# Patient Record
Sex: Male | Born: 1945 | Race: White | Hispanic: No | Marital: Married | Smoking: Never smoker
Health system: Southern US, Community
[De-identification: ages and names within clinical notes are randomized; demographics above are authoritative.]

## PROBLEM LIST (undated history)

## (undated) DIAGNOSIS — K219 Gastro-esophageal reflux disease without esophagitis: Secondary | ICD-10-CM

## (undated) DIAGNOSIS — R569 Unspecified convulsions: Secondary | ICD-10-CM

## (undated) DIAGNOSIS — H269 Unspecified cataract: Secondary | ICD-10-CM

## (undated) DIAGNOSIS — T7840XA Allergy, unspecified, initial encounter: Secondary | ICD-10-CM

## (undated) DIAGNOSIS — H409 Unspecified glaucoma: Secondary | ICD-10-CM

## (undated) DIAGNOSIS — H3530 Unspecified macular degeneration: Secondary | ICD-10-CM

## (undated) DIAGNOSIS — Z973 Presence of spectacles and contact lenses: Secondary | ICD-10-CM

## (undated) DIAGNOSIS — Z9289 Personal history of other medical treatment: Secondary | ICD-10-CM

## (undated) DIAGNOSIS — E785 Hyperlipidemia, unspecified: Secondary | ICD-10-CM

## (undated) DIAGNOSIS — M1990 Unspecified osteoarthritis, unspecified site: Secondary | ICD-10-CM

## (undated) DIAGNOSIS — I639 Cerebral infarction, unspecified: Secondary | ICD-10-CM

## (undated) DIAGNOSIS — Z923 Personal history of irradiation: Secondary | ICD-10-CM

## (undated) HISTORY — PX: BRAIN SURGERY: SHX531

## (undated) HISTORY — PX: TONSILLECTOMY: SUR1361

## (undated) HISTORY — PX: COLONOSCOPY: SHX174

## (undated) HISTORY — PX: PILONIDAL CYST EXCISION: SHX744

## (undated) HISTORY — DX: Unspecified convulsions: R56.9

## (undated) HISTORY — DX: Allergy, unspecified, initial encounter: T78.40XA

## (undated) HISTORY — DX: Unspecified cataract: H26.9

## (undated) HISTORY — PX: VASECTOMY: SHX75

## (undated) HISTORY — PX: EYE SURGERY: SHX253

## (undated) HISTORY — DX: Hyperlipidemia, unspecified: E78.5

## (undated) HISTORY — PX: DIAGNOSTIC LAPAROSCOPY: SUR761

---

## 2005-10-13 ENCOUNTER — Ambulatory Visit: Payer: Self-pay | Admitting: Internal Medicine

## 2006-12-22 HISTORY — PX: CHOLECYSTECTOMY: SHX55

## 2007-09-01 ENCOUNTER — Ambulatory Visit: Payer: Self-pay | Admitting: Internal Medicine

## 2007-09-01 DIAGNOSIS — K759 Inflammatory liver disease, unspecified: Secondary | ICD-10-CM | POA: Insufficient documentation

## 2007-09-01 DIAGNOSIS — R1013 Epigastric pain: Secondary | ICD-10-CM | POA: Insufficient documentation

## 2007-09-01 LAB — CONVERTED CEMR LAB
HCV Ab: NEGATIVE
Hep A IgM: NEGATIVE
Hep B C IgM: NEGATIVE
Hepatitis B Surface Ag: NEGATIVE

## 2007-09-02 ENCOUNTER — Encounter: Payer: Self-pay | Admitting: Internal Medicine

## 2007-09-03 ENCOUNTER — Ambulatory Visit: Payer: Self-pay | Admitting: Internal Medicine

## 2007-09-03 ENCOUNTER — Telehealth: Payer: Self-pay | Admitting: Internal Medicine

## 2007-09-03 LAB — CONVERTED CEMR LAB
ALT: 753 units/L — ABNORMAL HIGH (ref 0–53)
AST: 239 units/L — ABNORMAL HIGH (ref 0–37)
Albumin: 3.9 g/dL (ref 3.5–5.2)
Alkaline Phosphatase: 201 units/L — ABNORMAL HIGH (ref 39–117)
BUN: 9 mg/dL (ref 6–23)
Basophils Relative: 0.4 % (ref 0.0–1.0)
Bilirubin, Direct: 4.4 mg/dL — ABNORMAL HIGH (ref 0.0–0.3)
CO2: 28 meq/L (ref 19–32)
Calcium: 9.4 mg/dL (ref 8.4–10.5)
Chloride: 104 meq/L (ref 96–112)
Creatinine, Ser: 0.8 mg/dL (ref 0.4–1.5)
Eosinophils Relative: 1.1 % (ref 0.0–5.0)
GFR calc Af Amer: 127 mL/min
GFR calc non Af Amer: 105 mL/min
Glucose, Bld: 106 mg/dL — ABNORMAL HIGH (ref 70–99)
HCT: 45.3 % (ref 39.0–52.0)
Hemoglobin: 15.4 g/dL (ref 13.0–17.0)
Lymphocytes Relative: 20.8 % (ref 12.0–46.0)
MCHC: 34 g/dL (ref 30.0–36.0)
MCV: 90.4 fL (ref 78.0–100.0)
Monocytes Relative: 9.1 % (ref 3.0–11.0)
Neutrophils Relative %: 68.6 % (ref 43.0–77.0)
Platelets: 171 10*3/uL (ref 150–400)
Potassium: 3.7 meq/L (ref 3.5–5.1)
RBC: 5.01 M/uL (ref 4.22–5.81)
RDW: 12.6 % (ref 11.5–14.6)
Sodium: 140 meq/L (ref 135–145)
Total Bilirubin: 7.1 mg/dL — ABNORMAL HIGH (ref 0.3–1.2)
Total Protein: 7 g/dL (ref 6.0–8.3)
WBC: 6.3 10*3/uL (ref 4.5–10.5)

## 2007-09-04 LAB — CONVERTED CEMR LAB
ALT: 576 units/L — ABNORMAL HIGH (ref 0–53)
AST: 182 units/L — ABNORMAL HIGH (ref 0–37)
Albumin: 3.7 g/dL (ref 3.5–5.2)
Alkaline Phosphatase: 217 units/L — ABNORMAL HIGH (ref 39–117)
Bilirubin, Direct: 2.4 mg/dL — ABNORMAL HIGH (ref 0.0–0.3)
Cholesterol: 238 mg/dL (ref 0–200)
Direct LDL: 155.4 mg/dL
HDL: 54.8 mg/dL (ref >39.0)
Total Bilirubin: 4.9 mg/dL — ABNORMAL HIGH (ref 0.3–1.2)
Total CHOL/HDL Ratio: 4.3
Total Protein: 6.8 g/dL (ref 6.0–8.3)
Triglycerides: 95 mg/dL (ref 0–149)
VLDL: 19 mg/dL (ref 0–40)

## 2007-09-06 ENCOUNTER — Encounter (INDEPENDENT_AMBULATORY_CARE_PROVIDER_SITE_OTHER): Payer: Self-pay | Admitting: *Deleted

## 2007-09-06 ENCOUNTER — Telehealth: Payer: Self-pay | Admitting: Internal Medicine

## 2007-09-06 ENCOUNTER — Ambulatory Visit: Payer: Self-pay | Admitting: Internal Medicine

## 2007-09-06 DIAGNOSIS — R740 Nonspecific elevation of levels of transaminase and lactic acid dehydrogenase [LDH]: Secondary | ICD-10-CM

## 2007-09-06 DIAGNOSIS — R7402 Elevation of levels of lactic acid dehydrogenase (LDH): Secondary | ICD-10-CM | POA: Insufficient documentation

## 2007-09-06 DIAGNOSIS — R7401 Elevation of levels of liver transaminase levels: Secondary | ICD-10-CM | POA: Insufficient documentation

## 2007-09-06 LAB — CONVERTED CEMR LAB
ALT: 700 units/L — ABNORMAL HIGH (ref 0–53)
AST: 258 units/L — ABNORMAL HIGH (ref 0–37)
Albumin: 3.7 g/dL (ref 3.5–5.2)
Alkaline Phosphatase: 241 units/L — ABNORMAL HIGH (ref 39–117)
Bilirubin, Direct: 1.7 mg/dL — ABNORMAL HIGH (ref 0.0–0.3)
Total Bilirubin: 4.3 mg/dL — ABNORMAL HIGH (ref 0.3–1.2)
Total Protein: 6.8 g/dL (ref 6.0–8.3)

## 2007-09-17 ENCOUNTER — Encounter: Admission: RE | Admit: 2007-09-17 | Discharge: 2007-09-17 | Payer: Self-pay | Admitting: Family Medicine

## 2007-09-20 ENCOUNTER — Telehealth (INDEPENDENT_AMBULATORY_CARE_PROVIDER_SITE_OTHER): Payer: Self-pay | Admitting: *Deleted

## 2007-09-20 ENCOUNTER — Encounter (INDEPENDENT_AMBULATORY_CARE_PROVIDER_SITE_OTHER): Payer: Self-pay | Admitting: *Deleted

## 2007-09-22 ENCOUNTER — Ambulatory Visit: Payer: Self-pay | Admitting: Internal Medicine

## 2007-09-22 LAB — CONVERTED CEMR LAB
ALT: 56 units/L — ABNORMAL HIGH (ref 0–53)
AST: 25 units/L (ref 0–37)
Albumin: 3.9 g/dL (ref 3.5–5.2)
Alkaline Phosphatase: 111 units/L (ref 39–117)
Bilirubin, Direct: 0.5 mg/dL — ABNORMAL HIGH (ref 0.0–0.3)
Total Bilirubin: 1.4 mg/dL — ABNORMAL HIGH (ref 0.3–1.2)
Total Protein: 6.8 g/dL (ref 6.0–8.3)

## 2007-10-01 ENCOUNTER — Telehealth (INDEPENDENT_AMBULATORY_CARE_PROVIDER_SITE_OTHER): Payer: Self-pay | Admitting: *Deleted

## 2007-10-01 DIAGNOSIS — K8020 Calculus of gallbladder without cholecystitis without obstruction: Secondary | ICD-10-CM | POA: Insufficient documentation

## 2007-11-16 ENCOUNTER — Encounter (INDEPENDENT_AMBULATORY_CARE_PROVIDER_SITE_OTHER): Payer: Self-pay | Admitting: General Surgery

## 2007-11-17 ENCOUNTER — Inpatient Hospital Stay (HOSPITAL_COMMUNITY): Admission: AD | Admit: 2007-11-17 | Discharge: 2007-11-18 | Payer: Self-pay | Admitting: General Surgery

## 2007-11-17 ENCOUNTER — Encounter: Payer: Self-pay | Admitting: Internal Medicine

## 2007-11-22 ENCOUNTER — Ambulatory Visit: Payer: Self-pay | Admitting: Internal Medicine

## 2007-11-30 ENCOUNTER — Encounter: Payer: Self-pay | Admitting: Internal Medicine

## 2007-11-30 ENCOUNTER — Encounter: Admission: RE | Admit: 2007-11-30 | Discharge: 2007-11-30 | Payer: Self-pay | Admitting: General Surgery

## 2007-12-10 ENCOUNTER — Encounter: Payer: Self-pay | Admitting: Internal Medicine

## 2007-12-26 ENCOUNTER — Encounter: Payer: Self-pay | Admitting: Internal Medicine

## 2007-12-26 DIAGNOSIS — R930 Abnormal findings on diagnostic imaging of skull and head, not elsewhere classified: Secondary | ICD-10-CM | POA: Insufficient documentation

## 2008-02-29 DIAGNOSIS — H3530 Unspecified macular degeneration: Secondary | ICD-10-CM | POA: Insufficient documentation

## 2008-02-29 DIAGNOSIS — T7840XA Allergy, unspecified, initial encounter: Secondary | ICD-10-CM | POA: Insufficient documentation

## 2008-05-12 ENCOUNTER — Encounter (INDEPENDENT_AMBULATORY_CARE_PROVIDER_SITE_OTHER): Payer: Self-pay | Admitting: *Deleted

## 2008-05-18 ENCOUNTER — Telehealth: Payer: Self-pay | Admitting: Internal Medicine

## 2011-01-12 ENCOUNTER — Encounter: Payer: Self-pay | Admitting: Internal Medicine

## 2011-01-21 NOTE — Progress Notes (Signed)
Summary: FYI  Phone Note Call from Patient   Caller: Patient Summary of Call: dr. Zriyah Kopplin I set the patient up for a follow up ct scan and had to leave a message on his voicemail for him to return my call. When the patient called me back he informed me that he will be moving so he was going to get his records and then have his new doctor follow up on the scan.  Initial call taken by: Charolette Child,  May 18, 2008 8:52 AM  Follow-up for Phone Call         I must concur with  his decision Follow-up by: Marga Melnick MD,  May 19, 2008 9:32 AM

## 2011-01-21 NOTE — Letter (Signed)
Summary: Results Follow up Letter  [facility] [place]  [address] [place], [place] [address]   Phone: [phone]  Fax: [phone]    09/06/2007 MRN: [id number]  James Olsen [address] [place], [place]  [address]  Dear Mr. Klausner,  The following are the results of your recent test(s):  Test         Result    Pap Smear:        Normal _____  Not Normal _____ Comments: ______________________________________________________ Cholesterol: LDL(Bad cholesterol):         Your goal is less than:         HDL (Good cholesterol):       Your goal is more than: Comments:  ______________________________________________________ Mammogram:        Normal _____  Not Normal _____ Comments:  ___________________________________________________________________ Hemoccult:        Normal _____  Not normal _______ Comments:    _____________________________________________________________________ Other Tests:  Please see attached results and comments   We routinely do not discuss normal results over the telephone.  If you desire a copy of the results, or you have any questions about this information we can discuss them at your next office visit.   Sincerely,

## 2011-01-21 NOTE — Letter (Signed)
Summary: [facility]   Imported By: Freddy Jaksch 12/13/2007 11:52:37  _____________________________________________________________________  External Attachment:    Type:   Image     Comment:   External Document  Appended Document: [facility] see CT scan 12/09/08tiny nodules present. No PMH CA &  nonsmoker. Plan repeat CT in 6/09 to verify stability.

## 2011-01-21 NOTE — Procedures (Signed)
Summary: ERCP  ERCP   Imported By: Freddy Jaksch 12/01/2007 10:37:13  _____________________________________________________________________  External Attachment:    Type:   Image     Comment:   INTER

## 2011-01-21 NOTE — Progress Notes (Signed)
Summary: ? re appt - dr hopper  Phone Note Call from Patient Call back at [phone]   Summary of Call: patient returned dr hoppers call he wants to go ahead with consult regarding findings from ultra sound - he is also scheduled to see dr Elenore Paddy & wants to know if he should keep that appt   ---- he can also be reached at [phone] ext [phone]   Initial call taken by: Okey Regal Spring,  September 20, 2007 3:16 PM  Follow-up for Phone Call        Dr Marvell Fuller opinion prior to Surgical consult would be appropriate; but I doubt non surgical approach possible for gallstones Follow-up by: Marga Melnick MD,  September 20, 2007 6:27 PM  Additional Follow-up for Phone Call Additional follow up Details #1::        CALLED AND SP / PT--TOLD HIM PER DR HOPP GI CONSULT BEFORE SURG CONSULT WOUT BE APPROPRIATE.Marland KitchenKENNETH Olsen IS OK W/ THAT....................................................................Marland KitchenDaine Gip  September 21, 2007 12:40 PM Additional Follow-up by: Daine Gip,  September 21, 2007 12:40 PM

## 2011-01-21 NOTE — Letter (Signed)
Summary: External Correspondence-note  External Correspondence-note   Imported By: Vanessa Swaziland 09/10/2007 10:48:26  _____________________________________________________________________  External Attachment:    Type:   Image     Comment:   External Document

## 2011-01-21 NOTE — Letter (Signed)
Summary: Primary Care Consult Scheduled Letter  [facility] [place]  [address] [place], [place] [address]   Phone: [phone]  Fax: [phone]      05/12/2008 MRN: [id number]  James Olsen [address] [place], [place]  [address]    Dear Mr. Lagares,      We have scheduled an appointment for you.  At the recommendation of Dr.Hopper, we have scheduled you a CT scan on June 5th at 9:00am.  Their address is [address]. The office phone number is [phone].  If this appointment day and time is not convenient for you, please feel free to call the office of the doctor you are being referred to at the number listed above and reschedule the appointment.     It is important for you to keep your scheduled appointments. We are here to make sure you are given good patient care. If you have questions or you have made changes to your appointment, please notify us at  [phone], ask for Tiffany.    Thank you,  Patient Care Coordinator [place] [facility]

## 2011-01-21 NOTE — Progress Notes (Signed)
Summary: LETTER FROM PT  Phone Note Call from Patient   Caller: Patient Summary of Call: PT CAN IN THE OFFICE THIS MORNING AND DROPPED OFF A LETTER FOR DR.Ziggy Chanthavong PULLED CHART AND GIVEN TO DR.Hanley Rispoli Initial call taken by: Vanessa Swaziland,  September 06, 2007 8:51 AM  Follow-up for Phone Call        chart put with letter and given to dr Acelyn Basham Follow-up by: Wendall Stade,  September 06, 2007 4:01 PM

## 2011-01-21 NOTE — Assessment & Plan Note (Signed)
Summary: stomach pain//tl   Vital Signs:  Patient Profile:   65 Years Old Male Weight:      201 pounds Temp:     98.2 degrees F oral BP sitting:   130 / 88  (left arm)  Pt. in pain?   no  Vitals Entered By: Doristine Devoid (September 01, 2007 10:43 AM)                  Chief Complaint:  IBS flare-up that is different and urine a little darker than normal and Abdominal pain.  History of Present Illness:  Abdominal Pain      This is a 65 year old man who presents with Abdominal pain.  nausea & ? fever 08/31/07; sx began 08/29/07; sx resolved with liquid Tylenol except for intermittent abd pain; dicyclomine & BM helped but dicyclomine year out of date.  The patient reports constipation and anorexia, but denies nausea, vomiting, diarrhea, melena, hematochezia, and hematemesis.  The location of the pain is epigastric.  The pain is described as intermittent and dull.  Associated symptoms include dark urine.  The patient denies the following symptoms: fever, weight loss, dysuria, chest pain, and jaundice.  The pain is worse with movement and lying down.  The pain is better with defecation.  Stool lighter since 08/31/07.    Past Medical History:    IBS  Past Surgical History:    Colonoscopy age 64    Pilonidal cystectomy X 2    Tonsillectomy    Vasectomy    Risk Factors:  Tobacco use:  never Alcohol use:  yes    Type:  rare Exercise:  yes    Type:  occa walk   Review of Systems  CV      Denies chest pain or discomfort, difficulty breathing at night, difficulty breathing while lying down, swelling of feet, and swelling of hands.  Resp      Denies cough, shortness of breath, and sputum productive.  GI      See HPI      Complains of change in bowel habits.      Denies yellowish skin color.  GU      Denies discharge, dysuria, hematuria, and urinary frequency.      no pyuria   Physical Exam  General:     Well-developed,well-nourished,in no acute distress;  alert,appropriate and cooperative throughout examination Eyes:     Scleral icterus Lungs:     Normal respiratory effort, chest expands symmetrically. Lungs are clear to auscultation, no crackles or wheezes. Heart:     Normal rate and regular rhythm. S1 and S2 normal without gallop, murmur, click, rub or other extra sounds. Abdomen:     Bowel sounds positive,abdomen soft and non-tender without masses, organomegaly or hernias noted. Not tender even with percussion Skin:     Slightly yellow skin(faint) Cervical Nodes:     No lymphadenopathy noted Axillary Nodes:     No palpable lymphadenopathy Psych:     normally interactive, good eye contact, and not anxious appearing.      Impression & Recommendations:  Problem # 1:  ABDOMINAL PAIN, EPIGASTRIC (ICD-789.06)  Orders: TLB-BMP (Basic Metabolic Panel-BMET) (80048-METABOL) TLB-CBC Platelet - w/Differential (85025-CBCD) TLB-Hepatic/Liver Function Pnl (80076-HEPATIC) TLB-Udip w/ Micro (81001-URINE) T-Hepatitis Acute Panel ([id number])   Problem # 2:  HEPATITIS NOS (ICD-573.3)  Orders: TLB-BMP (Basic Metabolic Panel-BMET) (80048-METABOL) TLB-CBC Platelet - w/Differential (85025-CBCD) TLB-Hepatic/Liver Function Pnl (80076-HEPATIC) TLB-Udip w/ Micro (81001-URINE) T-Hepatitis Acute Panel ([id number])    Patient Instructions:  1)  Avoid excess Tylenol, Vit A, & alcohol. Remain out work 9/ 10;11; & 12.    ]  Appended Document: stomach pain//tl   Orders Added: 1)  T-Culture, Urine [[id number]]   Laboratory Results   Urine Tests  Date/Time Recieved: ..................................................................Marland KitchenFloydene Flock  September 01, 2007 2:37 PM   Routine Urinalysis   Color: orange Appearance: Clear Glucose: negative   (Normal Range: Negative) Bilirubin: negative   (Normal Range: Negative) Ketone: negative   (Normal Range: Negative) Spec. Gravity: <1.005   (Normal Range: 1.003-1.035) Blood: negative    (Normal Range: Negative) pH: 5.0   (Normal Range: 5.0-8.0) Protein: negative   (Normal Range: Negative) Urobilinogen: negative   (Normal Range: 0-1) Nitrite: negative   (Normal Range: Negative) Leukocyte Esterace: negative   (Normal Range: Negative)    Comments: urine sent for culture

## 2011-01-21 NOTE — Letter (Signed)
Summary: Results Follow up Letter  [facility] [place]  [address] [place], [place] [address]   Phone: [phone]  Fax: [phone]    09/20/2007 MRN: [id number]  [facility] [address] [place], [place]  [address]  Dear James Olsen,  The following are the results of your recent test(s):  Test         Result    Pap Smear:        Normal _____  Not Normal _____ Comments: ______________________________________________________ Cholesterol: LDL(Bad cholesterol):         Your goal is less than:         HDL (Good cholesterol):       Your goal is more than: Comments:  ______________________________________________________ Mammogram:        Normal _____  Not Normal _____ Comments:  ___________________________________________________________________ Hemoccult:        Normal _____  Not normal _______ Comments:    _____________________________________________________________________ Other Tests:  Please see attached results and comments   We routinely do not discuss normal results over the telephone.  If you desire a copy of the results, or you have any questions about this information we can discuss them at your next office visit.   Sincerely,

## 2011-01-21 NOTE — Miscellaneous (Signed)
Summary: Orders Update  Clinical Lists Changes  Problems: Added new problem of ELEVATION, TRANSAMINASE/LDH LEVELS (ICD-790.4) Orders: Added new Referral order of Gastroenterology Referral (GI) - Signed Added new Referral order of Radiology Referral (Radiology) - Signed

## 2011-01-21 NOTE — Progress Notes (Signed)
Summary: HEPATIC TEST PREFORMED  Phone Note Outgoing Call   Call placed by: [facility] DAVIS Summary of Call: Home# [phone] THE HEPATIC TEST WAS PREFORMED- NO RESULTS AS OF YET.  PT CALLED THE OFFICE AT 4:00PM TO SEE IF WE HAVE RESULTS- TOLD PT IS ANY PROBLEM, DR Emmilia Sowder WILL CALL.  TOLD PT IF HE NEED AN OV- SOMEONE WOULD BE IN CONTACT W/ HIM.  PT SAYS IF NOT AVAIL TO LEAVE MESSAGE ON ANS [facility] AT HOME # Initial call taken by: Daine Gip,  September 03, 2007 5:00 PM  Follow-up for Phone Call        3:21 am I'll call as soon as results back Follow-up by: Marga Melnick MD,  September 04, 2007 3:21 AM

## 2011-01-21 NOTE — Miscellaneous (Signed)
Summary: Orders Update  Clinical Lists Changes  Problems: Added new problem of NONSPECIFIC ABNORM FIND RAD&OTH EXAM LUNG FIELD (ICD-793.1) Orders: Added new Test order of CT without Contrast (CT w/o contrast) - Signed

## 2011-01-21 NOTE — Progress Notes (Signed)
Summary: gallbladder surg consult-DR HOPPER SEE PLEASE  Phone Note Call from Patient Call back at Home Phone [phone]   Caller: Patient Summary of Call: patient was under the assumption he was going to be scheduled or referred to a surgeon for galbladder consult  Initial call taken by: Okey Regal Spring,  October 01, 2007 9:54 AM  Follow-up for Phone Call        spoke with pt said gallbladder consult was discussed and pt wants to have it. saw dr Leone Payor 09/22/07 Follow-up by: Kandice Hams,  October 01, 2007 10:46 AM  Additional Follow-up for Phone Call Additional follow up Details #1::        Appt Scheduled Additional Follow-up by: Gwen Pounds,  October 01, 2007 3:26 PM  New Problems: GALLSTONES (ICD-574.20)   New Problems: GALLSTONES (ICD-574.20)

## 2011-05-06 NOTE — Op Note (Signed)
James Olsen, MCNIEL NO.:  [id number]   MEDICAL RECORD NO.:  [id number]          PATIENT TYPE:  OIB   LOCATION:  5727                         FACILITY:  [facility]   PHYSICIAN:  Cherylynn Ridges, M.D.    DATE OF BIRTH:  08-May-1946   DATE OF PROCEDURE:  11/16/2007  DATE OF DISCHARGE:                               OPERATIVE REPORT   PREOPERATIVE DIAGNOSIS:  Symptomatic gallstones with  choledocholithiasis.   POSTOPERATIVE DIAGNOSIS:  Chronic cholecystitis, cholelithiasis and  choledocholithiasis.   PROCEDURE:  Laparoscopic cholecystectomy with intraoperative  cholangiogram.   SURGEON:  Cherylynn Ridges, M.D.   ASSISTANT:  Magnus Ivan, RNFA.   ANESTHESIA:  General endotracheal.   ESTIMATED BLOOD LOSS:  Less than 30 mL.   COMPLICATIONS:  None.   CONDITION:  Stable.   SPECIMENS:  Gallbladder plus stones.   FINDINGS:  Chronic cholecystitis with common bile duct stones x1 or 2  based on intraoperative cholangiogram.   INDICATIONS FOR OPERATION:  The patient is a 65 year old who had an  episode of choledocholithiasis and elevated bilirubin and abdominal pain  whose symptoms have subsided but now comes in for an elective  laparoscopic cholecystectomy.   FINDINGS:  The patient had significant evidence of chronic cholecystitis  with inflammatory changes of the gallbladder.  The infundibulum of the  gallbladder was elongated.  The cystic duct was enlarged.  Cholangiogram  showed at least 1, maybe 2, common bile duct stones, one of which had  floated proximal to the cystic duct origin on the last cholangiogram.   OPERATION:  The patient was taken to the operating room, placed on the  table in the supine position.  After an adequate endotracheal anesthetic  was administered, he was prepped and draped in the usual sterile manner  including the midline and the right upper quadrant.   A supraumbilical curvilinear incision was made using a #11 blade and  taken down to  the midline fascia.  The fascia was exposed and a  longitudinal incision made using a #15 blade.  The edges of the fascia  were grabbed with a Kocher clamp and then we bluntly dissected down into  the peritoneal cavity using a Kelly clamp.  We then passed a pursestring  suture of 0 Vicryl around the fascial opening and then passed a Hasson  cannula into the peritoneal cavity with minimal difficulty.   Once the Hasson cannula was secured in place with the pursestring  suture, we insufflated carbon dioxide gas up to a maximal intraabdominal  pressure of 15 mmHg.  Two right costal margin 5-mm cannulas and a  subxiphoid 11-12 mm cannula were passed under direct vision.  With all  cannulas in place, the patient was placed in reverse Trendelenburg.  The  left side was tilted down.   There were significant adhesions to the body of the gallbladder as we  retracted it towards the right upper quadrant and the anterior abdominal  wall.  These were bluntly dissected away with a grasper and also  electrocautery, exposing the initial portion of the infundibulum.  We  dissected out this portion of  the infundibulum.  Once we had gotten  circumferentially around it, we ligated what was thought to be a cystic  artery in the window of the triangle of Calot and hepatoduodenal  triangle.  We then placed a clip on the gallbladder side and made a  cholecystocolotomy and then passed a Cook catheter into the  cholecystocolotomy and performed a cholangiogram.  This showed that we  were actually high up on the gallbladder into the distal portion of the  infundibulum with flow into the cystic duct where there appeared to be  an entrapped cystic duct stone.   Once this cholangiogram was complete, we removed the catheter.  Then we  dissected more distally down the infundibulum onto the proximal cystic  duct.  We were able to find a more adequate window proximal to the  common bile duct and more distal on the  infundibulum.  However, at that  point the cystic duct was still enlarged and big.  We placed the clip  again along the gallbladder side and made a cholecystocolotomy and then  performed a cholangiogram again with the [facility] catheter, which showed  again what appeared to be a common bile duct stone which floated into  the distal common bile duct.  We then removed the [facility] catheter and  passed a red rubber catheter through an angiocatheter to the anterior  abdominal wall.  I attempted to retrieve the stone by inflating the  balloon in the cystic duct and the common bile duct.  We were able to  pass the stone proximal to the junction of the cystic duct.  We could  not bring it out through the cystic duct itself.  An intraoperative  consultation from Dr. Jeani Hawking, gastroenterologist, was made and he  will likely do a postoperative ERCP.   Once we have completed the last cholangiogram with the red rubber  catheter in place which showed good flow into the duodenum and a  floating common bile duct stone, we removed the catheter and placed 2  clips along the distal cystic duct.  However, because of its enlarged  size, we did use an Endoloop distal to the clips in order to secure the  cystic duct opening and prevent a leak.  No drains were left in place.  We then used an endo hook to dissect out the gallbladder from its bed  with minimal difficulty and retreated from the supraumbilical site with  an EndoCatch bag.  We irrigated with about 1.5-2 L of warm saline  solution where there was no evidence of any bile leakage.  No bleeding  from the gallbladder bed.  All counts were correct.  We aspirated all  fluid and gas from about the liver as we removed all cannulas.   The supraumbilical fascial site was closed using pursestring suture  which was in place.  Then the skin at the supraumbilical and subxiphoid  sites were closed using running subcuticular stitch of 4-0 Vicryl.  Dermabond was  applied at the skin in all levels and closing the lateral  cannula sites.  Steri-Strips and Tegaderm were used on the skin.  All  needle counts, sponge counts and instrument counts were correct.      Cherylynn Ridges, M.D.  Electronically Signed     JOW/MEDQ  D:  11/16/2007  T:  11/17/2007  Job:  [id number]   cc:   Dr. Doran Stabler D. Elnoria Howard, MD

## 2011-05-06 NOTE — Assessment & Plan Note (Signed)
[facility]                         GASTROENTEROLOGY OFFICE NOTE   SEVAN, MCBROOM                    MRN:          [id number]  DATE:09/22/2007                            DOB:          1946/10/25    REFERRING PHYSICIAN:  Titus Dubin. Hopper, MD,FACP,FCCP   REASON FOR CONSULTATION:  Abdominal pain and fluctuating liver function  tests.   ASSESSMENT:  A 65 year old white man who has had spells of abdominal  pain in the epigastrium off and on for a number of years.  More  recently, he developed jaundice, light-colored stools, and dark urine  and was found to have gallstones on an ultrasound.  Hepatitis screening  was negative, he tells me.   I think his problem is symptomatic cholelithiasis.  His common bile duct  is about 7 mm.  It is possible he had choledocholithiasis on top of his  cholelithiasis.  He is improved at this time.   PLAN:  1. Agree with surgical consultation that has been ordered.  2. Recheck LFTs.  3. If his LFTs remain up, it may be that he needs a preoperative ERCP,      depending upon what the surgeon thinks.  4. He should have a colonoscopy in October 2009 which is about 10      years from the time of his last one (for screening).   I did explain the situation to him and gave him handouts regarding  gallstones, laparoscopic cholecystectomy, and endoscopic retrograde  cholangiopancreatography.  I have reviewed the risks, benefits, and  indications of ERCP as well.   HISTORY:  As above.  He saw Dr. Alwyn Ren recently after developing some  signs and symptoms of jaundice.  Please see my medical history form for  full details.  He developed the relatively sudden onset of upper  abdominal pain similar to but not quite the same as things he has had  off and on over the years that have been diagnosed as irritable bowel  syndrome.  He had a bilirubin of 4.3, an ALT of 700, alk phos 241, AST  248.  He has not really ever had  jaundice before.  The ultrasound of the  abdomen was ordered.  I reviewed that report, and it demonstrates  multiple mobile gallstones, borderline thickened gallbladder wall, no  Murphy sign, and a borderline dilated common bile duct with slight  intrahepatic biliary ductal prominence at 7 mm.  He does have  echogenicity consistent with fatty liver.  Again, he is back to normal  at this time.   MEDICATIONS:  Vitamins for macular degeneration daily.   No known drug allergies   PAST MEDICAL HISTORY:  1. Macular degeneration.  2. Allergies.  3. Pilonidal cyst surgery.  4. Tonsillectomy.  5. Vasectomy.   FAMILY HISTORY:  Diabetes and breast cancer in his mother.  No colon  cancer or other problems.   SOCIAL HISTORY:  He is married.  He moved here from [place] about 4 years  ago.  He is a Loss adjuster, chartered.  About 2 alcoholic beverages a month,  nothing more.  He has a bachelor's of  fine arts.  No tobacco, no drugs.   REVIEW OF SYSTEMS:  Otherwise negative at this time.   PHYSICAL EXAM:  Height 5 feet 9 inches, weight 199 pounds, blood  pressure 112/78, pulse 76.  EYES:  Anicteric.  ENT:  Normal mouth, posterior pharynx.  NECK:  Supple, no thyromegaly or mass.  CHEST:  Clear.  HEART:  S1, S2.  I hear no murmurs, rubs, or gallops.  ABDOMEN:  Soft, nontender.  No organomegaly or mass.  LYMPHATICS:  No neck or supraclavicular nodes.  EXTREMITIES:  No edema.  SKIN:  No rash.  PSYCH:  He is alert and oriented x3.   I have reviewed the ultrasound report, the labs, Dr. Frederik Pear office  notes.   I appreciate the opportunity to care for this patient.     Iva Boop, MD,FACG  Electronically Signed    CEG/MedQ  DD: 09/22/2007  DT: 09/23/2007  Job #: [id number]   cc:   Titus Dubin. Alwyn Ren, MD,FACP,FCCP  [facility]

## 2011-09-30 LAB — COMPREHENSIVE METABOLIC PANEL
ALT: 145 — ABNORMAL HIGH
ALT: 25
AST: 20
AST: 62 — ABNORMAL HIGH
Albumin: 3 — ABNORMAL LOW
Albumin: 4.1
Alkaline Phosphatase: 74
Alkaline Phosphatase: 76
BUN: 14
BUN: 7
CO2: 27
CO2: 29
Calcium: 8.7
Calcium: 9.6
Chloride: 103
Chloride: 104
Creatinine, Ser: 0.73
Creatinine, Ser: 0.86
GFR calc Af Amer: >60
GFR calc Af Amer: >60
GFR calc non Af Amer: >60
GFR calc non Af Amer: >60
Glucose, Bld: 120 — ABNORMAL HIGH
Glucose, Bld: 94
Potassium: 3.5
Potassium: 4
Sodium: 138
Sodium: 139
Total Bilirubin: 0.8
Total Bilirubin: 0.9
Total Protein: 5.3 — ABNORMAL LOW
Total Protein: 6.5

## 2011-09-30 LAB — CBC
HCT: 37.8 — ABNORMAL LOW
HCT: 44.6
Hemoglobin: 12.9 — ABNORMAL LOW
Hemoglobin: 15.3
MCHC: 34.2
MCHC: 34.2
MCV: 88.6
MCV: 89.5
Platelets: 154
Platelets: 187
RBC: 4.22
RBC: 5.04
RDW: 12.8
RDW: 13.3
WBC: 5.6
WBC: 7.6

## 2011-09-30 LAB — DIFFERENTIAL
Basophils Absolute: 0
Basophils Absolute: 0
Basophils Relative: 0
Basophils Relative: 1
Eosinophils Absolute: 0.1 — ABNORMAL LOW
Eosinophils Absolute: 0.1 — ABNORMAL LOW
Eosinophils Relative: 1
Eosinophils Relative: 3
Lymphocytes Relative: 15
Lymphocytes Relative: 36
Lymphs Abs: 1.1
Lymphs Abs: 2
Monocytes Absolute: 0.6
Monocytes Absolute: 0.7
Monocytes Relative: 11
Monocytes Relative: 9
Neutro Abs: 2.8
Neutro Abs: 5.7
Neutrophils Relative %: 50
Neutrophils Relative %: 75

## 2014-04-28 ENCOUNTER — Other Ambulatory Visit: Payer: Self-pay | Admitting: Orthopedic Surgery

## 2014-05-02 ENCOUNTER — Encounter (HOSPITAL_BASED_OUTPATIENT_CLINIC_OR_DEPARTMENT_OTHER): Payer: Self-pay | Admitting: *Deleted

## 2014-05-03 NOTE — H&P (Signed)
  James Olsen is an 68 y.o. male.   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  This was treated in your office with digital block, debridement and attempted nail ablation.  James Olsen has gone on to heal his wound, but has an irregular nail remnant which is bothersome to him.     Past Medical History  Diagnosis Date  . Glaucoma   . Arthritis   . GERD (gastroesophageal reflux disease)   . Wears glasses     Past Surgical History  Procedure Laterality Date  . Cholecystectomy  2008    lap choli  . Vasotomy    . Pilonidal cyst excision      x2  . Colonoscopy    . Tonsillectomy      History reviewed. No pertinent family history. Social History:  reports that he has never smoked. He does not have any smokeless tobacco history on file. He reports that he drinks alcohol. He reports that he does not use illicit drugs.  Allergies: No Known Allergies  No prescriptions prior to admission    No results found for this or any previous visit (from the past 48 hour(s)).  No results found.   Pertinent items are noted in HPI.  Height 5' 9.5" (1.765 m), weight 88.451 kg (195 lb).  General appearance: alert Head: Normocephalic, without obvious abnormality Neck: supple, symmetrical, trachea midline Resp: clear to auscultation bilaterally Cardio: regular rate and rhythm GI: normal findings: bowel sounds normal Extremities:  Inspection of his finger reveals an irregular tip with bulbous skin flaps radial and ulnar.  He has about 25% of his nail plate remaining.  It is quite irregular and projecting above the nail fold.  He frequently snags this on his sleeves. He has active motion of the DIP joint, 0 to 30 degrees. He still has some flexor tendon attachment and has normal extensor attachment.  He has full range  of motion of his PIP and MP.  His sensibility is returning to the fingertip.   X-rays of his finger demonstrate that he has lost the distal diaphysis, metaphysis and tuft of his distal phalanx.  He has good skin coverage.     Pulses: 2+ and symmetric Skin: normal Neurologic: Grossly normal    Assessment/Plan Impression:  S/P partial amputation tip left index finger.  Plan: To the OR for left index finger nail ablation with V-Y flap coverage.The procedure, risks,benefits and post-op course were discussed with the patient at length and they were in agreement with the plan.  Marily Lente Dasnoit 05/03/2014, 3:21 PM  H&P documentation: 05/04/2014  -History and Physical Reviewed  -Patient has been re-examined  -No change in the plan of care  [name], MD

## 2014-05-04 ENCOUNTER — Encounter (HOSPITAL_BASED_OUTPATIENT_CLINIC_OR_DEPARTMENT_OTHER)
Admission: RE | Disposition: A | Discharge status: Home / Self Care | Payer: Self-pay | Source: Ambulatory Visit | Attending: Orthopedic Surgery

## 2014-05-04 ENCOUNTER — Encounter (HOSPITAL_BASED_OUTPATIENT_CLINIC_OR_DEPARTMENT_OTHER): Payer: Self-pay

## 2014-05-04 ENCOUNTER — Ambulatory Visit (HOSPITAL_BASED_OUTPATIENT_CLINIC_OR_DEPARTMENT_OTHER): Payer: Medicare Other | Admitting: Anesthesiology

## 2014-05-04 ENCOUNTER — Encounter (HOSPITAL_BASED_OUTPATIENT_CLINIC_OR_DEPARTMENT_OTHER): Payer: Medicare Other | Admitting: Anesthesiology

## 2014-05-04 ENCOUNTER — Ambulatory Visit (HOSPITAL_BASED_OUTPATIENT_CLINIC_OR_DEPARTMENT_OTHER)
Admission: RE | Admit: 2014-05-04 | Discharge: 2014-05-04 | Disposition: A | Discharge status: Home / Self Care | Payer: Medicare Other | Source: Ambulatory Visit | Attending: Orthopedic Surgery | Admitting: Orthopedic Surgery

## 2014-05-04 DIAGNOSIS — H409 Unspecified glaucoma: Secondary | ICD-10-CM | POA: Insufficient documentation

## 2014-05-04 DIAGNOSIS — K219 Gastro-esophageal reflux disease without esophagitis: Secondary | ICD-10-CM | POA: Insufficient documentation

## 2014-05-04 DIAGNOSIS — M129 Arthropathy, unspecified: Secondary | ICD-10-CM | POA: Insufficient documentation

## 2014-05-04 DIAGNOSIS — L608 Other nail disorders: Secondary | ICD-10-CM | POA: Insufficient documentation

## 2014-05-04 HISTORY — PX: NAILBED REPAIR: SHX5028

## 2014-05-04 HISTORY — DX: Presence of spectacles and contact lenses: Z97.3

## 2014-05-04 HISTORY — DX: Unspecified osteoarthritis, unspecified site: M19.90

## 2014-05-04 HISTORY — DX: Gastro-esophageal reflux disease without esophagitis: K21.9

## 2014-05-04 HISTORY — DX: Unspecified glaucoma: H40.9

## 2014-05-04 SURGERY — REPAIR, NAIL BED
Anesthesia: Monitor Anesthesia Care | Site: Finger | Laterality: Left

## 2014-05-04 MED ORDER — MIDAZOLAM HCL 2 MG/2ML IJ SOLN
INTRAMUSCULAR | Status: AC
  Filled 2014-05-04: qty 2

## 2014-05-04 MED ORDER — MEPERIDINE HCL 25 MG/ML IJ SOLN: 6.2500 mg | INTRAMUSCULAR | Status: DC | PRN

## 2014-05-04 MED ORDER — HYDROMORPHONE HCL PF 1 MG/ML IJ SOLN: 0.2500 mg | INTRAMUSCULAR | Status: DC | PRN

## 2014-05-04 MED ORDER — CHLORHEXIDINE GLUCONATE 4 % EX LIQD: 60.0000 mL | Freq: Once | CUTANEOUS | Status: DC

## 2014-05-04 MED ORDER — MIDAZOLAM HCL 2 MG/2ML IJ SOLN: 1.0000 mg | INTRAMUSCULAR | Status: DC | PRN

## 2014-05-04 MED ORDER — ONDANSETRON HCL 4 MG/2ML IJ SOLN: 4.0000 mg | Freq: Once | INTRAMUSCULAR | Status: DC | PRN

## 2014-05-04 MED ORDER — OXYCODONE HCL 5 MG PO TABS: 5.0000 mg | ORAL_TABLET | Freq: Once | ORAL | Status: DC | PRN

## 2014-05-04 MED ORDER — FENTANYL CITRATE 0.05 MG/ML IJ SOLN
INTRAMUSCULAR | Status: AC
  Filled 2014-05-04: qty 2

## 2014-05-04 MED ORDER — CEPHALEXIN 500 MG PO CAPS: 500.0000 mg | ORAL_CAPSULE | Freq: Three times a day (TID) | ORAL | Status: DC

## 2014-05-04 MED ORDER — FENTANYL CITRATE 0.05 MG/ML IJ SOLN
INTRAMUSCULAR | Status: DC | PRN
  Administered 2014-05-04: 100 ug via INTRAVENOUS

## 2014-05-04 MED ORDER — FENTANYL CITRATE 0.05 MG/ML IJ SOLN: 50.0000 ug | INTRAMUSCULAR | Status: DC | PRN

## 2014-05-04 MED ORDER — PROPOFOL INFUSION 10 MG/ML OPTIME
INTRAVENOUS | Status: DC | PRN
  Administered 2014-05-04: 100 ug/kg/min via INTRAVENOUS

## 2014-05-04 MED ORDER — MIDAZOLAM HCL 5 MG/5ML IJ SOLN
INTRAMUSCULAR | Status: DC | PRN
  Administered 2014-05-04: 2 mg via INTRAVENOUS

## 2014-05-04 MED ORDER — ONDANSETRON HCL 4 MG/2ML IJ SOLN
INTRAMUSCULAR | Status: DC | PRN
  Administered 2014-05-04: 4 mg via INTRAVENOUS

## 2014-05-04 MED ORDER — LACTATED RINGERS IV SOLN
INTRAVENOUS | Status: DC
  Administered 2014-05-04: 09:00:00 via INTRAVENOUS

## 2014-05-04 MED ORDER — LIDOCAINE HCL (PF) 1 % IJ SOLN
INTRAMUSCULAR | Status: DC | PRN
  Administered 2014-05-04: 4 mL

## 2014-05-04 MED ORDER — OXYCODONE-ACETAMINOPHEN 5-325 MG PO TABS: ORAL_TABLET | ORAL | Status: DC

## 2014-05-04 MED ORDER — OXYCODONE HCL 5 MG/5ML PO SOLN: 5.0000 mg | Freq: Once | ORAL | Status: DC | PRN

## 2014-05-04 SURGICAL SUPPLY — 56 items
BANDAGE ADH SHEER 1  50/CT (GAUZE/BANDAGES/DRESSINGS) IMPLANT
BANDAGE COBAN STERILE 2 (GAUZE/BANDAGES/DRESSINGS) ×2 IMPLANT
BLADE 15 SAFETY STRL DISP (BLADE) IMPLANT
BLADE MINI RND TIP GREEN BEAV (BLADE) IMPLANT
BLADE SURG 15 STRL LF DISP TIS (BLADE) ×2 IMPLANT
BLADE SURG 15 STRL SS (BLADE) ×2
BNDG COHESIVE 1X5 TAN STRL LF (GAUZE/BANDAGES/DRESSINGS) ×2 IMPLANT
BNDG CONFORM 2 STRL LF (GAUZE/BANDAGES/DRESSINGS) ×2 IMPLANT
BNDG CONFORM 3 STRL LF (GAUZE/BANDAGES/DRESSINGS) IMPLANT
BNDG ESMARK 4X9 LF (GAUZE/BANDAGES/DRESSINGS) ×2 IMPLANT
BRUSH SCRUB EZ PLAIN DRY (MISCELLANEOUS) ×2 IMPLANT
CORDS BIPOLAR (ELECTRODE) ×2 IMPLANT
COVER MAYO STAND STRL (DRAPES) ×2 IMPLANT
COVER TABLE BACK 60X90 (DRAPES) ×2 IMPLANT
CUFF TOURNIQUET SINGLE 18IN (TOURNIQUET CUFF) ×2 IMPLANT
DECANTER SPIKE VIAL GLASS SM (MISCELLANEOUS) IMPLANT
DRAIN PENROSE 1/2X12 LTX STRL (WOUND CARE) ×2 IMPLANT
DRAPE EXTREMITY T 121X128X90 (DRAPE) ×2 IMPLANT
DRAPE SURG 17X23 STRL (DRAPES) ×2 IMPLANT
DRSG EMULSION OIL 3X3 NADH (GAUZE/BANDAGES/DRESSINGS) ×2 IMPLANT
GAUZE SPONGE 4X4 12PLY STRL (GAUZE/BANDAGES/DRESSINGS) IMPLANT
GAUZE XEROFORM 1X8 LF (GAUZE/BANDAGES/DRESSINGS) ×2 IMPLANT
GLOVE BIO SURGEON STRL SZ 6.5 (GLOVE) ×2 IMPLANT
GLOVE BIOGEL M STRL SZ7.5 (GLOVE) IMPLANT
GLOVE BIOGEL PI IND STRL 7.0 (GLOVE) ×1 IMPLANT
GLOVE BIOGEL PI INDICATOR 7.0 (GLOVE) ×1
GLOVE ECLIPSE 6.5 STRL STRAW (GLOVE) ×2 IMPLANT
GLOVE ORTHO TXT STRL SZ7.5 (GLOVE) ×2 IMPLANT
GOWN STRL REUS W/ TWL LRG LVL3 (GOWN DISPOSABLE) ×1 IMPLANT
GOWN STRL REUS W/ TWL XL LVL3 (GOWN DISPOSABLE) ×1 IMPLANT
GOWN STRL REUS W/TWL LRG LVL3 (GOWN DISPOSABLE) ×1
GOWN STRL REUS W/TWL XL LVL3 (GOWN DISPOSABLE) ×1
LOOP VESSEL MAXI BLUE (MISCELLANEOUS) IMPLANT
NEEDLE 27GAX1X1/2 (NEEDLE) ×2 IMPLANT
NEEDLE HYPO 25X1 1.5 SAFETY (NEEDLE) ×2 IMPLANT
NS IRRIG 1000ML POUR BTL (IV SOLUTION) ×2 IMPLANT
PACK BASIN DAY SURGERY FS (CUSTOM PROCEDURE TRAY) ×2 IMPLANT
PAD CAST 3X4 CTTN HI CHSV (CAST SUPPLIES) IMPLANT
PADDING CAST ABS 4INX4YD NS (CAST SUPPLIES)
PADDING CAST ABS COTTON 4X4 ST (CAST SUPPLIES) IMPLANT
PADDING CAST COTTON 3X4 STRL (CAST SUPPLIES)
SLEEVE SCD COMPRESS KNEE MED (MISCELLANEOUS) ×2 IMPLANT
STOCKINETTE 4X48 STRL (DRAPES) ×2 IMPLANT
SUT CHROMIC 5 0 P 3 (SUTURE) IMPLANT
SUT CHROMIC 6 0 CE2 363 13 (SUTURE) IMPLANT
SUT ETHILON 5 0 P 3 18 (SUTURE)
SUT NYLON ETHILON 5-0 P-3 1X18 (SUTURE) IMPLANT
SUT PROLENE 3 0 PS 2 (SUTURE) IMPLANT
SUT PROLENE 4 0 P 3 18 (SUTURE) ×2 IMPLANT
SUT VIC AB 4-0 P2 18 (SUTURE) IMPLANT
SYR 3ML 23GX1 SAFETY (SYRINGE) IMPLANT
SYR BULB 3OZ (MISCELLANEOUS) ×2 IMPLANT
SYR CONTROL 10ML LL (SYRINGE) ×2 IMPLANT
TOWEL OR 17X24 6PK STRL BLUE (TOWEL DISPOSABLE) ×4 IMPLANT
TRAY DSU PREP LF (CUSTOM PROCEDURE TRAY) ×2 IMPLANT
UNDERPAD 30X30 INCONTINENT (UNDERPADS AND DIAPERS) ×2 IMPLANT

## 2014-05-04 NOTE — Transfer of Care (Signed)
Immediate Anesthesia Transfer of Care Note  Patient: James Olsen  Procedure(s) Performed: Procedure(s) with comments: LEFT INDEX NAIL ABLATION V-Y FLAP COVERAGE (Left) - index  Patient Location: PACU  Anesthesia Type:MAC  Level of Consciousness: awake, alert  and oriented  Airway & Oxygen Therapy: Patient Spontanous Breathing and Patient connected to face mask oxygen  Post-op Assessment: Report given to PACU RN and Post -op Vital signs reviewed and stable  Post vital signs: Reviewed and stable  Complications: No apparent anesthesia complications

## 2014-05-04 NOTE — Anesthesia Preprocedure Evaluation (Signed)
Anesthesia Evaluation  Patient identified by MRN, date of birth, ID band Patient awake    Reviewed: Allergy & Precautions, H&P , NPO status , Patient's Chart, lab work & pertinent test results  Airway Mallampati: I TM Distance: >3 FB Neck ROM: Full    Dental   Pulmonary          Cardiovascular     Neuro/Psych    GI/Hepatic GERD-  Medicated and Controlled,(+) Hepatitis -  Endo/Other    Renal/GU      Musculoskeletal   Abdominal   Peds  Hematology   Anesthesia Other Findings   Reproductive/Obstetrics                           Anesthesia Physical Anesthesia Plan  ASA: II  Anesthesia Plan: General   Post-op Pain Management:    Induction: Intravenous  Airway Management Planned: LMA  Additional Equipment:   Intra-op Plan:   Post-operative Plan: Extubation in OR  Informed Consent: I have reviewed the patients History and Physical, chart, labs and discussed the procedure including the risks, benefits and alternatives for the proposed anesthesia with the patient or authorized representative who has indicated his/her understanding and acceptance.     Plan Discussed with: CRNA and Surgeon  Anesthesia Plan Comments:         Anesthesia Quick Evaluation

## 2014-05-04 NOTE — Anesthesia Postprocedure Evaluation (Signed)
Anesthesia Post Note  Patient: James Olsen  Procedure(s) Performed: Procedure(s) (LRB): LEFT INDEX NAIL ABLATION V-Y FLAP COVERAGE (Left)  Anesthesia type: general  Patient location: PACU  Post pain: Pain level controlled  Post assessment: Patient's Cardiovascular Status Stable  Last Vitals:  Filed Vitals:   05/04/14 1210  BP: 137/88  Pulse: 66  Temp: 36.5 C  Resp: 16    Post vital signs: Reviewed and stable  Level of consciousness: sedated  Complications: No apparent anesthesia complications

## 2014-05-04 NOTE — Brief Op Note (Signed)
05/04/2014  10:46 AM  PATIENT:  James Olsen  68 y.o. male  PRE-OPERATIVE DIAGNOSIS:  left Index nail dystrophy, painful amputation stump  POST-OPERATIVE DIAGNOSIS:  left index nail dystrophy, painful amputation stump  PROCEDURE:  Procedure(s) with comments: LEFT INDEX NAIL ABLATION V-Y FLAP COVERAGE (Left) - index  SURGEON:  Surgeon(s) and Role:    * [name]., MD - Primary  PHYSICIAN ASSISTANT:   ASSISTANTS: nurse   ANESTHESIA:   MAC  EBL:  Total I/O In: 600 [I.V.:600] Out: -   BLOOD ADMINISTERED:none  DRAINS: none   LOCAL MEDICATIONS USED:  LIDOCAINE   SPECIMEN:  No Specimen  DISPOSITION OF SPECIMEN:  N/A  COUNTS:  YES  TOURNIQUET:    DICTATION: .Other Dictation: Dictation Number [id number]  PLAN OF CARE: Discharge to home after PACU  PATIENT DISPOSITION:  PACU - hemodynamically stable.   Delay start of Pharmacological VTE agent (>24hrs) due to surgical blood loss or risk of bleeding: not applicable

## 2014-05-04 NOTE — Op Note (Signed)
[id number] 

## 2014-05-04 NOTE — Discharge Instructions (Addendum)

## 2014-05-05 NOTE — Op Note (Signed)
NAMEMarland Kitchen  Olsen, James NO.:  [id number]  MEDICAL RECORD NO.:  [id number]  LOCATION:                                 FACILITY:  PHYSICIAN:  Youlanda Mighty. Abdimalik Mayorquin, M.D.      DATE OF BIRTH:  DATE OF PROCEDURE:  05/04/2014 DATE OF DISCHARGE:                              OPERATIVE REPORT   PREOPERATIVE DIAGNOSIS:  Left index nail horn deformity status post partial amputation of left index finger in a chipper accident on December 07, 2014.  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  OPERATION: 1. Resection of dorsal, intermediate and ventral nail fold for     permanent eradication of left index finger nail. 2. Resection of redundant tissues followed by a tailored flap     advancement coverage of left index finger distal phalanx remnant.  OPERATING SURGEON:  Youlanda Mighty. Robbi Scurlock, MD.  ASSISTANT:  Nurse.  ANESTHESIA:  2% lidocaine metacarpal head level block of left index finger supplemented by IV sedation.  SUPERVISING ANESTHESIOLOGIST:  Crissie Sickles. Conrad [place], M.D.  INDICATIONS:  James Olsen is a 68 year old gentleman referred through the courtesy of Dr. Kathryne Eriksson of [facility] for management of dystrophic left index fingernail and uncomfortable pulp status post a chipper injury sustained on December 07, 2013.  Mr. James Olsen sustained a partial amputation and was treated as an outpatient by Dr. Redmond Pulling.  Under digital block anesthesia, his wound was very appropriately cleaned, debrided, and repaired.  Mr. James Olsen had some remnants of nail mechanism present and developed a nail horn that was quite problematic.  This was prominent and would snag on his sleeves when donning a shirt.  He also had discomfort where he had an invaginated hypertrophic scar at the pulp.  Dr. Redmond Pulling referred Mr. James Olsen for hand surgery consult requesting revision of the finger tip.  Mr.  James Olsen and I had a detailed informed consent in the office.  I advised him to undergo permanent eradication of the nail mechanism.  We also advised that we could tailor the pulp by scar and soft tissue rapprochement with a flap coverage of the distal phalanx.  At one point, we discussed the possibility of a V-Y advancement pedicle flap, although he appears to have adequate soft tissue to allow a simple advancement flap without creating island pedicles.  After detailed informed consent, he was brought to the operating room at this time.  Preoperatively, he was reminded of the potential risks and benefits of surgery.  He was interviewed by Dr. Conrad Blythe, who recommended IV sedation and local anesthesia.  DESCRIPTION OF PROCEDURE:  James Olsen was brought to room 1 of the [facility] and placed in a supine position on the operating table.  His left index finger and hand were marked as the proper surgical site per protocol with a marking pen in the holding area.  In room 1 under Dr. Everlene Other supervision, IV sedation was provided followed by Betadine prep of the left hand.  After routine surgical time-out, we performed a digital block of the left index finger with 5 mL of 2% plain lidocaine without epinephrine at metacarpal head  level.  Within 5 minutes, excellent anesthesia of the left index finger was noted.  Thereafter, the left hand and arm were prepped with Betadine soap and solution, sterilely draped.  A pneumatic tourniquet was placed as a p.r.n. tourniquet on the proximal brachium.  Following exsanguination of the left index finger by direct compression, a 0.5-inch Penrose drain was placed in the proximal phalangeal segment as a digital tourniquet proceeding with reconstructive surgery.  We carefully outlined the margins of the dorsal intermediate and ventral nail fold remaining and performed en bloc resection of the nail remnant down to the level of the distal  phalanx.  We then used a micro rongeur to remove any tissue that had nail bearing qualities deep to the dorsal nail fold and along the periosteum of the distal phalanx remnant.  We then incised the invaginated scar and excised the scar down to the level of the distal phalanx.  We mobilized radial and ulnar flaps and undermined them with removal of hypertrophic and soiled scar.  After tailing the flaps and removing dog ears on the corners, we were able to advance a radial and ulnar flap to the dorsal nail fold covering the distal phalanx remnant with excellent soft tissue.  A very satisfactory contour was achieved.  The flaps were inset with trauma sutures of 4-0 Prolene.  The wound was closed loosely to allow egress of hematoma from the distal phalanx.  The tourniquet was released and immediate capillary refill was noted. We held pressure on the wound for approximately 4 minutes followed by dressing the wound with Xeroflo, sterile gauze, and a wrist-based Coban dressing.  James Olsen has provided a prescription for Percocet 5 mg 1 p.o. q.4-6 hours p.r.n. pain, also based on the soiling of his wound previously, he is placed on Keflex 500 mg 1 p.o. q.8 hours x4 days as a prophylactic antibiotic.    Youlanda Mighty Sandrina Heaton, M.D.    RVS/MEDQ  D:  05/04/2014  T:  05/05/2014  Job:  [id number]

## 2014-05-08 ENCOUNTER — Encounter (HOSPITAL_BASED_OUTPATIENT_CLINIC_OR_DEPARTMENT_OTHER): Payer: Self-pay | Admitting: Orthopedic Surgery

## 2018-01-22 ENCOUNTER — Other Ambulatory Visit: Payer: Self-pay | Admitting: Family Medicine

## 2018-01-22 DIAGNOSIS — R220 Localized swelling, mass and lump, head: Secondary | ICD-10-CM

## 2018-01-25 ENCOUNTER — Ambulatory Visit
Admission: RE | Admit: 2018-01-25 | Discharge: 2018-01-25 | Disposition: A | Discharge status: Home / Self Care | Payer: Medicare Other | Source: Ambulatory Visit | Attending: Family Medicine | Admitting: Family Medicine

## 2018-01-25 DIAGNOSIS — R220 Localized swelling, mass and lump, head: Secondary | ICD-10-CM

## 2018-02-03 ENCOUNTER — Other Ambulatory Visit: Payer: Self-pay | Admitting: Family Medicine

## 2018-02-03 ENCOUNTER — Ambulatory Visit
Admission: RE | Admit: 2018-02-03 | Discharge: 2018-02-03 | Disposition: A | Discharge status: Home / Self Care | Payer: Medicare Other | Source: Ambulatory Visit | Attending: Family Medicine | Admitting: Family Medicine

## 2018-02-03 DIAGNOSIS — R220 Localized swelling, mass and lump, head: Secondary | ICD-10-CM

## 2018-02-03 MED ORDER — GADOBENATE DIMEGLUMINE 529 MG/ML IV SOLN
18.0000 mL | Freq: Once | INTRAVENOUS | Status: AC | PRN
  Administered 2018-02-03: 18 mL via INTRAVENOUS

## 2018-02-16 ENCOUNTER — Other Ambulatory Visit: Payer: Self-pay | Admitting: Neurosurgery

## 2018-02-16 DIAGNOSIS — D329 Benign neoplasm of meninges, unspecified: Secondary | ICD-10-CM

## 2018-03-02 ENCOUNTER — Ambulatory Visit
Admission: RE | Admit: 2018-03-02 | Discharge: 2018-03-02 | Disposition: A | Discharge status: Home / Self Care | Payer: Medicare Other | Source: Ambulatory Visit | Attending: Neurosurgery | Admitting: Neurosurgery

## 2018-03-02 DIAGNOSIS — D329 Benign neoplasm of meninges, unspecified: Secondary | ICD-10-CM

## 2018-03-02 MED ORDER — IOPAMIDOL (ISOVUE-300) INJECTION 61%
75.0000 mL | Freq: Once | INTRAVENOUS | Status: AC | PRN
  Administered 2018-03-02: 75 mL via INTRAVENOUS

## 2018-07-07 ENCOUNTER — Other Ambulatory Visit: Payer: Self-pay | Admitting: Neurosurgery

## 2018-07-07 DIAGNOSIS — D329 Benign neoplasm of meninges, unspecified: Secondary | ICD-10-CM

## 2018-07-15 ENCOUNTER — Ambulatory Visit
Admission: RE | Admit: 2018-07-15 | Discharge: 2018-07-15 | Disposition: A | Discharge status: Home / Self Care | Payer: Medicare Other | Source: Ambulatory Visit | Attending: Neurosurgery | Admitting: Neurosurgery

## 2018-07-15 DIAGNOSIS — D329 Benign neoplasm of meninges, unspecified: Secondary | ICD-10-CM

## 2018-07-15 MED ORDER — GADOBENATE DIMEGLUMINE 529 MG/ML IV SOLN
18.0000 mL | Freq: Once | INTRAVENOUS | Status: AC | PRN
  Administered 2018-07-15: 18 mL via INTRAVENOUS

## 2018-07-23 ENCOUNTER — Other Ambulatory Visit: Payer: Self-pay | Admitting: Neurosurgery

## 2018-08-09 NOTE — Pre-Procedure Instructions (Signed)
James Olsen  08/09/2018      [id number] [address], [place] [address] Phone: [phone] Fax: [phone]    Your procedure is scheduled on Aug.28  Report to [facility] Admitting at 9:00 A.M.  Call this number if you have problems the morning of surgery:  [phone]   Remember:  Do not eat or drink after midnight.      Take these medicines the morning of surgery with A SIP OF WATER :              Tylenol if needed              Ranitidine (zantac)                7 days prior to surgery STOP taking any Aspirin(unless otherwise instructed by your surgeon), Aleve, Naproxen, Ibuprofen, Motrin, Advil, Goody's, BC's, all herbal medications, fish oil, and all vitamins    Do not wear jewelry.  Do not wear lotions, powders, or perfumes, or deodorant.  Do not shave 48 hours prior to surgery.  Men may shave face and neck.  Do not bring valuables to the hospital.  [facility] is not responsible for any belongings or valuables.  Contacts, dentures or bridgework may not be worn into surgery.  Leave your suitcase in the car.  After surgery it may be brought to your room.  For patients admitted to the hospital, discharge time will be determined by your treatment team.  Patients discharged the day of surgery will not be allowed to drive home.    Special instructions:  [facility]- Preparing For Surgery  Before surgery, you can play an important role. Because skin is not sterile, your skin needs to be as free of germs as possible. You can reduce the number of germs on your skin by washing with CHG (chlorahexidine gluconate) Soap before surgery.  CHG is an antiseptic cleaner which kills germs and bonds with the skin to continue killing germs even after washing.    Oral Hygiene is also important to reduce your risk of infection.  Remember - BRUSH YOUR TEETH THE MORNING OF SURGERY WITH YOUR REGULAR TOOTHPASTE  Please  do not use if you have an allergy to CHG or antibacterial soaps. If your skin becomes reddened/irritated stop using the CHG.  Do not shave (including legs and underarms) for at least 48 hours prior to first CHG shower. It is OK to shave your face.  Please follow these instructions carefully.   1. Shower the NIGHT BEFORE SURGERY and the MORNING OF SURGERY with CHG.   2. If you chose to wash your hair, wash your hair first as usual with your normal shampoo.  3. After you shampoo, rinse your hair and body thoroughly to remove the shampoo.  4. Use CHG as you would any other liquid soap. You can apply CHG directly to the skin and wash gently with a scrungie or a clean washcloth.   5. Apply the CHG Soap to your body ONLY FROM THE NECK DOWN.  Do not use on open wounds or open sores. Avoid contact with your eyes, ears, mouth and genitals (private parts). Wash Face and genitals (private parts)  with your normal soap.  6. Wash thoroughly, paying special attention to the area where your surgery will be performed.  7. Thoroughly rinse your body with warm water from the neck down.  8. DO NOT shower/wash with  your normal soap after using and rinsing off the CHG Soap.  9. Pat yourself dry with a CLEAN TOWEL.  10. Wear CLEAN PAJAMAS to bed the night before surgery, wear comfortable clothes the morning of surgery  11. Place CLEAN SHEETS on your bed the night of your first shower and DO NOT SLEEP WITH PETS.    Day of Surgery:  Do not apply any deodorants/lotions.  Please wear clean clothes to the hospital/surgery center.   Remember to brush your teeth WITH YOUR REGULAR TOOTHPASTE.    Please read over the following fact sheets that you were given. Coughing and Deep Breathing and Surgical Site Infection Prevention

## 2018-08-10 ENCOUNTER — Encounter (HOSPITAL_COMMUNITY)
Admission: RE | Admit: 2018-08-10 | Discharge: 2018-08-10 | Disposition: A | Discharge status: Home / Self Care | Payer: Medicare Other | Source: Ambulatory Visit | Attending: Neurosurgery | Admitting: Neurosurgery

## 2018-08-10 ENCOUNTER — Other Ambulatory Visit: Payer: Self-pay

## 2018-08-10 ENCOUNTER — Encounter (HOSPITAL_COMMUNITY): Payer: Self-pay

## 2018-08-10 DIAGNOSIS — Z01812 Encounter for preprocedural laboratory examination: Secondary | ICD-10-CM | POA: Diagnosis present

## 2018-08-10 DIAGNOSIS — D329 Benign neoplasm of meninges, unspecified: Secondary | ICD-10-CM | POA: Insufficient documentation

## 2018-08-10 HISTORY — DX: Unspecified macular degeneration: H35.30

## 2018-08-10 LAB — CBC
HCT: 45.9 % (ref 39.0–52.0)
Hemoglobin: 14.9 g/dL (ref 13.0–17.0)
MCH: 29.9 pg (ref 26.0–34.0)
MCHC: 32.5 g/dL (ref 30.0–36.0)
MCV: 92.2 fL (ref 78.0–100.0)
Platelets: 164 10*3/uL (ref 150–400)
RBC: 4.98 MIL/uL (ref 4.22–5.81)
RDW: 12.9 % (ref 11.5–15.5)
WBC: 5.9 10*3/uL (ref 4.0–10.5)

## 2018-08-10 LAB — BASIC METABOLIC PANEL
Anion gap: 9 (ref 5–15)
BUN: 17 mg/dL (ref 8–23)
CO2: 24 mmol/L (ref 22–32)
Calcium: 9.1 mg/dL (ref 8.9–10.3)
Chloride: 106 mmol/L (ref 98–111)
Creatinine, Ser: 0.79 mg/dL (ref 0.61–1.24)
GFR calc Af Amer: >60 mL/min (ref >60)
GFR calc non Af Amer: >60 mL/min (ref >60)
Glucose, Bld: 107 mg/dL — ABNORMAL HIGH (ref 70–99)
Potassium: 4.1 mmol/L (ref 3.5–5.1)
Sodium: 139 mmol/L (ref 135–145)

## 2018-08-10 LAB — ABO/RH: ABO/RH(D): A POS

## 2018-08-10 NOTE — Progress Notes (Addendum)
PCP: Kathryne Eriksson, MD  Cardiologist: pt denies  EKG: denies past year  Stress test: pt denies  ECHO: pt denies  Cardiac Cath: pt denies  Chest x-ray: denies past year, no recent respiratory infections/complications

## 2018-08-18 ENCOUNTER — Inpatient Hospital Stay (HOSPITAL_COMMUNITY)
Admission: RE | Admit: 2018-08-18 | Discharge: 2018-08-25 | DRG: 026 | Disposition: A | Discharge status: Rehabilitation Facility | Payer: Medicare Other | Attending: Neurosurgery | Admitting: Neurosurgery

## 2018-08-18 ENCOUNTER — Encounter (HOSPITAL_COMMUNITY): Payer: Self-pay | Admitting: *Deleted

## 2018-08-18 ENCOUNTER — Inpatient Hospital Stay (HOSPITAL_COMMUNITY)
Admission: RE | Disposition: A | Discharge status: Rehabilitation Facility | Payer: Self-pay | Source: Home / Self Care | Attending: Neurosurgery

## 2018-08-18 ENCOUNTER — Inpatient Hospital Stay (HOSPITAL_COMMUNITY): Payer: Medicare Other | Admitting: Anesthesiology

## 2018-08-18 ENCOUNTER — Other Ambulatory Visit: Payer: Self-pay

## 2018-08-18 ENCOUNTER — Inpatient Hospital Stay (HOSPITAL_COMMUNITY): Payer: Medicare Other

## 2018-08-18 DIAGNOSIS — D320 Benign neoplasm of cerebral meninges: Principal | ICD-10-CM | POA: Diagnosis present

## 2018-08-18 DIAGNOSIS — D72829 Elevated white blood cell count, unspecified: Secondary | ICD-10-CM | POA: Diagnosis not present

## 2018-08-18 DIAGNOSIS — G8191 Hemiplegia, unspecified affecting right dominant side: Secondary | ICD-10-CM | POA: Diagnosis not present

## 2018-08-18 DIAGNOSIS — Z9049 Acquired absence of other specified parts of digestive tract: Secondary | ICD-10-CM | POA: Diagnosis not present

## 2018-08-18 DIAGNOSIS — K219 Gastro-esophageal reflux disease without esophagitis: Secondary | ICD-10-CM | POA: Diagnosis not present

## 2018-08-18 DIAGNOSIS — I69320 Aphasia following cerebral infarction: Secondary | ICD-10-CM | POA: Diagnosis not present

## 2018-08-18 DIAGNOSIS — Z298 Encounter for other specified prophylactic measures: Secondary | ICD-10-CM | POA: Diagnosis not present

## 2018-08-18 DIAGNOSIS — D62 Acute posthemorrhagic anemia: Secondary | ICD-10-CM | POA: Diagnosis not present

## 2018-08-18 DIAGNOSIS — E876 Hypokalemia: Secondary | ICD-10-CM | POA: Diagnosis present

## 2018-08-18 DIAGNOSIS — H409 Unspecified glaucoma: Secondary | ICD-10-CM | POA: Diagnosis not present

## 2018-08-18 DIAGNOSIS — Z23 Encounter for immunization: Secondary | ICD-10-CM | POA: Diagnosis present

## 2018-08-18 DIAGNOSIS — D72828 Other elevated white blood cell count: Secondary | ICD-10-CM | POA: Diagnosis present

## 2018-08-18 DIAGNOSIS — H3530 Unspecified macular degeneration: Secondary | ICD-10-CM | POA: Diagnosis not present

## 2018-08-18 DIAGNOSIS — D499 Neoplasm of unspecified behavior of unspecified site: Secondary | ICD-10-CM | POA: Diagnosis not present

## 2018-08-18 DIAGNOSIS — I63522 Cerebral infarction due to unspecified occlusion or stenosis of left anterior cerebral artery: Secondary | ICD-10-CM | POA: Diagnosis not present

## 2018-08-18 DIAGNOSIS — D696 Thrombocytopenia, unspecified: Secondary | ICD-10-CM

## 2018-08-18 DIAGNOSIS — Z79899 Other long term (current) drug therapy: Secondary | ICD-10-CM | POA: Diagnosis not present

## 2018-08-18 DIAGNOSIS — M159 Polyosteoarthritis, unspecified: Secondary | ICD-10-CM | POA: Diagnosis not present

## 2018-08-18 DIAGNOSIS — Z973 Presence of spectacles and contact lenses: Secondary | ICD-10-CM | POA: Diagnosis not present

## 2018-08-18 DIAGNOSIS — I69351 Hemiplegia and hemiparesis following cerebral infarction affecting right dominant side: Secondary | ICD-10-CM

## 2018-08-18 DIAGNOSIS — I639 Cerebral infarction, unspecified: Secondary | ICD-10-CM | POA: Diagnosis not present

## 2018-08-18 DIAGNOSIS — I6389 Other cerebral infarction: Secondary | ICD-10-CM | POA: Diagnosis not present

## 2018-08-18 DIAGNOSIS — R4701 Aphasia: Secondary | ICD-10-CM | POA: Diagnosis not present

## 2018-08-18 HISTORY — PX: CRANIOTOMY: SHX93

## 2018-08-18 LAB — CBC
HCT: 31.9 % — ABNORMAL LOW (ref 39.0–52.0)
Hemoglobin: 10.5 g/dL — ABNORMAL LOW (ref 13.0–17.0)
MCH: 29.6 pg (ref 26.0–34.0)
MCHC: 32.9 g/dL (ref 30.0–36.0)
MCV: 89.9 fL (ref 78.0–100.0)
Platelets: 117 10*3/uL — ABNORMAL LOW (ref 150–400)
RBC: 3.55 MIL/uL — ABNORMAL LOW (ref 4.22–5.81)
RDW: 14.2 % (ref 11.5–15.5)
WBC: 13.5 10*3/uL — ABNORMAL HIGH (ref 4.0–10.5)

## 2018-08-18 LAB — POCT I-STAT 7, (LYTES, BLD GAS, ICA,H+H)
Acid-base deficit: 3 mmol/L — ABNORMAL HIGH (ref 0.0–2.0)
Acid-base deficit: 3 mmol/L — ABNORMAL HIGH (ref 0.0–2.0)
Bicarbonate: 21.6 mmol/L (ref 20.0–28.0)
Bicarbonate: 22.2 mmol/L (ref 20.0–28.0)
Calcium, Ion: 1.06 mmol/L — ABNORMAL LOW (ref 1.15–1.40)
Calcium, Ion: 1 mmol/L — ABNORMAL LOW (ref 1.15–1.40)
HCT: 32 % — ABNORMAL LOW (ref 39.0–52.0)
HCT: 31 % — ABNORMAL LOW (ref 39.0–52.0)
Hemoglobin: 10.9 g/dL — ABNORMAL LOW (ref 13.0–17.0)
Hemoglobin: 10.5 g/dL — ABNORMAL LOW (ref 13.0–17.0)
O2 Saturation: 100 %
O2 Saturation: 100 %
Potassium: 3.7 mmol/L (ref 3.5–5.1)
Potassium: 4.4 mmol/L (ref 3.5–5.1)
Sodium: 142 mmol/L (ref 135–145)
Sodium: 141 mmol/L (ref 135–145)
TCO2: 23 mmol/L (ref 22–32)
TCO2: 23 mmol/L (ref 22–32)
pCO2 arterial: 37 mmHg (ref 32.0–48.0)
pCO2 arterial: 39.4 mmHg (ref 32.0–48.0)
pH, Arterial: 7.374 (ref 7.350–7.450)
pH, Arterial: 7.36 (ref 7.350–7.450)
pO2, Arterial: 197 mmHg — ABNORMAL HIGH (ref 83.0–108.0)
pO2, Arterial: 246 mmHg — ABNORMAL HIGH (ref 83.0–108.0)

## 2018-08-18 LAB — PREPARE RBC (CROSSMATCH)

## 2018-08-18 LAB — PROTIME-INR
INR: 1.32
Prothrombin Time: 16.3 seconds — ABNORMAL HIGH (ref 11.4–15.2)

## 2018-08-18 LAB — MRSA PCR SCREENING: MRSA by PCR: NEGATIVE

## 2018-08-18 LAB — CREATININE, SERUM
Creatinine, Ser: 0.8 mg/dL (ref 0.61–1.24)
GFR calc Af Amer: >60 mL/min (ref >60)
GFR calc non Af Amer: >60 mL/min (ref >60)

## 2018-08-18 LAB — APTT: aPTT: 27 seconds (ref 24–36)

## 2018-08-18 SURGERY — CRANIOTOMY TUMOR EXCISION
Anesthesia: General | Site: Head

## 2018-08-18 MED ORDER — DEXAMETHASONE 4 MG PO TABS
4.0000 mg | ORAL_TABLET | Freq: Three times a day (TID) | ORAL | Status: DC
  Administered 2018-08-20 – 2018-08-23 (×8): 4 mg via ORAL
  Filled 2018-08-18 (×8): qty 1

## 2018-08-18 MED ORDER — THROMBIN 20000 UNITS EX SOLR
CUTANEOUS | Status: DC | PRN
  Administered 2018-08-18: 13:00:00

## 2018-08-18 MED ORDER — HYDROCODONE-ACETAMINOPHEN 5-325 MG PO TABS: 1.0000 | ORAL_TABLET | ORAL | Status: DC | PRN

## 2018-08-18 MED ORDER — CHLORHEXIDINE GLUCONATE CLOTH 2 % EX PADS: 6.0000 | MEDICATED_PAD | Freq: Once | CUTANEOUS | Status: DC

## 2018-08-18 MED ORDER — OXYCODONE HCL 5 MG/5ML PO SOLN: 5.0000 mg | Freq: Once | ORAL | Status: DC | PRN

## 2018-08-18 MED ORDER — MORPHINE SULFATE (PF) 2 MG/ML IV SOLN: 1.0000 mg | INTRAVENOUS | Status: DC | PRN

## 2018-08-18 MED ORDER — SODIUM CHLORIDE 0.9 % IV SOLN
INTRAVENOUS | Status: DC | PRN
  Administered 2018-08-18 (×2): via INTRAVENOUS

## 2018-08-18 MED ORDER — POTASSIUM CHLORIDE IN NACL 20-0.9 MEQ/L-% IV SOLN
INTRAVENOUS | Status: DC
  Administered 2018-08-18 – 2018-08-21 (×5): via INTRAVENOUS
  Filled 2018-08-18 (×6): qty 1000

## 2018-08-18 MED ORDER — ROCURONIUM BROMIDE 50 MG/5ML IV SOSY
PREFILLED_SYRINGE | INTRAVENOUS | Status: AC
  Filled 2018-08-18: qty 10

## 2018-08-18 MED ORDER — PHENYLEPHRINE 40 MCG/ML (10ML) SYRINGE FOR IV PUSH (FOR BLOOD PRESSURE SUPPORT)
PREFILLED_SYRINGE | INTRAVENOUS | Status: AC
  Filled 2018-08-18: qty 10

## 2018-08-18 MED ORDER — PROMETHAZINE HCL 25 MG PO TABS: 12.5000 mg | ORAL_TABLET | ORAL | Status: DC | PRN

## 2018-08-18 MED ORDER — PROPOFOL 10 MG/ML IV BOLUS
INTRAVENOUS | Status: AC
  Filled 2018-08-18: qty 20

## 2018-08-18 MED ORDER — ONDANSETRON HCL 4 MG/2ML IJ SOLN
INTRAMUSCULAR | Status: AC
  Filled 2018-08-18: qty 2

## 2018-08-18 MED ORDER — PHENYLEPHRINE HCL 10 MG/ML IJ SOLN
INTRAMUSCULAR | Status: AC
  Filled 2018-08-18: qty 1

## 2018-08-18 MED ORDER — SODIUM CHLORIDE 0.9% IV SOLUTION: Freq: Once | INTRAVENOUS | Status: DC

## 2018-08-18 MED ORDER — ALBUMIN HUMAN 5 % IV SOLN
INTRAVENOUS | Status: DC | PRN
  Administered 2018-08-18 (×3): via INTRAVENOUS

## 2018-08-18 MED ORDER — CEFAZOLIN SODIUM-DEXTROSE 2-4 GM/100ML-% IV SOLN
INTRAVENOUS | Status: AC
  Filled 2018-08-18: qty 100

## 2018-08-18 MED ORDER — DEXAMETHASONE 4 MG PO TABS
4.0000 mg | ORAL_TABLET | Freq: Four times a day (QID) | ORAL | Status: AC
  Administered 2018-08-19 – 2018-08-20 (×4): 4 mg via ORAL
  Filled 2018-08-18 (×4): qty 1

## 2018-08-18 MED ORDER — ROCURONIUM BROMIDE 10 MG/ML (PF) SYRINGE
PREFILLED_SYRINGE | INTRAVENOUS | Status: DC | PRN
  Administered 2018-08-18 (×4): 50 mg via INTRAVENOUS

## 2018-08-18 MED ORDER — SODIUM CHLORIDE 0.9 % IV SOLN
INTRAVENOUS | Status: DC
  Administered 2018-08-18: 09:00:00 via INTRAVENOUS

## 2018-08-18 MED ORDER — BACITRACIN ZINC 500 UNIT/GM EX OINT: TOPICAL_OINTMENT | CUTANEOUS | Status: DC | PRN

## 2018-08-18 MED ORDER — FENTANYL CITRATE (PF) 250 MCG/5ML IJ SOLN
INTRAMUSCULAR | Status: AC
  Filled 2018-08-18: qty 5

## 2018-08-18 MED ORDER — GLYCOPYRROLATE PF 0.2 MG/ML IJ SOSY
PREFILLED_SYRINGE | INTRAMUSCULAR | Status: DC | PRN
  Administered 2018-08-18: .2 mg via INTRAVENOUS

## 2018-08-18 MED ORDER — DEXAMETHASONE 6 MG PO TABS
6.0000 mg | ORAL_TABLET | Freq: Four times a day (QID) | ORAL | Status: AC
  Administered 2018-08-18 – 2018-08-19 (×4): 6 mg via ORAL
  Filled 2018-08-18 (×5): qty 1

## 2018-08-18 MED ORDER — ONDANSETRON HCL 4 MG/2ML IJ SOLN
INTRAMUSCULAR | Status: DC | PRN
  Administered 2018-08-18: 4 mg via INTRAVENOUS

## 2018-08-18 MED ORDER — LABETALOL HCL 5 MG/ML IV SOLN: 10.0000 mg | INTRAVENOUS | Status: DC | PRN

## 2018-08-18 MED ORDER — LIDOCAINE-EPINEPHRINE 0.5 %-1:200000 IJ SOLN
INTRAMUSCULAR | Status: DC | PRN
  Administered 2018-08-18: 4 mL

## 2018-08-18 MED ORDER — THROMBIN (RECOMBINANT) 20000 UNITS EX SOLR
CUTANEOUS | Status: AC
  Filled 2018-08-18: qty 20000

## 2018-08-18 MED ORDER — DEXAMETHASONE SODIUM PHOSPHATE 10 MG/ML IJ SOLN
INTRAMUSCULAR | Status: AC
  Filled 2018-08-18: qty 1

## 2018-08-18 MED ORDER — FAMOTIDINE 20 MG PO TABS
20.0000 mg | ORAL_TABLET | Freq: Two times a day (BID) | ORAL | Status: DC
  Administered 2018-08-18 – 2018-08-24 (×13): 20 mg via ORAL
  Filled 2018-08-18 (×13): qty 1

## 2018-08-18 MED ORDER — LEVETIRACETAM IN NACL 1000 MG/100ML IV SOLN
1000.0000 mg | INTRAVENOUS | Status: AC
  Administered 2018-08-18: 1000 mg via INTRAVENOUS
  Filled 2018-08-18: qty 100

## 2018-08-18 MED ORDER — NALOXONE HCL 0.4 MG/ML IJ SOLN: 0.0800 mg | INTRAMUSCULAR | Status: DC | PRN

## 2018-08-18 MED ORDER — LIDOCAINE-EPINEPHRINE 0.5 %-1:200000 IJ SOLN
INTRAMUSCULAR | Status: AC
  Filled 2018-08-18: qty 1

## 2018-08-18 MED ORDER — BACITRACIN ZINC 500 UNIT/GM EX OINT
TOPICAL_OINTMENT | CUTANEOUS | Status: AC
  Filled 2018-08-18: qty 28.35

## 2018-08-18 MED ORDER — FENTANYL CITRATE (PF) 100 MCG/2ML IJ SOLN: 25.0000 ug | INTRAMUSCULAR | Status: DC | PRN

## 2018-08-18 MED ORDER — LEVETIRACETAM IN NACL 500 MG/100ML IV SOLN
500.0000 mg | Freq: Two times a day (BID) | INTRAVENOUS | Status: DC
  Administered 2018-08-18 – 2018-08-20 (×4): 500 mg via INTRAVENOUS
  Filled 2018-08-18 (×4): qty 100

## 2018-08-18 MED ORDER — 0.9 % SODIUM CHLORIDE (POUR BTL) OPTIME
TOPICAL | Status: DC | PRN
  Administered 2018-08-18 (×3): 1000 mL

## 2018-08-18 MED ORDER — SODIUM CHLORIDE 0.9 % IV SOLN
INTRAVENOUS | Status: DC | PRN
  Administered 2018-08-18 (×2): via INTRAVENOUS

## 2018-08-18 MED ORDER — ONDANSETRON HCL 4 MG/2ML IJ SOLN: 4.0000 mg | INTRAMUSCULAR | Status: DC | PRN

## 2018-08-18 MED ORDER — CEFAZOLIN SODIUM-DEXTROSE 2-4 GM/100ML-% IV SOLN
2.0000 g | INTRAVENOUS | Status: AC
  Administered 2018-08-18: 2 g via INTRAVENOUS

## 2018-08-18 MED ORDER — HEPARIN SODIUM (PORCINE) 5000 UNIT/ML IJ SOLN
5000.0000 [IU] | Freq: Three times a day (TID) | INTRAMUSCULAR | Status: DC
  Administered 2018-08-19 – 2018-08-24 (×18): 5000 [IU] via SUBCUTANEOUS
  Filled 2018-08-18 (×18): qty 1

## 2018-08-18 MED ORDER — PHENYLEPHRINE 40 MCG/ML (10ML) SYRINGE FOR IV PUSH (FOR BLOOD PRESSURE SUPPORT)
PREFILLED_SYRINGE | INTRAVENOUS | Status: DC | PRN
  Administered 2018-08-18: 80 ug via INTRAVENOUS

## 2018-08-18 MED ORDER — FENTANYL CITRATE (PF) 250 MCG/5ML IJ SOLN
INTRAMUSCULAR | Status: DC | PRN
  Administered 2018-08-18 (×2): 50 ug via INTRAVENOUS
  Administered 2018-08-18: 100 ug via INTRAVENOUS

## 2018-08-18 MED ORDER — LIDOCAINE 2% (20 MG/ML) 5 ML SYRINGE
INTRAMUSCULAR | Status: DC | PRN
  Administered 2018-08-18: 80 mg via INTRAVENOUS

## 2018-08-18 MED ORDER — ONDANSETRON HCL 4 MG PO TABS: 4.0000 mg | ORAL_TABLET | ORAL | Status: DC | PRN

## 2018-08-18 MED ORDER — LIDOCAINE 2% (20 MG/ML) 5 ML SYRINGE
INTRAMUSCULAR | Status: AC
  Filled 2018-08-18: qty 5

## 2018-08-18 MED ORDER — PROSIGHT PO TABS
1.0000 | ORAL_TABLET | Freq: Two times a day (BID) | ORAL | Status: DC
  Administered 2018-08-18 – 2018-08-24 (×12): 1 via ORAL
  Filled 2018-08-18 (×12): qty 1

## 2018-08-18 MED ORDER — PROPOFOL 10 MG/ML IV BOLUS
INTRAVENOUS | Status: DC | PRN
  Administered 2018-08-18: 150 mg via INTRAVENOUS
  Administered 2018-08-18: 50 mg via INTRAVENOUS

## 2018-08-18 MED ORDER — ROCURONIUM BROMIDE 50 MG/5ML IV SOSY
PREFILLED_SYRINGE | INTRAVENOUS | Status: AC
  Filled 2018-08-18: qty 15

## 2018-08-18 MED ORDER — SODIUM CHLORIDE 0.9 % IR SOLN
Status: DC | PRN
  Administered 2018-08-18: 1000 mL

## 2018-08-18 MED ORDER — EPHEDRINE SULFATE-NACL 50-0.9 MG/10ML-% IV SOSY
PREFILLED_SYRINGE | INTRAVENOUS | Status: DC | PRN
  Administered 2018-08-18: 15 mg via INTRAVENOUS

## 2018-08-18 MED ORDER — HEMOSTATIC AGENTS (NO CHARGE) OPTIME
TOPICAL | Status: DC | PRN
  Administered 2018-08-18: 1

## 2018-08-18 MED ORDER — SODIUM CHLORIDE 0.9 % IV SOLN
INTRAVENOUS | Status: DC | PRN
  Administered 2018-08-18: 20 ug/min via INTRAVENOUS

## 2018-08-18 MED ORDER — ONDANSETRON HCL 4 MG/2ML IJ SOLN: 4.0000 mg | Freq: Once | INTRAMUSCULAR | Status: DC | PRN

## 2018-08-18 MED ORDER — DEXAMETHASONE SODIUM PHOSPHATE 10 MG/ML IJ SOLN
INTRAMUSCULAR | Status: DC | PRN
  Administered 2018-08-18: 10 mg via INTRAVENOUS

## 2018-08-18 MED ORDER — OXYCODONE HCL 5 MG PO TABS: 5.0000 mg | ORAL_TABLET | Freq: Once | ORAL | Status: DC | PRN

## 2018-08-18 SURGICAL SUPPLY — 93 items
BENZOIN TINCTURE PRP APPL 2/3 (GAUZE/BANDAGES/DRESSINGS) IMPLANT
BLADE CLIPPER SURG (BLADE) ×2 IMPLANT
BLADE SAW GIGLI 16 STRL (MISCELLANEOUS) IMPLANT
BLADE SURG 15 STRL LF DISP TIS (BLADE) ×1 IMPLANT
BLADE SURG 15 STRL SS (BLADE) ×1
BLADE ULTRA TIP 2M (BLADE) IMPLANT
BNDG GAUZE ELAST 4 BULKY (GAUZE/BANDAGES/DRESSINGS) IMPLANT
BUR ACORN 6.0 PRECISION (BURR) ×2 IMPLANT
BUR MATCHSTICK NEURO 3.0 LAGG (BURR) IMPLANT
BUR ROUND FLUTED 5 RND (BURR) ×2 IMPLANT
BUR SPIRAL ROUTER 2.3 (BUR) ×2 IMPLANT
CANISTER SUCT 3000ML PPV (MISCELLANEOUS) ×6 IMPLANT
CARTRIDGE OIL MAESTRO DRILL (MISCELLANEOUS) ×1 IMPLANT
CATH VENTRIC 35X38 W/TROCAR LG (CATHETERS) IMPLANT
CLIP VESOCCLUDE MED 6/CT (CLIP) ×4 IMPLANT
CONT SPEC 4OZ CLIKSEAL STRL BL (MISCELLANEOUS) ×2 IMPLANT
DECANTER SPIKE VIAL GLASS SM (MISCELLANEOUS) ×2 IMPLANT
DIFFUSER DRILL AIR PNEUMATIC (MISCELLANEOUS) ×2 IMPLANT
DRAIN SUBARACHNOID (WOUND CARE) IMPLANT
DRAPE CAMERA VIDEO/LASER (DRAPES) IMPLANT
DRAPE MICROSCOPE LEICA (MISCELLANEOUS) IMPLANT
DRAPE NEUROLOGICAL W/INCISE (DRAPES) ×2 IMPLANT
DRAPE ORTHO SPLIT 77X108 STRL (DRAPES)
DRAPE SURG 17X23 STRL (DRAPES) IMPLANT
DRAPE SURG ORHT 6 SPLT 77X108 (DRAPES) IMPLANT
DRAPE WARM FLUID 44X44 (DRAPE) ×2 IMPLANT
DRSG OPSITE POSTOP 4X8 (GAUZE/BANDAGES/DRESSINGS) ×2 IMPLANT
DURAPREP 6ML APPLICATOR 50/CS (WOUND CARE) ×2 IMPLANT
ELECT REM PT RETURN 9FT ADLT (ELECTROSURGICAL) ×2
ELECTRODE REM PT RTRN 9FT ADLT (ELECTROSURGICAL) ×1 IMPLANT
EVACUATOR 1/8 PVC DRAIN (DRAIN) IMPLANT
EVACUATOR SILICONE 100CC (DRAIN) IMPLANT
FORCEPS BIPOLAR SPETZLER 8 1.0 (NEUROSURGERY SUPPLIES) ×2 IMPLANT
GAUZE 4X4 16PLY RFD (DISPOSABLE) IMPLANT
GAUZE SPONGE 4X4 12PLY STRL (GAUZE/BANDAGES/DRESSINGS) ×2 IMPLANT
GLOVE BIO SURGEON STRL SZ 6.5 (GLOVE) ×4 IMPLANT
GLOVE BIO SURGEON STRL SZ8 (GLOVE) ×2 IMPLANT
GLOVE BIO SURGEON STRL SZ8.5 (GLOVE) ×2 IMPLANT
GLOVE BIOGEL PI IND STRL 6.5 (GLOVE) ×2 IMPLANT
GLOVE BIOGEL PI IND STRL 8 (GLOVE) ×2 IMPLANT
GLOVE BIOGEL PI INDICATOR 6.5 (GLOVE) ×2
GLOVE BIOGEL PI INDICATOR 8 (GLOVE) ×2
GLOVE ECLIPSE 6.5 STRL STRAW (GLOVE) ×4 IMPLANT
GLOVE ECLIPSE 8.0 STRL XLNG CF (GLOVE) ×6 IMPLANT
GLOVE SURG SS PI 6.5 STRL IVOR (GLOVE) ×2 IMPLANT
GLOVE SURG SS PI 7.5 STRL IVOR (GLOVE) ×4 IMPLANT
GOWN STRL REUS W/ TWL LRG LVL3 (GOWN DISPOSABLE) ×5 IMPLANT
GOWN STRL REUS W/ TWL XL LVL3 (GOWN DISPOSABLE) IMPLANT
GOWN STRL REUS W/TWL 2XL LVL3 (GOWN DISPOSABLE) ×4 IMPLANT
GOWN STRL REUS W/TWL LRG LVL3 (GOWN DISPOSABLE) ×5
GOWN STRL REUS W/TWL XL LVL3 (GOWN DISPOSABLE)
GRAFT DURAGEN MATRIX 2WX2L ×2 IMPLANT
HEMOSTAT SURGICEL 2X14 (HEMOSTASIS) ×2 IMPLANT
HOOK DURA 1/2IN (MISCELLANEOUS) ×2 IMPLANT
IV SOD CHL 0.9% 1000ML (IV SOLUTION) ×2 IMPLANT
KIT BASIN OR (CUSTOM PROCEDURE TRAY) ×2 IMPLANT
KIT DRAIN CSF ACCUDRAIN (MISCELLANEOUS) IMPLANT
KIT TURNOVER KIT B (KITS) ×2 IMPLANT
NEEDLE HYPO 25X1 1.5 SAFETY (NEEDLE) ×2 IMPLANT
NEEDLE SPNL 18GX3.5 QUINCKE PK (NEEDLE) IMPLANT
NS IRRIG 1000ML POUR BTL (IV SOLUTION) ×6 IMPLANT
OIL CARTRIDGE MAESTRO DRILL (MISCELLANEOUS) ×2
PACK CRANIOTOMY CUSTOM (CUSTOM PROCEDURE TRAY) ×2 IMPLANT
PATTIES SURGICAL .25X.25 (GAUZE/BANDAGES/DRESSINGS) IMPLANT
PATTIES SURGICAL .5 X.5 (GAUZE/BANDAGES/DRESSINGS) IMPLANT
PATTIES SURGICAL .5 X3 (DISPOSABLE) ×6 IMPLANT
PATTIES SURGICAL 1/4 X 3 (GAUZE/BANDAGES/DRESSINGS) IMPLANT
PATTIES SURGICAL 1X1 (DISPOSABLE) ×2 IMPLANT
PIN MAYFIELD SKULL DISP (PIN) ×2 IMPLANT
RUBBERBAND STERILE (MISCELLANEOUS) IMPLANT
SET CARTRIDGE AND TUBING (SET/KITS/TRAYS/PACK) ×2 IMPLANT
SPECIMEN JAR SMALL (MISCELLANEOUS) IMPLANT
SPONGE NEURO XRAY DETECT 1X3 (DISPOSABLE) ×2 IMPLANT
SPONGE SURGIFOAM ABS GEL 100 (HEMOSTASIS) ×2 IMPLANT
STAPLER VISISTAT 35W (STAPLE) ×2 IMPLANT
SUT ETHILON 3 0 FSL (SUTURE) IMPLANT
SUT ETHILON 3 0 PS 1 (SUTURE) IMPLANT
SUT NURALON 4 0 TR CR/8 (SUTURE) ×6 IMPLANT
SUT SILK 0 TIES 10X30 (SUTURE) IMPLANT
SUT STEEL 0 (SUTURE)
SUT STEEL 0 18XMFL TIE 17 (SUTURE) IMPLANT
SUT VIC AB 2-0 CT2 18 VCP726D (SUTURE) ×4 IMPLANT
TIP NONSTICK .5MMX23CM (INSTRUMENTS)
TIP NONSTICK .5X23 (INSTRUMENTS) IMPLANT
TIP STANDARD 36KHZ (INSTRUMENTS) ×2
TIP STD 36KHZ (INSTRUMENTS) ×1 IMPLANT
TOWEL GREEN STERILE (TOWEL DISPOSABLE) ×2 IMPLANT
TOWEL GREEN STERILE FF (TOWEL DISPOSABLE) ×2 IMPLANT
TRAY FOLEY MTR SLVR 16FR STAT (SET/KITS/TRAYS/PACK) ×2 IMPLANT
TUBE CONNECTING 12X1/4 (SUCTIONS) ×2 IMPLANT
UNDERPAD 30X30 (UNDERPADS AND DIAPERS) ×2 IMPLANT
WATER STERILE IRR 1000ML POUR (IV SOLUTION) ×2 IMPLANT
WRENCH TORQUE 36KHZ (INSTRUMENTS) ×4 IMPLANT

## 2018-08-18 NOTE — Anesthesia Preprocedure Evaluation (Addendum)
Anesthesia Evaluation  Patient identified by MRN, date of birth, ID band Patient awake    Reviewed: Allergy & Precautions, NPO status , Patient's Chart, lab work & pertinent test results  History of Anesthesia Complications Negative for: history of anesthetic complications  Airway Mallampati: III  TM Distance: >3 FB Neck ROM: Full    Dental  (+) Dental Advisory Given, Teeth Intact   Pulmonary neg pulmonary ROS,    breath sounds clear to auscultation       Cardiovascular Exercise Tolerance: Good negative cardio ROS   Rhythm:Regular Rate:Normal     Neuro/Psych  Meningioma C/o numbness in bilateral lower extremities with standing  '19 MRI Brain - Large, lobulated and invasive left vertex meningioma appears 1-2 mm larger since February, now 51 x 50 x 42 mm. Invasion through the skull and into the scalp soft tissues as before. Mildly increased mass effect on the left superior frontal gyrus, and increased associated left frontal lobe vasogenic edema. No right superior frontal lobe involvement. 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 But the anterior and posterior segments of the sinus remain patent via collateral flow through a right vertex cortical vein.   negative psych ROS   GI/Hepatic GERD  Medicated and Controlled,(+) Hepatitis -  Endo/Other  negative endocrine ROS  Renal/GU negative Renal ROS  negative genitourinary   Musculoskeletal  (+) Arthritis ,   Abdominal   Peds  Hematology negative hematology ROS (+)   Anesthesia Other Findings Glaucoma   Reproductive/Obstetrics                            Anesthesia Physical Anesthesia Plan  ASA: II  Anesthesia Plan: General   Post-op Pain Management:    Induction: Intravenous  PONV Risk Score and Plan: 3 and Treatment may vary due to age or medical condition, Ondansetron and Dexamethasone  Airway Management  Planned: Oral ETT  Additional Equipment: Arterial line  Intra-op Plan:   Post-operative Plan: Possible Post-op intubation/ventilation  Informed Consent: I have reviewed the patients History and Physical, chart, labs and discussed the procedure including the risks, benefits and alternatives for the proposed anesthesia with the patient or authorized representative who has indicated his/her understanding and acceptance.   Dental advisory given  Plan Discussed with: CRNA and Anesthesiologist  Anesthesia Plan Comments: (2 large bore IV's)       Anesthesia Quick Evaluation

## 2018-08-18 NOTE — Anesthesia Procedure Notes (Signed)
Procedure Name: Intubation Date/Time: 08/18/2018 12:54 PM Performed by: Teressa Lower., CRNA Pre-anesthesia Checklist: Patient identified, Emergency Drugs available, Suction available and Patient being monitored Patient Re-evaluated:Patient Re-evaluated prior to induction Oxygen Delivery Method: Circle system utilized Preoxygenation: Pre-oxygenation with 100% oxygen Induction Type: IV induction Ventilation: Mask ventilation without difficulty and Oral airway inserted - appropriate to patient size Laryngoscope Size: Glidescope and 3 Grade View: Grade I Tube type: Oral Tube size: 7.5 mm Number of attempts: 3 Airway Equipment and Method: Oral airway,  Video-laryngoscopy and Rigid stylet Placement Confirmation: ETT inserted through vocal cords under direct vision,  positive ETCO2 and breath sounds checked- equal and bilateral Secured at: 25 cm Tube secured with: Tape Dental Injury: Teeth and Oropharynx as per pre-operative assessment  Difficulty Due To: Difficulty was anticipated, Difficult Airway- due to limited oral opening, Difficult Airway- due to anterior larynx and Difficult Airway- due to reduced neck mobility Future Recommendations: Recommend- induction with short-acting agent, and alternative techniques readily available Comments: DLx1 by CRNA with miller 2, grade 3 view, DLx1 by Dr. Fransisco Beau with miller 2 unable to view VC, then glidescope 3 utilized, ett secured, BBS, +etco2, mouth as preop.

## 2018-08-18 NOTE — Transfer of Care (Signed)
Immediate Anesthesia Transfer of Care Note  Patient: James Olsen  Procedure(s) Performed: CRANIOTOMY TUMOR EXCISION (N/A Head)  Patient Location: PACU  Anesthesia Type:General  Level of Consciousness: awake and alert   Airway & Oxygen Therapy: Patient Spontanous Breathing and Patient connected to nasal cannula oxygen  Post-op Assessment: Report given to RN, Post -op Vital signs reviewed and stable and surgeon notified of right sided weakness prior to leaving OR  Post vital signs: Reviewed and stable  Last Vitals:  Vitals Value Taken Time  BP    Temp    Pulse    Resp    SpO2      Last Pain:  Vitals:   08/18/18 0900  TempSrc:   PainSc: 0-No pain         Complications: No apparent anesthesia complications

## 2018-08-18 NOTE — H&P (Signed)
BP (!) 142/76   Pulse 72   Temp 98.1 F (36.7 C) (Oral)   Resp 16   Ht 5\' 9"  (1.753 m)   Wt 90.4 kg   SpO2 99%   BMI 29.42 kg/m    James Olsen came in today after his hairdresser commented on a lump on his head. As a result of the lump on his head, which has been pain free throughout its duration, he underwent an MRI of the brain. What that revealed was a uniformly, aggressively-enhancing mass, which is both intra and extracranial and appears to be a meningioma.    He is 72 years of age, 5 feet 9 inches, and weighs 195 pounds. He is left-handed, currently retired. He does not smoke. Does drink socially. No history of substance abuse. Temperature is 97, blood pressure is 134/77, pulse is 65. He has no pain.    He has no known drug allergies. He takes Vitamins for his eyes. He has undergone a tonsillectomy, pilonidal cystectomy, bursectomy, laparoscopic cholecystectomy, endoscopic gallstone removal, removal of distal tip of the index finger, laser reduction of pressure of both eyes. Mother and father both deceased with diabetes, low blood pressure present.   REVIEW OF SYSTEMS: Positive for cataracts.   PHYSICAL EXAM: He is alert, oriented x4. Answers all questions appropriately. Memory, language, attention span, and fund of knowledge are normal. Speech is clear and is fluent. Hearing intact to voice. Uvula elevates midline. Shoulder shrug is normal. Tongue protrudes in the midline. He has a mass which is involving the sinus, slightly eccentric to the left, at the crown of his head, both intra and extracranial, certainly involving the bone. Little or no real mass effect, except for the local push, but there is no shift of any sort. The mass appears to be fairly vascular. We do not have a CT yet. We also do not have a venogram, so I am not sure if the sinus is occluded, but my suspicion is that it is. At this point in time, I want to do an MR venogram to assess for any collateralization around the  sinus. If the sinus is involved, we can get a near-complete resection. If the sinus is not involved, we are going to have to leave some behind. Also, I want to do a CT, so we can get a much better assessment of the bony anatomy in and around that area. Neurologically, he is 100% normal. No abnormalities whatsoever.      James Olsen returns with an MRI scan.  He has occluded some of the sinus in and around the tumor.  He has a vein on the right side, so the side opposite where most of the mass is.  At this point, he has become a little bit more symptomatic, has some tingling in his leg, feels that it has grown significantly, so we will proceed with resection.  Given that, we went over again the operation, so we will take the tumor, take as much as we can safely.  Then, we will get a head CT so that we can make the custom implant for the cranioplasty.  They asked how long it would take.  I told them variable, it depends entirely on the texture of the meningioma, how well capsulated it is, how infiltrative it is or is not.  Risks of the procedure, stroke, coma and death, paralysis, weakness, and other risks were explained.  He understands and wishes to proceed.

## 2018-08-18 NOTE — Op Note (Signed)
BP (!) 142/76   Pulse 72   Temp (!) 97 F (36.1 C)   Resp 16   Ht 5\' 9"  (1.753 m)   Wt 90.4 kg   SpO2 99%   BMI 29.42 kg/m  08/18/2018  5:10 PM  PATIENT:  James Olsen  72 y.o. male with a large intra and extracranial mass admitted for resection.   PRE-OPERATIVE DIAGNOSIS:  Meningioma  POST-OPERATIVE DIAGNOSIS:  Meningioma  PROCEDURE:  Procedure(s): CRANIOTOMY TUMOR EXCISION  SURGEON: Surgeon(s): [name], MD Newman Pies, MD  ASSISTANTS:jenkins, jeffrey  ANESTHESIA:   general  EBL:  Total I/O In: 9758 [I.V.:3000; Blood:945; IV Piggyback:750] Out: 2900 [Urine:300; Blood:2600]  BLOOD ADMINISTERED:945 CC PRBC   COUNT:per nursing  DRAINS: none   SPECIMEN:  Source of Specimen:  cranial mass  DICTATION: James Olsen was taken to the operating room, intubated, and placed under a general anesthetic without difficulty. He was positioned supine with his head on a horseshoe head rest. His head was shaved, prepped and draped in a sterile manner. I infiltrated lidocaine into this planned incision. I made a coronal incision over the highest point of the mass. There was profuse bleeding from the scalp incision, controlled with Raney clips. I then dissected the mass capsule from the scalp, and along the edges of the skull defect caused by the tumor. I used the curette to isolate the tumor from the bony edges. The capsule clearly had some component of the skull still attached, though it was quite thin. I used bayer rongeurs to remove the superficial layer from the tumor. Soft, characteristic meningioma tissue was present underneath the capsule. I removed tumor and sent specimen to Pathology.  I continued with Dr. Arnoldo Morale assistance with tumor removal. I used the craniotome to remove involved skull, along with a Leksell rongeur. We exposed dura which was not infiltrated with tumor. We continued to work around the margin of the tumor and continued with the resection using  suction, and curettes. There was profuse bleeding from the dura and the skull during the resection. Once we had essentially removed all of the extradural mass, we then opened the dura mater on the left side away from the sinus. We encountered the intradural portion of the tumor and resected some of the tumor. Since this was abutting the sinus I was not too aggressive in this area. We did resect the involved dura on the left side. The right sided dura still was covered by a thin rim of bone, and since it was where the sinus had been displaced too, I chose not to resect any dura there. I achieved hemostasis, then closed. I placed a dural substitute over the defect. I then approximated the scalp in layers closing with vicryl suture and staples.I applied a sterile dressing.   PLAN OF CARE: Admit to inpatient   PATIENT DISPOSITION:  PACU - hemodynamically stable.   Delay start of Pharmacological VTE agent (>24hrs) due to surgical blood loss or risk of bleeding:  yes

## 2018-08-19 ENCOUNTER — Encounter (HOSPITAL_COMMUNITY): Payer: Self-pay | Admitting: Neurosurgery

## 2018-08-19 ENCOUNTER — Inpatient Hospital Stay (HOSPITAL_COMMUNITY): Payer: Medicare Other

## 2018-08-19 LAB — TYPE AND SCREEN
ABO/RH(D): A POS
ABO/RH(D): A POS
Antibody Screen: NEGATIVE
Antibody Screen: NEGATIVE
Unit division: 0
Unit division: 0
Unit division: 0
Unit division: 0
Unit division: 0
Unit division: 0

## 2018-08-19 LAB — PROTIME-INR
INR: 1.22
Prothrombin Time: 15.3 seconds — ABNORMAL HIGH (ref 11.4–15.2)

## 2018-08-19 LAB — CBC
HCT: 34.5 % — ABNORMAL LOW (ref 39.0–52.0)
Hemoglobin: 11.7 g/dL — ABNORMAL LOW (ref 13.0–17.0)
MCH: 29.8 pg (ref 26.0–34.0)
MCHC: 33.9 g/dL (ref 30.0–36.0)
MCV: 88 fL (ref 78.0–100.0)
Platelets: 104 10*3/uL — ABNORMAL LOW (ref 150–400)
RBC: 3.92 MIL/uL — ABNORMAL LOW (ref 4.22–5.81)
RDW: 14.3 % (ref 11.5–15.5)
WBC: 11.7 10*3/uL — ABNORMAL HIGH (ref 4.0–10.5)

## 2018-08-19 LAB — BPAM RBC
Blood Product Expiration Date: 201909072359
Blood Product Expiration Date: 201909072359
Blood Product Expiration Date: 201909072359
Blood Product Expiration Date: 201909072359
Blood Product Expiration Date: 201909072359
Blood Product Expiration Date: 201909082359
ISSUE DATE / TIME: 201908281209
ISSUE DATE / TIME: 201908281209
ISSUE DATE / TIME: 201908281209
ISSUE DATE / TIME: 201908282254
ISSUE DATE / TIME: 201908282036
ISSUE DATE / TIME: 201908282254
Unit Type and Rh: 6200
Unit Type and Rh: 6200
Unit Type and Rh: 6200
Unit Type and Rh: 6200
Unit Type and Rh: 6200
Unit Type and Rh: 6200

## 2018-08-19 MED ORDER — GADOBENATE DIMEGLUMINE 529 MG/ML IV SOLN
20.0000 mL | Freq: Once | INTRAVENOUS | Status: AC
  Administered 2018-08-19: 20 mL via INTRAVENOUS

## 2018-08-19 MED FILL — Thrombin (Recombinant) For Soln 20000 Unit: CUTANEOUS | Qty: 1 | Status: AC

## 2018-08-19 NOTE — Progress Notes (Signed)
Patient ID: James Olsen, male   DOB: 1946-02-10, 72 y.o.   MRN: [id number] BP 118/64   Pulse 90   Temp 98.4 F (36.9 C) (Oral)   Resp 11   Ht 5\' 9"  (1.753 m)   Wt 90.4 kg   SpO2 98%   BMI 29.42 kg/m  Expressive aphasia Alert, oriented to person, place, situation, place. Plegic on right side. Possible venous infarct, nothing seen however on postop scan. Wound is clean, dry, no signs of infection Moving left side well Perrl, full eom

## 2018-08-19 NOTE — Plan of Care (Signed)
Pt consumes 100% of meals. Needs assistance w/ set up.

## 2018-08-20 ENCOUNTER — Encounter (HOSPITAL_COMMUNITY): Payer: Self-pay | Admitting: Physical Medicine and Rehabilitation

## 2018-08-20 MED ORDER — SENNA 8.6 MG PO TABS
1.0000 | ORAL_TABLET | Freq: Every day | ORAL | Status: DC | PRN
  Administered 2018-08-20: 8.6 mg via ORAL
  Filled 2018-08-20: qty 1

## 2018-08-20 MED ORDER — LEVETIRACETAM 500 MG PO TABS
500.0000 mg | ORAL_TABLET | Freq: Two times a day (BID) | ORAL | Status: DC
  Administered 2018-08-20 – 2018-08-24 (×9): 500 mg via ORAL
  Filled 2018-08-20 (×9): qty 1

## 2018-08-20 NOTE — Progress Notes (Signed)
Patient ID: James Olsen, male   DOB: February 17, 1946, 72 y.o.   MRN: [id number] BP 123/62   Pulse 70   Temp 98 F (36.7 C) (Oral)   Resp 12   Ht 5\' 9"  (1.753 m)   Wt 90.4 kg   SpO2 96%   BMI 29.42 kg/m  Alert, expressive aphasia. Aphasia is clearly improving Moving left side well, right side is slowly getting better MRI shows partial resection of the tumor, sinus intact, significant reduction in the tumor.  Expect continued symptomatic improvement. Wound is clean, dry, no signs of infection

## 2018-08-20 NOTE — Plan of Care (Signed)
Pt ambulated in hall as well as room today. Pt does not complain of any pain and is resting comfortably in the chair at this time

## 2018-08-20 NOTE — Evaluation (Signed)
Speech Language Pathology Evaluation Patient Details Name: James Olsen MRN: [id number] DOB: December 26, 1945 Today's Date: 08/20/2018 Time: 1416-1440 SLP Time Calculation (min) (ACUTE ONLY): 24 min  Problem List:  Patient Active Problem List   Diagnosis Date Noted  . Tumor 08/18/2018  . Meningioma determined by biopsy of brain ([place]) 08/18/2018  . MACULAR DEGENERATION 02/29/2008  . ALLERGY 02/29/2008  . Nonspecific (abnormal) findings on radiological and other examination of body structure 12/26/2007  . NONSPECIFIC ABNORM FIND RAD&OTH EXAM LUNG FIELD 12/26/2007  . GALLSTONES 10/01/2007  . ELEVATION, TRANSAMINASE/LDH LEVELS 09/06/2007  . HEPATITIS NOS 09/01/2007  . ABDOMINAL PAIN, EPIGASTRIC 09/01/2007   Past Medical History:  Past Medical History:  Diagnosis Date  . Arthritis   . GERD (gastroesophageal reflux disease)   . Glaucoma   . Macular degeneration   . Wears glasses    Past Surgical History:  Past Surgical History:  Procedure Laterality Date  . CHOLECYSTECTOMY  2008   lap choli  . COLONOSCOPY    . CRANIOTOMY N/A 08/18/2018   Procedure: CRANIOTOMY TUMOR EXCISION;  Surgeon: [name], MD;  Location: [place];  Service: Neurosurgery;  Laterality: N/A;  CRANIOTOMY TUMOR EXCISION  . NAILBED REPAIR Left 05/04/2014   Procedure: LEFT INDEX NAIL ABLATION V-Y FLAP COVERAGE;  Surgeon: [name]., MD;  Location: [place];  Service: Orthopedics;  Laterality: Left;  index  . PILONIDAL CYST EXCISION     x2  . TONSILLECTOMY    . VASOTOMY     HPI:  Pt is a 72 year old male noticed to have a lump under his scalp by his hairdresser. 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   Assessment / Plan / Recommendation Clinical Impression  Pt presents with a rapidly improving, mild expressive aphasia, primarily impacting word finding and full grammatical fluency in conversation. Pt  is able to communicate complex ideas with telegraphic language. With verbal cues and extra time he is able to extend language to accuracy. Pt able to describe a vacation and ask functional questions about rehabilitation with extra time and min verbal cues. Introduced compensatory strategies to prepare listeners and discussed this with his wife. Recommend CIR level therapy for functional communication    SLP Assessment  SLP Recommendation/Assessment: Patient needs continued Speech Lanaguage Pathology Services    Follow Up Recommendations       Frequency and Duration min 2x/week  2 weeks      SLP Evaluation Cognition  Overall Cognitive Status: Within Functional Limits for tasks assessed Orientation Level: Oriented X4       Comprehension  Auditory Comprehension Overall Auditory Comprehension: Impaired Yes/No Questions: Within Functional Limits Commands: Within Functional Limits Conversation: Other (comment)(needs repetition with complex language) EffectiveTechniques: Extra processing time;Repetition    Expression Verbal Expression Overall Verbal Expression: Impaired Initiation: No impairment Automatic Speech: Name;Social Response Level of Generative/Spontaneous Verbalization: Conversation Repetition: No impairment Naming: (in conversation) Pragmatics: No impairment   Oral / Motor  Oral Motor/Sensory Function Overall Oral Motor/Sensory Function: Within functional limits Motor Speech Overall Motor Speech: Appears within functional limits for tasks assessed   GO                   Herbie Baltimore, MA CCC-SLP [phone]  Lynann Beaver 08/20/2018, 3:17 PM

## 2018-08-20 NOTE — Consult Note (Addendum)
Physical Medicine and Rehabilitation Consult   Reason for Consult: Functional deficits Referring Physician: Dr. Christella Noa    HPI: James Olsen is a 72 y.o. left handed male with history of OA, GERD, macular degeneration, progressive numbness BLE since early this year who was found to have a mass on his head consistent with extracranial and intracranial left frontal meningioma. History taken from chart review and patient. He was admitted on 08/18/18 for crani with tumor excision by Dr. Christella Noa. Post op with weakness on right and expressive aphasia.  MRI brain reviewed, showing left infarct. Per report, done revealing small focus of acute ischemia at inferior cavity margin extending to the ventricle, slight herniation left middle frontal gyrus through cranial defect, and stable edema mass-effect left superior frontal lobe.  Therapy evaluations done today revealing right-sided weakness with balance deficits affecting mobility, difficulty completing ADLs and mild expressive aphasia.  CIR recommended due to functional decline.  Review of Systems  Constitutional: Negative for chills and fever.  HENT: Negative for hearing loss and tinnitus.   Eyes: Negative for blurred vision and double vision.  Respiratory: Negative for cough and shortness of breath.   Cardiovascular: Negative for chest pain and palpitations.  Gastrointestinal: Negative for heartburn and nausea.  Genitourinary: Negative for dysuria and urgency.  Musculoskeletal: Negative for joint pain, myalgias and neck pain.  Skin: Negative for rash.  Neurological: Positive for sensory change (BLE numbness with activity since January  ), speech change and focal weakness. Negative for dizziness, seizures, loss of consciousness and headaches.  Psychiatric/Behavioral: The patient does not have insomnia.   All other systems reviewed and are negative.    Past Medical History:  Diagnosis Date  . Arthritis   . GERD (gastroesophageal  reflux disease)   . Glaucoma   . Macular degeneration   . Wears glasses     Past Surgical History:  Procedure Laterality Date  . CHOLECYSTECTOMY  2008   lap choli  . COLONOSCOPY    . CRANIOTOMY N/A 08/18/2018   Procedure: CRANIOTOMY TUMOR EXCISION;  Surgeon: [name], MD;  Location: [place];  Service: Neurosurgery;  Laterality: N/A;  CRANIOTOMY TUMOR EXCISION  . NAILBED REPAIR Left 05/04/2014   Procedure: LEFT INDEX NAIL ABLATION V-Y FLAP COVERAGE;  Surgeon: [name]., MD;  Location: [place];  Service: Orthopedics;  Laterality: Left;  index  . PILONIDAL CYST EXCISION     x2  . TONSILLECTOMY    . VASECTOMY      Family History  Problem Relation Age of Onset  . Diabetes Mother   . Skin cancer Brother   . Congenital heart disease Brother      Social History: Married. Retired --used to work for [employer].  reports that he has never smoked. He has never used smokeless tobacco. He reports that he drinks alcoho--beer couple of times a week and liquor once a month.  He reports that he does not use drugs.    Allergies: No Known Allergies    Medications Prior to Admission  Medication Sig Dispense Refill  . acetaminophen (TYLENOL) 500 MG tablet Take 500 mg by mouth daily as needed for moderate pain or headache.    . Multiple Vitamins-Minerals (PRESERVISION AREDS 2) CAPS Take 1 tablet by mouth 2 (two) times daily.    . Omega-3 Fatty Acids (FISH OIL) 1200 MG CAPS Take 1,200 mg by mouth every evening.    . ranitidine (ZANTAC) 150 MG tablet Take 150 mg by  mouth daily as needed for heartburn.    . Red Yeast Rice 600 MG CAPS Take 1,200 mg by mouth daily.      Home: Home Living Family/patient expects to be discharged to:: Private residence Living Arrangements: Spouse/significant other Available Help at Discharge: Family, Available 24 hours/day Type of Home: House Home Access: Stairs to enter Technical brewer of Steps: 3 Entrance Stairs-Rails: None Home  Layout: Two level, Able to live on main level with bedroom/bathroom Bathroom Shower/Tub: Multimedia programmer: Standard Home Equipment: Civil engineer, contracting - built in  Lives With: Spouse  Functional History: Prior Function Level of Independence: Independent Functional Status:  Mobility: Bed Mobility Overal bed mobility: Needs Assistance Bed Mobility: Rolling, Supine to Sit Rolling: Min guard Supine to sit: Mod assist General bed mobility comments: pt able to advance to EOB but requires (A) to bring R LE to eob. pt needs (A) to advance R hip to EOBq Transfers Overall transfer level: Needs assistance Equipment used: 2 person hand held assist Transfers: Sit to/from Stand Sit to Stand: +2 physical assistance, Min assist, +2 safety/equipment General transfer comment: pt requires cues for positioning and righting to center.  Ambulation/Gait Ambulation/Gait assistance: Mod assist, +2 safety/equipment, +2 physical assistance Gait Distance (Feet): 70 Feet Assistive device: 2 person hand held assist Gait Pattern/deviations: Step-to pattern, Step-through pattern, Decreased stride length, Decreased dorsiflexion - right, Shuffle, Decreased weight shift to left General Gait Details: facilitation for weight shift, cues and assist for heel strike on R and for step length, cues and time for turns and maneuvering around obstacles in the room; assist for balance and L weight shift to allow for R swing     ADL: ADL Overall ADL's : Needs assistance/impaired Eating/Feeding: Set up, Sitting Grooming: Wash/dry face, Set up, Sitting Upper Body Bathing: Minimal assistance, Sitting Lower Body Bathing: Maximal assistance Upper Body Dressing : Minimal assistance Lower Body Dressing: Maximal assistance Toilet Transfer: +2 for physical assistance, +2 for safety/equipment, Minimal assistance, BSC, Stand-pivot Toileting- Clothing Manipulation and Hygiene: Total assistance Functional mobility during ADLs:  +2 for physical assistance, Moderate assistance, +2 for safety/equipment General ADL Comments: pt requires hand held (A) and mod cues for weight shift to the left to advance the R LE  Cognition: Cognition Overall Cognitive Status: Within Functional Limits for tasks assessed Orientation Level: Oriented X4 Cognition Arousal/Alertness: Awake/alert Behavior During Therapy: WFL for tasks assessed/performed Overall Cognitive Status: Within Functional Limits for tasks assessed General Comments: Pt with delayed response due to expressive deficits   Blood pressure (!) 111/93, pulse 67, temperature 98.1 F (36.7 C), temperature source Oral, resp. rate 12, height 5\' 9"  (1.753 m), weight 90.4 kg, SpO2 98 %. Physical Exam  Nursing note and vitals reviewed. Constitutional: He is oriented to person, place, and time. He appears well-developed and well-nourished.  HENT:  Scalp incision  Eyes: Pupils are equal, round, and reactive to light. EOM are normal. Right eye exhibits no discharge. Left eye exhibits no discharge.  Neck: Normal range of motion. Neck supple.  Cardiovascular: Normal rate and regular rhythm.  Respiratory: Effort normal and breath sounds normal.  GI: Soft. Bowel sounds are normal.  Musculoskeletal: He exhibits no tenderness.  1+ edema right hand  Neurological: He is alert and oriented to person, place, and time.  Speech clear but occasional word finding deficits.  Verbal apraxia Motor: RUE: 0/5 proximal to distal RLE: HF KE 3+/5, ADF 0/5 LUE/LLE: 5/5 proximal to distal Sensation intact to light touch, except b/l feet Extensor tone RLE.  Skin:  Scalp incision c/d/i  Psychiatric: His speech is delayed. He is slowed.    No results found for this or any previous visit (from the past 24 hour(s)). No results found.  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  Records reviewed and summated  above.  1. Does the need for close, 24 hr/day medical supervision in concert with the patient's rehab needs make it unreasonable for this patient to be served in a less intensive setting? Yes  2. Co-Morbidities requiring supervision/potential complications: OA (ensure pain does not limit therapies), GERD, macular degeneration, progressive numbness BLE, seizure prophylaxis (cont meds), leukocytosis (cont to monitor for signs and symptoms of infection, further workup if indicated), ABLA (transfuse if necessary to ensure appropriate perfusion for increased activity tolerance) 3. Due to safety, skin/wound care, disease management, pain management and patient education, does the patient require 24 hr/day rehab nursing? Yes 4. Does the patient require coordinated care of a physician, rehab nurse, PT (1-2 hrs/day, 5 days/week), OT (1-2 hrs/day, 5 days/week) and SLP (1-2 hrs/day, 5 days/week) to address physical and functional deficits in the context of the above medical diagnosis(es)? Yes Addressing deficits in the following areas: balance, endurance, locomotion, strength, transferring, bathing, dressing, toileting, cognition, language and psychosocial support 5. Can the patient actively participate in an intensive therapy program of at least 3 hrs of therapy per day at least 5 days per week? Yes 6. The potential for patient to make measurable gains while on inpatient rehab is excellent 7. Anticipated functional outcomes upon discharge from inpatient rehab are min assist  with PT, min assist with OT, modified independent with SLP. 8. Estimated rehab length of stay to reach the above functional goals is: 15-19 days. 9. Anticipated D/C setting: Home 10. Anticipated post D/C treatments: HH therapy and Home excercise program 11. Overall Rehab/Functional Prognosis: good  RECOMMENDATIONS: This patient's condition is appropriate for continued rehabilitative care in the following setting: CIR Patient has agreed to  participate in recommended program. Yes Note that insurance prior authorization may be required for reimbursement for recommended care.  Comment: Rehab Admissions Coordinator to follow up.   I have personally performed a face to face diagnostic evaluation, including, but not limited to relevant history and physical exam findings, of this patient and developed relevant assessment and plan.  Additionally, I have reviewed and concur with the physician assistant's documentation above.   Delice Lesch, MD, ABPMR Bary Leriche, PA-C 08/23/2018

## 2018-08-20 NOTE — Evaluation (Signed)
Occupational Therapy Evaluation Patient Details Name: James Olsen MRN: [id number] DOB: July 10, 1946 Today's Date: 08/20/2018    History of Present Illness 72 yo male s/p craniotomy for tumor resection of meningioma PMH: tonsillectomy, pilonidal cystectomy, bursectomy, laparoscopic cholecystectomy, endoscopic gallstone removal, removal of distal tip of the index finger, laser reduction of pressure of both eyes   Clinical Impression   Patient is s/p craniotomy surgery resulting in functional limitations due to the deficits listed below (see OT problem list). PT currently with R side deficits affecting all adls and balance. Pt require total +2 mod (A) for basic transfers. Pt with ability to advance R LE with incr time and effort.  Patient will benefit from skilled OT acutely to increase independence and safety with ADLS to allow discharge CIR.     Follow Up Recommendations  CIR    Equipment Recommendations  3 in 1 bedside commode    Recommendations for Other Services Rehab consult     Precautions / Restrictions Precautions Precautions: Fall Restrictions Weight Bearing Restrictions: No      Mobility Bed Mobility Overal bed mobility: Needs Assistance Bed Mobility: Rolling;Supine to Sit Rolling: Min guard   Supine to sit: Mod assist     General bed mobility comments: pt able to advance to EOB but requires (A) to bring R LE to eob. pt needs (A) to advance R hip to EOBq  Transfers Overall transfer level: Needs assistance Equipment used: 2 person hand held assist Transfers: Sit to/from Stand Sit to Stand: +2 physical assistance;Min assist;+2 safety/equipment         General transfer comment: pt requires cues for positioning and righting to center.     Balance Overall balance assessment: Needs assistance;Mild deficits observed, not formally tested Sitting-balance support: Single extremity supported;Feet supported Sitting balance-Leahy Scale: Fair       Standing  balance-Leahy Scale: Poor                             ADL either performed or assessed with clinical judgement   ADL Overall ADL's : Needs assistance/impaired Eating/Feeding: Set up;Sitting   Grooming: Wash/dry face;Set up;Sitting   Upper Body Bathing: Minimal assistance;Sitting   Lower Body Bathing: Maximal assistance   Upper Body Dressing : Minimal assistance   Lower Body Dressing: Maximal assistance   Toilet Transfer: +2 for physical assistance;+2 for safety/equipment;Minimal assistance;BSC;Stand-pivot   Toileting- Clothing Manipulation and Hygiene: Total assistance       Functional mobility during ADLs: +2 for physical assistance;Moderate assistance;+2 for safety/equipment General ADL Comments: pt requires hand held (A) and mod cues for weight shift to the left to advance the R LE     Vision Baseline Vision/History: Wears glasses Wears Glasses: At all times       Perception     Praxis      Pertinent Vitals/Pain Pain Assessment: No/denies pain     Hand Dominance Left   Extremity/Trunk Assessment Upper Extremity Assessment Upper Extremity Assessment: RUE deficits/detail RUE Deficits / Details: pt with noted grasp x1 but unable to repeat activation. pt unable to release once initiated hand grasp pt unable to elevate scapula or complete any FF of shoulder, no elbow or wrist noted RUE Sensation: WNL RUE Coordination: decreased gross motor;decreased fine motor   Lower Extremity Assessment Lower Extremity Assessment: RLE deficits/detail;Defer to PT evaluation   Cervical / Trunk Assessment Cervical / Trunk Assessment: Normal   Communication Communication Communication: No difficulties   Cognition Arousal/Alertness:  Awake/alert Behavior During Therapy: WFL for tasks assessed/performed Overall Cognitive Status: Within Functional Limits for tasks assessed                                 General Comments: Pt with delayed response due  to expressive deficits   General Comments  pt educated on elevated R UE and placing towel in hand for functional positioning. to continue to montior for splint needs    Exercises     Shoulder Instructions      Home Living Family/patient expects to be discharged to:: Private residence Living Arrangements: Spouse/significant other Available Help at Discharge: Family;Available 24 hours/day Type of Home: House Home Access: Stairs to enter [employer] of Steps: 3 Entrance Stairs-Rails: None Home Layout: Two level;Able to live on main level with bedroom/bathroom     Bathroom Shower/Tub: Occupational psychologist: Standard     Home Equipment: Shower seat - built in          Prior Functioning/Environment Level of Independence: Independent                 OT Problem List: Decreased strength;Decreased activity tolerance;Decreased range of motion;Impaired balance (sitting and/or standing);Decreased coordination;Decreased safety awareness;Decreased knowledge of use of DME or AE;Decreased knowledge of precautions;Impaired UE functional use;Increased edema;Impaired tone      OT Treatment/Interventions: Self-care/ADL training;Therapeutic exercise;Neuromuscular education;Energy conservation;DME and/or AE instruction;Manual therapy;Modalities;Therapeutic activities;Splinting;Visual/perceptual remediation/compensation;Patient/family education;Balance training    OT Goals(Current goals can be found in the care plan section) Acute Rehab OT Goals Patient Stated Goal: to get my hand back OT Goal Formulation: With patient Time For Goal Achievement: 09/03/18 Potential to Achieve Goals: Good  OT Frequency: Min 3X/week   Barriers to D/C:            Co-evaluation PT/OT/SLP Co-Evaluation/Treatment: Yes Reason for Co-Treatment: For patient/therapist safety;To address functional/ADL transfers   OT goals addressed during session: ADL's and self-care;Proper use of  Adaptive equipment and DME;Strengthening/ROM      AM-PAC PT "6 Clicks" Daily Activity     Outcome Measure Help from another person eating meals?: A Little Help from another person taking care of personal grooming?: A Lot Help from another person toileting, which includes using toliet, bedpan, or urinal?: A Lot Help from another person bathing (including washing, rinsing, drying)?: A Lot Help from another person to put on and taking off regular upper body clothing?: A Lot Help from another person to put on and taking off regular lower body clothing?: A Lot 6 Click Score: 13   End of Session Equipment Utilized During Treatment: Gait belt Nurse Communication: Mobility status;Precautions  Activity Tolerance: Patient tolerated treatment well Patient left: in chair;with call bell/phone within reach;with chair alarm set  OT Visit Diagnosis: Unsteadiness on feet (R26.81);Muscle weakness (generalized) (M62.81);Hemiplegia and hemiparesis Hemiplegia - Right/Left: Right Hemiplegia - dominant/non-dominant: Non-Dominant                Time: 6160-7371 OT Time Calculation (min): 34 min Charges:  OT General Charges $OT Visit: 1 Visit OT Evaluation $OT Eval Moderate Complexity: 1 Mod   Jeri Modena, OTR/L  Acute Rehabilitation Services Pager: [phone] Office: [phone] .   Parke Poisson B 08/20/2018, 10:06 AM

## 2018-08-20 NOTE — Progress Notes (Signed)
Rehab Admissions Coordinator Note:  Patient was screened by Cleatrice Burke for appropriateness for an Inpatient Acute Rehab Consult per OT recommendation.   At this time, we are recommending Inpatient Rehab consult. Please place order.  Cleatrice Burke 08/20/2018, 10:09 AM  I can be reached at [phone].

## 2018-08-20 NOTE — Anesthesia Postprocedure Evaluation (Signed)
Anesthesia Post Note  Patient: James Olsen  Procedure(s) Performed: CRANIOTOMY TUMOR EXCISION (N/A Head)     Patient location during evaluation: PACU Anesthesia Type: General Level of consciousness: awake Pain management: pain level controlled Vital Signs Assessment: post-procedure vital signs reviewed and stable Respiratory status: spontaneous breathing Postop Assessment: no apparent nausea or vomiting Anesthetic complications: no    Last Vitals:  Vitals:   08/20/18 0338 08/20/18 0400  BP:  125/66  Pulse: 79 71  Resp: 16 10  Temp: 36.8 C   SpO2: 96% 94%    Last Pain:  Vitals:   08/20/18 0400  TempSrc:   PainSc: 0-No pain                 Larrisa Cravey

## 2018-08-20 NOTE — Evaluation (Signed)
Physical Therapy Evaluation Patient Details Name: James Olsen MRN: [id number] DOB: [birth year] Today's Date: 08/20/2018   History of Present Illness  72 yo male s/p craniotomy for tumor resection of meningioma PMH: tonsillectomy, pilonidal cystectomy, bursectomy, laparoscopic cholecystectomy, endoscopic gallstone removal, removal of distal tip of the index finger, laser reduction of pressure of both eyes  Clinical Impression  Patient presents with decreased independence and safety with mobility due to R side weakness, increased tone, decreased balance with increased R weight shift, decreased safety awareness and will benefit from skilled PT in the acute setting to allow return home with wife assist following CIR level rehab stay.    Follow Up Recommendations CIR    Equipment Recommendations  Other (comment)(TBA)    Recommendations for Other Services Rehab consult     Precautions / Restrictions Precautions Precautions: Fall      Mobility  Bed Mobility Overal bed mobility: Needs Assistance Bed Mobility: Rolling;Supine to Sit Rolling: Min guard   Supine to sit: Mod assist     General bed mobility comments: pt able to advance to EOB but requires (A) to bring R LE to eob. pt needs (A) to advance R hip to EOBq  Transfers Overall transfer level: Needs assistance Equipment used: 2 person hand held assist Transfers: Sit to/from Stand Sit to Stand: +2 physical assistance;Min assist;+2 safety/equipment         General transfer comment: pt requires cues for positioning and righting to center.   Ambulation/Gait Ambulation/Gait assistance: Mod assist;+2 safety/equipment;+2 physical assistance Gait Distance (Feet): 70 Feet Assistive device: 2 person hand held assist Gait Pattern/deviations: Step-to pattern;Step-through pattern;Decreased stride length;Decreased dorsiflexion - right;Shuffle;Decreased weight shift to left     General Gait Details: facilitation for weight  shift, cues and assist for heel strike on R and for step length, cues and time for turns and maneuvering around obstacles in the room; assist for balance and L weight shift to allow for R swing   Stairs            Wheelchair Mobility    Modified Rankin (Stroke Patients Only)       Balance Overall balance assessment: Needs assistance;Mild deficits observed, not formally tested Sitting-balance support: Single extremity supported;Feet supported Sitting balance-Leahy Scale: Fair       Standing balance-Leahy Scale: Poor Standing balance comment: assist to find center for balance in standing                             Pertinent Vitals/Pain      Home Living                        Prior Function                 Hand Dominance        Extremity/Trunk Assessment   Upper Extremity Assessment Upper Extremity Assessment: Defer to OT evaluation    Lower Extremity Assessment Lower Extremity Assessment: RLE deficits/detail RLE Deficits / Details: increased tone noted with AAROM grossly 2/4 on Modified [place]; lifts leg anitgravity from hip and knee, ankle DF limited, but moves through partial range RLE Sensation: decreased light touch       Communication      Cognition Arousal/Alertness: Awake/alert Behavior During Therapy: WFL for tasks assessed/performed Overall Cognitive Status: Within Functional Limits for tasks assessed  General Comments: Pt with delayed response due to expressive deficits      General Comments      Exercises     Assessment/Plan    PT Assessment Patient needs continued PT services  PT Problem List Decreased strength;Decreased mobility;Impaired tone;Decreased coordination;Decreased balance;Decreased knowledge of use of DME;Impaired sensation       PT Treatment Interventions DME instruction;Therapeutic activities;Gait training;Therapeutic exercise;Patient/family  education;Balance training;Functional mobility training;Neuromuscular re-education    PT Goals (Current goals can be found in the Care Plan section)  Acute Rehab PT Goals Patient Stated Goal: to go to rehab PT Goal Formulation: With patient Time For Goal Achievement: 09/03/18 Potential to Achieve Goals: Good    Frequency Min 4X/week   Barriers to discharge        Co-evaluation PT/OT/SLP Co-Evaluation/Treatment: Yes Reason for Co-Treatment: For patient/therapist safety;To address functional/ADL transfers PT goals addressed during session: Mobility/safety with mobility;Balance;Strengthening/ROM         AM-PAC PT "6 Clicks" Daily Activity  Outcome Measure Difficulty turning over in bed (including adjusting bedclothes, sheets and blankets)?: Unable Difficulty moving from lying on back to sitting on the side of the bed? : Unable Difficulty sitting down on and standing up from a chair with arms (e.g., wheelchair, bedside commode, etc,.)?: Unable Help needed moving to and from a bed to chair (including a wheelchair)?: A Lot Help needed walking in hospital room?: A Lot Help needed climbing 3-5 steps with a railing? : A Lot 6 Click Score: 9    End of Session Equipment Utilized During Treatment: Gait belt Activity Tolerance: Patient tolerated treatment well Patient left: with call bell/phone within reach;with chair alarm set;in chair Nurse Communication: Mobility status PT Visit Diagnosis: Other abnormalities of gait and mobility (R26.89);Other symptoms and signs involving the nervous system (R29.898);Hemiplegia and hemiparesis Hemiplegia - Right/Left: Right Hemiplegia - dominant/non-dominant: Dominant Hemiplegia - caused by: Unspecified    Time: 1638-4536 PT Time Calculation (min) (ACUTE ONLY): 34 min   Charges:   PT Evaluation $PT Eval Moderate Complexity: [place], [place] [phone] 08/20/2018   Reginia Naas 08/20/2018, 1:48 PM

## 2018-08-20 NOTE — Progress Notes (Signed)
PHARMACIST - PHYSICIAN COMMUNICATION  DR:  Christella Noa  CONCERNING: IV to Oral Route Change Policy  RECOMMENDATION: This patient is receiving Keppra by the intravenous route.  Based on criteria approved by the Pharmacy and Therapeutics Committee, the intravenous medication(s) is/are being converted to the equivalent oral dose form(s).   DESCRIPTION: These criteria include:  The patient is eating (either orally or via tube) and/or has been taking other orally administered medications for a least 24 hours  The patient has no evidence of active gastrointestinal bleeding or impaired GI absorption (gastrectomy, short bowel, patient on TNA or NPO).  If you have questions about this conversion, please contact the Pharmacy Department  []   [phone] )  [facility] []   [phone] )  [facility] [x]   [phone] )  [facility] []   [phone] )  [facility] []   [phone] )  [place], PharmD, [place] Clinical Pharmacist Please see AMION for all Pharmacists' Contact Phone Numbers 08/20/2018, 10:20 AM

## 2018-08-21 MED ORDER — CALCIUM CARBONATE ANTACID 500 MG PO CHEW
1.0000 | CHEWABLE_TABLET | Freq: Every day | ORAL | Status: DC
  Administered 2018-08-21 – 2018-08-24 (×4): 200 mg via ORAL
  Filled 2018-08-21 (×4): qty 1

## 2018-08-21 NOTE — Progress Notes (Signed)
Subjective: Patient reports improving  Objective: Vital signs in last 24 hours: Temp:  [97.9 F (36.6 C)-98.7 F (37.1 C)] 98.4 F (36.9 C) (08/31 0800) Pulse Rate:  [55-88] 75 (08/31 1000) Resp:  [11-27] 19 (08/31 1000) BP: (107-134)/(59-79) 117/69 (08/31 1000) SpO2:  [94 %-99 %] 97 % (08/31 1000)  Intake/Output from previous day: 08/30 0701 - 08/31 0700 In: 1879.5 [I.V.:1779.5; IV Piggyback:100] Out: 1300 [Urine:1300] Intake/Output this shift: Total I/O In: 139.6 [I.V.:139.6] Out: 200 [Urine:200]  Physical Exam: Patient has ambulated to bathroom with assistance.  He has minimal use of his right upper extremity, good strength in leg.  Left sided strength is full.  Dressing CDI. Alert, conversant, fully oriented.  Lab Results: Recent Labs    08/18/18 1918 08/19/18 0259  WBC 13.5* 11.7*  HGB 10.5* 11.7*  HCT 31.9* 34.5*  PLT 117* 104*   BMET Recent Labs    08/18/18 1353 08/18/18 1505 08/18/18 1918  NA 142 141  --   K 3.7 4.4  --   CREATININE  --   --  0.80    Studies/Results: Mr Jeri Cos Wo Contrast  Result Date: 08/19/2018 CLINICAL DATA:  72 y/o M; craniotomy and tumor resection for follow-up. EXAM: MRI HEAD WITHOUT AND WITH CONTRAST TECHNIQUE: Multiplanar, multiecho pulse sequences of the brain and surrounding structures were obtained without and with intravenous contrast. CONTRAST:  62mL MULTIHANCE GADOBENATE DIMEGLUMINE 529 MG/ML IV SOLN COMPARISON:  07/15/2018 MRI head.  08/18/2018 CT head. FINDINGS: Brain: Interval resection of a left superior frontal meningioma with craniectomy. The resection cavity is filled with fluid, foci of air, and blood products with susceptibility blooming. There is a small rim of reduced diffusion at the inferior margin of the cavity extending to the ventricle compatible with acute ischemia. There is residual neoplasm at the superomedial aspect of the resection cavity along the falx and invading the superior sagittal sinus which  measures 3.3 x 2.1 x 1.7 cm (AP x ML x CC series 17, image 15 and series 16, image 48). There is stable vasogenic edema within subcortical white matter of the left superior frontal gyrus. Diminished mass effect on the brain with very slight effacement of the left lateral ventricle which is displaced inferiorly period no significant midline shift. A small portion of the left middle frontal gyrus slightly herniates through the craniectomy defect (series 15, image 16). No new focus of abnormal enhancement, hemorrhage, or focal mass effect elsewhere in the brain. Mild smooth diffuse dural enhancement is compatible with recent postsurgical changes. Vascular: Normal flow voids. Skull and upper cervical spine: Postsurgical changes related to a left superior frontal craniectomy with mild edema and enhancement in the overlying scalp. Sinuses/Orbits: Left maxillary sinus mucous retention cyst. Otherwise there is no significant abnormal signal of the paranasal sinuses or mastoid air cells. Other: None. IMPRESSION: 1. Interval left superior frontal craniectomy and tumor resection. 2. Residual neoplasm at the superomedial aspect of the resection cavity along the falx and invading superior sagittal sinus measuring 3.2 x 2.1 x 1.7 cm (AP x ML x CC). 3. Small focus of acute ischemia at the inferior cavity margin extending to the ventricle. 4. Slight herniation of left middle frontal gyrus through the craniectomy defect. 5. Stable edema within the left superior frontal lobe white matter. Improved mass effect on the brain post resection. Electronically Signed   By: Kristine Garbe M.D.   On: 08/19/2018 23:18    Assessment/Plan: Patient is improving.  He will benefit from inpatient Rehab.  LOS: 3 days    Peggyann Shoals, MD 08/21/2018, 10:45 AM

## 2018-08-22 MED ORDER — SIMETHICONE 80 MG PO CHEW
80.0000 mg | CHEWABLE_TABLET | Freq: Four times a day (QID) | ORAL | Status: DC | PRN
  Administered 2018-08-22 – 2018-08-24 (×4): 80 mg via ORAL
  Filled 2018-08-22 (×4): qty 1

## 2018-08-22 NOTE — Progress Notes (Signed)
  NEUROSURGERY PROGRESS NOTE   Pt seen and examined. No issues overnight. No HA, visual changes. Stable right hemiparesis.  EXAM: Temp:  [97.8 F (36.6 C)-98.3 F (36.8 C)] 97.8 F (36.6 C) (09/01 0753) Pulse Rate:  [54-74] 71 (09/01 1452) Resp:  [10-18] 12 (09/01 1452) BP: (95-146)/(60-106) 139/68 (09/01 1452) SpO2:  [95 %-99 %] 99 % (09/01 1452) Intake/Output      08/31 0701 - 09/01 0700 09/01 0701 - 09/02 0700   P.O. 360 120   I.V. (mL/kg) 139.6 (1.5)    IV Piggyback     Total Intake(mL/kg) 499.6 (5.5) 120 (1.3)   Urine (mL/kg/hr) 825 (0.4)    Stool 0    Total Output 825    Net -325.4 +120        Urine Occurrence 1 x 2 x   Stool Occurrence 2 x     Awake, alert, oriented Speech fluent, appropriate 5/5 LUE/LLE No real voluntary movement RUE 4/5 proximal LLE, 4/4 knee ext, 0/5 PF/DF Wound c/d/i  LABS: Lab Results  Component Value Date   CREATININE 0.80 08/18/2018   BUN 17 08/10/2018   NA 141 08/18/2018   K 4.4 08/18/2018   CL 106 08/10/2018   CO2 24 08/10/2018   Lab Results  Component Value Date   WBC 11.7 (H) 08/19/2018   HGB 11.7 (L) 08/19/2018   HCT 34.5 (L) 08/19/2018   MCV 88.0 08/19/2018   PLT 104 (L) 08/19/2018    IMPRESSION: - 72 y.o. male POD# 4 s/p resection of left frontal meningioma, stable postop primarily right UE paresis.  PLAN: - Can transfer to stepdown - Cont PT/OT - Cont Keppra, dexamethasone. Can start to taper steroids tomorrow

## 2018-08-23 DIAGNOSIS — D320 Benign neoplasm of cerebral meninges: Principal | ICD-10-CM

## 2018-08-23 DIAGNOSIS — I69351 Hemiplegia and hemiparesis following cerebral infarction affecting right dominant side: Secondary | ICD-10-CM

## 2018-08-23 DIAGNOSIS — M159 Polyosteoarthritis, unspecified: Secondary | ICD-10-CM

## 2018-08-23 DIAGNOSIS — D499 Neoplasm of unspecified behavior of unspecified site: Secondary | ICD-10-CM

## 2018-08-23 DIAGNOSIS — K219 Gastro-esophageal reflux disease without esophagitis: Secondary | ICD-10-CM

## 2018-08-23 DIAGNOSIS — D62 Acute posthemorrhagic anemia: Secondary | ICD-10-CM

## 2018-08-23 DIAGNOSIS — D72829 Elevated white blood cell count, unspecified: Secondary | ICD-10-CM

## 2018-08-23 DIAGNOSIS — Z298 Encounter for other specified prophylactic measures: Secondary | ICD-10-CM

## 2018-08-23 MED ORDER — DEXAMETHASONE 2 MG PO TABS
2.0000 mg | ORAL_TABLET | Freq: Three times a day (TID) | ORAL | Status: DC
  Administered 2018-08-23 – 2018-08-24 (×5): 2 mg via ORAL
  Filled 2018-08-23 (×5): qty 1

## 2018-08-23 NOTE — Progress Notes (Signed)
Physical Therapy Treatment Patient Details Name: James Olsen MRN: [id number] DOB: 1946/07/24 Today's Date: 08/23/2018    History of Present Illness 72 yo male s/p craniotomy for tumor resection of meningioma PMH: tonsillectomy, pilonidal cystectomy, bursectomy, laparoscopic cholecystectomy, endoscopic gallstone removal, removal of distal tip of the index finger, laser reduction of pressure of both eyes    PT Comments    Patient seen for activity progression with focus on gait retraining. Patient tolerated in hall ambulation with manual facilitation and multi modal cues. +2 physical assist to maintain upright throughout session. Noted improvements in rhythm and stride over the course of session. At this time, feel patient continues to progress well with therapies and remains ideal candidate for comprehensive program. Will continue to see and progress as tolerated.   Follow Up Recommendations  CIR     Equipment Recommendations  Other (comment)(TBA)    Recommendations for Other Services Rehab consult     Precautions / Restrictions Precautions Precautions: Fall Restrictions Weight Bearing Restrictions: No    Mobility  Bed Mobility               General bed mobility comments: patient received in chair  Transfers Overall transfer level: Needs assistance Equipment used: 2 person hand held assist Transfers: Sit to/from Stand Sit to Stand: +2 physical assistance;Min assist;+2 safety/equipment         General transfer comment: Patient required 2 person min assist to pwer up from chair with manual faciliation of RLE position prior to initiating anterior translation and power to stand. Increased assist for stability in standing  Ambulation/Gait Ambulation/Gait assistance: Mod assist;+2 safety/equipment;+2 physical assistance Gait Distance (Feet): 80 Feet Assistive device: 2 person hand held assist Gait Pattern/deviations: Step-to pattern;Step-through pattern;Decreased  stride length;Decreased dorsiflexion - right;Shuffle;Decreased weight shift to left;Narrow base of support Gait velocity: decreased Gait velocity interpretation: <1.8 ft/sec, indicate of risk for recurrent falls General Gait Details: Manual faciliation for weight shift and RLE stride during gait. +2 wrap around assist with gait belt. VCs for rhythm and placement of RLE. Increased time and effort noted, improvements in fluidity of movement after one seated rest break   Stairs             Wheelchair Mobility    Modified Rankin (Stroke Patients Only) Modified Rankin (Stroke Patients Only) Pre-Morbid Rankin Score: No symptoms Modified Rankin: Moderately severe disability     Balance Overall balance assessment: Needs assistance;Mild deficits observed, not formally tested Sitting-balance support: Single extremity supported;Feet supported Sitting balance-Leahy Scale: Fair     Standing balance support: During functional activity;Bilateral upper extremity supported(via HHA) Standing balance-Leahy Scale: Poor Standing balance comment: Assist for stability, noted lateral bias and relaince on bilateral UE support in standing. decreased ankle strategy RLE impeding ability to maintain static balance                            Cognition Arousal/Alertness: Awake/alert Behavior During Therapy: WFL for tasks assessed/performed Overall Cognitive Status: Within Functional Limits for tasks assessed                                 General Comments: Pt with some noted delay in response time and some expressive perseveration at times      Exercises      General Comments General comments (skin integrity, edema, etc.): educated on importance of weight shift and flow during recipricol movements  Pertinent Vitals/Pain Pain Assessment: No/denies pain    Home Living                      Prior Function            PT Goals (current goals can now be  found in the care plan section) Acute Rehab PT Goals Patient Stated Goal: to go to rehab PT Goal Formulation: With patient Time For Goal Achievement: 09/03/18 Potential to Achieve Goals: Good Progress towards PT goals: Progressing toward goals    Frequency    Min 4X/week      PT Plan Current plan remains appropriate    Co-evaluation PT/OT/SLP Co-Evaluation/Treatment: Yes Reason for Co-Treatment: Complexity of the patient's impairments (multi-system involvement);Necessary to address cognition/behavior during functional activity;For patient/therapist safety PT goals addressed during session: Mobility/safety with mobility;Balance OT goals addressed during session: ADL's and self-care      AM-PAC PT "6 Clicks" Daily Activity  Outcome Measure  Difficulty turning over in bed (including adjusting bedclothes, sheets and blankets)?: Unable Difficulty moving from lying on back to sitting on the side of the bed? : Unable Difficulty sitting down on and standing up from a chair with arms (e.g., wheelchair, bedside commode, etc,.)?: Unable Help needed moving to and from a bed to chair (including a wheelchair)?: A Lot Help needed walking in hospital room?: A Lot Help needed climbing 3-5 steps with a railing? : A Lot 6 Click Score: 9    End of Session Equipment Utilized During Treatment: Gait belt Activity Tolerance: Patient tolerated treatment well Patient left: with call bell/phone within reach;with chair alarm set;in chair Nurse Communication: Mobility status PT Visit Diagnosis: Other abnormalities of gait and mobility (R26.89);Other symptoms and signs involving the nervous system (R29.898);Hemiplegia and hemiparesis Hemiplegia - Right/Left: Right Hemiplegia - dominant/non-dominant: Dominant Hemiplegia - caused by: Unspecified     Time: 0768-0881 PT Time Calculation (min) (ACUTE ONLY): 27 min  Charges:  $Gait Training: 8-22 mins                     Alben Deeds, PT DPT   Board Certified Neurologic Specialist [place] 08/23/2018, 3:00 PM

## 2018-08-23 NOTE — Progress Notes (Addendum)
Neurosurgery Progress Note  Patient slid onto floor yesterday. No injury. Denies pain this am No HA, changes in vision. Subjective improvement in right hemi  EXAM:  BP (!) 111/93 (BP Location: Left Arm)   Pulse 67   Temp 98.1 F (36.7 C) (Oral)   Resp 12   Ht 5\' 9"  (1.753 m)   Wt 90.4 kg   SpO2 98%   BMI 29.42 kg/m   Awake, alert, oriented  Speech fluent, appropriate  LUE/LLE: 5/5 RUE: 0/5 RLE: 1/5 right foot, 4/5 IP, quad Wound c/d/i  PLAN Stable right hemiparesis. Continue PT/OT Will taper steroids

## 2018-08-23 NOTE — Progress Notes (Addendum)
Inpatient Rehabilitation  Met with patient and spouse at bedside to discuss team's recommendation for IP Rehab.  Shared booklets, insurance verification letter, and answered initial questions.  Plan to follow for timing of medical readiness and IP Rehab bed availability.  Hopeful for tomorrow 9/3.  Call if questions.    Carmelia Roller., CCC/SLP Admission Coordinator  [place]  Cell [phone]

## 2018-08-23 NOTE — Progress Notes (Signed)
Occupational Therapy Treatment Patient Details Name: James Olsen MRN: [id number] DOB: 1946-05-06 Today's Date: 08/23/2018    History of present illness 72 yo male s/p craniotomy for tumor resection of meningioma PMH: tonsillectomy, pilonidal cystectomy, bursectomy, laparoscopic cholecystectomy, endoscopic gallstone removal, removal of distal tip of the index finger, laser reduction of pressure of both eyes   OT comments  Pt progressing towards OT goals this session. Edema noted in the RUE and educated Pt and wife on PROM at fingers, wrist, elbow, and shoulder. Encouraged Pt to use LUE to assist with PROM. Verbal Order for Right resting hand splint to protect integrity of R wrist. Pt was able to perform transfers with min A+2 and ambulation mod A +2 assist with balance facilitation. Pt is highly motivated and continues to benefit from acute therapy as well as CIR level therapy post-acute to maximize safety and independence in ADL and functional transfers.     Follow Up Recommendations  CIR    Equipment Recommendations  3 in 1 bedside commode    Recommendations for Other Services Rehab consult    Precautions / Restrictions Precautions Precautions: Fall Restrictions Weight Bearing Restrictions: No       Mobility Bed Mobility               General bed mobility comments: patient received in chair  Transfers Overall transfer level: Needs assistance Equipment used: 2 person hand held assist Transfers: Sit to/from Stand Sit to Stand: +2 physical assistance;Min assist;+2 safety/equipment         General transfer comment: Patient required 2 person min assist to pwer up from chair with manual faciliation of RLE position prior to initiating anterior translation and power to stand. Increased assist for stability in standing    Balance Overall balance assessment: Needs assistance;Mild deficits observed, not formally tested Sitting-balance support: Single extremity  supported;Feet supported Sitting balance-Leahy Scale: Fair     Standing balance support: During functional activity;Bilateral upper extremity supported(via HHA) Standing balance-Leahy Scale: Poor Standing balance comment: Assist for stability, noted lateral bias and relaince on bilateral UE support in standing. decreased ankle strategy RLE impeding ability to maintain static balance                           ADL either performed or assessed with clinical judgement   ADL Overall ADL's : Needs assistance/impaired     Grooming: Wash/dry face;Set up;Sitting Grooming Details (indicate cue type and reason): in recliner                 Toilet Transfer: Moderate assistance;+2 for physical assistance;+2 for safety/equipment;Ambulation   Toileting- Clothing Manipulation and Hygiene: Total assistance       Functional mobility during ADLs: +2 for physical assistance;Moderate assistance;+2 for safety/equipment General ADL Comments: pt requires hand held (A) and mod cues for weight shift to the left to advance the R LE     Vision       Perception     Praxis      Cognition Arousal/Alertness: Awake/alert Behavior During Therapy: WFL for tasks assessed/performed Overall Cognitive Status: Within Functional Limits for tasks assessed                                 General Comments: Pt with some noted delay in response time and some expressive perseveration at times        Exercises Exercises: General  Upper Extremity General Exercises - Upper Extremity Shoulder Flexion: PROM;Right;10 reps;Seated Shoulder Extension: PROM;Right;10 reps;Seated Shoulder ABduction: PROM;10 reps;Seated;Right Elbow Flexion: PROM;10 reps;Seated;Right Elbow Extension: PROM;10 reps;Seated;Right Wrist Flexion: PROM;10 reps;Seated;Right Wrist Extension: PROM;10 reps;Seated;Right Digit Composite Flexion: PROM;10 reps;Seated;Right Composite Extension: PROM;Right;10 reps;Seated    Shoulder Instructions       General Comments educated on importance of weight shift and flow during recipricol movements     Pertinent Vitals/ Pain       Pain Assessment: No/denies pain  Home Living                                          Prior Functioning/Environment              Frequency  Min 3X/week        Progress Toward Goals  OT Goals(current goals can now be found in the care plan section)  Progress towards OT goals: Progressing toward goals  Acute Rehab OT Goals Patient Stated Goal: to go to rehab OT Goal Formulation: With patient/family Time For Goal Achievement: 09/03/18 Potential to Achieve Goals: Good  Plan Discharge plan remains appropriate;Frequency remains appropriate    Co-evaluation    PT/OT/SLP Co-Evaluation/Treatment: Yes Reason for Co-Treatment: Complexity of the patient's impairments (multi-system involvement);Necessary to address cognition/behavior during functional activity;For patient/therapist safety PT goals addressed during session: Mobility/safety with mobility;Balance OT goals addressed during session: ADL's and self-care      AM-PAC PT "6 Clicks" Daily Activity     Outcome Measure   Help from another person eating meals?: A Little Help from another person taking care of personal grooming?: A Lot Help from another person toileting, which includes using toliet, bedpan, or urinal?: A Lot Help from another person bathing (including washing, rinsing, drying)?: A Lot Help from another person to put on and taking off regular upper body clothing?: A Lot Help from another person to put on and taking off regular lower body clothing?: A Lot 6 Click Score: 13    End of Session Equipment Utilized During Treatment: Gait belt  OT Visit Diagnosis: Unsteadiness on feet (R26.81);Muscle weakness (generalized) (M62.81);Hemiplegia and hemiparesis Hemiplegia - Right/Left: Right Hemiplegia - dominant/non-dominant:  Non-Dominant   Activity Tolerance Patient tolerated treatment well   Patient Left in chair;with call bell/phone within reach;with chair alarm set;with family/visitor present   Nurse Communication Mobility status;Precautions        Time: 8938-1017 OT Time Calculation (min): 27 min  Charges: OT General Charges $OT Visit: 1 Visit OT Treatments $Self Care/Home Management : 8-22 mins Hulda Humphrey OTR/L Acute Rehabilitation Services Pager: [phone] Office: [place] 08/23/2018, 3:10 PM

## 2018-08-23 NOTE — Care Management Important Message (Signed)
Important Message  Patient Details  Name: James Olsen MRN: [id number] Date of Birth: June 13, 1946   Medicare Important Message Given:  Yes    Orbie Pyo 08/23/2018, 3:27 PM

## 2018-08-23 NOTE — Progress Notes (Signed)
  Speech Language Pathology Treatment: Cognitive-Linquistic  Patient Details Name: James Olsen MRN: [id number] DOB: 1946-01-12 Today's Date: 08/23/2018 Time: 1120-1210 SLP Time Calculation (min) (ACUTE ONLY): 50 min  Assessment / Plan / Recommendation Clinical Impression  Session focused on improving grammatical form during structured speech tasks.  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  His baseline vocabulary is a strength, but pt requires cues to hone in on topics and improve specificity.  Pt engaged in verbal sequencing/description tasks, needing initial carrier phrase "I would" to begin sentence in structured way and cues 50% of time to use full grammatical form and to include details of each step (e.g, making coffee, preparing scrambled eggs).  Pt demonstrated improvement by end of session.  He was left with a homework/writing task.  Continue aphasia therapy while on acute care.       HPI HPI: Pt is a 72 year old male noticed to have a lump under his scalp by his hairdresser. 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      SLP Plan  Continue with current plan of care       Recommendations                   Oral Care Recommendations: Oral care BID Follow up Recommendations: Inpatient Rehab Plan: Continue with current plan of care       GO               James Olsen, [place] CCC/SLP Pager [phone]  James Olsen 08/23/2018, 12:21 PM

## 2018-08-24 DIAGNOSIS — D696 Thrombocytopenia, unspecified: Secondary | ICD-10-CM

## 2018-08-24 NOTE — Progress Notes (Addendum)
Inpatient Rehabilitation  I have a rehab bed available to offer patient today.  Await medical clearance prior to hopeful admission today.  I have paged Dr. Christella Noa to request and will need to have a decision by 4:30pm in order to arrange it for today.  Plan to update team when I know.  Call if questions.   Update: Called office and gave message to [facility] regarding request for IP Rehab admission today.    Update: Received call from Dr. Christella Noa ok to proceed with admission to IP Rehab today.  I will make arrangements.  Call if questions.    Carmelia Roller., CCC/SLP Admission Coordinator  [place]  Cell [phone]

## 2018-08-24 NOTE — Progress Notes (Signed)
Orthopedic Tech Progress Note Patient Details:  James Olsen 1946/10/15 [id number]  Patient ID: [name], male   DOB: Mar 31, 1946, 72 y.o.   MRN: [id number]   [place] Pink 08/24/2018, 7:57 AMCalled [facility] for resting hand.

## 2018-08-24 NOTE — Progress Notes (Signed)
Physical Therapy Treatment Patient Details Name: James Olsen MRN: [id number] DOB: 21-Jul-1946 Today's Date: 08/24/2018    History of Present Illness 72 yo male s/p craniotomy for tumor resection of meningioma PMH: tonsillectomy, pilonidal cystectomy, bursectomy, laparoscopic cholecystectomy, endoscopic gallstone removal, removal of distal tip of the index finger, laser reduction of pressure of both eyes    PT Comments    Pt. demonstrated increased R LE tone and R lateral weight shift, which decreased his ability to amb and with sit to stand. Pt requires skilled therapy in CIR to increase the use of his R UE and LE as well as decrease R LE tone and increase L LE weight bearing to increase balance, transfers and amb.  Follow Up Recommendations  CIR     Equipment Recommendations  Other (comment)(TBA)   Recommendations for Other Services       Precautions / Restrictions Precautions Precautions: Fall Required Braces or Orthoses: Other Brace/Splint Other Brace/Splint: Resting hand splint for R UE on order. Restrictions Weight Bearing Restrictions: No    Mobility  Bed Mobility                  Transfers Overall transfer level: Needs assistance Equipment used: 2 person hand held assist Transfers: Sit to/from Stand Sit to Stand: +2 safety/equipment;+2 physical assistance;Mod assist         General transfer comment: Had to prompt Pt. to place R LE underneath body when sit to stand and had to move R LE underneath Pt body.   Ambulation/Gait Ambulation/Gait assistance: Mod assist;+2 physical assistance;+2 safety/equipment Gait Distance (Feet): 40 Feet(2x) Assistive device: 1 person hand held assist;2 person hand held assist Gait Pattern/deviations: Step-through pattern;Step-to pattern;Decreased stride length;Decreased dorsiflexion - right;Drifts right/left;Narrow base of support Gait velocity: Decreased   General Gait Details: Pt walked 40 feet with +2 assist then  had to stop to sit in chair due to tone in R heel cord. Had to weight shift left to allow for R LE clearance. Pt walked with +1 assist with L UE holding railing. Pt. walked backwards with L UE on railing +1 assist with L weight shift cueing and assist for R LE placement.     Stairs             Wheelchair Mobility    Modified Rankin (Stroke Patients Only)       Balance Overall balance assessment: Needs assistance;Mild deficits observed, not formally tested Sitting-balance support: Single extremity supported;Feet supported Sitting balance-Leahy Scale: Fair   Postural control: Left lateral lean   Standing balance-Leahy Scale: Poor Standing balance comment: Pt required +1 assist to maintain standing balance.                             Cognition Arousal/Alertness: Awake/alert Behavior During Therapy: WFL for tasks assessed/performed Overall Cognitive Status: Within Functional Limits for tasks assessed                                 General Comments: Pt continues difficulty with word finding      Exercises Other Exercises Other Exercises: Pt. lateral weight shift to the right and left +1 assist while holding onto railing with bilateral UE. Other Exercises: Pt. amb forward +1 assist while holding onto railing with L UE. Other Exercises: Pt. amb backward +1 assist while holding on railing with L UE.    General Comments  Pertinent Vitals/Pain Pain Assessment: No/denies pain    Home Living                      Prior Function Level of Independence: Independent          PT Goals (current goals can now be found in the care plan section) Progress towards PT goals: Progressing toward goals    Frequency    Min 4X/week      PT Plan Current plan remains appropriate    Co-evaluation              AM-PAC PT "6 Clicks" Daily Activity  Outcome Measure  Difficulty turning over in bed (including adjusting bedclothes,  sheets and blankets)?: Unable Difficulty moving from lying on back to sitting on the side of the bed? : Unable Difficulty sitting down on and standing up from a chair with arms (e.g., wheelchair, bedside commode, etc,.)?: Unable Help needed moving to and from a bed to chair (including a wheelchair)?: A Lot Help needed walking in hospital room?: A Lot Help needed climbing 3-5 steps with a railing? : A Lot 6 Click Score: 9    End of Session Equipment Utilized During Treatment: Gait belt Activity Tolerance: Patient tolerated treatment well Patient left: in chair;with family/visitor present;with call bell/phone within reach   PT Visit Diagnosis: Other abnormalities of gait and mobility (R26.89);Other symptoms and signs involving the nervous system (R29.898);Hemiplegia and hemiparesis Hemiplegia - Right/Left: Right     Time: 1610-9604 PT Time Calculation (min) (ACUTE ONLY): 39 min  Charges:  $Gait Training: 8-22 mins $Therapeutic Activity: 8-22 mins $Neuromuscular Re-education: 8-22 mins                     [place], [place] [phone] 08/24/2018    Reginia Naas 08/24/2018, 1:33 PM

## 2018-08-24 NOTE — Progress Notes (Signed)
Paged Dr. Christella Noa regarding unreconciled discharge order for rehab. Awaiting call back.

## 2018-08-24 NOTE — PMR Pre-admission (Signed)
PMR Admission Coordinator Pre-Admission Assessment  Patient: James Olsen is an 72 y.o., male MRN: [id number] DOB: [birth year] Height: 5\' 9"  (175.3 cm) Weight: 90.4 kg              Insurance Information HMO:     PPO:      PCP:      IPA:      80/20:      OTHER:  PRIMARY: Medicare A & B      Policy#: [id number]      Subscriber: Self Pre-Cert#: Eligible       Employer: Retired Benefits:  Phone #: Verified online     Name: [employer]  Eff. 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  He was admitted on 08/18/2018 for cranial with tumor excision by Dr. Christella Noa.  Postop had weakness on right as well as expressive aphasia.  MRI brain done revealing left infarct with slight herniation left middle frontal gyrus through cranial defect and stable mass-effect on left superior frontal lobe.  Patient with resultant right-sided weakness with expressive aphasia affecting overall functional status.  CIR recommended for follow-up therapy and patient admitted 08/24/18.       Past Medical History  Past Medical History:   Diagnosis Date  . Arthritis   . GERD (gastroesophageal reflux disease)   . Glaucoma   . Macular degeneration   . Wears glasses     Family History  family history includes Congenital heart disease in his brother; Diabetes in his mother; Skin cancer in his brother.  Prior Rehab/Hospitalizations:  Has the patient had major surgery during 100 days prior to admission? No  Current Medications   Current Facility-Administered Medications:  .  0.9 %  sodium chloride infusion (Manually program via Guardrails IV Fluids), , Intravenous, Once, [name], MD .  calcium carbonate (TUMS - dosed in mg elemental calcium) chewable tablet 200 mg of elemental calcium, 1 tablet, Oral, Daily, Cabbell, Kyle, MD, 200 mg of elemental calcium at 08/24/18 0915 .  [COMPLETED] dexamethasone (DECADRON) tablet 6 mg, 6 mg, Oral, Q6H, 6 mg at 08/19/18 1213 **FOLLOWED BY** [COMPLETED] dexamethasone (DECADRON) tablet 4 mg, 4 mg, Oral, Q6H, 4 mg at 08/20/18 1136 **FOLLOWED BY** dexamethasone (DECADRON) tablet 2 mg, 2 mg, Oral, Q8H, Costella, Vincent J, PA-C, 2 mg at 08/24/18 1345 .  famotidine (PEPCID) tablet 20 mg, 20 mg, Oral, BID, [name], MD, 20 mg at 08/24/18 0915 .  heparin injection 5,000 Units, 5,000 Units, Subcutaneous, Q8H, [name], MD, 5,000 Units at 08/24/18 1345 .  HYDROcodone-acetaminophen (NORCO/VICODIN) 5-325 MG per tablet 1 tablet, 1 tablet, Oral, Q4H PRN, [name], MD .  labetalol (NORMODYNE,TRANDATE) injection 10-40 mg, 10-40 mg, Intravenous, Q10 min PRN, [name], MD .  levETIRAcetam (KEPPRA) tablet 500 mg, 500 mg, Oral, BID, Corinda Gubler, RPH, 500 mg at 08/24/18 0915 .  morphine 2 MG/ML injection 1-2 mg, 1-2 mg, Intravenous, Q2H PRN, [name], MD .  multivitamin (PROSIGHT) tablet 1 tablet, 1 tablet, Oral, BID, [name], MD, 1 tablet at 08/24/18 0915 .  naloxone [facility]) injection 0.08 mg, 0.08 mg, Intravenous, PRN, [name], MD .  ondansetron (ZOFRAN)  tablet 4 mg, 4 mg, Oral, Q4H PRN **OR** ondansetron (ZOFRAN) injection 4 mg, 4 mg, Intravenous, Q4H PRN, [name], MD .  promethazine (PHENERGAN) tablet 12.5-25 mg, 12.5-25 mg, Oral, Q4H PRN, [name], MD .  senna (SENOKOT) tablet 8.6 mg, 1 tablet, Oral, Daily PRN, [name], MD, 8.6 mg at 08/20/18 1136 .  simethicone (MYLICON) chewable tablet 80 mg, 80 mg, Oral, Q6H PRN, Costella, Vincent Lenna Sciara, PA-C, 80 mg at 08/23/18 1120  Patients Current Diet:  Diet Order            Diet regular Room service appropriate? Yes; Fluid consistency: Thin  Diet effective now              Precautions / Restrictions Precautions Precautions: Fall Other Brace/Splint: Resting hand splint for R UE on order. Restrictions Weight Bearing Restrictions: No   Has the patient had 2 or more falls or a fall with injury in the past year?No  Prior Activity Level Community (5-7x/wk): Prior to admission patient was fully independent.  He enjoys outdoor activities such as gardening, hiking, canoeing, and model rail roads.    Home Assistive Devices / Equipment Home Assistive Devices/Equipment: Eyeglasses Home Equipment: Shower seat - built in  Prior Device Use: Indicate devices/aids used by the patient prior to current illness, exacerbation or injury? None of the above  Prior Functional Level Prior Function Level of Independence: Independent  Self Care: Did the patient need help bathing, dressing, using the toilet or eating? Independent  Indoor Mobility: Did the patient need assistance with walking from room to room (with or without device)? Independent  Stairs: Did the patient need assistance with internal or external stairs (with or without device)? Independent  Functional Cognition: Did the patient need help planning regular tasks such as shopping or remembering to take medications? Independent  Current Functional Level Cognition  Overall Cognitive Status: Within Functional Limits for tasks  assessed Orientation Level: Oriented X4 General Comments: Pt continues difficulty with word finding    Extremity Assessment (includes Sensation/Coordination)  Upper Extremity Assessment: Defer to OT evaluation RUE Deficits / Details: no noted grasp, wrist, elbow, shoulder movement throughout session. no pain with ROM RUE Sensation: WNL RUE Coordination: decreased gross motor, decreased fine motor  Lower Extremity Assessment: RLE deficits/detail RLE Deficits / Details: increased tone noted with AAROM grossly 2/4 on Modified [place]; lifts leg anitgravity from hip and knee, ankle DF limited, but moves through partial range RLE Sensation: decreased light touch    ADLs  Overall ADL's : Needs assistance/impaired Eating/Feeding: Set up, Sitting Grooming: Wash/dry face, Set up, Sitting Grooming Details (indicate cue type and reason): in recliner Upper Body Bathing: Minimal assistance, Sitting Lower Body Bathing: Maximal assistance Upper Body Dressing : Minimal assistance Lower Body Dressing: Maximal assistance Toilet Transfer:  Moderate assistance, +2 for physical assistance, +2 for safety/equipment, Ambulation Toileting- Clothing Manipulation and Hygiene: Total assistance Functional mobility during ADLs: +2 for physical assistance, Moderate assistance, +2 for safety/equipment General ADL Comments: pt requires hand held (A) and mod cues for weight shift to the left to advance the R LE    Mobility  Overal bed mobility: Needs Assistance Bed Mobility: Rolling, Supine to Sit Rolling: Min guard Supine to sit: Mod assist General bed mobility comments: patient received in chair    Transfers  Overall transfer level: Needs assistance Equipment used: 2 person hand held assist Transfers: Sit to/from Stand Sit to Stand: +2 safety/equipment, +2 physical assistance, Mod assist General transfer comment: Had to prompt Pt. to place R LE underneath body when sit to stand and had to move R LE  underneath Pt body.     Ambulation / Gait / Stairs / Wheelchair Mobility  Ambulation/Gait Ambulation/Gait assistance: Mod assist, +2 physical assistance, +2 safety/equipment Gait Distance (Feet): 40 Feet(2x) Assistive device: 1 person hand held assist, 2 person hand held assist Gait Pattern/deviations: Step-through pattern, Step-to pattern, Decreased stride length, Decreased dorsiflexion - right, Drifts right/left, Narrow base of support General Gait Details: Pt walked 40 feet with +2 assist then had to stop to sit in chair due to tone in R heel cord. Had to weight shift left to allow for R LE clearance. Pt walked with +1 assist with L UE holding railing. Pt. walked backwards with L UE on railing +1 assist with L weight shift cueing and assist for R LE placement.   Gait velocity: Decreased Gait velocity interpretation: <1.8 ft/sec, indicate of risk for recurrent falls    Posture / Balance Balance Overall balance assessment: Needs assistance, Mild deficits observed, not formally tested Sitting-balance support: Single extremity supported, Feet supported Sitting balance-Leahy Scale: Fair Postural control: Left lateral lean Standing balance support: During functional activity, Bilateral upper extremity supported(via HHA) Standing balance-Leahy Scale: Poor Standing balance comment: Pt required +1 assist to maintain standing balance.     Special needs/care consideration BiPAP/CPAP: No CPM: No Continuous Drip IV: No Dialysis: No         Life Vest: No Oxygen: No Special Bed: No Trach Size: No Wound Vac (area): No       Skin: Dry, Monitoring surgical incision to top of head                          Bowel mgmt: Continent, last BM 08/23/18 Bladder mgmt: Continent  Diabetic mgmt: No     Previous Home Environment Living Arrangements: Spouse/significant other  Lives With: Spouse Available Help at Discharge: Family, Available 24 hours/day Type of Home: House Home Layout: Two level, Able to  live on main level with bedroom/bathroom Home Access: Stairs to enter Entrance Stairs-Rails: None Entrance Stairs-Number of Steps: 3 Bathroom Shower/Tub: Multimedia programmer: Standard Home Care Services: No  Discharge Living Setting Plans for Discharge Living Setting: Patient's home, Lives with (comment)(Spouse) Type of Home at Discharge: House Discharge Home Layout: Two level, Able to live on main level with bedroom/bathroom Discharge Home Access: Stairs to enter Entrance Stairs-Rails: None Entrance Stairs-Number of Steps: 3 Discharge Bathroom Shower/Tub: Walk-in shower, Other (comment)(built in bench ) Discharge Bathroom Toilet: Standard Does the patient have any problems obtaining your medications?: No  Social/Family/Support Systems Patient Roles: Spouse Contact Information: Spouse: Charlett Nose  Anticipated Caregiver: Spouse Anticipated Caregiver's Contact Information: [facility]:[phone] Ability/Limitations of Caregiver: None Caregiver Availability: 24/7 Discharge Plan  Discussed with Primary Caregiver: Yes Is Caregiver In Agreement with Plan?: Yes Does Caregiver/Family have Issues with Lodging/Transportation while Pt is in Rehab?: No  Goals/Additional Needs Patient/Family Goal for Rehab: PT/OT: Min A; SLP: Mod I  Expected length of stay: 15-19 days  Dietary Needs: Regular textures and thin liquids  Equipment Needs: TBD Pt/Family Agrees to Admission and willing to participate: Yes Program Orientation Provided & Reviewed with Pt/Caregiver Including Roles  & Responsibilities: Yes  Decrease burden of Care through IP rehab admission: No  Possible need for SNF placement upon discharge: No  Patient Condition: This patient's condition remains as documented in the consult dated 08/23/18, in which the Rehabilitation Physician determined and documented that the patient's condition is appropriate for intensive rehabilitative care in an inpatient rehabilitation facility. Will admit  to inpatient rehab today.  Preadmission Screen Completed By:  Gunnar Fusi, 08/24/2018 3:15 PM ______________________________________________________________________   Discussed status with Dr. Posey Pronto on 08/24/18 at 1220 and received telephone approval for admission today.  Admission Coordinator:  Gunnar Fusi, time 1220/Date 08/24/18

## 2018-08-24 NOTE — Progress Notes (Signed)
Spoke with Cabbell about unreconciled discharge order.  Patient's wife called and advised of transfer to 2U23.  2212: Varney Biles from [facility] called and patient report was given. Pt to be discharged and transferred to CIR. All belongings sent with patient.

## 2018-08-24 NOTE — Discharge Instructions (Signed)
Craniotomy °Care After °Please read the instructions outlined below and refer to this sheet in the next few weeks. These discharge instructions provide you with general information on caring for yourself after you leave the hospital. Your surgeon may also give you specific instructions. While your treatment has been planned according to the most current medical practices available, unavoidable complications occasionally occur. If you have any problems or questions after discharge, please call your surgeon. °Although there are many types of brain surgery, recovery following craniotomy (surgical opening of the skull) is much the same for each. However, recovery depends on many factors. These include the type and severity of brain injury and the type of surgery. It also depends on any nervous system function problems (neurological deficits) before surgery. If the craniotomy was done for cancer, chemotherapy and radiation could follow. You could be in the hospital from 5 days to a couple weeks. This depends on the type of surgery, findings, and whether there are complications. °HOME CARE INSTRUCTIONS  °· It is not unusual to hear a clicking noise after a craniotomy, the plates and screws used to attach the bone flap can sometimes cause this. It is a normal occurrence if this does happen °· Do not drive for 10 days after the operation °· Your scalp may feel spongy for a while, because of fluid under it. This will gradually get better. Occasionally, the surgeon will not replace the bone that was removed to access the brain. If there is a bony defect, the surgeon will ask you to wear a helmet for protection. This is a discussion you should have with your surgeon prior to leaving the hospital (discharge). °· Numbness may persist in some areas of your scalp. °· Take all medications as directed. Sometimes steroids to control swelling are prescribed. Anticonvulsants to prevent seizures may also be given. Do not use alcohol,  other drugs, or medications unless your surgeon says it is OK. °· Keep the wound dry and clean. The wound may be washed gently with soap and water. Then, you may gently blot or dab it dry, without rubbing. Do not take baths, use swimming pools or hot tubs for 10 days, or as instructed by your caregiver. It is best to wait to see you surgeon at your first postoperative visit, and to get directions at that time. °· Only take over-the-counter or prescription medicines for pain, discomfort, or fever as directed by your caregiver. °· You may continue your normal diet, as directed. °· Walking is OK for exercise. Wait at least 3 months before you return to mild, non-contact sports or as your surgeon suggests. Contact sports should be avoided for at least 1 year, unless your surgeon says it is OK. °· If you are prescribed steroids, take them exactly as prescribed. If you start having a decrease in nervous system functions (neurological deficits) and headaches as the dose of steroids is reduced, tell your surgeon right away. °· When the anticonvulsant prescription is finished you no longer need to take it. °SEEK IMMEDIATE MEDICAL CARE IF:  °· You develop nausea, vomiting, severe headaches, confusion, or you have a seizure. °· You develop chest pain, a stiff neck, or difficulty breathing. °· There is redness, swelling, or increasing pain in the wound or pin insertion sites. °· You have an increase in swelling or bruising around the eyes. °· There is drainage or pus coming from the wound. °· You have an oral temperature above 102° F (38.9° C), not controlled by medicine. °·   You notice a foul smell coming from the wound or dressing. °· The wound breaks open (edges not staying together) after the stitches have been removed. °· You develop dizziness or fainting while standing. °· You develop a rash. °· You develop any reaction or side effects to the medications given. °Document Released: 03/10/2006 Document Revised: 03/01/2012  Document Reviewed: 12/17/2009 °[employer]® Patient Information ©2013 [employer]. ° °

## 2018-08-24 NOTE — Progress Notes (Signed)
Paged Ortho tech again for resting hand splint. Per tech, resting hand splint is not a stocked item and must be received from outside vendor. Vendor closed due to holiday but pt should receive today. Will advise oncoming shift.

## 2018-08-24 NOTE — Progress Notes (Signed)
Nurse called for report. Pt will be transferred to 4W04 once discharge complete on 4NP unit.   Erie Noe, LPN

## 2018-08-25 ENCOUNTER — Other Ambulatory Visit: Payer: Self-pay

## 2018-08-25 ENCOUNTER — Inpatient Hospital Stay (HOSPITAL_COMMUNITY): Payer: Medicare Other

## 2018-08-25 ENCOUNTER — Encounter (HOSPITAL_COMMUNITY): Payer: Self-pay

## 2018-08-25 ENCOUNTER — Inpatient Hospital Stay (HOSPITAL_COMMUNITY)
Admission: RE | Admit: 2018-08-25 | Discharge: 2018-09-04 | DRG: 057 | Disposition: A | Discharge status: Home / Self Care | Payer: Medicare Other | Source: Intra-hospital | Attending: Physical Medicine & Rehabilitation | Admitting: Physical Medicine & Rehabilitation

## 2018-08-25 ENCOUNTER — Inpatient Hospital Stay (HOSPITAL_COMMUNITY): Payer: Medicare Other | Admitting: Speech Pathology

## 2018-08-25 DIAGNOSIS — I69351 Hemiplegia and hemiparesis following cerebral infarction affecting right dominant side: Principal | ICD-10-CM

## 2018-08-25 DIAGNOSIS — G8191 Hemiplegia, unspecified affecting right dominant side: Secondary | ICD-10-CM | POA: Diagnosis not present

## 2018-08-25 DIAGNOSIS — I639 Cerebral infarction, unspecified: Secondary | ICD-10-CM | POA: Diagnosis not present

## 2018-08-25 DIAGNOSIS — Z9049 Acquired absence of other specified parts of digestive tract: Secondary | ICD-10-CM | POA: Diagnosis not present

## 2018-08-25 DIAGNOSIS — Z973 Presence of spectacles and contact lenses: Secondary | ICD-10-CM | POA: Diagnosis not present

## 2018-08-25 DIAGNOSIS — D72828 Other elevated white blood cell count: Secondary | ICD-10-CM | POA: Diagnosis present

## 2018-08-25 DIAGNOSIS — D62 Acute posthemorrhagic anemia: Secondary | ICD-10-CM | POA: Diagnosis present

## 2018-08-25 DIAGNOSIS — H3530 Unspecified macular degeneration: Secondary | ICD-10-CM | POA: Diagnosis present

## 2018-08-25 DIAGNOSIS — E876 Hypokalemia: Secondary | ICD-10-CM | POA: Diagnosis present

## 2018-08-25 DIAGNOSIS — I69320 Aphasia following cerebral infarction: Secondary | ICD-10-CM

## 2018-08-25 DIAGNOSIS — Z298 Encounter for other specified prophylactic measures: Secondary | ICD-10-CM

## 2018-08-25 DIAGNOSIS — D320 Benign neoplasm of cerebral meninges: Secondary | ICD-10-CM | POA: Diagnosis not present

## 2018-08-25 DIAGNOSIS — Z79899 Other long term (current) drug therapy: Secondary | ICD-10-CM

## 2018-08-25 DIAGNOSIS — Z23 Encounter for immunization: Secondary | ICD-10-CM | POA: Diagnosis not present

## 2018-08-25 DIAGNOSIS — Z2989 Encounter for other specified prophylactic measures: Secondary | ICD-10-CM

## 2018-08-25 DIAGNOSIS — I6389 Other cerebral infarction: Secondary | ICD-10-CM | POA: Diagnosis not present

## 2018-08-25 DIAGNOSIS — K219 Gastro-esophageal reflux disease without esophagitis: Secondary | ICD-10-CM | POA: Diagnosis present

## 2018-08-25 DIAGNOSIS — H409 Unspecified glaucoma: Secondary | ICD-10-CM | POA: Diagnosis present

## 2018-08-25 DIAGNOSIS — D72829 Elevated white blood cell count, unspecified: Secondary | ICD-10-CM | POA: Diagnosis present

## 2018-08-25 DIAGNOSIS — D696 Thrombocytopenia, unspecified: Secondary | ICD-10-CM | POA: Diagnosis present

## 2018-08-25 DIAGNOSIS — I63522 Cerebral infarction due to unspecified occlusion or stenosis of left anterior cerebral artery: Secondary | ICD-10-CM | POA: Diagnosis not present

## 2018-08-25 LAB — CBC WITH DIFFERENTIAL/PLATELET
Abs Immature Granulocytes: 0.1 10*3/uL (ref 0.0–0.1)
Basophils Absolute: 0 10*3/uL (ref 0.0–0.1)
Basophils Relative: 0 %
Eosinophils Absolute: 0.1 10*3/uL (ref 0.0–0.7)
Eosinophils Relative: 1 %
HCT: 37 % — ABNORMAL LOW (ref 39.0–52.0)
Hemoglobin: 12.3 g/dL — ABNORMAL LOW (ref 13.0–17.0)
Immature Granulocytes: 1 %
Lymphocytes Relative: 6 %
Lymphs Abs: 0.7 10*3/uL (ref 0.7–4.0)
MCH: 29.9 pg (ref 26.0–34.0)
MCHC: 33.2 g/dL (ref 30.0–36.0)
MCV: 89.8 fL (ref 78.0–100.0)
Monocytes Absolute: 0.6 10*3/uL (ref 0.1–1.0)
Monocytes Relative: 5 %
Neutro Abs: 11.4 10*3/uL — ABNORMAL HIGH (ref 1.7–7.7)
Neutrophils Relative %: 87 %
Platelets: 205 10*3/uL (ref 150–400)
RBC: 4.12 MIL/uL — ABNORMAL LOW (ref 4.22–5.81)
RDW: 13.8 % (ref 11.5–15.5)
WBC: 13 10*3/uL — ABNORMAL HIGH (ref 4.0–10.5)

## 2018-08-25 LAB — COMPREHENSIVE METABOLIC PANEL
ALT: 45 U/L — ABNORMAL HIGH (ref 0–44)
AST: 22 U/L (ref 15–41)
Albumin: 2.9 g/dL — ABNORMAL LOW (ref 3.5–5.0)
Alkaline Phosphatase: 51 U/L (ref 38–126)
Anion gap: 8 (ref 5–15)
BUN: 20 mg/dL (ref 8–23)
CO2: 24 mmol/L (ref 22–32)
Calcium: 8.8 mg/dL — ABNORMAL LOW (ref 8.9–10.3)
Chloride: 103 mmol/L (ref 98–111)
Creatinine, Ser: 0.9 mg/dL (ref 0.61–1.24)
GFR calc Af Amer: >60 mL/min (ref >60)
GFR calc non Af Amer: >60 mL/min (ref >60)
Glucose, Bld: 182 mg/dL — ABNORMAL HIGH (ref 70–99)
Potassium: 3.4 mmol/L — ABNORMAL LOW (ref 3.5–5.1)
Sodium: 135 mmol/L (ref 135–145)
Total Bilirubin: 0.8 mg/dL (ref 0.3–1.2)
Total Protein: 5.5 g/dL — ABNORMAL LOW (ref 6.5–8.1)

## 2018-08-25 MED ORDER — PROCHLORPERAZINE EDISYLATE 10 MG/2ML IJ SOLN: 5.0000 mg | Freq: Four times a day (QID) | INTRAMUSCULAR | Status: DC | PRN

## 2018-08-25 MED ORDER — POLYETHYLENE GLYCOL 3350 17 G PO PACK
17.0000 g | PACK | Freq: Every day | ORAL | Status: DC | PRN
  Filled 2018-08-25: qty 1

## 2018-08-25 MED ORDER — PROCHLORPERAZINE 25 MG RE SUPP: 12.5000 mg | Freq: Four times a day (QID) | RECTAL | Status: DC | PRN

## 2018-08-25 MED ORDER — PANTOPRAZOLE SODIUM 40 MG PO TBEC
40.0000 mg | DELAYED_RELEASE_TABLET | Freq: Every day | ORAL | Status: DC
  Administered 2018-08-25 – 2018-09-04 (×11): 40 mg via ORAL
  Filled 2018-08-25 (×11): qty 1

## 2018-08-25 MED ORDER — DEXAMETHASONE 2 MG PO TABS
2.0000 mg | ORAL_TABLET | Freq: Three times a day (TID) | ORAL | Status: AC
  Administered 2018-08-25 – 2018-08-26 (×5): 2 mg via ORAL
  Filled 2018-08-25 (×5): qty 1

## 2018-08-25 MED ORDER — DEXAMETHASONE 2 MG PO TABS
2.0000 mg | ORAL_TABLET | Freq: Two times a day (BID) | ORAL | Status: AC
  Administered 2018-08-26 – 2018-08-28 (×4): 2 mg via ORAL
  Filled 2018-08-25 (×4): qty 1

## 2018-08-25 MED ORDER — LEVETIRACETAM 500 MG PO TABS
500.0000 mg | ORAL_TABLET | Freq: Two times a day (BID) | ORAL | Status: DC
  Administered 2018-08-25 – 2018-09-04 (×21): 500 mg via ORAL
  Filled 2018-08-25 (×21): qty 1

## 2018-08-25 MED ORDER — BISACODYL 10 MG RE SUPP: 10.0000 mg | Freq: Every day | RECTAL | Status: DC | PRN

## 2018-08-25 MED ORDER — METHOCARBAMOL 500 MG PO TABS: 500.0000 mg | ORAL_TABLET | Freq: Four times a day (QID) | ORAL | Status: DC | PRN

## 2018-08-25 MED ORDER — HEPARIN SODIUM (PORCINE) 5000 UNIT/ML IJ SOLN
5000.0000 [IU] | Freq: Three times a day (TID) | INTRAMUSCULAR | Status: DC
  Administered 2018-08-25 – 2018-09-04 (×29): 5000 [IU] via SUBCUTANEOUS
  Filled 2018-08-25 (×29): qty 1

## 2018-08-25 MED ORDER — FLEET ENEMA 7-19 GM/118ML RE ENEM: 1.0000 | ENEMA | Freq: Once | RECTAL | Status: DC | PRN

## 2018-08-25 MED ORDER — ALUM & MAG HYDROXIDE-SIMETH 200-200-20 MG/5ML PO SUSP
30.0000 mL | ORAL | Status: DC | PRN
  Administered 2018-08-25: 30 mL via ORAL
  Filled 2018-08-25: qty 30

## 2018-08-25 MED ORDER — PROCHLORPERAZINE MALEATE 5 MG PO TABS: 5.0000 mg | ORAL_TABLET | Freq: Four times a day (QID) | ORAL | Status: DC | PRN

## 2018-08-25 MED ORDER — HYDROCODONE-ACETAMINOPHEN 5-325 MG PO TABS
1.0000 | ORAL_TABLET | ORAL | Status: DC | PRN
  Administered 2018-09-04: 1 via ORAL
  Filled 2018-08-25: qty 1

## 2018-08-25 MED ORDER — PROMETHAZINE HCL 12.5 MG PO TABS: 12.5000 mg | ORAL_TABLET | ORAL | Status: DC | PRN

## 2018-08-25 MED ORDER — ACETAMINOPHEN 325 MG PO TABS: 325.0000 mg | ORAL_TABLET | ORAL | Status: DC | PRN

## 2018-08-25 MED ORDER — TRAZODONE HCL 50 MG PO TABS: 25.0000 mg | ORAL_TABLET | Freq: Every evening | ORAL | Status: DC | PRN

## 2018-08-25 MED ORDER — DIPHENHYDRAMINE HCL 12.5 MG/5ML PO ELIX: 12.5000 mg | ORAL_SOLUTION | Freq: Four times a day (QID) | ORAL | Status: DC | PRN

## 2018-08-25 MED ORDER — ONDANSETRON HCL 4 MG/2ML IJ SOLN: 4.0000 mg | INTRAMUSCULAR | Status: DC | PRN

## 2018-08-25 MED ORDER — DEXAMETHASONE 2 MG PO TABS
2.0000 mg | ORAL_TABLET | Freq: Every day | ORAL | Status: AC
  Administered 2018-08-29 – 2018-08-30 (×2): 2 mg via ORAL
  Filled 2018-08-25 (×2): qty 1

## 2018-08-25 MED ORDER — ONDANSETRON HCL 4 MG PO TABS: 4.0000 mg | ORAL_TABLET | ORAL | Status: DC | PRN

## 2018-08-25 MED ORDER — GUAIFENESIN-DM 100-10 MG/5ML PO SYRP: 5.0000 mL | ORAL_SOLUTION | Freq: Four times a day (QID) | ORAL | Status: DC | PRN

## 2018-08-25 MED ORDER — SIMETHICONE 80 MG PO CHEW: 80.0000 mg | CHEWABLE_TABLET | Freq: Four times a day (QID) | ORAL | Status: DC | PRN

## 2018-08-25 MED ORDER — PROSIGHT PO TABS
1.0000 | ORAL_TABLET | Freq: Every day | ORAL | Status: DC
  Administered 2018-08-25 – 2018-09-04 (×11): 1 via ORAL
  Filled 2018-08-25 (×11): qty 1

## 2018-08-25 NOTE — H&P (Addendum)
Physical Medicine and Rehabilitation Admission H&P    CC: Functional decline post left frontal meningioma excision  HPI: James Olsen is a 72 year old left-handed male with history of OA, GERD, macular degeneration, progressive numbness bilateral lower extremity since earlier this year who was found to have a mass on his head consistent with extracranial and intracranial left frontal meningioma.  History taken from chart review and patient. He was admitted on 08/18/2018 for cranial with tumor excision by Dr. Christella Noa.  Postop had weakness on right as well as expressive aphasia.  MRI brain reviewed, showing left frontal brain infarct. Per report, left infarct with slight herniation left middle frontal gyrus through cranial defect and stable mass-effect on left superior frontal lobe.  Started on decadron wean on 9/3. Patient with resultant right-sided weakness with expressive aphasia affecting overall functional status.  CIR recommended for follow-up therapy   Review of Systems  Constitutional: Negative for chills and fever.  HENT: Negative for hearing loss and tinnitus.   Eyes: Negative for blurred vision and double vision.  Respiratory: Negative for cough and shortness of breath.   Cardiovascular: Negative for chest pain and palpitations.  Gastrointestinal: Positive for heartburn. Negative for constipation and vomiting.  Genitourinary: Negative for dysuria and frequency.  Musculoskeletal: Negative for back pain and neck pain.       Right leg cramps  Skin: Negative for itching and rash.  Neurological: Positive for sensory change (Sensation improving BLE), speech change and focal weakness. Negative for dizziness and headaches.  Psychiatric/Behavioral: The patient has insomnia. The patient is not nervous/anxious.   All other systems reviewed and are negative.     Past Medical History:  Diagnosis Date  . Arthritis   . GERD (gastroesophageal reflux disease)   . Glaucoma   . Macular  degeneration   . Wears glasses     Past Surgical History:  Procedure Laterality Date  . CHOLECYSTECTOMY  2008   lap choli  . COLONOSCOPY    . CRANIOTOMY N/A 08/18/2018   Procedure: CRANIOTOMY TUMOR EXCISION;  Surgeon: [name], MD;  Location: [facility];  Service: Neurosurgery;  Laterality: N/A;  CRANIOTOMY TUMOR EXCISION  . NAILBED REPAIR Left 05/04/2014   Procedure: LEFT INDEX NAIL ABLATION V-Y FLAP COVERAGE;  Surgeon: [name]., MD;  Location: [facility];  Service: Orthopedics;  Laterality: Left;  index  . PILONIDAL CYST EXCISION     x2  . TONSILLECTOMY    . VASECTOMY      Family History  Problem Relation Age of Onset  . Diabetes Mother   . Skin cancer Brother   . Congenital heart disease Brother     Social History: Married.  Retired--used to work for [employer].  He reports he never smoke or use smokeless tobacco.  He drinks couple of beers a week as well as short of liquor once a month.  He reports he does not use illicit drugs.   Allergies: No Known Allergies    Medications Prior to Admission  Medication Sig Dispense Refill  . acetaminophen (TYLENOL) 500 MG tablet Take 500 mg by mouth daily as needed for moderate pain or headache.    . Multiple Vitamins-Minerals (PRESERVISION AREDS 2) CAPS Take 1 tablet by mouth 2 (two) times daily.    . Omega-3 Fatty Acids (FISH OIL) 1200 MG CAPS Take 1,200 mg by mouth every evening.    . ranitidine (ZANTAC) 150 MG tablet Take 150 mg by mouth daily as needed for heartburn.    Marland Kitchen  Red Yeast Rice 600 MG CAPS Take 1,200 mg by mouth daily.      Drug Regimen Review  Drug regimen was reviewed and remains appropriate with no significant issues identified  Home: Home Living Family/patient expects to be discharged to:: Private residence Living Arrangements: Spouse/significant other Available Help at Discharge: Family, Available 24 hours/day Type of Home: House Home Access: Stairs to enter Technical brewer of  Steps: 3 Entrance Stairs-Rails: None Home Layout: Two level, Able to live on main level with bedroom/bathroom Bathroom Shower/Tub: Multimedia programmer: Standard Home Equipment: Civil engineer, contracting - built in  Lives With: Spouse   Functional History: Prior Function Level of Independence: Independent  Functional Status:  Mobility: Bed Mobility Overal bed mobility: Needs Assistance Bed Mobility: Rolling, Supine to Sit Rolling: Min guard Supine to sit: Mod assist General bed mobility comments: patient received in chair Transfers Overall transfer level: Needs assistance Equipment used: 2 person hand held assist Transfers: Sit to/from Stand Sit to Stand: +2 safety/equipment, +2 physical assistance, Mod assist(Had to prompt Pt. to place R LE underneath body when STS) General transfer comment: Had to prompt Pt. to place R LE underneath body when STS and had to move R LE underneath Pt body.  Ambulation/Gait Ambulation/Gait assistance: Mod assist, +2 physical assistance, +2 safety/equipment Gait Distance (Feet): 40 Feet(2x) Assistive device: 1 person hand held assist, 2 person hand held assist Gait Pattern/deviations: Step-through pattern, Step-to pattern, Decreased stride length, Decreased dorsiflexion - right, Drifts right/left, Narrow base of support General Gait Details: Pt walked 40 feet with +2 assist then had to stop to sit in chair due to tone in R heel cord. Had to weight shift left to allow for R LE clearance. Pt walked with +1 assist with L UE holding railing. Pt. walked backwards with L UE on railing +1 assist with L weight shift cueing and assist for R LE placement.   Gait velocity: Decreased Gait velocity interpretation: <1.8 ft/sec, indicate of risk for recurrent falls    ADL: ADL Overall ADL's : Needs assistance/impaired Eating/Feeding: Set up, Sitting Grooming: Wash/dry face, Set up, Sitting Grooming Details (indicate cue type and reason): in recliner Upper Body  Bathing: Minimal assistance, Sitting Lower Body Bathing: Maximal assistance Upper Body Dressing : Minimal assistance Lower Body Dressing: Maximal assistance Toilet Transfer: Moderate assistance, +2 for physical assistance, +2 for safety/equipment, Ambulation Toileting- Clothing Manipulation and Hygiene: Total assistance Functional mobility during ADLs: +2 for physical assistance, Moderate assistance, +2 for safety/equipment General ADL Comments: pt requires hand held (A) and mod cues for weight shift to the left to advance the R LE  Cognition: Cognition Overall Cognitive Status: Within Functional Limits for tasks assessed Orientation Level: Oriented X4 Cognition Arousal/Alertness: Awake/alert Behavior During Therapy: WFL for tasks assessed/performed Overall Cognitive Status: Within Functional Limits for tasks assessed General Comments: Pt continues difficulty with word finding   Blood pressure 126/73, pulse (!) 121, temperature (!) 97.4 F (36.3 C), resp. rate 10, height 5\' 9"  (1.753 m), weight 90.4 kg, SpO2 98 %. Physical Exam  Nursing note and vitals reviewed. Constitutional: He is oriented to person, place, and time. He appears well-developed and well-nourished.  Pleasant and appropriate.   HENT:  Crani incision C/D/I with staples in place.   Eyes: EOM are normal. Right eye exhibits no discharge. Left eye exhibits no discharge.  Neck: Normal range of motion. Neck supple.  Cardiovascular: Normal rate and regular rhythm.  Respiratory: Effort normal and breath sounds normal.  GI: Soft. Bowel sounds are  normal.  Musculoskeletal:  No edema or tenderness in extremities  Neurological: He is alert and oriented to person, place, and time.  Expressive aphasia--able to self correct with Y/N questions.  He was able to follow simple motor commands without difficulty.  Verbal apraxia Motor:  RUE: 0/5 proximal to distal RLE: HF KE 3+/5, ADF 0/5 LUE/LLE: 5/5 proximal to distal Sensation  intact to light touch, except b/l feet Extensor tone RLE.    Skin:  See above    No results found for this or any previous visit (from the past 48 hour(s)). No results found.     Medical Problem List and Plan: 1.  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 2.  DVT Prophylaxis/Anticoagulation: Pharmaceutical: Heparin 3. Pain Management: Continue hydrocodone as needed 4. Mood: LCSW to follow for evaluation and support 5. Neuropsych: This patient is capable of making decisions on his own behalf. 6. Skin/Wound Care: Monitor incision for healing.  Routine pressure relief measures. 7. Fluids/Electrolytes/Nutrition: Monitor I's and O's.  Check lytes in a.m. 8.  GERD:  Severe symptoms at this time--will change Pepcid to Protonix. Should improve once steroids weaned off.  9.  Seizure prophylaxis: Keppra twice daily 10.  Leukocytosis: Likely reactive due to steroids.  Recheck in a.m. 11.  ABLA: Will recheck CBC in a.m. for stability 12.  Thrombocytopenia: Monitor for signs of bleeding.  Recheck in a.m for recovery versus further drop   Post Admission Physician Evaluation: 1. Preadmission assessment reviewed and changes made below. 2. Functional deficits secondary  to left frontal brain infarct. 3. Patient is admitted to receive collaborative, interdisciplinary care between the physiatrist, rehab nursing staff, and therapy team. 4. Patient's level of medical complexity and substantial therapy needs in context of that medical necessity cannot be provided at a lesser intensity of care such as a SNF. 5. Patient has experienced substantial functional loss from his/her baseline which was documented above under the "Functional History" and "Functional Status" headings.  Judging by the patient's diagnosis, physical exam, and functional history, the patient has potential for functional progress which will result in measurable gains while on inpatient rehab.  These  gains will be of substantial and practical use upon discharge  in facilitating mobility and self-care at the household level. 60. Physiatrist will provide 24 hour management of medical needs as well as oversight of the therapy plan/treatment and provide guidance as appropriate regarding the interaction of the two. 7. 24 hour rehab nursing will assist with bladder management, bowel management, safety, skin/wound care, disease management and patient education  and help integrate therapy concepts, techniques,education, etc. 8. PT will assess and treat for/with: Lower extremity strength, range of motion, stamina, balance, functional mobility, safety, adaptive techniques and equipment, wound care, coping skills, pain control, education. Goals are: Min A. 9. OT will assess and treat for/with: ADL's, functional mobility, safety, upper extremity strength, adaptive techniques and equipment, wound mgt, ego support, and community reintegration.   Goals are: Min A. Therapy may not proceed with showering this patient. 10. SLP will assess and treat for/with: speech, language.  Goals are: Supervision/Mod I. 11. Case Management and Social Worker will assess and treat for psychological issues and discharge planning. 12. Team conference will be held weekly to assess progress toward goals and to determine barriers to discharge. 13. Patient will receive at least 3 hours of therapy per day at least 5 days per week. 14. ELOS: 12-16 days.       15. Prognosis:  good  I have personally performed a face to face diagnostic evaluation, including, but not limited to relevant history and physical exam findings, of this patient and developed relevant assessment and plan.  Additionally, I have reviewed and concur with the physician assistant's documentation above.   The patient's status has not changed. The original post admission physician evaluation remains appropriate, and any changes from the pre-admission screening or  documentation from the acute chart are noted above.   Delice Lesch, MD, ABPMR James Leriche, PA-C 08/24/2018

## 2018-08-25 NOTE — Evaluation (Addendum)
Speech Language Pathology Assessment and Plan  Patient Details  Name: James Olsen MRN: [id number] Date of Birth: 1946-03-10  SLP Diagnosis: Aphasia  Rehab Potential: Excellent ELOS: 14-17 days     Today's Date: 08/25/2018 SLP Individual Time: 1330-1430 Time Calculation: 82 minues   Problem List:  Patient Active Problem List   Diagnosis Date Noted  . Stroke (cerebrum) ([facility]) 08/25/2018  . Acute cerebral infarction ([facility])   . Thrombocytopenia ([place])   . 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 Acute blood loss anemia   . Generalized OA   . Gastroesophageal reflux disease   . Seizure prophylaxis   . Leukocytosis   . Tumor 08/18/2018  . Meningioma determined by biopsy of brain ([place]) 08/18/2018  . MACULAR DEGENERATION 02/29/2008  . ALLERGY 02/29/2008  . Nonspecific (abnormal) findings on radiological and other examination of body structure 12/26/2007  . NONSPECIFIC ABNORM FIND RAD&OTH EXAM LUNG FIELD 12/26/2007  . GALLSTONES 10/01/2007  . ELEVATION, TRANSAMINASE/LDH LEVELS 09/06/2007  . HEPATITIS NOS 09/01/2007  . ABDOMINAL PAIN, EPIGASTRIC 09/01/2007   Past Medical History:  Past Medical History:  Diagnosis Date  . Arthritis   . GERD (gastroesophageal reflux disease)   . Glaucoma   . Macular degeneration   . Wears glasses    Past Surgical History:  Past Surgical History:  Procedure Laterality Date  . CHOLECYSTECTOMY  2008   lap choli  . COLONOSCOPY    . CRANIOTOMY N/A 08/18/2018   Procedure: CRANIOTOMY TUMOR EXCISION;  Surgeon: [name], MD;  Location: [facility];  Service: Neurosurgery;  Laterality: N/A;  CRANIOTOMY TUMOR EXCISION  . NAILBED REPAIR Left 05/04/2014   Procedure: LEFT INDEX NAIL ABLATION V-Y FLAP COVERAGE;  Surgeon: [name]., MD;  Location: [facility];  Service: Orthopedics;  Laterality: Left;  index  . PILONIDAL CYST EXCISION     x2  . TONSILLECTOMY    . VASECTOMY       Assessment / Plan / Recommendation Clinical Impression Patient is a 72 year old left-handed male with history of OA, GERD, macular degeneration, progressive numbness bilateral lower extremity since earlier this year who was found to have a mass on his head consistent with extracranial and intracranial left frontal meningioma.  History taken from chart review and patient. He was admitted on 08/18/2018 for cranial with tumor excision by Dr. Christella Noa.  Postop had weakness on right as well as expressive aphasia.  MRI brain reviewed, showing left frontal brain infarct. Per report, left infarct with slight herniation left middle frontal gyrus through cranial defect and stable mass-effect on left superior frontal lobe.  Started on decadron wean on 9/3. Patient with resultant right-sided weakness with expressive aphasia affecting overall functional status.  CIR recommended for follow-up therapy and patient admitted 08/24/18.   Patient demonstrates a mild aphasia characterized by decreased auditory comprehension of complex, abstract information requiring repetition. Patient also demonstrates mild, higher-level word-finding deficits at the conversation level with minimally decreased use of function words which appears to have improved significantly since initial evaluation on acute. Awareness of his verbal errors is a strength and patient requires extra time and supervision verbal cues to self-correct.  Patient's reading comprehension and written expression appeared [facility] for all tasks assessed. Patient would benefit from skilled SLP intervention to maximize his language function and overall functional independence prior to discharge.    Skilled Therapeutic Interventions          Administered a cognitive-linguistic evaluation, please  see above for details. Educated patient in regards to current language impairments and goals of skilled SLP intervention, he verbalized understanding and agreement.    SLP Assessment   Patient will need skilled [facility] during CIR admission    Recommendations  Oral Care Recommendations: Oral care BID Recommendations for Other Services: Neuropsych consult Patient destination: Home Follow up Recommendations: (TBD) Equipment Recommended: None recommended by SLP    SLP Frequency 3 to 5 out of 7 days   SLP Duration  SLP Intensity  SLP Treatment/Interventions 14-17 days   Minumum of 1-2 x/day, 30 to 90 minutes  Internal/external aids;Speech/Language facilitation;Therapeutic Activities;Patient/family education;Functional tasks;Cueing hierarchy    Pain No/Denies Pain    Function:   Cognition Comprehension Comprehension assist level: Understands complex 90% of the time/cues 10% of the time  Expression   Expression assist level: Expresses basic 50 - 74% of the time/requires cueing 25 - 49% of the time. Needs to repeat parts of sentences.  Social Interaction Social Interaction assist level: Interacts appropriately 90% of the time - Needs monitoring or encouragement for participation or interaction.  Problem Solving Problem solving assist level: Solves basic 25 - 49% of the time - needs direction more than half the time to initiate, plan or complete simple activities  Memory Memory assist level: More than reasonable amount of time   Short Term Goals: Week 1: SLP Short Term Goal 1 (Week 1): Patient will self-monitor and correct verbal errors with Mod I.  SLP Short Term Goal 2 (Week 1): Patient will utilize word-finding strategies at the conversation level with supervision verbal cues.  SLP Short Term Goal 3 (Week 1): Patient will utilize appropriate grammatical form with use of function words at the conversation level with supervision verbal cues.   Refer to Care Plan for Long Term Goals  Recommendations for other services: Neuropsych  Discharge Criteria: Patient will be discharged from SLP if patient refuses treatment 3 consecutive times  without medical reason, if treatment goals not met, if there is a change in medical status, if patient makes no progress towards goals or if patient is discharged from hospital.  The above assessment, treatment plan, treatment alternatives and goals were discussed and mutually agreed upon: by patient  Gautam Langhorst 08/25/2018, 2:36 PM

## 2018-08-25 NOTE — Progress Notes (Signed)
PHYSICAL MEDICINE & REHABILITATION     PROGRESS NOTE  Subjective/Complaints:  Pt seen laying in bed this AM.  She states he slept well over after getting to rehab late last night.  He needs to use the restroom.  ROS: Denies CP, SOB, N/V/D.  Objective: Vital Signs: Blood pressure 113/74, pulse 88, temperature 97.7 F (36.5 C), temperature source Oral, resp. rate 16, height 5\' 9"  (1.753 m), weight (!) 139.8 kg, SpO2 98 %. No results found. No results for input(s): WBC, HGB, HCT, PLT in the last 72 hours. No results for input(s): NA, K, CL, GLUCOSE, BUN, CREATININE, CALCIUM in the last 72 hours.  Invalid input(s): CO CBG (last 3)  No results for input(s): GLUCAP in the last 72 hours.  Wt Readings from Last 3 Encounters:  08/25/18 (!) 139.8 kg  08/18/18 90.4 kg  08/10/18 90.4 kg    Physical Exam:  BP 113/74   Pulse 88   Temp 97.7 F (36.5 C) (Oral)   Resp 16   Ht 5\' 9"  (1.753 m)   Wt (!) 139.8 kg   SpO2 98%   BMI 45.51 kg/m  Constitutional: He appears well-developed and well-nourished. Pleasant and appropriate.   HENT: Crani incision C/D/I   Eyes: EOM are normal. Right eye exhibits no discharge. Left eye exhibits no discharge.  Neck: Normal range of motion. Neck supple.  Cardiovascular: Normal rate and regular rhythm.  Respiratory: Effort normal and breath sounds normal.  GI: Soft. Bowel sounds are normal.  Musculoskeletal:  No edema or tenderness in extremities  Neurological: He is alert and oriented.  Expressive aphasia, improving He was able to follow simple motor commands without difficulty.  Verbal apraxia Motor:  RUE: 0/5 proximal to distal RLE: HF KE 4/5, ADF 0/5 LUE/LLE: 5/5 proximal to distal Extensor tone RLE.    Skin: See above   Assessment/Plan: 1. Functional deficits secondary to left frontal brain infarct which require 3+ hours per day of interdisciplinary therapy in a comprehensive inpatient rehab setting. Physiatrist is providing close  team supervision and 24 hour management of active medical problems listed below. Physiatrist and rehab team continue to assess barriers to discharge/monitor patient progress toward functional and medical goals.  Function:  Bathing Bathing position      Bathing parts      Bathing assist        Upper Body Dressing/Undressing Upper body dressing                    Upper body assist        Lower Body Dressing/Undressing Lower body dressing                                  Lower body assist        Toileting Toileting          Toileting assist     Transfers Chair/bed transfer Chair/bed transfer activity did not occur: Safety/medical concerns           Locomotion Ambulation           Wheelchair          Cognition Comprehension    Expression Expression assist level: Expresses complex ideas: With extra time/assistive device  Social Interaction Social Interaction assist level: Interacts appropriately with others with medication or extra time (anti-anxiety, antidepressant).  Problem Solving Problem solving assist level: Solves complex problems: With extra time  Memory  Memory assist level: More than reasonable amount of time    Medical Problem List and Plan: 1.  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  Begin CIR  Cont bracing 2.  DVT Prophylaxis/Anticoagulation: Pharmaceutical: Heparin 3. Pain Management: Continue hydrocodone as needed 4. Mood: LCSW to follow for evaluation and support 5. Neuropsych: This patient is capable of making decisions on his own behalf. 6. Skin/Wound Care: Monitor incision for healing.  Routine pressure relief measures. 7. Fluids/Electrolytes/Nutrition: Monitor I's and O's.    Labs pending 8.  GERD:  Changed Pepcid to Protonix.  9.  Seizure prophylaxis: Keppra twice daily 10.  Leukocytosis: Likely reactive due to steroids.    WBCs 11.7 on 8/29  Labs pending 11.  ABLA:    Hb 11.7 on 8/29  Labs pending 12.  Thrombocytopenia: Monitor for signs of bleeding.    Plts 104 on 8/29  Labs pending  LOS (Days) 0 A FACE TO FACE EVALUATION WAS PERFORMED  Linnae Rasool Lorie Phenix 08/25/2018 9:13 AM

## 2018-08-25 NOTE — Progress Notes (Signed)
Physical Medicine and Rehabilitation Consult   Reason for Consult: Functional deficits Referring Physician: Dr. Christella Noa    HPI: James Olsen is a 72 y.o. left handed male with history of OA, GERD, macular degeneration, progressive numbness BLE since early this year who was found to have a mass on his head consistent with extracranial and intracranial left frontal meningioma. History taken from chart review and patient. He was admitted on 08/18/18 for crani with tumor excision by Dr. Christella Noa. Post op with weakness on right and expressive aphasia.  MRI brain reviewed, showing left infarct. Per report, done revealing small focus of acute ischemia at inferior cavity margin extending to the ventricle, slight herniation left middle frontal gyrus through cranial defect, and stable edema mass-effect left superior frontal lobe.  Therapy evaluations done today revealing right-sided weakness with balance deficits affecting mobility, difficulty completing ADLs and mild expressive aphasia.  CIR recommended due to functional decline.  Review of Systems  Constitutional: Negative for chills and fever.  HENT: Negative for hearing loss and tinnitus.   Eyes: Negative for blurred vision and double vision.  Respiratory: Negative for cough and shortness of breath.   Cardiovascular: Negative for chest pain and palpitations.  Gastrointestinal: Negative for heartburn and nausea.  Genitourinary: Negative for dysuria and urgency.  Musculoskeletal: Negative for joint pain, myalgias and neck pain.  Skin: Negative for rash.  Neurological: Positive for sensory change (BLE numbness with activity since January  ), speech change and focal weakness. Negative for dizziness, seizures, loss of consciousness and headaches.  Psychiatric/Behavioral: The patient does not have insomnia.   All other systems reviewed and are negative.        Past Medical History:  Diagnosis Date  . Arthritis   . GERD (gastroesophageal  reflux disease)   . Glaucoma   . Macular degeneration   . Wears glasses          Past Surgical History:  Procedure Laterality Date  . CHOLECYSTECTOMY  2008   lap choli  . COLONOSCOPY    . CRANIOTOMY N/A 08/18/2018   Procedure: CRANIOTOMY TUMOR EXCISION;  Surgeon: [name], MD;  Location: [facility];  Service: Neurosurgery;  Laterality: N/A;  CRANIOTOMY TUMOR EXCISION  . NAILBED REPAIR Left 05/04/2014   Procedure: LEFT INDEX NAIL ABLATION V-Y FLAP COVERAGE;  Surgeon: [name]., MD;  Location: [facility];  Service: Orthopedics;  Laterality: Left;  index  . PILONIDAL CYST EXCISION     x2  . TONSILLECTOMY    . VASECTOMY           Family History  Problem Relation Age of Onset  . Diabetes Mother   . Skin cancer Brother   . Congenital heart disease Brother      Social History: Married. Retired --used to work for [employer].  reports that he has never smoked. He has never used smokeless tobacco. He reports that he drinks alcoho--beer couple of times a week and liquor once a month.  He reports that he does not use drugs.    Allergies: No Known Allergies          Medications Prior to Admission  Medication Sig Dispense Refill  . acetaminophen (TYLENOL) 500 MG tablet Take 500 mg by mouth daily as needed for moderate pain or headache.    . Multiple Vitamins-Minerals (PRESERVISION AREDS 2) CAPS Take 1 tablet by mouth 2 (two) times daily.    . Omega-3 Fatty Acids (FISH OIL) 1200 MG CAPS Take 1,200 mg by  mouth every evening.    . ranitidine (ZANTAC) 150 MG tablet Take 150 mg by mouth daily as needed for heartburn.    . Red Yeast Rice 600 MG CAPS Take 1,200 mg by mouth daily.      Home: Home Living Family/patient expects to be discharged to:: Private residence Living Arrangements: Spouse/significant other Available Help at Discharge: Family, Available 24 hours/day Type of Home: House Home Access: Stairs to enter Engineer, site of Steps: 3 Entrance Stairs-Rails: None Home Layout: Two level, Able to live on main level with bedroom/bathroom Bathroom Shower/Tub: Multimedia programmer: Standard Home Equipment: Civil engineer, contracting - built in  Lives With: Spouse  Functional History: Prior Function Level of Independence: Independent Functional Status:  Mobility: Bed Mobility Overal bed mobility: Needs Assistance Bed Mobility: Rolling, Supine to Sit Rolling: Min guard Supine to sit: Mod assist General bed mobility comments: pt able to advance to EOB but requires (A) to bring R LE to eob. pt needs (A) to advance R hip to EOBq Transfers Overall transfer level: Needs assistance Equipment used: 2 person hand held assist Transfers: Sit to/from Stand Sit to Stand: +2 physical assistance, Min assist, +2 safety/equipment General transfer comment: pt requires cues for positioning and righting to center.  Ambulation/Gait Ambulation/Gait assistance: Mod assist, +2 safety/equipment, +2 physical assistance Gait Distance (Feet): 70 Feet Assistive device: 2 person hand held assist Gait Pattern/deviations: Step-to pattern, Step-through pattern, Decreased stride length, Decreased dorsiflexion - right, Shuffle, Decreased weight shift to left General Gait Details: facilitation for weight shift, cues and assist for heel strike on R and for step length, cues and time for turns and maneuvering around obstacles in the room; assist for balance and L weight shift to allow for R swing   ADL: ADL Overall ADL's : Needs assistance/impaired Eating/Feeding: Set up, Sitting Grooming: Wash/dry face, Set up, Sitting Upper Body Bathing: Minimal assistance, Sitting Lower Body Bathing: Maximal assistance Upper Body Dressing : Minimal assistance Lower Body Dressing: Maximal assistance Toilet Transfer: +2 for physical assistance, +2 for safety/equipment, Minimal assistance, BSC, Stand-pivot Toileting- Clothing Manipulation and  Hygiene: Total assistance Functional mobility during ADLs: +2 for physical assistance, Moderate assistance, +2 for safety/equipment General ADL Comments: pt requires hand held (A) and mod cues for weight shift to the left to advance the R LE  Cognition: Cognition Overall Cognitive Status: Within Functional Limits for tasks assessed Orientation Level: Oriented X4 Cognition Arousal/Alertness: Awake/alert Behavior During Therapy: WFL for tasks assessed/performed Overall Cognitive Status: Within Functional Limits for tasks assessed General Comments: Pt with delayed response due to expressive deficits   Blood pressure (!) 111/93, pulse 67, temperature 98.1 F (36.7 C), temperature source Oral, resp. rate 12, height 5\' 9"  (1.753 m), weight 90.4 kg, SpO2 98 %. Physical Exam  Nursing note and vitals reviewed. Constitutional: He is oriented to person, place, and time. He appears well-developed and well-nourished.  HENT:  Scalp incision  Eyes: Pupils are equal, round, and reactive to light. EOM are normal. Right eye exhibits no discharge. Left eye exhibits no discharge.  Neck: Normal range of motion. Neck supple.  Cardiovascular: Normal rate and regular rhythm.  Respiratory: Effort normal and breath sounds normal.  GI: Soft. Bowel sounds are normal.  Musculoskeletal: He exhibits no tenderness.  1+ edema right hand  Neurological: He is alert and oriented to person, place, and time.  Speech clear but occasional word finding deficits.  Verbal apraxia Motor: RUE: 0/5 proximal to distal RLE: HF KE 3+/5, ADF 0/5 LUE/LLE: 5/5  proximal to distal Sensation intact to light touch, except b/l feet Extensor tone RLE.   Skin:  Scalp incision c/d/i  Psychiatric: His speech is delayed. He is slowed.  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  Records reviewed and summated above.  1. Does the need for close,  24 hr/day medical supervision in concert with the patient's rehab needs make it unreasonable for this patient to be served in a less intensive setting? Yes  2. Co-Morbidities requiring supervision/potential complications: OA (ensure pain does not limit therapies), GERD, macular degeneration, progressive numbness BLE, seizure prophylaxis (cont meds), leukocytosis (cont to monitor for signs and symptoms of infection, further workup if indicated), ABLA (transfuse if necessary to ensure appropriate perfusion for increased activity tolerance) 3. Due to safety, skin/wound care, disease management, pain management and patient education, does the patient require 24 hr/day rehab nursing? Yes 4. Does the patient require coordinated care of a physician, rehab nurse, PT (1-2 hrs/day, 5 days/week), OT (1-2 hrs/day, 5 days/week) and SLP (1-2 hrs/day, 5 days/week) to address physical and functional deficits in the context of the above medical diagnosis(es)? Yes Addressing deficits in the following areas: balance, endurance, locomotion, strength, transferring, bathing, dressing, toileting, cognition, language and psychosocial support 5. Can the patient actively participate in an intensive therapy program of at least 3 hrs of therapy per day at least 5 days per week? Yes 6. The potential for patient to make measurable gains while on inpatient rehab is excellent 7. Anticipated functional outcomes upon discharge from inpatient rehab are min assist  with PT, min assist with OT, modified independent with SLP. 8. Estimated rehab length of stay to reach the above functional goals is: 15-19 days. 9. Anticipated D/C setting: Home 10. Anticipated post D/C treatments: HH therapy and Home excercise program 11. Overall Rehab/Functional Prognosis: good  RECOMMENDATIONS: This patient's condition is appropriate for continued rehabilitative care in the following setting: CIR Patient has agreed to participate in recommended program.  Yes Note that insurance prior authorization may be required for reimbursement for recommended care.  Comment: Rehab Admissions Coordinator to follow up.   I have personally performed a face to face diagnostic evaluation, including, but not limited to relevant history and physical exam findings, of this patient and developed relevant assessment and plan.  Additionally, I have reviewed and concur with the physician assistant's documentation above.   Delice Lesch, MD, ABPMR Bary Leriche, PA-C 08/23/2018    Revision History                        Routing History

## 2018-08-25 NOTE — Progress Notes (Signed)
Patient ID: James Olsen, male   DOB: April 20, 1946, 72 y.o.   MRN: [id number] Patient arrived on the rehab unit at [facility] via bed with nurse & nurse tech from [phone]. He was alert & responsive, no c/o pain or discomfort. He did not bring any belongings other than his glasses, stated that his wife took them home & would bring them later. He has expressive aphasia, with delayed responses & difficulty with word selection. He had an IV to the left hand that had air in it, so it was removed. No other devices were present. He was oriented to the floor & rehab process. Call bell is within reach, bed alarm is on & safety plan & interventions were explained . He verbalized understanding. Will continue to monitor.

## 2018-08-25 NOTE — Progress Notes (Signed)
PMR Admission Coordinator Pre-Admission Assessment  Patient: James Olsen is an 72 y.o., male MRN: [id number] DOB: Mar 07, 1946 Height: 5\' 9"  (175.3 cm) Weight: 90.4 kg                                                                                                                                                  Insurance Information HMO:     PPO:      PCP:      IPA:      80/20:      OTHER:  PRIMARY: Medicare A & B      Policy#: [id number]      Subscriber: Self Pre-Cert#: Eligible       Employer: Retired Benefits:  Phone #: Verified online     Name: [employer]  Eff. Date: 09/21/12     Deduct: $1364      Out of Pocket Max: N/A      Life Max: N/A CIR: 100%      SNF: 100% days 1-20; 80% days 21-100 Outpatient: 80%     Co-Pay: 20% Home Health: 100%      Co-Pay: $0 DME: 80%     Co-Pay: 20% Providers: Patient's Choice   SECONDARY: AARP      Policy#: [id number]      Subscriber: Self Employer: Retired     Benefits:  Phone #: [phone]       Emergency [place]    Name [place] Spouse [phone]  [phone]     Current Medical History  Patient Admitting Diagnosis: Left frontal meningiomawith subsequent left cva  History of Present Illness: James Olsen is a 72 year old left-handed male with history of OA, GERD, macular degeneration, progressive numbness bilateral lower extremity since earlier this year who was found to have a mass on his head consistent with extracranial and intracranial left frontal meningioma. He was admitted on 08/18/2018 for cranial with tumor excision by Dr. Christella Olsen. Postop had weakness on right as well as expressive aphasia. MRI brain done revealing left infarct with slight herniation left middle frontal gyrus through cranial defect and stable mass-effect on left superior frontal lobe. Patient with resultant right-sided weakness with expressive aphasia affecting  overall functional status. CIR recommended for follow-up therapy and patient admitted 08/24/18.   Past Medical History      Past Medical History:  Diagnosis Date  . Arthritis   . GERD (gastroesophageal reflux disease)   . Glaucoma   . Macular degeneration   . Wears glasses     Family History  family history includes Congenital heart disease in his brother; Diabetes in his mother; Skin cancer in his brother.  Prior Rehab/Hospitalizations:  Has the patient had major surgery during 100 days prior to admission? No  Current Medications  Current Facility-Administered Medications:  .  0.9 %  sodium chloride infusion (Manually program via Guardrails IV Fluids), , Intravenous, Once, James Pall, MD .  calcium carbonate (TUMS - dosed in mg elemental calcium) chewable tablet 200 mg of elemental calcium, 1 tablet, Oral, Daily, Olsen, Kyle, MD, 200 mg of elemental calcium at 08/24/18 0915 .  [COMPLETED] dexamethasone (DECADRON) tablet 6 mg, 6 mg, Oral, Q6H, 6 mg at 08/19/18 1213 **FOLLOWED BY** [COMPLETED] dexamethasone (DECADRON) tablet 4 mg, 4 mg, Oral, Q6H, 4 mg at 08/20/18 1136 **FOLLOWED BY** dexamethasone (DECADRON) tablet 2 mg, 2 mg, Oral, Q8H, Olsen, James J, PA-C, 2 mg at 08/24/18 1345 .  famotidine (PEPCID) tablet 20 mg, 20 mg, Oral, BID, James Pall, MD, 20 mg at 08/24/18 0915 .  heparin injection 5,000 Units, 5,000 Units, Subcutaneous, Q8H, James Pall, MD, 5,000 Units at 08/24/18 1345 .  HYDROcodone-acetaminophen (NORCO/VICODIN) 5-325 MG per tablet 1 tablet, 1 tablet, Oral, Q4H PRN, James Pall, MD .  labetalol (NORMODYNE,TRANDATE) injection 10-40 mg, 10-40 mg, Intravenous, Q10 min PRN, James Pall, MD .  levETIRAcetam (KEPPRA) tablet 500 mg, 500 mg, Oral, BID, James Olsen, James Olsen, 500 mg at 08/24/18 0915 .  morphine 2 MG/ML injection 1-2 mg, 1-2 mg, Intravenous, Q2H PRN, James Pall, MD .  multivitamin (PROSIGHT) tablet 1 tablet, 1 tablet, Oral, BID,  James Pall, MD, 1 tablet at 08/24/18 0915 .  naloxone [facility]) injection 0.08 mg, 0.08 mg, Intravenous, PRN, James Pall, MD .  ondansetron (ZOFRAN) tablet 4 mg, 4 mg, Oral, Q4H PRN **OR** ondansetron (ZOFRAN) injection 4 mg, 4 mg, Intravenous, Q4H PRN, James Pall, MD .  promethazine (PHENERGAN) tablet 12.5-25 mg, 12.5-25 mg, Oral, Q4H PRN, James Pall, MD .  senna (SENOKOT) tablet 8.6 mg, 1 tablet, Oral, Daily PRN, James Pall, MD, 8.6 mg at 08/20/18 1136 .  simethicone (MYLICON) chewable tablet 80 mg, 80 mg, Oral, Q6H PRN, Olsen, James Lenna Sciara, PA-C, 80 mg at 08/23/18 1120  Patients Current Diet:     Diet Order                  Diet regular Room service appropriate? Yes; Fluid consistency: Thin  Diet effective now               Precautions / Restrictions Precautions Precautions: Fall Other Brace/Splint: Resting hand splint for R UE on order. Restrictions Weight Bearing Restrictions: No   Has the patient had 2 or more falls or a fall with injury in the past year?No  Prior Activity Level Community (5-7x/wk): Prior to admission patient was fully independent.  He enjoys outdoor activities such as gardening, hiking, canoeing, and model rail roads.    Home Assistive Devices / Equipment Home Assistive Devices/Equipment: Eyeglasses Home Equipment: Shower seat - built in  Prior Device Use: Indicate devices/aids used by the patient prior to current illness, exacerbation or injury? None of the above  Prior Functional Level Prior Function Level of Independence: Independent  Self Care: Did the patient need help bathing, dressing, using the toilet or eating? Independent  Indoor Mobility: Did the patient need assistance with walking from room to room (with or without device)? Independent  Stairs: Did the patient need assistance with internal or external stairs (with or without device)? Independent  Functional Cognition: Did the patient need help planning  regular tasks such as shopping or remembering to take medications? Independent  Current Functional Level Cognition  Overall Cognitive Status: Within Functional Limits for tasks assessed Orientation Level: Oriented X4 General Comments:  Pt continues difficulty with word finding    Extremity Assessment (includes Sensation/Coordination)  Upper Extremity Assessment: Defer to OT evaluation RUE Deficits / Details: no noted grasp, wrist, elbow, shoulder movement throughout session. no pain with ROM RUE Sensation: WNL RUE Coordination: decreased gross motor, decreased fine motor  Lower Extremity Assessment: RLE deficits/detail RLE Deficits / Details: increased tone noted with AAROM grossly 2/4 on Modified [place]; lifts leg anitgravity from hip and knee, ankle DF limited, but moves through partial range RLE Sensation: decreased light touch    ADLs  Overall ADL's : Needs assistance/impaired Eating/Feeding: Set up, Sitting Grooming: Wash/dry face, Set up, Sitting Grooming Details (indicate cue type and reason): in recliner Upper Body Bathing: Minimal assistance, Sitting Lower Body Bathing: Maximal assistance Upper Body Dressing : Minimal assistance Lower Body Dressing: Maximal assistance Toilet Transfer: Moderate assistance, +2 for physical assistance, +2 for safety/equipment, Ambulation Toileting- Clothing Manipulation and Hygiene: Total assistance Functional mobility during ADLs: +2 for physical assistance, Moderate assistance, +2 for safety/equipment General ADL Comments: pt requires hand held (A) and mod cues for weight shift to the left to advance the R LE    Mobility  Overal bed mobility: Needs Assistance Bed Mobility: Rolling, Supine to Sit Rolling: Min guard Supine to sit: Mod assist General bed mobility comments: patient received in chair    Transfers  Overall transfer level: Needs assistance Equipment used: 2 person hand held assist Transfers: Sit to/from Stand Sit  to Stand: +2 safety/equipment, +2 physical assistance, Mod assist General transfer comment: Had to prompt Pt. to place R LE underneath body when sit to stand and had to move R LE underneath Pt body.     Ambulation / Gait / Stairs / Wheelchair Mobility  Ambulation/Gait Ambulation/Gait assistance: Mod assist, +2 physical assistance, +2 safety/equipment Gait Distance (Feet): 40 Feet(2x) Assistive device: 1 person hand held assist, 2 person hand held assist Gait Pattern/deviations: Step-through pattern, Step-to pattern, Decreased stride length, Decreased dorsiflexion - right, Drifts right/left, Narrow base of support General Gait Details: Pt walked 40 feet with +2 assist then had to stop to sit in chair due to tone in R heel cord. Had to weight shift left to allow for R LE clearance. Pt walked with +1 assist with L UE holding railing. Pt. walked backwards with L UE on railing +1 assist with L weight shift cueing and assist for R LE placement.   Gait velocity: Decreased Gait velocity interpretation: <1.8 ft/sec, indicate of risk for recurrent falls    Posture / Balance Balance Overall balance assessment: Needs assistance, Mild deficits observed, not formally tested Sitting-balance support: Single extremity supported, Feet supported Sitting balance-Leahy Scale: Fair Postural control: Left lateral lean Standing balance support: During functional activity, Bilateral upper extremity supported(via HHA) Standing balance-Leahy Scale: Poor Standing balance comment: Pt required +1 assist to maintain standing balance.     Special needs/care consideration BiPAP/CPAP: No CPM: No Continuous Drip IV: No Dialysis: No         Life Vest: No Oxygen: No Special Bed: No Trach Size: No Wound Vac (area): No       Skin: Dry, Monitoring surgical incision to top of head                          Bowel mgmt: Continent, last BM 08/23/18 Bladder mgmt: Continent  Diabetic mgmt: No     Previous Home  Environment Living Arrangements: Spouse/significant other  Lives With: Spouse Available Help at Discharge: Family,  Available 24 hours/day Type of Home: House Home Layout: Two level, Able to live on main level with bedroom/bathroom Home Access: Stairs to enter Entrance Stairs-Rails: None Entrance Stairs-Number of Steps: 3 Bathroom Shower/Tub: Multimedia programmer: Standard Home Care Services: No  Discharge Living Setting Plans for Discharge Living Setting: Patient's home, Lives with (comment)(Spouse) Type of Home at Discharge: House Discharge Home Layout: Two level, Able to live on main level with bedroom/bathroom Discharge Home Access: Stairs to enter Entrance Stairs-Rails: None Entrance Stairs-Number of Steps: 3 Discharge Bathroom Shower/Tub: Walk-in shower, Other (comment)(built in bench ) Discharge Bathroom Toilet: Standard Does the patient have any problems obtaining your medications?: No  Social/Family/Support Systems Patient Roles: Spouse Contact Information: Spouse: Theme park manager  Anticipated Caregiver: Spouse Anticipated Caregiver's Contact Information: cELL:[phone] Ability/Limitations of Caregiver: None Caregiver Availability: 24/7 Discharge Plan Discussed with Primary Caregiver: Yes Is Caregiver In Agreement with Plan?: Yes Does Caregiver/Family have Issues with Lodging/Transportation while Pt is in Rehab?: No  Goals/Additional Needs Patient/Family Goal for Rehab: PT/OT: Min A; SLP: Mod I  Expected length of stay: 15-19 days  Dietary Needs: Regular textures and thin liquids  Equipment Needs: TBD Pt/Family Agrees to Admission and willing to participate: Yes Program Orientation Provided & Reviewed with Pt/Caregiver Including Roles  & Responsibilities: Yes  Decrease burden of Care through IP rehab admission: No  Possible need for SNF placement upon discharge: No  Patient Condition: This patient's condition remains as documented in the consult dated  08/23/18, in which the Rehabilitation Physician determined and documented that the patient's condition is appropriate for intensive rehabilitative care in an inpatient rehabilitation facility. Will admit to inpatient rehab today.  Preadmission Screen Completed By:  Gunnar Fusi, 08/24/2018 3:15 PM ______________________________________________________________________   Discussed status with Dr. Posey Pronto on 08/24/18 at 1220 and received telephone approval for admission today.  Admission Coordinator:  Gunnar Fusi, time 1220/Date 08/24/18           Cosigned by: Jamse Arn, MD at 08/24/2018 3:21 PM  Revision History

## 2018-08-25 NOTE — Progress Notes (Signed)
Introduced self to pt, pt desired info on mental health AD.  This chaplain resident was not yet briefed about the mental health form, so consulted with 2 unit RNs and they referred to unit CSW who was unavailable (msg was left by RN on VM).  Chaplain also referring to administrative level chaplain staff for further info.  Updated pt on status of his request.  Myra Gianotti resident, [phone]

## 2018-08-25 NOTE — Evaluation (Signed)
Physical Therapy Assessment and Plan  Patient Details  Name: James Olsen MRN: [id number] Date of Birth: [birth year]  PT Diagnosis: Abnormal posture, Abnormality of gait, Cognitive deficits, Edema, Hemiplegia dominant, Hypertonia and Impaired sensation Rehab Potential: Good ELOS: 14-17   Today's Date: 08/25/2018 PT Individual Time: 4481-8563 PT Individual Time Calculation (min): 75 min    Problem List:  Patient Active Problem List   Diagnosis Date Noted  . 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 Acute cerebral infarction ([place])   . Thrombocytopenia ([place])   . 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 Acute blood loss anemia   . Generalized OA   . Gastroesophageal reflux disease   . Seizure prophylaxis   . Leukocytosis   . Tumor 08/18/2018  . 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 MACULAR DEGENERATION 02/29/2008  . ALLERGY 02/29/2008  . Nonspecific (abnormal) findings on radiological and other examination of body structure 12/26/2007  . NONSPECIFIC ABNORM FIND RAD&OTH EXAM LUNG FIELD 12/26/2007  . GALLSTONES 10/01/2007  . ELEVATION, TRANSAMINASE/LDH LEVELS 09/06/2007  . HEPATITIS NOS 09/01/2007  . ABDOMINAL PAIN, EPIGASTRIC 09/01/2007    Past Medical History:  Past Medical History:  Diagnosis Date  . Arthritis   . GERD (gastroesophageal reflux disease)   . Glaucoma   . Macular degeneration   . Wears glasses    Past Surgical History:  Past Surgical History:  Procedure Laterality Date  . CHOLECYSTECTOMY  2008   lap choli  . COLONOSCOPY    . CRANIOTOMY N/A 08/18/2018   Procedure: CRANIOTOMY TUMOR EXCISION;  Surgeon: [name], MD;  Location: [facility];  Service: Neurosurgery;  Laterality: N/A;  CRANIOTOMY TUMOR EXCISION  . NAILBED REPAIR Left 05/04/2014   Procedure: LEFT INDEX NAIL ABLATION V-Y FLAP COVERAGE;  Surgeon: [name]., MD;  Location: [facility];  Service: Orthopedics;   Laterality: Left;  index  . PILONIDAL CYST EXCISION     x2  . TONSILLECTOMY    . VASECTOMY      Assessment & Plan Clinical Impression: [name] is a 72 year old left-handed male with history of OA, GERD, macular degeneration, progressive numbness bilateral lower extremity since earlier this year who was found to have a mass on his head consistent with extracranial and intracranial left frontal meningioma.  History taken from chart review and patient. He was admitted on 08/18/2018 for cranial with tumor excision by Dr. Christella Noa.  Postop had weakness on right as well as expressive aphasia.  MRI brain reviewed, showing left frontal brain infarct. Per report, left infarct with slight herniation left middle frontal gyrus through cranial defect and stable mass-effect on left superior frontal lobe.  Started on decadron wean on 9/3. Patient with resultant right-sided weakness with expressive aphasia affecting overall functional status.   Patient transferred to CIR on 08/25/2018 .   Patient currently requires total with mobility secondary to muscle joint tightness, decreased cardiorespiratoy endurance, impaired timing and sequencing, abnormal tone, unbalanced muscle activation, motor apraxia and decreased motor planning, decreased attention, decreased awareness, decreased problem solving and decreased safety awareness and decreased sitting balance, decreased standing balance, decreased postural control, hemiplegia and decreased balance strategies.  Prior to hospitalization, patient was independent  with mobility and lived with Spouse in a House home.  Home access is 2Stairs to enter.  Patient will benefit from skilled PT intervention to maximize safe functional mobility, minimize fall risk and decrease caregiver burden for planned discharge home  with 24 hour supervision.  Anticipate patient will benefit from follow up [place] at discharge.  PT - End of Session Activity Tolerance: Tolerates 10 - 20 min  activity with multiple rests Endurance Deficit: Yes Endurance Deficit Description: fatigued after gait x 30' PT Assessment Rehab Potential (ACUTE/IP ONLY): Good PT Patient demonstrates impairments in the following area(s): Balance;Edema;Endurance;Motor;Safety;Sensory PT Transfers Functional Problem(s): Bed Mobility;Bed to Chair;Car;Furniture PT Locomotion Functional Problem(s): Ambulation;Wheelchair Mobility;Stairs PT Plan PT Intensity: Minimum of 1-2 x/day ,45 to 90 minutes PT Frequency: 5 out of 7 days PT Duration Estimated Length of Stay: 14-17 PT Treatment/Interventions: Ambulation/gait training;Community reintegration;DME/adaptive equipment instruction;Neuromuscular re-education;Psychosocial support;Stair training;UE/LE Strength taining/ROM;Wheelchair propulsion/positioning;Balance/vestibular training;Discharge planning;Functional electrical stimulation;Pain management;Therapeutic Activities;UE/LE Coordination activities;Cognitive remediation/compensation;Functional mobility training;Patient/family education;Splinting/orthotics;Therapeutic Exercise PT Transfers Anticipated Outcome(s): supervision basic and car PT Locomotion Anticipated Outcome(s): supervision:  w/c x 150' , gait x 150' with LRAD, up/down 12 steps 1 rail with supervsion, min assist : 2 deep steps with LRAD without rails for home entry;  PT Recommendation Recommendations for Other Services: Therapeutic Recreation consult Therapeutic Recreation Interventions: Outing/community reintergration Follow Up Recommendations: Home health PT;24 hour supervision/assistance Patient destination: Home Equipment Recommended: To be determined  Skilled Therapeutic Intervention  Pt participated well in eval.  He attempted to be conversational, but was limited by frequent word substitutions and word finding problems.  Pt spontaneously attempted to propel w/c using bil feet; once PT placed R foot back on foot plate, he propelled with L foot  only, never problem-solving that he would benefit from using his LUE as well.  Once cued to use hemi method of w/c propulsion, min assist for steering x 150'.  Pt demonstrated good carry-over throughout session for sequencing components of setting up w/c for transfers.  In supine on firm mat, neuro re-ed LLE via multimodal cues for bil bridging x 10, lower trunk rotation x 20.  On 3" high steps, 2 persons required to assist him descend backwards, due to difficulty relaxing RLE in order for PT to pick it up and place on next step.  Pt needed cues throughout session to slow down when moving; pt did not appear impulsive, but was attempting to move at the same speeds that he did before surgery/CVA.  Pt left resting in w/c with wife present.  PT discussed falls safety with pt and wife, use of call bell and need to call staff if he wanted to change into regular clothes, which wife had just brought.  Pt and wife verbalized understanding.    PT Evaluation Precautions/Restrictions Precautions Precautions: Fall Required Braces or Orthoses: Other Brace/Splint Other Brace/Splint: Resting hand splint for R UE Restrictions Weight Bearing Restrictions: No General   Vital Signs  Pain Pain Assessment Pain Score: 0-No pain Home Living/Prior Functioning Home Living Available Help at Discharge: Family;Available 24 hours/day Type of Home: House Home Access: Stairs to enter [employer] of Steps: 2 Entrance Stairs-Rails: None Home Layout: Two level;Able to live on main level with bedroom/bathroom Bathroom Shower/Tub: Walk-in shower;Tub/shower unit(built in seat in shower) Bathroom Toilet: Standard Bathroom Accessibility: Yes  Lives With: Spouse Prior Function Level of Independence: Independent with basic ADLs;Independent with gait  Able to Take Stairs?: Yes Driving: Yes Vocation: Retired Leisure: Hobbies-yes (Comment) Comments: gardener Vision/Perception - wears glasses; no change per pt     Cognition Overall Cognitive Status: Within Functional Limits for tasks assessed Arousal/Alertness: Awake/alert Orientation Level: Oriented X4 Attention: Selective Selective Attention: Appears intact  Safety: cues needed to slow down throughout session Sensation Sensation Light Touch: Appears  Intact Proprioception: Impaired Detail Proprioception Impaired Details: Impaired RLE;Impaired LLE(intact bil ankles; has kinesthesia, but no position sense bil great toes) Coordination Heel Shin Test: intact LLE; unable RLE Motor  Motor Motor: Abnormal tone;Hemiplegia;Motor apraxia Motor - Skilled Clinical Observations: hypertonus R hamstrings and heel cords with effort  Mobility Bed Mobility Bed Mobility: Rolling Right;Rolling Left;Right Sidelying to Sit(flat mat, no rails) Rolling Right: Contact Guard/Touching assist Rolling Left: Dependent - Patient equal 0% Right Sidelying to Sit: Maximal Assistance - Patient 25-49% Supine to Sit: Contact Guard/Touching assist Sit to Supine: Maximal Assistance - Patient 25-49% Transfers Transfers: Squat Pivot Transfers Sit to Stand: Moderate Assistance - Patient 50-74% Stand to Sit: Minimal Assistance - Patient > 75% Stand Pivot Transfers: Moderate Assistance - Patient 50 - 74% Stand Pivot Transfer Details: Tactile cues for initiation;Manual facilitation for weight bearing;Manual facilitation for placement;Verbal cues for precautions/safety Squat Pivot Transfers: Moderate Assistance - Patient 50-74% Transfer (Assistive device): None Locomotion  Gait Ambulation: Yes Gait Assistance: Maximal Assistance - Patient 25-49% Gait Distance (Feet): 30 Feet Assistive device: None Gait Assistance Details: Manual facilitation for weight shifting;Verbal cues for precautions/safety;Verbal cues for technique Gait Gait: Yes Gait Pattern: Impaired Gait Pattern: Step-through pattern;Decreased weight shift to left;Decreased dorsiflexion - right;Decreased hip/knee  flexion - right;Trunk flexed;Decreased trunk rotation(RLE advanced partially with abduction/ER) Gait velocity: Decreased Stairs / Additional Locomotion Stairs: Yes Stairs Assistance: 2 Helpers Stair Management Technique: Backwards;Forwards(ascended forwards, descended backwards) Number of Stairs: 4 Height of Stairs: 3 Wheelchair Mobility Wheelchair Mobility: Yes Wheelchair Assistance: Maximal Assistance - Patient 25 - 49% Wheelchair Propulsion: Left lower extremity(spontaneously chose to use LLE) Wheelchair Parts Management: Needs assistance Distance: 50  Trunk/Postural Assessment  Cervical Assessment Cervical Assessment: Within Functional Limits Thoracic Assessment Thoracic Assessment: Within Functional Limits Lumbar Assessment Lumbar Assessment: Exceptions to WFL(increased wt bearing R) Postural Control Postural Control: Deficits on evaluation Trunk Control: LOB to R with dynamic sitting balance, reaching within BOS to R with L hand Righting Reactions: delayed  Balance Balance Balance Assessed: Yes Static Sitting Balance Static Sitting - Balance Support: Feet supported;No upper extremity supported Static Sitting - Level of Assistance: 5: Stand by assistance Dynamic Sitting Balance Dynamic Sitting - Balance Support: No upper extremity supported;Feet supported;During functional activity Dynamic Sitting - Level of Assistance: 3: Mod assist Dynamic Sitting - Balance Activities: Reaching across midline Sitting balance - Comments: reaching distally to don pants Static Standing Balance Static Standing - Balance Support: During functional activity;Left upper extremity supported Static Standing - Level of Assistance: 3: Mod assist Dynamic Standing Balance Dynamic Standing - Balance Support: No upper extremity supported;During functional activity Dynamic Standing - Level of Assistance: 2: Max assist Dynamic Standing - Balance Activities: (gait) Dynamic Standing - Comments: during  posterior peri hygiene Extremity Assessment  RUE Assessment RUE Assessment: Exceptions to [name] Passive Range of Motion (PROM) Comments: WFL Active Range of Motion (AROM) Comments: Trace movement and reflex movements during cough observed in shoulder and possibly bicep  RUE Body System: Neuro Brunstrum levels for arm and hand: Hand;Arm Brunstrum level for arm: Stage I Presynergy Brunstrum level for hand: Stage I Flaccidity   RLE Assessment RLE Assessment: Exceptions to WFL(mild non pitting edema) Passive Range of Motion (PROM) Comments: tight heel cords: -5 degrees ankle DF General Strength Comments: grossly in sitting 1+/5 hip flexion, 3+/5 knee extnesion, 0/5 ankle DF     See Function Navigator for Current Functional Status.   Refer to Care Plan for Long Term Goals  Recommendations for other services: Therapeutic Recreation  Outing/community  reintegration  Discharge Criteria: Patient will be discharged from PT if patient refuses treatment 3 consecutive times without medical reason, if treatment goals not met, if there is a change in medical status, if patient makes no progress towards goals or if patient is discharged from hospital.  The above assessment, treatment plan, treatment alternatives and goals were discussed and mutually agreed upon: by patient and by family  Maresa Morash 08/25/2018, 12:09 PM

## 2018-08-25 NOTE — Evaluation (Signed)
Occupational Therapy Assessment and Plan  Patient Details  Name: James Olsen MRN: [id number] Date of Birth: [birth year]  OT Diagnosis: hemiplegia affecting non-dominant side and muscle weakness (generalized) Rehab Potential: Rehab Potential (ACUTE ONLY): Good ELOS: 14-17 days   Today's Date: 08/25/2018 OT Individual Time: 8416-6063 OT Individual Time Calculation (min): 75 min     Problem List:  Patient Active Problem List   Diagnosis Date Noted  . 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 Acute cerebral infarction ([place])   . Thrombocytopenia ([place])   . 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 Acute blood loss anemia   . Generalized OA   . Gastroesophageal reflux disease   . Seizure prophylaxis   . Leukocytosis   . Tumor 08/18/2018  . 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 MACULAR DEGENERATION 02/29/2008  . ALLERGY 02/29/2008  . Nonspecific (abnormal) findings on radiological and other examination of body structure 12/26/2007  . NONSPECIFIC ABNORM FIND RAD&OTH EXAM LUNG FIELD 12/26/2007  . GALLSTONES 10/01/2007  . ELEVATION, TRANSAMINASE/LDH LEVELS 09/06/2007  . HEPATITIS NOS 09/01/2007  . ABDOMINAL PAIN, EPIGASTRIC 09/01/2007    Past Medical History:  Past Medical History:  Diagnosis Date  . Arthritis   . GERD (gastroesophageal reflux disease)   . Glaucoma   . Macular degeneration   . Wears glasses    Past Surgical History:  Past Surgical History:  Procedure Laterality Date  . CHOLECYSTECTOMY  2008   lap choli  . COLONOSCOPY    . CRANIOTOMY N/A 08/18/2018   Procedure: CRANIOTOMY TUMOR EXCISION;  Surgeon: [name], MD;  Location: [facility];  Service: Neurosurgery;  Laterality: N/A;  CRANIOTOMY TUMOR EXCISION  . NAILBED REPAIR Left 05/04/2014   Procedure: LEFT INDEX NAIL ABLATION V-Y FLAP COVERAGE;  Surgeon: [name]., MD;  Location: [facility];  Service: Orthopedics;   Laterality: Left;  index  . PILONIDAL CYST EXCISION     x2  . TONSILLECTOMY    . VASECTOMY      Assessment & Plan Clinical Impression: James Olsen is a 72 year old left-handed male with history of OA, GERD, macular degeneration, progressive numbness bilateral lower extremity since earlier this year who was found to have a mass on his head consistent with extracranial and intracranial left frontal meningioma.  History taken from chart review and patient. He was admitted on 08/18/2018 for cranial with tumor excision by Dr. Christella Noa.  Postop had weakness on right as well as expressive aphasia.  MRI brain reviewed, showing left frontal brain infarct. Per report, left infarct with slight herniation left middle frontal gyrus through cranial defect and stable mass-effect on left superior frontal lobe.  Started on decadron wean on 9/3. Patient with resultant right-sided weakness with expressive aphasia affecting overall functional status.  CIR recommended for follow-up therapy. [facility].  Patient currently requires mod with basic self-care skills secondary to muscle weakness and muscle paralysis, decreased cardiorespiratoy endurance, impaired timing and sequencing, motor apraxia, ataxia, decreased coordination and decreased motor planning, decreased attention to right and decreased motor planning and decreased sitting balance, decreased standing balance, decreased postural control, hemiplegia and decreased balance strategies.  Prior to hospitalization, patient could complete ADLs/IADLs with independent .  Patient will benefit from skilled intervention to increase independence with basic self-care skills and increase level of independence with iADL prior to discharge home with care partner.  Anticipate patient will require intermittent supervision and follow up  home health.  OT - End of Session Activity Tolerance: Tolerates 10 - 20 min activity with multiple rests Endurance Deficit:  Yes Endurance Deficit Description: fatigued after gait x 30' OT Assessment Rehab Potential (ACUTE ONLY): Good OT Patient demonstrates impairments in the following area(s): Balance;Endurance;Motor;Safety OT Basic ADL's Functional Problem(s): Eating;Grooming;Bathing;Dressing;Toileting OT Transfers Functional Problem(s): Toilet;Tub/Shower OT Additional Impairment(s): Fuctional Use of Upper Extremity OT Plan OT Intensity: Minimum of 1-2 x/day, 45 to 90 minutes OT Frequency: 5 out of 7 days OT Duration/Estimated Length of Stay: 14-17 days OT Treatment/Interventions: Balance/vestibular training;Self Care/advanced ADL retraining;Therapeutic Exercise;Neuromuscular re-education;Wheelchair propulsion/positioning;Disease mangement/prevention;DME/adaptive equipment instruction;Pain management;Cognitive remediation/compensation;UE/LE Strength taining/ROM;Patient/family education;UE/LE Coordination activities;Splinting/orthotics;Functional electrical stimulation;Community reintegration;Discharge planning;Functional mobility training;Therapeutic Activities;Psychosocial support OT Self Feeding Anticipated Outcome(s): set up OT Basic Self-Care Anticipated Outcome(s): (S) OT Toileting Anticipated Outcome(s): (S) OT Bathroom Transfers Anticipated Outcome(s): (S) OT Recommendation Recommendations for Other Services: Speech consult Patient destination: Home Follow Up Recommendations: Home health OT Equipment Recommended: To be determined   Skilled Therapeutic Intervention Pt seen for skilled OT evaluation. Pt edu re ELOS, condition insight/recovery, rehab expectations, and OT POC. Pt completed bed mobility to EOB with min A. Pt able to maintain static sitting balance with SBA, however when dynamically attempting to reach distally, pt exhibited posterior lean and required vc to correct. Pt required mod A to don shirt sitting with hemi technique. Mod A required to don pants sit <> stand at EOB. Vc provided for RW  use, anticipate pt would benefit more from hemi-walker in future. Pt completed oral care at sink with set up. Pt was left sitting in w/c with all needs met.    OT Evaluation Precautions/Restrictions  Precautions Precautions: Fall Required Braces or Orthoses: Other Brace/Splint Other Brace/Splint: Resting hand splint for R UE Restrictions Weight Bearing Restrictions: No Pain Pain Assessment Pain Score: 0-No pain Home Living/Prior Functioning Home Living Family/patient expects to be discharged to:: Private residence Living Arrangements: Spouse/significant other Available Help at Discharge: Family, Available 24 hours/day Type of Home: House Home Access: Stairs to enter [employer] of Steps: 2 Entrance Stairs-Rails: None Home Layout: Two level, Able to live on main level with bedroom/bathroom Bathroom Shower/Tub: Walk-in shower, Tub/shower unit(built in seat in shower) Bathroom Toilet: Standard Bathroom Accessibility: Yes  Lives With: Spouse IADL History Homemaking Responsibilities: Yes Meal Prep Responsibility: Secondary Laundry Responsibility: Secondary Cleaning Responsibility: Secondary Bill Paying/Finance Responsibility: Secondary Shopping Responsibility: Secondary Current License: Yes Mode of Transportation: Car Occupation: Retired 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  Prior Function Level of Independence: Independent with basic ADLs, Independent with gait  Able to Take Stairs?: Yes Driving: Yes Vocation: Retired Leisure: Hobbies-yes (Comment) Comments: gardener ADL ADL ADL Comments: See functional navigator Vision Baseline Vision/History: Wears glasses Wears Glasses: At all times Patient Visual Report: No change from baseline Vision Assessment?: No apparent visual deficits Perception  Perception: Impaired Inattention/Neglect: Impaired-to be further tested in functional context Praxis Praxis: Impaired Praxis Impairment Details: Motor  planning Praxis-Other Comments: difficulty motor planning donning shirt Cognition Overall Cognitive Status: Within Functional Limits for tasks assessed Arousal/Alertness: Awake/alert Orientation Level: Person;Place;Situation Person: Oriented Place: Oriented Situation: Oriented Year: 2019 Month: September Day of Week: Incorrect(Tuesday) Memory: Appears intact Immediate Memory Recall: Sock;Blue;Bed Memory Recall: Blue;Sock Memory Recall Sock: Without Cue Memory Recall Blue: Without Cue Attention: Selective Selective Attention: Appears intact Awareness: Appears intact Problem Solving: Appears intact Sensation Sensation Light Touch: Appears Intact Proprioception: Impaired Detail Proprioception Impaired Details: Impaired RLE;Impaired LLE(intact bil ankles; has kinesthesia, but no position sense bil great toes) Coordination Gross  Motor Movements are Fluid and Coordinated: No Fine Motor Movements are Fluid and Coordinated: Yes(in dominant L UE) Heel Shin Test: intact LLE; unable RLE 9 Hole Peg Test: NT Motor  Motor Motor: Abnormal tone;Hemiplegia;Motor apraxia Motor - Skilled Clinical Observations: hypertonus R hamstrings and heel cords with effort Mobility  Bed Mobility Bed Mobility: Rolling Right;Rolling Left;Right Sidelying to Sit(flat mat, no rails) Rolling Right: Contact Guard/Touching assist Rolling Left: Dependent - Patient equal 0% Right Sidelying to Sit: Maximal Assistance - Patient 25-49% Supine to Sit: Contact Guard/Touching assist Sit to Supine: Maximal Assistance - Patient 25-49% Transfers Sit to Stand: Moderate Assistance - Patient 50-74% Stand to Sit: Minimal Assistance - Patient > 75%  Trunk/Postural Assessment  Cervical Assessment Cervical Assessment: Within Functional Limits Thoracic Assessment Thoracic Assessment: Within Functional Limits Lumbar Assessment Lumbar Assessment: Exceptions to WFL(increased wt bearing R) Postural Control Postural Control:  Deficits on evaluation Trunk Control: LOB to R with dynamic sitting balance, reaching within BOS to R with L hand Righting Reactions: delayed  Balance Balance Balance Assessed: Yes Static Sitting Balance Static Sitting - Balance Support: Feet supported;No upper extremity supported Static Sitting - Level of Assistance: 5: Stand by assistance Dynamic Sitting Balance Dynamic Sitting - Balance Support: No upper extremity supported;Feet supported;During functional activity Dynamic Sitting - Level of Assistance: 3: Mod assist Dynamic Sitting - Balance Activities: Reaching across midline Sitting balance - Comments: reaching distally to don pants Static Standing Balance Static Standing - Balance Support: During functional activity;Left upper extremity supported Static Standing - Level of Assistance: 3: Mod assist Dynamic Standing Balance Dynamic Standing - Balance Support: No upper extremity supported;During functional activity Dynamic Standing - Level of Assistance: 2: Max assist Dynamic Standing - Balance Activities: (gait) Dynamic Standing - Comments: during posterior peri hygiene Extremity/Trunk Assessment RUE Assessment RUE Assessment: Exceptions to [facility] Passive Range of Motion (PROM) Comments: WFL Active Range of Motion (AROM) Comments: Trace movement and reflex movements during cough observed in shoulder and possibly bicep  RUE Body System: Neuro Brunstrum levels for arm and hand: Hand;Arm Brunstrum level for arm: Stage I Presynergy Brunstrum level for hand: Stage I Flaccidity     See Function Navigator for Current Functional Status.   Refer to Care Plan for Long Term Goals  Recommendations for other services: None    Discharge Criteria: Patient will be discharged from OT if patient refuses treatment 3 consecutive times without medical reason, if treatment goals not met, if there is a change in medical status, if patient makes no progress towards goals or if patient is  discharged from hospital.  The above assessment, treatment plan, treatment alternatives and goals were discussed and mutually agreed upon: by patient  [name] 08/25/2018, 12:18 PM

## 2018-08-26 ENCOUNTER — Inpatient Hospital Stay (HOSPITAL_COMMUNITY): Payer: Medicare Other | Admitting: Occupational Therapy

## 2018-08-26 ENCOUNTER — Inpatient Hospital Stay (HOSPITAL_COMMUNITY): Payer: Medicare Other | Admitting: Speech Pathology

## 2018-08-26 ENCOUNTER — Other Ambulatory Visit: Payer: Self-pay | Admitting: Radiation Therapy

## 2018-08-26 ENCOUNTER — Inpatient Hospital Stay (HOSPITAL_COMMUNITY): Payer: Medicare Other | Admitting: Physical Therapy

## 2018-08-26 DIAGNOSIS — G8191 Hemiplegia, unspecified affecting right dominant side: Secondary | ICD-10-CM

## 2018-08-26 DIAGNOSIS — I63522 Cerebral infarction due to unspecified occlusion or stenosis of left anterior cerebral artery: Secondary | ICD-10-CM

## 2018-08-26 DIAGNOSIS — D320 Benign neoplasm of cerebral meninges: Secondary | ICD-10-CM

## 2018-08-26 MED ORDER — BACLOFEN 5 MG HALF TABLET
5.0000 mg | ORAL_TABLET | Freq: Two times a day (BID) | ORAL | Status: DC
  Administered 2018-08-26 – 2018-08-30 (×10): 5 mg via ORAL
  Filled 2018-08-26 (×11): qty 1

## 2018-08-26 MED ORDER — POTASSIUM CHLORIDE CRYS ER 20 MEQ PO TBCR
20.0000 meq | EXTENDED_RELEASE_TABLET | Freq: Every day | ORAL | Status: DC
  Administered 2018-08-26 – 2018-09-02 (×8): 20 meq via ORAL
  Filled 2018-08-26 (×8): qty 1

## 2018-08-26 NOTE — Care Management (Signed)
Inpatient Rehabilitation Center Individual Statement of Services  Patient Name:  James Olsen  Date:  08/26/2018  Welcome to the [facility].  Our goal is to provide you with an individualized program based on your diagnosis and situation, designed to meet your specific needs.  With this comprehensive rehabilitation program, you will be expected to participate in at least 3 hours of rehabilitation therapies Monday-Friday, with modified therapy programming on the weekends.  Your rehabilitation program will include the following services:  Physical Therapy (PT), Occupational Therapy (OT), Speech Therapy (ST), 24 hour per day rehabilitation nursing, Therapeutic Recreaction (TR), Neuropsychology, Case Management (Social Worker), Rehabilitation Medicine, Nutrition Services and Pharmacy Services  Weekly team conferences will be held on Tuesdays to discuss your progress.  Your Social Worker will talk with you frequently to get your input and to update you on team discussions.  Team conferences with you and your family in attendance may also be held.  Expected length of stay: 14-17 days   Overall anticipated outcome: supervision  Depending on your progress and recovery, your program may change. Your Social Worker will coordinate services and will keep you informed of any changes. Your Social Worker's name and contact numbers are listed  below.  The following services may also be recommended but are not provided by the [facility] will be made to provide these services after discharge if needed.  Arrangements include referral to agencies that provide these services.  Your insurance has been verified to be:  Medicare and [place] Your primary doctor is:  [facility]  Pertinent information will be shared with your doctor and your insurance  company.  Social Worker:  [place], [place] or ([phone]   Information discussed with and copy given to patient by: Lennart Pall, 08/26/2018, 3:17 PM

## 2018-08-26 NOTE — Progress Notes (Signed)
Orthopedic Tech Progress Note Patient Details:  James Olsen 10/15/1946 [id number]  Ortho Devices Type of Ortho Device: Prafo boot/shoe Ortho Device/Splint Location: rle Ortho Device/Splint Interventions: Application   Post Interventions Patient Tolerated: Well Instructions Provided: Care of device   Hildred Priest 08/26/2018, 6:30 PM

## 2018-08-26 NOTE — Progress Notes (Signed)
James Olsen PHYSICAL MEDICINE & REHABILITATION     PROGRESS NOTE    Subjective/Complaints: Up in bed. Denies headache. Ready for therapies today  ROS: Patient denies fever, rash, sore throat, blurred vision, nausea, vomiting, diarrhea, cough, shortness of breath or chest pain, joint or back pain, headache, or mood change.   Objective:  No results found. Recent Labs    08/25/18 0928  WBC 13.0*  HGB 12.3*  HCT 37.0*  PLT 205   Recent Labs    08/25/18 0928  NA 135  K 3.4*  CL 103  GLUCOSE 182*  BUN 20  CREATININE 0.90  CALCIUM 8.8*   CBG (last 3)  No results for input(s): GLUCAP in the last 72 hours.  Wt Readings from Last 3 Encounters:  08/26/18 (!) 138.9 kg  08/18/18 90.4 kg  08/10/18 90.4 kg     Intake/Output Summary (Last 24 hours) at 08/26/2018 0843 Last data filed at 08/26/2018 0520 Gross per 24 hour  Intake 480 ml  Output 400 ml  Net 80 ml    Vital Signs: Blood pressure 106/63, pulse (!) 57, temperature 97.7 F (36.5 C), temperature source Oral, resp. rate 18, height 5\' 9"  (1.753 m), weight (!) 138.9 kg, SpO2 100 %. Physical Exam:  Constitutional: No distress . Vital signs reviewed. HEENT: EOMI, oral membranes moist, scalp incision clean Neck: supple Cardiovascular: RRR without murmur. No JVD    Respiratory: CTA Bilaterally without wheezes or rales. Normal effort    GI: BS +, non-tender, non-distended  Musculoskeletal:  No edema or tenderness in extremities  Neurological: He is alert and oriented to person, place, and time.  Word finding deficits with apraxia, motor apraxia too. Motor:  RUE: 1/5 pec,biceps; 0/5 distaly RLE: HF KE 3+/5, ADF 0/5 LUE/LLE: 5/5 proximal to distal Sensation intact to light touch, except b/l feet Extensor tone RLE with tight left heel cord    Skin:  See above  Assessment/Plan: 1. Right hemiparesis and aphasia secondary to left frontal meningioma/resection which require 3+ hours per day of interdisciplinary therapy in  a comprehensive inpatient rehab setting. Physiatrist is providing close team supervision and 24 hour management of active medical problems listed below. Physiatrist and rehab team continue to assess barriers to discharge/monitor patient progress toward functional and medical goals.  Function:  Bathing Bathing position   Position: Sitting EOB  Bathing parts Body parts bathed by patient: Left upper leg, Front perineal area, Buttocks, Abdomen, Chest, Right arm, Right upper leg Body parts bathed by helper: Left arm, Right lower leg, Left lower leg, Back  Bathing assist Assist Level: (mod A)      Upper Body Dressing/Undressing Upper body dressing   What is the patient wearing?: Pull over shirt/dress     Pull over shirt/dress - Perfomed by patient: Thread/unthread left sleeve, Put head through opening Pull over shirt/dress - Perfomed by helper: Thread/unthread right sleeve, Pull shirt over trunk        Upper body assist Assist Level: (mod A)      Lower Body Dressing/Undressing Lower body dressing   What is the patient wearing?: Pants     Pants- Performed by patient: Pull pants up/down Pants- Performed by helper: Thread/unthread right pants leg, Thread/unthread left pants leg                      Lower body assist Assist for lower body dressing: Touching or steadying assistance (Pt > 75%)(mod A)      Toileting Toileting Toileting activity  did not occur: No continent bowel/bladder event        Toileting assist     Transfers Chair/bed transfer Chair/bed transfer activity did not occur: Safety/medical concerns Chair/bed transfer method: Stand pivot Chair/bed transfer assist level: Moderate assist (Pt 50 - 74%/lift or lower) Chair/bed transfer assistive device: Armrests     Locomotion Ambulation     Max distance: 30 Assist level: Maximal assist (Pt 25 - 49%)   Wheelchair   Type: Manual Max wheelchair distance: 50 Assist Level: Maximal assistance (Pt 25 -  49%)  Cognition Comprehension Comprehension assist level: Understands complex 90% of the time/cues 10% of the time  Expression Expression assist level: Expresses basic 50 - 74% of the time/requires cueing 25 - 49% of the time. Needs to repeat parts of sentences.  Social Interaction Social Interaction assist level: Interacts appropriately 90% of the time - Needs monitoring or encouragement for participation or interaction.  Problem Solving Problem solving assist level: Solves basic 25 - 49% of the time - needs direction more than half the time to initiate, plan or complete simple activities  Memory Memory assist level: More than reasonable amount of time  Medical Problem List and Plan: 1.  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  -beginning therapies today 2.  DVT Prophylaxis/Anticoagulation: Pharmaceutical: Heparin 3. Pain Management: Continue hydrocodone as needed 4. Mood: LCSW to follow for evaluation and support 5. Neuropsych: This patient is capable of making decisions on his own behalf. 6. Skin/Wound Care: Monitor incision for healing.  Routine pressure relief measures. 7. Fluids/Electrolytes/Nutrition: encourage PO  -replete potassium 8.  GERD:  Severe symptoms at this time--will change Pepcid to Protonix. Should improve once steroids weaned off.  9.  Seizure prophylaxis: Keppra twice daily 10.  Leukocytosis: Likely reactive due to steroids.  down to 13.0 on 9/5 11.  ABLA: up to 12.3 9/5 12.  Thrombocytopenia: Monitor for signs of bleeding.  recovered to 205 9/5 13. Early extensor tone RLE:   -baclofen 5mg  bid  -PRAFO    LOS (Days) 1 A FACE TO FACE EVALUATION WAS PERFORMED  Meredith Staggers, MD 08/26/2018 8:43 AM

## 2018-08-26 NOTE — Progress Notes (Signed)
Speech Language Pathology Daily Session Note  Patient Details  Name: James Olsen MRN: [id number] Date of Birth: [birth year]  Today's Date: 08/26/2018 SLP Individual Time: 1300-1355 SLP Individual Time Calculation (min): 55 min  Short Term Goals: Week 1: SLP Short Term Goal 1 (Week 1): Patient will self-monitor and correct verbal errors with Mod I.  SLP Short Term Goal 2 (Week 1): Patient will utilize word-finding strategies at the conversation level with supervision verbal cues.  SLP Short Term Goal 3 (Week 1): Patient will utilize appropriate grammatical form with use of function words at the conversation level with supervision verbal cues.   Skilled Therapeutic Interventions: Skilled treatment session focused on cognitive-linguistic goals. SLP administered the MoCA-version 7.3 and scored 23/30 points with a score of 26 or above considered normal. Patient demonstrates deficits in attention more specifically with mathematical problem solving, suspect due to language component. Patient also participated in a mildly complex verbal description task at the sentence level and required Min A multimodal cues for use of worfd-finding strategies. Patient left upright in wheelchair with all needs within reach. Continue with current plan of care.      Function:   Cognition Comprehension Comprehension assist level: Understands complex 90% of the time/cues 10% of the time  Expression   Expression assist level: Expresses basic 75 - 89% of the time/requires cueing 10 - 24% of the time. Needs helper to occlude trach/needs to repeat words.  Social Interaction Social Interaction assist level: Interacts appropriately 90% of the time - Needs monitoring or encouragement for participation or interaction.  Problem Solving Problem solving assist level: Solves basic 75 - 89% of the time/requires cueing 10 - 24% of the time  Memory Memory assist level: Recognizes or recalls 90% of the time/requires cueing < 10%  of the time    Pain No/Denies Pain   Therapy/Group: Individual Therapy  Quaneisha Hanisch 08/26/2018, 3:46 PM

## 2018-08-26 NOTE — Progress Notes (Signed)
Physical Therapy Session Note  Patient Details  Name: James Olsen MRN: [id number] Date of Birth: 1946-01-13  Today's Date: 08/26/2018 PT Individual Time: 0830-0940 PT Individual Time Calculation (min): 70 min   Short Term Goals: Week 1:  PT Short Term Goal 1 (Week 1): pt will roll L with moderate assist on flat mat without rail PT Short Term Goal 2 (Week 1): pt will transfer bed>< wc with min assist to L and R PT Short Term Goal 3 (Week 1): pt will propel w/c x 150' with supervision PT Short Term Goal 4 (Week 1): pt will perform gait x 100' with LRAD with mod assist PT Short Term Goal 5 (Week 1): pt will ascend/descend 4 6" high steps with L rail with min assist  Skilled Therapeutic Interventions/Progress Updates:    pt performs bed mobility with mod A for trunk, cues for sequencing.  Stand pivot transfers throughout session with mod A for balance and trunk control.  Gait throughout session 30' x 2, 50' x 2 with mod A with pt's Rt UE over PT's shoulder. Pt requires manual facilitaiton for trunk control and for balance reactions.  Improved Rt LE clearance noted.  Tapping 4'' step for Rt LE stance phase control with focus on trunk stability and coordination.  Supine PNFs, bridge with ball squeeze, LTR all for core strength and stability.  UE AAROM due to pt request with pt with little palpable movement.  Pt left in w/c with alarm set, needs at hand.  Therapy Documentation Precautions:  Precautions Precautions: Fall Required Braces or Orthoses: Other Brace/Splint Other Brace/Splint: Resting hand splint for R UE Restrictions Weight Bearing Restrictions: No Pain:  no c/o pain   Therapy/Group: Individual Therapy  Harue Pribble 08/26/2018, 9:40 AM

## 2018-08-26 NOTE — Progress Notes (Signed)
Patient c/o maybe" bugbite" left arm. Noted  a red bump but fades away. Some swelling noted. Pam PA notified.

## 2018-08-26 NOTE — Progress Notes (Signed)
Occupational Therapy Session Note  Patient Details  Name: James Olsen MRN: [id number] Date of Birth: 1946-01-08  Today's Date: 08/26/2018 OT Individual Time: 1100-1156 OT Individual Time Calculation (min): 56 min    Short Term Goals: Week 1:  OT Short Term Goal 1 (Week 1): Pt will require no more than min A to stand and perform clothing managment OT Short Term Goal 2 (Week 1): Pt will require no more than 1 vc for visual attention to R UE OT Short Term Goal 3 (Week 1): Pt will complete toilet transfer with min A OT Short Term Goal 4 (Week 1): Pt will thread B LE through pants with min A  Skilled Therapeutic Interventions/Progress Updates:    Pt completed bathing and dressing sit to stand from the wheelchair.  Pt completed UB bathing with min assist and max hand over hand for washing the left arm and hand.  He was able to complete LB bathing sit to stand with min assist.  Min assist  for crossing the RLE over the left knee to assist with washing the left foot and for dressing tasks. Mod demonstrational cueing with min assist to complete all dressing following hemi dressing techniques.  Finished session with pt sitting up in the wheelchair with call button and phone in reach and bed alarm in place.     Therapy Documentation Precautions:  Precautions Precautions: Fall Required Braces or Orthoses: Other Brace/Splint Other Brace/Splint: Resting hand splint for R UE Restrictions Weight Bearing Restrictions: No  Pain: Pain Assessment Pain Score: 0-No pain ADL: ADL ADL Comments: See functional navigator  See Function Navigator for Current Functional Status.   Therapy/Group: Individual Therapy  Lynnda Wiersma OTR/L 08/26/2018, 12:24 PM

## 2018-08-27 ENCOUNTER — Inpatient Hospital Stay (HOSPITAL_COMMUNITY): Payer: Medicare Other | Admitting: Speech Pathology

## 2018-08-27 ENCOUNTER — Inpatient Hospital Stay (HOSPITAL_COMMUNITY): Payer: Medicare Other | Admitting: Occupational Therapy

## 2018-08-27 ENCOUNTER — Inpatient Hospital Stay (HOSPITAL_COMMUNITY): Payer: Medicare Other

## 2018-08-27 ENCOUNTER — Inpatient Hospital Stay (HOSPITAL_COMMUNITY): Payer: Medicare Other | Admitting: Physical Therapy

## 2018-08-27 NOTE — Progress Notes (Signed)
Social Work  Social Work Assessment and Plan  Patient Details  Name: James Olsen MRN: [id number] Date of Birth: 12-21-1946  Today's Date: 08/27/2018  Problem List:  Patient Active Problem List   Diagnosis Date Noted  . 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 Acute cerebral infarction ([place])   . Thrombocytopenia ([place])   . 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 Acute blood loss anemia   . Generalized OA   . Gastroesophageal reflux disease   . Seizure prophylaxis   . Leukocytosis   . Tumor 08/18/2018  . 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 MACULAR DEGENERATION 02/29/2008  . ALLERGY 02/29/2008  . Nonspecific (abnormal) findings on radiological and other examination of body structure 12/26/2007  . NONSPECIFIC ABNORM FIND RAD&OTH EXAM LUNG FIELD 12/26/2007  . GALLSTONES 10/01/2007  . ELEVATION, TRANSAMINASE/LDH LEVELS 09/06/2007  . HEPATITIS NOS 09/01/2007  . ABDOMINAL PAIN, EPIGASTRIC 09/01/2007   Past Medical History:  Past Medical History:  Diagnosis Date  . Arthritis   . GERD (gastroesophageal reflux disease)   . Glaucoma   . Macular degeneration   . Wears glasses    Past Surgical History:  Past Surgical History:  Procedure Laterality Date  . CHOLECYSTECTOMY  2008   lap choli  . COLONOSCOPY    . CRANIOTOMY N/A 08/18/2018   Procedure: CRANIOTOMY TUMOR EXCISION;  Surgeon: [name], MD;  Location: [facility];  Service: Neurosurgery;  Laterality: N/A;  CRANIOTOMY TUMOR EXCISION  . NAILBED REPAIR Left 05/04/2014   Procedure: LEFT INDEX NAIL ABLATION V-Y FLAP COVERAGE;  Surgeon: [name]., MD;  Location: [facility];  Service: Orthopedics;  Laterality: Left;  index  . PILONIDAL CYST EXCISION     x2  . TONSILLECTOMY    . VASECTOMY     Social History:  reports that he has never smoked. He has never used smokeless tobacco. He reports that he drinks alcohol. He reports that he does  not use drugs.  Family / Support Systems Marital Status: Married How Long?: 29 yrs Patient Roles: Spouse Spouse/Significant Other: wife, James Olsen @ ([phone] Children: None Anticipated Caregiver: Spouse Ability/Limitations of Caregiver: None Caregiver Availability: 24/7 Family Dynamics: Pt describes wife as very supportive and feels she can provide any support he James need.  Note she is in good health.  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   Abuse/Neglect Abuse/Neglect Assessment Can Be Completed: Yes Physical Abuse: Denies Verbal Abuse: Denies Sexual Abuse: Denies Exploitation of patient/patient's resources: Denies Self-Neglect: Denies  Emotional Status Pt's affect, behavior adn adjustment status: Pt very pleasant and struggles with his speech as he completes assessment interview.  Apologizes when he uses incorrect word and visibly frustrated.  He was able to provide all needed info and spoke with me about dealing with diagnosis of brain tumor and now with resulting functional limitations post surgery.  He quickly denies any emotional distress, however, feel he would benefit from neuropsychology involvement for additional support.  Pt is quick to point out that tumor is "benign" and very pleased about this. Recent Psychosocial Issues: None Pyschiatric History: None Substance Abuse History: None  Patient / Family Perceptions, Expectations & Goals Pt/Family understanding of illness & functional limitations: Pt and  wife with good understanding of his medical issues and current functional limitations/ need for CIR. Premorbid pt/family roles/activities: Completely independant at home and community. Anticipated  changes in roles/activities/participation: Wife to be primary caregiver Pt/family expectations/goals: "I wish I could talk."  [employer]: None Premorbid Home Care/DME Agencies: None Transportation available at discharge: yes Resource referrals recommended: Neuropsychology  Discharge Planning Living Arrangements: Spouse/significant other Support Systems: Spouse/significant other Type of Residence: Private residence Insurance underwriter Resources: [employer], Multimedia programmer (specify)([employer]) Financial Resources: Radio broadcast assistant Screen Referred: No Living Expenses: Own Money Management: Patient Does the patient have any problems obtaining your medications?: No Home Management: pt and wife share responsibilities. Patient/Family Preliminary Plans: Pt to return home with wife as primary support and able to cover 24/7 if needed. Social Work Anticipated Follow Up Needs: HH/OP Expected length of stay: 14-17 days  Clinical Impression Very pleasant gentleman here following surgical removal of brain tumor and with some expressive aphasia, however, able to complete assessment interview without much difficulty.  Pt is hopeful about his recovery and reports tumor was 'benign" and very happy about this.  Wife is able to provide 24/7 assistance if needed and pt notes she is very supportive.  Will follow for support and d/c planning needs.  Arzu Mcgaughey 08/27/2018, 2:12 PM

## 2018-08-27 NOTE — Progress Notes (Signed)
Speech Language Pathology Daily Session Note  Patient Details  Name: MATVEY LLANAS MRN: [id number] Date of Birth: 03-Aug-1946  Today's Date: 08/27/2018 SLP Individual Time: 1100-1200 SLP Individual Time Calculation (min): 60 min  Short Term Goals: Week 1: SLP Short Term Goal 1 (Week 1): Patient will self-monitor and correct verbal errors with Mod I.  SLP Short Term Goal 2 (Week 1): Patient will utilize word-finding strategies at the conversation level with supervision verbal cues.  SLP Short Term Goal 3 (Week 1): Patient will utilize appropriate grammatical form with use of function words at the conversation level with supervision verbal cues.   Skilled Therapeutic Interventions: Skilled treatment session focused on cognitive-linguistic goals. Patient propelled himself to and from his SLP office with Min A verbal cues for avoidance of obstacles. Patient participated in mildly complex word-finding tasks with supervision verbal cues for use of word-finding strategies and for more direct and simplistic verbal descriptions to maximize opportunity for communication partner to assist with word-finding. Patient's wife present and cued patient appropriately. Patient left upright in room with family present. Continue with current plan of care.      Function:   Cognition Comprehension Comprehension assist level: Understands complex 90% of the time/cues 10% of the time  Expression   Expression assist level: Expresses basic 75 - 89% of the time/requires cueing 10 - 24% of the time. Needs helper to occlude trach/needs to repeat words.  Social Interaction Social Interaction assist level: Interacts appropriately 90% of the time - Needs monitoring or encouragement for participation or interaction.  Problem Solving Problem solving assist level: Solves basic 90% of the time/requires cueing < 10% of the time  Memory Memory assist level: Recognizes or recalls 90% of the time/requires cueing < 10% of the time     Pain Pain Assessment Pain Scale: 0-10 Pain Score: 0-No pain  Therapy/Group: Individual Therapy  Yancy Hascall, [place] 08/27/2018, 12:19 PM

## 2018-08-27 NOTE — Progress Notes (Signed)
Physical Therapy Session Note  Patient Details  Name: James Olsen MRN: [id number] Date of Birth: 26-Oct-1946  Today's Date: 08/27/2018 PT Individual Time: 0825-0935 PT Individual Time Calculation (min): 70 min   Short Term Goals: Week 1:  PT Short Term Goal 1 (Week 1): pt will roll L with moderate assist on flat mat without rail PT Short Term Goal 2 (Week 1): pt will transfer bed>< wc with min assist to L and R PT Short Term Goal 3 (Week 1): pt will propel w/c x 150' with supervision PT Short Term Goal 4 (Week 1): pt will perform gait x 100' with LRAD with mod assist PT Short Term Goal 5 (Week 1): pt will ascend/descend 4 6" high steps with L rail with min assist  Skilled Therapeutic Interventions/Progress Updates:    pt performs toilet transfers with min A and use of grab bar.  Total A for clothing negotiation and hygiene. Pt able to pull up pants independently with mod A for standing balance.  W/c mobility throughout unit supervision with hemi technique.  Gait training with ace wrap for Rt DF to prevent toe drag 150' x 2, 100' x 2 with focus on safety with turns to sit and backing up to chair. Pt min/mod A with gait with manual facilitation for wt shifts.  Attempt step ups to 6'' step without handrails with pts Rt UE over PT's shoulder, pt able to step up but requires +2 assist to step down due to Rt LE weakness.  Step up repetitions on 2'' step with mod A.  Discussed possibility of pt having an rail installed on his stairs for safety, pt agrees and states he will discuss with his wife.  nustep with focus on reducing Rt LE external rotation with pt able to perform level 4 x 6 minutes.  Pt left in room with alarm set, needs at hand.  Therapy Documentation Precautions:  Precautions Precautions: Fall Required Braces or Orthoses: Other Brace/Splint Other Brace/Splint: Resting hand splint for R UE Restrictions Weight Bearing Restrictions: No Pain: No c/o pain   Therapy/Group:  Individual Therapy  Benno Brensinger 08/27/2018, 9:42 AM

## 2018-08-27 NOTE — Progress Notes (Signed)
[facility] PHYSICAL MEDICINE & REHABILITATION     PROGRESS NOTE    Subjective/Complaints: Pt finishing breakfast. In good spirits. Noticed more movement in right hand  ROS: Patient denies fever, rash, sore throat, blurred vision, nausea, vomiting, diarrhea, cough, shortness of breath or chest pain, joint or back pain, headache, or mood change.   Objective:  No results found. Recent Labs    08/25/18 0928  WBC 13.0*  HGB 12.3*  HCT 37.0*  PLT 205   Recent Labs    08/25/18 0928  NA 135  K 3.4*  CL 103  GLUCOSE 182*  BUN 20  CREATININE 0.90  CALCIUM 8.8*   CBG (last 3)  No results for input(s): GLUCAP in the last 72 hours.  Wt Readings from Last 3 Encounters:  08/26/18 (!) 138.9 kg  08/18/18 90.4 kg  08/10/18 90.4 kg     Intake/Output Summary (Last 24 hours) at 08/27/2018 0858 Last data filed at 08/27/2018 0856 Gross per 24 hour  Intake 1020 ml  Output 900 ml  Net 120 ml    Vital Signs: Blood pressure 123/62, pulse 61, temperature 97.6 F (36.4 C), temperature source Oral, resp. rate 16, height 5\' 9"  (1.753 m), weight (!) 138.9 kg, SpO2 100 %. Physical Exam:  .Constitutional: No distress . Vital signs reviewed. HEENT: EOMI, oral membranes moist, scalp incision clean/dry Neck: supple Cardiovascular: RRR without murmur. No JVD    Respiratory: CTA Bilaterally without wheezes or rales. Normal effort    GI: BS +, non-tender, non-distended  Musculoskeletal:  No edema or tenderness in extremities  Neurological: He is alert and oriented to person, place, and time.  Word finding deficits with apraxia, motor apraxia too. Motor:  RUE: 1-2/5 pec,biceps; 1 to 2-/5 finger flexors RLE: HF KE 3+/5, ADF 0/5 LUE/LLE: 5/5 proximal to distal Sensation intact to light touch, except b/l feet Continued Extensor tone RLE.   left heel cord still tight   Skin:  See above  Assessment/Plan: 1. 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 Physiatrist is providing close team supervision and 24 hour management of active medical problems listed below. Physiatrist and rehab team continue to assess barriers to discharge/monitor patient progress toward functional and medical goals.  Function:  Bathing Bathing position   Position: Wheelchair/chair at sink  Bathing parts Body parts bathed by patient: Right arm, Chest, Abdomen, Front perineal area, Buttocks, Right upper leg, Left upper leg, Right lower leg, Left lower leg Body parts bathed by helper: Back, Left arm  Bathing assist Assist Level: (mod A)      Upper Body Dressing/Undressing Upper body dressing   What is the patient wearing?: Pull over shirt/dress     Pull over shirt/dress - Perfomed by patient: Thread/unthread left sleeve, Put head through opening, Thread/unthread right sleeve Pull over shirt/dress - Perfomed by helper: Thread/unthread right sleeve, Pull shirt over trunk        Upper body assist Assist Level: Supervision or verbal cues      Lower Body Dressing/Undressing Lower body dressing   What is the patient wearing?: Pants, Shoes, Underwear Underwear - Performed by patient: Thread/unthread right underwear leg, Thread/unthread left underwear leg, Pull underwear up/down   Pants- Performed by patient: Thread/unthread right pants leg, Thread/unthread left pants leg, Pull pants up/down Pants- Performed by helper: Thread/unthread right pants leg, Thread/unthread left pants leg         Shoes -  Performed by patient: Don/doff right shoe, Don/doff left shoe            Lower body assist Assist for lower body dressing: Touching or steadying assistance (Pt > 75%)      Toileting Toileting Toileting activity did not occur: No continent bowel/bladder event        Toileting assist     Transfers Chair/bed transfer Chair/bed transfer activity did not occur:  Safety/medical concerns Chair/bed transfer method: Stand pivot Chair/bed transfer assist level: Moderate assist (Pt 50 - 74%/lift or lower) Chair/bed transfer assistive device: Armrests     Locomotion Ambulation     Max distance: 30 Assist level: Maximal assist (Pt 25 - 49%)   Wheelchair   Type: Manual Max wheelchair distance: 50 Assist Level: Maximal assistance (Pt 25 - 49%)  Cognition Comprehension Comprehension assist level: Understands complex 90% of the time/cues 10% of the time  Expression Expression assist level: Expresses basic 75 - 89% of the time/requires cueing 10 - 24% of the time. Needs helper to occlude trach/needs to repeat words.  Social Interaction Social Interaction assist level: Interacts appropriately 90% of the time - Needs monitoring or encouragement for participation or interaction.  Problem Solving Problem solving assist level: Solves basic 75 - 89% of the time/requires cueing 10 - 24% of the time  Memory Memory assist level: Recognizes or recalls 90% of the time/requires cueing < 10% of the time  Medical Problem List and Plan: 1.  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  -continue PT, OT, SLP interventions 2.  DVT Prophylaxis/Anticoagulation: Pharmaceutical: Heparin 3. Pain Management: Continue hydrocodone as needed 4. Mood: LCSW to follow for evaluation and support 5. Neuropsych: This patient is capable of making decisions on his own behalf. 6. Skin/Wound Care: Monitor incision for healing.  Routine pressure relief measures. 7. Fluids/Electrolytes/Nutrition: encourage PO  -repleting potassium--recheck Monday 8.  GERD:  Severe symptoms at this time--now on Protonix. Should improve once steroids weaned off.  9.  Seizure prophylaxis: Keppra twice daily 10.  Leukocytosis: Likely reactive due to steroids.  down to 13.0 on 9/5--follow up Monday 11.  ABLA: up to 12.3 9/5 12.  Thrombocytopenia: Monitor for signs of  bleeding.  recovered to 205 9/5 13. Early extensor tone RLE:   -baclofen trial:  5mg  bid  -PRAFO, ROM exercises    LOS (Days) 2 A FACE TO FACE EVALUATION WAS PERFORMED  Meredith Staggers, MD 08/27/2018 8:58 AM

## 2018-08-27 NOTE — Progress Notes (Signed)
Occupational Therapy Session Note  Patient Details  Name: James Olsen MRN: [id number] Date of Birth: 06-Aug-1946  Today's Date: 08/27/2018 OT Individual Time: 1015-1100 OT Individual Time Calculation (min): 45 min    Short Term Goals: Week 1:  OT Short Term Goal 1 (Week 1): Pt will require no more than min A to stand and perform clothing managment OT Short Term Goal 2 (Week 1): Pt will require no more than 1 vc for visual attention to R UE OT Short Term Goal 3 (Week 1): Pt will complete toilet transfer with min A OT Short Term Goal 4 (Week 1): Pt will thread B LE through pants with min A Skilled Therapeutic Interventions/Progress Updates:    Pt siting in w.c awaiting therapy arrival. Session focused on b/d tasks from shower level. Pt completed 5 ft of functional mobility with mod HHA into bathroom. Moderate vc required for sequencing transfer into walk in shower with TTB. Min A required for R UE management during bathing, with vc provided re techniques to wash under L arm. Pt requried mod A to transfer out of shower with manual facilitation provided at R LE for positioning. Pt sat EOB and completed UB bathing with manual facilitation to thread R UE through sleeve. Edu provided re hemi dressing techniques for UE and LE dressing. With correction of use of figure 4 vc distal reaching, pt able to thread B LE through underwear and pull up in standing with CGA only. Pt left sitting up in w/c with wife present.   Therapy Documentation Precautions:  Precautions Precautions: Fall Required Braces or Orthoses: Other Brace/Splint Other Brace/Splint: Resting hand splint for R UE Restrictions Weight Bearing Restrictions: No    Pain: Pain Assessment Pain Scale: 0-10 Pain Score: 0-No pain ADL: ADL ADL Comments: See functional navigator  See Function Navigator for Current Functional Status.   Therapy/Group: Individual Therapy  [name] 08/27/2018, 11:02 AM

## 2018-08-27 NOTE — IPOC Note (Signed)
Overall Plan of Care [facility]) Patient Details Name: James Olsen MRN: [id number] DOB: July 05, 1946  Admitting Diagnosis: <principal problem not specified>  Hospital Problems: Active Problems:   Stroke (cerebrum) (HCC)   Acute cerebral infarction [facility])     Functional Problem List: Nursing Bladder, Bowel, Sensory, Motor, Medication Management, Edema  PT Balance, Edema, Endurance, Motor, Safety, Sensory  OT Balance, Endurance, Motor, Safety  SLP Linguistic  TR         Basic ADL's: OT Eating, Grooming, Bathing, Dressing, Toileting     Advanced  ADL's: OT       Transfers: PT Bed Mobility, Bed to Chair, Car, Manufacturing systems engineer, Metallurgist: PT Ambulation, Emergency planning/management officer, Stairs     Additional Impairments: OT Fuctional Use of Upper Extremity  SLP Communication expression, comprehension    TR      Anticipated Outcomes Item Anticipated Outcome  Self Feeding set up  Swallowing      Basic self-care  (S)  Toileting  (S)   Bathroom Transfers (S)  Bowel/Bladder  patient will be able to empty his bowel & bladder with modified independence  Transfers  supervision basic and car  Locomotion  supervision:  w/c x 150' , gait x 150' with LRAD, up/down 12 steps 1 rail with supervsion, min assist : 2 deep steps with LRAD without rails for home entry;   Communication  Mod I   Cognition     Pain  pain will be managed to be no more than 3/10 on the pain scale  Safety/Judgment  pateient will have no falls with injury while on IPrehab unit   Therapy Plan: PT Intensity: Minimum of 1-2 x/day ,45 to 90 minutes PT Frequency: 5 out of 7 days PT Duration Estimated Length of Stay: 14-17 OT Intensity: Minimum of 1-2 x/day, 45 to 90 minutes OT Frequency: 5 out of 7 days OT Duration/Estimated Length of Stay: 14-17 days SLP Intensity: Minumum of 1-2 x/day, 30 to 90 minutes SLP Frequency: 3 to 5 out of 7 days SLP Duration/Estimated Length of Stay: 14-17 days      Team Interventions: Nursing Interventions Patient/Family Education, Bladder Management, Bowel Management, Medication Management, Discharge Planning  PT interventions Ambulation/gait training, Community reintegration, DME/adaptive equipment instruction, Neuromuscular re-education, Psychosocial support, Stair training, UE/LE Strength taining/ROM, Wheelchair propulsion/positioning, Training and development officer, Discharge planning, Functional electrical stimulation, Pain management, Therapeutic Activities, UE/LE Coordination activities, Cognitive remediation/compensation, Functional mobility training, Patient/family education, Splinting/orthotics, Therapeutic Exercise  OT Interventions Balance/vestibular training, Self Care/advanced ADL retraining, Therapeutic Exercise, Neuromuscular re-education, Wheelchair propulsion/positioning, Disease mangement/prevention, DME/adaptive equipment instruction, Pain management, Cognitive remediation/compensation, UE/LE Strength taining/ROM, Patient/family education, UE/LE Coordination activities, Splinting/orthotics, Functional electrical stimulation, Community reintegration, Discharge planning, Functional mobility training, Therapeutic Activities, Psychosocial support  SLP Interventions Internal/external aids, Speech/Language facilitation, Therapeutic Activities, Patient/family education, Functional tasks, Cueing hierarchy  TR Interventions    SW/CM Interventions Discharge Planning, Psychosocial Support, Patient/Family Education   Barriers to Discharge MD  Medical stability  Nursing Incontinence was given in report that patient is continent but patient states that he cannot tell when he has had a BM & has orders for in/out cath  PT      OT      SLP      SW       Team Discharge Planning: Destination: PT-Home ,OT- Home , SLP-Home Projected Follow-up: PT-Home health PT, 24 hour supervision/assistance, OT-  Home health OT, SLP-(TBD) Projected Equipment Needs:  PT-To be determined, OT- To be determined, SLP-None recommended  by SLP Equipment Details: PT- , OT-  Patient/family involved in discharge planning: PT- Patient, Family member/caregiver,  OT-Patient, SLP-Patient  MD ELOS: 14-17 days Medical Rehab Prognosis:  Excellent Assessment: The patient has been admitted for CIR therapies with the diagnosis of left frontal meningioma/CVA. The team will be addressing functional mobility, strength, stamina, balance, safety, adaptive techniques and equipment, self-care, bowel and bladder mgt, patient and caregiver education, NMR, visual-spatial awareness, language, communication, cognition, ego support. Goals have been set at supervision for mobility and self-care, mod I for communication.    Meredith Staggers, MD, FAAPMR      See Team Conference Notes for weekly updates to the plan of care

## 2018-08-27 NOTE — Progress Notes (Signed)
Occupational Therapy Session Note  Patient Details  Name: James Olsen MRN: [id number] Date of Birth: 07-21-1946  Today's Date: 08/27/2018 OT Individual Time: 1710-1740 OT Individual Time Calculation (min): 30 min   Skilled Therapeutic Interventions/Progress Updates: focus on sitting balance, transfers and right and left upper extremity and lower extremity weight bearing and self ROM exercises.   Patient was left with call bel lwithin reach and seated in his w/c     Therapy Documentation Precautions:  Precautions Precautions: Fall Required Braces or Orthoses: Other Brace/Splint Other Brace/Splint: Resting hand splint for R UE Restrictions Weight Bearing Restrictions: No  See Function Navigator for Current Functional Status.   Therapy/Group: Individual Therapy  Alfredia Ferguson [place] 08/27/2018, 8:48 PM

## 2018-08-28 ENCOUNTER — Inpatient Hospital Stay (HOSPITAL_COMMUNITY): Payer: Medicare Other | Admitting: Physical Therapy

## 2018-08-28 ENCOUNTER — Inpatient Hospital Stay (HOSPITAL_COMMUNITY): Payer: Medicare Other

## 2018-08-28 DIAGNOSIS — K219 Gastro-esophageal reflux disease without esophagitis: Secondary | ICD-10-CM

## 2018-08-28 DIAGNOSIS — I6389 Other cerebral infarction: Secondary | ICD-10-CM

## 2018-08-28 NOTE — Progress Notes (Signed)
Physical Therapy Session Note  Patient Details  Name: James Olsen MRN: [id number] Date of Birth: [birth year]  Today's Date: 08/28/2018 PT Individual Time: 2409-7353 PT Individual Time Calculation (min): 42 min   Short Term Goals: Week 1:  PT Short Term Goal 1 (Week 1): pt will roll L with moderate assist on flat mat without rail PT Short Term Goal 2 (Week 1): pt will transfer bed>< wc with min assist to L and R PT Short Term Goal 3 (Week 1): pt will propel w/c x 150' with supervision PT Short Term Goal 4 (Week 1): pt will perform gait x 100' with LRAD with mod assist PT Short Term Goal 5 (Week 1): pt will ascend/descend 4 6" high steps with L rail with min assist  Skilled Therapeutic Interventions/Progress Updates:    Session focused on promoting of increased awareness of R UE/LE during mobility as well as improving motor planning and motor control during mobility.  Session initiated with pt lying in bed.  Pt performed dressing seated at EOB with supervision to min A for all activities.  Total A for tying shoes.  Performed stand pivot transfer to w/c with mPt ambulated 75 ft with min A with R ankle ace wrapped into DF.  Pt requires max cues for awareness of R UE t/o mobility.  Pt also performed step up with max A for R foot placement during activities (question sensory or proprioceptive deficit limiting placement?)  Following above, pt propelled back to room with v/c to attend to R.  Pt able to pathfind to his room independently. Following session pt left up in room - call bell in reach, nursing notified of pt location. Chair alarm set and nursing to check in.    Initial vitals:  124/65;  HR: 69 bpm;  Pt denied pain.  Therapy Documentation Precautions:  Precautions Precautions: Fall Required Braces or Orthoses: Other Brace/Splint Other Brace/Splint: Resting hand splint for R UE Restrictions Weight Bearing Restrictions: No   See Function Navigator for Current Functional  Status.   Therapy/Group: Individual Therapy  Shelbey Spindler Hilario Quarry 08/28/2018, 9:55 AM

## 2018-08-28 NOTE — Progress Notes (Signed)
Speech Language Pathology Daily Session Note  Patient Details  Name: James Olsen MRN: [id number] Date of Birth: [birth year]  Today's Date: 08/28/2018 SLP Individual Time: 1500-1530 SLP Individual Time Calculation (min): 30 min  Short Term Goals: Week 1: SLP Short Term Goal 1 (Week 1): Patient will self-monitor and correct verbal errors with Mod I.  SLP Short Term Goal 2 (Week 1): Patient will utilize word-finding strategies at the conversation level with supervision verbal cues.  SLP Short Term Goal 3 (Week 1): Patient will utilize appropriate grammatical form with use of function words at the conversation level with supervision verbal cues.   Skilled Therapeutic Interventions:Skilled ST services focused on speech skills. SLP facilitated word finding strategies utilizing mildly complex communication barrier task focusing on descriptive language, pt required supervision A verbal cues. Pt required supervision a verbal cues in conversation for use of word finding strategies, extra time andcsupervision A verbal cues for appropriate grammar formation . Pt was left in room with call bell within reach and bed alarm set. Recommend to continue skilled ST services.      Function:  Eating Eating   Modified Consistency Diet: No             Cognition Comprehension Comprehension assist level: Understands complex 90% of the time/cues 10% of the time  Expression   Expression assist level: Expresses basic 90% of the time/requires cueing < 10% of the time.  Social Interaction Social Interaction assist level: Interacts appropriately 90% of the time - Needs monitoring or encouragement for participation or interaction.  Problem Solving Problem solving assist level: Solves basic 90% of the time/requires cueing < 10% of the time  Memory Memory assist level: Recognizes or recalls 90% of the time/requires cueing < 10% of the time    Pain Pain Assessment Pain Score: 0-No pain  Therapy/Group:  Individual Therapy  Jahel Wavra  [facility] 08/28/2018, 3:32 PM

## 2018-08-28 NOTE — Plan of Care (Addendum)
  Problem: RH BOWEL ELIMINATION Goal: RH STG MANAGE BOWEL WITH ASSISTANCE Description STG Manage Bowel with mod I Assistance.  Outcome: progressing   Problem: RH BLADDER ELIMINATION Goal: RH STG MANAGE BLADDER WITH ASSISTANCE Description STG Manage Bladder With mod I Assistance  Outcome: progressing

## 2018-08-28 NOTE — Progress Notes (Signed)
James Olsen is a 72 y.o. male [birth year] [id number]  Subjective: No new complaints. No new problems. Slept well. Feeling OK.  Objective: Vital signs in last 24 hours: Temp:  [97.9 F (36.6 C)-98.6 F (37 C)] 97.9 F (36.6 C) (09/07 0558) Pulse Rate:  [63-71] 63 (09/07 0558) Resp:  [16-18] 18 (09/07 0558) BP: (117-121)/(64-77) 118/77 (09/07 0558) SpO2:  [100 %] 100 % (09/07 0558) Weight change:  Last BM Date: 08/26/18  Intake/Output from previous day: 09/06 0701 - 09/07 0700 In: 900 [P.O.:900] Out: 650 [Urine:650] Last cbgs: CBG (last 3)  No results for input(s): GLUCAP in the last 72 hours.   Physical Exam General: No apparent distress   HEENT: not dry Lungs: Normal effort. Lungs clear to auscultation, no crackles or wheezes. Cardiovascular: Regular rate and rhythm, no edema Abdomen: S/NT/ND; BS(+) Musculoskeletal:  unchanged Neurological: No new neurological deficits Wounds: post-op wound on the scalp Skin: clear  Aging changes Mental state: Alert, oriented, cooperative    Lab Results: BMET    Component Value Date/Time   NA 135 08/25/2018 0928   K 3.4 (L) 08/25/2018 0928   CL 103 08/25/2018 0928   CO2 24 08/25/2018 0928   GLUCOSE 182 (H) 08/25/2018 0928   BUN 20 08/25/2018 0928   CREATININE 0.90 08/25/2018 0928   CALCIUM 8.8 (L) 08/25/2018 0928   GFRNONAA >60 08/25/2018 0928   GFRAA >60 08/25/2018 0928   CBC    Component Value Date/Time   WBC 13.0 (H) 08/25/2018 0928   RBC 4.12 (L) 08/25/2018 0928   HGB 12.3 (L) 08/25/2018 0928   HCT 37.0 (L) 08/25/2018 0928   PLT 205 08/25/2018 0928   MCV 89.8 08/25/2018 0928   MCH 29.9 08/25/2018 0928   MCHC 33.2 08/25/2018 0928   RDW 13.8 08/25/2018 0928   LYMPHSABS 0.7 08/25/2018 0928   MONOABS 0.6 08/25/2018 0928   EOSABS 0.1 08/25/2018 0928   BASOSABS 0.0 08/25/2018 0928    Studies/Results: No results found.  Medications: I have reviewed the patient's current  medications.  Assessment/Plan:  1.  Right-sided hemiparesis.  Expressive aphasia.  Left frontal brain infarct.  CIR 2.  DVT prophylaxis with heparin 3.  Pain management with hydrocodone as needed 4.  Routine pressure relief measures 5.  Hypokalemia.  Monitoring potassium 6.  GERD.  On Protonix.  Weaning off steroids 7.  Seizure prophylaxis with Keppra twice daily 8.  Leukocytosis, likely reactive to steroids.  Monitoring for the signs of infection 9.  Anemia.  Monitor hemoglobin 10.  Thrombocytopenia.  Monitoring      Length of stay, days: 3  Walker Kehr , MD 08/28/2018, 1:11 PM

## 2018-08-28 NOTE — Progress Notes (Signed)
Physical Therapy Session Note  Patient Details  Name: James Olsen MRN: [id number] Date of Birth: [birth year]  Today's Date: 08/28/2018 PT Individual Time: 1306-1400 PT Individual Time Calculation (min): 54 min   Short Term Goals: Week 1:  PT Short Term Goal 1 (Week 1): pt will roll L with moderate assist on flat mat without rail PT Short Term Goal 2 (Week 1): pt will transfer bed>< wc with min assist to L and R PT Short Term Goal 3 (Week 1): pt will propel w/c x 150' with supervision PT Short Term Goal 4 (Week 1): pt will perform gait x 100' with LRAD with mod assist PT Short Term Goal 5 (Week 1): pt will ascend/descend 4 6" high steps with L rail with min assist  Skilled Therapeutic Interventions/Progress Updates:  Pt received in w/c & agreeable to tx. Pt denies c/o pain. Pt propels w/c around unit with L Hemi technique and supervision. Pt utilized cybex kinetron seated with therapist providing assistance to maintain RLE foot placement on R pedal as pt demonstrates tone in extremity; task focused on BLE strengthening & R NMR. Pt utilized dynavision standing with steady assist fade to close supervision with slightly delayed reaction time to R side of board. Progressed to task of pushing only green lights instead of red & pt able to do so with 100% accuracy. Pt with edema in B ankles (R>L) therefore ted hose donned (RN cleared) total assist for time management. Gait x 200 ft without AD with mod fade to min assist without R ankle ace wrapped for dorsiflexion assist; pt with adequate foot clearance first half of gait but required cuing for foot clearance during last 100 ft. Pt with absent heel strike RLE. Pt performed side stepping at rail in hallway with LUE support for balance with task focusing on R LE NMR and strengthening with pt demonstrating significantly more difficulty side stepping to L vs R and requires cuing for increased hip/knee flexion with only fair<>poor return demo. At end of  session pt left sitting in w/c in room with chair alarm donned, call bell in reach, & family present in room.  Therapy Documentation Precautions:  Precautions Precautions: Fall Required Braces or Orthoses: Other Brace/Splint Other Brace/Splint: Resting hand splint for R UE Restrictions Weight Bearing Restrictions: No  See Function Navigator for Current Functional Status.   Therapy/Group: Individual Therapy  Waunita Schooner 08/28/2018, 2:02 PM

## 2018-08-28 NOTE — Progress Notes (Signed)
Occupational Therapy Session Note  Patient Details  Name: James Olsen MRN: [id number] Date of Birth: 1946/03/05  Today's Date: 08/28/2018 OT Individual Time: 1000-1055 OT Individual Time Calculation (min): 55 min    Short Term Goals: Week 1:  OT Short Term Goal 1 (Week 1): Pt will require no more than min A to stand and perform clothing managment OT Short Term Goal 2 (Week 1): Pt will require no more than 1 vc for visual attention to R UE OT Short Term Goal 3 (Week 1): Pt will complete toilet transfer with min A OT Short Term Goal 4 (Week 1): Pt will thread B LE through pants with min A  Skilled Therapeutic Interventions/Progress Updates:    Pt sitting up in w/c agreeable to therapy with no c/o pain. Pt completed 200 ft of hemi w/c propulsion with mod I, however requiring several rest breaks d/t fatigue. Pt's w/c was fitted with a lap trap to optimize R UE positioning at rest, with edu provided to ensure pt carryover. Pt completed stand pivot transfer toward R and L side with CGA and vc for safety awareness. Pt completed game in prone on mat to facilitate weight bearing through R UE. Pt then completed functional reaching and [facility] task in long sitting with no support, for 15 + minutes. Pt returned to his room and was left sitting up in w/c with chair alarm set.   Therapy Documentation Precautions:  Precautions Precautions: Fall Required Braces or Orthoses: Other Brace/Splint Other Brace/Splint: Resting hand splint for R UE Restrictions Weight Bearing Restrictions: No    Pain: Pain Assessment Pain Scale: 0-10 Pain Score: 0-No pain ADL: ADL ADL Comments: See functional navigator  See Function Navigator for Current Functional Status.   Therapy/Group: Individual Therapy  [name] 08/28/2018, 10:58 AM

## 2018-08-29 DIAGNOSIS — I639 Cerebral infarction, unspecified: Secondary | ICD-10-CM

## 2018-08-29 NOTE — Progress Notes (Signed)
James Olsen is a 72 y.o. male 1946/01/06 [id number]  Subjective: No new complaints. No new problems. Slept well. Feeling OK.  Objective: Vital signs in last 24 hours: Temp:  [97.7 F (36.5 C)-98.3 F (36.8 C)] 97.7 F (36.5 C) (09/08 0501) Pulse Rate:  [62-75] 62 (09/08 0501) Resp:  [16-18] 18 (09/08 0501) BP: (109-112)/(64-66) 112/64 (09/08 0501) SpO2:  [99 %-100 %] 100 % (09/08 0501) Weight change:  Last BM Date: 08/29/18  Intake/Output from previous day: 09/07 0701 - 09/08 0700 In: 1000 [P.O.:1000] Out: -  Last cbgs: CBG (last 3)  No results for input(s): GLUCAP in the last 72 hours.   Physical Exam General: No apparent distress   HEENT: not dry Lungs: Normal effort. Lungs clear to auscultation, no crackles or wheezes. Cardiovascular: Regular rate and rhythm, no edema Abdomen: S/NT/ND; BS(+) Musculoskeletal:  unchanged Neurological: No new neurological deficits Wounds: clean   Skin: clear  Aging changes Mental state: Alert, oriented, cooperative    Lab Results: BMET    Component Value Date/Time   NA 135 08/25/2018 0928   K 3.4 (L) 08/25/2018 0928   CL 103 08/25/2018 0928   CO2 24 08/25/2018 0928   GLUCOSE 182 (H) 08/25/2018 0928   BUN 20 08/25/2018 0928   CREATININE 0.90 08/25/2018 0928   CALCIUM 8.8 (L) 08/25/2018 0928   GFRNONAA >60 08/25/2018 0928   GFRAA >60 08/25/2018 0928   CBC    Component Value Date/Time   WBC 13.0 (H) 08/25/2018 0928   RBC 4.12 (L) 08/25/2018 0928   HGB 12.3 (L) 08/25/2018 0928   HCT 37.0 (L) 08/25/2018 0928   PLT 205 08/25/2018 0928   MCV 89.8 08/25/2018 0928   MCH 29.9 08/25/2018 0928   MCHC 33.2 08/25/2018 0928   RDW 13.8 08/25/2018 0928   LYMPHSABS 0.7 08/25/2018 0928   MONOABS 0.6 08/25/2018 0928   EOSABS 0.1 08/25/2018 0928   BASOSABS 0.0 08/25/2018 0928    Studies/Results: No results found.  Medications: I have reviewed the patient's current medications.  Assessment/Plan:    1.  Hemiparesis  on the right.  Expressive aphasia.  Status post left frontal brain infarct.  Continue with CIR 2.  DVT prophylaxis with heparin 3.  Pain management with as needed hydrocodone 4.  Routine pressure relief measures 5.  Hypokalemia.  Monitoring potassium 6.  GERD.  Continue with Protonix.  Weaning off steroids 7.  Seizure prophylaxis with Keppra twice a day 8.  Leukocytosis, likely related to steroids.  Monitoring for the signs of infection 9.  Anemia.  Check hemoglobin next week 10.  Thrombocytopenia.  His last platelet count was normal.  Check CBC next week     Length of stay, days: 4  Walker Kehr , MD 08/29/2018, 1:10 PM

## 2018-08-30 ENCOUNTER — Inpatient Hospital Stay (HOSPITAL_COMMUNITY): Payer: Medicare Other | Admitting: Physical Therapy

## 2018-08-30 ENCOUNTER — Inpatient Hospital Stay (HOSPITAL_COMMUNITY): Payer: Medicare Other | Admitting: Speech Pathology

## 2018-08-30 ENCOUNTER — Inpatient Hospital Stay (HOSPITAL_COMMUNITY): Payer: Medicare Other

## 2018-08-30 ENCOUNTER — Other Ambulatory Visit: Payer: Medicare Other

## 2018-08-30 LAB — BASIC METABOLIC PANEL
Anion gap: 8 (ref 5–15)
BUN: 16 mg/dL (ref 8–23)
CO2: 26 mmol/L (ref 22–32)
Calcium: 8.5 mg/dL — ABNORMAL LOW (ref 8.9–10.3)
Chloride: 107 mmol/L (ref 98–111)
Creatinine, Ser: 0.86 mg/dL (ref 0.61–1.24)
GFR calc Af Amer: >60 mL/min (ref >60)
GFR calc non Af Amer: >60 mL/min (ref >60)
Glucose, Bld: 116 mg/dL — ABNORMAL HIGH (ref 70–99)
Potassium: 3.7 mmol/L (ref 3.5–5.1)
Sodium: 141 mmol/L (ref 135–145)

## 2018-08-30 LAB — CBC
HCT: 36.3 % — ABNORMAL LOW (ref 39.0–52.0)
Hemoglobin: 11.5 g/dL — ABNORMAL LOW (ref 13.0–17.0)
MCH: 29.9 pg (ref 26.0–34.0)
MCHC: 31.7 g/dL (ref 30.0–36.0)
MCV: 94.5 fL (ref 78.0–100.0)
Platelets: 225 10*3/uL (ref 150–400)
RBC: 3.84 MIL/uL — ABNORMAL LOW (ref 4.22–5.81)
RDW: 14.2 % (ref 11.5–15.5)
WBC: 9.4 10*3/uL (ref 4.0–10.5)

## 2018-08-30 NOTE — Progress Notes (Signed)
Speech Language Pathology Daily Session Note  Patient Details  Name: James Olsen MRN: [id number] Date of Birth: 11/24/1946  Today's Date: 08/30/2018 SLP Individual Time: 1000-1100 SLP Individual Time Calculation (min): 60 min  Short Term Goals: Week 1: SLP Short Term Goal 1 (Week 1): Patient will self-monitor and correct verbal errors with Mod I.  SLP Short Term Goal 2 (Week 1): Patient will utilize word-finding strategies at the conversation level with supervision verbal cues.  SLP Short Term Goal 3 (Week 1): Patient will utilize appropriate grammatical form with use of function words at the conversation level with supervision verbal cues.   Skilled Therapeutic Interventions: Skilled treatment session focused on communication goals. SLP facilitated session by providing intermittent supervision to Mod I for use of word-strategies at abstract language level. Pt able to describe abstract topics and self-corrected with Mod I cues. Pt was returned to room, left upright in wheelchair with all needs within reach. Continue per current plan of care.      Function:    Cognition Comprehension Comprehension assist level: Understands complex 90% of the time/cues 10% of the time;Follows complex conversation/direction with extra time/assistive device  Expression   Expression assist level: Expresses basic needs/ideas: With no assist;Expresses complex 90% of the time/cues < 10% of the time  Social Interaction Social Interaction assist level: Interacts appropriately with others with medication or extra time (anti-anxiety, antidepressant).  Problem Solving Problem solving assist level: Solves complex 90% of the time/cues < 10% of the time;Solves complex problems: With extra time  Memory Memory assist level: Complete Independence: No helper;Assistive device: No helper    Pain Pain Assessment Pain Scale: 0-10 Pain Score: 0-No pain  Therapy/Group: Individual Therapy  James Olsen 08/30/2018,  4:14 PM

## 2018-08-30 NOTE — Progress Notes (Signed)
Occupational Therapy Session Note  Patient Details  Name: James Olsen MRN: [id number] Date of Birth: [birth year]  Today's Date: 08/30/2018 OT Individual Time: 1300-1355 Session 2: 4562-5638 OT Individual Time Calculation (min): 55 min  Session 2: 23 min   Short Term Goals: Week 1:  OT Short Term Goal 1 (Week 1): Pt will require no more than min A to stand and perform clothing managment OT Short Term Goal 2 (Week 1): Pt will require no more than 1 vc for visual attention to R UE OT Short Term Goal 3 (Week 1): Pt will complete toilet transfer with min A OT Short Term Goal 4 (Week 1): Pt will thread B LE through pants with min A  Skilled Therapeutic Interventions/Progress Updates:   Pt sitting up in w/c agreeable to therapy with no c/o pain. Session focused on hemi b/d tasks with introduction of several pieces of AE/AD to compensate for bimanual tasks. Pt completed functional mobility into walk in shower with CGA and completed UB/LB bathing with (S) from sit <> stand. Min A required for donning button up shirt seated with questioning cues provided for orienting shirt. Demo provided re uni-manual buttoning technique vs use of a button hook, with pt preferring uni-manual technique and completing with min A. Pt demo great progress with LB dressing, donning underwear and pants with (S). Pt then donned shoes and required max A to fasten. Shoe buttons were obtained and fastened and pt given demo re use. Pt left sitting up in w/c with all needs met and chair alarm set.   Session 2: Pt sitting up in w/c agreeable to therapy with no c/o pain. Session focused on functional mobility and L UE strengthening/endurance through 300 ft of w/c propulsion. Pt completed 100 ft of functional mobility over uneven brick surface with close CGA. Pt experienced no LOB, with vc provided for increasing visual scanning during ambulation. Pt returned to room and left sitting up in w/c with all needs met and chair alarm set.    Therapy Documentation Precautions:  Precautions Precautions: Fall Required Braces or Orthoses: Other Brace/Splint Other Brace/Splint: Resting hand splint for R UE Restrictions Weight Bearing Restrictions: No Pain: Pain Assessment Pain Scale: 0-10 Pain Score: 0-No pain ADL: ADL ADL Comments: See functional navigator  See Function Navigator for Current Functional Status.   Therapy/Group: Individual Therapy  [name] 08/30/2018, 1:59 PM

## 2018-08-30 NOTE — Discharge Summary (Signed)
Physician Discharge Summary  Patient ID: James Olsen MRN: [id number] DOB/AGE: 1945-12-25 72 y.o.  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 was admitted for elective resection of a   Treatments: surgery: Craniectomy for partial tumor resection.   Discharge Exam: Blood pressure 126/74, pulse 69, temperature 99 F (37.2 C), temperature source Oral, resp. rate 18, height 5\' 9"  (1.753 m), weight 90.4 kg, SpO2 97 %. alert, oriented x 4, speech is clear, fluent at discharge. plegic in right upper extremity. wound is clean dry, without signs of infection. moving lower extremities well  Disposition:  Meningioma  Allergies as of 08/25/2018   No Known Allergies     Medication List    TAKE these medications   acetaminophen 500 MG tablet Commonly known as:  TYLENOL Take 500 mg by mouth daily as needed for moderate pain or headache.   Fish Oil 1200 MG Caps Take 1,200 mg by mouth every evening.   PRESERVISION AREDS 2 Caps Take 1 tablet by mouth 2 (two) times daily.   ranitidine 150 MG tablet Commonly known as:  ZANTAC Take 150 mg by mouth daily as needed for heartburn.   Red Yeast Rice 600 MG Caps Take 1,200 mg by mouth daily.      Follow-up Information    [name], MD Follow up in 3 week(s).   Specialty:  Neurosurgery Why:  please call to make an appointment Contact information: [address] [place] [address] [phone]           Signed: Winfield Cunas 08/30/2018, 5:49 PM

## 2018-08-30 NOTE — Progress Notes (Signed)
Physical Therapy Session Note  Patient Details  Name: James Olsen MRN: [id number] Date of Birth: [birth year]  Today's Date: 08/30/2018 PT Individual Time: 0825-0923 PT Individual Time Calculation (min): 58 min   Short Term Goals: Week 1:  PT Short Term Goal 1 (Week 1): pt will roll L with moderate assist on flat mat without rail PT Short Term Goal 2 (Week 1): pt will transfer bed>< wc with min assist to L and R PT Short Term Goal 3 (Week 1): pt will propel w/c x 150' with supervision PT Short Term Goal 4 (Week 1): pt will perform gait x 100' with LRAD with mod assist PT Short Term Goal 5 (Week 1): pt will ascend/descend 4 6" high steps with L rail with min assist  Skilled Therapeutic Interventions/Progress Updates:    pt performs w/c mobility throughout unit with hemi technique with supervision.  Gait with foot up brace 200', 150' with min A, pt with increased toe drag when fatigued, responds well to verbal cues.  Side and backward gait at rail in hallway with min manual facilitation for wt shifts and tactile cues for glute and hip mm contraction. Pt with difficultly with side stepping Lt due to weakness in Rt adductors.  Sit <> stand repetitions with adductor squeeze and with Lt LE on dyna disc to bias Rt LE. Pt min A with cues for eccentric control.  Pt performs UE AAROM per request for shoulder mm activation.  Pt left in room with needs at hand, alarm set.  Therapy Documentation Precautions:  Precautions Precautions: Fall Required Braces or Orthoses: Other Brace/Splint Other Brace/Splint: Resting hand splint for R UE Restrictions Weight Bearing Restrictions: No Pain: No c/o pain   Therapy/Group: Individual Therapy  Niomi Valent 08/30/2018, 9:24 AM

## 2018-08-30 NOTE — Progress Notes (Signed)
[facility]ILIT[facility]     PROGRESS NOTE    Subjective/Complaints: No new issues this morning. Experiencing more movement in his RUE  ROS: Patient denies fever, rash, sore throat, blurred vision, nausea, vomiting, diarrhea, cough, shortness of breath or chest pain, joint or back pain, headache, or mood change.    Objective:  No results found. Recent Labs    08/30/18 0524  WBC 9.4  HGB 11.5*  HCT 36.3*  PLT 225   Recent Labs    08/30/18 0524  NA 141  K 3.7  CL 107  GLUCOSE 116*  BUN 16  CREATININE 0.86  CALCIUM 8.5*   CBG (last 3)  No results for input(s): GLUCAP in the last 72 hours.  Wt Readings from Last 3 Encounters:  08/27/18 90.9 kg  08/18/18 90.4 kg  08/10/18 90.4 kg     Intake/Output Summary (Last 24 hours) at 08/30/2018 0938 Last data filed at 08/30/2018 0743 Gross per 24 hour  Intake 480 ml  Output 850 ml  Net -370 ml    Vital Signs: Blood pressure 114/72, pulse (!) 59, temperature 97.7 F (36.5 C), resp. rate 18, height 5\' 9"  (1.753 m), weight 90.9 kg, SpO2 100 %. Physical Exam:  Constitutional: No distress . Vital signs reviewed. HEENT: EOMI, oral membranes moist Neck: supple Cardiovascular: RRR without murmur. No JVD    Respiratory: CTA Bilaterally without wheezes or rales. Normal effort    GI: BS +, non-tender, non-distended  Musculoskeletal:  No edema or tenderness in extremities  Neurological: He is alert and oriented to person, place, and time.  Improving language/apraxia Motor:  RUE: 1-2/5 pec,biceps; 1 to 2-/5 finger flexors--->more consistent RLE: HF KE 3+/5, ADF 0/5 LUE/LLE: 5/5 proximal to distal Sensation intact to light touch, except b/l feet--unchanged 1-2/4 Extensor tone RLE.   left heel cord still tight   Skin:  Cranial wound intact  Assessment/Plan: 1. 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 Physiatrist is providing close team supervision and 24 hour management of active medical problems listed below. Physiatrist and rehab team continue to assess barriers to discharge/monitor patient progress toward functional and medical goals.  Function:  Bathing Bathing position   Position: Shower  Bathing parts Body parts bathed by patient: Right arm, Chest, Abdomen, Front perineal area, Buttocks, Right upper leg, Left upper leg, Right lower leg, Left lower leg Body parts bathed by helper: Left arm, Back  Bathing assist Assist Level: Touching or steadying assistance(Pt > 75%)      Upper Body Dressing/Undressing Upper body dressing   What is the patient wearing?: Pull over shirt/dress     Pull over shirt/dress - Perfomed by patient: Thread/unthread left sleeve, Put head through opening, Pull shirt over trunk Pull over shirt/dress - Perfomed by helper: Thread/unthread right sleeve        Upper body assist Assist Level: Touching or steadying assistance(Pt > 75%)      Lower Body Dressing/Undressing Lower body dressing   What is the patient wearing?: Pants, Shoes, Underwear Underwear - Performed by patient: Thread/unthread right underwear leg, Thread/unthread left underwear leg, Pull underwear up/down   Pants- Performed by patient: Thread/unthread right pants leg, Thread/unthread left pants leg, Pull pants up/down Pants- Performed by helper: Thread/unthread right pants leg, Thread/unthread left pants leg         Shoes - Performed by patient: Don/doff right shoe, Don/doff left shoe  Lower body assist Assist for lower body dressing: Touching or steadying assistance (Pt > 75%)      Toileting Toileting Toileting activity did not occur: No continent bowel/bladder event Toileting steps completed by patient: Adjust clothing prior to toileting Toileting steps completed by helper: Performs perineal hygiene, Adjust clothing after  toileting Toileting Assistive Devices: Grab bar or rail  Toileting assist Assist level: Touching or steadying assistance (Pt.75%)   Transfers Chair/bed transfer Chair/bed transfer activity did not occur: Safety/medical concerns Chair/bed transfer method: Stand pivot Chair/bed transfer assist level: Touching or steadying assistance (Pt > 75%) Chair/bed transfer assistive device: Armrests     Locomotion Ambulation     Max distance: 200 ft Assist level: Touching or steadying assistance (Pt > 75%)   Wheelchair   Type: Manual Max wheelchair distance: 150 ft Assist Level: Supervision or verbal cues  Cognition Comprehension Comprehension assist level: Understands complex 90% of the time/cues 10% of the time  Expression Expression assist level: Expresses basic 90% of the time/requires cueing < 10% of the time.  Social Interaction Social Interaction assist level: Interacts appropriately 90% of the time - Needs monitoring or encouragement for participation or interaction.  Problem Solving Problem solving assist level: Solves basic 90% of the time/requires cueing < 10% of the time  Memory Memory assist level: Recognizes or recalls 90% of the time/requires cueing < 10% of the time  Medical Problem List and Plan: 1.  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  -continue PT, OT, SLP   2.  DVT Prophylaxis/Anticoagulation: Pharmaceutical: Heparin 3. Pain Management: Continue hydrocodone as needed 4. Mood: LCSW to follow for evaluation and support 5. Neuropsych: This patient is capable of making decisions on his own behalf. 6. Skin/Wound Care: Monitor incision for healing.  Routine pressure relief measures. 7. Fluids/Electrolytes/Nutrition: encourage PO  -repleting potassium--up to 3/7 on 9/9 8.  GERD:  Severe symptoms at this time--now on Protonix. Should improve once steroids weaned off.  9.  Seizure prophylaxis: Keppra twice daily 10.  Leukocytosis:  Likely reactive due to steroids.  down to 13.0 on 9/5--follow up Monday 11.  ABLA: 11.5 9/9 12.  Thrombocytopenia: Monitor for signs of bleeding.  recovered to 225 9/9 13. Early extensor tone RLE:   -baclofen trial:  5mg  bid, consider further titration this week  -PRAFO, ROM exercises    LOS (Days) 5 A FACE TO FACE EVALUATION WAS PERFORMED  Meredith Staggers, MD 08/30/2018 9:38 AM

## 2018-08-31 ENCOUNTER — Inpatient Hospital Stay (HOSPITAL_COMMUNITY): Payer: Medicare Other

## 2018-08-31 ENCOUNTER — Inpatient Hospital Stay (HOSPITAL_COMMUNITY): Payer: Medicare Other | Admitting: Physical Therapy

## 2018-08-31 ENCOUNTER — Inpatient Hospital Stay (HOSPITAL_COMMUNITY): Payer: Medicare Other | Admitting: Speech Pathology

## 2018-08-31 MED ORDER — INFLUENZA VAC SPLIT HIGH-DOSE 0.5 ML IM SUSY
0.5000 mL | PREFILLED_SYRINGE | INTRAMUSCULAR | Status: AC
  Administered 2018-09-01: 0.5 mL via INTRAMUSCULAR
  Filled 2018-08-31: qty 0.5

## 2018-08-31 MED ORDER — BACLOFEN 10 MG PO TABS
10.0000 mg | ORAL_TABLET | Freq: Two times a day (BID) | ORAL | Status: DC
  Administered 2018-08-31 – 2018-09-04 (×9): 10 mg via ORAL
  Filled 2018-08-31 (×9): qty 1

## 2018-08-31 NOTE — Progress Notes (Addendum)
[facility] PHYSICAL MEDICINE & REHABILITATION     PROGRESS NOTE    Subjective/Complaints: Pt doing well. Sees progress from a functional standpoint  ROS: Patient denies fever, rash, sore throat, blurred vision, nausea, vomiting, diarrhea, cough, shortness of breath or chest pain, joint or back pain, headache, or mood change.     Objective:  No results found. Recent Labs    08/30/18 0524  WBC 9.4  HGB 11.5*  HCT 36.3*  PLT 225   Recent Labs    08/30/18 0524  NA 141  K 3.7  CL 107  GLUCOSE 116*  BUN 16  CREATININE 0.86  CALCIUM 8.5*   CBG (last 3)  No results for input(s): GLUCAP in the last 72 hours.  Wt Readings from Last 3 Encounters:  08/31/18 91 kg  08/18/18 90.4 kg  08/10/18 90.4 kg     Intake/Output Summary (Last 24 hours) at 08/31/2018 0841 Last data filed at 08/31/2018 0823 Gross per 24 hour  Intake 600 ml  Output 850 ml  Net -250 ml    Vital Signs: Blood pressure 114/65, pulse 60, temperature 98 F (36.7 C), temperature source Oral, resp. rate 19, height 5\' 9"  (1.753 m), weight 91 kg, SpO2 99 %. Physical Exam:  Constitutional: No distress . Vital signs reviewed. HEENT: EOMI, oral membranes moist Neck: supple Cardiovascular: RRR without murmur. No JVD    Respiratory: CTA Bilaterally without wheezes or rales. Normal effort    GI: BS +, non-tender, non-distended  Musculoskeletal:  Right heel cord tight Neurological: He is alert and oriented to person, place, and time.  Improving language, much more fluid Motor:  RUE:  2/5 pec,biceps; 2/5 finger flexors--->more consistent movement RLE: HF KE 3+/5, ADF 0 to tr5 LUE/LLE: 5/5 proximal to distal Sensation intact to light touch, except b/l feet--unchanged 1-2/4 Extensor tone RLE.     Skin:  Cranial wound intact, staples out  Assessment/Plan: 1. 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 Physiatrist is providing close team supervision and 24 hour management of active medical problems listed below. Physiatrist and rehab team continue to assess barriers to discharge/monitor patient progress toward functional and medical goals.  Function:  Bathing Bathing position   Position: Shower  Bathing parts Body parts bathed by patient: Right arm, Chest, Abdomen, Front perineal area, Buttocks, Right upper leg, Left upper leg, Right lower leg, Left lower leg Body parts bathed by helper: Left arm, Back  Bathing assist Assist Level: Touching or steadying assistance(Pt > 75%)      Upper Body Dressing/Undressing Upper body dressing   What is the patient wearing?: Pull over shirt/dress     Pull over shirt/dress - Perfomed by patient: Thread/unthread left sleeve, Put head through opening, Pull shirt over trunk Pull over shirt/dress - Perfomed by helper: Thread/unthread right sleeve        Upper body assist Assist Level: Touching or steadying assistance(Pt > 75%)      Lower Body Dressing/Undressing Lower body dressing   What is the patient wearing?: Pants, Shoes, Underwear Underwear - Performed by patient: Thread/unthread right underwear leg, Thread/unthread left underwear leg, Pull underwear up/down   Pants- Performed by patient: Thread/unthread right pants leg, Thread/unthread left pants leg, Pull pants up/down Pants- Performed by helper: Thread/unthread right pants leg, Thread/unthread left pants leg         Shoes - Performed by patient: Don/doff right shoe, Don/doff left shoe  Lower body assist Assist for lower body dressing: Touching or steadying assistance (Pt > 75%)      Toileting Toileting Toileting activity did not occur: No continent bowel/bladder event Toileting steps completed by patient: Adjust clothing prior to toileting Toileting steps completed by helper: Performs perineal hygiene, Adjust clothing after  toileting Toileting Assistive Devices: Grab bar or rail  Toileting assist Assist level: Touching or steadying assistance (Pt.75%)   Transfers Chair/bed transfer Chair/bed transfer activity did not occur: Safety/medical concerns Chair/bed transfer method: Stand pivot Chair/bed transfer assist level: Touching or steadying assistance (Pt > 75%) Chair/bed transfer assistive device: Armrests     Locomotion Ambulation     Max distance: 200 ft Assist level: Touching or steadying assistance (Pt > 75%)   Wheelchair   Type: Manual Max wheelchair distance: 150 ft Assist Level: Supervision or verbal cues  Cognition Comprehension Comprehension assist level: Understands complex 90% of the time/cues 10% of the time, Follows complex conversation/direction with extra time/assistive device  Expression Expression assist level: Expresses basic needs/ideas: With no assist, Expresses complex 90% of the time/cues < 10% of the time  Social Interaction Social Interaction assist level: Interacts appropriately with others with medication or extra time (anti-anxiety, antidepressant).  Problem Solving Problem solving assist level: Solves complex 90% of the time/cues < 10% of the time, Solves complex problems: With extra time  Memory Memory assist level: Complete Independence: No helper, Assistive device: No helper  Medical Problem List and Plan: 1.  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  -continue PT, OT, SLP   -team conf today  2.  DVT Prophylaxis/Anticoagulation: Pharmaceutical: Heparin 3. Pain Management: Continue hydrocodone as needed 4. Mood: LCSW to follow for evaluation and support 5. Neuropsych: This patient is capable of making decisions on his own behalf. 6. Skin/Wound Care: Monitor incision for healing.  Routine pressure relief measures. 7. Fluids/Electrolytes/Nutrition: encourage PO  -repleting potassium--up to 3/7 on 9/9 8.  GERD:  protonix 9.   Seizure prophylaxis: Keppra twice daily 10.  Leukocytosis: Likely reactive due to steroids.  resolved 11.  ABLA: 11.5 9/9 12.  Thrombocytopenia: Monitor for signs of bleeding.  recovered to 225 9/9 13. Early extensor tone RLE:   -baclofen trial:  5mg  bid. Increase to 10mg  bid  -PRAFO, ROM exercises reviewed today   LOS (Days) 6 A FACE TO FACE EVALUATION WAS PERFORMED  Meredith Staggers, MD 08/31/2018 8:41 AM

## 2018-08-31 NOTE — Progress Notes (Signed)
Brain and Spine Tumor Board Documentation  James Olsen was presented by Cecil Cobbs, MD at Brain and Spine Tumor Board on 08/31/2018, which included representatives from neuro oncology, radiation oncology, surgical oncology, radiology, pathology, navigation, genetics.  James Olsen was presented as a current patient with history of the following treatments:  .  Additionally, we reviewed previous medical and familial history, history of present illness, and recent lab results along with all available histopathologic and imaging studies. The tumor board considered available treatment options and made the following recommendations:  Radiation therapy (primary modality)  Following cranioplasty  Tumor board is a meeting of clinicians from various specialty areas who evaluate and discuss patients for whom a multidisciplinary approach is being considered. Final determinations in the plan of care are those of the provider(s). The responsibility for follow up of recommendations given during tumor board is that of the provider.   Today's extended care, comprehensive team conference, James Olsen was not present for the discussion and was not examined.

## 2018-08-31 NOTE — Discharge Summary (Signed)
Physician Discharge Summary  Patient ID: James Olsen MRN: [id number] DOB/AGE: 71-Aug-1947 71 y.o.  Admit date: 08/25/2018 Discharge date: 09/04/2018  Discharge Diagnoses:  Principal Problem:   Acute cerebral infarction [facility]) Active Problems:   Acute blood loss anemia   Seizure prophylaxis   Discharged Condition: stable   Significant Diagnostic Studies: Ct Head Wo Contrast  Result Date: 08/18/2018 CLINICAL DATA:  RIGHT-sided weakness after meningioma removal. EXAM: CT HEAD WITHOUT CONTRAST TECHNIQUE: Contiguous axial images were obtained from the base of the skull through the vertex without intravenous contrast. COMPARISON:  Preoperative CT and MR studies 03/02/2018 and 07/15/2018. FINDINGS: Brain: Vasogenic edema in the superior aspect of the LEFT frontal lobe, not appreciably changed from most recent MR when technique differences are considered. There is no parenchymal hemorrhage or visible acute cortical infarction. Significant improvement in overall tumor bulk based on preoperative appearance. Pneumocephalus and soft tissue prominence within the surgical bed, not unexpected. No midline shift. No visible extra-axial hematoma. Vascular: No proximal hyperdense vessel. Small bubbles of air are seen within the superior sagittal sinus, not unexpected given the tumor's location and invasion of same. No non-contrast CT findings to suggest sinus thrombosis. Skull: Uncomplicated appearing craniectomy defect LEFT frontal vertex. Bifrontal scalp flap. Sinuses/Orbits: No layering sinus fluid or significant opacity. Negative orbits. Other: None. IMPRESSION: Satisfactory appearing postoperative CT following LEFT frontal extra-axial tumor removal, with preoperative diagnosis of aggressive intracranial and extracranial meningioma; significant tumor debulking via LEFT frontal craniectomy. No visible parenchymal hemorrhage, extra-axial hematoma, or acute infarction. LEFT frontal vasogenic edema appears  similar to preoperative MR. Tiny bubbles of air in the superior sagittal sinus, not unexpected given location of recent surgery. Electronically Signed   By: Staci Righter M.D.   On: 08/18/2018 17:04   Mr Jeri Cos [place] Contrast  Result Date: 08/19/2018 CLINICAL DATA:  72 y/o M; craniotomy and tumor resection for follow-up. EXAM: MRI HEAD WITHOUT AND WITH CONTRAST TECHNIQUE: Multiplanar, multiecho pulse sequences of the brain and surrounding structures were obtained without and with intravenous contrast. CONTRAST:  33mL MULTIHANCE GADOBENATE DIMEGLUMINE 529 MG/ML IV SOLN COMPARISON:  07/15/2018 MRI head.  08/18/2018 CT head. FINDINGS: Brain: Interval resection of a left superior frontal meningioma with craniectomy. The resection cavity is filled with fluid, foci of air, and blood products with susceptibility blooming. There is a small rim of reduced diffusion at the inferior margin of the cavity extending to the ventricle compatible with acute ischemia. There is residual neoplasm at the superomedial aspect of the resection cavity along the falx and invading the superior sagittal sinus which measures 3.3 x 2.1 x 1.7 cm (AP x ML x CC series 17, image 15 and series 16, image 48). There is stable vasogenic edema within subcortical white matter of the left superior frontal gyrus. Diminished mass effect on the brain with very slight effacement of the left lateral ventricle which is displaced inferiorly period no significant midline shift. A small portion of the left middle frontal gyrus slightly herniates through the craniectomy defect (series 15, image 16). No new focus of abnormal enhancement, hemorrhage, or focal mass effect elsewhere in the brain. Mild smooth diffuse dural enhancement is compatible with recent postsurgical changes. Vascular: Normal flow voids. Skull and upper cervical spine: Postsurgical changes related to a left superior frontal craniectomy with mild edema and enhancement in the overlying scalp.  Sinuses/Orbits: Left maxillary sinus mucous retention cyst. Otherwise there is no significant abnormal signal of the paranasal sinuses or mastoid air cells. Other: None. IMPRESSION: 1.  Interval left superior frontal craniectomy and tumor resection. 2. Residual neoplasm at the superomedial aspect of the resection cavity along the falx and invading superior sagittal sinus measuring 3.2 x 2.1 x 1.7 cm (AP x ML x CC). 3. Small focus of acute ischemia at the inferior cavity margin extending to the ventricle. 4. Slight herniation of left middle frontal gyrus through the craniectomy defect. 5. Stable edema within the left superior frontal lobe white matter. Improved mass effect on the brain post resection. Electronically Signed   By: Kristine Garbe M.D.   On: 08/19/2018 23:18    Labs:  Basic Metabolic Panel: BMP Latest Ref Rng & Units 08/30/2018 08/25/2018 08/18/2018  Glucose 70 - 99 mg/dL 116(H) 182(H) -  BUN 8 - 23 mg/dL 16 20 -  Creatinine 0.61 - 1.24 mg/dL 0.86 0.90 0.80  Sodium 135 - 145 mmol/L 141 135 -  Potassium 3.5 - 5.1 mmol/L 3.7 3.4(L) -  Chloride 98 - 111 mmol/L 107 103 -  CO2 22 - 32 mmol/L 26 24 -  Calcium 8.9 - 10.3 mg/dL 8.5(L) 8.8(L) -    CBC: CBC Latest Ref Rng & Units 08/30/2018 08/25/2018 08/19/2018  WBC 4.0 - 10.5 K/uL 9.4 13.0(H) 11.7(H)  Hemoglobin 13.0 - 17.0 g/dL 11.5(L) 12.3(L) 11.7(L)  Hematocrit 39.0 - 52.0 % 36.3(L) 37.0(L) 34.5(L)  Platelets 150 - 400 K/uL 225 205 104(L)    CBG: No results for input(s): GLUCAP in the last 168 hours.  Brief HPI:   James Olsen is a 72 year old left-handed male with history of OA, GERD, macular degeneration, progressive numbness bilateral lower extremity since early this year who was found to have a mass on his head consistent with extracranial and intracranial left frontal meningioma.  He was admitted on 08/18/2018 for craniotomy with tumor extension by Dr. Christella Noa.  Postop he developed weakness on the right as well as  expressive aphasia with MRI brain revealing left frontal brain infarct with slow slight herniation left middle frontal gyrus through cranial defect.  He was started on Decadron which is in process of being weaned off.  Patient with resultant right-sided weakness with expressive aphasia affecting overall functional status.  CIR recommended for follow-up therapy   Hospital Course: James Olsen was admitted to rehab 08/25/2018 for inpatient therapies to consist of PT, ST and OT at least three hours five days a week. Past admission physiatrist, therapy team and rehab RN have worked together to provide customized collaborative inpatient rehab.  Blood pressures were monitored on bid basis and have been controlled without medications. He reported worsening of GERD therefore Pepcid was changed to Protonix with good results. Crani incision is C/D/I and has been healing well without signs or symptoms of infection.    Serial CBC showed that thrombocytopenia and reactive leucocytosis have resolved.  Hypokalemia noted at admission has resolved with supplementation.  Mood is stable and he has been seizure free on Keppra. Baclofen was added to help manage extensor tone RLE  spasicity and titrated upwards without side effects. He has made steady progress during his rehab stay and is at supervision level. He will continue to receive follow up Outpatient PT, OT and ST at [facility] after discharge.    Rehab course: During patient's stay in rehab weekly team conferences were held to monitor patient's progress, set goals and discuss barriers to discharge. At admission, patient required mod assist with basic self care tasks and total assist with mobility. He exhibited mild aphasia with high-level word  finding deficits at conversation level and required extra time and supervision will to self-correct.  Reading comprehension and written expression appeared to be [facility].  He  has had improvement in activity tolerance, balance,  postural control as well as ability to compensate for deficits. He has had improvement in functional use RUE  and RLE as well as improvement in awareness. He is able to complete ADL tasks with supervision. He requires supervision for transfers and to ambulate 150' with verbal cues for safety.  He is able to utilize compensatory strategies for word finding for complex conversation and for complex comprehension. Family education was completed regarding all aspects of care and safety.     Disposition:  Home   Diet:  Regular    Special Instructions: 1. No alcohol, no strenuous activity and No driving.    Discharge Instructions    Ambulatory referral to Physical Medicine Rehab   Complete by:  As directed    1-2 weeks transitional care appt.     Allergies as of 09/04/2018   No Known Allergies     Medication List    STOP taking these medications   Fish Oil 1200 MG Caps   Red Yeast Rice 600 MG Caps     TAKE these medications   acetaminophen 500 MG tablet Commonly known as:  TYLENOL Take 500 mg by mouth daily as needed for moderate pain or headache.   baclofen 10 MG tablet Commonly known as:  LIORESAL Take 1 tablet (10 mg total) by mouth 2 (two) times daily.   levETIRAcetam 500 MG tablet Commonly known as:  KEPPRA Take 1 tablet (500 mg total) by mouth 2 (two) times daily.   methocarbamol 500 MG tablet Commonly known as:  ROBAXIN Take 1 tablet (500 mg total) by mouth every 6 (six) hours as needed for muscle spasms.   PRESERVISION AREDS 2 Caps Take 1 tablet by mouth 2 (two) times daily.   ranitidine 150 MG tablet Commonly known as:  ZANTAC Take 150 mg by mouth daily as needed for heartburn.      Follow-up Information    Meredith Staggers, MD Follow up.   Specialty:  Physical Medicine and Rehabilitation Contact information: [address] [place] [address] [phone]        [name], MD Follow up.   Specialty:  Neurosurgery Contact  information: [address] [place] [address] [phone]        [name], MD Follow up on 09/06/2018.   Specialty:  Family Medicine Why:  Appointment @ 1:40 PM Contact information: [address] [place] [address] [phone]           Signed: Bary Leriche 09/06/2018, 1:33 PM

## 2018-08-31 NOTE — Patient Care Conference (Signed)
Inpatient RehabilitationTeam Conference and Plan of Care Update Date: 08/31/2018   Time: 2:20 PM    Patient Name: James Olsen      Medical Record Number: [id number]  Date of Birth: 06/10/1946 Sex: Male         Room/Bed: 0J50K/9F81W-29 Payor Info: Payor: MEDICARE / Plan: MEDICARE PART A AND B / Product Type: *No Product type* /    Admitting Diagnosis: L [place] L CVA  Admit Date/Time:  08/25/2018 12:32 AM Admission Comments: No comment available   Primary Diagnosis:  <principal problem not specified> Principal Problem: <principal problem not specified>  Patient Active Problem List   Diagnosis Date Noted  . 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 Acute cerebral infarction ([place])   . Thrombocytopenia ([place])   . 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 ([place])   . Acute blood loss anemia   . Generalized OA   . Gastroesophageal reflux disease   . Seizure prophylaxis   . Leukocytosis   . Tumor 08/18/2018  . 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 MACULAR DEGENERATION 02/29/2008  . ALLERGY 02/29/2008  . Nonspecific (abnormal) findings on radiological and other examination of body structure 12/26/2007  . NONSPECIFIC ABNORM FIND RAD&OTH EXAM LUNG FIELD 12/26/2007  . GALLSTONES 10/01/2007  . ELEVATION, TRANSAMINASE/LDH LEVELS 09/06/2007  . HEPATITIS NOS 09/01/2007  . ABDOMINAL PAIN, EPIGASTRIC 09/01/2007    Expected Discharge Date: Expected Discharge Date: 09/04/18  Team Members Present: Physician leading conference: Dr. Alger Simons Social Worker Present: Ovidio Kin, LCSW Nurse Present: Dorthula Nettles, RN PT Present: Roderic Ovens, PT OT Present: Other (comment)(Sandra Davis-OT) SLP Present: Weston Anna, SLP PPS Coordinator present : Ileana Ladd, PT     Current Status/Progress Goal Weekly Team Focus  Medical   mengioma with resection, post-operative left frontal stroke, aphasia, right hemiparesis  maximize  functional activities  see medical progress note   Bowel/Bladder   Continent of bowel and bladder; LBM 08/30/18  Remain continent  Assess bowel and bladder needs q shift and PRN    Swallow/Nutrition/ Hydration             ADL's   Min/CGA for bathing/dressing   Supervision overall  R UE NMR, ADL transfers, AE/AD during ADLs   Mobility   min A gait without device, close supervision for transfers  supervision overall  NMR, gait, family ed   Communication   Supervision-Mod I  Mod I  word-finding strategies, self-monitoring and correcting errors    Safety/Cognition/ Behavioral Observations            Pain   No complaints of pain  Pain </= 2/10  Assess pain q shift and PRN and medicate as needed   Skin   No skin issues  Remain free of infection and breakdown  Assess skin q shift and PRN      *See Care Plan and progress notes for long and short-term goals.     Barriers to Discharge  Current Status/Progress Possible Resolutions Date Resolved   Physician    Medical stability               Nursing                  PT                    OT                  SLP  SW                Discharge Planning/Teaching Needs:  Home with wife who can provide 24hr assist. Will need family educaiton prior to going home.      Team Discussion:  Goals supervision overall. Trial of baclofen MD has increased today for tone. Ordering right AFO for leg stability. Needs cueing for bathing and dressing. Going on outing tomorrow. Wife having railings installed. Team recommending OP therapies.   Revisions to Treatment Plan:  DC 9/14    Continued Need for Acute Rehabilitation Level of Care: The patient requires daily medical management by a physician with specialized training in physical medicine and rehabilitation for the following conditions: Daily direction of a multidisciplinary physical rehabilitation program to ensure safe treatment while eliciting the highest outcome that is of  practical value to the patient.: Yes Daily medical management of patient stability for increased activity during participation in an intensive rehabilitation regime.: Yes Daily analysis of laboratory values and/or radiology reports with any subsequent need for medication adjustment of medical intervention for : Neurological problems;Post surgical problems   I attest that I was present, lead the team conference, and concur with the assessment and plan of the team.   Elease Hashimoto 08/31/2018, 3:19 PM

## 2018-08-31 NOTE — Progress Notes (Signed)
Physical Therapy Weekly Progress Note  Patient Details  Name: James Olsen MRN: [id number] Date of Birth: 25-Dec-1945  Beginning of progress report period: August 24, 2018 End of progress report period: August 31, 2018  Patient has met 5 of 5 short term goals.  Pt improving strength, balance, gait and mobility and is currently min A for gait and transfers, supervision for w/c propulsion with hemi technique.  Patient continues to demonstrate the following deficits muscle weakness, abnormal tone, unbalanced muscle activation and decreased coordination and decreased standing balance, decreased postural control, hemiplegia and decreased balance strategies and therefore will continue to benefit from skilled PT intervention to increase functional independence with mobility.  Patient progressing toward long term goals..  Continue plan of care.  PT Short Term Goals Week 1:  PT Short Term Goal 1 (Week 1): pt will roll L with moderate assist on flat mat without rail PT Short Term Goal 1 - Progress (Week 1): Met PT Short Term Goal 2 (Week 1): pt will transfer bed>< wc with min assist to L and R PT Short Term Goal 2 - Progress (Week 1): Met PT Short Term Goal 3 (Week 1): pt will propel w/c x 150' with supervision PT Short Term Goal 3 - Progress (Week 1): Met PT Short Term Goal 4 (Week 1): pt will perform gait x 100' with LRAD with mod assist PT Short Term Goal 4 - Progress (Week 1): Met PT Short Term Goal 5 (Week 1): pt will ascend/descend 4 6" high steps with L rail with min assist PT Short Term Goal 5 - Progress (Week 1): Met Week 2:  PT Short Term Goal 1 (Week 2): = LTG  Skilled Therapeutic Interventions/Progress Updates:  Ambulation/gait training;Community reintegration;DME/adaptive equipment instruction;Neuromuscular re-education;Psychosocial support;Stair training;UE/LE Strength taining/ROM;Wheelchair propulsion/positioning;Balance/vestibular training;Discharge planning;Functional  electrical stimulation;Pain management;Therapeutic Activities;UE/LE Coordination activities;Cognitive remediation/compensation;Functional mobility training;Patient/family education;Splinting/orthotics;Therapeutic Exercise     [name] 08/31/2018, 8:10 AM

## 2018-08-31 NOTE — Progress Notes (Signed)
Physical Therapy Session Note  Patient Details  Name: James Olsen MRN: [id number] Date of Birth: 11/13/1946  Today's Date: 08/31/2018 PT Individual Time: 1400-1445 PT Individual Time Calculation (min): 45 min   Short Term Goals: Week 2:  PT Short Term Goal 1 (Week 2): = LTG  Skilled Therapeutic Interventions/Progress Updates:    Patient received up in [facility], pleasant and willing to participate in PT session this afternoon. Focused on gait training with cues for upright posture and gaze, Mod VC and feedback provided for clearing R foot instead of dragging through during swing phase. Main focus of session on balance based tasks and NMR based activities such as backwards and sideways walking with good control and clearance of R foot, stepping over cones forwards and sideways with cues and facilitation provided at Mod level, and tapping edge of step to promote further activation of R LE musculature and clearance of foot during dynamic tasks. Able to ride Nustep for 10 minutes at level 5 with UEs and LEs for LE strength and reciprocal motion. He was very fatigued at EOS and was left sitting up in his [facility] with wife present, all other needs met this afternoon.   Therapy Documentation Precautions:  Precautions Precautions: Fall Required Braces or Orthoses: Other Brace/Splint Other Brace/Splint: Resting hand splint for R UE Restrictions Weight Bearing Restrictions: No General:   Pain: Pain Assessment Pain Scale: 0-10 Pain Score: 0-No pain Faces Pain Scale: No hurt   See Function Navigator for Current Functional Status.   Therapy/Group: Individual Therapy   Deniece Ree PT, DPT, CBIS  Supplemental Physical Therapist [facility]    Pager [phone] Acute Rehab Office [phone]    08/31/2018, 3:52 PM

## 2018-08-31 NOTE — Progress Notes (Signed)
Physical Therapy Session Note  Patient Details  Name: ASHLAND WISEMAN MRN: [id number] Date of Birth: 1946-05-05  Today's Date: 08/31/2018 PT Individual Time: 0830-0925 PT Individual Time Calculation (min): 55 min   Short Term Goals: Week 2:  PT Short Term Goal 1 (Week 2): = LTG  Skilled Therapeutic Interventions/Progress Updates:  Pt performs gait throughout unit with Rt foot up brace initially with min A, fading to supervision throughout session.  Pt performs standing balance with ball kick and ball tap with min A for balance reactions.  Gait with ball tapping walking forward and backward with min A for balance reactions.  Floor transfer with pt able to perform with supervision and cuing.  Educated pt on fall safety and what to do in case of a fall at home.  Pt left in bed with needs at hand, alarm set.  Therapy Documentation Precautions:  Precautions Precautions: Fall Required Braces or Orthoses: Other Brace/Splint Other Brace/Splint: Resting hand splint for R UE Restrictions Weight Bearing Restrictions: No Pain:  no c/o pain   Therapy/Group: Individual Therapy  Porfiria Heinrich 08/31/2018, 9:27 AM

## 2018-08-31 NOTE — Progress Notes (Signed)
Orthopedic Tech Progress Note Patient Details:  James Olsen 1946/04/18 [id number]  Patient ID: [name], male   DOB: [birth year], 72 y.o.   MRN: [id number]   Hildred Priest 08/31/2018, 4:08 PM Called in hanger brace order; spoke with Sharyn Lull

## 2018-08-31 NOTE — Progress Notes (Signed)
Occupational Therapy Session Note  Patient Details  Name: James Olsen MRN: [id number] Date of Birth: [birth year]  Today's Date: 08/31/2018 OT Individual Time: 1030-1130 OT Individual Time Calculation (min): 60 min    Short Term Goals: Week 1:  OT Short Term Goal 1 (Week 1): Pt will require no more than min A to stand and perform clothing managment OT Short Term Goal 2 (Week 1): Pt will require no more than 1 vc for visual attention to R UE OT Short Term Goal 3 (Week 1): Pt will complete toilet transfer with min A OT Short Term Goal 4 (Week 1): Pt will thread B LE through pants with min A  Skilled Therapeutic Interventions/Progress Updates:    Pt received sitting up in w/c agreeable to therapy with no c/o pain. Pt completed sink bath with set up. Min questioning cues provided during LB dressing. Pt then completed 200 ft of functional mobility at (S) level without AD down to therapy gym. Pt required min A to assume quadriped position on mat. Verbal and tactile cues provided for rocking anterior/posterior to increase weight bearing through R UE. Manual facilitation provided at elbow to increase elbow extension. Pt then transitioned onto elbows and completed core activation activity, reaching forward with L UE with full weight bearing through R UE. Pt then transitioned prone and completed spinal extension lifts, with manual facilitation provided for lifting R UE at the shoulder. Pt completed functional mobility back to room and was left sitting up in w/c with all needs met.   Therapy Documentation Precautions:  Precautions Precautions: Fall Required Braces or Orthoses: Other Brace/Splint Other Brace/Splint: Resting hand splint for R UE Restrictions Weight Bearing Restrictions: No  Pain:  None reported throughout session ADL: ADL ADL Comments: See functional navigator See Function Navigator for Current Functional Status.   Therapy/Group: Individual Therapy  [name] 08/31/2018, 12:06 PM

## 2018-08-31 NOTE — Progress Notes (Signed)
Speech Language Pathology Weekly Progress and Session Note  Patient Details  Name: James Olsen MRN: [id number] Date of Birth: 12-19-1946  Beginning of progress report period: August 25, 2018 End of progress report period: August 31, 2018  Today's Date: 08/31/2018 SLP Individual Time: 1130-1200 SLP Individual Time Calculation (min): 30 min  Short Term Goals: Week 1: SLP Short Term Goal 1 (Week 1): Patient will self-monitor and correct verbal errors with Mod I.  SLP Short Term Goal 1 - Progress (Week 1): Met SLP Short Term Goal 2 (Week 1): Patient will utilize word-finding strategies at the conversation level with supervision verbal cues.  SLP Short Term Goal 2 - Progress (Week 1): Met SLP Short Term Goal 3 (Week 1): Patient will utilize appropriate grammatical form with use of function words at the conversation level with supervision verbal cues.  SLP Short Term Goal 3 - Progress (Week 1): Met    New Short Term Goals: Week 2: SLP Short Term Goal 1 (Week 2): STGs=LTGs  Weekly Progress Updates: Patient has mad excellent gains and has met 3 of 3 STG's this reporting period. Currently, patient is overall supervision-Mod I for word-finding and to self-monitor and correct errors at the conversation level. Patient is also overall Mod I for complex comprehension of abstract information. Patient and family education ongoing. Patient would benefit from continued skilled SLP intervention to maximize his communication and overall functional independence prior to discharge.      Intensity: Minumum of 1-2 x/day, 30 to 90 minutes Frequency: 3 to 5 out of 7 days Duration/Length of Stay: 9/14 Treatment/Interventions: Internal/external aids;Speech/Language facilitation;Therapeutic Activities;Patient/family education;Functional tasks;Cueing hierarchy   Daily Session  Skilled Therapeutic Interventions: Skilled treatment session focused on language goals. Patient was Mod I for word-finding  during an abstract novel word-finding task and was Mod I to self-monitor and correct errors. However, patient required more than a reasonable amount of time to complete tasks. Patient left upright in wheelchair with all needs within reach and alarm on. Continue with current plan of care.      Function:    Cognition Comprehension Comprehension assist level: Understands complex 90% of the time/cues 10% of the time  Expression   Expression assist level: Expresses complex 90% of the time/cues < 10% of the time  Social Interaction Social Interaction assist level: Interacts appropriately with others with medication or extra time (anti-anxiety, antidepressant).  Problem Solving Problem solving assist level: Solves complex problems: With extra time  Memory Memory assist level: Complete Independence: No helper   Pain No/Denies Pain   Therapy/Group: Individual Therapy  [name] 08/31/2018, 2:56 PM

## 2018-08-31 NOTE — Evaluation (Signed)
Recreational Therapy Assessment and Plan  Patient Details  Name: James Olsen MRN: [id number] Date of Birth: 1946-08-21 Today's Date: 08/31/2018  Rehab Potential: Good ELOS: 9/14  Assessment  Problem List:      Patient Active Problem List   Diagnosis Date Noted  . 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 Acute cerebral infarction ([place])   . Thrombocytopenia ([place])   . 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 ([place])   . Acute blood loss anemia   . Generalized OA   . Gastroesophageal reflux disease   . Seizure prophylaxis   . Leukocytosis   . Tumor 08/18/2018  . 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 MACULAR DEGENERATION 02/29/2008  . ALLERGY 02/29/2008  . Nonspecific (abnormal) findings on radiological and other examination of body structure 12/26/2007  . NONSPECIFIC ABNORM FIND RAD&OTH EXAM LUNG FIELD 12/26/2007  . GALLSTONES 10/01/2007  . ELEVATION, TRANSAMINASE/LDH LEVELS 09/06/2007  . HEPATITIS NOS 09/01/2007  . ABDOMINAL PAIN, EPIGASTRIC 09/01/2007    Past Medical History:      Past Medical History:  Diagnosis Date  . Arthritis   . GERD (gastroesophageal reflux disease)   . Glaucoma   . Macular degeneration   . Wears glasses    Past Surgical History:       Past Surgical History:  Procedure Laterality Date  . CHOLECYSTECTOMY  2008   lap choli  . COLONOSCOPY    . CRANIOTOMY N/A 08/18/2018   Procedure: CRANIOTOMY TUMOR EXCISION;  Surgeon: [name], MD;  Location: [facility];  Service: Neurosurgery;  Laterality: N/A;  CRANIOTOMY TUMOR EXCISION  . NAILBED REPAIR Left 05/04/2014   Procedure: LEFT INDEX NAIL ABLATION V-Y FLAP COVERAGE;  Surgeon: [name]., MD;  Location: [facility];  Service: Orthopedics;  Laterality: Left;  index  . PILONIDAL CYST EXCISION     x2  . TONSILLECTOMY    . VASECTOMY      Assessment & Plan Clinical Impression: James Olsen is a 72 year old left-handed male with history of OA, GERD, macular degeneration, progressive numbness bilateral lower extremity since earlier this year who was found to have a mass on his head consistent with extracranial and intracranial left frontal meningioma. History taken from chart review and patient. He was admitted on 08/18/2018 for cranial with tumor excision by Dr. Christella Noa. Postop had weakness on right as well as expressive aphasia. MRI brain reviewed, showing left frontal brain infarct. Per report, left infarct with slight herniation left middle frontal gyrus through cranial defect and stable mass-effect on left superior frontal lobe. Started on decadron wean on 9/3. Patient with resultant right-sided weakness with expressive aphasia affecting overall functional status. CIR recommended for follow-up therapy. Pt admitted to [facility] 08/24/18.  Pt presents with decreased activity tolerance, decreased functional mobility, decreased balance, decreased coordination, right inattention, aphasia Limiting pt's independence with leisure/community pursuits.  Plan Min 1 TR session >60 minutes for community reintegration during LOS  Met with pt to discuss purpose of an outing and potential goals.  Pt is agreeable to participate in an outing to be scheduled tomorrow.  Recommendations for other services: None     Discharge Criteria: Patient will be discharged from TR if patient refuses treatment 3 consecutive times without medical reason.  If treatment goals not met, if there is a change in medical status, if patient makes no progress towards goals or if patient is discharged from hospital.  The above assessment, treatment plan, treatment  alternatives and goals were discussed and mutually agreed upon: by patient  [place] 08/31/2018, 4:01 PM

## 2018-08-31 NOTE — Progress Notes (Signed)
Social Work Patient ID: James Olsen, male   DOB: 10-05-1946, 72 y.o.   MRN: [id number]  Met with pt and wife to discuss team conference goals supervision level and discharge date 9/14-Sat. Both feel he is doing well and somewhat surprised he is going home so soon. Discussed OP therapies and both are agreeable to this. Will see how outing goes tomorrow. Have encouraged wife to attend therapies with pt but she is ill with bronchitis and trying to stay away from the hospital. Work on discharge needs.

## 2018-09-01 ENCOUNTER — Inpatient Hospital Stay (HOSPITAL_COMMUNITY): Payer: Medicare Other | Admitting: Speech Pathology

## 2018-09-01 ENCOUNTER — Inpatient Hospital Stay (HOSPITAL_COMMUNITY): Payer: Medicare Other

## 2018-09-01 ENCOUNTER — Encounter (HOSPITAL_COMMUNITY): Payer: Medicare Other | Admitting: *Deleted

## 2018-09-01 ENCOUNTER — Encounter (HOSPITAL_COMMUNITY): Payer: Medicare Other | Admitting: Psychology

## 2018-09-01 NOTE — Consult Note (Signed)
Neuropsychological Consultation   Patient:   James Olsen   DOB:   1946/08/29  MR Number:  [id number]  Location:  [facility] [place] [id number] [place] [address] Dept: [place]: [phone]           Date of Service:   09/01/2018  Start Time:   3 PM End Time:   4 PM  Provider/Observer:  Ilean Skill, Psy.D.       Clinical Neuropsychologist       Billing Code/Service: [phone] 4 Units  Chief Complaint:    James Olsen is a 71 year old male with a history of OA, GERD, macular degeneration, progressive numbness bilateral lower extremity since earlier this year.  The patient was found to have a mass in his head consistent with extracranial and intracranial left frontal meningioma.  He was admitted on 08/18/2018 for craniotomy with tumor excision by Dr. Christella Noa.  The patient developed postoperative weakness on the right side as well as expressive aphasia.  MRI showed left frontal brain infarct.  Left infarct with slight herniation left middle frontal gyrus through cranial defect and stable mass-effect on the left superior frontal lobe.  The patient continued with resulting right-sided weakness with expressive aphasia affecting overall functional status.  The patient has been receiving ongoing treatment through the comprehensive inpatient rehabilitation program.  Reason for Service:  The patient was referred for neuropsychological consultation due to coping and adjustment issues following a left frontal infarct that followed craniotomy with tumor excision.  Below is the HPI for the current admission.  HPI: James Olsen is a 72 year old left-handed male with history of OA, GERD, macular degeneration, progressive numbness bilateral lower extremity since earlier this year who was found to have a mass on his head consistent with extracranial and intracranial left frontal meningioma.   History taken from chart review and patient. He was admitted on 08/18/2018 for cranial with tumor excision by Dr. Christella Noa.  Postop had weakness on right as well as expressive aphasia.  MRI brain reviewed, showing left frontal brain infarct. Per report, left infarct with slight herniation left middle frontal gyrus through cranial defect and stable mass-effect on left superior frontal lobe.  Started on decadron wean on 9/3. Patient with resultant right-sided weakness with expressive aphasia affecting overall functional status.  CIR recommended for follow-up therapy  Current Status:  The patient reports that he has had difficulty with expressive language issues including word finding difficulties and fluency.  However, while there were some circumlocutions and paraphasic errors displayed today the patient was able to effectively communicate and express his ideas.  The patient did report that he was not aware that he had had a stroke but knew about the surgery itself.  The patient was well oriented today but did show some issues with recall of learned information as well as expressive language deficits related to word finding deficits.  The patient denied any significant emotional distress related to depression or anxiety types of symptoms.  The patient has significant motor deficits in his right hand and ongoing deficits but improving motor deficits in his right leg.  Behavioral Observation: James Olsen  presents as a 72 y.o.-year-old Left Caucasian Male who appeared his stated age. his dress was Appropriate and he was Well Groomed and his manners were Appropriate to the situation.  his participation was indicative of Appropriate behaviors.  There were any physical disabilities noted.  he displayed an appropriate  level of cooperation and motivation.     Interactions:    Active Appropriate  Attention:   within normal limits and attention span and concentration were age appropriate  Memory:   abnormal;  remote memory intact, recent memory impaired  Visuo-spatial:  not examined  Speech (Volume):  normal  Speech:   non-fluent aphasia; non-fluent aphasia  Thought Process:  Coherent and Relevant  Though Content:  WNL; not suicidal and not homicidal  Orientation:   person, place, time/date and situation  Judgment:   Fair  Planning:   Fair  Affect:    Flat  Mood:    Euthymic  Insight:   Fair  Intelligence:   high   Medical History:   Past Medical History:  Diagnosis Date  . Arthritis   . GERD (gastroesophageal reflux disease)   . Glaucoma   . Macular degeneration   . Wears glasses     Psychiatric History:  The patient denies any prior history of depression or anxiety types of difficulties or other psychiatric issues.  Family Med/Psych History:  Family History  Problem Relation Age of Onset  . Diabetes Mother   . Skin cancer Brother   . Congenital heart disease Brother     Risk of Suicide/Violence: low the patient denies any suicidal ideation or homicidal ideation.  Impression/DX:  James Olsen is a 72 year old male with a history of OA, GERD, macular degeneration, progressive numbness bilateral lower extremity since earlier this year.  The patient was found to have a mass in his head consistent with extracranial and intracranial left frontal meningioma.  He was admitted on 08/18/2018 for craniotomy with tumor excision by Dr. Christella Noa.  The patient developed postoperative weakness on the right side as well as expressive aphasia.  MRI showed left frontal brain infarct.  Left infarct with slight herniation left middle frontal gyrus through cranial defect and stable mass-effect on the left superior frontal lobe.  The patient continued with resulting right-sided weakness with expressive aphasia affecting overall functional status.  The patient has been receiving ongoing treatment through the comprehensive inpatient rehabilitation program.  The patient reports that he has  had difficulty with expressive language issues including word finding difficulties and fluency.  However, while there were some circumlocutions and paraphasic errors displayed today the patient was able to effectively communicate and express his ideas.  The patient did report that he was not aware that he had had a stroke but knew about the surgery itself.  The patient was well oriented today but did show some issues with recall of learned information as well as expressive language deficits related to word finding deficits.  The patient denied any significant emotional distress related to depression or anxiety types of symptoms.  The patient has significant motor deficits in his right hand and ongoing deficits but improving motor deficits in his right leg.  Diagnosis:    Acute cerebral infarction [facility]) - Plan: Ambulatory referral to Physical Medicine Rehab  Meningioma determined by biopsy of brain [facility]) - Plan: Ambulatory referral to Physical Medicine Rehab         Electronically Signed   _______________________ Ilean Skill, Psy.D.

## 2018-09-01 NOTE — Progress Notes (Signed)
Occupational Therapy Session Note  Patient Details  Name: James Olsen MRN: [id number] Date of Birth: 07-Sep-1946  Today's Date: 09/01/2018 OT Individual Time: 6629-4765 Session 2: 1300-1430 OT Individual Time Calculation (min): 30 min Session 2: 90 min   Short Term Goals: Week 1:  OT Short Term Goal 1 (Week 1): Pt will require no more than min A to stand and perform clothing managment OT Short Term Goal 2 (Week 1): Pt will require no more than 1 vc for visual attention to R UE OT Short Term Goal 3 (Week 1): Pt will complete toilet transfer with min A OT Short Term Goal 4 (Week 1): Pt will thread B LE through pants with min A  Skilled Therapeutic Interventions/Progress Updates:    Pt received sitting up in w/c with c/o abdominal pain d/t constipation. Pt completed 100 ft of functional mobility in attempt to increase circulation/movement in intestinal tract to alleviate consitpation. Pt returned supine and was guided through abdominal massage. Pt reported this eased abdominal pain. Pt was left sitting up in chair with all needs met.   Session 2: Community outing with recreational therapist took place to [employer]. During ride, goals for session were reviewed, as well as discussion re potential obstacles and barriers in the community. Pt completed all functional mobility throughout session at (S) level. Edu and instruction provided throughout re energy conservation techniques, maximizing safety in the community, and strategically planning rest breaks for fall prevention. Pt demo excellent self awareness and insight into strengths and weaknesses during outing with prompting. Pt reported pain in R shoulder following outing d/t pt letting arm hang for majority. Edu provided re options for supporting arm during extended ambulation including indications for a sling and supporting on a cart. Pt returned to the hospital and was left sitting in w/c in room with all needs met. Goal sheet put in  shadow chart at nursing station.    Therapy Documentation Precautions:  Precautions Precautions: Fall Required Braces or Orthoses: Other Brace/Splint Other Brace/Splint: Resting hand splint for R UE Restrictions Weight Bearing Restrictions: No Pain: Pain Assessment Pain Scale: 0-10 Pain Score: 4  Pain Type: Acute pain Pain Location: Abdomen Pain Orientation: Mid;Lower Pain Descriptors / Indicators: Aching Pain Onset: On-going Pain Intervention(s): Ambulation/increased activity;Therapeutic touch ADL: ADL ADL Comments: See functional navigator  See Function Navigator for Current Functional Status.   Therapy/Group: Individual Therapy  [name] 09/01/2018, 9:52 AM

## 2018-09-01 NOTE — Progress Notes (Signed)
Speech Language Pathology Daily Session Note  Patient Details  Name: James Olsen MRN: [id number] Date of Birth: 24-Dec-1945  Today's Date: 09/01/2018 SLP Individual Time: 1030-1100 SLP Individual Time Calculation (min): 30 min  Short Term Goals: Week 2: SLP Short Term Goal 1 (Week 2): STGs=LTGs  Skilled Therapeutic Interventions: Skilled treatment session focused on cognitive-linguistic goals. SLP facilitated session by providing extra time and supervision verbal cues to generate a list of ingredients needed to make a familiar recipe in the ADL kitchen and supervision-Min A verbal cues for verbal sequencing and word-finding at the conversation level while describing how to make this specific recipe. Patient left upright in wheelchair with wife present. Continue with current plan of care.      Function:   Cognition Comprehension Comprehension assist level: Understands complex 90% of the time/cues 10% of the time  Expression   Expression assist level: Expresses complex 90% of the time/cues < 10% of the time  Social Interaction Social Interaction assist level: Interacts appropriately with others with medication or extra time (anti-anxiety, antidepressant).  Problem Solving Problem solving assist level: Solves complex problems: With extra time  Memory Memory assist level: Complete Independence: No helper    Pain No/Denies Pain   Therapy/Group: Individual Therapy  Sarah-Jane Nazario 09/01/2018, 3:07 PM

## 2018-09-01 NOTE — Progress Notes (Signed)
Recreational Therapy Session Note  Patient Details  Name: James Olsen MRN: [id number] Date of Birth: 1946-11-12 Today's Date: 09/01/2018 Time:1300-1430  Pain: c/o L shoulder pain during outing, pain reduced when arm supported and rest given Skilled Therapeutic Interventions/Progress Updates: Pt participated in community reintegration/outing to [employer] at overall supervision ambulatory level without AD.    Goals focused on safe community mobility, identification & negotiation of obstacles, safety awareness, communication with unfamiliar partners, accessing public restroom, & energy conservation techniques/education  See outing goal sheet in shadow chart for full details.   Therapy/Group: [employer]  Cassanda Walmer 09/01/2018, 3:08 PM

## 2018-09-01 NOTE — Progress Notes (Signed)
Physical Therapy Session Note  Patient Details  Name: James Olsen MRN: [id number] Date of Birth: 04/07/1946  Today's Date: 09/01/2018 PT Individual Time: 0805-0900 PT Individual Time Calculation (min): 55 min   Short Term Goals: Week 1:  PT Short Term Goal 1 (Week 1): pt will roll L with moderate assist on flat mat without rail PT Short Term Goal 1 - Progress (Week 1): Met PT Short Term Goal 2 (Week 1): pt will transfer bed>< wc with min assist to L and R PT Short Term Goal 2 - Progress (Week 1): Met PT Short Term Goal 3 (Week 1): pt will propel w/c x 150' with supervision PT Short Term Goal 3 - Progress (Week 1): Met PT Short Term Goal 4 (Week 1): pt will perform gait x 100' with LRAD with mod assist PT Short Term Goal 4 - Progress (Week 1): Met PT Short Term Goal 5 (Week 1): pt will ascend/descend 4 6" high steps with L rail with min assist PT Short Term Goal 5 - Progress (Week 1): Met   Week 2 STGS = LTGs due to LOS  Skilled Therapeutic Interventions/Progress Updates:   Pt in BR with [name], RN assisting.  With PT, pt washed his hands at sink, seated in w/c, with mod cues for locating items and attending to R hand.  Stand pivot transfer w/c >< NuStep with min guard assist.  neuromuscular re-education via forced use, multimodal cues for alternating reciprocal movements on Nustep at level 4 x 7 minutes, rated 12 on BORG scale, using R orthosis to secure R hand.  Seated RLE muscle isolation task tapping R foot onto tarfet on floor, seated, with fair accuracy.    Pt performed self ROM of R shoulder, elbow and wrist with min cues, seated in w/c.  Gait training without AD x 100' with min guard assist, r hand in pocket of shorts by PT, wearing R Foot UP brace on shoe.  Simulated car transfer with extra time, mod cues and 2 attemtps to get RLe into car, superviison.  Gait x 100', with mod assist needed for 2 instances for R foot not clearing floor , otherwise min guard assist.   Up/down 4 steps L rail with min assist, cues for sequencing.  Pt had mild difficulty clearing R foot ascending and descending.  Toilet transfer to regular toilet in room, min guard assist for balance as pt managed clothing.  Small amount of stool in underwear.   Pt requested sitting on toilet to see if he can have a BM.  Pt left sitting on toilet and will use call bell when he is finished.  PT infomred [name], RN and [name], [place] of pt's status.   Therapy Documentation Precautions:  Precautions Precautions: Fall Required Braces or Orthoses: Other Brace/Splint Other Brace/Splint: Resting hand splint for R UE Restrictions Weight Bearing Restrictions: No   Pain: interittent 5/10 abdominal pain; pt believes this is due to his bowels     See Function Navigator for Current Functional Status.   Therapy/Group: Individual Therapy  [name] 09/01/2018, 12:27 PM

## 2018-09-01 NOTE — Progress Notes (Signed)
[place] PHYSICAL MEDICINE & REHABILITATION     PROGRESS NOTE    Subjective/Complaints: Pt up in bathroom. No new complaints. Pleased with progress  ROS: Patient denies fever, rash, sore throat, blurred vision, nausea, vomiting, diarrhea, cough, shortness of breath or chest pain, joint or back pain, headache, or mood change.       Objective:  No results found. Recent Labs    08/30/18 0524  WBC 9.4  HGB 11.5*  HCT 36.3*  PLT 225   Recent Labs    08/30/18 0524  NA 141  K 3.7  CL 107  GLUCOSE 116*  BUN 16  CREATININE 0.86  CALCIUM 8.5*   CBG (last 3)  No results for input(s): GLUCAP in the last 72 hours.  Wt Readings from Last 3 Encounters:  09/01/18 91.2 kg  08/18/18 90.4 kg  08/10/18 90.4 kg     Intake/Output Summary (Last 24 hours) at 09/01/2018 0841 Last data filed at 09/01/2018 0400 Gross per 24 hour  Intake 840 ml  Output 1400 ml  Net -560 ml    Vital Signs: Blood pressure 106/70, pulse 68, temperature 98.2 F (36.8 C), temperature source Oral, resp. rate 18, height 5\' 9"  (1.753 m), weight 91.2 kg, SpO2 99 %. Physical Exam:  Constitutional: No distress . Vital signs reviewed. HEENT: EOMI, oral membranes moist Neck: supple Cardiovascular: RRR without murmur. No JVD    Respiratory: CTA Bilaterally without wheezes or rales. Normal effort    GI: BS +, non-tender, non-distended  Musculoskeletal:  Right heel cord tight Neurological: He is alert and oriented to person, place, and time.  Language more fluent Motor:  RUE:  2/5 pec,biceps; 2/5 finger flexors--->more consistent movement RLE: HF KE 3+/5, ADF 0 to tr5 LUE/LLE: 5/5 proximal to distal Sensation intact to light touch, except b/l feet--unchanged 1-2/4 Extensor tone RLE.     Skin:  Cranial wound intact, staples out  Assessment/Plan: 1. 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 Physiatrist is providing close team supervision and 24 hour management of active medical problems listed below. Physiatrist and rehab team continue to assess barriers to discharge/monitor patient progress toward functional and medical goals.  Function:  Bathing Bathing position   Position: Shower  Bathing parts Body parts bathed by patient: Right arm, Chest, Abdomen, Front perineal area, Buttocks, Right upper leg, Left upper leg, Right lower leg, Left lower leg Body parts bathed by helper: Left arm, Back  Bathing assist Assist Level: Touching or steadying assistance(Pt > 75%)      Upper Body Dressing/Undressing Upper body dressing   What is the patient wearing?: Pull over shirt/dress     Pull over shirt/dress - Perfomed by patient: Thread/unthread left sleeve, Put head through opening, Pull shirt over trunk Pull over shirt/dress - Perfomed by helper: Thread/unthread right sleeve        Upper body assist Assist Level: Touching or steadying assistance(Pt > 75%)      Lower Body Dressing/Undressing Lower body dressing   What is the patient wearing?: Pants, Shoes, Underwear Underwear - Performed by patient: Thread/unthread right underwear leg, Thread/unthread left underwear leg, Pull underwear up/down   Pants- Performed by patient: Thread/unthread right pants leg, Thread/unthread left pants leg, Pull pants up/down Pants- Performed by helper: Thread/unthread right pants leg, Thread/unthread left pants leg         Shoes - Performed by patient: Don/doff right shoe, Don/doff left  shoe            Lower body assist Assist for lower body dressing: Touching or steadying assistance (Pt > 75%)      Toileting Toileting Toileting activity did not occur: No continent bowel/bladder event Toileting steps completed by patient: Adjust clothing prior to toileting Toileting steps completed by helper: Performs perineal hygiene, Adjust clothing after  toileting Toileting Assistive Devices: Grab bar or rail  Toileting assist Assist level: Touching or steadying assistance (Pt.75%)   Transfers Chair/bed transfer Chair/bed transfer activity did not occur: Safety/medical concerns Chair/bed transfer method: Ambulatory Chair/bed transfer assist level: Touching or steadying assistance (Pt > 75%) Chair/bed transfer assistive device: Armrests     Locomotion Ambulation     Max distance: 200 Assist level: Touching or steadying assistance (Pt > 75%)   Wheelchair   Type: Manual Max wheelchair distance: 150 ft Assist Level: Supervision or verbal cues  Cognition Comprehension Comprehension assist level: Understands complex 90% of the time/cues 10% of the time  Expression Expression assist level: Expresses complex 90% of the time/cues < 10% of the time  Social Interaction Social Interaction assist level: Interacts appropriately with others with medication or extra time (anti-anxiety, antidepressant).  Problem Solving Problem solving assist level: Solves complex problems: With extra time  Memory Memory assist level: Complete Independence: No helper  Medical Problem List and Plan: 1.  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  -continue PT, OT, SLP   -foot up AFO requested RLE 2.  DVT Prophylaxis/Anticoagulation: Pharmaceutical: Heparin 3. Pain Management: Continue hydrocodone as needed 4. Mood: LCSW to follow for evaluation and support 5. Neuropsych: This patient is capable of making decisions on his own behalf. 6. Skin/Wound Care: Monitor incision for healing.  Routine pressure relief measures. 7. Fluids/Electrolytes/Nutrition: encourage PO  -repleting potassium--up to 3/7 on 9/9 8.  GERD:  protonix 9.  Seizure prophylaxis: Keppra twice daily 10.  Leukocytosis: Likely reactive due to steroids.  resolved 11.  ABLA: 11.5 9/9 12.  Thrombocytopenia: Monitor for signs of bleeding.  recovered to 225  9/9 13. Early extensor tone RLE:   -baclofen trial:   10mg  bid with some benefit  -PRAFO, ROM     LOS (Days) 7 A FACE TO FACE EVALUATION WAS PERFORMED  Meredith Staggers, MD 09/01/2018 8:41 AM

## 2018-09-02 ENCOUNTER — Inpatient Hospital Stay (HOSPITAL_COMMUNITY): Payer: Medicare Other | Admitting: Speech Pathology

## 2018-09-02 ENCOUNTER — Inpatient Hospital Stay (HOSPITAL_COMMUNITY): Payer: Medicare Other | Admitting: Physical Therapy

## 2018-09-02 ENCOUNTER — Inpatient Hospital Stay (HOSPITAL_COMMUNITY): Payer: Medicare Other

## 2018-09-02 ENCOUNTER — Ambulatory Visit (HOSPITAL_COMMUNITY): Payer: Medicare Other | Admitting: Physical Therapy

## 2018-09-02 NOTE — Progress Notes (Signed)
Physical Therapy Session Note  Patient Details  Name: James Olsen MRN: [id number] Date of Birth: [birth year]  Today's Date: 09/02/2018 PT Individual Time: 0930-1000 PT Individual Time Calculation (min): 30 min   Short Term Goals: Week 2:  PT Short Term Goal 1 (Week 2): = LTG  Skilled Therapeutic Interventions/Progress Updates:    Pt received seated in w/c in room, agreeable to PT. No complaints of pain. Min A to don shoes and R "foot-up" brace. Sit to stand with CGA. Ambulation 2 x 150 ft with no AD and CGA to min A for balance, focus on RLE clearance with gait. Side-steps 2 x 30 ft L/R with no AD and min A, decrease in RLE clearance with onset of fatigue as well as some path deviation. Backwards ambulation 2 x 30 ft with no AD and min A, focus on maintaining upright balance with gait. Pt left seated in w/c in room with needs in reach at end of therapy session.  Therapy Documentation Precautions:  Precautions Precautions: Fall Required Braces or Orthoses: Other Brace/Splint Other Brace/Splint: Resting hand splint for R UE Restrictions Weight Bearing Restrictions: No  See Function Navigator for Current Functional Status.   Therapy/Group: Individual Therapy  Excell Seltzer, PT, DPT  09/02/2018, 11:35 AM

## 2018-09-02 NOTE — Progress Notes (Signed)
Physical Therapy Session Note  Patient Details  Name: James Olsen MRN: [id number] Date of Birth: December 16, 1946  Today's Date: 09/02/2018 PT Individual Time: 1300-1355 PT Individual Time Calculation (min): 55 min   Short Term Goals: Week 2:  PT Short Term Goal 1 (Week 2): = LTG  Skilled Therapeutic Interventions/Progress Updates:    pt performs gait throughout unit with close supervision with Rt foot up brace.  Pt performs gait with obstacle negotiation and stepping over obstacles with supervision, cues for technique and safety.  Stair negotiation x 12 stairs with 1 handrail with supervision, rest breaks due to fatigue.  Standing on foam for dynavision tasks with light speed decreasing from 3 seconds to 1.5 seconds in increments. Pt able to perform with 70% accuracy and min A for balance on foam.  Discussed pt's anxiety about return home and putting "a lot" on his wife.  Encouraged pt to talk to his wife about his feelings and to problem solve ideas for giving her a break at home.  Pt left in room with needs at hand, alarm set.  Therapy Documentation Precautions:  Precautions Precautions: Fall Required Braces or Orthoses: Other Brace/Splint Other Brace/Splint: Resting hand splint for R UE Restrictions Weight Bearing Restrictions: No Pain:  no c/o pain   Therapy/Group: Individual Therapy  Tarun Patchell 09/02/2018, 1:56 PM

## 2018-09-02 NOTE — Progress Notes (Signed)
Occupational Therapy Discharge Summary  Patient Details  Name: James Olsen MRN: [id number] Date of Birth: 08-26-1946  Today's Date: 09/03/2018 OT Individual Time: 7106-2694 OT Individual Time Calculation (min): 45 min  1:! GRAD DAY Discussed d/c recommendations concerning home safety and daily routine with pt and pt's wife. Performed self care retraining at shower level, performing all tasks at goal level - supervision with extra time for set up. Pt continues to require supervision for right UE/ LE awareness, safety with transitional movements and overall safety.    Patient has met 10 of 10 long term goals due to improved activity tolerance, improved balance, postural control, ability to compensate for deficits, functional use of  RIGHT upper extremity, improved attention, improved awareness and improved coordination.  Patient to discharge at overall Supervision level.  Patient's care partner is independent to provide the necessary physical assistance at discharge.    Reasons goals not met: All treatment goals met  Recommendation:  Patient will benefit from ongoing skilled OT services in outpatient setting to continue to advance functional skills in the area of BADL and iADL.  Equipment: No equipment provided  Reasons for discharge: treatment goals met and discharge from hospital  Patient/family agrees with progress made and goals achieved: Yes  OT Discharge Precautions/Restrictions  Precautions Other Brace/Splint: Resting hand splint for R UE General Chart Reviewed: Yes Family/Caregiver Present: Yes Pain  no c/o pain  ADL ADL ADL Comments: See functional navigator Vision Baseline Vision/History: Wears glasses Wears Glasses: At all times Patient Visual Report: No change from baseline Vision Assessment?: No apparent visual deficits Perception  Inattention/Neglect: Does not attend to left side of body Praxis Praxis-Other Comments: improved Cognition Overall  Cognitive Status: Within Functional Limits for tasks assessed Arousal/Alertness: Awake/alert Orientation Level: Oriented X4 Attention: Selective Selective Attention: Appears intact Memory: Appears intact Awareness: Appears intact Problem Solving: Appears intact Safety/Judgment: Appears intact Sensation Sensation Light Touch: Impaired Detail Light Touch Impaired Details: Impaired RUE Proprioception Impaired Details: Impaired RLE;Impaired RUE Coordination Gross Motor Movements are Fluid and Coordinated: No Fine Motor Movements are Fluid and Coordinated: No Motor  Motor Motor: Hemiplegia;Abnormal tone Motor - Discharge Observations: Rt hemiplegia Mobility  Transfers Sit to Stand: Supervision/Verbal cueing Stand to Sit: Supervision/Verbal cueing  Trunk/Postural Assessment  Cervical Assessment Cervical Assessment: Within Functional Limits Thoracic Assessment Thoracic Assessment: Within Functional Limits Lumbar Assessment Lumbar Assessment: Within Functional Limits Postural Control Postural Control: Deficits on evaluation Trunk Control: improved and aligned at midline Righting Reactions: delayed  Balance   Extremity/Trunk Assessment RUE Assessment Passive Range of Motion (PROM) Comments: WFL Active Range of Motion (AROM) Comments: Trace movement and reflex movements during cough observed in shoulder and possibly bicep  RUE Body System: Neuro Brunstrum levels for arm and hand: Hand;Arm Brunstrum level for arm: Stage I Presynergy Brunstrum level for hand: Stage I Flaccidity LUE Assessment LUE Assessment: Within Functional Limits   See Function Navigator for Current Functional Status.  [name] [facility] 09/03/2018, 2:15 PM

## 2018-09-02 NOTE — Progress Notes (Signed)
Occupational Therapy Session Note  Patient Details  Name: James Olsen MRN: [id number] Date of Birth: Jun 24, 1946  Today's Date: 09/02/2018 OT Individual Time: 1045-1200 OT Individual Time Calculation (min): 75 min    Short Term Goals: Week 1:  OT Short Term Goal 1 (Week 1): Pt will require no more than min A to stand and perform clothing managment OT Short Term Goal 2 (Week 1): Pt will require no more than 1 vc for visual attention to R UE OT Short Term Goal 3 (Week 1): Pt will complete toilet transfer with min A OT Short Term Goal 4 (Week 1): Pt will thread B LE through pants with min A  Skilled Therapeutic Interventions/Progress Updates:    Pt received sitting up in w/c agreeable to therapy. Pt transported down to kitchen to cook meal for time management. Pt completed functional mobility around kitchen with (S). Vc and edu provided re one handed techniques for opening cans, as well as AD/AE for the kitchen, I.e. Rocker knife, cutting board. Pt required questioning cues for recalling/verbalizing next step in cooking task. Pt edu throughout re energy conservation techniques in the kitchen. Vc required for R UE placement to avoid letting arm hang. Pt demo good self awareness in needing to sit for rest break. Pt problem solved through various obstacles in kitchen such as transporting pot of water to sink, adding ingredients with one hand, and positioning of commonly used items in easily accessible locations. Pt returned to his room and was left sitting up in w/c with all needs met.   Therapy Documentation Precautions:  Precautions Precautions: Fall Required Braces or Orthoses: Other Brace/Splint Other Brace/Splint: Resting hand splint for R UE Restrictions Weight Bearing Restrictions: No   Vital Signs: Therapy Vitals Temp: 98.1 F (36.7 C) Temp Source: Oral Pulse Rate: 89 Resp: 16 BP: 124/65 Patient Position (if appropriate): Sitting Oxygen Therapy SpO2: 99 % O2 Device: Room  Air Pain:  No pain reported during session ADL: ADL ADL Comments: See functional navigator  See Function Navigator for Current Functional Status.   Therapy/Group: Individual Therapy  [name] 09/02/2018, 5:02 PM

## 2018-09-02 NOTE — Progress Notes (Signed)
Speech Language Pathology Daily Session Note  Patient Details  Name: James Olsen MRN: [id number] Date of Birth: 1946/07/26  Today's Date: 09/02/2018 SLP Individual Time: 3016-0109 SLP Individual Time Calculation (min): 45 min  Short Term Goals: Week 2: SLP Short Term Goal 1 (Week 2): STGs=LTGs  Skilled Therapeutic Interventions: Skilled treatment session focused on language goals. Upon arrival, patient was awake in bed. SLP facilitated session by providing extra time for problem solving with transfer and for basic ADL tasks at the sink. SLP also facilitated session by providing extra time and supervision phonemic cues for word-finding at the conversation level during an unstructured, abstract verbal task. Patient left upright in wheelchair with all needs within reach. Continue with current plan of care.      Function:   Cognition Comprehension Comprehension assist level: Understands complex 90% of the time/cues 10% of the time  Expression   Expression assist level: Expresses complex 90% of the time/cues < 10% of the time  Social Interaction Social Interaction assist level: Interacts appropriately with others with medication or extra time (anti-anxiety, antidepressant).  Problem Solving Problem solving assist level: Solves complex problems: With extra time  Memory Memory assist level: Complete Independence: No helper    Pain No/Denies Pain   Therapy/Group: Individual Therapy  Madilynn Montante 09/02/2018, 8:36 AM

## 2018-09-02 NOTE — Progress Notes (Signed)
[place] PHYSICAL MEDICINE & REHABILITATION     PROGRESS NOTE    Subjective/Complaints: Up brushing teeth. No new issues this morning  ROS: Patient denies fever, rash, sore throat, blurred vision, nausea, vomiting, diarrhea, cough, shortness of breath or chest pain, joint or back pain, headache, or mood change.      Objective:  No results found. No results for input(s): WBC, HGB, HCT, PLT in the last 72 hours. No results for input(s): NA, K, CL, GLUCOSE, BUN, CREATININE, CALCIUM in the last 72 hours.  Invalid input(s): CO CBG (last 3)  No results for input(s): GLUCAP in the last 72 hours.  Wt Readings from Last 3 Encounters:  09/01/18 91.2 kg  08/18/18 90.4 kg  08/10/18 90.4 kg     Intake/Output Summary (Last 24 hours) at 09/02/2018 0825 Last data filed at 09/01/2018 1955 Gross per 24 hour  Intake 600 ml  Output 300 ml  Net 300 ml    Vital Signs: Blood pressure 114/68, pulse 78, temperature (!) 97.5 F (36.4 C), temperature source Oral, resp. rate 18, height 5\' 9"  (1.753 m), weight 91.2 kg, SpO2 99 %. Physical Exam:  Constitutional: No distress . Vital signs reviewed. HEENT: EOMI, oral membranes moist Neck: supple Cardiovascular: RRR without murmur. No JVD    Respiratory: CTA Bilaterally without wheezes or rales. Normal effort    GI: BS +, non-tender, non-distended  Musculoskeletal:  Right heel cord more flexible Neurological: He is alert and oriented to person, place, and time.  Language more fluent but still with breaks,apraxic Motor:  RUE:  2/5 pec,biceps; 2/5 finger flexors--->apraxic RLE: HF KE 3+/5, ADF tr/15 LUE/LLE: 5/5 proximal to distal Sensation intact to light touch, except b/l feet--unchanged 1-2/4 Extensor tone RLE.     Skin:  Cranial wound intact, staples out, scab  Assessment/Plan: 1. 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 Physiatrist is providing close team supervision and 24 hour management of active medical problems listed below. Physiatrist and rehab team continue to assess barriers to discharge/monitor patient progress toward functional and medical goals.  Function:  Bathing Bathing position   Position: Shower  Bathing parts Body parts bathed by patient: Right arm, Chest, Abdomen, Front perineal area, Buttocks, Right upper leg, Left upper leg, Right lower leg, Left lower leg Body parts bathed by helper: Left arm, Back  Bathing assist Assist Level: Touching or steadying assistance(Pt > 75%)      Upper Body Dressing/Undressing Upper body dressing   What is the patient wearing?: Pull over shirt/dress     Pull over shirt/dress - Perfomed by patient: Thread/unthread left sleeve, Put head through opening, Pull shirt over trunk Pull over shirt/dress - Perfomed by helper: Thread/unthread right sleeve        Upper body assist Assist Level: Touching or steadying assistance(Pt > 75%)      Lower Body Dressing/Undressing Lower body dressing   What is the patient wearing?: Pants, Shoes, Underwear Underwear - Performed by patient: Thread/unthread right underwear leg, Thread/unthread left underwear leg, Pull underwear up/down   Pants- Performed by patient: Thread/unthread right pants leg, Thread/unthread left pants leg, Pull pants up/down Pants- Performed by helper: Thread/unthread right pants leg, Thread/unthread left pants leg         Shoes - Performed by patient: Don/doff right shoe, Don/doff left shoe            Lower body assist Assist for lower  body dressing: Touching or steadying assistance (Pt > 75%)      Toileting Toileting Toileting activity did not occur: No continent bowel/bladder event Toileting steps completed by patient: Adjust clothing prior to toileting Toileting steps completed by helper: Adjust clothing prior to toileting, Performs perineal hygiene,  Adjust clothing after toileting Toileting Assistive Devices: Grab bar or rail  Toileting assist Assist level: Touching or steadying assistance (Pt.75%)   Transfers Chair/bed transfer Chair/bed transfer activity did not occur: Safety/medical concerns Chair/bed transfer method: Ambulatory Chair/bed transfer assist level: Touching or steadying assistance (Pt > 75%) Chair/bed transfer assistive device: Armrests     Locomotion Ambulation     Max distance: 200 Assist level: Touching or steadying assistance (Pt > 75%)   Wheelchair   Type: Manual Max wheelchair distance: 150 ft Assist Level: Supervision or verbal cues  Cognition Comprehension Comprehension assist level: Understands complex 90% of the time/cues 10% of the time  Expression Expression assist level: Expresses complex 90% of the time/cues < 10% of the time  Social Interaction Social Interaction assist level: Interacts appropriately with others with medication or extra time (anti-anxiety, antidepressant).  Problem Solving Problem solving assist level: Solves complex problems: With extra time  Memory Memory assist level: Complete Independence: No helper  Medical Problem List and Plan: 1.  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  -continue PT, OT, SLP   -foot up AFO  RLE 2.  DVT Prophylaxis/Anticoagulation: Pharmaceutical: Heparin 3. Pain Management: Continue hydrocodone as needed 4. Mood: LCSW to follow for evaluation and support 5. Neuropsych: This patient is capable of making decisions on his own behalf. 6. Skin/Wound Care: Monitor incision for healing.  Routine pressure relief measures. 7. Fluids/Electrolytes/Nutrition: encourage PO  -repleting potassium--up to 3/7 on 9/9 8.  GERD:  protonix 9.  Seizure prophylaxis: Keppra twice daily 10.  Leukocytosis: Likely reactive due to steroids.  resolved 11.  ABLA: 11.5 9/9 12.  Thrombocytopenia: Monitor for signs of bleeding.  recovered  to 225 9/9 13. Early extensor tone RLE:   -baclofen trial:   10mg  bid , tolerating well  -PRAFO, ROM     LOS (Days) 8 A FACE TO FACE EVALUATION WAS PERFORMED  Meredith Staggers, MD 09/02/2018 8:25 AM

## 2018-09-02 NOTE — Progress Notes (Signed)
Recreational Therapy Discharge Summary Patient Details  Name: KEYMARI SATO MRN: [id number] Date of Birth: 1946/07/19 Today's Date: 09/02/2018  Comments on progress toward goals: Pt has made great progress during LOS and is ready for discharge home with wife at supervision level.  TR session focused on community reintegration with emphasis on safe mobility, energy conservation techniques, problem solving through obstacles & public restroom access, conversing with unfamiliar partners.  All goals met.  See outing goal sheet in shadow chart for details.  Reasons for discharge: discharge from hospitalPatient/family agrees with progress made and goals achieved: Yes  Locke Barrell 09/02/2018, 10:15 AM

## 2018-09-03 ENCOUNTER — Inpatient Hospital Stay (HOSPITAL_COMMUNITY): Payer: Medicare Other | Admitting: Speech Pathology

## 2018-09-03 ENCOUNTER — Inpatient Hospital Stay (HOSPITAL_COMMUNITY): Payer: Medicare Other | Admitting: Physical Therapy

## 2018-09-03 ENCOUNTER — Inpatient Hospital Stay (HOSPITAL_COMMUNITY): Payer: Medicare Other | Admitting: Occupational Therapy

## 2018-09-03 MED ORDER — BACLOFEN 10 MG PO TABS: 10.0000 mg | ORAL_TABLET | Freq: Two times a day (BID) | ORAL | 0 refills | Status: DC

## 2018-09-03 MED ORDER — METHOCARBAMOL 500 MG PO TABS: 500.0000 mg | ORAL_TABLET | Freq: Four times a day (QID) | ORAL | 0 refills | Status: DC | PRN

## 2018-09-03 MED ORDER — LEVETIRACETAM 500 MG PO TABS: 500.0000 mg | ORAL_TABLET | Freq: Two times a day (BID) | ORAL | 0 refills | Status: DC

## 2018-09-03 NOTE — Progress Notes (Signed)
Speech Language Pathology Discharge Summary  Patient Details  Name: James Olsen MRN: [id number] Date of Birth: 1946-02-02  Today's Date: 09/03/2018 SLP Individual Time: 0830-0930 SLP Individual Time Calculation (min): 60 min   Skilled Therapeutic Interventions: Skilled treatment session focused on language goals. Patient decoded at the paragraph level with extra time and participated in a verbal description task the conversation level that focused on details from article. Patient completed task with Mod I but required extra time for planning and organization of information along with word-finding. Patient left upright in wheelchair with all needs within reach. Continue with current plan of care.   Patient has met 3 of 3 long term goals.  Patient to discharge at overall Modified Independent level.   Reasons goals not met: N/A   Clinical Impression/Discharge Summary: Patient has made functional gains and has met 3 of 3 LTGs this reporting period. Currently, patient is overall Mod I for word-finding, complex comprehension and complex verbal expression in regards to self-monitoring and correcting errors when given extra time. Patient education complete and patient will discharge home with assistance from family. Patient would benefit from f/u SLP services to maximize his functional communication.   Care Partner:  Caregiver Able to Provide Assistance: Yes     Recommendation:  Outpatient SLP  Rationale for SLP Follow Up: Maximize cognitive function and independence   Equipment: N/A   Reasons for discharge: Treatment goals met;Discharged from hospital   Patient/Family Agrees with Progress Made and Goals Achieved: Yes   Function:   Cognition Comprehension Comprehension assist level: Follows complex conversation/direction with extra time/assistive device  Expression   Expression assist level: Expresses complex ideas: With extra time/assistive device  Social Interaction Social  Interaction assist level: Interacts appropriately with others with medication or extra time (anti-anxiety, antidepressant).  Problem Solving Problem solving assist level: Solves complex problems: With extra time  Memory Memory assist level: Complete Independence: No helper   [name] 09/03/2018, 3:06 PM

## 2018-09-03 NOTE — Discharge Instructions (Signed)
Inpatient Rehab Discharge Instructions  HAYS DUNNIGAN Discharge date and time:  09/04/18  Activities/Precautions/ Functional Status: Activity: No lifting, driving, or strenuous exercise till cleared by MD Diet: regular diet Wound Care: keep wound clean and dry    Functional status:  ___ No restrictions     ___ Walk up steps independently _X__ 24/7 supervision/assistance   ___ Walk up steps with assistance ___ Intermittent supervision/assistance  ___ Bathe/dress independently ___ Walk with walker     _X__ Bathe/dress with assistance ___ Walk Independently    ___ Shower independently ___ Walk with assistance    ___ Shower with assistance _X__ No alcohol     ___ Return to work/school ________   Special Instructions:    COMMUNITY REFERRALS UPON DISCHARGE:      Outpatient: PT, OT, SP  Agency: [facility]MM:[phone]   Date of Last Service:09/04/2018  Appointment Date/Time:SEPTEMBER 17-Tuesday 8:15-11:00 [facility]  Medical Equipment/Items Ordered:WHEELCHAIR  Agency/Supplier:[facility]   [phone]   GENERAL COMMUNITY RESOURCES FOR PATIENT/FAMILY: Support Groups:[facility]PPORT [facility]  THE SECOND Thursday EACH MONTH @ 6:00-7:00 PM ON THE REHAB UNIT QUESTIONS CALL [phone]    My questions have been answered and I understand these instructions. I will adhere to these goals and the provided educational materials after my discharge from the hospital.  Patient/Caregiver Signature _______________________________ Date __________  Clinician Signature _______________________________________ Date __________  Please bring this form and your medication list with you to all your follow-up doctor's appointments.

## 2018-09-03 NOTE — Progress Notes (Signed)
Occupational Therapy Session Note  Patient Details  Name: James Olsen MRN: [id number] Date of Birth: [birth year]  Today's Date: 09/03/2018 OT Individual Time: 1130-1158 OT Individual Time Calculation (min): 28 min    Short Term Goals: Week 1:  OT Short Term Goal 1 (Week 1): Pt will require no more than min A to stand and perform clothing managment OT Short Term Goal 2 (Week 1): Pt will require no more than 1 vc for visual attention to R UE OT Short Term Goal 3 (Week 1): Pt will complete toilet transfer with min A OT Short Term Goal 4 (Week 1): Pt will thread B LE through pants with min A  Skilled Therapeutic Interventions/Progress Updates:     OT intervention with focus on RUE NMR to increase functional use of RUE and independence with BADLS. Pt with weak finger flexion and trace extension but improving with repetition during session.  Trace biceps noted but inadequate for elbow flexion. Pt remained seated in w/c with all needs within reach.   Therapy Documentation Precautions:  Precautions Precautions: Fall Required Braces or Orthoses: Other Brace/Splint Other Brace/Splint: Resting hand splint for R UE Restrictions Weight Bearing Restrictions: No   Pain: Pain Assessment Pain Scale: 0-10 Pain Score: 0-No pain  See Function Navigator for Current Functional Status.   Therapy/Group: Individual Therapy  Leroy Libman 09/03/2018, 12:00 PM

## 2018-09-03 NOTE — Progress Notes (Signed)
Physical Therapy Discharge Summary  Patient Details  Name: James Olsen MRN: [id number] Date of Birth: 11-23-1946  Today's Date: 09/03/2018 PT Individual Time: 3335-4562 PT Individual Time Calculation (min): 55 min   Pt performs gait throughout unit with distant supervision with Rt foot up brace improving foot clearance, no LOB in home or controlled environments. Pt performs gait on ramp/curb and mulch all with supervision.  Stair negotiation x 12 steps with 1 handrail with supervision.  Simulated car transfer with supervision, floor transfer with supervision.  Quadruped for UE wt bearing with mod manual facilitation for Rt UE wt shifts and control.  Berg balance test performed with pt scoring 46/56. Pt aware of need for assistance with stairs and uneven surfaces. Pt states he feels ready for safe d/c home tomorrow.  Patient has met 15 of 15 long term goals due to improved activity tolerance, improved balance, improved postural control, increased strength, ability to compensate for deficits and functional use of  right lower extremity.  Patient to discharge at an ambulatory level Supervision.   [name] not present to perform family education. Pt aware of his needs for supervision with gait and mobility.  Reasons goals not met: n/a  Recommendation:  Patient will benefit from ongoing skilled PT services in outpatient setting to continue to advance safe functional mobility, address ongoing impairments in gait, balance, NMR, and minimize fall risk.  Equipment: w/c  Reasons for discharge: treatment goals met and discharge from hospital  Patient/family agrees with progress made and goals achieved: Yes  PT Discharge Precautions/Restrictions Precautions Precautions: Fall Restrictions Weight Bearing Restrictions: No Pain Pain Assessment Pain Scale: 0-10 Pain Score: 0-No pain    Cognition Overall Cognitive Status: Within Functional Limits for tasks assessed Arousal/Alertness:  Awake/alert Orientation Level: Oriented X4 Sensation Sensation Light Touch: Appears Intact Proprioception Impaired Details: Impaired RLE Coordination Gross Motor Movements are Fluid and Coordinated: No Fine Motor Movements are Fluid and Coordinated: No Motor  Motor Motor: Hemiplegia;Abnormal tone Motor - Discharge Observations: Rt hemiplegia  Mobility Bed Mobility Supine to Sit: Independent with assistive device Sit to Supine: Independent with assistive device Transfers Sit to Stand: Supervision/Verbal cueing Stand to Sit: Supervision/Verbal cueing Stand Pivot Transfers: Supervision/Verbal cueing Transfer (Assistive device): None Locomotion  Gait Ambulation: Yes Gait Assistance: Supervision/Verbal cueing Gait Distance (Feet): 150 Feet Assistive device: None  Trunk/Postural Assessment  Cervical Assessment Cervical Assessment: Within Functional Limits Thoracic Assessment Thoracic Assessment: Within Functional Limits Lumbar Assessment Lumbar Assessment: Within Functional Limits Postural Control Righting Reactions: delayed  Balance Standardized Balance Assessment Standardized Balance Assessment: Berg Balance Test Berg Balance Test Sit to Stand: Able to stand without using hands and stabilize independently Standing Unsupported: Able to stand safely 2 minutes Sitting with Back Unsupported but Feet Supported on Floor or Stool: Able to sit safely and securely 2 minutes Stand to Sit: Sits safely with minimal use of hands Transfers: Able to transfer safely, minor use of hands Standing Unsupported with Eyes Closed: Able to stand 10 seconds safely Standing Ubsupported with Feet Together: Able to place feet together independently and stand for 1 minute with supervision From Standing, Reach Forward with Outstretched Arm: Can reach forward >12 cm safely (5") From Standing Position, Pick up Object from Floor: Able to pick up shoe safely and easily From Standing Position, Turn to  Look Behind Over each Shoulder: Looks behind from both sides and weight shifts well Turn 360 Degrees: Able to turn 360 degrees safely in 4 seconds or less Standing Unsupported, Alternately Place Feet  on Step/Stool: Able to complete >2 steps/needs minimal assist Standing Unsupported, One Foot in Front: Able to take small step independently and hold 30 seconds Standing on One Leg: Tries to lift leg/unable to hold 3 seconds but remains standing independently Total Score: 46 Extremity Assessment      RLE Assessment General Strength Comments: hip flex 2+/5, knee ext 3/5, knee flex 2/5, ankle DF 0/5, ankle PF 3/5 LLE Assessment LLE Assessment: Within Functional Limits   See Function Navigator for Current Functional Status.  Tyronza Happe 09/03/2018, 10:12 AM

## 2018-09-03 NOTE — Progress Notes (Signed)
[place] PHYSICAL MEDICINE & REHABILITATION     PROGRESS NOTE    Subjective/Complaints: Up brushing teeth. No new issues this morning  ROS: Patient denies fever, rash, sore throat, blurred vision, nausea, vomiting, diarrhea, cough, shortness of breath or chest pain, joint or back pain, headache, or mood change.      Objective:  No results found. No results for input(s): WBC, HGB, HCT, PLT in the last 72 hours. No results for input(s): NA, K, CL, GLUCOSE, BUN, CREATININE, CALCIUM in the last 72 hours.  Invalid input(s): CO CBG (last 3)  No results for input(s): GLUCAP in the last 72 hours.  Wt Readings from Last 3 Encounters:  09/01/18 91.2 kg  08/18/18 90.4 kg  08/10/18 90.4 kg     Intake/Output Summary (Last 24 hours) at 09/03/2018 0843 Last data filed at 09/03/2018 0622 Gross per 24 hour  Intake 480 ml  Output 850 ml  Net -370 ml    Vital Signs: Blood pressure 95/60, pulse 70, temperature 97.9 F (36.6 C), resp. rate 18, height 5\' 9"  (1.753 m), weight 91.2 kg, SpO2 98 %. Physical Exam:  Constitutional: No distress . Vital signs reviewed. HEENT: EOMI, oral membranes moist Neck: supple Cardiovascular: RRR without murmur. No JVD    Respiratory: CTA Bilaterally without wheezes or rales. Normal effort    GI: BS +, non-tender, non-distended  Musculoskeletal:  Right heel cord more flexible Neurological: He is alert and oriented to person, place, and time.  Language more fluent but still with breaks,apraxic Motor:  RUE:  2/5 pec,biceps; 2/5 finger flexors--->apraxic RLE: HF KE 3+/5, ADF tr/15 LUE/LLE: 5/5 proximal to distal Sensation intact to light touch, except b/l feet--unchanged 1-2/4 Extensor tone RLE.     Skin:  Cranial wound intact, staples out, scab  Assessment/Plan: 1. 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 Physiatrist is providing close team supervision and 24 hour management of active medical problems listed below. Physiatrist and rehab team continue to assess barriers to discharge/monitor patient progress toward functional and medical goals.  Function:  Bathing Bathing position   Position: Shower  Bathing parts Body parts bathed by patient: Right arm, Chest, Abdomen, Front perineal area, Buttocks, Right upper leg, Left upper leg, Right lower leg, Left lower leg Body parts bathed by helper: Left arm, Back  Bathing assist Assist Level: Touching or steadying assistance(Pt > 75%)      Upper Body Dressing/Undressing Upper body dressing   What is the patient wearing?: Pull over shirt/dress     Pull over shirt/dress - Perfomed by patient: Thread/unthread left sleeve, Put head through opening, Pull shirt over trunk Pull over shirt/dress - Perfomed by helper: Thread/unthread right sleeve        Upper body assist Assist Level: Touching or steadying assistance(Pt > 75%)      Lower Body Dressing/Undressing Lower body dressing   What is the patient wearing?: Pants, Shoes, Underwear Underwear - Performed by patient: Thread/unthread right underwear leg, Thread/unthread left underwear leg, Pull underwear up/down   Pants- Performed by patient: Thread/unthread right pants leg, Thread/unthread left pants leg, Pull pants up/down Pants- Performed by helper: Thread/unthread right pants leg, Thread/unthread left pants leg         Shoes - Performed by patient: Don/doff right shoe, Don/doff left shoe            Lower body assist Assist for lower body dressing: Touching or  steadying assistance (Pt > 75%)      Toileting Toileting Toileting activity did not occur: No continent bowel/bladder event Toileting steps completed by patient: Adjust clothing prior to toileting Toileting steps completed by helper: Adjust clothing prior to toileting, Performs perineal hygiene, Adjust clothing  after toileting Toileting Assistive Devices: Grab bar or rail  Toileting assist Assist level: Touching or steadying assistance (Pt.75%)   Transfers Chair/bed transfer Chair/bed transfer activity did not occur: Safety/medical concerns Chair/bed transfer method: Ambulatory Chair/bed transfer assist level: Touching or steadying assistance (Pt > 75%) Chair/bed transfer assistive device: Armrests     Locomotion Ambulation     Max distance: 150' Assist level: Touching or steadying assistance (Pt > 75%)   Wheelchair   Type: Manual Max wheelchair distance: 150 ft Assist Level: Supervision or verbal cues  Cognition Comprehension Comprehension assist level: Understands complex 90% of the time/cues 10% of the time  Expression Expression assist level: Expresses complex 90% of the time/cues < 10% of the time  Social Interaction Social Interaction assist level: Interacts appropriately with others with medication or extra time (anti-anxiety, antidepressant).  Problem Solving Problem solving assist level: Solves complex problems: With extra time  Memory Memory assist level: Complete Independence: No helper  Medical Problem List and Plan: 1.  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  -continue PT, OT, SLP   -ELOS  -Patient to see Rehab MD/provider in the office for transitional care encounter in 1-2 weeks.     2.  DVT Prophylaxis/Anticoagulation: Pharmaceutical: Heparin 3. Pain Management: Continue hydrocodone as needed 4. Mood: LCSW to follow for evaluation and support 5. Neuropsych: This patient is capable of making decisions on his own behalf. 6. Skin/Wound Care: Monitor incision for healing.  Routine pressure relief measures. 7. Fluids/Electrolytes/Nutrition: encourage PO  -continue KDur 8.  GERD:  protonix 9.  Seizure prophylaxis: Keppra twice daily 10.  Leukocytosis: Likely reactive due to steroids.  resolved 11.  ABLA: 11.5 9/9 12.   Thrombocytopenia: Monitor for signs of bleeding.  recovered to 225 9/9 13. Early extensor tone RLE:   -baclofen trial:   10mg  bid , tolerating well--continue this dose at home  -PRAFO, ROM   -foot up AFO    LOS (Days) 9 A FACE TO FACE EVALUATION WAS PERFORMED  Meredith Staggers, MD 09/03/2018 8:43 AM

## 2018-09-03 NOTE — Progress Notes (Signed)
Social Work  Discharge Note  The overall goal for the admission was met for: DC SAT 9/14  Discharge location: Yes-HOME WITH WIFE WHO CAN PROVIDE 24 HR SUPERVISION  Length of Stay: Yes-11 DAYS  Discharge activity level: Yes-SUPERVISION LEVEL  Home/community participation: Yes  Services provided included: MD, RD, PT, OT, SLP, RN, CM, TR, Pharmacy, Neuropsych and SW  Financial Services: Medicare and Private Insurance: [employer]  Follow-up services arranged: Outpatient: [facility]-PT,OT,SP-9/17-TUESDAY 8:15-11:00, DME: [facility] and Patient/Family has no preference for HH/DME agencies  Comments (or additional information):WIFE WAS HERE TO SEE SP AND PT BUT DECLINED TO COME IN FOR MORE EDUCATION DUE TO BEING ILL HERSELF. SHE HAD NO QUESTIONS OR CONCERNS ABOUT HIS CARE AT DC.  Patient/Family verbalized understanding of follow-up arrangements: Yes  Individual responsible for coordination of the follow-up plan: JUDY-WIFE  Confirmed correct DME delivered: Elease Hashimoto 09/03/2018    Shyann Hefner, Gardiner Rhyme

## 2018-09-04 DIAGNOSIS — D62 Acute posthemorrhagic anemia: Secondary | ICD-10-CM

## 2018-09-04 NOTE — Progress Notes (Signed)
[place] PHYSICAL MEDICINE & REHABILITATION     PROGRESS NOTE    Subjective/Complaints: Patient seen lying in bed this morning. He states he slept well overnight. He states he recommended for discharge.  ROS: Denies CP, SOB, N/V/D    Objective:  No results found. No results for input(s): WBC, HGB, HCT, PLT in the last 72 hours. No results for input(s): NA, K, CL, GLUCOSE, BUN, CREATININE, CALCIUM in the last 72 hours.  Invalid input(s): CO CBG (last 3)  No results for input(s): GLUCAP in the last 72 hours.  Wt Readings from Last 3 Encounters:  09/01/18 91.2 kg  08/18/18 90.4 kg  08/10/18 90.4 kg     Intake/Output Summary (Last 24 hours) at 09/04/2018 0754 Last data filed at 09/04/2018 0523 Gross per 24 hour  Intake 720 ml  Output 400 ml  Net 320 ml    Vital Signs: Blood pressure 106/73, pulse 66, temperature 97.8 F (36.6 C), resp. rate 16, height 5\' 9"  (1.753 m), weight 91.2 kg, SpO2 100 %. Physical Exam:  Constitutional: No distress . Vital signs reviewed. HENT: Normocephalic.  Atraumatic. Eyes: EOMI. No discharge. Cardiovascular: RRR. No JVD. Respiratory: CTA Bilaterally. Normal effort. GI: BS +. Non-distended. Musc: No edema or tenderness in extremities. Neurological: He is alert and oriented  Motor:  RUE:  Apraxia RLE: >/3/5 HF, KE Skin: Cranial wound with dressing C/D/I  Assessment/Plan: 1. 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 Physiatrist is providing close team supervision and 24 hour management of active medical problems listed below. Physiatrist and rehab team continue to assess barriers to discharge/monitor patient progress toward functional and medical goals.  Function:  Bathing Bathing position   Position: Shower  Bathing parts Body parts bathed by patient: Right arm, Chest, Abdomen, Front perineal  area, Buttocks, Right upper leg, Left upper leg, Right lower leg, Left lower leg, Left arm Body parts bathed by helper: Back  Bathing assist Assist Level: Set up      Upper Body Dressing/Undressing Upper body dressing   What is the patient wearing?: Pull over shirt/dress     Pull over shirt/dress - Perfomed by patient: Thread/unthread left sleeve, Put head through opening, Pull shirt over trunk, Thread/unthread right sleeve Pull over shirt/dress - Perfomed by helper: Thread/unthread right sleeve        Upper body assist Assist Level: Supervision or verbal cues      Lower Body Dressing/Undressing Lower body dressing   What is the patient wearing?: Pants, Shoes, Underwear, Socks, AFO Underwear - Performed by patient: Thread/unthread right underwear leg, Thread/unthread left underwear leg, Pull underwear up/down   Pants- Performed by patient: Thread/unthread right pants leg, Thread/unthread left pants leg, Pull pants up/down Pants- Performed by helper: Thread/unthread right pants leg, Thread/unthread left pants leg     Socks - Performed by patient: Don/doff right sock, Don/doff left sock   Shoes - Performed by patient: Don/doff right shoe, Don/doff left shoe, Fasten right, Fasten left     AFO - Performed by helper: Don/doff right AFO      Lower body assist Assist for lower body dressing: Supervision or verbal cues, Set up      [employer] activity did not occur: No continent bowel/bladder event Toileting steps completed by patient: Adjust clothing prior to toileting, Performs perineal hygiene, Adjust clothing after toileting Toileting steps completed by helper: Adjust clothing prior to toileting, Performs perineal  hygiene, Adjust clothing after toileting Toileting Assistive Devices: Grab bar or rail  Toileting assist Assist level: Set up/obtain supplies   Transfers Chair/bed transfer Chair/bed transfer activity did not occur: Safety/medical  concerns Chair/bed transfer method: Ambulatory Chair/bed transfer assist level: Supervision or verbal cues Chair/bed transfer assistive device: Armrests     Locomotion Ambulation     Max distance: 150' Assist level: Supervision or verbal cues   Wheelchair   Type: Manual Max wheelchair distance: 150 ft Assist Level: Supervision or verbal cues  Cognition Comprehension Comprehension assist level: Follows complex conversation/direction with extra time/assistive device  Expression Expression assist level: Expresses complex ideas: With extra time/assistive device  Social Interaction Social Interaction assist level: Interacts appropriately with others with medication or extra time (anti-anxiety, antidepressant).  Problem Solving Problem solving assist level: Solves complex problems: With extra time  Memory Memory assist level: Complete Independence: No helper  Medical Problem List and Plan: 1.  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  D/c today  Patient to see Rehab MD/provider in the office for transitional care encounter in 1-2 weeks.  2.  DVT Prophylaxis/Anticoagulation: Pharmaceutical: Heparin 3. Pain Management: Continue hydrocodone as needed 4. Mood: LCSW to follow for evaluation and support 5. Neuropsych: This patient is capable of making decisions on his own behalf. 6. Skin/Wound Care: Monitor incision for healing.  Routine pressure relief measures. 7. Fluids/Electrolytes/Nutrition: encourage PO  -continue KDur 8.  GERD:  protonix 9.  Seizure prophylaxis: Keppra twice daily 10.  Leukocytosis: Likely reactive due to steroids.  resolved 11.  ABLA:   Hb 11.5 on 9/9  Cont to monitor 12.  Thrombocytopenia: Resolved  Monitor for signs of bleeding.  recovered to 225 9/9 13. Early extensor tone RLE:   -baclofen trial:   10mg  bid , tolerating well--continue this dose at home  -PRAFO, ROM   -foot up AFO    LOS (Days) 10 A FACE TO FACE  EVALUATION WAS PERFORMED  Gerarda Conklin Lorie Phenix, MD 09/04/2018 7:54 AM

## 2018-09-06 ENCOUNTER — Telehealth: Payer: Self-pay | Admitting: Registered Nurse

## 2018-09-06 NOTE — Telephone Encounter (Signed)
Placed a transitional care call, no answer. Left message for return call. !st attempt.

## 2018-09-07 ENCOUNTER — Encounter: Payer: Self-pay | Admitting: Occupational Therapy

## 2018-09-07 ENCOUNTER — Ambulatory Visit: Payer: Medicare Other | Admitting: Physical Therapy

## 2018-09-07 ENCOUNTER — Ambulatory Visit: Payer: Medicare Other

## 2018-09-07 ENCOUNTER — Ambulatory Visit: Payer: Medicare Other | Attending: Physical Medicine & Rehabilitation | Admitting: Occupational Therapy

## 2018-09-07 ENCOUNTER — Other Ambulatory Visit: Payer: Self-pay

## 2018-09-07 ENCOUNTER — Encounter: Payer: Self-pay | Admitting: Physical Therapy

## 2018-09-07 VITALS — BP 142/113

## 2018-09-07 DIAGNOSIS — M6281 Muscle weakness (generalized): Secondary | ICD-10-CM | POA: Insufficient documentation

## 2018-09-07 DIAGNOSIS — R2681 Unsteadiness on feet: Secondary | ICD-10-CM | POA: Diagnosis present

## 2018-09-07 DIAGNOSIS — R41841 Cognitive communication deficit: Secondary | ICD-10-CM | POA: Insufficient documentation

## 2018-09-07 DIAGNOSIS — R293 Abnormal posture: Secondary | ICD-10-CM | POA: Insufficient documentation

## 2018-09-07 DIAGNOSIS — R278 Other lack of coordination: Secondary | ICD-10-CM | POA: Diagnosis present

## 2018-09-07 DIAGNOSIS — R2689 Other abnormalities of gait and mobility: Secondary | ICD-10-CM | POA: Diagnosis present

## 2018-09-07 DIAGNOSIS — R4701 Aphasia: Secondary | ICD-10-CM

## 2018-09-07 DIAGNOSIS — I69353 Hemiplegia and hemiparesis following cerebral infarction affecting right non-dominant side: Secondary | ICD-10-CM | POA: Insufficient documentation

## 2018-09-07 NOTE — Telephone Encounter (Signed)
Transitional Care call  Patient name: James Olsen  DOB: [birth year] 1. Are you/is patient experiencing any problems since coming home? No a. Are there any questions regarding any aspect of care? No 2. Are there any questions regarding medications administration/dosing? No a. Are meds being taken as prescribed? Yes b. "Patient should review meds with caller to confirm"  3. Have there been any falls? No 4. Has Home Health been to the house and/or have they contacted you? [name]  a. If not, have you tried to contact them? NA b. Can we help you contact them? No 5. Are bowels and bladder emptying properly? Yes a. Are there any unexpected incontinence issues? No b. If applicable, is patient following bowel/bladder programs? NA 6. Any fevers, problems with breathing, unexpected pain? No 7. Are there any skin problems or new areas of breakdown? No 8. Has the patient/family member arranged specialty MD follow up (ie cardiology/neurology/renal/surgical/etc.)?  All follow up calls have been made awaiting a return call from Dr. Christella Noa a. Can we help arrange? NA 9. Does the patient need any other services or support that we can help arrange? No 10. Are caregivers following through as expected in assisting the patient? Yes 11. Has the patient quit smoking, drinking alcohol, or using drugs as recommended? Patient denies smoking, or using illicit drugs. He reports he used to drink alcohol prior to hospitalization at this time he is not drinking he states.  Appointment date/time 09/13/2018, arrival time 1:00 for 1:20 appointment with Dr. Naaman Plummer. At  [place]

## 2018-09-07 NOTE — Therapy (Signed)
This entire session was performed under direct supervision and direction of a licensed therapist. I have personally read, edited and approve of the note as written.  Lanney Gins, PT, DPT Supplemental Physical Therapist 09/07/18 4:27 PM Pager: [phone] Office: [place] [facility] [address] [place], [place], [address] Phone: [phone]   Fax:  [phone]  Physical Therapy Evaluation  Patient Details  Name: CLEVE PAOLILLO MRN: [id number] Date of Birth: [birth year] Referring Provider: Alger Simons, MD    Encounter Date: 09/07/2018  PT End of Session - 09/07/18 1243    Visit Number  1    Number of Visits  16    Date for PT Re-Evaluation  11/06/18    Authorization Type  Medicare Part A&B- Medicare progress note every 10th visit    Authorization Time Period  Follow medicare guidelines     PT Start Time  1020    PT Stop Time  1100    PT Time Calculation (min)  40 min    Activity Tolerance  Patient tolerated treatment well    Behavior During Therapy  [facility] for tasks assessed/performed       Past Medical History:  Diagnosis Date  . Arthritis   . GERD (gastroesophageal reflux disease)   . Glaucoma   . Macular degeneration   . Wears glasses     Past Surgical History:  Procedure Laterality Date  . CHOLECYSTECTOMY  2008   lap choli  . COLONOSCOPY    . CRANIOTOMY N/A 08/18/2018   Procedure: CRANIOTOMY TUMOR EXCISION;  Surgeon: [name], MD;  Location: [place];  Service: Neurosurgery;  Laterality: N/A;  CRANIOTOMY TUMOR EXCISION  . NAILBED REPAIR Left 05/04/2014   Procedure: LEFT INDEX NAIL ABLATION V-Y FLAP COVERAGE;  Surgeon: [name]., MD;  Location: [place];  Service: Orthopedics;  Laterality: Left;  index  . PILONIDAL CYST EXCISION     x2  . TONSILLECTOMY    . VASECTOMY      Vitals:   09/07/18 1034  BP: (!) 142/113     Subjective Assessment -  09/07/18 1024    Subjective  Pt reports experiencing a brain tumor that has been removed and subsequent stroke. Patient went to CIR for 2.5 weeks working on balance and strength primarily for R sided weakness. Pt reports since Saturday he has regained motion that he previously did not have and is hopeful. Pt reports he continues to have difficulty with stepping, R UE strength and dexterity, and shoulder strength. Pt reports he wears his foot up brace with community ambulation but not for house hold ambulation.    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  No/denies         [facility] PT Assessment - 09/07/18 1029      Assessment   Medical Diagnosis   Acute Cerebral Infarction; Meningioma     Referring Provider  Alger Simons, MD     Onset Date/Surgical Date  08/18/18    Hand Dominance  Left    Next MD Visit  08/18/18-09/04/18    Prior Therapy  CIR      Precautions   Precautions  Fall   wears foot up brace on R LE  Restrictions   Weight Bearing Restrictions  No      Balance Screen   Has the patient fallen in the past 6 months  No    Has the patient had a decrease in activity level because of a fear of falling?   No    Is the patient reluctant to leave their home because of a fear of falling?   No      Home Film/video editor residence    Living Arrangements  Spouse/significant other    Available Help at Discharge  Family    Type of [place] to enter    Entrance Stairs-Number of Steps  2    Entrance Stairs-Rails  Can reach both    Jet;Able to live on main level with bedroom/bathroom    Alternate Level Stairs-Number of Steps  Multilevel with R HR for full flight of stairs    Home Equipment  Shower seat  - built in      Prior Function   Level of [place]  Retired    Leisure  gardening/landscaping, hiking, outside activities       Cognition   Overall Cognitive Status  Impaired/Different from baseline   See SLP notes; demonstrates difficulty with word finding   Memory  --      Sensation   Light Touch  Appears Intact      Coordination   Gross Motor Movements are Fluid and Coordinated  No    Heel Shin Test  intact with L LE= R LE      Posture/Postural Control   Posture/Postural Control  Postural limitations    Postural Limitations  Rounded Shoulders;Forward head      ROM / Strength   AROM / PROM / Strength  AROM;Strength      AROM   Overall AROM   Within functional limits for tasks performed      Strength   Overall Strength  Deficits    Overall Strength Comments  LE strength grossly assessed in sitting with overall 4/5 strength to all major muscle groups with exception to R dorsi flexion= 3/5    Pt demonstrates mild R sided weakness but overall 4/5     Transfers   Transfers  Sit to Stand;Stand to Sit    Sit to Stand  6: Modified independent (Device/Increase time)    Stand to Sit  6: Modified independent (Device/Increase time)    Comments  Pt able to perform STS from standard height chair without UE assist       Ambulation/Gait   Ambulation/Gait  Yes    Ambulation/Gait Assistance  5: Supervision    Ambulation Distance (Feet)  40 Feet    Assistive device  Other (Comment)   Pt ambulates with R foot up brace    Gait Pattern  Step-through pattern;Decreased arm swing - right;Decreased arm swing - left;Decreased weight shift to right;Decreased trunk rotation;Poor foot clearance - right    Ambulation Surface  Indoor;Level    Gait velocity  3.26 ft/sec indicative of community ambulator     Stairs  Yes    Stairs Assistance  5: Supervision    Stair Management Technique  One rail Right;Step to pattern    Number of Stairs  4    Height of Stairs  6       Standardized Balance Assessment   Standardized Balance Assessment  Berg Balance Test  Berg Balance Test   Sit to Stand  Able to stand without using hands and stabilize independently    Standing Unsupported  Able to stand safely 2 minutes    Sitting with Back Unsupported but Feet Supported on Floor or Stool  Able to sit safely and securely 2 minutes    Stand to Sit  Sits safely with minimal use of hands    Transfers  Able to transfer safely, minor use of hands    Standing Unsupported with Eyes Closed  Able to stand 10 seconds with supervision    Standing Ubsupported with Feet Together  Able to place feet together independently and stand for 1 minute with supervision    From Standing, Reach Forward with Outstretched Arm  Can reach confidently >25 cm (10")    From Standing Position, Pick up Object from [place] to pick up shoe safely and easily    From Standing Position, Turn to Look Behind Over each Shoulder  Looks behind from both sides and weight shifts well    Turn 360 Degrees  Able to turn 360 degrees safely but slowly    Standing Unsupported, Alternately Place Feet on Step/Stool  Able to complete >2 steps/needs minimal assist   LOB lifting R LE to step    Standing Unsupported, One Foot in Front  Able to place foot tandem independently and hold 30 seconds    Standing on One Leg  Able to lift leg independently and hold equal to or more than 3 seconds    Total Score  47    Berg comment:  47/56 indicating moderate fall risk potential      Functional Gait  Assessment   Gait assessed   Yes    Gait Level Surface  Walks 20 ft in less than 7 sec but greater than 5.5 sec, uses assistive device, slower speed, mild gait deviations, or deviates 6-10 in outside of the 12 in walkway width.    Change in Gait Speed  Able to change speed, demonstrates mild gait deviations, deviates 6-10 in outside of the 12 in walkway width, or no gait deviations, unable to achieve a major change in velocity, or  uses a change in velocity, or uses an assistive device.    Gait with Horizontal Head Turns  Performs head turns smoothly with slight change in gait velocity (eg, minor disruption to smooth gait path), deviates 6-10 in outside 12 in walkway width, or uses an assistive device.    Gait with Vertical Head Turns  Performs task with slight change in gait velocity (eg, minor disruption to smooth gait path), deviates 6 - 10 in outside 12 in walkway width or uses assistive device    Gait and Pivot Turn  Cannot turn safely, requires assistance to turn and stop.    Step Over Obstacle  Cannot perform without assistance.    Gait with Narrow Base of Support  Ambulates less than 4 steps heel to toe or cannot perform without assistance.    Gait with Eyes Closed  Walks 20 ft, uses assistive device, slower speed, mild gait deviations, deviates 6-10 in outside 12 in walkway width. Ambulates 20 ft in less than 9 sec but greater than 7 sec.    Ambulating Backwards  Walks 20 ft, slow speed, abnormal gait pattern, evidence for imbalance, deviates 10-15 in outside 12 in walkway width.    Steps  Two feet to a stair, must use rail.    Total Score  12  FGA comment:  12/30 indicating high fall risk                Objective measurements completed on examination: See above findings.              PT Education - 09/07/18 1242    Education Details  Initial examination clinical findings, POC moving forward with sessions addressing dynamic balance to improve functional mobility     Person(s) Educated  Patient    Methods  Explanation       PT Short Term Goals - 09/07/18 1255      PT SHORT TERM GOAL #1   Title  Pt will demonstrate independence and compliancy with HEP to further progress towards improving strength and functional mobility     Baseline  Dependent     Time  4    Period  Weeks    Status  New    Target Date  10/07/18      PT SHORT TERM GOAL #2   Title  Pt will improve Berg Balance  Assessment by 3 points indicating minimal detectable change and decreased fall risk    Baseline  46/51 on 9/17    Time  4    Period  Weeks    Status  New    Target Date  10/07/18      PT SHORT TERM GOAL #3   Title  Pt will improve gait speed to 4.08 ft/sec using LRAD and supervision to improve functional mobility     Baseline  3.26 ft/sec on 9/17    Time  4    Period  Weeks    Status  New    Target Date  10/07/18      PT SHORT TERM GOAL #4   Title  Pt will improve FGA score by 4 points to increase dynamic standing during functional gait using no AD    Baseline  12/30 on 9/17    Time  4    Period  Weeks    Status  New    Target Date  10/07/18      PT SHORT TERM GOAL #5   Title  Pt will ambulate 300 feet with LRAD ambulating over uneven surfaces, curbs, ramps, and demonstrate ability to vary gait speed to improve community accessibility safely     Time  4    Period  Weeks    Status  New    Target Date  10/07/18        PT Long Term Goals - 09/07/18 1301      PT LONG TERM GOAL #1   Title  Pt will demonstrate independence with HEP to further progress towards increasing strength and functional mobility     Time  8    Period  Weeks    Status  New    Target Date  11/06/18      PT LONG TERM GOAL #2   Title  Pt will improve Berg Balance Score to >50/56 to decrease fall risk potential     Time  8    Period  Weeks    Status  New    Target Date  10/07/18      PT LONG TERM GOAL #3   Title  Pt will improve FGA score to >17/30 to improve safety and balance during functional mobility with no AD and supervision     Time  8    Period  Weeks    Status  New  Target Date  11/06/18      PT LONG TERM GOAL #4   Title  Pt will ambulate outdoors over uneven surfaces, curbs, ramps, inclines, and demonstrate ability to vary gait speed with LRAD and Mod I to improve safety with community accessibility     Time  8    Period  Weeks    Status  New    Target Date  11/06/18      PT LONG  TERM GOAL #5   Title  Pt will negotiate 12 steps using single HR and recirprocal stepping technique, mod I to increase safety and independence with community accessibility indicating improvement in LE strength    Time  8    Period  Weeks    Status  New    Target Date  11/06/18      Additional Long Term Goals   Additional Long Term Goals  Yes      PT LONG TERM GOAL #6   Title  Pt will improve gait speed to >/= to 4.37 ft/sec indicating normal walking speed with Mod I and LRAD to increase independence with functional mobility     Time  8    Period  Weeks    Status  New    Target Date  11/06/18             Plan - 09/07/18 1246    Clinical Impression Statement  Pt is a 72 year old male referred to Neuro OPPT for evaluation of strength, balance and functional mobility s/p Acute Cerebral Infarction with subsequent right sided weakness. 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 Pt's score of 47/56 on the Berg Balance Assessment indicates pt is at moderate risk for falls. Pt's functional gait assessment score of 12/30 indicates patient is a high risk for falls. Pt would benefit from skilled PT to address these impairments and functional limitations to maximize functional mobility independence and reduce falls risk.    History and Personal Factors relevant to plan of care:  Stroke, OA, GERD, Macular degeneration, excision of left frontal Meningioma on 08/18/2018    Clinical Presentation  Stable    Clinical Presentation due to:  Stroke, OA, GERD, Macular degeneration, excision of left frontal Meningioma on 08/18/2018    Clinical Decision Making  Low    Rehab Potential  Good    Clinical Impairments Affecting Rehab Potential  R sided weakness with difficulty negotiating obstacles and performing  high level balance    PT Frequency  2x / week    PT Duration  8 weeks    PT Treatment/Interventions  ADLs/Self Care Home Management;Gait training;Neuromuscular re-education;Functional mobility training;Stair training;Cognitive remediation;Energy conservation;Patient/family education;Therapeutic activities;Therapeutic exercise;Orthotic Fit/Training;Balance training;DME Instruction    PT Next Visit Plan  Establish HEP, dynamic balance, possibly introduce cane for community ambulation 2/2 high fall risk     PT Home Exercise Plan  TBD    Consulted and Agree with Plan of Care  Patient       Patient will benefit from skilled therapeutic intervention in order to improve the following deficits and impairments:  Abnormal gait, Decreased cognition, Decreased knowledge of use of DME, Improper body mechanics, Decreased mobility, Postural dysfunction, Decreased strength, Impaired perceived functional ability, Impaired UE functional use, Decreased balance, Decreased safety awareness, Difficulty walking  Visit Diagnosis: Other abnormalities  of gait and mobility  Abnormal posture  Muscle weakness (generalized)     Problem List Patient Active Problem List   Diagnosis Date Noted  . Acute cerebral infarction ([place])   . Thrombocytopenia ([place])   . 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 Acute blood loss anemia   . Generalized OA   . Gastroesophageal reflux disease   . Seizure prophylaxis   . Leukocytosis   . Tumor 08/18/2018  . Meningioma determined by biopsy of brain ([place]) 08/18/2018  . MACULAR DEGENERATION 02/29/2008  . ALLERGY 02/29/2008  . Nonspecific (abnormal) findings on radiological and other examination of body structure 12/26/2007  . NONSPECIFIC ABNORM FIND RAD&OTH EXAM LUNG FIELD 12/26/2007  . GALLSTONES 10/01/2007  . ELEVATION, TRANSAMINASE/LDH LEVELS 09/06/2007  . HEPATITIS NOS 09/01/2007  . ABDOMINAL PAIN, EPIGASTRIC 09/01/2007   Floreen Comber, SPT 09/07/18 1:00 PM  Lanney Gins, PT, DPT Supplemental Physical Therapist 09/07/18 4:21 PM Pager: [phone] Office: [facility] [facility] [address] [place], [place], [address] Phone: [phone]   Fax:  [phone]  Name: DEONTRA PEREYRA MRN: [id number] Date of Birth: [birth year]

## 2018-09-08 ENCOUNTER — Other Ambulatory Visit (HOSPITAL_COMMUNITY): Payer: Self-pay | Admitting: Neurosurgery

## 2018-09-08 DIAGNOSIS — D329 Benign neoplasm of meninges, unspecified: Secondary | ICD-10-CM

## 2018-09-08 NOTE — Therapy (Signed)
[facility] [address] [place], [place], [address] Phone: [phone]   Fax:  [phone]  Speech Language Pathology Evaluation  Patient Details  Name: James Olsen MRN: [id number] Date of Birth: [birth year] Referring Provider: Alger Simons MD   Encounter Date: 09/07/2018  End of Session - 09/07/18 1708    Visit Number  1    Number of Visits  17    Date for SLP Re-Evaluation  12/03/18    SLP Start Time  0935    SLP Stop Time   1016    SLP Time Calculation (min)  41 min    Activity Tolerance  Patient tolerated treatment well       Past Medical History:  Diagnosis Date  . Arthritis   . GERD (gastroesophageal reflux disease)   . Glaucoma   . Macular degeneration   . Wears glasses     Past Surgical History:  Procedure Laterality Date  . CHOLECYSTECTOMY  2008   lap choli  . COLONOSCOPY    . CRANIOTOMY N/A 08/18/2018   Procedure: CRANIOTOMY TUMOR EXCISION;  Surgeon: [name], MD;  Location: [place] [facility];  Service: Neurosurgery;  Laterality: N/A;  CRANIOTOMY TUMOR EXCISION  . NAILBED REPAIR Left 05/04/2014   Procedure: LEFT INDEX NAIL ABLATION V-Y FLAP COVERAGE;  Surgeon: [name]., MD;  Location: [facility];  Service: Orthopedics;  Laterality: Left;  index  . PILONIDAL CYST EXCISION     x2  . TONSILLECTOMY    . VASECTOMY      There were no vitals filed for this visit.  Subjective Assessment - 09/07/18 0937    Subjective  "We did a number of games - (5 seconds) for instance (3 seconds) names of birds." "My mind is more prone to wandering - or distractions."     Currently in Pain?  No/denies         SLP Evaluation [facility] - 09/07/18 0937      SLP Visit Information   SLP Received On  09/07/18    Referring Provider  Alger Simons MD    Onset Date  08-18-18    Medical Diagnosis  meningioma/resection      Subjective   Subjective  "I am less-so, but I tend to string  together a group of words and - - then get hung up. Lack of a word or a phrase."      General Information   HPI  Pt is a 72 year old male noticed to have a lump under his scalp by his hairdresser. 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      Prior Functional Status   Cognitive/Linguistic Baseline  Within functional limits    Type of Home  House     Lives With  Spouse    Vocation  Retired   Building control surveyor   Overall Cognitive Status  Impaired/Different from baseline    Area of Impairment  Awareness;Attention    Attention Comments  Pt with c/o working memory deficits    Awareness Comments  Pt showed limited awareness of errors with sequencing cards, and awareness of errors did not improve with verbal retelling of sequences.     Awareness  Impaired    Awareness Impairment  Emergent impairment;Other (comment)   attention to detail   Behaviors  Impulsive   decr'd awareness of errors/details     Auditory Comprehension  Overall Auditory Comprehension  Other (comment)    Overall Auditory Comprehension Comments  further testing necessary to confirm      Verbal Expression   Overall Verbal Expression  Impaired    Initiation  No impairment    Level of Generative/Spontaneous Verbalization  Conversation   pausing and hesitation usually   Naming  No impairment   54/60 - BNT   Effective Techniques  Other (Comment)   "Dumbing it down", taking extra time   Other Verbal Expression Comments  Some splinter skills in verbal expression/sentence construction in mod complex conversation.       Oral Motor/Sensory Function   Overall Oral Motor/Sensory Function  Appears within functional limits for tasks assessed      Motor Speech   Overall Motor Speech  Appears within functional limits for tasks assessed      Standardized Assessments   Standardized Assessments   [place] Naming Test-2nd  edition    [place] Naming Test-2nd edition   54/60 (above WNL)                      SLP Education - 09/07/18 1707    Education Details  eval results, possible goals    Person(s) Educated  Patient    Methods  Explanation    Comprehension  Verbalized understanding;Need further instruction       SLP Short Term Goals - 09/08/18 0759      SLP SHORT TERM GOAL #1   Title  pt will provide sentence responses 18/20 with WFL speech fluidity over three sessions    Time  4    Period  Weeks    Status  New      SLP SHORT TERM GOAL #2   Title  pt will undergo formal cognitive linguistic assessment by visit number 3    Time  2    Period  Weeks    Status  New      SLP SHORT TERM GOAL #3   Title  using compensatory strategies pt will functionally participate in 10 minutes simple conversation over 3 sessions    Time  4    Status  New       SLP Long Term Goals - 09/08/18 2951      SLP LONG TERM GOAL #1   Title  using compensatory strategies pt will functionally participate in 10 minutes mod complex conversation over 3 sessions    Time  8    Period  Weeks   or 17 sessions   Status  New      SLP LONG TERM GOAL #2   Title  pt will functionally perform complex naming tasks with use of compensations over 2 sessions    Time  8    Period  Weeks    Status  New       Plan - 09/07/18 1709    Clinical Impression Statement  Pt presents with above WNL word finding for his age, as measured by [place] - 2. Pt with much difficulty with decoding scrambled unknown/uncommon sentences (e.g., "sunlight need and plants light"), whereas he req'd approx average 20 seconds to unscramble common sentences (e.g., "to I want go home")     Speech Therapy Frequency  2x / week    Duration  --   8 weeks or 17 visits   Treatment/Interventions  SLP instruction and feedback;Compensatory strategies;Functional tasks;Cognitive reorganization;Internal/external aids;Patient/family  education;Multimodal communcation approach;Environmental controls;Language facilitation;Cueing hierarchy    Potential to Achieve Goals  Good    Consulted and Agree with Plan of Care  Patient       Patient will benefit from skilled therapeutic intervention in order to improve the following deficits and impairments:   Aphasia  Cognitive communication deficit    Problem List Patient Active Problem List   Diagnosis Date Noted  . Acute cerebral infarction ([place])   . Thrombocytopenia ([place])   . 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 Acute blood loss anemia   . Generalized OA   . Gastroesophageal reflux disease   . Seizure prophylaxis   . Leukocytosis   . Tumor 08/18/2018  . Meningioma determined by biopsy of brain ([place]) 08/18/2018  . MACULAR DEGENERATION 02/29/2008  . ALLERGY 02/29/2008  . Nonspecific (abnormal) findings on radiological and other examination of body structure 12/26/2007  . NONSPECIFIC ABNORM FIND RAD&OTH EXAM LUNG FIELD 12/26/2007  . GALLSTONES 10/01/2007  . ELEVATION, TRANSAMINASE/LDH LEVELS 09/06/2007  . HEPATITIS NOS 09/01/2007  . ABDOMINAL PAIN, EPIGASTRIC 09/01/2007    Jad Johansson ,MS, CCC-SLP  09/08/2018, 8:15 AM  [facility] [address] [place], [place], [address] Phone: [phone]   Fax:  [phone]  Name: James Olsen MRN: [id number] Date of Birth: 04/04/1946

## 2018-09-08 NOTE — Therapy (Addendum)
[facility] [address] [place], [place], [address] Phone: [phone]   Fax:  [phone]  Occupational Therapy Evaluation  Patient Details  Name: James Olsen MRN: [id number] Date of Birth: [birth year] Referring Provider: Dr. Alger Simons   Encounter Date: 09/07/2018  OT End of Session - 09/08/18 2020    Visit Number  1    Number of Visits  25    Date for OT Re-Evaluation  12/06/18    Authorization Type  cert. 09/07/18-12/06/18    Authorization Time Period  Medicare & [employer], covered 100%    Authorization - Visit Number  1    Authorization - Number of Visits  10    Activity Tolerance  Patient tolerated treatment well    Behavior During Therapy  WFL for tasks assessed/performed     In time 8:33 Out time 9:15    Past Medical History:  Diagnosis Date  . Arthritis   . GERD (gastroesophageal reflux disease)   . Glaucoma   . Macular degeneration   . Wears glasses     Past Surgical History:  Procedure Laterality Date  . CHOLECYSTECTOMY  2008   lap choli  . COLONOSCOPY    . CRANIOTOMY N/A 08/18/2018   Procedure: CRANIOTOMY TUMOR EXCISION;  Surgeon: [name], MD;  Location: [facility];  Service: Neurosurgery;  Laterality: N/A;  CRANIOTOMY TUMOR EXCISION  . NAILBED REPAIR Left 05/04/2014   Procedure: LEFT INDEX NAIL ABLATION V-Y FLAP COVERAGE;  Surgeon: [name]., MD;  Location: [facility];  Service: Orthopedics;  Laterality: Left;  index  . PILONIDAL CYST EXCISION     x2  . TONSILLECTOMY    . VASECTOMY      There were no vitals filed for this visit.     [facility] OT Assessment - 09/08/18 0001      Assessment   Medical Diagnosis  Meningioma, acute cerebral infarction    Referring Provider  Dr. Alger Simons    Onset Date/Surgical Date  08/18/18    Hand Dominance  Left    Next MD Visit  08/18/18-09/04/18    Prior Therapy  CIR      Precautions   Precautions  Fall;Other  (comment)    Precaution Comments  no driving, nothing strenuous      Balance Screen   Has the patient fallen in the past 6 months  No      Home  Environment   Family/patient expects to be discharged to:  Private residence    Living Arrangements  Spouse/significant other   wife   Lives With  Spouse   2 kittens     Prior Function   Level of [place]  Retired   Education officer, community   Leisure  gardening/landscaping, hiking, kayaking      ADL   Eating/Feeding  Set up    Grooming  Modified independent   Copy   Upper Body Bathing  Minimal assistance   back and washing LUE   Lower Body Bathing  Modified independent    Upper Body Dressing  Increased time    Lower Body Dressing  Increased time   assist for tying shoes    Toilet Transfer  Modified independent    Toileting - Clothing Manipulation  Modified independent    [place] Transfer  Supervision/safety    Transfers/Ambulation Related to ADL's  mod I  IADL   Prior Level of Function Shopping  pt did all prior; carrying groceries with LUE, able to push cart    Prior Level of Function Light Housekeeping  pt did most prior; made bed, load/unload dishwasher since d/c    Prior Level of Function Meal Prep  pt did all cooking prior;  have made coffee, made sandwiches    Prior Level of Function Community Mobility  not currently driving due to restrictions    Community Mobility  Relies on family or friends for transportation    Medication Management  Is responsible for taking medication in correct dosages at correct time    Prior Level of Function Financial Management  wife has always performed      Mobility   Mobility Status  Independent    Mobility Status Comments  See PT eval for details      Vision - History   Baseline Vision  Wears glasses all the time    Visual History  Macular degeneration    Additional Comments  denies  visual changes      Activity Tolerance   Activity Tolerance Comments  30-57min of activity prior to fatigue4      Cognition   Overall Cognitive Status  Cognition to be further assessed in functional context PRN   aphasia   Cognition Comments  cognition not formally assessed at this time, but able to answer eval questions appropriately and follow directions during eval      Sensation   Light Touch  Appears Intact   pt denies change     Coordination   Gross Motor Movements are Fluid and Coordinated  No    Fine Motor Movements are Fluid and Coordinated  No    9 Hole Peg Test  Right;Left    Right 9 Hole Peg Test  unable    Box and Blocks  unable with RUE due to decr ROM    Other  using RUE as stabilizer for selected tasks    Coordination  Pt able to grasp 1inch block and cylinder object with mod compensation patterns and multiple attempts, min A      AROM   Overall AROM   Deficits    Overall AROM Comments  LUE WNL.  RUE: shoulder flex to 45*, abduction 40*, elbow flex approx 75%, extension WNL, supination 90%, wrist ext to neutral, gross finger flex/ext WNL, only able to oppose to 2nd digit/lateral aspect.        Strength   Overall Strength  Deficits    Overall Strength Comments  RUE (see AROM, grip strength)      Hand Function   Right Hand Grip (lbs)  22    Left Hand Grip (lbs)  84                      OT Education - 09/07/18 1939    Education Details  OT POC/Eval results    Person(s) Educated  Patient    Methods  Explanation    Comprehension  Verbalized understanding       OT Short Term Goals - 09/08/18 2037      OT SHORT TERM GOAL #1   Title  Pt will be independent with initial HEP.--check STG 10/22/18    Time  6    Period  Weeks    Status  New      OT SHORT TERM GOAL #2   Title  Pt will be able to bathe mod I.  Time  6    Period  Weeks    Status  New      OT SHORT TERM GOAL #3   Title  Pt will be able to complete clothing fasteners mod I.     Time  6    Period  Weeks    Status  New      OT SHORT TERM GOAL #4   Title  Pt will be able to cut food mod I.    Time  6    Period  Weeks    Status  New      OT SHORT TERM GOAL #5   Title  Pt will demo at least 60* R shoulder flex with only min compensation for improved functional reach.    Baseline  45* with mod compensation    Period  Weeks    Status  New      OT SHORT TERM GOAL #6   Title  Pt will be able to use RUE as stabilizer consistently for ADLs.    Time  6    Period  Weeks    Status  New        OT Long Term Goals - 09/08/18 2040      OT LONG TERM GOAL #1   Title  Pt will be independent with updated HEP.--check LTGs 12/06/18    Time  12    Period  Weeks    Status  New      OT LONG TERM GOAL #2   Title  Pt will perform simple cooking tasks mod I.    Time  12    Period  Weeks    Status  New      OT LONG TERM GOAL #3   Title  Pt will perform mod complex home maintenance tasks mod I.    Time  12    Period  Weeks    Status  New      OT LONG TERM GOAL #4   Title  Pt will use RUE as nondominant assist at least 75% of the time for ADLs.    Time  8    Period  Weeks    Status  New      OT LONG TERM GOAL #5   Title  Pt will demo at least 90* R shoulder flex for functional reach with min compensation.    Period  Weeks    Status  New      OT LONG TERM GOAL #6   Title  Pt will be able to carry light bag of groceries with RUE.    Time  12    Period  Weeks    Status  New      OT LONG TERM GOAL #7   Title  Pt will demo improved coordination/RUE functional reach as shown by scoring at least 30 on box and blocks test.    Time  12    Period  Weeks    Status  New      OT LONG TERM GOAL #8   Title  Pt will demo at least 35lbs R grip strength for opening containers/carrying objects.    Baseline  22lbs    Time  12    Period  Weeks    Status  New            Plan - 09/08/18 2022    Clinical Impression Statement  Pt is a 72 y.o. male s/p craniotomy  for tumor resection  of meningioma 08/18/18 and acute cerebral infarction.  Pt with PMH that includes:  arthritis, macular degeneration, hepatitis, GERD.  Pt presents with aphasia, R hemiplegia with decr coordination and decr RUE functional use (pt writes with L hand, but did a lot of things with RUE prior), decr balance affecting ADL/IADL performance.  Pt would benefit from occupational therapy to address R hemiplegia for improved RUE functional use and improved ADL/IADL performance.    Occupational Profile and client history currently impacting functional performance  Pt was very independent and active prior to surgery.  Pt drove, performed yardwork, most cooking/home maintenance tasks, and enjoyed outdoor leisure activities.  Pt is currently not able to drive, perform yardwork, perform previous cooking/home maintenance tasks or leisure tasks.  Pt's goal is to return to 90-100% of "normal"    Occupational performance deficits (Please refer to evaluation for details):  ADL's;IADL's;Social Participation;Leisure    Rehab Potential  Good    OT Frequency  2x / week    OT Duration  12 weeks   +eval   OT Treatment/Interventions  Self-care/ADL training;Cryotherapy;Paraffin;Therapeutic exercise;DME and/or AE instruction;Functional Mobility Training;Cognitive remediation/compensation;Balance training;Splinting;Manual Therapy;Neuromuscular education;Fluidtherapy;Ultrasound;Electrical Stimulation;Aquatic Therapy;Moist Heat;Energy conservation;Passive range of motion;Therapeutic activities;Patient/family education    Plan  initiate HEP, neuro re-ed RUE    Clinical Decision Making  Several treatment options, min-mod task modification necessary    Consulted and Agree with Plan of Care  Patient       Patient will benefit from skilled therapeutic intervention in order to improve the following deficits and impairments:  Abnormal gait, Decreased balance, Decreased endurance, Decreased mobility, Decreased range of motion,  Decreased cognition, Decreased activity tolerance, Decreased coordination, Decreased knowledge of use of DME, Decreased strength, Impaired flexibility, Impaired UE functional use  Visit Diagnosis: Hemiplegia and hemiparesis following cerebral infarction affecting right non-dominant side (HCC)  Other lack of coordination  Unsteadiness on feet    Problem List Patient Active Problem List   Diagnosis Date Noted  . Acute cerebral infarction ([facility])   . Thrombocytopenia ([place])   . 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 Acute blood loss anemia   . Generalized OA   . Gastroesophageal reflux disease   . Seizure prophylaxis   . Leukocytosis   . Tumor 08/18/2018  . Meningioma determined by biopsy of brain ([facility]) 08/18/2018  . MACULAR DEGENERATION 02/29/2008  . ALLERGY 02/29/2008  . Nonspecific (abnormal) findings on radiological and other examination of body structure 12/26/2007  . NONSPECIFIC ABNORM FIND RAD&OTH EXAM LUNG FIELD 12/26/2007  . GALLSTONES 10/01/2007  . ELEVATION, TRANSAMINASE/LDH LEVELS 09/06/2007  . HEPATITIS NOS 09/01/2007  . ABDOMINAL PAIN, EPIGASTRIC 09/01/2007    [facility] 09/08/2018, 8:48 PM  [facility] [address] [place], [place], [address] Phone: [phone]   Fax:  [phone]  Name: James Olsen MRN: [id number] Date of Birth: 01-21-1946   Vianne Bulls, OTR/L [facility] [address] [place], [place]  [address] [phone] phone [phone] 09/08/18 8:48 PM

## 2018-09-09 ENCOUNTER — Ambulatory Visit: Payer: Medicare Other | Admitting: Occupational Therapy

## 2018-09-09 ENCOUNTER — Encounter: Payer: Self-pay | Admitting: Occupational Therapy

## 2018-09-09 ENCOUNTER — Ambulatory Visit: Payer: Medicare Other | Admitting: Speech Pathology

## 2018-09-09 DIAGNOSIS — I69353 Hemiplegia and hemiparesis following cerebral infarction affecting right non-dominant side: Secondary | ICD-10-CM

## 2018-09-09 DIAGNOSIS — R2681 Unsteadiness on feet: Secondary | ICD-10-CM

## 2018-09-09 DIAGNOSIS — R278 Other lack of coordination: Secondary | ICD-10-CM

## 2018-09-09 DIAGNOSIS — R41841 Cognitive communication deficit: Secondary | ICD-10-CM

## 2018-09-09 DIAGNOSIS — R4701 Aphasia: Secondary | ICD-10-CM

## 2018-09-09 NOTE — Patient Instructions (Addendum)
   Use ball, cut pool noodle, shoe box, new paper towel roll, etc.  Something lightweight, that you can hold with both hands on the side so that the thumb is facing up, about shoulder width apart.   1.  Lay on back, hold ball at chest, slowly push ball up to ceiling keeping elbows close to your side.  Keep shoulders away from ears.  Start with 5, rest, then do 5 more.  Can gradually increase repetitions prior to rest, but quality of movement should be most important.  2-3x/day  2.  Lay on back, hold ball with both hands with elbows straight with ball on tops of your legs.  Slowly bring ball up and back to shoulder height and slowly lower back to legs, keeping elbows straight the whole way.  Keep shoulders away from ears.  Start with 3-5, rest, then do 3-5 more (10 total).  Can gradually increase repetitions prior to rest, but quality of movement should be most important.  2-3x/day  3.  Pick up small empty bottles from low surface (stand and pick up from table, or sit and pick up from coffee table).  Lead with elbow before leaning with body, bend elbow to bring toward you, then rotate shoulder out to release.  Repeat 10 times.  Rest as you need to.     Edema Management:  1.  Elevate arm when sitting:  Use pillows to support arm at your side/arm rest, support on table when eating.  2.  Open/close hand frequently. 3.  Use hand as much as you can for activities (with shoulder down low) 4.  Massage hand lightly, fingertips>palm (sides, and top/bottom), then back of hand, then palm.  Always rub from fingertips towards arm.

## 2018-09-09 NOTE — Therapy (Signed)
[facility] [address] [place], [place], [address] Phone: [phone]   Fax:  [phone]  Speech Language Pathology Treatment  Patient Details  Name: James Olsen MRN: [id number] Date of Birth: 1946-11-09 Referring Provider: Alger Simons Olsen   Encounter Date: 09/09/2018  End of Session - 09/09/18 1047    Visit Number  2    Number of Visits  17    Date for SLP Re-Evaluation  12/03/18    SLP Start Time  0925    SLP Stop Time   1008    SLP Time Calculation (min)  43 min    Activity Tolerance  Patient tolerated treatment well       Past Medical History:  Diagnosis Date  . Arthritis   . GERD (gastroesophageal reflux disease)   . Glaucoma   . Macular degeneration   . Wears glasses     Past Surgical History:  Procedure Laterality Date  . CHOLECYSTECTOMY  2008   lap choli  . COLONOSCOPY    . CRANIOTOMY N/A 08/18/2018   Procedure: CRANIOTOMY TUMOR EXCISION;  Surgeon: James Pall, Olsen;  Location: [facility];  Service: Neurosurgery;  Laterality: N/A;  CRANIOTOMY TUMOR EXCISION  . NAILBED REPAIR Left 05/04/2014   Procedure: LEFT INDEX NAIL ABLATION V-Y FLAP COVERAGE;  Surgeon: James Olsen., Olsen;  Location: [facility];  Service: Orthopedics;  Laterality: Left;  index  . PILONIDAL CYST EXCISION     x2  . TONSILLECTOMY    . VASECTOMY      There were no vitals filed for this visit.  Subjective Assessment - 09/09/18 0926    Subjective  "On and off - I've got good periods and bad periods" re: speech    Currently in Pain?  No/denies            ADULT SLP TREATMENT - 09/09/18 0927      General Information   Behavior/Cognition  Alert;Cooperative;Pleasant mood      Treatment Provided   Treatment provided  Cognitive-Linquistic      Cognitive-Linquistic Treatment   Treatment focused on  Cognition;Aphasia    Skilled Treatment  Formal cognitive assessment completed today. Administered  the Cognitive Linguistic Quick Test (CLQT). Pt scored WNL on memory, attention, executive function, visuospatial skills and clock drawing. Language score is mildly impaired. Pt does report higher level attention and "trouble focusing" as well diffuclty with procedural memory with his phone and laptop, which he reports is improving. [name] denies losing items such as his glasses or remotes, he denies leaving the stove or shower on, or leaving tasks undone. He also denies carelessness such as leaving cabinet doors open. At this time, [name] states that the tasks he is not doing (gardening and making model trains) are due to physical rather than cognitive impairments. 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 Pt verbalized appropriate strategies he uses at home when he expiriences anomia, including circumlocution and descriptions, with mod I. Today, pt 's speech is fluent.      Progression Toward Goals   Progression toward goals  Progressing toward goals       SLP Education - 09/09/18 1042    Education Details  compensations for attention; cognitive linguistic activities to do at home; compensations for anomia    Person(s) Educated  Patient    Methods  Explanation;Demonstration;Verbal cues;Handout  Comprehension  Verbalized understanding;Returned demonstration       SLP Short Term Goals - 09/09/18 1046      SLP SHORT TERM GOAL #1   Title  pt will provide sentence responses 18/20 with WFL speech fluidity over three sessions    Time  4    Period  Weeks    Status  On-going      SLP SHORT TERM GOAL #2   Title  pt will undergo formal cognitive linguistic assessment by visit number 3    Time  2    Period  Weeks    Status  Achieved      SLP SHORT TERM GOAL #3   Title  using compensatory strategies pt will functionally participate in 10 minutes simple conversation over  3 sessions    Time  4    Status  On-going       SLP Long Term Goals - 09/09/18 1046      SLP LONG TERM GOAL #1   Title  using compensatory strategies pt will functionally participate in 10 minutes mod complex conversation over 3 sessions    Time  8    Period  Weeks   or 17 sessions   Status  On-going      SLP LONG TERM GOAL #2   Title  pt will functionally perform complex naming tasks with use of compensations over 2 sessions    Baseline  09/09/18    Time  8    Period  Weeks    Status  On-going       Plan - 09/09/18 1043    Clinical Impression Statement  CLQT administered today - see skilled intervention. Langauge is mildly impaired. Pt reports high level attention impairments. Pt utilized compensations for anomia spontaneously with success. Aphasia persists, affecting communication with spouse and friends. [place] skilled ST to maximize communication for independence and QOL.     Speech Therapy Frequency  2x / week    Treatment/Interventions  SLP instruction and feedback;Compensatory strategies;Functional tasks;Cognitive reorganization;Internal/external aids;Patient/family education;Multimodal communcation approach;Environmental controls;Language facilitation;Cueing hierarchy    Potential to Achieve Goals  Good    Consulted and Agree with Plan of Care  Patient       Patient will benefit from skilled therapeutic intervention in order to improve the following deficits and impairments:   Aphasia  Cognitive communication deficit    Problem List Patient Active Problem List   Diagnosis Date Noted  . Acute cerebral infarction ([facility])   . Thrombocytopenia ([place])   . 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 Acute blood loss anemia   . Generalized OA   . Gastroesophageal reflux disease   . Seizure prophylaxis   . Leukocytosis   . Tumor 08/18/2018  . Meningioma determined by biopsy of brain ([facility]) 08/18/2018  . MACULAR DEGENERATION  02/29/2008  . ALLERGY 02/29/2008  . Nonspecific (abnormal) findings on radiological and other examination of body structure 12/26/2007  . NONSPECIFIC ABNORM FIND RAD&OTH EXAM LUNG FIELD 12/26/2007  . GALLSTONES 10/01/2007  . ELEVATION, TRANSAMINASE/LDH LEVELS 09/06/2007  . HEPATITIS NOS 09/01/2007  . ABDOMINAL PAIN, EPIGASTRIC 09/01/2007    Lesha Jager, Annye Rusk MS, CCC-SLP 09/09/2018, 10:48 AM  [facility] [address] [place], [place], [address] Phone: [phone]   Fax:  [phone]   Name: LEEVI CULLARS James Olsen Date of Birth: August 31, 1946

## 2018-09-09 NOTE — Therapy (Signed)
[facility] [address] [place], [place], [address] Phone: [phone]   Fax:  [phone]  Occupational Therapy Treatment  Patient Details  Name: CARTER KASSEL MRN: [id number] Date of Birth: 1946/10/01 Referring Provider: Dr. Alger Simons   Encounter Date: 09/09/2018  OT End of Session - 09/09/18 0904    Visit Number  2    Number of Visits  25    Date for OT Re-Evaluation  12/06/18    Authorization Type  cert. 09/07/18-12/06/18    Authorization Time Period  Medicare & [employer], covered 100%    Authorization - Visit Number  2    Authorization - Number of Visits  10    OT Start Time  [phone]    OT Stop Time  [phone]    OT Time Calculation (min)  45 min    Activity Tolerance  Patient tolerated treatment well    Behavior During Therapy  [facility] for tasks assessed/performed       Past Medical History:  Diagnosis Date  . Arthritis   . GERD (gastroesophageal reflux disease)   . Glaucoma   . Macular degeneration   . Wears glasses     Past Surgical History:  Procedure Laterality Date  . CHOLECYSTECTOMY  2008   lap choli  . COLONOSCOPY    . CRANIOTOMY N/A 08/18/2018   Procedure: CRANIOTOMY TUMOR EXCISION;  Surgeon: [name], MD;  Location: [facility];  Service: Neurosurgery;  Laterality: N/A;  CRANIOTOMY TUMOR EXCISION  . NAILBED REPAIR Left 05/04/2014   Procedure: LEFT INDEX NAIL ABLATION V-Y FLAP COVERAGE;  Surgeon: [name]., MD;  Location: [facility];  Service: Orthopedics;  Laterality: Left;  index  . PILONIDAL CYST EXCISION     x2  . TONSILLECTOMY    . VASECTOMY      There were no vitals filed for this visit.  Subjective Assessment - 09/09/18 0834    Subjective   Pt reports that RUE has improved in the last few days    Pertinent History  Meningioma s/p craniotomy for tumor resection 08/18/18, Acute cerebral infarction; arthritis, macular degeneration, hepatitis, GERD    Patient Stated  Goals  be back to normal or 95%    Currently in Pain?  No/denies                           OT Education - 09/09/18 0906    Education Details  edema management, proper positioning of RUE with sitting, sleeping, and with movement (bed positioning handout given), initial HEP--see pt instructions    Person(s) Educated  Patient    Methods  Explanation;Demonstration;Verbal cues;Handout;Tactile cues    Comprehension  Verbalized understanding;Returned demonstration;Verbal cues required       OT Short Term Goals - 09/08/18 2037      OT SHORT TERM GOAL #1   Title  Pt will be independent with initial HEP.--check STG 10/22/18    Time  6    Period  Weeks    Status  New      OT SHORT TERM GOAL #2   Title  Pt will be able to bathe mod I.    Time  6    Period  Weeks    Status  New      OT SHORT TERM GOAL #3   Title  Pt will be able to complete clothing fasteners mod I.    Time  6  Period  Weeks    Status  New      OT SHORT TERM GOAL #4   Title  Pt will be able to cut food mod I.    Time  6    Period  Weeks    Status  New      OT SHORT TERM GOAL #5   Title  Pt will demo at least 60* R shoulder flex with only min compensation for improved functional reach.    Baseline  45* with mod compensation    Period  Weeks    Status  New      OT SHORT TERM GOAL #6   Title  Pt will be able to use RUE as stabilizer consistently for ADLs.    Time  6    Period  Weeks    Status  New        OT Long Term Goals - 09/08/18 2040      OT LONG TERM GOAL #1   Title  Pt will be independent with updated HEP.--check LTGs 12/06/18    Time  12    Period  Weeks    Status  New      OT LONG TERM GOAL #2   Title  Pt will perform simple cooking tasks mod I.    Time  12    Period  Weeks    Status  New      OT LONG TERM GOAL #3   Title  Pt will perform mod complex home maintenance tasks mod I.    Time  12    Period  Weeks    Status  New      OT LONG TERM GOAL #4   Title  Pt  will use RUE as nondominant assist at least 75% of the time for ADLs.    Time  8    Period  Weeks    Status  New      OT LONG TERM GOAL #5   Title  Pt will demo at least 90* R shoulder flex for functional reach with min compensation.    Period  Weeks    Status  New      OT LONG TERM GOAL #6   Title  Pt will be able to carry light bag of groceries with RUE.    Time  12    Period  Weeks    Status  New      OT LONG TERM GOAL #7   Title  Pt will demo improved coordination/RUE functional reach as shown by scoring at least 30 on box and blocks test.    Time  12    Period  Weeks    Status  New      OT LONG TERM GOAL #8   Title  Pt will demo at least 35lbs R grip strength for opening containers/carrying objects.    Baseline  22lbs    Time  12    Period  Weeks    Status  New            Plan - 09/09/18 0905    Clinical Impression Statement  Pt responded well to cueing for normal movement patterns and is progressing with RUE movement.  Pt verbalized understanding of initial education.      Occupational Profile and client history currently impacting functional performance  Pt was very independent and active prior to surgery.  Pt drove, performed yardwork, most cooking/home maintenance tasks, and enjoyed outdoor leisure activities.  Pt is currently not able to drive, perform yardwork, perform previous cooking/home maintenance tasks or leisure tasks.  Pt's goal is to return to 90-100% of "normal"    Occupational performance deficits (Please refer to evaluation for details):  ADL's;IADL's;Social Participation;Leisure    Rehab Potential  Good    OT Frequency  2x / week    OT Duration  12 weeks   +eval   OT Treatment/Interventions  Self-care/ADL training;Cryotherapy;Paraffin;Therapeutic exercise;DME and/or AE instruction;Functional Mobility Training;Cognitive remediation/compensation;Balance training;Splinting;Manual Therapy;Neuromuscular education;Fluidtherapy;Ultrasound;Electrical  Stimulation;Aquatic Therapy;Moist Heat;Energy conservation;Passive range of motion;Therapeutic activities;Patient/family education    Plan  neuro re-ed RUE, RUE functional use, wt. bearing    Clinical Decision Making  Several treatment options, min-mod task modification necessary    OT Home Exercise Plan  Education provided:  edema management, proper positioning of RUE, initial HEP    Consulted and Agree with Plan of Care  Patient       Patient will benefit from skilled therapeutic intervention in order to improve the following deficits and impairments:  Abnormal gait, Decreased balance, Decreased endurance, Decreased mobility, Decreased range of motion, Decreased cognition, Decreased activity tolerance, Decreased coordination, Decreased knowledge of use of DME, Decreased strength, Impaired flexibility, Impaired UE functional use  Visit Diagnosis: Hemiplegia and hemiparesis following cerebral infarction affecting right non-dominant side (HCC)  Other lack of coordination  Unsteadiness on feet    Problem List Patient Active Problem List   Diagnosis Date Noted  . Acute cerebral infarction ([facility])   . Thrombocytopenia ([facility])   . 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 ([facility])   . Acute blood loss anemia   . Generalized OA   . Gastroesophageal reflux disease   . Seizure prophylaxis   . Leukocytosis   . Tumor 08/18/2018  . Meningioma determined by biopsy of brain ([facility]) 08/18/2018  . MACULAR DEGENERATION 02/29/2008  . ALLERGY 02/29/2008  . Nonspecific (abnormal) findings on radiological and other examination of body structure 12/26/2007  . NONSPECIFIC ABNORM FIND RAD&OTH EXAM LUNG FIELD 12/26/2007  . GALLSTONES 10/01/2007  . ELEVATION, TRANSAMINASE/LDH LEVELS 09/06/2007  . HEPATITIS NOS 09/01/2007  . ABDOMINAL PAIN, EPIGASTRIC 09/01/2007    [facility] 09/09/2018, 7:20 PM  [facility] [address] [place], [place], [address] Phone: [phone]   Fax:  [phone]  Name: DMARCUS DECICCO MRN: [id number] Date of Birth: [birth year]   Vianne Bulls, OTR/L [facility] [address] [place], [place]  [address] [phone] phone [phone] 09/09/18 7:21 PM

## 2018-09-09 NOTE — Patient Instructions (Signed)
   Tips to help facilitate better attention, concentration, focus   Do harder, longer tasks when you are most alert/awake  Break down larger tasks into small parts  Limit distractions of TV, radio, conversation, e mails/texts, appliance noise, etc - if a job is important, do it in a quiet room  Be aware of how you are functioning in high stimulation environments such as large stores, parties, restaurants - any place with lots of lights, noise, signs etc  Group conversations may be more difficult to process than one on one conversations  Give yourself extra time to process conversation, reading materials, directions or information from your healthcare providers  Organization is key - clutters of laundry, mail, paperwork, dirty dishes - all make it more difficult to concentrate  Before you start a task, have all the needed supplies, directions, recipes ready and organized. This way you don't have to go looking for something in the middle of a task and become distracted.   Be aware of fatigue - take rests or breaks when needed to re-group and re-focus  Energy conservation - if you have an evening event or important meeting/party/dinner rest during the day   3 ring binder with sections for OT/PT/ST  Games for word finding: Taboo, Engineer, production, Scientist, clinical (histocompatibility and immunogenetics), [employer], Family Feud   Cognitive Activities you can do at home:   - [place] (easy level)  - [place]  On your computer, tablet or phone: [employer]

## 2018-09-13 ENCOUNTER — Encounter: Payer: Medicare Other | Attending: Physical Medicine & Rehabilitation | Admitting: Physical Medicine & Rehabilitation

## 2018-09-13 ENCOUNTER — Encounter: Payer: Self-pay | Admitting: Physical Medicine & Rehabilitation

## 2018-09-13 VITALS — BP 119/77 | HR 80 | Resp 79 | Ht 70.0 in | Wt 196.0 lb

## 2018-09-13 DIAGNOSIS — R4701 Aphasia: Secondary | ICD-10-CM | POA: Diagnosis not present

## 2018-09-13 DIAGNOSIS — H409 Unspecified glaucoma: Secondary | ICD-10-CM | POA: Diagnosis not present

## 2018-09-13 DIAGNOSIS — D320 Benign neoplasm of cerebral meninges: Secondary | ICD-10-CM | POA: Diagnosis present

## 2018-09-13 DIAGNOSIS — I69351 Hemiplegia and hemiparesis following cerebral infarction affecting right dominant side: Secondary | ICD-10-CM

## 2018-09-13 DIAGNOSIS — R350 Frequency of micturition: Secondary | ICD-10-CM | POA: Insufficient documentation

## 2018-09-13 DIAGNOSIS — I639 Cerebral infarction, unspecified: Secondary | ICD-10-CM | POA: Insufficient documentation

## 2018-09-13 DIAGNOSIS — R531 Weakness: Secondary | ICD-10-CM | POA: Diagnosis not present

## 2018-09-13 DIAGNOSIS — K219 Gastro-esophageal reflux disease without esophagitis: Secondary | ICD-10-CM | POA: Insufficient documentation

## 2018-09-13 MED ORDER — LEVETIRACETAM 500 MG PO TABS: 500.0000 mg | ORAL_TABLET | Freq: Two times a day (BID) | ORAL | 3 refills | Status: DC

## 2018-09-13 NOTE — Progress Notes (Signed)
Subjective:    Patient ID: James Olsen, male    DOB: [birth year], 72 y.o.   MRN: [id number]  HPI   Patient is here today in follow-up of his left frontal brain infarct/meningioma.  This is a transitional care visit.He has been working on a HEP as well as outpt therapies at [facility]. He reports improvement in his strength and spasticity although his balance is a work in progress. He just started taking steps one at a time.   Sleep has been fair but can be inconsistent. He often has to wake up to urinate. He urinates more in general and feels that his stools are loose (3-4 per day).  His appetite is been good.  Mood is been very upbeat and positive.  He and his wife watch to walk about every day.  This is a way for him to get exercise as well as to socialize with those in his neighborhood.    He remains on baclofen for spasms and keppra for seizure prophylaxis.  He denies frank pain at this point.  He had no further seizure activity.  James Olsen is retired.  he lives at home with his wife.   Pain Inventory Average Pain 0 Pain Right Now 0 My pain is no pain  In the last 24 hours, has pain interfered with the following? General activity 0 Relation with others 0 Enjoyment of life 0 What TIME of day is your pain at its worst? no pain Sleep (in general) Fair  Pain is worse with: no pain Pain improves with: no pain Relief from Meds: no pain  Mobility walk without assistance  Function retired  Neuro/Psych weakness  Prior Studies hospital f/u  Physicians involved in your care hospital f/u   Family History  Problem Relation Age of Onset  . Diabetes Mother   . Skin cancer Brother   . Congenital heart disease Brother    Social History   Socioeconomic History  . Marital status: Married    Spouse name: Not on file  . Number of children: Not on file  . Years of education: Not on file  . Highest education level: Not on file  Occupational History  .  Not on file  Social Needs  . Financial resource strain: Not on file  . Food insecurity:    Worry: Not on file    Inability: Not on file  . Transportation needs:    Medical: Not on file    Non-medical: Not on file  Tobacco Use  . Smoking status: Never Smoker  . Smokeless tobacco: Never Used  Substance and Sexual Activity  . Alcohol use: Yes    Comment: occasionally  . Drug use: No  . Sexual activity: Not on file  Lifestyle  . Physical activity:    Days per week: Not on file    Minutes per session: Not on file  . Stress: Not on file  Relationships  . Social connections:    Talks on phone: Not on file    Gets together: Not on file    Attends religious service: Not on file    Active member of club or organization: Not on file    Attends meetings of clubs or organizations: Not on file    Relationship status: Not on file  Other Topics Concern  . Not on file  Social History Narrative  . Not on file   Past Surgical History:  Procedure Laterality Date  . CHOLECYSTECTOMY  2008   lap  choli  . COLONOSCOPY    . CRANIOTOMY N/A 08/18/2018   Procedure: CRANIOTOMY TUMOR EXCISION;  Surgeon: [name], MD;  Location: [facility];  Service: Neurosurgery;  Laterality: N/A;  CRANIOTOMY TUMOR EXCISION  . NAILBED REPAIR Left 05/04/2014   Procedure: LEFT INDEX NAIL ABLATION V-Y FLAP COVERAGE;  Surgeon: [name]., MD;  Location: [facility];  Service: Orthopedics;  Laterality: Left;  index  . PILONIDAL CYST EXCISION     x2  . TONSILLECTOMY    . VASECTOMY     Past Medical History:  Diagnosis Date  . Arthritis   . GERD (gastroesophageal reflux disease)   . Glaucoma   . Macular degeneration   . Wears glasses    BP 119/77 (BP Location: Left Arm, Patient Position: Sitting, Cuff Size: Normal)   Pulse 80   Resp (!) 79   Ht 5\' 10"  (1.778 m)   Wt 196 lb (88.9 kg)   SpO2 97%   BMI 28.12 kg/m   Opioid Risk Score:   Fall Risk Score:  `1  Depression screen PHQ  2/9  No flowsheet data found.  Review of Systems  Constitutional: Negative.   HENT: Negative.   Eyes: Negative.   Respiratory: Negative.   Cardiovascular: Negative.   Gastrointestinal: Negative.   Endocrine: Negative.   Genitourinary: Negative.   Musculoskeletal: Negative.   Skin: Positive for wound.  Neurological: Positive for weakness.  Hematological: Negative.   Psychiatric/Behavioral: Negative.        Objective:   Physical Exam   General: Alert and oriented x 3, No apparent distress HEENT: Head is normocephalic, atraumatic, PERRLA, EOMI, sclera anicteric, oral mucosa pink and moist, dentition intact, ext ear canals clear,  Neck: Supple without JVD or lymphadenopathy Heart: Reg rate and rhythm. No murmurs rubs or gallops Chest: CTA bilaterally without wheezes, rales, or rhonchi; no distress Abdomen: Soft, non-tender, non-distended, bowel sounds positive. Extremities: No clubbing, cyanosis, or edema. Pulses are 2+ Skin: Clean and intact without signs of breakdown Neuro: Cognitively patient demonstrating normal insight and awareness.  Language is still slightly nonfluent.  There are some word finding deficits.  There are some delays in processing information as well.  Apraxic with movements of his right upper and right lower extremities.  Gait however was very functional with good leg clearance and knee bend as well as weight shift.  He is not using his foot up AFO today.  Strength is grossly 4 to 4+ out of 5 right upper extremity 4+ out of 5 right lower extremity and 5 out of 5 in the left upper and lower extremities.  Sensation was intact to pain and light touch. Musculoskeletal: Full ROM, No pain with AROM or PROM in the neck, trunk, or extremities. Posture appropriate Psych: Pt's affect is appropriate. Pt is cooperative       Assessment & Plan:  1. 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             -Continue with outpatient  therapies at neuro rehab.  Making ongoing gains and language as well as mobility.   -Maintain with home exercises as he is doing.  He remains very aggressive and wife is very supportive in these areas.             2.  Pain Management: Tylenol as needed 3. Seizure prophylaxis: Keppra twice daily 4. Early extensor tone RLE:              -  Baclofen is currently 10 mg twice daily.  Tone is minimal today.  Believe we can taper this to off over 1 weeks time.  Instructions provided.             -Continue stretching and range of motion             -foot up AFO  as needed 5.  Urinary frequency: May have some baseline bladder outlet dysfunction.  Advised he try rationing his fluid a bit where he is drinking less in the evening.  If he continues to have frequency and difficulties we can look into more of a work-up.  There are no signs of infection at this point. 6.  Loose stool: May be functional.  I see no obvious pharmaceutical or medical reason for it.  He may try probiotic or Metamucil to help bulk his stool.  30 minutes was spent with the patient in direct consultation and examination today.  All issues above were discussed in detail.  I will see him back in about 2 months time for follow-up

## 2018-09-13 NOTE — Addendum Note (Signed)
Addended by: Alger Simons T on: 09/13/2018 02:35 PM   Modules accepted: Level of Service

## 2018-09-13 NOTE — Patient Instructions (Addendum)
FOR YOUR BOWELS: TRY PROBIOTIC EITHER BY SUPPLEMENT OR THROUGH DIET (YOGURT)  METAMUCIL CAN HELP BULK YOUR STOOL: 1 PACKET DAILY   BLADDER: 1. TRY RATIONING YOUR FLUID INTAKE AND AVOID DRINKING A LOT AFTER DINNER.    BACLOFEN : TAKE 1/2 TAB TWICE DAILY FOR ONE WEEK THEN STOP

## 2018-09-14 ENCOUNTER — Ambulatory Visit: Payer: Medicare Other | Admitting: Speech Pathology

## 2018-09-14 ENCOUNTER — Ambulatory Visit: Payer: Medicare Other | Admitting: Occupational Therapy

## 2018-09-14 ENCOUNTER — Encounter: Payer: Self-pay | Admitting: Speech Pathology

## 2018-09-14 DIAGNOSIS — I69353 Hemiplegia and hemiparesis following cerebral infarction affecting right non-dominant side: Secondary | ICD-10-CM | POA: Diagnosis not present

## 2018-09-14 DIAGNOSIS — R41841 Cognitive communication deficit: Secondary | ICD-10-CM

## 2018-09-14 DIAGNOSIS — R278 Other lack of coordination: Secondary | ICD-10-CM

## 2018-09-14 DIAGNOSIS — R4701 Aphasia: Secondary | ICD-10-CM

## 2018-09-14 NOTE — Therapy (Signed)
[facility] [address] [place], [place], [address] Phone: [phone]   Fax:  [phone]  Occupational Therapy Treatment  Patient Details  Name: James Olsen MRN: [id number] Date of Birth: Mar 06, 1946 Referring Provider: Dr. Alger Simons   Encounter Date: 09/14/2018  OT End of Session - 09/14/18 0849    Visit Number  3    Number of Visits  25    Date for OT Re-Evaluation  12/06/18    Authorization Type  cert. 09/07/18-12/06/18    Authorization Time Period  Medicare & [employer], covered 100%    Authorization - Visit Number  3    Authorization - Number of Visits  10    OT Start Time  0848    OT Stop Time  0930    OT Time Calculation (min)  42 min       Past Medical History:  Diagnosis Date  . Arthritis   . GERD (gastroesophageal reflux disease)   . Glaucoma   . Macular degeneration   . Wears glasses     Past Surgical History:  Procedure Laterality Date  . CHOLECYSTECTOMY  2008   lap choli  . COLONOSCOPY    . CRANIOTOMY N/A 08/18/2018   Procedure: CRANIOTOMY TUMOR EXCISION;  Surgeon: [name], MD;  Location: [facility];  Service: Neurosurgery;  Laterality: N/A;  CRANIOTOMY TUMOR EXCISION  . NAILBED REPAIR Left 05/04/2014   Procedure: LEFT INDEX NAIL ABLATION V-Y FLAP COVERAGE;  Surgeon: [name]., MD;  Location: [facility];  Service: Orthopedics;  Laterality: Left;  index  . PILONIDAL CYST EXCISION     x2  . TONSILLECTOMY    . VASECTOMY      There were no vitals filed for this visit.  Subjective Assessment - 09/14/18 0848    Pertinent History  Meningioma s/p craniotomy for tumor resection 08/18/18, Acute cerebral infarction; arthritis, macular degeneration, hepatitis, GERD    Patient Stated Goals  be back to normal or 95%    Currently in Pain?  No/denies             Treatment: Seated at table, flipping playing cards with RUE min-mod difficulty, min v.c. Pt attempted  to deal cards and to pick up coins  however he experienced max difficulty, so tasks were discontinued. Placing graded clothespins on vertical antennae in standing min-mod difficulty/ v.c Picking up 1 inch blocks with RUE to place in container, min v.c             OT Education - 09/14/18 1331    Education Details  reviewed ball exercises in supine 2 sets 5 reps each, min v.c facilitation    Person(s) Educated  Patient    Methods  Explanation;Demonstration;Verbal cues;Handout;Tactile cues    Comprehension  Verbalized understanding;Returned demonstration;Verbal cues required       OT Short Term Goals - 09/08/18 2037      OT SHORT TERM GOAL #1   Title  Pt will be independent with initial HEP.--check STG 10/22/18    Time  6    Period  Weeks    Status  New      OT SHORT TERM GOAL #2   Title  Pt will be able to bathe mod I.    Time  6    Period  Weeks    Status  New      OT SHORT TERM GOAL #3   Title  Pt will be able to complete clothing fasteners  mod I.    Time  6    Period  Weeks    Status  New      OT SHORT TERM GOAL #4   Title  Pt will be able to cut food mod I.    Time  6    Period  Weeks    Status  New      OT SHORT TERM GOAL #5   Title  Pt will demo at least 60* R shoulder flex with only min compensation for improved functional reach.    Baseline  45* with mod compensation    Period  Weeks    Status  New      OT SHORT TERM GOAL #6   Title  Pt will be able to use RUE as stabilizer consistently for ADLs.    Time  6    Period  Weeks    Status  New        OT Long Term Goals - 09/08/18 2040      OT LONG TERM GOAL #1   Title  Pt will be independent with updated HEP.--check LTGs 12/06/18    Time  12    Period  Weeks    Status  New      OT LONG TERM GOAL #2   Title  Pt will perform simple cooking tasks mod I.    Time  12    Period  Weeks    Status  New      OT LONG TERM GOAL #3   Title  Pt will perform mod complex home maintenance tasks mod I.     Time  12    Period  Weeks    Status  New      OT LONG TERM GOAL #4   Title  Pt will use RUE as nondominant assist at least 75% of the time for ADLs.    Time  8    Period  Weeks    Status  New      OT LONG TERM GOAL #5   Title  Pt will demo at least 90* R shoulder flex for functional reach with min compensation.    Period  Weeks    Status  New      OT LONG TERM GOAL #6   Title  Pt will be able to carry light bag of groceries with RUE.    Time  12    Period  Weeks    Status  New      OT LONG TERM GOAL #7   Title  Pt will demo improved coordination/RUE functional reach as shown by scoring at least 30 on box and blocks test.    Time  12    Period  Weeks    Status  New      OT LONG TERM GOAL #8   Title  Pt will demo at least 35lbs R grip strength for opening containers/carrying objects.    Baseline  22lbs    Time  12    Period  Weeks    Status  New            Plan - 09/14/18 0849    Clinical Impression Statement  Pt is progressing towards goals. He performs supine ball exercises, with min v.c    Occupational Profile and client history currently impacting functional performance  Pt was very independent and active prior to surgery.  Pt drove, performed yardwork, most cooking/home maintenance tasks, and enjoyed outdoor leisure activities.  Pt is currently not able to drive, perform yardwork, perform previous cooking/home maintenance tasks or leisure tasks.  Pt's goal is to return to 90-100% of "normal"    Occupational performance deficits (Please refer to evaluation for details):  ADL's;IADL's;Social Participation;Leisure    Rehab Potential  Good    OT Frequency  2x / week    OT Duration  12 weeks    OT Treatment/Interventions  Self-care/ADL training;Cryotherapy;Paraffin;Therapeutic exercise;DME and/or AE instruction;Functional Mobility Training;Cognitive remediation/compensation;Balance training;Splinting;Manual Therapy;Neuromuscular  education;Fluidtherapy;Ultrasound;Electrical Stimulation;Aquatic Therapy;Moist Heat;Energy conservation;Passive range of motion;Therapeutic activities;Patient/family education    Plan  neuro re-ed RUE, RUE functional use, wt. bearing    Consulted and Agree with Plan of Care  Patient       Patient will benefit from skilled therapeutic intervention in order to improve the following deficits and impairments:  Abnormal gait, Decreased balance, Decreased endurance, Decreased mobility, Decreased range of motion, Decreased cognition, Decreased activity tolerance, Decreased coordination, Decreased knowledge of use of DME, Decreased strength, Impaired flexibility, Impaired UE functional use  Visit Diagnosis: Hemiplegia and hemiparesis following cerebral infarction affecting right non-dominant side (HCC)  Other lack of coordination    Problem List Patient Active Problem List   Diagnosis Date Noted  . Acute cerebral infarction ([facility])   . Thrombocytopenia ([facility])   . 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 Acute blood loss anemia   . Generalized OA   . Gastroesophageal reflux disease   . Seizure prophylaxis   . Leukocytosis   . Tumor 08/18/2018  . 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 MACULAR DEGENERATION 02/29/2008  . ALLERGY 02/29/2008  . Nonspecific (abnormal) findings on radiological and other examination of body structure 12/26/2007  . NONSPECIFIC ABNORM FIND RAD&OTH EXAM LUNG FIELD 12/26/2007  . GALLSTONES 10/01/2007  . ELEVATION, TRANSAMINASE/LDH LEVELS 09/06/2007  . HEPATITIS NOS 09/01/2007  . ABDOMINAL PAIN, EPIGASTRIC 09/01/2007    Deven Audi 09/14/2018, 1:32 PM  [facility] [address] [place], [place], [address] Phone: [phone]   Fax:  [phone]  Name: James Olsen MRN: [id number] Date of Birth: 1946-03-26

## 2018-09-14 NOTE — Therapy (Signed)
[facility] [address] [place], [place], [address] Phone: [phone]   Fax:  [phone]  Speech Language Pathology Treatment  Patient Details  Name: James Olsen MRN: [id number] Date of Birth: Jan 01, 1946 Referring Provider: Alger Simons MD   Encounter Date: 09/14/2018  End of Session - 09/14/18 0818    Visit Number  3    Number of Visits  17    Date for SLP Re-Evaluation  12/03/18    SLP Start Time  0808    SLP Stop Time   0847    SLP Time Calculation (min)  39 min    Activity Tolerance  Patient tolerated treatment well       Past Medical History:  Diagnosis Date  . Arthritis   . GERD (gastroesophageal reflux disease)   . Glaucoma   . Macular degeneration   . Wears glasses     Past Surgical History:  Procedure Laterality Date  . CHOLECYSTECTOMY  2008   lap choli  . COLONOSCOPY    . CRANIOTOMY N/A 08/18/2018   Procedure: CRANIOTOMY TUMOR EXCISION;  Surgeon: [name], MD;  Location: [facility];  Service: Neurosurgery;  Laterality: N/A;  CRANIOTOMY TUMOR EXCISION  . NAILBED REPAIR Left 05/04/2014   Procedure: LEFT INDEX NAIL ABLATION V-Y FLAP COVERAGE;  Surgeon: [name]., MD;  Location: [facility];  Service: Orthopedics;  Laterality: Left;  index  . PILONIDAL CYST EXCISION     x2  . TONSILLECTOMY    . VASECTOMY      There were no vitals filed for this visit.  Subjective Assessment - 09/14/18 0849    Subjective  "I intentionally try to thing of something and I bog down"            ADULT SLP TREATMENT - 09/14/18 0815      General Information   Behavior/Cognition  Alert;Cooperative;Pleasant mood      Treatment Provided   Treatment provided  Cognitive-Linquistic      Cognitive-Linquistic Treatment   Treatment focused on  Cognition;Aphasia    Skilled Treatment  Patient reports strategies he currently uses for word finding re: circumlocution and word  substitutions. SLP educated patient on more word finding strategies re: gestures, drawing images on paper, writing the portion of the word. During ~10 minute conversation patient demonstrated one episode of word finding difficulty when he demonstrated a delay before spontaneously "finding the word". Targeted word finding in a structured task of generating associated words; pt required occasional min A to generate 8-9 words in each category and for spelling.       Assessment / Recommendations / Plan   Plan  Continue with current plan of care      Progression Toward Goals   Progression toward goals  Progressing toward goals         SLP Short Term Goals - 09/14/18 0835      SLP SHORT TERM GOAL #1   Title  pt will provide sentence responses 18/20 with WFL speech fluidity over three sessions    Time  3    Period  Weeks    Status  On-going      SLP SHORT TERM GOAL #2   Title  pt will undergo formal cognitive linguistic assessment by visit number 3    Time  2    Period  Weeks    Status  Achieved      SLP SHORT TERM GOAL #3   Title  using compensatory strategies pt will functionally participate in 10 minutes simple conversation over 3 sessions    Time  3    Status  On-going       SLP Long Term Goals - 09/14/18 6568      SLP LONG TERM GOAL #1   Title  using compensatory strategies pt will functionally participate in 10 minutes mod complex conversation over 3 sessions    Time  7    Period  Weeks   or 17 sessions   Status  On-going      SLP LONG TERM GOAL #2   Title  pt will functionally perform complex naming tasks with use of compensations over 2 sessions    Baseline  09/09/18    Time  7    Period  Weeks    Status  On-going       Plan - 09/14/18 0819    Clinical Impression Statement  Mild aphasia persists affecting communication with spouse and friends. Pt utilized compensations for anomia spontaneously with success. Langauge is mildly impaired. Pt reports high level  attention impairments. Targeting word finding in complex conversation and generational naming with associated words and categories. Continue skilled ST to maximize communication for independence and QOL.     Speech Therapy Frequency  2x / week    Treatment/Interventions  SLP instruction and feedback;Compensatory strategies;Functional tasks;Cognitive reorganization;Internal/external aids;Patient/family education;Multimodal communcation approach;Environmental controls;Language facilitation;Cueing hierarchy    Potential to Achieve Goals  Good    Consulted and Agree with Plan of Care  Patient       Patient will benefit from skilled therapeutic intervention in order to improve the following deficits and impairments:   Aphasia  Cognitive communication deficit    Problem List Patient Active Problem List   Diagnosis Date Noted  . Acute cerebral infarction ([facility])   . Thrombocytopenia ([facility])   . 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 Acute blood loss anemia   . Generalized OA   . Gastroesophageal reflux disease   . Seizure prophylaxis   . Leukocytosis   . Tumor 08/18/2018  . Meningioma determined by biopsy of brain ([facility]) 08/18/2018  . MACULAR DEGENERATION 02/29/2008  . ALLERGY 02/29/2008  . Nonspecific (abnormal) findings on radiological and other examination of body structure 12/26/2007  . NONSPECIFIC ABNORM FIND RAD&OTH EXAM LUNG FIELD 12/26/2007  . GALLSTONES 10/01/2007  . ELEVATION, TRANSAMINASE/LDH LEVELS 09/06/2007  . HEPATITIS NOS 09/01/2007  . ABDOMINAL PAIN, EPIGASTRIC 09/01/2007   James Olsen H. James Olsen, CCC-SLP Speech Language Pathologist  Wende Bushy 09/14/2018, 8:53 AM  [facility] [address] [place], [place], [address] Phone: [phone]   Fax:  [phone]   Name: [name] MRN: [id number] Date of Birth: [birth year]

## 2018-09-15 ENCOUNTER — Encounter: Payer: Self-pay | Admitting: Physical Therapy

## 2018-09-15 ENCOUNTER — Ambulatory Visit: Payer: Medicare Other | Admitting: Physical Therapy

## 2018-09-15 ENCOUNTER — Ambulatory Visit: Payer: Medicare Other

## 2018-09-15 DIAGNOSIS — M6281 Muscle weakness (generalized): Secondary | ICD-10-CM

## 2018-09-15 DIAGNOSIS — I69353 Hemiplegia and hemiparesis following cerebral infarction affecting right non-dominant side: Secondary | ICD-10-CM | POA: Diagnosis not present

## 2018-09-15 DIAGNOSIS — R41841 Cognitive communication deficit: Secondary | ICD-10-CM

## 2018-09-15 DIAGNOSIS — R2689 Other abnormalities of gait and mobility: Secondary | ICD-10-CM

## 2018-09-15 DIAGNOSIS — R4701 Aphasia: Secondary | ICD-10-CM

## 2018-09-15 DIAGNOSIS — R293 Abnormal posture: Secondary | ICD-10-CM

## 2018-09-15 NOTE — Therapy (Signed)
[facility] [address] [place], [place], [address] Phone: [phone]   Fax:  [phone]  Physical Therapy Evaluation  Patient Details  Name: James Olsen MRN: [id number] Date of Birth: 09/13/1946 Referring Provider: Dr. Alger Simons   Encounter Date: 09/15/2018  PT End of Session - 09/15/18 0853    Visit Number  2    Number of Visits  16    Date for PT Re-Evaluation  11/06/18    Authorization Type  Medicare Part A&B- Medicare progress note every 10th visit    Authorization Time Period  Follow medicare guidelines     PT Start Time  [phone]    PT Stop Time  0932    PT Time Calculation (min)  41 min    Activity Tolerance  Patient tolerated treatment well    Behavior During Therapy  [facility] for tasks assessed/performed       Past Medical History:  Diagnosis Date  . Arthritis   . GERD (gastroesophageal reflux disease)   . Glaucoma   . Macular degeneration   . Wears glasses     Past Surgical History:  Procedure Laterality Date  . CHOLECYSTECTOMY  2008   lap choli  . COLONOSCOPY    . CRANIOTOMY N/A 08/18/2018   Procedure: CRANIOTOMY TUMOR EXCISION;  Surgeon: [name], MD;  Location: [facility];  Service: Neurosurgery;  Laterality: N/A;  CRANIOTOMY TUMOR EXCISION  . NAILBED REPAIR Left 05/04/2014   Procedure: LEFT INDEX NAIL ABLATION V-Y FLAP COVERAGE;  Surgeon: [name]., MD;  Location: [facility];  Service: Orthopedics;  Laterality: Left;  index  . PILONIDAL CYST EXCISION     x2  . TONSILLECTOMY    . VASECTOMY      There were no vitals filed for this visit.   Subjective Assessment - 09/15/18 0852    Subjective  feeling well - feels like balance is some better; has been practicing steps     Pertinent History  OA, GERD, Macular degeneration, Bilateral LE numbness with onset earlier this year 2/2 left frontal meningioma     Patient Stated Goals  Improve motor skills and functional  mobility     Currently in Pain?  No/denies    Pain Score  0-No pain                    Objective measurements completed on examination: See above findings.           Balance Exercises - 09/15/18 0936      Balance Exercises: Standing   Standing Eyes Opened  Narrow base of support (BOS);Head turns;3 reps;30 secs   horizontal and vertical   Standing Eyes Closed  Narrow base of support (BOS);Foam/compliant surface;5 reps;20 secs   moderate sway   Tandem Stance  Eyes open;Intermittent upper extremity support;4 reps;20 secs   head turns - horizontal and vertical   Rockerboard  Anterior/posterior;Head turns;EO;20 seconds;5 reps   no UE support   Step Ups  Forward;6 inch   alternating toe taps - intermittent UE support - Min guard   Tandem Gait  Forward;4 reps   in // bars   Retro Gait  4 reps   in // bars   Sidestepping  --   in // bars - over 5 pebbles focusing on step length       PT Education - 09/15/18 0956    Education Details  HEP initiation, discussion on safety in the home environment  with HEP and balance tasks    Person(s) Educated  Patient    Methods  Explanation;Demonstration;Handout    Comprehension  Verbalized understanding;Returned demonstration       PT Short Term Goals - 09/07/18 1255      PT SHORT TERM GOAL #1   Title  Pt will demonstrate independence and compliancy with HEP to further progress towards improving strength and functional mobility     Baseline  Dependent     Time  4    Period  Weeks    Status  New    Target Date  10/07/18      PT SHORT TERM GOAL #2   Title  Pt will improve Berg Balance Assessment by 3 points indicating minimal detectable change and decreased fall risk    Baseline  46/51 on 9/17    Time  4    Period  Weeks    Status  New    Target Date  10/07/18      PT SHORT TERM GOAL #3   Title  Pt will improve gait speed to 4.08 ft/sec using LRAD and supervision to improve functional mobility     Baseline  3.26  ft/sec on 9/17    Time  4    Period  Weeks    Status  New    Target Date  10/07/18      PT SHORT TERM GOAL #4   Title  Pt will improve FGA score by 4 points to increase dynamic standing during functional gait using no AD    Baseline  12/30 on 9/17    Time  4    Period  Weeks    Status  New    Target Date  10/07/18      PT SHORT TERM GOAL #5   Title  Pt will ambulate 300 feet with LRAD ambulating over uneven surfaces, curbs, ramps, and demonstrate ability to vary gait speed to improve community accessibility safely     Time  4    Period  Weeks    Status  New    Target Date  10/07/18        PT Long Term Goals - 09/07/18 1301      PT LONG TERM GOAL #1   Title  Pt will demonstrate independence with HEP to further progress towards increasing strength and functional mobility     Time  8    Period  Weeks    Status  New    Target Date  11/06/18      PT LONG TERM GOAL #2   Title  Pt will improve Berg Balance Score to >50/56 to decrease fall risk potential     Time  8    Period  Weeks    Status  New    Target Date  10/07/18      PT LONG TERM GOAL #3   Title  Pt will improve FGA score to >17/30 to improve safety and balance during functional mobility with no AD and supervision     Time  8    Period  Weeks    Status  New    Target Date  11/06/18      PT LONG TERM GOAL #4   Title  Pt will ambulate outdoors over uneven surfaces, curbs, ramps, inclines, and demonstrate ability to vary gait speed with LRAD and Mod I to improve safety with community accessibility     Time  8    Period  Weeks  Status  New    Target Date  11/06/18      PT LONG TERM GOAL #5   Title  Pt will negotiate 12 steps using single HR and recirprocal stepping technique, mod I to increase safety and independence with community accessibility indicating improvement in LE strength    Time  8    Period  Weeks    Status  New    Target Date  11/06/18      Additional Long Term Goals   Additional Long Term  Goals  Yes      PT LONG TERM GOAL #6   Title  Pt will improve gait speed to >/= to 4.37 ft/sec indicating normal walking speed with Mod I and LRAD to increase independence with functional mobility     Time  8    Period  Weeks    Status  New    Target Date  11/06/18             Plan - 09/15/18 0854    Clinical Impression Statement  Skilled PT session today focusing on high level balance activities and stepping activites as patient demonstrated poor foot clearance and and difficulty with spatial awareness of R LE at initial eval. Patient requiring min guard throughout session and up to [place] A due to [place] with sidestepping and alternating toe taps. Does require verbal cueing for awareness of LE and motor control. Established HEP today for items covered in session that he can safely do at home with good carryover.     Rehab Potential  Good    Clinical Impairments Affecting Rehab Potential  R sided weakness with difficulty negotiating obstacles and performing high level balance    PT Frequency  2x / week    PT Duration  8 weeks    PT Treatment/Interventions  ADLs/Self Care Home Management;Gait training;Neuromuscular re-education;Functional mobility training;Stair training;Cognitive remediation;Energy conservation;Patient/family education;Therapeutic activities;Therapeutic exercise;Orthotic Fit/Training;Balance training;DME Instruction    PT Next Visit Plan  high level balance, gait with large steps, side stepping    PT Home Exercise Plan  [id number]    Consulted and Agree with Plan of Care  Patient       Patient will benefit from skilled therapeutic intervention in order to improve the following deficits and impairments:  Abnormal gait, Decreased cognition, Decreased knowledge of use of DME, Improper body mechanics, Decreased mobility, Postural dysfunction, Decreased strength, Impaired perceived functional ability, Impaired UE functional use, Decreased balance, Decreased safety awareness,  Difficulty walking  Visit Diagnosis: Other abnormalities of gait and mobility  Abnormal posture  Muscle weakness (generalized)     Problem List Patient Active Problem List   Diagnosis Date Noted  . Acute cerebral infarction ([facility])   . Thrombocytopenia ([facility])   . 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 Acute blood loss anemia   . Generalized OA   . Gastroesophageal reflux disease   . Seizure prophylaxis   . Leukocytosis   . Tumor 08/18/2018  . Meningioma determined by biopsy of brain ([facility]) 08/18/2018  . MACULAR DEGENERATION 02/29/2008  . ALLERGY 02/29/2008  . Nonspecific (abnormal) findings on radiological and other examination of body structure 12/26/2007  . NONSPECIFIC ABNORM FIND RAD&OTH EXAM LUNG FIELD 12/26/2007  . GALLSTONES 10/01/2007  . ELEVATION, TRANSAMINASE/LDH LEVELS 09/06/2007  . HEPATITIS NOS 09/01/2007  . ABDOMINAL PAIN, EPIGASTRIC 09/01/2007     Lanney Gins, PT, DPT Supplemental Physical Therapist 09/15/18 9:58 AM Pager: [phone] Office: [phone]  [facility] [address] [place], [place], [address] Phone: [phone]   Fax:  [phone]  Name: James Olsen MRN: [id number] Date of Birth: September 02, 1946

## 2018-09-15 NOTE — Therapy (Signed)
[facility] [address] [place], [place], [address] Phone: [phone]   Fax:  [phone]  Speech Language Pathology Treatment  Patient Details  Name: James Olsen MRN: [id number] Date of Birth: 1946-01-26 Referring Provider: Alger Simons MD   Encounter Date: 09/15/2018  End of Session - 09/15/18 1703    Visit Number  4    Number of Visits  17    Date for SLP Re-Evaluation  12/03/18    SLP Start Time  0803    SLP Stop Time   0845    SLP Time Calculation (min)  42 min    Activity Tolerance  Patient tolerated treatment well       Past Medical History:  Diagnosis Date  . Arthritis   . GERD (gastroesophageal reflux disease)   . Glaucoma   . Macular degeneration   . Wears glasses     Past Surgical History:  Procedure Laterality Date  . CHOLECYSTECTOMY  2008   lap choli  . COLONOSCOPY    . CRANIOTOMY N/A 08/18/2018   Procedure: CRANIOTOMY TUMOR EXCISION;  Surgeon: [name], MD;  Location: [facility];  Service: Neurosurgery;  Laterality: N/A;  CRANIOTOMY TUMOR EXCISION  . NAILBED REPAIR Left 05/04/2014   Procedure: LEFT INDEX NAIL ABLATION V-Y FLAP COVERAGE;  Surgeon: [name]., MD;  Location: [facility];  Service: Orthopedics;  Laterality: Left;  index  . PILONIDAL CYST EXCISION     x2  . TONSILLECTOMY    . VASECTOMY      There were no vitals filed for this visit.  Subjective Assessment - 09/15/18 0810    Subjective  Pt  denies difficulty with reading comprehension.     Currently in Pain?  No/denies            ADULT SLP TREATMENT - 09/15/18 0810      General Information   Behavior/Cognition  Alert;Cooperative;Pleasant mood      Treatment Provided   Treatment provided  Cognitive-Linquistic      Cognitive-Linquistic Treatment   Treatment focused on  Cognition    Skilled Treatment  Pt relates to SLP that he is aware that he's "not with it" at night, as much as  he is in the morning. Pt was asked how this knowledge will play out in how he structures his day and pt sttated he would be "more planful", and would also take a nap. SLP reviewed the non-verbal means to compensate for word finding, and pt req'd min cues to recall any non-verbal cues after naming three verbal (two of which he is currently using). In pt's homework, words were mis-spelled. Pt was aware of errors, when told by SLP. SLP further worked with pt's error awareness and higher level attention in a simple but detailed task. Pt with alternating attention in this simple detailed task as [facility] - pt took what SLP thought was slightly longer than usual for return to written task when asked questions needing a response. It appeared as if pt were unable to divide attention between written work and simple verbal answers. However pt's attention appeared lacking, as pt asked SLP a specific question thta was already provided in the information given to pt on the paper. Overall success with this task without SLP cues for details was 60%.      Assessment / Recommendations / Plan   Plan  Continue with current plan of care      Progression Toward Goals  Progression toward goals  Progressing toward goals         SLP Short Term Goals - 09/15/18 1703      SLP SHORT TERM GOAL #1   Title  pt will provide sentence responses 18/20 with WFL speech fluidity over three sessions    Time  3    Period  Weeks    Status  On-going      SLP SHORT TERM GOAL #2   Title  pt will undergo formal cognitive linguistic assessment by visit number 3    Status  Achieved      SLP SHORT TERM GOAL #3   Title  using compensatory strategies pt will functionally participate in 10 minutes simple conversation over 3 sessions    Baseline  09-15-18    Time  3    Status  On-going       SLP Long Term Goals - 09/15/18 1704      SLP LONG TERM GOAL #1   Title  using compensatory strategies pt will functionally participate in 10  minutes mod complex conversation over 3 sessions    Time  7    Period  Weeks   or 17 sessions   Status  On-going      SLP LONG TERM GOAL #2   Title  pt will functionally perform complex naming tasks with use of compensations over 2 sessions    Baseline  09/09/18    Time  7    Period  Weeks    Status  On-going       Plan - 09/15/18 0839    Clinical Impression Statement  Pt's langauge remains mildly impaired. Pt demonstrated high level attention and awareness impairments today in a simple but detailed written task. Continue skilled ST to maximize communication and higher level cognition for independence and QOL.     Speech Therapy Frequency  2x / week    Duration  --   8 weeks/17 visits   Treatment/Interventions  SLP instruction and feedback;Compensatory strategies;Functional tasks;Cognitive reorganization;Internal/external aids;Patient/family education;Multimodal communcation approach;Environmental controls;Language facilitation;Cueing hierarchy    Potential to Achieve Goals  Good    Consulted and Agree with Plan of Care  Patient       Patient will benefit from skilled therapeutic intervention in order to improve the following deficits and impairments:   Cognitive communication deficit  Aphasia    Problem List Patient Active Problem List   Diagnosis Date Noted  . Acute cerebral infarction ([facility])   . Thrombocytopenia ([facility])   . 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 Acute blood loss anemia   . Generalized OA   . Gastroesophageal reflux disease   . Seizure prophylaxis   . Leukocytosis   . Tumor 08/18/2018  . 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 MACULAR DEGENERATION 02/29/2008  . ALLERGY 02/29/2008  . Nonspecific (abnormal) findings on radiological and other examination of body structure 12/26/2007  . NONSPECIFIC ABNORM FIND RAD&OTH EXAM LUNG FIELD 12/26/2007  . GALLSTONES 10/01/2007  . ELEVATION,  TRANSAMINASE/LDH LEVELS 09/06/2007  . HEPATITIS NOS 09/01/2007  . ABDOMINAL PAIN, EPIGASTRIC 09/01/2007    James Olsen ,[place], CCC-SLP  09/15/2018, 5:05 PM  [facility] [address] [place], [place], [address] Phone: [phone]   Fax:  [phone]   Name: James Olsen MRN: [id number] Date of Birth: [birth year]

## 2018-09-17 ENCOUNTER — Ambulatory Visit: Payer: Medicare Other | Admitting: Occupational Therapy

## 2018-09-17 ENCOUNTER — Ambulatory Visit: Payer: Medicare Other | Admitting: Physical Therapy

## 2018-09-17 ENCOUNTER — Encounter: Payer: Self-pay | Admitting: Physical Therapy

## 2018-09-17 DIAGNOSIS — R293 Abnormal posture: Secondary | ICD-10-CM

## 2018-09-17 DIAGNOSIS — I69353 Hemiplegia and hemiparesis following cerebral infarction affecting right non-dominant side: Secondary | ICD-10-CM | POA: Diagnosis not present

## 2018-09-17 DIAGNOSIS — M6281 Muscle weakness (generalized): Secondary | ICD-10-CM

## 2018-09-17 DIAGNOSIS — R2689 Other abnormalities of gait and mobility: Secondary | ICD-10-CM

## 2018-09-17 DIAGNOSIS — R278 Other lack of coordination: Secondary | ICD-10-CM

## 2018-09-17 NOTE — Therapy (Signed)
[facility] [address] [place], [place], [address] Phone: [phone]   Fax:  [phone]  Physical Therapy Treatment  Patient Details  Name: James Olsen MRN: [id number] Date of Birth: [birth year] Referring Provider (PT): Dr. Alger Simons   Encounter Date: 09/17/2018  PT End of Session - 09/17/18 1327    Visit Number  3    Number of Visits  16    Date for PT Re-Evaluation  11/06/18    Authorization Type  Medicare Part A&B- Medicare progress note every 10th visit    Authorization Time Period  Follow medicare guidelines     PT Start Time  1100    PT Stop Time  1145    PT Time Calculation (min)  45 min    Equipment Utilized During Treatment  Gait belt    Activity Tolerance  Patient tolerated treatment well    Behavior During Therapy  [facility] for tasks assessed/performed       Past Medical History:  Diagnosis Date  . Arthritis   . GERD (gastroesophageal reflux disease)   . Glaucoma   . Macular degeneration   . Wears glasses     Past Surgical History:  Procedure Laterality Date  . CHOLECYSTECTOMY  2008   lap choli  . COLONOSCOPY    . CRANIOTOMY N/A 08/18/2018   Procedure: CRANIOTOMY TUMOR EXCISION;  Surgeon: [name], MD;  Location: [facility];  Service: Neurosurgery;  Laterality: N/A;  CRANIOTOMY TUMOR EXCISION  . NAILBED REPAIR Left 05/04/2014   Procedure: LEFT INDEX NAIL ABLATION V-Y FLAP COVERAGE;  Surgeon: [name]., MD;  Location: [facility];  Service: Orthopedics;  Laterality: Left;  index  . PILONIDAL CYST EXCISION     x2  . TONSILLECTOMY    . VASECTOMY      There were no vitals filed for this visit.  Subjective Assessment - 09/17/18 1102    Subjective  Pt reports his HEP has been going well. Feels like his balance is relatively the same and could be better. Pt reports no falls or stumbling episodes.     Pertinent History  OA, GERD, Macular degeneration, Bilateral LE  numbness with onset earlier this year 2/2 left frontal meningioma     Patient Stated Goals  Improve motor skills and functional mobility     Currently in Pain?  No/denies    Pain Score  0-No pain                       [facility] Adult PT Treatment/Exercise - 09/17/18 1318      Transfers   Transfers  Sit to Stand;Stand to Sit    Sit to Stand  6: Modified independent (Device/Increase time)    Stand to Sit  6: Modified independent (Device/Increase time)    Comments  Pt performs STS and transitional movements with increased time to perform task       High Level Balance   High Level Balance Activities  Tandem walking;Side stepping;Backward walking;Head turns    High Level Balance Comments  Pt performs anterograde/retrograde ambulation & tandem walking on compliant red mat in // with therapist providing close supervision. Therapist progressed patient to tandem walking forwards and backwards for multiple trials on blue foam for increased challenge with more compliant surface. Pt then performs side stepping on foam for x3 trials demonstrating increased challenge with ankle strategy utilization. Pt progresses to side stepping on foam with eyes closed to challenge  his vestibular system. Pt demonstrates increased in ankle and hip strategy with intermittant UE support on // bars. Therapist provided min A to resteady when limits of stability exceeded too far beyond his BOS. Pt progressed to alternating cone tapping on blue mat for multiple trials then Pt performs double tapping each cone to increase stance duration for increased hip musculature activation. Pt demonstrates increased sway with standing on his L LE with improved R LE spatial awareness. Therapist notes increased weakness to L glut med with positive trendelenberg sign. Pt progresses to lateral & mid line crossing cone tapping for increased challenge with dynamic balance.       Exercises   Exercises  Knee/Hip      Knee/Hip Exercises:  Standing   Hip Abduction  Stengthening;Both;3 sets    Abduction Limitations  Pt performs multiple trials of side stepping in // bars with green theraband to distal femor for increased resistance. Pt requires VC's to maintain proper posture with exercise performance demonstrating good carry over during subsequent trials.              PT Education - 09/17/18 1326    Education Details  Therapist provides education on updated HEP strengthening exercise and to continue performing balancing HEP exercises at home to reduce fall risk potential.     Person(s) Educated  Patient    Methods  Explanation    Comprehension  Verbalized understanding;Returned demonstration       PT Short Term Goals - 09/07/18 1255      PT SHORT TERM GOAL #1   Title  Pt will demonstrate independence and compliancy with HEP to further progress towards improving strength and functional mobility     Baseline  Dependent     Time  4    Period  Weeks    Status  New    Target Date  10/07/18      PT SHORT TERM GOAL #2   Title  Pt will improve Berg Balance Assessment by 3 points indicating minimal detectable change and decreased fall risk    Baseline  46/51 on 9/17    Time  4    Period  Weeks    Status  New    Target Date  10/07/18      PT SHORT TERM GOAL #3   Title  Pt will improve gait speed to 4.08 ft/sec using LRAD and supervision to improve functional mobility     Baseline  3.26 ft/sec on 9/17    Time  4    Period  Weeks    Status  New    Target Date  10/07/18      PT SHORT TERM GOAL #4   Title  Pt will improve FGA score by 4 points to increase dynamic standing during functional gait using no AD    Baseline  12/30 on 9/17    Time  4    Period  Weeks    Status  New    Target Date  10/07/18      PT SHORT TERM GOAL #5   Title  Pt will ambulate 300 feet with LRAD ambulating over uneven surfaces, curbs, ramps, and demonstrate ability to vary gait speed to improve community accessibility safely     Time   4    Period  Weeks    Status  New    Target Date  10/07/18        PT Long Term Goals - 09/07/18 1301  PT LONG TERM GOAL #1   Title  Pt will demonstrate independence with HEP to further progress towards increasing strength and functional mobility     Time  8    Period  Weeks    Status  New    Target Date  11/06/18      PT LONG TERM GOAL #2   Title  Pt will improve Berg Balance Score to >50/56 to decrease fall risk potential     Time  8    Period  Weeks    Status  New    Target Date  10/07/18      PT LONG TERM GOAL #3   Title  Pt will improve FGA score to >17/30 to improve safety and balance during functional mobility with no AD and supervision     Time  8    Period  Weeks    Status  New    Target Date  11/06/18      PT LONG TERM GOAL #4   Title  Pt will ambulate outdoors over uneven surfaces, curbs, ramps, inclines, and demonstrate ability to vary gait speed with LRAD and Mod I to improve safety with community accessibility     Time  8    Period  Weeks    Status  New    Target Date  11/06/18      PT LONG TERM GOAL #5   Title  Pt will negotiate 12 steps using single HR and recirprocal stepping technique, mod I to increase safety and independence with community accessibility indicating improvement in LE strength    Time  8    Period  Weeks    Status  New    Target Date  11/06/18      Additional Long Term Goals   Additional Long Term Goals  Yes      PT LONG TERM GOAL #6   Title  Pt will improve gait speed to >/= to 4.37 ft/sec indicating normal walking speed with Mod I and LRAD to increase independence with functional mobility     Time  8    Period  Weeks    Status  New    Target Date  11/06/18            Plan - 09/17/18 1327    Clinical Impression Statement  Skilled PT session focused on high level balance activities with patient demonstrating improvement in R spatial awareness demonstrated by his accuracy and motor control during dynamic balance  activities. Patient demonstrates good self recovery during challenging balancing exercises demonstrated by proper utilization of ankle, hip, and stepping strategies. Pt will continue to benefit from skill therapy to address strengthening and balance deficits in order to improve functional independence.    Rehab Potential  Good    Clinical Impairments Affecting Rehab Potential  R sided weakness with difficulty negotiating obstacles and performing high level balance    PT Frequency  2x / week    PT Duration  8 weeks    PT Treatment/Interventions  ADLs/Self Care Home Management;Gait training;Neuromuscular re-education;Functional mobility training;Stair training;Cognitive remediation;Energy conservation;Patient/family education;Therapeutic activities;Therapeutic exercise;Orthotic Fit/Training;Balance training;DME Instruction    PT Next Visit Plan  high level balance, rocker board with cone tapping, foam with pertubations, bosu ball for hip strategy, ramp with compliant surface, step ups, eccentric quads, glut med strengthening, STS with L LE staggered, bridging, obstacle course/ compliant surface with items under, community ambulation, hurdles, SLS with cone tapping all around,     PT Home  Exercise Plan  [id number] & [id number]    Consulted and Agree with Plan of Care  Patient       Patient will benefit from skilled therapeutic intervention in order to improve the following deficits and impairments:  Abnormal gait, Decreased cognition, Decreased knowledge of use of DME, Improper body mechanics, Decreased mobility, Postural dysfunction, Decreased strength, Impaired perceived functional ability, Impaired UE functional use, Decreased balance, Decreased safety awareness, Difficulty walking  Visit Diagnosis: Muscle weakness (generalized)  Abnormal posture  Other abnormalities of gait and mobility     Problem List Patient Active Problem List   Diagnosis Date Noted  . Other abnormalities of gait and  mobility 09/17/2018  . Acute cerebral infarction ([facility])   . Thrombocytopenia ([facility])   . 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 Acute blood loss anemia   . Generalized OA   . Gastroesophageal reflux disease   . Seizure prophylaxis   . Leukocytosis   . Tumor 08/18/2018  . 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) 08/18/2018  . MACULAR DEGENERATION 02/29/2008  . ALLERGY 02/29/2008  . Nonspecific (abnormal) findings on radiological and other examination of body structure 12/26/2007  . NONSPECIFIC ABNORM FIND RAD&OTH EXAM LUNG FIELD 12/26/2007  . GALLSTONES 10/01/2007  . ELEVATION, TRANSAMINASE/LDH LEVELS 09/06/2007  . HEPATITIS NOS 09/01/2007  . ABDOMINAL PAIN, EPIGASTRIC 09/01/2007    Floreen Comber, SPT 09/17/2018, 1:32 PM  [facility] [address] [place], [place], [address] Phone: [phone]   Fax:  [phone]  Name: MARVEL MCPHILLIPS MRN: [id number] Date of Birth: 01/12/1946

## 2018-09-17 NOTE — Therapy (Signed)
[facility] [address] [place], [place], [address] Phone: [phone]   Fax:  [phone]  Occupational Therapy Treatment  Patient Details  Name: James Olsen MRN: [id number] Date of Birth: Nov 02, 1946 No data recorded  Encounter Date: 09/17/2018  OT End of Session - 09/17/18 1023    Visit Number  4    Number of Visits  25    Date for OT Re-Evaluation  12/06/18    Authorization Type  cert. 09/07/18-12/06/18    Authorization Time Period  Medicare & [employer], covered 100%    Authorization - Visit Number  4    Authorization - Number of Visits  10    OT Start Time  1022    OT Stop Time  1100    OT Time Calculation (min)  38 min    Activity Tolerance  Patient tolerated treatment well    Behavior During Therapy  WFL for tasks assessed/performed       Past Medical History:  Diagnosis Date  . Arthritis   . GERD (gastroesophageal reflux disease)   . Glaucoma   . Macular degeneration   . Wears glasses     Past Surgical History:  Procedure Laterality Date  . CHOLECYSTECTOMY  2008   lap choli  . COLONOSCOPY    . CRANIOTOMY N/A 08/18/2018   Procedure: CRANIOTOMY TUMOR EXCISION;  Surgeon: [name], MD;  Location: [facility];  Service: Neurosurgery;  Laterality: N/A;  CRANIOTOMY TUMOR EXCISION  . NAILBED REPAIR Left 05/04/2014   Procedure: LEFT INDEX NAIL ABLATION V-Y FLAP COVERAGE;  Surgeon: [name]., MD;  Location: [facility];  Service: Orthopedics;  Laterality: Left;  index  . PILONIDAL CYST EXCISION     x2  . TONSILLECTOMY    . VASECTOMY      There were no vitals filed for this visit.  Subjective Assessment - 09/17/18 1124    Pertinent History  Meningioma s/p craniotomy for tumor resection 08/18/18, Acute cerebral infarction; arthritis, macular degeneration, hepatitis, GERD    Patient Stated Goals  be back to normal or 95%    Currently in Pain?  No/denies            Treatment:  Seated edge of mat closed chain shoulder flexion and chest press with medium ball , min facililtation v. c Pt practiced cutting food using bilateral UE's, pt performed with min-mod difficulty. Therapist added a foam grip to knife and pt demonstrated increased ease with task Pt performed simulated eating task with foam gip on spoon, min v.c with pt reporting increased ease with task using RUE. Pt practiced tying a shoe with increased time and min-mod difficulty pt was able to complete task.                  OT Short Term Goals - 09/08/18 2037      OT SHORT TERM GOAL #1   Title  Pt will be independent with initial HEP.--check STG 10/22/18    Time  6    Period  Weeks    Status  New      OT SHORT TERM GOAL #2   Title  Pt will be able to bathe mod I.    Time  6    Period  Weeks    Status  New      OT SHORT TERM GOAL #3   Title  Pt will be able to complete clothing fasteners mod I.    Time  6    Period  Weeks    Status  New      OT SHORT TERM GOAL #4   Title  Pt will be able to cut food mod I.    Time  6    Period  Weeks    Status  New      OT SHORT TERM GOAL #5   Title  Pt will demo at least 60* R shoulder flex with only min compensation for improved functional reach.    Baseline  45* with mod compensation    Period  Weeks    Status  New      OT SHORT TERM GOAL #6   Title  Pt will be able to use RUE as stabilizer consistently for ADLs.    Time  6    Period  Weeks    Status  New        OT Long Term Goals - 09/08/18 2040      OT LONG TERM GOAL #1   Title  Pt will be independent with updated HEP.--check LTGs 12/06/18    Time  12    Period  Weeks    Status  New      OT LONG TERM GOAL #2   Title  Pt will perform simple cooking tasks mod I.    Time  12    Period  Weeks    Status  New      OT LONG TERM GOAL #3   Title  Pt will perform mod complex home maintenance tasks mod I.    Time  12    Period  Weeks    Status  New      OT LONG TERM GOAL #4    Title  Pt will use RUE as nondominant assist at least 75% of the time for ADLs.    Time  8    Period  Weeks    Status  New      OT LONG TERM GOAL #5   Title  Pt will demo at least 90* R shoulder flex for functional reach with min compensation.    Period  Weeks    Status  New      OT LONG TERM GOAL #6   Title  Pt will be able to carry light bag of groceries with RUE.    Time  12    Period  Weeks    Status  New      OT LONG TERM GOAL #7   Title  Pt will demo improved coordination/RUE functional reach as shown by scoring at least 30 on box and blocks test.    Time  12    Period  Weeks    Status  New      OT LONG TERM GOAL #8   Title  Pt will demo at least 35lbs R grip strength for opening containers/carrying objects.    Baseline  22lbs    Time  12    Period  Weeks    Status  New            Plan - 09/17/18 1120    Clinical Impression Statement  Pt is progressing towards goals. He demonstrates improved ability to perform ADLS.    Occupational Profile and client history currently impacting functional performance  Pt was very independent and active prior to surgery.  Pt drove, performed yardwork, most cooking/home maintenance tasks, and enjoyed outdoor leisure activities.  Pt is currently not able to drive,  perform yardwork, perform previous cooking/home maintenance tasks or leisure tasks.  Pt's goal is to return to 90-100% of "normal"    Occupational performance deficits (Please refer to evaluation for details):  ADL's;IADL's;Social Participation;Leisure    Rehab Potential  Good    OT Frequency  2x / week    OT Duration  12 weeks    OT Treatment/Interventions  Self-care/ADL training;Cryotherapy;Paraffin;Therapeutic exercise;DME and/or AE instruction;Functional Mobility Training;Cognitive remediation/compensation;Balance training;Splinting;Manual Therapy;Neuromuscular education;Fluidtherapy;Ultrasound;Electrical Stimulation;Aquatic Therapy;Moist Heat;Energy conservation;Passive  range of motion;Therapeutic activities;Patient/family education    Plan  neuro re-ed RUE, RUE functional use, wt. bearing    Consulted and Agree with Plan of Care  Patient       Patient will benefit from skilled therapeutic intervention in order to improve the following deficits and impairments:  Abnormal gait, Decreased balance, Decreased endurance, Decreased mobility, Decreased range of motion, Decreased cognition, Decreased activity tolerance, Decreased coordination, Decreased knowledge of use of DME, Decreased strength, Impaired flexibility, Impaired UE functional use  Visit Diagnosis: Other abnormalities of gait and mobility  Abnormal posture  Muscle weakness (generalized)  Hemiplegia and hemiparesis following cerebral infarction affecting right non-dominant side (HCC)  Other lack of coordination    Problem List Patient Active Problem List   Diagnosis Date Noted  . Acute cerebral infarction ([facility])   . Thrombocytopenia ([facility])   . 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 Acute blood loss anemia   . Generalized OA   . Gastroesophageal reflux disease   . Seizure prophylaxis   . Leukocytosis   . Tumor 08/18/2018  . 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 MACULAR DEGENERATION 02/29/2008  . ALLERGY 02/29/2008  . Nonspecific (abnormal) findings on radiological and other examination of body structure 12/26/2007  . NONSPECIFIC ABNORM FIND RAD&OTH EXAM LUNG FIELD 12/26/2007  . GALLSTONES 10/01/2007  . ELEVATION, TRANSAMINASE/LDH LEVELS 09/06/2007  . HEPATITIS NOS 09/01/2007  . ABDOMINAL PAIN, EPIGASTRIC 09/01/2007    James Olsen 09/17/2018, 11:25 AM  [facility] [address] [place], [place], [address] Phone: [phone]   Fax:  [phone]  Name: James Olsen MRN: [id number] Date of Birth: [birth year]

## 2018-09-17 NOTE — Patient Instructions (Signed)
Access Code: [email or web address]  URL: [email or web address]  Date: 09/17/2018    Exercises  Side Stepping with Resistance at Thighs - 5 reps - 3 sets - 1x daily - 7x weekly

## 2018-09-20 ENCOUNTER — Ambulatory Visit (HOSPITAL_COMMUNITY)
Admission: RE | Admit: 2018-09-20 | Discharge: 2018-09-20 | Disposition: A | Discharge status: Home / Self Care | Payer: Medicare Other | Source: Ambulatory Visit | Attending: Neurosurgery | Admitting: Neurosurgery

## 2018-09-20 DIAGNOSIS — D329 Benign neoplasm of meninges, unspecified: Secondary | ICD-10-CM

## 2018-09-20 DIAGNOSIS — R600 Localized edema: Secondary | ICD-10-CM | POA: Insufficient documentation

## 2018-09-20 DIAGNOSIS — D330 Benign neoplasm of brain, supratentorial: Secondary | ICD-10-CM | POA: Insufficient documentation

## 2018-09-21 ENCOUNTER — Ambulatory Visit: Payer: Medicare Other

## 2018-09-21 ENCOUNTER — Ambulatory Visit: Payer: Medicare Other | Attending: Physical Medicine & Rehabilitation | Admitting: Occupational Therapy

## 2018-09-21 DIAGNOSIS — R4701 Aphasia: Secondary | ICD-10-CM | POA: Diagnosis present

## 2018-09-21 DIAGNOSIS — R41841 Cognitive communication deficit: Secondary | ICD-10-CM | POA: Diagnosis present

## 2018-09-21 DIAGNOSIS — R293 Abnormal posture: Secondary | ICD-10-CM | POA: Diagnosis present

## 2018-09-21 DIAGNOSIS — R2689 Other abnormalities of gait and mobility: Secondary | ICD-10-CM | POA: Diagnosis present

## 2018-09-21 DIAGNOSIS — I69353 Hemiplegia and hemiparesis following cerebral infarction affecting right non-dominant side: Secondary | ICD-10-CM | POA: Insufficient documentation

## 2018-09-21 DIAGNOSIS — M6281 Muscle weakness (generalized): Secondary | ICD-10-CM | POA: Diagnosis not present

## 2018-09-21 DIAGNOSIS — R2681 Unsteadiness on feet: Secondary | ICD-10-CM | POA: Diagnosis present

## 2018-09-21 DIAGNOSIS — R278 Other lack of coordination: Secondary | ICD-10-CM

## 2018-09-21 NOTE — Therapy (Signed)
[facility] [address] [place], [place], [address] Phone: [phone]   Fax:  [phone]  Occupational Therapy Treatment  Patient Details  Name: James Olsen MRN: [id number] Date of Birth: Mar 03, 1946 No data recorded  Encounter Date: 09/21/2018  OT End of Session - 09/21/18 0859    Visit Number  5    Number of Visits  25    Date for OT Re-Evaluation  12/06/18    Authorization Type  cert. 09/07/18-12/06/18    Authorization Time Period  Medicare & [employer], covered 100%    Authorization - Visit Number  5    Authorization - Number of Visits  10    OT Start Time  0845    OT Stop Time  0930    OT Time Calculation (min)  45 min    Activity Tolerance  Patient tolerated treatment well    Behavior During Therapy  WFL for tasks assessed/performed       Past Medical History:  Diagnosis Date  . Arthritis   . GERD (gastroesophageal reflux disease)   . Glaucoma   . Macular degeneration   . Wears glasses     Past Surgical History:  Procedure Laterality Date  . CHOLECYSTECTOMY  2008   lap choli  . COLONOSCOPY    . CRANIOTOMY N/A 08/18/2018   Procedure: CRANIOTOMY TUMOR EXCISION;  Surgeon: [name], MD;  Location: [facility];  Service: Neurosurgery;  Laterality: N/A;  CRANIOTOMY TUMOR EXCISION  . NAILBED REPAIR Left 05/04/2014   Procedure: LEFT INDEX NAIL ABLATION V-Y FLAP COVERAGE;  Surgeon: [name]., MD;  Location: [facility];  Service: Orthopedics;  Laterality: Left;  index  . PILONIDAL CYST EXCISION     x2  . TONSILLECTOMY    . VASECTOMY      There were no vitals filed for this visit.  Subjective Assessment - 09/21/18 0859    Pertinent History  Meningioma s/p craniotomy for tumor resection 08/18/18, Acute cerebral infarction; arthritis, macular degeneration, hepatitis, GERD    Patient Stated Goals  be back to normal or 95%    Currently in Pain?  No/denies               Treatment:Quadraped for cat/ cow, then tall kneeling rolling ball forwards and backwards, and over ball in quadraped lifting alternating UE's then LE's min facilitation for weightbearing, and core stability. Seated at table, flipping and dealing playing cards with RUE, min difficulty, min v.c to avoid compensation. Placing various sized pegs into pegboard  With RUE, min-mod difficulty/ v.c, shelf liner used to increase ease with picking up small pegs , followed by removing pegs with RUE. Arm bike x 6 mins level 3 for reciprocal movement/ conditioning.               OT Short Term Goals - 09/08/18 2037      OT SHORT TERM GOAL #1   Title  Pt will be independent with initial HEP.--check STG 10/22/18    Time  6    Period  Weeks    Status  New      OT SHORT TERM GOAL #2   Title  Pt will be able to bathe mod I.    Time  6    Period  Weeks    Status  New      OT SHORT TERM GOAL #3   Title  Pt will be able to complete clothing fasteners mod I.  Time  6    Period  Weeks    Status  New      OT SHORT TERM GOAL #4   Title  Pt will be able to cut food mod I.    Time  6    Period  Weeks    Status  New      OT SHORT TERM GOAL #5   Title  Pt will demo at least 60* R shoulder flex with only min compensation for improved functional reach.    Baseline  45* with mod compensation    Period  Weeks    Status  New      OT SHORT TERM GOAL #6   Title  Pt will be able to use RUE as stabilizer consistently for ADLs.    Time  6    Period  Weeks    Status  New        OT Long Term Goals - 09/08/18 2040      OT LONG TERM GOAL #1   Title  Pt will be independent with updated HEP.--check LTGs 12/06/18    Time  12    Period  Weeks    Status  New      OT LONG TERM GOAL #2   Title  Pt will perform simple cooking tasks mod I.    Time  12    Period  Weeks    Status  New      OT LONG TERM GOAL #3   Title  Pt will perform mod complex home maintenance tasks mod I.     Time  12    Period  Weeks    Status  New      OT LONG TERM GOAL #4   Title  Pt will use RUE as nondominant assist at least 75% of the time for ADLs.    Time  8    Period  Weeks    Status  New      OT LONG TERM GOAL #5   Title  Pt will demo at least 90* R shoulder flex for functional reach with min compensation.    Period  Weeks    Status  New      OT LONG TERM GOAL #6   Title  Pt will be able to carry light bag of groceries with RUE.    Time  12    Period  Weeks    Status  New      OT LONG TERM GOAL #7   Title  Pt will demo improved coordination/RUE functional reach as shown by scoring at least 30 on box and blocks test.    Time  12    Period  Weeks    Status  New      OT LONG TERM GOAL #8   Title  Pt will demo at least 35lbs R grip strength for opening containers/carrying objects.    Baseline  22lbs    Time  12    Period  Weeks    Status  New            Plan - 09/21/18 0900    Clinical Impression Statement  Pt is progressing towards goals. He reports the foam grips are working well at home for eating. Pt demonstrates improving RUE fine motor coordination and functional use.    Occupational Profile and client history currently impacting functional performance  Pt was very independent and active prior to surgery.  Pt drove, performed yardwork,  most cooking/home maintenance tasks, and enjoyed outdoor leisure activities.  Pt is currently not able to drive, perform yardwork, perform previous cooking/home maintenance tasks or leisure tasks.  Pt's goal is to return to 90-100% of "normal"    Occupational performance deficits (Please refer to evaluation for details):  ADL's;IADL's;Social Participation;Leisure    Rehab Potential  Good    OT Frequency  2x / week    OT Duration  12 weeks    OT Treatment/Interventions  Self-care/ADL training;Cryotherapy;Paraffin;Therapeutic exercise;DME and/or AE instruction;Functional Mobility Training;Cognitive  remediation/compensation;Balance training;Splinting;Manual Therapy;Neuromuscular education;Fluidtherapy;Ultrasound;Electrical Stimulation;Aquatic Therapy;Moist Heat;Energy conservation;Passive range of motion;Therapeutic activities;Patient/family education    Plan  neuro re-ed RUE, RUE functional use, wt. bearing    Consulted and Agree with Plan of Care  Patient       Patient will benefit from skilled therapeutic intervention in order to improve the following deficits and impairments:  Abnormal gait, Decreased balance, Decreased endurance, Decreased mobility, Decreased range of motion, Decreased cognition, Decreased activity tolerance, Decreased coordination, Decreased knowledge of use of DME, Decreased strength, Impaired flexibility, Impaired UE functional use  Visit Diagnosis: Muscle weakness (generalized)  Hemiplegia and hemiparesis following cerebral infarction affecting right non-dominant side (HCC)  Other lack of coordination    Problem List Patient Active Problem List   Diagnosis Date Noted  . Other abnormalities of gait and mobility 09/17/2018  . Acute cerebral infarction ([facility])   . Thrombocytopenia ([facility])   . 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 Acute blood loss anemia   . Generalized OA   . Gastroesophageal reflux disease   . Seizure prophylaxis   . Leukocytosis   . Tumor 08/18/2018  . 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 MACULAR DEGENERATION 02/29/2008  . ALLERGY 02/29/2008  . Nonspecific (abnormal) findings on radiological and other examination of body structure 12/26/2007  . NONSPECIFIC ABNORM FIND RAD&OTH EXAM LUNG FIELD 12/26/2007  . GALLSTONES 10/01/2007  . ELEVATION, TRANSAMINASE/LDH LEVELS 09/06/2007  . HEPATITIS NOS 09/01/2007  . ABDOMINAL PAIN, EPIGASTRIC 09/01/2007    Shaletta Hinostroza 09/21/2018, 9:29 AM  [facility] [address]  [place], [place], [address] Phone: [phone]   Fax:  [phone]  Name: James Olsen MRN: [id number] Date of Birth: Nov 12, 1946

## 2018-09-21 NOTE — Therapy (Signed)
[facility] [address] [place], [place], [address] Phone: [phone]   Fax:  [phone]  Speech Language Pathology Treatment  Patient Details  Name: James Olsen MRN: [id number] Date of Birth: 1946-11-10 Referring Provider (SLP): Alger Simons MD   Encounter Date: 09/21/2018  End of Session - 09/21/18 1347    Visit Number  5    Number of Visits  17    Date for SLP Re-Evaluation  12/03/18    SLP Start Time  0935    SLP Stop Time   0630    SLP Time Calculation (min)  40 min    Activity Tolerance  Patient tolerated treatment well       Past Medical History:  Diagnosis Date  . Arthritis   . GERD (gastroesophageal reflux disease)   . Glaucoma   . Macular degeneration   . Wears glasses     Past Surgical History:  Procedure Laterality Date  . CHOLECYSTECTOMY  2008   lap choli  . COLONOSCOPY    . CRANIOTOMY N/A 08/18/2018   Procedure: CRANIOTOMY TUMOR EXCISION;  Surgeon: [name], MD;  Location: [facility];  Service: Neurosurgery;  Laterality: N/A;  CRANIOTOMY TUMOR EXCISION  . NAILBED REPAIR Left 05/04/2014   Procedure: LEFT INDEX NAIL ABLATION V-Y FLAP COVERAGE;  Surgeon: [name]., MD;  Location: [facility];  Service: Orthopedics;  Laterality: Left;  index  . PILONIDAL CYST EXCISION     x2  . TONSILLECTOMY    . VASECTOMY      There were no vitals filed for this visit.  Subjective Assessment - 09/21/18 0935    Subjective  "I'm never sure (if things are right now), but I did double check (the homework)."    Currently in Pain?  No/denies            ADULT SLP TREATMENT - 09/21/18 0938      General Information   Behavior/Cognition  Alert;Cooperative;Pleasant mood      Treatment Provided   Treatment provided  Cognitive-Linquistic      Cognitive-Linquistic Treatment   Treatment focused on  Cognition    Skilled Treatment  Pt states he was more careful as he worked  through his detailed homework, adn he double checked his work - 100% success on this. On his word relationship homework pt left some blanks - SLP questioned why and pt exhibited anticipatory awareness in that he left blanks in order to convey spearation of meanings of words. In detailed written directions, pt followed directions 88% of the time and double checked his work spontaneously.       Assessment / Recommendations / Plan   Plan  Continue with current plan of care      Progression Toward Goals   Progression toward goals  Progressing toward goals         SLP Short Term Goals - 09/21/18 1348      SLP SHORT TERM GOAL #1   Title  pt will provide sentence responses 18/20 with WFL speech fluidity over three sessions    Time  2    Period  Weeks    Status  On-going      SLP SHORT TERM GOAL #2   Title  pt will undergo formal cognitive linguistic assessment by visit number 3    Status  Achieved      SLP SHORT TERM GOAL #3   Title  using compensatory strategies pt will functionally participate in  10 minutes simple conversation over 3 sessions    Baseline  09-15-18    Time  2    Status  On-going       SLP Long Term Goals - 09/21/18 1349      SLP LONG TERM GOAL #1   Title  using compensatory strategies pt will functionally participate in 10 minutes mod complex conversation over 3 sessions    Time  6    Period  Weeks   or 17 sessions   Status  On-going      SLP LONG TERM GOAL #2   Title  pt will functionally perform complex naming tasks with use of compensations over 2 sessions    Baseline  09/09/18    Time  6    Period  Weeks    Status  On-going      SLP LONG TERM GOAL #3   Title  pt will demo WNL emergent awareness with mod complex tasks in order to acheive 100% success over three sessions    Time  6    Period  Weeks    Status  New       Plan - 09/21/18 1347    Clinical Impression Statement  Pt's langauge remains mildly impaired. Pt cont'd to demo high level attention  and awareness impairments today simple but detailed written tasks Pt states his language seems to have improved over the weekend. Continue skilled ST to maximize communication and higher level cognition for independence and QOL.     Speech Therapy Frequency  2x / week    Duration  --   8 weeks/17 visits   Treatment/Interventions  SLP instruction and feedback;Compensatory strategies;Functional tasks;Cognitive reorganization;Internal/external aids;Patient/family education;Multimodal communcation approach;Environmental controls;Language facilitation;Cueing hierarchy    Potential to Achieve Goals  Good    Consulted and Agree with Plan of Care  Patient       Patient will benefit from skilled therapeutic intervention in order to improve the following deficits and impairments:   Cognitive communication deficit  Aphasia    Problem List Patient Active Problem List   Diagnosis Date Noted  . Other abnormalities of gait and mobility 09/17/2018  . Acute cerebral infarction ([facility])   . Thrombocytopenia ([facility])   . 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 Acute blood loss anemia   . Generalized OA   . Gastroesophageal reflux disease   . Seizure prophylaxis   . Leukocytosis   . Tumor 08/18/2018  . Meningioma determined by biopsy of brain ([facility]) 08/18/2018  . MACULAR DEGENERATION 02/29/2008  . ALLERGY 02/29/2008  . Nonspecific (abnormal) findings on radiological and other examination of body structure 12/26/2007  . NONSPECIFIC ABNORM FIND RAD&OTH EXAM LUNG FIELD 12/26/2007  . GALLSTONES 10/01/2007  . ELEVATION, TRANSAMINASE/LDH LEVELS 09/06/2007  . HEPATITIS NOS 09/01/2007  . ABDOMINAL PAIN, EPIGASTRIC 09/01/2007    Rasul Decola ,MS, CCC-SLP  09/21/2018, 1:51 PM  [facility] [address] [place], [place], [address] Phone: [phone]   Fax:  [phone]   Name: James Olsen MRN:  [id number] Date of Birth: 1946-12-17

## 2018-09-21 NOTE — Patient Instructions (Signed)
  Please complete the assigned speech therapy homework prior to your next session and return it to the speech therapist at your next visit.  

## 2018-09-23 ENCOUNTER — Ambulatory Visit: Payer: Medicare Other | Admitting: Occupational Therapy

## 2018-09-23 DIAGNOSIS — M6281 Muscle weakness (generalized): Secondary | ICD-10-CM

## 2018-09-23 DIAGNOSIS — R278 Other lack of coordination: Secondary | ICD-10-CM

## 2018-09-23 DIAGNOSIS — I69353 Hemiplegia and hemiparesis following cerebral infarction affecting right non-dominant side: Secondary | ICD-10-CM

## 2018-09-23 NOTE — Therapy (Signed)
[facility] [address] [place], [place], [address] Phone: [phone]   Fax:  [phone]  Occupational Therapy Treatment  Patient Details  Name: James Olsen MRN: [id number] Date of Birth: [birth year] No data recorded  Encounter Date: 09/23/2018  OT End of Session - 09/23/18 1146    Visit Number  6    Number of Visits  25    Date for OT Re-Evaluation  12/06/18    Authorization Type  cert. 09/07/18-12/06/18    Authorization Time Period  Medicare & [employer], covered 100%    Authorization - Visit Number  6    Authorization - Number of Visits  10    OT Start Time  [phone]    OT Stop Time  0930    OT Time Calculation (min)  40 min    Activity Tolerance  Patient tolerated treatment well    Behavior During Therapy  WFL for tasks assessed/performed       Past Medical History:  Diagnosis Date  . Arthritis   . GERD (gastroesophageal reflux disease)   . Glaucoma   . Macular degeneration   . Wears glasses     Past Surgical History:  Procedure Laterality Date  . CHOLECYSTECTOMY  2008   lap choli  . COLONOSCOPY    . CRANIOTOMY N/A 08/18/2018   Procedure: CRANIOTOMY TUMOR EXCISION;  Surgeon: [name], MD;  Location: [facility];  Service: Neurosurgery;  Laterality: N/A;  CRANIOTOMY TUMOR EXCISION  . NAILBED REPAIR Left 05/04/2014   Procedure: LEFT INDEX NAIL ABLATION V-Y FLAP COVERAGE;  Surgeon: [name]., MD;  Location: [facility];  Service: Orthopedics;  Laterality: Left;  index  . PILONIDAL CYST EXCISION     x2  . TONSILLECTOMY    . VASECTOMY      There were no vitals filed for this visit.  Subjective Assessment - 09/23/18 1145    Pertinent History  Meningioma s/p craniotomy for tumor resection 08/18/18, Acute cerebral infarction; arthritis, macular degeneration, hepatitis, GERD    Patient Stated Goals  be back to normal or 95%    Currently in Pain?  No/denies          Treatment:  Quadraped cat/cow positions, rocking forwards and backward, then over ball lifting alternate UE min facilitation/ v.c- all for core stability and UE strength. Arm bike x 5 mins level 3 for reciprocal movement. Standing for midrange functional reaching to place large pegs into vertical pegboard with RUE min difficulty/ v.c                   OT Short Term Goals - 09/08/18 2037      OT SHORT TERM GOAL #1   Title  Pt will be independent with initial HEP.--check STG 10/22/18    Time  6    Period  Weeks    Status  New      OT SHORT TERM GOAL #2   Title  Pt will be able to bathe mod I.    Time  6    Period  Weeks    Status  New      OT SHORT TERM GOAL #3   Title  Pt will be able to complete clothing fasteners mod I.    Time  6    Period  Weeks    Status  New      OT SHORT TERM GOAL #4   Title  Pt will be able to cut  food mod I.    Time  6    Period  Weeks    Status  New      OT SHORT TERM GOAL #5   Title  Pt will demo at least 60* R shoulder flex with only min compensation for improved functional reach.    Baseline  45* with mod compensation    Period  Weeks    Status  New      OT SHORT TERM GOAL #6   Title  Pt will be able to use RUE as stabilizer consistently for ADLs.    Time  6    Period  Weeks    Status  New        OT Long Term Goals - 09/08/18 2040      OT LONG TERM GOAL #1   Title  Pt will be independent with updated HEP.--check LTGs 12/06/18    Time  12    Period  Weeks    Status  New      OT LONG TERM GOAL #2   Title  Pt will perform simple cooking tasks mod I.    Time  12    Period  Weeks    Status  New      OT LONG TERM GOAL #3   Title  Pt will perform mod complex home maintenance tasks mod I.    Time  12    Period  Weeks    Status  New      OT LONG TERM GOAL #4   Title  Pt will use RUE as nondominant assist at least 75% of the time for ADLs.    Time  8    Period  Weeks    Status  New      OT LONG TERM GOAL #5   Title  Pt  will demo at least 90* R shoulder flex for functional reach with min compensation.    Period  Weeks    Status  New      OT LONG TERM GOAL #6   Title  Pt will be able to carry light bag of groceries with RUE.    Time  12    Period  Weeks    Status  New      OT LONG TERM GOAL #7   Title  Pt will demo improved coordination/RUE functional reach as shown by scoring at least 30 on box and blocks test.    Time  12    Period  Weeks    Status  New      OT LONG TERM GOAL #8   Title  Pt will demo at least 35lbs R grip strength for opening containers/carrying objects.    Baseline  22lbs    Time  12    Period  Weeks    Status  New            Plan - 09/23/18 1147    Clinical Impression Statement  Pt is progressing towards goals He demonstrates improving LUE functional use and coordination.    Occupational performance deficits (Please refer to evaluation for details):  ADL's;IADL's;Social Participation;Leisure    Rehab Potential  Good    OT Frequency  2x / week    OT Duration  12 weeks    OT Treatment/Interventions  Self-care/ADL training;Cryotherapy;Paraffin;Therapeutic exercise;DME and/or AE instruction;Functional Mobility Training;Cognitive remediation/compensation;Balance training;Splinting;Manual Therapy;Neuromuscular education;Fluidtherapy;Ultrasound;Electrical Stimulation;Aquatic Therapy;Moist Heat;Energy conservation;Passive range of motion;Therapeutic activities;Patient/family education    Plan  neuro re-ed RUE, RUE functional  use, wt. bearing    OT Home Exercise Plan  Education provided:  edema management, proper positioning of RUE, initial HEP    Consulted and Agree with Plan of Care  Patient       Patient will benefit from skilled therapeutic intervention in order to improve the following deficits and impairments:  Abnormal gait, Decreased balance, Decreased endurance, Decreased mobility, Decreased range of motion, Decreased cognition, Decreased activity tolerance, Decreased  coordination, Decreased knowledge of use of DME, Decreased strength, Impaired flexibility, Impaired UE functional use  Visit Diagnosis: Muscle weakness (generalized)  Hemiplegia and hemiparesis following cerebral infarction affecting right non-dominant side (HCC)  Other lack of coordination    Problem List Patient Active Problem List   Diagnosis Date Noted  . Other abnormalities of gait and mobility 09/17/2018  . Acute cerebral infarction ([facility])   . Thrombocytopenia ([facility])   . 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 Acute blood loss anemia   . Generalized OA   . Gastroesophageal reflux disease   . Seizure prophylaxis   . Leukocytosis   . Tumor 08/18/2018  . Meningioma determined by biopsy of brain ([facility]) 08/18/2018  . MACULAR DEGENERATION 02/29/2008  . ALLERGY 02/29/2008  . Nonspecific (abnormal) findings on radiological and other examination of body structure 12/26/2007  . NONSPECIFIC ABNORM FIND RAD&OTH EXAM LUNG FIELD 12/26/2007  . GALLSTONES 10/01/2007  . ELEVATION, TRANSAMINASE/LDH LEVELS 09/06/2007  . HEPATITIS NOS 09/01/2007  . ABDOMINAL PAIN, EPIGASTRIC 09/01/2007    Jarelis Ehlert 09/23/2018, 11:49 AM  [facility] [address] [place], [place], [address] Phone: [phone]   Fax:  [phone]  Name: James Olsen MRN: [id number] Date of Birth: 02/22/1946

## 2018-09-24 ENCOUNTER — Encounter: Payer: Self-pay | Admitting: Physical Therapy

## 2018-09-24 ENCOUNTER — Ambulatory Visit: Payer: Medicare Other | Admitting: Speech Pathology

## 2018-09-24 ENCOUNTER — Ambulatory Visit: Payer: Medicare Other | Admitting: Physical Therapy

## 2018-09-24 DIAGNOSIS — R41841 Cognitive communication deficit: Secondary | ICD-10-CM

## 2018-09-24 DIAGNOSIS — R4701 Aphasia: Secondary | ICD-10-CM

## 2018-09-24 DIAGNOSIS — M6281 Muscle weakness (generalized): Secondary | ICD-10-CM | POA: Diagnosis not present

## 2018-09-24 DIAGNOSIS — R278 Other lack of coordination: Secondary | ICD-10-CM

## 2018-09-24 DIAGNOSIS — R2689 Other abnormalities of gait and mobility: Secondary | ICD-10-CM

## 2018-09-24 DIAGNOSIS — R293 Abnormal posture: Secondary | ICD-10-CM

## 2018-09-24 DIAGNOSIS — R2681 Unsteadiness on feet: Secondary | ICD-10-CM

## 2018-09-24 NOTE — Patient Instructions (Signed)
Access Code: [email or web address]  URL: [email or web address]  Date: 09/24/2018  Prepared by: Floreen Comber   Exercises  Side Stepping with Resistance at Thighs - 5 reps - 3 sets - 1x daily - 7x weekly  Supine Bridge with Resistance Band - 10 reps - 3 sets - 1x daily - 5x weekly  Sit to Stand without Arm Support - 10 reps - 3 sets - 1x daily - 5x weekly  Wall Squat - 10 reps - 2 sets - 3-5 hold - 1x daily - 5x weekly  Standing Gastroc Stretch at Counter - 3 reps - 1 sets - 30 hold - 1x daily - 5x weekly

## 2018-09-24 NOTE — Therapy (Signed)
[facility] [address] [place], [place], [address] Phone: [phone]   Fax:  [phone]  Physical Therapy Treatment  Patient Details  Name: James Olsen MRN: [id number] Date of Birth: 12-17-1946 Referring Provider (PT): Dr. Alger Simons   Encounter Date: 09/24/2018  PT End of Session - 09/24/18 1323    Visit Number  4    Number of Visits  16    Date for PT Re-Evaluation  11/06/18    Authorization Type  Medicare Part A&B- Medicare progress note every 10th visit    Authorization Time Period  Follow medicare guidelines     PT Start Time  1015    PT Stop Time  1100    PT Time Calculation (min)  45 min    Equipment Utilized During Treatment  Gait belt    Activity Tolerance  Patient tolerated treatment well    Behavior During Therapy  [facility] for tasks assessed/performed       Past Medical History:  Diagnosis Date  . Arthritis   . GERD (gastroesophageal reflux disease)   . Glaucoma   . Macular degeneration   . Wears glasses     Past Surgical History:  Procedure Laterality Date  . CHOLECYSTECTOMY  2008   lap choli  . COLONOSCOPY    . CRANIOTOMY N/A 08/18/2018   Procedure: CRANIOTOMY TUMOR EXCISION;  Surgeon: [name], MD;  Location: [facility];  Service: Neurosurgery;  Laterality: N/A;  CRANIOTOMY TUMOR EXCISION  . NAILBED REPAIR Left 05/04/2014   Procedure: LEFT INDEX NAIL ABLATION V-Y FLAP COVERAGE;  Surgeon: [name]., MD;  Location: [facility];  Service: Orthopedics;  Laterality: Left;  index  . PILONIDAL CYST EXCISION     x2  . TONSILLECTOMY    . VASECTOMY      There were no vitals filed for this visit.  Subjective Assessment - 09/24/18 1021    Subjective  Pt reports his HEP exercises are going well and has no new complaints. Reports no falls.     Currently in Pain?  No/denies       Skilled Physical Therapy Intervention:  Pt verbalizes and demonstrates the below  updated HEP exercises for increasing LE strengthening and flexibility to improve functional mobility:     Exercises  Supine Bridge with Resistance Band - 10 reps - 3 sets - 1x daily - 5x weekly  Sit to Stand without Arm Support - 10 reps - 3 sets - 1x daily - 5x weekly  Wall Squat - 10 reps - 2 sets - 3-5 hold - 1x daily - 5x weekly  Standing Gastroc Stretch at Counter - 3 reps - 1 sets - 30 hold - 1x daily - 5x weekly      [facility] Adult PT Treatment/Exercise - 09/24/18 1321      Balance   Balance Assessed  Yes      Dynamic Standing Balance   Dynamic Standing - Comments  Pt performs dynamic standing balance incorporating forward and lateral stepping over hurdles of various heights and cone weaving with supervision to min guard assist 2/2 increase sway with balance challenge.       Exercises   Exercises  Knee/Hip      Knee/Hip Exercises: Standing   Hip Extension  Stengthening;Both;1 set   hooklying bridging with no resistance        PT Education - 09/24/18 1323    Education Details  Therapist provides education regarding updated HEP strengthening  and stretching exercises to improve functional mobility at home. Therapist also verbalized and demonstrated correct technique of lateral stepping HEP exercise to ensure patient was performing correctly at home.     Person(s) Educated  Patient    Methods  Explanation;Demonstration    Comprehension  Verbalized understanding;Returned demonstration       PT Short Term Goals - 09/07/18 1255      PT SHORT TERM GOAL #1   Title  Pt will demonstrate independence and compliancy with HEP to further progress towards improving strength and functional mobility     Baseline  Dependent     Time  4    Period  Weeks    Status  New    Target Date  10/07/18      PT SHORT TERM GOAL #2   Title  Pt will improve Berg Balance Assessment by 3 points indicating minimal detectable change and decreased fall risk    Baseline  46/51 on 9/17    Time  4     Period  Weeks    Status  New    Target Date  10/07/18      PT SHORT TERM GOAL #3   Title  Pt will improve gait speed to 4.08 ft/sec using LRAD and supervision to improve functional mobility     Baseline  3.26 ft/sec on 9/17    Time  4    Period  Weeks    Status  New    Target Date  10/07/18      PT SHORT TERM GOAL #4   Title  Pt will improve FGA score by 4 points to increase dynamic standing during functional gait using no AD    Baseline  12/30 on 9/17    Time  4    Period  Weeks    Status  New    Target Date  10/07/18      PT SHORT TERM GOAL #5   Title  Pt will ambulate 300 feet with LRAD ambulating over uneven surfaces, curbs, ramps, and demonstrate ability to vary gait speed to improve community accessibility safely     Time  4    Period  Weeks    Status  New    Target Date  10/07/18        PT Long Term Goals - 09/07/18 1301      PT LONG TERM GOAL #1   Title  Pt will demonstrate independence with HEP to further progress towards increasing strength and functional mobility     Time  8    Period  Weeks    Status  New    Target Date  11/06/18      PT LONG TERM GOAL #2   Title  Pt will improve Berg Balance Score to >50/56 to decrease fall risk potential     Time  8    Period  Weeks    Status  New    Target Date  10/07/18      PT LONG TERM GOAL #3   Title  Pt will improve FGA score to >17/30 to improve safety and balance during functional mobility with no AD and supervision     Time  8    Period  Weeks    Status  New    Target Date  11/06/18      PT LONG TERM GOAL #4   Title  Pt will ambulate outdoors over uneven surfaces, curbs, ramps, inclines, and demonstrate ability to vary gait  speed with LRAD and Mod I to improve safety with community accessibility     Time  8    Period  Weeks    Status  New    Target Date  11/06/18      PT LONG TERM GOAL #5   Title  Pt will negotiate 12 steps using single HR and recirprocal stepping technique, mod I to increase safety  and independence with community accessibility indicating improvement in LE strength    Time  8    Period  Weeks    Status  New    Target Date  11/06/18      Additional Long Term Goals   Additional Long Term Goals  Yes      PT LONG TERM GOAL #6   Title  Pt will improve gait speed to >/= to 4.37 ft/sec indicating normal walking speed with Mod I and LRAD to increase independence with functional mobility     Time  8    Period  Weeks    Status  New    Target Date  11/06/18            Plan - 09/24/18 1324    Clinical Impression Statement  Skilled session focused on updating patient's HEP to incorporate lower extremity strengthening and stretching exercises to improve safety with functional mobility. Pt verbalizes and demonstrates understanding of exercises with proper technique. Pt tolerates dynamic balance activity incorporating stepping over hurdles and weaving through cones well with increase in A/P sway requiring therapist to provide supervision to min guard assist for safety. Pt will continue to benefit from skilled physical therapy to address strength, balance and functional mobility deficits.     Rehab Potential  Good    Clinical Impairments Affecting Rehab Potential  R sided weakness with difficulty negotiating obstacles and performing high level balance    PT Frequency  2x / week    PT Duration  8 weeks    PT Treatment/Interventions  ADLs/Self Care Home Management;Gait training;Neuromuscular re-education;Functional mobility training;Stair training;Cognitive remediation;Energy conservation;Patient/family education;Therapeutic activities;Therapeutic exercise;Orthotic Fit/Training;Balance training;DME Instruction    PT Next Visit Plan  floor recovery with quadriped and tall kneeling exercises on floor prior to recovery, high level balance, rocker board with cone tapping, foam with pertubations, bosu ball for hip strategy, ramp with compliant surface, step ups, eccentric quads, glut med  strengthening, STS with L LE staggered, bridging, obstacle course/ compliant surface with items under, community ambulation, hurdles, SLS with cone tapping all around,     PT Home Exercise Plan  [id number] & [id number]    Consulted and Agree with Plan of Care  Patient       Patient will benefit from skilled therapeutic intervention in order to improve the following deficits and impairments:  Abnormal gait, Decreased cognition, Decreased knowledge of use of DME, Improper body mechanics, Decreased mobility, Postural dysfunction, Decreased strength, Impaired perceived functional ability, Impaired UE functional use, Decreased balance, Decreased safety awareness, Difficulty walking  Visit Diagnosis: Muscle weakness (generalized)  Other lack of coordination  Abnormal posture  Other abnormalities of gait and mobility  Unsteadiness on feet     Problem List Patient Active Problem List   Diagnosis Date Noted  . Other abnormalities of gait and mobility 09/17/2018  . Acute cerebral infarction ([place])   . Thrombocytopenia ([facility])   . 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 Acute blood loss anemia   . Generalized OA   . Gastroesophageal reflux  disease   . Seizure prophylaxis   . Leukocytosis   . Tumor 08/18/2018  . 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 MACULAR DEGENERATION 02/29/2008  . ALLERGY 02/29/2008  . Nonspecific (abnormal) findings on radiological and other examination of body structure 12/26/2007  . NONSPECIFIC ABNORM FIND RAD&OTH EXAM LUNG FIELD 12/26/2007  . GALLSTONES 10/01/2007  . ELEVATION, TRANSAMINASE/LDH LEVELS 09/06/2007  . HEPATITIS NOS 09/01/2007  . ABDOMINAL PAIN, EPIGASTRIC 09/01/2007    Floreen Comber, SPT 09/24/2018, 1:30 PM  [facility] [address] [place], [place], [address] Phone: [phone]   Fax:  [phone]  Name: VASILY FEDEWA MRN: [id number] Date of Birth: [birth year]

## 2018-09-24 NOTE — Therapy (Signed)
[facility] [address] [place], [place], [address] Phone: [phone]   Fax:  [phone]  Speech Language Pathology Treatment  Patient Details  Name: James Olsen MRN: [id number] Date of Birth: [birth year] Referring Provider (SLP): Alger Simons MD   Encounter Date: 09/24/2018  End of Session - 09/24/18 1024    Visit Number  6    Number of Visits  17    Date for SLP Re-Evaluation  12/03/18    SLP Start Time  0933    SLP Stop Time   1016    SLP Time Calculation (min)  43 min    Activity Tolerance  Patient tolerated treatment well       Past Medical History:  Diagnosis Date  . Arthritis   . GERD (gastroesophageal reflux disease)   . Glaucoma   . Macular degeneration   . Wears glasses     Past Surgical History:  Procedure Laterality Date  . CHOLECYSTECTOMY  2008   lap choli  . COLONOSCOPY    . CRANIOTOMY N/A 08/18/2018   Procedure: CRANIOTOMY TUMOR EXCISION;  Surgeon: [name], MD;  Location: [facility];  Service: Neurosurgery;  Laterality: N/A;  CRANIOTOMY TUMOR EXCISION  . NAILBED REPAIR Left 05/04/2014   Procedure: LEFT INDEX NAIL ABLATION V-Y FLAP COVERAGE;  Surgeon: [name]., MD;  Location: [facility];  Service: Orthopedics;  Laterality: Left;  index  . PILONIDAL CYST EXCISION     x2  . TONSILLECTOMY    . VASECTOMY      There were no vitals filed for this visit.  Subjective Assessment - 09/24/18 0934    Subjective  "I was forewarned" re: schedule change    Currently in Pain?  No/denies            ADULT SLP TREATMENT - 09/24/18 0933      General Information   Behavior/Cognition  Alert;Cooperative;Pleasant mood      Treatment Provided   Treatment provided  Cognitive-Linquistic      Pain Assessment   Pain Assessment  No/denies pain      Cognitive-Linquistic Treatment   Treatment focused on  Cognition    Skilled Treatment  Pt told SLP he double  checked his homework. Money word problems were 100% accurate. He told SLP he has been working on "attention to details." SLP targeted attention to detail with functional online bill-paying task. Pt noted to spontaneously double check his work; mildly slow processing. He demo'd anticipatory awareness as he progressed through the task, stating, "there's got to be a catch in here somewhere," and recognizing the need to read all information on the page before proceeding with task. 100% accuracy with extended time. Continued targeting attention with letter transcription/decoding task. Pt self-corrected 2/4 errors, occasional min A for error awareness/correction.      Assessment / Recommendations / Plan   Plan  Continue with current plan of care      Progression Toward Goals   Progression toward goals  Progressing toward goals         SLP Short Term Goals - 09/24/18 1024      SLP SHORT TERM GOAL #1   Time  2    Period  Weeks    Status  On-going      SLP SHORT TERM GOAL #2   Title  pt will undergo formal cognitive linguistic assessment by visit number 3    Status  Achieved      SLP  SHORT TERM GOAL #3   Title  using compensatory strategies pt will functionally participate in 10 minutes simple conversation over 3 sessions    Baseline  09-15-18    Time  2    Period  Weeks    Status  On-going       SLP Long Term Goals - 09/24/18 1024      SLP LONG TERM GOAL #1   Title  using compensatory strategies pt will functionally participate in 10 minutes mod complex conversation over 3 sessions    Time  6    Period  Weeks   or 17 sessions   Status  On-going      SLP LONG TERM GOAL #2   Title  pt will functionally perform complex naming tasks with use of compensations over 2 sessions    Baseline  09/09/18    Time  6    Status  On-going      SLP LONG TERM GOAL #3   Title  pt will demo WNL emergent awareness with mod complex tasks in order to acheive 100% success over three sessions    Time  6     Period  Weeks    Status  On-going       Plan - 09/24/18 1024    Clinical Impression Statement  Pt's langauge remains mildly impaired. Pt cont'd to demo high level attention and awareness impairments today simple but detailed written tasks Pt states his language seems to have improved over the weekend. Continue skilled ST to maximize communication and higher level cognition for independence and QOL.     Speech Therapy Frequency  2x / week    Treatment/Interventions  SLP instruction and feedback;Compensatory strategies;Functional tasks;Cognitive reorganization;Internal/external aids;Patient/family education;Multimodal communcation approach;Environmental controls;Language facilitation;Cueing hierarchy    Potential to Achieve Goals  Good       Patient will benefit from skilled therapeutic intervention in order to improve the following deficits and impairments:   Cognitive communication deficit  Aphasia    Problem List Patient Active Problem List   Diagnosis Date Noted  . Other abnormalities of gait and mobility 09/17/2018  . Acute cerebral infarction ([facility])   . Thrombocytopenia ([place])   . 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 Acute blood loss anemia   . Generalized OA   . Gastroesophageal reflux disease   . Seizure prophylaxis   . Leukocytosis   . Tumor 08/18/2018  . 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 MACULAR DEGENERATION 02/29/2008  . ALLERGY 02/29/2008  . Nonspecific (abnormal) findings on radiological and other examination of body structure 12/26/2007  . NONSPECIFIC ABNORM FIND RAD&OTH EXAM LUNG FIELD 12/26/2007  . GALLSTONES 10/01/2007  . ELEVATION, TRANSAMINASE/LDH LEVELS 09/06/2007  . HEPATITIS NOS 09/01/2007  . ABDOMINAL PAIN, EPIGASTRIC 09/01/2007   Deneise Lever, [place], [place] 09/24/2018, 10:25 AM  [facility] [address] [place], [place], [address] Phone: [phone]   Fax:  [phone]   Name: SIDHANT HELDERMAN MRN: [id number] Date of Birth: [birth year]

## 2018-09-27 ENCOUNTER — Ambulatory Visit: Payer: Medicare Other | Admitting: Speech Pathology

## 2018-09-27 DIAGNOSIS — R41841 Cognitive communication deficit: Secondary | ICD-10-CM

## 2018-09-27 DIAGNOSIS — M6281 Muscle weakness (generalized): Secondary | ICD-10-CM | POA: Diagnosis not present

## 2018-09-27 DIAGNOSIS — R4701 Aphasia: Secondary | ICD-10-CM

## 2018-09-27 NOTE — Therapy (Signed)
[facility] [address] [place], [place], [address] Phone: [phone]   Fax:  [phone]  Speech Language Pathology Treatment  Patient Details  Name: James Olsen MRN: [id number] Date of Birth: October 29, 1946 Referring Provider (SLP): Alger Simons MD   Encounter Date: 09/27/2018  End of Session - 09/27/18 1228    Visit Number  7    Number of Visits  17    Date for SLP Re-Evaluation  12/03/18    SLP Start Time  1018    SLP Stop Time   1100    SLP Time Calculation (min)  42 min    Activity Tolerance  Patient tolerated treatment well       Past Medical History:  Diagnosis Date  . Arthritis   . GERD (gastroesophageal reflux disease)   . Glaucoma   . Macular degeneration   . Wears glasses     Past Surgical History:  Procedure Laterality Date  . CHOLECYSTECTOMY  2008   lap choli  . COLONOSCOPY    . CRANIOTOMY N/A 08/18/2018   Procedure: CRANIOTOMY TUMOR EXCISION;  Surgeon: [name], MD;  Location: [facility];  Service: Neurosurgery;  Laterality: N/A;  CRANIOTOMY TUMOR EXCISION  . NAILBED REPAIR Left 05/04/2014   Procedure: LEFT INDEX NAIL ABLATION V-Y FLAP COVERAGE;  Surgeon: [name]., MD;  Location: [facility];  Service: Orthopedics;  Laterality: Left;  index  . PILONIDAL CYST EXCISION     x2  . TONSILLECTOMY    . VASECTOMY      There were no vitals filed for this visit.  Subjective Assessment - 09/27/18 1023    Subjective  Pt arrives with homework completed    Currently in Pain?  No/denies            ADULT SLP TREATMENT - 09/27/18 1018      General Information   Behavior/Cognition  Alert;Cooperative;Pleasant mood      Treatment Provided   Treatment provided  Cognitive-Linquistic      Cognitive-Linquistic Treatment   Treatment focused on  Cognition;Aphasia    Skilled Treatment  SLP facilitated simple conversation re: pt's and his wife's hobbies; pt  successfully used compensations and fluidity was [facility] over 12 minutes conversation. Sentence level responses with fluidity >90% of the time. Worked with pt on attention to detail and awareness in mod complex mathematical task (calculating measurements). Moderately extended time required. Pt accuracy 88%; he did review for errors however required min-mod A for awareness of errors.       Assessment / Recommendations / Plan   Plan  Continue with current plan of care      Progression Toward Goals   Progression toward goals  Progressing toward goals         SLP Short Term Goals - 09/27/18 1024      SLP SHORT TERM GOAL #1   Title  pt will provide sentence responses 18/20 with WFL speech fluidity over three sessions    Time  1    Period  Weeks    Status  On-going      SLP SHORT TERM GOAL #2   Title  pt will undergo formal cognitive linguistic assessment by visit number 3    Status  Achieved      SLP SHORT TERM GOAL #3   Title  using compensatory strategies pt will functionally participate in 10 minutes simple conversation over 3 sessions    Baseline  09-15-18, 09-27-18  Time  1    Period  Weeks    Status  On-going       SLP Long Term Goals - 09/27/18 1041      SLP LONG TERM GOAL #1   Title  using compensatory strategies pt will functionally participate in 10 minutes mod complex conversation over 3 sessions    Time  5    Period  Weeks   8 weeks or 17 visits for all LTGs   Status  On-going      SLP LONG TERM GOAL #2   Title  pt will functionally perform complex naming tasks with use of compensations over 2 sessions    Baseline  09/09/18    Time  5    Period  Weeks    Status  On-going      SLP LONG TERM GOAL #3   Title  pt will demo WNL emergent awareness with mod complex tasks in order to acheive 100% success over three sessions    Time  5    Period  Weeks    Status  On-going       Plan - 09/27/18 1228    Clinical Impression Statement  Pt's language remains mildly  impaired, though he used compensations in 12 minutes conversation today functionally. Pt cont'd to demo high level attention and awareness impairments today in simple but detailed written tasks. Continue skilled ST to maximize communication and higher level cognition for independence and QOL.     Speech Therapy Frequency  2x / week    Treatment/Interventions  SLP instruction and feedback;Compensatory strategies;Functional tasks;Cognitive reorganization;Internal/external aids;Patient/family education;Multimodal communcation approach;Environmental controls;Language facilitation;Cueing hierarchy    Potential to Achieve Goals  Good       Patient will benefit from skilled therapeutic intervention in order to improve the following deficits and impairments:   Aphasia  Cognitive communication deficit    Problem List Patient Active Problem List   Diagnosis Date Noted  . Other abnormalities of gait and mobility 09/17/2018  . Acute cerebral infarction ([facility])   . Thrombocytopenia ([facility])   . 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 Acute blood loss anemia   . Generalized OA   . Gastroesophageal reflux disease   . Seizure prophylaxis   . Leukocytosis   . Tumor 08/18/2018  . 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 MACULAR DEGENERATION 02/29/2008  . ALLERGY 02/29/2008  . Nonspecific (abnormal) findings on radiological and other examination of body structure 12/26/2007  . NONSPECIFIC ABNORM FIND RAD&OTH EXAM LUNG FIELD 12/26/2007  . GALLSTONES 10/01/2007  . ELEVATION, TRANSAMINASE/LDH LEVELS 09/06/2007  . HEPATITIS NOS 09/01/2007  . ABDOMINAL PAIN, EPIGASTRIC 09/01/2007   Deneise Lever, [place], [place] 09/27/2018, 12:30 PM  [facility] [address] [place], [place], [address] Phone: [phone]   Fax:  [phone]   Name:  LEEUM SANKEY MRN: [id number] Date of Birth: 01-26-1946

## 2018-09-28 ENCOUNTER — Ambulatory Visit: Payer: Medicare Other | Admitting: Occupational Therapy

## 2018-09-28 ENCOUNTER — Ambulatory Visit: Payer: Medicare Other | Admitting: Speech Pathology

## 2018-09-28 ENCOUNTER — Ambulatory Visit: Payer: Medicare Other

## 2018-09-28 DIAGNOSIS — R278 Other lack of coordination: Secondary | ICD-10-CM

## 2018-09-28 DIAGNOSIS — M6281 Muscle weakness (generalized): Secondary | ICD-10-CM

## 2018-09-28 DIAGNOSIS — R2689 Other abnormalities of gait and mobility: Secondary | ICD-10-CM

## 2018-09-28 DIAGNOSIS — R4701 Aphasia: Secondary | ICD-10-CM

## 2018-09-28 DIAGNOSIS — R293 Abnormal posture: Secondary | ICD-10-CM

## 2018-09-28 DIAGNOSIS — R2681 Unsteadiness on feet: Secondary | ICD-10-CM

## 2018-09-28 DIAGNOSIS — R41841 Cognitive communication deficit: Secondary | ICD-10-CM

## 2018-09-28 DIAGNOSIS — I69353 Hemiplegia and hemiparesis following cerebral infarction affecting right non-dominant side: Secondary | ICD-10-CM

## 2018-09-28 NOTE — Therapy (Signed)
[facility] [address] [place], [place], [address] Phone: [phone]   Fax:  [phone]  Speech Language Pathology Treatment  Patient Details  Name: James Olsen MRN: [id number] Date of Birth: 12-01-1946 Referring Provider (SLP): Alger Simons MD   Encounter Date: 09/28/2018  End of Session - 09/28/18 1249    Visit Number  8    Number of Visits  17    Date for SLP Re-Evaluation  12/03/18    SLP Start Time  0930    SLP Stop Time   6767    SLP Time Calculation (min)  45 min    Activity Tolerance  Patient tolerated treatment well       Past Medical History:  Diagnosis Date  . Arthritis   . GERD (gastroesophageal reflux disease)   . Glaucoma   . Macular degeneration   . Wears glasses     Past Surgical History:  Procedure Laterality Date  . CHOLECYSTECTOMY  2008   lap choli  . COLONOSCOPY    . CRANIOTOMY N/A 08/18/2018   Procedure: CRANIOTOMY TUMOR EXCISION;  Surgeon: [name], MD;  Location: [facility];  Service: Neurosurgery;  Laterality: N/A;  CRANIOTOMY TUMOR EXCISION  . NAILBED REPAIR Left 05/04/2014   Procedure: LEFT INDEX NAIL ABLATION V-Y FLAP COVERAGE;  Surgeon: [name]., MD;  Location: [facility];  Service: Orthopedics;  Laterality: Left;  index  . PILONIDAL CYST EXCISION     x2  . TONSILLECTOMY    . VASECTOMY      There were no vitals filed for this visit.  Subjective Assessment - 09/28/18 0932    Subjective  "Tricky, tricky" re: homework    Currently in Pain?  No/denies            ADULT SLP TREATMENT - 09/28/18 0934      General Information   Behavior/Cognition  Alert;Cooperative;Pleasant mood      Treatment Provided   Treatment provided  Cognitive-Linquistic      Pain Assessment   Pain Assessment  No/denies pain      Cognitive-Linquistic Treatment   Treatment focused on  Cognition;Aphasia    Skilled Treatment  Pt showed SLP his homework,  and SLP noted pt's interpretation of instructions was too literal. He reported he thought about different interpretations and completed it 2 different ways to ensure he was accurate; 100% of final answers were correct. SLP targeted attention and error awareness in written cognitive task; pt errors were due to decreased attention to detail and cognitive flexibility (overlooking dual/alternative meanings). In 14 min simple to mod complex conversation, pt used compensations for anomia functionally. Hesitations noted x2 however pt quickly used an appropriate alternative.      Assessment / Recommendations / Plan   Plan  Continue with current plan of care      Progression Toward Goals   Progression toward goals  Progressing toward goals         SLP Short Term Goals - 09/28/18 1250      SLP SHORT TERM GOAL #1   Title  pt will provide sentence responses 18/20 with WFL speech fluidity over three sessions    Time  1    Period  Weeks    Status  Achieved      SLP SHORT TERM GOAL #2   Title  pt will undergo formal cognitive linguistic assessment by visit number 3    Status  Achieved  SLP SHORT TERM GOAL #3   Title  using compensatory strategies pt will functionally participate in 10 minutes simple conversation over 3 sessions    Baseline  09-15-18, 09-27-18, 09-28-18    Time  1    Period  Weeks    Status  Achieved       SLP Long Term Goals - 09/28/18 1251      SLP LONG TERM GOAL #1   Title  using compensatory strategies pt will functionally participate in 10 minutes mod complex conversation over 3 sessions    Time  5    Period  Weeks    Status  On-going      SLP LONG TERM GOAL #2   Title  pt will functionally perform complex naming tasks with use of compensations over 2 sessions    Baseline  09/09/18    Time  5    Period  Weeks    Status  On-going      SLP LONG TERM GOAL #3   Title  pt will demo WNL emergent awareness with mod complex tasks in order to acheive 100% success over  three sessions    Time  5    Period  Weeks    Status  On-going       Plan - 09/28/18 1250    Clinical Impression Statement  Pt's language remains mildly impaired, though he used compensations in 12 minutes conversation today functionally. Pt cont'd to demo high level attention and awareness impairments today in simple but detailed written tasks. Continue skilled ST to maximize communication and higher level cognition for independence and QOL.     Speech Therapy Frequency  2x / week    Treatment/Interventions  SLP instruction and feedback;Compensatory strategies;Functional tasks;Cognitive reorganization;Internal/external aids;Patient/family education;Multimodal communcation approach;Environmental controls;Language facilitation;Cueing hierarchy       Patient will benefit from skilled therapeutic intervention in order to improve the following deficits and impairments:   Cognitive communication deficit  Aphasia    Problem List Patient Active Problem List   Diagnosis Date Noted  . Other abnormalities of gait and mobility 09/17/2018  . Acute cerebral infarction ([facility])   . Thrombocytopenia ([facility])   . 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 Acute blood loss anemia   . Generalized OA   . Gastroesophageal reflux disease   . Seizure prophylaxis   . Leukocytosis   . Tumor 08/18/2018  . 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 MACULAR DEGENERATION 02/29/2008  . ALLERGY 02/29/2008  . Nonspecific (abnormal) findings on radiological and other examination of body structure 12/26/2007  . NONSPECIFIC ABNORM FIND RAD&OTH EXAM LUNG FIELD 12/26/2007  . GALLSTONES 10/01/2007  . ELEVATION, TRANSAMINASE/LDH LEVELS 09/06/2007  . HEPATITIS NOS 09/01/2007  . ABDOMINAL PAIN, EPIGASTRIC 09/01/2007   Deneise Lever, [facility], [place] Speech-Language Pathologist  Aliene Altes 09/28/2018, 12:53 PM  [facility] [address] [place], [place], [address] Phone: [phone]   Fax:  [phone]   Name: James Olsen MRN: [id number] Date of Birth: Sep 08, 1946

## 2018-09-28 NOTE — Therapy (Signed)
[facility] [address] [place], [place], [address] Phone: [phone]   Fax:  [phone]  Occupational Therapy Treatment  Patient Details  Name: James Olsen MRN: [id number] Date of Birth: Jun 26, 1946 No data recorded  Encounter Date: 09/28/2018  OT End of Session - 09/28/18 0752    Visit Number  7    Number of Visits  25    Date for OT Re-Evaluation  12/06/18    Authorization Type  cert. 09/07/18-12/06/18    Authorization Time Period  Medicare & [employer], covered 100%    Authorization - Visit Number  7    Authorization - Number of Visits  10    OT Start Time  0750    OT Stop Time  0841    OT Time Calculation (min)  51 min    Activity Tolerance  Patient tolerated treatment well    Behavior During Therapy  WFL for tasks assessed/performed       Past Medical History:  Diagnosis Date  . Arthritis   . GERD (gastroesophageal reflux disease)   . Glaucoma   . Macular degeneration   . Wears glasses     Past Surgical History:  Procedure Laterality Date  . CHOLECYSTECTOMY  2008   lap choli  . COLONOSCOPY    . CRANIOTOMY N/A 08/18/2018   Procedure: CRANIOTOMY TUMOR EXCISION;  Surgeon: [name], MD;  Location: [facility];  Service: Neurosurgery;  Laterality: N/A;  CRANIOTOMY TUMOR EXCISION  . NAILBED REPAIR Left 05/04/2014   Procedure: LEFT INDEX NAIL ABLATION V-Y FLAP COVERAGE;  Surgeon: [name]., MD;  Location: [facility];  Service: Orthopedics;  Laterality: Left;  index  . PILONIDAL CYST EXCISION     x2  . TONSILLECTOMY    . VASECTOMY      There were no vitals filed for this visit.  Subjective Assessment - 09/28/18 0751    Subjective   numbness in feet and legs the last couple days--made surgical team aware    Pertinent History  Meningioma s/p craniotomy for tumor resection 08/18/18, Acute cerebral infarction; arthritis, macular degeneration, hepatitis, GERD    Patient Stated Goals   be back to normal or 95%    Currently in Pain?  No/denies        Checked STGs and LTGs and discussed progress and upgraded LTGs (see goal section below).  Pt agrees with progress.  Attempted tossing ball and catching with RUE with max difficulty and compensation; therefore, discontinued.      OT Education - 09/28/18 0847    Education Details  Coordination HEP, Red putty HEP    Person(s) Educated  Patient    Methods  Explanation;Demonstration;Verbal cues;Handout    Comprehension  Verbalized understanding;Returned demonstration;Verbal cues required       OT Short Term Goals - 09/28/18 0753      OT SHORT TERM GOAL #1   Title  Pt will be independent with initial HEP.--check STG 10/22/18    Time  6    Period  Weeks    Status  Achieved      OT SHORT TERM GOAL #2   Title  Pt will be able to bathe mod I.    Time  6    Period  Weeks    Status  Achieved      OT SHORT TERM GOAL #3   Title  Pt will be able to complete clothing fasteners mod I.    Time  6  Period  Weeks    Status  Achieved   09/28/18:  incr time     OT SHORT TERM GOAL #4   Title  Pt will be able to cut food mod I.    Time  6    Period  Weeks    Status  Achieved   09/28/18:  able to perform with fork/knife but no chopping/peeling     OT SHORT TERM GOAL #5   Title  Pt will demo at least 60* R shoulder flex with only min compensation for improved functional reach.    Baseline  45* with mod compensation    Period  Weeks    Status  Achieved   09/28/18:  WNL     OT SHORT TERM GOAL #6   Title  Pt will be able to use RUE as stabilizer consistently for ADLs.    Time  6    Period  Weeks    Status  Achieved        OT Long Term Goals - 09/28/18 0757      OT LONG TERM GOAL #1   Title  Pt will be independent with updated HEP.--check LTGs 12/06/18    Time  12    Period  Weeks    Status  On-going      OT LONG TERM GOAL #2   Title  Pt will perform simple cooking tasks mod I.--09/28/18 upgraded to mod complex  cooking involving carrying pots/pans and chopping/peeling with RUE    Time  12    Period  Weeks    Status  Revised   09/28/18:  met initial goal and upgraded     OT LONG TERM GOAL #3   Title  Pt will perform mod complex home maintenance tasks mod I.    Time  12    Period  Weeks    Status  On-going      OT LONG TERM GOAL #4   Title  Pt will use RUE as nondominant assist at least 75% of the time for ADLs.---upgraded 09/28/18:  will use RUE 100% of the time for previous ADL/IADL tasks.    Time  8    Period  Weeks    Status  Revised   09/28/18:  met initial goal and upgraded     OT LONG TERM GOAL #5   Title  Pt will demo at least 90* R shoulder flex for functional reach with min compensation.  09/28/18:  Pt will be able to retrieve 2-3lb object from overhead shelf with good control x3.      Period  Weeks    Status  Revised   09/28/18:  initial goal met and upgraded     OT LONG TERM GOAL #6   Title  Pt will be able to carry light bag of groceries with RUE.--09/28/18  upgrade to 5lb bag of groceries    Time  12    Period  Weeks    Status  Revised   09/28/18:  met initial goal and upgraded     OT LONG TERM GOAL #7   Title  Pt will demo improved coordination/RUE functional reach as shown by scoring at least 30 on box and blocks test.--09/28/18:  upgrade to 40 blocks    Time  12    Period  Weeks    Status  Revised   09/28/18:  30 blocks     OT LONG TERM GOAL #8   Title  Pt will demo at least  35lbs R grip strength for opening containers/carrying objects.--09/28/18 upgrade to 45lbs     Baseline  22lbs    Time  12    Period  Weeks    Status  Revised   09/28/18:  34lbs     OT LONG TERM GOAL  #9   Baseline  Pt will improve coordination with R dominant UE as shown by completing 9-hole peg test in less than 60sec.    Time  12    Period  Weeks    Status  New   Goal added 09/28/18 (baseline 102.28sec):           Plan - 09/28/18 0845    Clinical Impression Statement  Pt has made  excellent progress with RUE functional use and has met all STGs met and most LTGs met.  Therefore upgraded LTGs (see goals section for updated goals).  Pt would benefit from additional occupational therapy to improve RUE functional use for ADLs/IADLs.    Occupational Profile and client history currently impacting functional performance  Pt was very independent and active prior to surgery.  Pt drove, performed yardwork, most cooking/home maintenance tasks, and enjoyed outdoor leisure activities.  Pt is currently not able to drive, perform yardwork, perform previous cooking/home maintenance tasks or leisure tasks.  Pt's goal is to return to 90-100% of "normal"    Occupational performance deficits (Please refer to evaluation for details):  ADL's;IADL's;Social Participation;Leisure    Rehab Potential  Good    OT Frequency  2x / week    OT Duration  12 weeks    OT Treatment/Interventions  Self-care/ADL training;Cryotherapy;Paraffin;Therapeutic exercise;DME and/or AE instruction;Functional Mobility Training;Cognitive remediation/compensation;Balance training;Splinting;Manual Therapy;Neuromuscular education;Fluidtherapy;Ultrasound;Electrical Stimulation;Aquatic Therapy;Moist Heat;Energy conservation;Passive range of motion;Therapeutic activities;Patient/family education    Plan  neuro re-ed RUE, RUE functional use, wt. bearing, coordination (tossing/catching ball with BUEs).    OT Home Exercise Plan  Education provided:  edema management, proper positioning of RUE, initial HEP with ball, red putty HEP, coordination HEP    Consulted and Agree with Plan of Care  Patient       Patient will benefit from skilled therapeutic intervention in order to improve the following deficits and impairments:  Abnormal gait, Decreased balance, Decreased endurance, Decreased mobility, Decreased range of motion, Decreased cognition, Decreased activity tolerance, Decreased coordination, Decreased knowledge of use of DME, Decreased  strength, Impaired flexibility, Impaired UE functional use  Visit Diagnosis: Other lack of coordination  Abnormal posture  Other abnormalities of gait and mobility  Unsteadiness on feet  Hemiplegia and hemiparesis following cerebral infarction affecting right non-dominant side ([facility])  Muscle weakness (generalized)    Problem List Patient Active Problem List   Diagnosis Date Noted  . Other abnormalities of gait and mobility 09/17/2018  . Acute cerebral infarction ([facility])   . Thrombocytopenia ([place])   . 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 Acute blood loss anemia   . Generalized OA   . Gastroesophageal reflux disease   . Seizure prophylaxis   . Leukocytosis   . Tumor 08/18/2018  . 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 MACULAR DEGENERATION 02/29/2008  . ALLERGY 02/29/2008  . Nonspecific (abnormal) findings on radiological and other examination of body structure 12/26/2007  . NONSPECIFIC ABNORM FIND RAD&OTH EXAM LUNG FIELD 12/26/2007  . GALLSTONES 10/01/2007  . ELEVATION, TRANSAMINASE/LDH LEVELS 09/06/2007  . HEPATITIS NOS 09/01/2007  . ABDOMINAL PAIN, EPIGASTRIC 09/01/2007    [facility] 09/28/2018, 8:58 AM  [facility] [address] [place], [place], [address] Phone: [phone]   Fax:  [phone]  Name: James Olsen MRN: [id number] Date of Birth: Jan 28, 1946   Vianne Bulls, OTR/L [facility] [address] [place], [place]  [address] [phone] phone [phone] 09/28/18 8:58 AM

## 2018-09-28 NOTE — Therapy (Signed)
[facility] [address] [place], [place], [address] Phone: [phone]   Fax:  [phone]  Physical Therapy Treatment  Patient Details  Name: James Olsen MRN: [id number] Date of Birth: [birth year] Referring Provider (PT): Dr. Alger Simons   Encounter Date: 09/28/2018  PT End of Session - 09/28/18 1141    Visit Number  5    Number of Visits  16    Date for PT Re-Evaluation  11/06/18    Authorization Type  Medicare Part A&B- Medicare progress note every 10th visit    Authorization Time Period  Follow medicare guidelines     PT Start Time  0849    PT Stop Time  0928    PT Time Calculation (min)  39 min    Equipment Utilized During Treatment  Gait belt    Activity Tolerance  Patient tolerated treatment well    Behavior During Therapy  [facility] for tasks assessed/performed       Past Medical History:  Diagnosis Date  . Arthritis   . GERD (gastroesophageal reflux disease)   . Glaucoma   . Macular degeneration   . Wears glasses     Past Surgical History:  Procedure Laterality Date  . CHOLECYSTECTOMY  2008   lap choli  . COLONOSCOPY    . CRANIOTOMY N/A 08/18/2018   Procedure: CRANIOTOMY TUMOR EXCISION;  Surgeon: [name], MD;  Location: [facility];  Service: Neurosurgery;  Laterality: N/A;  CRANIOTOMY TUMOR EXCISION  . NAILBED REPAIR Left 05/04/2014   Procedure: LEFT INDEX NAIL ABLATION V-Y FLAP COVERAGE;  Surgeon: [name]., MD;  Location: [facility];  Service: Orthopedics;  Laterality: Left;  index  . PILONIDAL CYST EXCISION     x2  . TONSILLECTOMY    . VASECTOMY      There were no vitals filed for this visit.  Subjective Assessment - 09/28/18 0851    Subjective  Pt denied falls since last visit. Pt reported intermittent N/T in B LEs (starting just distal to knees, traveling down to feet), after static/semi-static standing for prolonged periods of time (approx. 5 mintues).  Pt  has contacted Psychologist, sport and exercise.  Pt reported new HEP has been going well but didn't perform several of them.     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  No/denies                            Balance Exercises - 09/28/18 0912      Balance Exercises: Standing   Rockerboard  Anterior/posterior;Head turns;EO;20 seconds;5 reps;Lateral;10 seconds;10 reps;Intermittent UE support;Other (comment)   cone taps x10/LE/intermittent UE support   Tandem Gait  Forward;4 reps;Intermittent upper extremity support;Foam/compliant surface   beam   Sidestepping  Foam/compliant support;4 reps    Other Standing Exercises  Performed in // bars with min guard to min A for safety. Cues to improve weight shifting and technique. Performed to improve proprioception and vestibular input, as pt experiencing intermittent N/T in B LE (feet). One seated rest break to allow N/T to subside after standing approx. 15 minutes.         PT Education - 09/28/18 1143    Education Details  PT encouraged pt f/u again with surgeon re: onset  of BLE N/T, which was present prior to tumor extraction.     Person(s) Educated  Patient    Methods  Explanation    Comprehension  Verbalized understanding       PT Short Term Goals - 09/07/18 1255      PT SHORT TERM GOAL #1   Title  Pt will demonstrate independence and compliancy with HEP to further progress towards improving strength and functional mobility     Baseline  Dependent     Time  4    Period  Weeks    Status  New    Target Date  10/07/18      PT SHORT TERM GOAL #2   Title  Pt will improve Berg Balance Assessment by 3 points indicating minimal detectable change and decreased fall risk    Baseline  46/51 on 9/17    Time  4    Period  Weeks    Status  New    Target Date  10/07/18       PT SHORT TERM GOAL #3   Title  Pt will improve gait speed to 4.08 ft/sec using LRAD and supervision to improve functional mobility     Baseline  3.26 ft/sec on 9/17    Time  4    Period  Weeks    Status  New    Target Date  10/07/18      PT SHORT TERM GOAL #4   Title  Pt will improve FGA score by 4 points to increase dynamic standing during functional gait using no AD    Baseline  12/30 on 9/17    Time  4    Period  Weeks    Status  New    Target Date  10/07/18      PT SHORT TERM GOAL #5   Title  Pt will ambulate 300 feet with LRAD ambulating over uneven surfaces, curbs, ramps, and demonstrate ability to vary gait speed to improve community accessibility safely     Time  4    Period  Weeks    Status  New    Target Date  10/07/18        PT Long Term Goals - 09/07/18 1301      PT LONG TERM GOAL #1   Title  Pt will demonstrate independence with HEP to further progress towards increasing strength and functional mobility     Time  8    Period  Weeks    Status  New    Target Date  11/06/18      PT LONG TERM GOAL #2   Title  Pt will improve Berg Balance Score to >50/56 to decrease fall risk potential     Time  8    Period  Weeks    Status  New    Target Date  10/07/18      PT LONG TERM GOAL #3   Title  Pt will improve FGA score to >17/30 to improve safety and balance during functional mobility with no AD and supervision     Time  8    Period  Weeks    Status  New    Target Date  11/06/18      PT LONG TERM GOAL #4   Title  Pt will ambulate outdoors over uneven surfaces, curbs, ramps, inclines, and demonstrate ability to vary gait speed with LRAD and Mod I to improve safety with community accessibility     Time  8  Period  Weeks    Status  New    Target Date  11/06/18      PT LONG TERM GOAL #5   Title  Pt will negotiate 12 steps using single HR and recirprocal stepping technique, mod I to increase safety and independence with community accessibility indicating  improvement in LE strength    Time  8    Period  Weeks    Status  New    Target Date  11/06/18      Additional Long Term Goals   Additional Long Term Goals  Yes      PT LONG TERM GOAL #6   Title  Pt will improve gait speed to >/= to 4.37 ft/sec indicating normal walking speed with Mod I and LRAD to increase independence with functional mobility     Time  8    Period  Weeks    Status  New    Target Date  11/06/18            Plan - 09/28/18 1142    Clinical Impression Statement  Today's skilled session focused on improving pt's vestibular and visual input during balance activities, as pt reported intermittent BLE N/T started again over the weekend. Pt has contacted surgeon re: N/T. PT required incr. assist or UE support in // bars during activities which challenged vestibular system (eyes closed, head turns). Pt demonstrated progress, as he was able to progress from 1 UE support during rockerboard cone taps to no UE support. Pt required one seated rest break during session 2/2 BLE N/T began after approx. 15 minutes of standing balance activities. Pt would continue to benefit from skilled PT to improve safety during functional mobility.     Rehab Potential  Good    Clinical Impairments Affecting Rehab Potential  R sided weakness with difficulty negotiating obstacles and performing high level balance    PT Frequency  2x / week    PT Duration  8 weeks    PT Treatment/Interventions  ADLs/Self Care Home Management;Gait training;Neuromuscular re-education;Functional mobility training;Stair training;Cognitive remediation;Energy conservation;Patient/family education;Therapeutic activities;Therapeutic exercise;Orthotic Fit/Training;Balance training;DME Instruction    PT Next Visit Plan  floor recovery with quadriped and tall kneeling exercises on floor prior to recovery, high level balance, rocker board with cone tapping, foam with pertubations, bosu ball for hip strategy, ramp with compliant  surface, step ups, eccentric quads, glut med strengthening, STS with L LE staggered, bridging, obstacle course/ compliant surface with items under, community ambulation, hurdles, SLS with cone tapping all around,     PT Home Exercise Plan  [id number] & [id number]    Consulted and Agree with Plan of Care  Patient       Patient will benefit from skilled therapeutic intervention in order to improve the following deficits and impairments:  Abnormal gait, Decreased cognition, Decreased knowledge of use of DME, Improper body mechanics, Decreased mobility, Postural dysfunction, Decreased strength, Impaired perceived functional ability, Impaired UE functional use, Decreased balance, Decreased safety awareness, Difficulty walking  Visit Diagnosis: Unsteadiness on feet  Other abnormalities of gait and mobility  Other lack of coordination  Abnormal posture     Problem List Patient Active Problem List   Diagnosis Date Noted  . Other abnormalities of gait and mobility 09/17/2018  . Acute cerebral infarction ([facility])   . Thrombocytopenia ([facility])   . 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 Acute blood loss anemia   . Generalized OA   .  Gastroesophageal reflux disease   . Seizure prophylaxis   . Leukocytosis   . Tumor 08/18/2018  . Meningioma determined by biopsy of brain ([facility]) 08/18/2018  . MACULAR DEGENERATION 02/29/2008  . ALLERGY 02/29/2008  . Nonspecific (abnormal) findings on radiological and other examination of body structure 12/26/2007  . NONSPECIFIC ABNORM FIND RAD&OTH EXAM LUNG FIELD 12/26/2007  . GALLSTONES 10/01/2007  . ELEVATION, TRANSAMINASE/LDH LEVELS 09/06/2007  . HEPATITIS NOS 09/01/2007  . ABDOMINAL PAIN, EPIGASTRIC 09/01/2007    Shakura Cowing L 09/28/2018, 11:48 AM  [facility] [address] [place], [place], [address] Phone: [phone]   Fax:  [phone]  Name:  James Olsen MRN: [id number] Date of Birth: Mar 06, 1946  Geoffry Paradise, PT,DPT 09/28/18 11:48 AM Phone: [phone] Fax: [phone]

## 2018-09-28 NOTE — Patient Instructions (Signed)
11. Grip Strengthening (Resistive Putty)   Squeeze putty using thumb and all fingers. Repeat 20 times. Do 1-2 sessions per day.   Extension (Assistive Putty)   Roll putty back and forth, being sure to use all fingertips. Repeat 3 times. Do 1-2 sessions per day.  Then pinch as below.   Palmar Pinch Strengthening (Resistive Putty)   Pinch putty between thumb and each fingertip in turn after rolling out    Finger and Thumb Extension (Resistive Putty)   With thumb and all fingers in center of putty donut, stretch out. Repeat 10 times. Do 1-2 sessions per day.      Coordination Activities  Perform the following activities for 15-20 minutes 1-2 times per day with right hand(s).   Rotate ball in fingertips (clockwise and counter-clockwise).  Toss ball in air and catch with the same hand.  Flip cards 1 at a time as fast as you can.  Deal cards with your thumb (Hold deck in hand and push card off top with thumb).  Pick up coins and place in container or coin bank.  Pick up coins and stack.  Pick up coins one at a time until you get 5-10 in your hand, then move coins from palm to fingertips to put in coin bank/container one at a time.  Practice writing and/or typing.

## 2018-09-29 NOTE — Telephone Encounter (Signed)
Medical question

## 2018-09-30 ENCOUNTER — Encounter: Payer: Self-pay | Admitting: Occupational Therapy

## 2018-09-30 ENCOUNTER — Encounter: Payer: Self-pay | Admitting: Physical Therapy

## 2018-09-30 ENCOUNTER — Ambulatory Visit: Payer: Medicare Other | Admitting: Occupational Therapy

## 2018-09-30 ENCOUNTER — Ambulatory Visit: Payer: Medicare Other | Admitting: Physical Therapy

## 2018-09-30 DIAGNOSIS — R293 Abnormal posture: Secondary | ICD-10-CM

## 2018-09-30 DIAGNOSIS — R278 Other lack of coordination: Secondary | ICD-10-CM

## 2018-09-30 DIAGNOSIS — R2681 Unsteadiness on feet: Secondary | ICD-10-CM

## 2018-09-30 DIAGNOSIS — M6281 Muscle weakness (generalized): Secondary | ICD-10-CM

## 2018-09-30 DIAGNOSIS — I69353 Hemiplegia and hemiparesis following cerebral infarction affecting right non-dominant side: Secondary | ICD-10-CM

## 2018-09-30 NOTE — Therapy (Signed)
[facility] [address] [place], [place], [address] Phone: [phone]   Fax:  [phone]  Physical Therapy Treatment  Patient Details  Name: James Olsen MRN: [id number] Date of Birth: [birth year] Referring Provider (PT): Dr. Alger Simons   Encounter Date: 09/30/2018  PT End of Session - 09/30/18 1207    Visit Number  6    Number of Visits  16    Date for PT Re-Evaluation  11/06/18    Authorization Type  Medicare Part A&B- Medicare progress note every 10th visit    Authorization Time Period  Follow medicare guidelines     PT Start Time  0930    PT Stop Time  1015    PT Time Calculation (min)  45 min    Equipment Utilized During Treatment  Gait belt    Activity Tolerance  Patient tolerated treatment well    Behavior During Therapy  [facility] for tasks assessed/performed       Past Medical History:  Diagnosis Date  . Arthritis   . GERD (gastroesophageal reflux disease)   . Glaucoma   . Macular degeneration   . Wears glasses     Past Surgical History:  Procedure Laterality Date  . CHOLECYSTECTOMY  2008   lap choli  . COLONOSCOPY    . CRANIOTOMY N/A 08/18/2018   Procedure: CRANIOTOMY TUMOR EXCISION;  Surgeon: [name], MD;  Location: [facility];  Service: Neurosurgery;  Laterality: N/A;  CRANIOTOMY TUMOR EXCISION  . NAILBED REPAIR Left 05/04/2014   Procedure: LEFT INDEX NAIL ABLATION V-Y FLAP COVERAGE;  Surgeon: [name]., MD;  Location: [facility];  Service: Orthopedics;  Laterality: Left;  index  . PILONIDAL CYST EXCISION     x2  . TONSILLECTOMY    . VASECTOMY      There were no vitals filed for this visit.  Subjective Assessment - 09/30/18 0930    Subjective  Pt reports no longer experiencing N/T in legs since Sunday. Reports his L index finger is beginning to tingle which he believes may be a good thing and OT told him they would pass along the information to his doctor.  Reports difficulty performing strengthening HEP exercises 2/2 to his kittens running under his legs creating issues with safety.     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  No/denies                       [facility] Adult PT Treatment/Exercise - 09/30/18 1155      Transfers   Transfers  Sit to Stand;Stand to Sit    Sit to Stand  4: Min guard    Stand to Sit  4: Min guard    Comments  Pt performs 1x10 reps of STS from blue ball to increase postural stability during functional task. Pt progresses to performing 1x10 reps with L LE staggered in front to increase R LE WB. Pt then progresses to 1x10 reps with L LE staggered while holding 5.5# wt to increase core activation and R LE WB during functional task. Therapist provided min guard throughout activity duration and provided VC's to improve posture and technique.       Balance   Balance Assessed  Yes    Balance comment  Pt performs dynamic sitting balance while sitting on large blue ball to improve trunk control with therapist providing min guard throughout activity duration. Pt performs 1x10 reps of alternating LE marching progressing to 1x10 reps of alternating marching with eyes closed for increased challenge with internal & external pertubations. Pt then progresses to 1x10 reps of alternating marching with eyes closed while holding 5# weight with B UE's. Pt demonstrates increased in lateral sway with ability to self correct for improved sitting posture.       Dynamic Sitting Balance   Dynamic Sitting - Balance Activities  Trunk control activities      Dynamic Standing Balance   Dynamic Standing - Comments  Pt performs dynamic standing balance on rocker board with alternating cone taps while therapist provides min guard  in // bars. Pt demonstrates increased difficulty with LLE stance and R LE cone tapping 2/2 to glut med weakness and difficulty with coordination. Pt demonstrates x1 LOB episode with ability to recover using UE's on bars. Pt will benefit from performing this activity while standing on foam pad to improve LE coordination during dynamic standing balance.       Exercises   Exercises  Lumbar      Lumbar Exercises: Quadruped   Straight Leg Raise  10 reps;3 seconds   1 set/ 10 reps with 2 second hold   Opposite Arm/Leg Raise  Right arm/Left leg;Left arm/Right leg;10 reps;2 seconds   1 set total    Other Quadruped Lumbar Exercises  Pt performs hip abduction in quadriped for 2 sets/ 10 reps with increased difficulty performing on R LE 2/2 weakness requiring exercise modification for proper technique             PT Education - 09/30/18 1206    Education Details  Therapist provided education regarding problem solving to assist with performing HEP exercises at home with regards to safety. Pt verbalizes understanding and will attempt to integrate HEP exercises into busy schedule to continue progressing with LE strength, balance, and functional mobility.     Person(s) Educated  Patient    Methods  Explanation    Comprehension  Verbalized understanding       PT Short Term Goals - 09/07/18 1255      PT SHORT TERM GOAL #1   Title  Pt will demonstrate independence and compliancy with HEP to further progress towards improving strength and functional mobility     Baseline  Dependent     Time  4    Period  Weeks    Status  New    Target Date  10/07/18      PT SHORT TERM GOAL #2   Title  Pt will improve Berg Balance Assessment by 3 points indicating minimal detectable change and decreased fall risk    Baseline  46/51 on 9/17    Time  4    Period  Weeks    Status  New    Target Date  10/07/18      PT SHORT TERM GOAL #3   Title  Pt will improve gait speed to 4.08 ft/sec using LRAD and  supervision to improve functional mobility     Baseline  3.26 ft/sec on 9/17    Time  4    Period  Weeks    Status  New    Target Date  10/07/18      PT SHORT TERM GOAL #4   Title  Pt will improve  FGA score by 4 points to increase dynamic standing during functional gait using no AD    Baseline  12/30 on 9/17    Time  4    Period  Weeks    Status  New    Target Date  10/07/18      PT SHORT TERM GOAL #5   Title  Pt will ambulate 300 feet with LRAD ambulating over uneven surfaces, curbs, ramps, and demonstrate ability to vary gait speed to improve community accessibility safely     Time  4    Period  Weeks    Status  New    Target Date  10/07/18        PT Long Term Goals - 09/07/18 1301      PT LONG TERM GOAL #1   Title  Pt will demonstrate independence with HEP to further progress towards increasing strength and functional mobility     Time  8    Period  Weeks    Status  New    Target Date  11/06/18      PT LONG TERM GOAL #2   Title  Pt will improve Berg Balance Score to >50/56 to decrease fall risk potential     Time  8    Period  Weeks    Status  New    Target Date  10/07/18      PT LONG TERM GOAL #3   Title  Pt will improve FGA score to >17/30 to improve safety and balance during functional mobility with no AD and supervision     Time  8    Period  Weeks    Status  New    Target Date  11/06/18      PT LONG TERM GOAL #4   Title  Pt will ambulate outdoors over uneven surfaces, curbs, ramps, inclines, and demonstrate ability to vary gait speed with LRAD and Mod I to improve safety with community accessibility     Time  8    Period  Weeks    Status  New    Target Date  11/06/18      PT LONG TERM GOAL #5   Title  Pt will negotiate 12 steps using single HR and recirprocal stepping technique, mod I to increase safety and independence with community accessibility indicating improvement in LE strength    Time  8    Period  Weeks    Status  New    Target Date   11/06/18      Additional Long Term Goals   Additional Long Term Goals  Yes      PT LONG TERM GOAL #6   Title  Pt will improve gait speed to >/= to 4.37 ft/sec indicating normal walking speed with Mod I and LRAD to increase independence with functional mobility     Time  8    Period  Weeks    Status  New    Target Date  11/06/18            Plan - 09/30/18 1207    Clinical Impression Statement  Today's skilled session focused on LE strengthening, trunk stability with increased core activation during functional mobility tasks on unstable surfaces, and dynamic standing balance in the // bars. Pt demonstrates improvement with LE strength and coordination; however, continues to demonstrates R LE weakness and will benefit from strengthening in weightbearing positions. Pt demonstrates difficulty with R LE coordination indicated by increased challenge with cone tapping while  standing on rocker board requiring intermittant UE assist and verbal cues for motor control during task performance. Pt will continue to benefit from skilled PT to address strength, balance, and functional mobility deficits.     Rehab Potential  Good    Clinical Impairments Affecting Rehab Potential  R sided weakness with difficulty negotiating obstacles and performing high level balance    PT Frequency  2x / week    PT Duration  8 weeks    PT Treatment/Interventions  ADLs/Self Care Home Management;Gait training;Neuromuscular re-education;Functional mobility training;Stair training;Cognitive remediation;Energy conservation;Patient/family education;Therapeutic activities;Therapeutic exercise;Orthotic Fit/Training;Balance training;DME Instruction    PT Next Visit Plan   tall kneeling exercises, core stabilization, high level balance, rocker board with cone tapping, foam with pertubations, bosu ball for hip strategy, ramp with compliant surface, step ups, eccentric quads, glut med strengthening, STS with L LE staggered, bridging,  obstacle course/ compliant surface with items under, community ambulation, hurdles, SLS with cone tapping all around,     PT Home Exercise Plan  [id number] & [id number]    Consulted and Agree with Plan of Care  Patient       Patient will benefit from skilled therapeutic intervention in order to improve the following deficits and impairments:  Abnormal gait, Decreased cognition, Decreased knowledge of use of DME, Improper body mechanics, Decreased mobility, Postural dysfunction, Decreased strength, Impaired perceived functional ability, Impaired UE functional use, Decreased balance, Decreased safety awareness, Difficulty walking  Visit Diagnosis: Muscle weakness (generalized)  Abnormal posture     Problem List Patient Active Problem List   Diagnosis Date Noted  . Other abnormalities of gait and mobility 09/17/2018  . Acute cerebral infarction ([facility])   . Thrombocytopenia ([facility])   . 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 Acute blood loss anemia   . Generalized OA   . Gastroesophageal reflux disease   . Seizure prophylaxis   . Leukocytosis   . Tumor 08/18/2018  . Meningioma determined by biopsy of brain ([facility]) 08/18/2018  . MACULAR DEGENERATION 02/29/2008  . ALLERGY 02/29/2008  . Nonspecific (abnormal) findings on radiological and other examination of body structure 12/26/2007  . NONSPECIFIC ABNORM FIND RAD&OTH EXAM LUNG FIELD 12/26/2007  . GALLSTONES 10/01/2007  . ELEVATION, TRANSAMINASE/LDH LEVELS 09/06/2007  . HEPATITIS NOS 09/01/2007  . ABDOMINAL PAIN, EPIGASTRIC 09/01/2007    Floreen Comber, SPT 09/30/2018, 12:11 PM  [facility] [address] [place], [place], [address] Phone: [phone]   Fax:  [phone]  Name: JLEN WINTLE MRN: [id number] Date of Birth: 1946/01/11

## 2018-09-30 NOTE — Therapy (Signed)
[facility] [address] [place], [place], [address] Phone: [phone]   Fax:  [phone]  Occupational Therapy Treatment  Patient Details  Name: James Olsen MRN: [id number] Date of Birth: 1946/03/28 No data recorded  Encounter Date: 09/30/2018  OT End of Session - 09/30/18 0945    Visit Number  8    Number of Visits  25    Date for OT Re-Evaluation  12/06/18    Authorization Type  cert. 09/07/18-12/06/18    Authorization Time Period  Medicare & [employer], covered 100%    Authorization - Visit Number  8    Authorization - Number of Visits  10    OT Start Time  0845    OT Stop Time  0931    OT Time Calculation (min)  46 min    Activity Tolerance  Patient tolerated treatment well       Past Medical History:  Diagnosis Date  . Arthritis   . GERD (gastroesophageal reflux disease)   . Glaucoma   . Macular degeneration   . Wears glasses     Past Surgical History:  Procedure Laterality Date  . CHOLECYSTECTOMY  2008   lap choli  . COLONOSCOPY    . CRANIOTOMY N/A 08/18/2018   Procedure: CRANIOTOMY TUMOR EXCISION;  Surgeon: [name], MD;  Location: [facility];  Service: Neurosurgery;  Laterality: N/A;  CRANIOTOMY TUMOR EXCISION  . NAILBED REPAIR Left 05/04/2014   Procedure: LEFT INDEX NAIL ABLATION V-Y FLAP COVERAGE;  Surgeon: [name]., MD;  Location: [name];  Service: Orthopedics;  Laterality: Left;  index  . PILONIDAL CYST EXCISION     x2  . TONSILLECTOMY    . VASECTOMY      There were no vitals filed for this visit.  Subjective Assessment - 09/30/18 0847    Subjective   I have noticed in the last week I have tingling now in my index finger of my left hand. (pt had prior amputation at [facility] however reports that this was a few years ago and tingling had stopped from that well before the surgery).     Pertinent History  Meningioma s/p craniotomy for tumor resection 08/18/18, Acute  cerebral infarction; arthritis, macular degeneration, hepatitis, GERD    Patient Stated Goals  be back to normal or 95%    Currently in Pain?  No/denies                   OT Treatments/Exercises (OP) - 09/30/18 0001      Neurological Re-education Exercises   Other Exercises 2  Neuro re ed in  sidelying and sitting to address scapular alignment and proximal posterior activation to improve stability and stable mobility for overhead reach (R scapula positioned in adduction, elevation and downward rotation.  Use of manual resistance intermittently throughout the arc of movement as well as focus on sustained motor persistence.  Incorporated ER with 1 pound weight in sidelying as well. Transitioned to supine and instructed pt in ER stretch -pt able to return demonstrate after practice.  Transitioned into sittng and addresed increased proximal posterior activation utilizing forced use in closed chain activity and progressing to demand for sustained holding with weight for unilateral overhead reach.               OT Education - 09/30/18 0943    Education Details  ER stretch for RUE    Person(s) Educated  Patient    Methods  Explanation;Demonstration    Comprehension  Verbalized understanding;Returned demonstration       OT Short Term Goals - 09/28/18 0753      OT SHORT TERM GOAL #1   Title  Pt will be independent with initial HEP.--check STG 10/22/18    Time  6    Period  Weeks    Status  Achieved      OT SHORT TERM GOAL #2   Title  Pt will be able to bathe mod I.    Time  6    Period  Weeks    Status  Achieved      OT SHORT TERM GOAL #3   Title  Pt will be able to complete clothing fasteners mod I.    Time  6    Period  Weeks    Status  Achieved   09/28/18:  incr time     OT SHORT TERM GOAL #4   Title  Pt will be able to cut food mod I.    Time  6    Period  Weeks    Status  Achieved   09/28/18:  able to perform with fork/knife but no chopping/peeling     OT  SHORT TERM GOAL #5   Title  Pt will demo at least 60* R shoulder flex with only min compensation for improved functional reach.    Baseline  45* with mod compensation    Period  Weeks    Status  Achieved   09/28/18:  WNL     OT SHORT TERM GOAL #6   Title  Pt will be able to use RUE as stabilizer consistently for ADLs.    Time  6    Period  Weeks    Status  Achieved        OT Long Term Goals - 09/28/18 0757      OT LONG TERM GOAL #1   Title  Pt will be independent with updated HEP.--check LTGs 12/06/18    Time  12    Period  Weeks    Status  On-going      OT LONG TERM GOAL #2   Title  Pt will perform simple cooking tasks mod I.--09/28/18 upgraded to mod complex cooking involving carrying pots/pans and chopping/peeling with RUE    Time  12    Period  Weeks    Status  Revised   09/28/18:  met initial goal and upgraded     OT LONG TERM GOAL #3   Title  Pt will perform mod complex home maintenance tasks mod I.    Time  12    Period  Weeks    Status  On-going      OT LONG TERM GOAL #4   Title  Pt will use RUE as nondominant assist at least 75% of the time for ADLs.---upgraded 09/28/18:  will use RUE 100% of the time for previous ADL/IADL tasks.    Time  8    Period  Weeks    Status  Revised   09/28/18:  met initial goal and upgraded     OT LONG TERM GOAL #5   Title  Pt will demo at least 90* R shoulder flex for functional reach with min compensation.  09/28/18:  Pt will be able to retrieve 2-3lb object from overhead shelf with good control x3.      Period  Weeks    Status  Revised   09/28/18:  initial goal met and upgraded  OT LONG TERM GOAL #6   Title  Pt will be able to carry light bag of groceries with RUE.--09/28/18  upgrade to 5lb bag of groceries    Time  12    Period  Weeks    Status  Revised   09/28/18:  met initial goal and upgraded     OT LONG TERM GOAL #7   Title  Pt will demo improved coordination/RUE functional reach as shown by scoring at least 30 on box  and blocks test.--09/28/18:  upgrade to 40 blocks    Time  12    Period  Weeks    Status  Revised   09/28/18:  30 blocks     OT LONG TERM GOAL #8   Title  Pt will demo at least 35lbs R grip strength for opening containers/carrying objects.--09/28/18 upgrade to 45lbs     Baseline  22lbs    Time  12    Period  Weeks    Status  Revised   09/28/18:  34lbs     OT LONG TERM GOAL  #9   Baseline  Pt will improve coordination with R dominant UE as shown by completing 9-hole peg test in less than 60sec.    Time  12    Period  Weeks    Status  New   Goal added 09/28/18 (baseline 102.28sec):           Plan - 09/30/18 0944    Clinical Impression Statement  Pt progressing toward goals. Pt very motivated to continue to improve functional use of RUE.  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 Will notify surgeon and PMR MD.    Occupational Profile and client history currently impacting functional performance  Pt was very independent and active prior to surgery.  Pt drove, performed yardwork, most cooking/home maintenance tasks, and enjoyed outdoor leisure activities.  Pt is currently not able to drive, perform yardwork, perform previous cooking/home maintenance tasks or leisure tasks.  Pt's goal is to return to 90-100% of "normal"    Occupational performance deficits (Please refer to evaluation for details):  ADL's;IADL's;Social Participation;Leisure    Rehab Potential  Good    OT Frequency  2x / week    OT Duration  12 weeks    OT Treatment/Interventions  Self-care/ADL training;Cryotherapy;Paraffin;Therapeutic exercise;DME and/or AE instruction;Functional Mobility Training;Cognitive remediation/compensation;Balance training;Splinting;Manual Therapy;Neuromuscular education;Fluidtherapy;Ultrasound;Electrical Stimulation;Aquatic Therapy;Moist Heat;Energy conservation;Passive range of motion;Therapeutic activities;Patient/family education    Plan  neuro  re-ed RUE, RUE functional use, wt. bearing, coordination (tossing/catching ball with BUEs).    Consulted and Agree with Plan of Care  Patient       Patient will benefit from skilled therapeutic intervention in order to improve the following deficits and impairments:  Abnormal gait, Decreased balance, Decreased endurance, Decreased mobility, Decreased range of motion, Decreased cognition, Decreased activity tolerance, Decreased coordination, Decreased knowledge of use of DME, Decreased strength, Impaired flexibility, Impaired UE functional use  Visit Diagnosis: Other lack of coordination  Abnormal posture  Unsteadiness on feet  Hemiplegia and hemiparesis following cerebral infarction affecting right non-dominant side (HCC)  Muscle weakness (generalized)    Problem List Patient Active Problem List   Diagnosis Date Noted  . Other abnormalities of gait and mobility 09/17/2018  . Acute cerebral infarction ([facility])   . Thrombocytopenia ([facility])   . 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 Acute blood loss anemia   .  Generalized OA   . Gastroesophageal reflux disease   . Seizure prophylaxis   . Leukocytosis   . Tumor 08/18/2018  . Meningioma determined by biopsy of brain ([facility]) 08/18/2018  . MACULAR DEGENERATION 02/29/2008  . ALLERGY 02/29/2008  . Nonspecific (abnormal) findings on radiological and other examination of body structure 12/26/2007  . NONSPECIFIC ABNORM FIND RAD&OTH EXAM LUNG FIELD 12/26/2007  . GALLSTONES 10/01/2007  . ELEVATION, TRANSAMINASE/LDH LEVELS 09/06/2007  . HEPATITIS NOS 09/01/2007  . ABDOMINAL PAIN, EPIGASTRIC 09/01/2007    Quay Burow, OTR/L 09/30/2018, 9:47 AM  [facility] [address] [place], [place], [address] Phone: [phone]   Fax:  [phone]  Name: COLBERT CURENTON MRN: [id number] Date of Birth: 1946-08-11

## 2018-09-30 NOTE — Therapy (Signed)
[facility] [address] [place], [place], [address] Phone: [phone]   Fax:  [phone]  Physical Therapy Treatment  Patient Details  Name: James Olsen MRN: [id number] Date of Birth: 1946-03-15 Referring Provider (PT): Dr. Alger Simons   Encounter Date: 09/30/2018  PT End of Session - 09/30/18 1207    Visit Number  6    Number of Visits  16    Date for PT Re-Evaluation  11/06/18    Authorization Type  Medicare Part A&B- Medicare progress note every 10th visit    Authorization Time Period  Follow medicare guidelines     PT Start Time  0930    PT Stop Time  1015    PT Time Calculation (min)  45 min    Equipment Utilized During Treatment  Gait belt    Activity Tolerance  Patient tolerated treatment well    Behavior During Therapy  [facility] for tasks assessed/performed       Past Medical History:  Diagnosis Date  . Arthritis   . GERD (gastroesophageal reflux disease)   . Glaucoma   . Macular degeneration   . Wears glasses     Past Surgical History:  Procedure Laterality Date  . CHOLECYSTECTOMY  2008   lap choli  . COLONOSCOPY    . CRANIOTOMY N/A 08/18/2018   Procedure: CRANIOTOMY TUMOR EXCISION;  Surgeon: [name], MD;  Location: [facility];  Service: Neurosurgery;  Laterality: N/A;  CRANIOTOMY TUMOR EXCISION  . NAILBED REPAIR Left 05/04/2014   Procedure: LEFT INDEX NAIL ABLATION V-Y FLAP COVERAGE;  Surgeon: [name]., MD;  Location: [facility];  Service: Orthopedics;  Laterality: Left;  index  . PILONIDAL CYST EXCISION     x2  . TONSILLECTOMY    . VASECTOMY      There were no vitals filed for this visit.  Subjective Assessment - 09/30/18 0930    Subjective  Pt reports no longer experiencing N/T in legs since Sunday. Reports his L index finger is beginning to tingle which he believes may be a good thing and OT told him they would pass along the information to his doctor.  Reports difficulty performing strengthening HEP exercises 2/2 to his kittens running under his legs creating issues with safety.     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  No/denies                       [facility] Adult PT Treatment/Exercise - 09/30/18 1155      Transfers   Transfers  Sit to Stand;Stand to Sit    Sit to Stand  4: Min guard    Stand to Sit  4: Min guard    Comments  Pt performs 1x10 reps of STS from blue ball to increase postural stability during functional task. Pt progresses to performing 1x10 reps with L LE staggered in front to increase R LE WB. Pt then progresses to 1x10 reps with L LE staggered while holding 5.5# wt to increase core activation and R LE WB during functional task. Therapist provided min guard throughout activity duration and provided VC's to improve posture and technique.       Balance   Balance Assessed  Yes    Balance comment  Pt performs dynamic sitting balance while sitting on large blue ball to improve trunk control with therapist providing min guard throughout activity duration. Pt performs 1x10 reps of alternating LE marching progressing to 1x10 reps of alternating marching with eyes closed for increased challenge with internal & external pertubations. Pt then progresses to 1x10 reps of alternating marching with eyes closed while holding 5# weight with B UE's. Pt demonstrates increased in lateral sway with ability to self correct for improved sitting posture.       Dynamic Sitting Balance   Dynamic Sitting - Balance Activities  Trunk control activities      Dynamic Standing Balance   Dynamic Standing - Comments  Pt performs dynamic standing balance on rocker board with alternating cone taps while therapist provides min guard  in // bars. Pt demonstrates increased difficulty with LLE stance and R LE cone tapping 2/2 to glut med weakness and difficulty with coordination. Pt demonstrates x1 LOB episode with ability to recover using UE's on bars. Pt will benefit from performing this activity while standing on foam pad to improve LE coordination during dynamic standing balance.       Exercises   Exercises  Lumbar      Lumbar Exercises: Quadruped   Straight Leg Raise  10 reps;3 seconds   1 set/ 10 reps with 2 second hold   Opposite Arm/Leg Raise  Right arm/Left leg;Left arm/Right leg;10 reps;2 seconds   1 set total    Other Quadruped Lumbar Exercises  Pt performs hip abduction in quadriped for 2 sets/ 10 reps with increased difficulty performing on R LE 2/2 weakness requiring exercise modification for proper technique             PT Education - 09/30/18 1206    Education Details  Therapist provided education regarding problem solving to assist with performing HEP exercises at home with regards to safety. Pt verbalizes understanding and will attempt to integrate HEP exercises into busy schedule to continue progressing with LE strength, balance, and functional mobility.     Person(s) Educated  Patient    Methods  Explanation    Comprehension  Verbalized understanding       PT Short Term Goals - 09/07/18 1255      PT SHORT TERM GOAL #1   Title  Pt will demonstrate independence and compliancy with HEP to further progress towards improving strength and functional mobility     Baseline  Dependent     Time  4    Period  Weeks    Status  New    Target Date  10/07/18      PT SHORT TERM GOAL #2   Title  Pt will improve Berg Balance Assessment by 3 points indicating minimal detectable change and decreased fall risk    Baseline  46/51 on 9/17    Time  4    Period  Weeks    Status  New    Target Date  10/07/18      PT SHORT TERM GOAL #3   Title  Pt will improve gait speed to 4.08 ft/sec using LRAD and  supervision to improve functional mobility     Baseline  3.26 ft/sec on 9/17    Time  4    Period  Weeks    Status  New    Target Date  10/07/18      PT SHORT TERM GOAL #4   Title  Pt will improve  FGA score by 4 points to increase dynamic standing during functional gait using no AD    Baseline  12/30 on 9/17    Time  4    Period  Weeks    Status  New    Target Date  10/07/18      PT SHORT TERM GOAL #5   Title  Pt will ambulate 300 feet with LRAD ambulating over uneven surfaces, curbs, ramps, and demonstrate ability to vary gait speed to improve community accessibility safely     Time  4    Period  Weeks    Status  New    Target Date  10/07/18        PT Long Term Goals - 09/07/18 1301      PT LONG TERM GOAL #1   Title  Pt will demonstrate independence with HEP to further progress towards increasing strength and functional mobility     Time  8    Period  Weeks    Status  New    Target Date  11/06/18      PT LONG TERM GOAL #2   Title  Pt will improve Berg Balance Score to >50/56 to decrease fall risk potential     Time  8    Period  Weeks    Status  New    Target Date  10/07/18      PT LONG TERM GOAL #3   Title  Pt will improve FGA score to >17/30 to improve safety and balance during functional mobility with no AD and supervision     Time  8    Period  Weeks    Status  New    Target Date  11/06/18      PT LONG TERM GOAL #4   Title  Pt will ambulate outdoors over uneven surfaces, curbs, ramps, inclines, and demonstrate ability to vary gait speed with LRAD and Mod I to improve safety with community accessibility     Time  8    Period  Weeks    Status  New    Target Date  11/06/18      PT LONG TERM GOAL #5   Title  Pt will negotiate 12 steps using single HR and recirprocal stepping technique, mod I to increase safety and independence with community accessibility indicating improvement in LE strength    Time  8    Period  Weeks    Status  New    Target Date   11/06/18      Additional Long Term Goals   Additional Long Term Goals  Yes      PT LONG TERM GOAL #6   Title  Pt will improve gait speed to >/= to 4.37 ft/sec indicating normal walking speed with Mod I and LRAD to increase independence with functional mobility     Time  8    Period  Weeks    Status  New    Target Date  11/06/18            Plan - 09/30/18 1207    Clinical Impression Statement  Today's skilled session focused on LE strengthening, trunk stability with increased core activation during functional mobility tasks on unstable surfaces, and dynamic standing balance in the // bars. Pt demonstrates improvement with LE strength and coordination; however, continues to demonstrates R LE weakness and will benefit from strengthening in weightbearing positions. Pt demonstrates difficulty with R LE coordination indicated by increased challenge with cone tapping while  standing on rocker board requiring intermittant UE assist and verbal cues for motor control during task performance. Pt will continue to benefit from skilled PT to address strength, balance, and functional mobility deficits.     Rehab Potential  Good    Clinical Impairments Affecting Rehab Potential  R sided weakness with difficulty negotiating obstacles and performing high level balance    PT Frequency  2x / week    PT Duration  8 weeks    PT Treatment/Interventions  ADLs/Self Care Home Management;Gait training;Neuromuscular re-education;Functional mobility training;Stair training;Cognitive remediation;Energy conservation;Patient/family education;Therapeutic activities;Therapeutic exercise;Orthotic Fit/Training;Balance training;DME Instruction    PT Next Visit Plan   tall kneeling exercises, core stabilization, high level balance, rocker board with cone tapping, foam with pertubations, bosu ball for hip strategy, ramp with compliant surface, step ups, eccentric quads, glut med strengthening, STS with L LE staggered, bridging,  obstacle course/ compliant surface with items under, community ambulation, hurdles, SLS with cone tapping all around,     PT Home Exercise Plan  [id number] & [id number]    Consulted and Agree with Plan of Care  Patient       Patient will benefit from skilled therapeutic intervention in order to improve the following deficits and impairments:  Abnormal gait, Decreased cognition, Decreased knowledge of use of DME, Improper body mechanics, Decreased mobility, Postural dysfunction, Decreased strength, Impaired perceived functional ability, Impaired UE functional use, Decreased balance, Decreased safety awareness, Difficulty walking  Visit Diagnosis: Muscle weakness (generalized)  Abnormal posture     Problem List Patient Active Problem List   Diagnosis Date Noted  . Other abnormalities of gait and mobility 09/17/2018  . Acute cerebral infarction ([facility])   . Thrombocytopenia ([facility])   . 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 ([facility])   . Acute blood loss anemia   . Generalized OA   . Gastroesophageal reflux disease   . Seizure prophylaxis   . Leukocytosis   . Tumor 08/18/2018  . Meningioma determined by biopsy of brain ([facility]) 08/18/2018  . MACULAR DEGENERATION 02/29/2008  . ALLERGY 02/29/2008  . Nonspecific (abnormal) findings on radiological and other examination of body structure 12/26/2007  . NONSPECIFIC ABNORM FIND RAD&OTH EXAM LUNG FIELD 12/26/2007  . GALLSTONES 10/01/2007  . ELEVATION, TRANSAMINASE/LDH LEVELS 09/06/2007  . HEPATITIS NOS 09/01/2007  . ABDOMINAL PAIN, EPIGASTRIC 09/01/2007   Bjorn Loser, PTA  09/30/18, 12:41 PM [facility] [address] [place], [place], [address] Phone: [phone]   Fax:  [phone]  Name: James Olsen MRN: [id number] Date of Birth: 01/02/1946

## 2018-10-04 ENCOUNTER — Other Ambulatory Visit: Payer: Self-pay | Admitting: Neurosurgery

## 2018-10-06 NOTE — Telephone Encounter (Signed)
Message from patient

## 2018-10-07 ENCOUNTER — Encounter: Payer: Self-pay | Admitting: Physical Therapy

## 2018-10-07 ENCOUNTER — Ambulatory Visit: Payer: Medicare Other | Admitting: Physical Therapy

## 2018-10-07 ENCOUNTER — Ambulatory Visit: Payer: Medicare Other | Admitting: Occupational Therapy

## 2018-10-07 ENCOUNTER — Ambulatory Visit: Payer: Medicare Other

## 2018-10-07 DIAGNOSIS — R2681 Unsteadiness on feet: Secondary | ICD-10-CM

## 2018-10-07 DIAGNOSIS — R41841 Cognitive communication deficit: Secondary | ICD-10-CM

## 2018-10-07 DIAGNOSIS — R4701 Aphasia: Secondary | ICD-10-CM

## 2018-10-07 DIAGNOSIS — I69353 Hemiplegia and hemiparesis following cerebral infarction affecting right non-dominant side: Secondary | ICD-10-CM

## 2018-10-07 DIAGNOSIS — R293 Abnormal posture: Secondary | ICD-10-CM

## 2018-10-07 DIAGNOSIS — M6281 Muscle weakness (generalized): Secondary | ICD-10-CM

## 2018-10-07 DIAGNOSIS — R2689 Other abnormalities of gait and mobility: Secondary | ICD-10-CM

## 2018-10-07 DIAGNOSIS — R278 Other lack of coordination: Secondary | ICD-10-CM

## 2018-10-07 NOTE — Therapy (Signed)
[facility] [address] [place], [place], [address] Phone: [phone]   Fax:  [phone]  Physical Therapy Treatment  Patient Details  Name: James Olsen MRN: [id number] Date of Birth: June 30, 1946 Referring Provider (PT): Dr. Alger Simons   Encounter Date: 10/07/2018  PT End of Session - 10/07/18 0941    Visit Number  7    Number of Visits  16    Date for PT Re-Evaluation  11/06/18    Authorization Type  Medicare Part A&B- Medicare progress note every 10th visit    Authorization Time Period  Follow medicare guidelines     PT Start Time  0845    PT Stop Time  0930    PT Time Calculation (min)  45 min    Equipment Utilized During Treatment  Gait belt    Activity Tolerance  Patient tolerated treatment well    Behavior During Therapy  [facility] for tasks assessed/performed       Past Medical History:  Diagnosis Date  . Arthritis   . GERD (gastroesophageal reflux disease)   . Glaucoma   . Macular degeneration   . Wears glasses     Past Surgical History:  Procedure Laterality Date  . CHOLECYSTECTOMY  2008   lap choli  . COLONOSCOPY    . CRANIOTOMY N/A 08/18/2018   Procedure: CRANIOTOMY TUMOR EXCISION;  Surgeon: [name], MD;  Location: [facility];  Service: Neurosurgery;  Laterality: N/A;  CRANIOTOMY TUMOR EXCISION  . NAILBED REPAIR Left 05/04/2014   Procedure: LEFT INDEX NAIL ABLATION V-Y FLAP COVERAGE;  Surgeon: [name]., MD;  Location: [facility];  Service: Orthopedics;  Laterality: Left;  index  . PILONIDAL CYST EXCISION     x2  . TONSILLECTOMY    . VASECTOMY      There were no vitals filed for this visit.  Subjective Assessment - 10/07/18 0849    Subjective  Reports everything has been going well and feels he has been improving. Reports HEP exercises are going well with no issues.     Pertinent History  OA, GERD, Macular degeneration, Bilateral LE numbness with onset  earlier this year 2/2 left frontal meningioma     Limitations  Walking;Lifting    Patient Stated Goals  Improve motor skills and functional mobility     Currently in Pain?  No/denies         [facility] PT Assessment - 10/07/18 0854      Berg Balance Test   Sit to Stand  Able to stand without using hands and stabilize independently    Standing Unsupported  Able to stand safely 2 minutes    Sitting with Back Unsupported but Feet Supported on Floor or Stool  Able to sit safely and securely 2 minutes    Stand to Sit  Sits safely with minimal use of hands    Transfers  Able to transfer safely, minor use of hands    Standing Unsupported with Eyes Closed  Able to stand 10 seconds safely    Standing Ubsupported with Feet Together  Able to place feet together independently and stand 1 minute safely    From Standing, Reach Forward with Outstretched Arm  Can reach confidently >25 cm (10")    From Standing Position, Pick up Object from Floor  Able to pick up shoe safely and easily    From Standing Position, Turn to Look Behind Over each Shoulder  Looks behind from both sides and  weight shifts well    Turn 360 Degrees  Able to turn 360 degrees safely in 4 seconds or less    Standing Unsupported, Alternately Place Feet on Step/Stool  Able to complete 4 steps without aid or supervision    Standing Unsupported, One Foot in Front  Able to place foot tandem independently and hold 30 seconds    Standing on One Leg  Able to lift leg independently and hold > 10 seconds    Total Score  54      Functional Gait  Assessment   Gait assessed   Yes    Gait Level Surface  Walks 20 ft in less than 5.5 sec, no assistive devices, good speed, no evidence for imbalance, normal gait pattern, deviates no more than 6 in outside of the 12 in walkway width.    Change in Gait Speed  Able to smoothly change walking speed without loss of balance or gait deviation. Deviate no more than 6 in outside of the 12 in walkway width.     Gait with Horizontal Head Turns  Performs head turns smoothly with no change in gait. Deviates no more than 6 in outside 12 in walkway width    Gait with Vertical Head Turns  Performs task with slight change in gait velocity (eg, minor disruption to smooth gait path), deviates 6 - 10 in outside 12 in walkway width or uses assistive device    Gait and Pivot Turn  Turns slowly, requires verbal cueing, or requires several small steps to catch balance following turn and stop    Step Over Obstacle  Is able to step over 2 stacked shoe boxes taped together (9 in total height) without changing gait speed. No evidence of imbalance.    Gait with Narrow Base of Support  Ambulates 4-7 steps.    Gait with Eyes Closed  Walks 20 ft, no assistive devices, good speed, no evidence of imbalance, normal gait pattern, deviates no more than 6 in outside 12 in walkway width. Ambulates 20 ft in less than 7 sec.    Ambulating Backwards  Walks 20 ft, uses assistive device, slower speed, mild gait deviations, deviates 6-10 in outside 12 in walkway width.    Steps  Alternating feet, must use rail.    Total Score  23    FGA comment:  23/30 indicating medium fall risk                   [facility] Adult PT Treatment/Exercise - 10/07/18 0933      Transfers   Transfers  Sit to Stand;Stand to Sit    Sit to Stand  6: Modified independent (Device/Increase time)    Stand to Sit  6: Modified independent (Device/Increase time)      Ambulation/Gait   Ambulation/Gait  Yes    Ambulation/Gait Assistance  5: Supervision    Ambulation/Gait Assistance Details  Pt ambulates 500' outdoors with no AD navigating uneven terrain, curbs, inclines, declines, and demonstrates the ability to vary gait speed when cued. Therapist provided supervision for safety consideration. Pt performs 2nd ambulation trial indoors on level surfaces with no AD with therapist assessing gait speed.     Ambulation Distance (Feet)  500 Feet   40 feet x 2 trials  on level surface for gait speed    Assistive device  None    Gait Pattern  Step-through pattern;Decreased arm swing - right;Decreased arm swing - left;Decreased trunk rotation;Poor foot clearance - right   poor foot  clearance with increased gait speed    Ambulation Surface  Level;Unlevel;Paved;Outdoor;Indoor;Gravel    Gait velocity  4.33 ft/sec with no AD indicative of normal walking speed; 5.8 ft/sec during 2nd trial walking at fast speed with no AD    Stairs  Yes    Stairs Assistance  5: Supervision    Stair Management Technique  No rails;Alternating pattern;One rail Left    Number of Stairs  4   x2 trials    Height of Stairs  6    Curb  5: Supervision      Dynamic Standing Balance   Dynamic Standing - Comments  Pt performs dynamic standing balance on bosu ball with intermittant UE assist as needed and therapist providing CGA. Pt performs 1x30 seconds with feet apart; 1x30 seconds with horizontal head turns; 1x30 seconds vertical head turns             PT Education - 10/07/18 0939    Education Details  Therapist provides education regarding clinical findings of STG re assessment, and POC moving forward with potential to decrease frequency of visits to 1x/wk and to potentially place patient on hold s/p surgery on 11/8.     Person(s) Educated  Patient    Methods  Explanation    Comprehension  Verbalized understanding       PT Short Term Goals - 10/07/18 0941      PT SHORT TERM GOAL #1   Title  Pt will demonstrate independence and compliancy with HEP to further progress towards improving strength and functional mobility     Baseline  Dependent     Time  4    Period  Weeks    Status  Achieved      PT SHORT TERM GOAL #2   Title  Pt will improve Berg Balance Assessment by 3 points indicating minimal detectable change and decreased fall risk    Baseline  10/17: 54/56     Time  4    Period  Weeks    Status  Achieved      PT SHORT TERM GOAL #3   Title  Pt will improve gait  speed to 4.08 ft/sec using LRAD and supervision to improve functional mobility     Baseline  10/17: 4.33 ft/sec indicative of normal walking speed     Time  4    Period  Weeks    Status  Achieved      PT SHORT TERM GOAL #4   Title  Pt will improve FGA score by 4 points to increase dynamic standing during functional gait using no AD    Baseline  10/17: 23/30     Time  4    Period  Weeks    Status  Achieved      PT SHORT TERM GOAL #5   Title  Pt will ambulate 300 feet with LRAD ambulating over uneven surfaces, curbs, ramps, and demonstrate ability to vary gait speed to improve community accessibility safely     Baseline  10/17: Patient achieves goal with therapist providing supervision for safety considerations    Time  4    Period  Weeks    Status  Achieved        PT Long Term Goals - 10/07/18 0943      PT LONG TERM GOAL #1   Title  Pt will demonstrate independence with HEP to further progress towards increasing strength and functional mobility     Time  8    Period  Weeks    Status  New    Target Date  11/06/18      PT LONG TERM GOAL #2   Title  Pt will improve Berg Balance Score to >55/56 to decrease fall risk potential     Baseline  54/56    Time  8    Period  Weeks    Status  Revised    Target Date  11/06/18      PT LONG TERM GOAL #3   Title  Pt will improve FGA score to >27/30 to improve safety and balance during functional mobility with no AD and supervision     Baseline  23/30    Time  8    Period  Weeks    Status  Revised    Target Date  11/06/18      PT LONG TERM GOAL #4   Title  Pt will ambulate outdoors over uneven surfaces, curbs, ramps, inclines, and demonstrate ability to vary gait speed with LRAD and Mod I to improve safety with community accessibility     Time  8    Period  Weeks    Status  New    Target Date  11/06/18      PT LONG TERM GOAL #5   Title  Pt will negotiate 12 steps using no HR's and recirprocal stepping technique, mod I to increase  safety and independence with community accessibility indicating improvement in LE strength    Time  8    Period  Weeks    Status  Revised    Target Date  11/06/18      PT LONG TERM GOAL #6   Title  Pt will improve gait speed to >/= to 4.37 ft/sec indicating normal walking speed with Mod I and LRAD to increase independence with functional mobility     Baseline  4.33 ft/sec at 4 weeks- goal met    Time  --    Period  --    Status  Deferred            Plan - 10/07/18 0946    Clinical Impression Statement  Today's skilled session focused on reassessment of patient's short term goals and dynamic standing balance on bosu ball for increased challenge. Overall, patient has met 5 out of 5 STG's demonstrating improvements gait speed, balance, dynamic gait, and community ambulation. Pt continues to demonstrate increased difficulty with higher level balance and LE weakness. Patient will continue to benefit from skilled PT to address functional mobility deficits.     Rehab Potential  Good    Clinical Impairments Affecting Rehab Potential  R sided weakness with difficulty negotiating obstacles and performing high level balance    PT Frequency  2x / week    PT Duration  8 weeks    PT Treatment/Interventions  ADLs/Self Care Home Management;Gait training;Neuromuscular re-education;Functional mobility training;Stair training;Cognitive remediation;Energy conservation;Patient/family education;Therapeutic activities;Therapeutic exercise;Orthotic Fit/Training;Balance training;DME Instruction    PT Next Visit Plan   speak with OT & SLP about POC s/p surgery 11/8; may reduce visits to 1x/wk; tall kneeling exercises, core stabilization, high level balance, rocker board with cone tapping, foam with pertubations, bosu ball for hip strategy, ramp with compliant surface, step ups, eccentric quads, glut med strengthening, STS with L LE staggered, bridging, obstacle course/ compliant surface with items under, community  ambulation, hurdles, SLS with cone tapping all around,     PT Home Exercise Plan  [id number] & [id number]    Consulted and Agree with Plan  of Care  Patient       Patient will benefit from skilled therapeutic intervention in order to improve the following deficits and impairments:  Abnormal gait, Decreased cognition, Decreased knowledge of use of DME, Improper body mechanics, Decreased mobility, Postural dysfunction, Decreased strength, Impaired perceived functional ability, Impaired UE functional use, Decreased balance, Decreased safety awareness, Difficulty walking  Visit Diagnosis: Abnormal posture  Other abnormalities of gait and mobility  Muscle weakness (generalized)     Problem List Patient Active Problem List   Diagnosis Date Noted  . Other abnormalities of gait and mobility 09/17/2018  . Acute cerebral infarction ([facility])   . Thrombocytopenia ([facility])   . 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 Acute blood loss anemia   . Generalized OA   . Gastroesophageal reflux disease   . Seizure prophylaxis   . Leukocytosis   . Tumor 08/18/2018  . Meningioma determined by biopsy of brain ([facility]) 08/18/2018  . MACULAR DEGENERATION 02/29/2008  . ALLERGY 02/29/2008  . Nonspecific (abnormal) findings on radiological and other examination of body structure 12/26/2007  . NONSPECIFIC ABNORM FIND RAD&OTH EXAM LUNG FIELD 12/26/2007  . GALLSTONES 10/01/2007  . ELEVATION, TRANSAMINASE/LDH LEVELS 09/06/2007  . HEPATITIS NOS 09/01/2007  . ABDOMINAL PAIN, EPIGASTRIC 09/01/2007    Floreen Comber, SPT 10/07/2018, 9:51 AM  [facility] [address] [place], [place], [address] Phone: [phone]   Fax:  [phone]  Name: James Olsen MRN: [id number] Date of Birth: 07/04/1946

## 2018-10-07 NOTE — Patient Instructions (Signed)
  Please complete the assigned speech therapy homework prior to your next session and return it to the speech therapist at your next visit.  

## 2018-10-07 NOTE — Therapy (Signed)
[facility] [address] [place], [place], [address] Phone: [phone]   Fax:  [phone]  Occupational Therapy Treatment  Patient Details  Name: James Olsen MRN: [id number] Date of Birth: [birth year] No data recorded  Encounter Date: 10/07/2018  OT End of Session - 10/07/18 1012    Visit Number  9    Number of Visits  25    Authorization Type  cert. 09/07/18-12/06/18    Authorization Time Period  Medicare & [employer], covered 100%    Authorization - Visit Number  9    Authorization - Number of Visits  10    OT Start Time  [id number]    OT Stop Time  1015    OT Time Calculation (min)  39 min    Activity Tolerance  Patient tolerated treatment well    Behavior During Therapy  WFL for tasks assessed/performed       Past Medical History:  Diagnosis Date  . Arthritis   . GERD (gastroesophageal reflux disease)   . Glaucoma   . Macular degeneration   . Wears glasses     Past Surgical History:  Procedure Laterality Date  . CHOLECYSTECTOMY  2008   lap choli  . COLONOSCOPY    . CRANIOTOMY N/A 08/18/2018   Procedure: CRANIOTOMY TUMOR EXCISION;  Surgeon: [name], MD;  Location: [facility];  Service: Neurosurgery;  Laterality: N/A;  CRANIOTOMY TUMOR EXCISION  . NAILBED REPAIR Left 05/04/2014   Procedure: LEFT INDEX NAIL ABLATION V-Y FLAP COVERAGE;  Surgeon: [name]., MD;  Location: [facility];  Service: Orthopedics;  Laterality: Left;  index  . PILONIDAL CYST EXCISION     x2  . TONSILLECTOMY    . VASECTOMY      There were no vitals filed for this visit.  Subjective Assessment - 10/07/18 1011    Pertinent History  Meningioma s/p craniotomy for tumor resection 08/18/18, Acute cerebral infarction; arthritis, macular degeneration, hepatitis, GERD    Patient Stated Goals  be back to normal or 95%    Currently in Pain?  No/denies              Treatment supine closed chain shoulder  flexion and chest press followed by review of  ER strech issued last visit, with therapist perfroming gentle stretch, pt demonstrates improved ROM afterwards. Pt  performed seated closed chain shoulder flexion with therapist providing gentle reisstance throughout ROM Mid range reach with RUE to retrieve items from shelf, min v.c Arm bike x 5 mins for reciprocal movement/ conditioning Placing grooved pegs into pegboard with RUE for increased fine motor coordination, then removing with in hand manipulation, mod difficulty/ v.c and increased time required.               OT Short Term Goals - 09/28/18 0753      OT SHORT TERM GOAL #1   Title  Pt will be independent with initial HEP.--check STG 10/22/18    Time  6    Period  Weeks    Status  Achieved      OT SHORT TERM GOAL #2   Title  Pt will be able to bathe mod I.    Time  6    Period  Weeks    Status  Achieved      OT SHORT TERM GOAL #3   Title  Pt will be able to complete clothing fasteners mod I.    Time  6  Period  Weeks    Status  Achieved   09/28/18:  incr time     OT SHORT TERM GOAL #4   Title  Pt will be able to cut food mod I.    Time  6    Period  Weeks    Status  Achieved   09/28/18:  able to perform with fork/knife but no chopping/peeling     OT SHORT TERM GOAL #5   Title  Pt will demo at least 60* R shoulder flex with only min compensation for improved functional reach.    Baseline  45* with mod compensation    Period  Weeks    Status  Achieved   09/28/18:  WNL     OT SHORT TERM GOAL #6   Title  Pt will be able to use RUE as stabilizer consistently for ADLs.    Time  6    Period  Weeks    Status  Achieved        OT Long Term Goals - 09/28/18 0757      OT LONG TERM GOAL #1   Title  Pt will be independent with updated HEP.--check LTGs 12/06/18    Time  12    Period  Weeks    Status  On-going      OT LONG TERM GOAL #2   Title  Pt will perform simple cooking tasks mod I.--09/28/18 upgraded to  mod complex cooking involving carrying pots/pans and chopping/peeling with RUE    Time  12    Period  Weeks    Status  Revised   09/28/18:  met initial goal and upgraded     OT LONG TERM GOAL #3   Title  Pt will perform mod complex home maintenance tasks mod I.    Time  12    Period  Weeks    Status  On-going      OT LONG TERM GOAL #4   Title  Pt will use RUE as nondominant assist at least 75% of the time for ADLs.---upgraded 09/28/18:  will use RUE 100% of the time for previous ADL/IADL tasks.    Time  8    Period  Weeks    Status  Revised   09/28/18:  met initial goal and upgraded     OT LONG TERM GOAL #5   Title  Pt will demo at least 90* R shoulder flex for functional reach with min compensation.  09/28/18:  Pt will be able to retrieve 2-3lb object from overhead shelf with good control x3.      Period  Weeks    Status  Revised   09/28/18:  initial goal met and upgraded     OT LONG TERM GOAL #6   Title  Pt will be able to carry light bag of groceries with RUE.--09/28/18  upgrade to 5lb bag of groceries    Time  12    Period  Weeks    Status  Revised   09/28/18:  met initial goal and upgraded     OT LONG TERM GOAL #7   Title  Pt will demo improved coordination/RUE functional reach as shown by scoring at least 30 on box and blocks test.--09/28/18:  upgrade to 40 blocks    Time  12    Period  Weeks    Status  Revised   09/28/18:  30 blocks     OT LONG TERM GOAL #8   Title  Pt will demo at least  35lbs R grip strength for opening containers/carrying objects.--09/28/18 upgrade to 45lbs     Baseline  22lbs    Time  12    Period  Weeks    Status  Revised   09/28/18:  34lbs     OT LONG TERM GOAL  #9   Baseline  Pt will improve coordination with R dominant UE as shown by completing 9-hole peg test in less than 60sec.    Time  12    Period  Weeks    Status  New   Goal added 09/28/18 (baseline 102.28sec):           Plan - 10/07/18 1012    Clinical Impression Statement  Pt is  progressing towards goals. Pt is having surgery in November. continue to work towards unmet goals.    Occupational Profile and client history currently impacting functional performance  Pt was very independent and active prior to surgery.  Pt drove, performed yardwork, most cooking/home maintenance tasks, and enjoyed outdoor leisure activities.  Pt is currently not able to drive, perform yardwork, perform previous cooking/home maintenance tasks or leisure tasks.  Pt's goal is to return to 90-100% of "normal"    Rehab Potential  Good    OT Frequency  2x / week    OT Duration  12 weeks    OT Treatment/Interventions  Self-care/ADL training;Cryotherapy;Paraffin;Therapeutic exercise;DME and/or AE instruction;Functional Mobility Training;Cognitive remediation/compensation;Balance training;Splinting;Manual Therapy;Neuromuscular education;Fluidtherapy;Ultrasound;Electrical Stimulation;Aquatic Therapy;Moist Heat;Energy conservation;Passive range of motion;Therapeutic activities;Patient/family education    Plan  neuro re-ed RUE, RUE functional use, wt. bearing, coordination (tossing/catching ball with BUEs).    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 Other abnormalities of gait and mobility 09/17/2018  . Acute cerebral infarction ([place])   .  Thrombocytopenia ([place])   . 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 Acute blood loss anemia   . Generalized OA   . Gastroesophageal reflux disease   . Seizure prophylaxis   . Leukocytosis   . Tumor 08/18/2018  . Meningioma determined by biopsy of brain ([facility]) 08/18/2018  . MACULAR DEGENERATION 02/29/2008  . ALLERGY 02/29/2008  . Nonspecific (abnormal) findings on radiological and other examination of body structure 12/26/2007  . NONSPECIFIC ABNORM FIND RAD&OTH EXAM LUNG FIELD 12/26/2007  . GALLSTONES 10/01/2007  . ELEVATION, TRANSAMINASE/LDH LEVELS 09/06/2007  . HEPATITIS NOS 09/01/2007  . ABDOMINAL PAIN, EPIGASTRIC 09/01/2007    , 10/07/2018, 10:14 AM  [facility] [address] [place], [place], [address] Phone: [phone]   Fax:  [phone]  Name: James Olsen MRN: [id number] Date of Birth: [birth year]

## 2018-10-07 NOTE — Therapy (Signed)
[facility] [address] [place], [place], [address] Phone: [phone]   Fax:  [phone]  Speech Language Pathology Treatment  Patient Details  Name: James Olsen MRN: [id number] Date of Birth: 02/28/1946 Referring Provider (SLP): Alger Simons MD   Encounter Date: 10/07/2018  End of Session - 10/07/18 1216    Visit Number  9    Number of Visits  17    Date for SLP Re-Evaluation  12/03/18    SLP Start Time  22    SLP Stop Time   1101    SLP Time Calculation (min)  42 min    Activity Tolerance  Patient tolerated treatment well       Past Medical History:  Diagnosis Date  . Arthritis   . GERD (gastroesophageal reflux disease)   . Glaucoma   . Macular degeneration   . Wears glasses     Past Surgical History:  Procedure Laterality Date  . CHOLECYSTECTOMY  2008   lap choli  . COLONOSCOPY    . CRANIOTOMY N/A 08/18/2018   Procedure: CRANIOTOMY TUMOR EXCISION;  Surgeon: [name], MD;  Location: [facility];  Service: Neurosurgery;  Laterality: N/A;  CRANIOTOMY TUMOR EXCISION  . NAILBED REPAIR Left 05/04/2014   Procedure: LEFT INDEX NAIL ABLATION V-Y FLAP COVERAGE;  Surgeon: [name]., MD;  Location: [facility];  Service: Orthopedics;  Laterality: Left;  index  . PILONIDAL CYST EXCISION     x2  . TONSILLECTOMY    . VASECTOMY      There were no vitals filed for this visit.  Subjective Assessment - 10/07/18 1027    Subjective  "I have a stack of it." (homework)    Currently in Pain?  No/denies            ADULT SLP TREATMENT - 10/07/18 1047      General Information   Behavior/Cognition  Alert;Cooperative;Pleasant mood      Treatment Provided   Treatment provided  Cognitive-Linquistic      Cognitive-Linquistic Treatment   Treatment focused on  Cognition    Skilled Treatment  SLP worked with pt's organiziation skills as well as attention to detail with rechecking  of his homework. Overall pt 97% accurate. SLP targeted functional math problems today for organization and attention to detail. Pt req'd consistent extra time to arrive at correct answer. Occasional min cues were needed to incr pt's success after first attempt at answering. Pt did not spontaneously use written compensations for incr'd organizational skills due to SLP assitance primarily with written cues for organization were helpful. Therefore, SLP encouraged pt to do so, but pt cont'd to rely upon NOT using compensations. Noted that in simple to mod complex conversation pt's language was functional with use of circumlocution and synonym strategies.      Assessment / Recommendations / Plan   Plan  Continue with current plan of care      Progression Toward Goals   Progression toward goals  Progressing toward goals       SLP Education - 10/07/18 1215    Education Details  compensations for organization/attention/cognition    Person(s) Educated  Patient    Methods  Explanation    Comprehension  Verbalized understanding       SLP Short Term Goals - 10/07/18 1217      SLP SHORT TERM GOAL #1   Title  pt will provide sentence responses 18/20 with [facility] speech fluidity over three sessions  Status  Achieved      SLP SHORT TERM GOAL #2   Title  pt will undergo formal cognitive linguistic assessment by visit number 3    Status  Achieved      SLP SHORT TERM GOAL #3   Title  using compensatory strategies pt will functionally participate in 10 minutes simple conversation over 3 sessions    Time  1    Status  Achieved       SLP Long Term Goals - 10/07/18 1217      SLP LONG TERM GOAL #1   Title  using compensatory strategies pt will functionally participate in 10 minutes mod complex conversation over 3 sessions    Time  4    Period  Weeks    Status  On-going      SLP LONG TERM GOAL #2   Title  pt will functionally perform complex naming tasks with use of compensations over 2 sessions     Baseline  09/09/18    Time  4    Period  Weeks    Status  On-going      SLP LONG TERM GOAL #3   Title  pt will demo WNL emergent awareness with mod complex tasks in order to acheive 100% success over three sessions    Time  4    Period  Weeks    Status  On-going       Plan - 10/07/18 1216    Clinical Impression Statement  Pt's language remains mildly impaired, though he used compensations in conversation today functionally throughout the session. Pt cont'd to demo high level attention and awareness impairments today in simple but detailed written tasks. Continue skilled ST to maximize communication and higher level cognition for independence and QOL.     Speech Therapy Frequency  2x / week    Treatment/Interventions  SLP instruction and feedback;Compensatory strategies;Functional tasks;Cognitive reorganization;Internal/external aids;Patient/family education;Multimodal communcation approach;Environmental controls;Language facilitation;Cueing hierarchy       Patient will benefit from skilled therapeutic intervention in order to improve the following deficits and impairments:   Cognitive communication deficit  Aphasia    Problem List Patient Active Problem List   Diagnosis Date Noted  . Other abnormalities of gait and mobility 09/17/2018  . Acute cerebral infarction ([facility])   . Thrombocytopenia ([facility])   . 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 Acute blood loss anemia   . Generalized OA   . Gastroesophageal reflux disease   . Seizure prophylaxis   . Leukocytosis   . Tumor 08/18/2018  . 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 MACULAR DEGENERATION 02/29/2008  . ALLERGY 02/29/2008  . Nonspecific (abnormal) findings on radiological and other examination of body structure 12/26/2007  . NONSPECIFIC ABNORM FIND RAD&OTH EXAM LUNG FIELD 12/26/2007  . GALLSTONES 10/01/2007  . ELEVATION, TRANSAMINASE/LDH LEVELS 09/06/2007  .  HEPATITIS NOS 09/01/2007  . ABDOMINAL PAIN, EPIGASTRIC 09/01/2007    Kiwana Deblasi ,[place], CCC-SLP  10/07/2018, 12:18 PM  [facility] [address] [place], [place], [address] Phone: [phone]   Fax:  [phone]   Name: STEED KANAAN MRN: [id number] Date of Birth: [birth year]

## 2018-10-08 ENCOUNTER — Ambulatory Visit: Payer: Medicare Other

## 2018-10-08 ENCOUNTER — Ambulatory Visit: Payer: Medicare Other | Admitting: Occupational Therapy

## 2018-10-08 ENCOUNTER — Ambulatory Visit: Payer: Medicare Other | Admitting: Physical Therapy

## 2018-10-08 DIAGNOSIS — R2689 Other abnormalities of gait and mobility: Secondary | ICD-10-CM

## 2018-10-08 DIAGNOSIS — R2681 Unsteadiness on feet: Secondary | ICD-10-CM

## 2018-10-08 DIAGNOSIS — M6281 Muscle weakness (generalized): Secondary | ICD-10-CM

## 2018-10-08 DIAGNOSIS — R293 Abnormal posture: Secondary | ICD-10-CM

## 2018-10-08 DIAGNOSIS — R41841 Cognitive communication deficit: Secondary | ICD-10-CM

## 2018-10-08 DIAGNOSIS — I69353 Hemiplegia and hemiparesis following cerebral infarction affecting right non-dominant side: Secondary | ICD-10-CM

## 2018-10-08 DIAGNOSIS — R4701 Aphasia: Secondary | ICD-10-CM

## 2018-10-08 DIAGNOSIS — R278 Other lack of coordination: Secondary | ICD-10-CM

## 2018-10-08 NOTE — Therapy (Signed)
[facility] [address] [place], [place], [address] Phone: [phone]   Fax:  [phone]  Speech Language Pathology Treatment  Patient Details  Name: James Olsen MRN: [id number] Date of Birth: Aug 22, 1946 Referring Provider (SLP): Alger Simons MD   Encounter Date: 10/08/2018  End of Session - 10/08/18 1354    Visit Number  10    Number of Visits  17    Date for SLP Re-Evaluation  12/03/18    SLP Start Time  29    SLP Stop Time   1100    SLP Time Calculation (min)  40 min    Activity Tolerance  Patient tolerated treatment well       Past Medical History:  Diagnosis Date  . Arthritis   . GERD (gastroesophageal reflux disease)   . Glaucoma   . Macular degeneration   . Wears glasses     Past Surgical History:  Procedure Laterality Date  . CHOLECYSTECTOMY  2008   lap choli  . COLONOSCOPY    . CRANIOTOMY N/A 08/18/2018   Procedure: CRANIOTOMY TUMOR EXCISION;  Surgeon: [name], MD;  Location: [facility];  Service: Neurosurgery;  Laterality: N/A;  CRANIOTOMY TUMOR EXCISION  . NAILBED REPAIR Left 05/04/2014   Procedure: LEFT INDEX NAIL ABLATION V-Y FLAP COVERAGE;  Surgeon: [name]., MD;  Location: [facility];  Service: Orthopedics;  Laterality: Left;  index  . PILONIDAL CYST EXCISION     x2  . TONSILLECTOMY    . VASECTOMY      There were no vitals filed for this visit.  Subjective Assessment - 10/08/18 1351    Subjective  PT got homework out for SLP. "I hope this is right."    Currently in Pain?  No/denies            ADULT SLP TREATMENT - 10/08/18 1352      General Information   Behavior/Cognition  Alert;Cooperative;Pleasant mood      Treatment Provided   Treatment provided  Cognitive-Linquistic      Cognitive-Linquistic Treatment   Treatment focused on  Cognition    Skilled Treatment  Pt reports performing math computations instead of trying to do math  in his head, which pt states was more accurate for him. Pt sequenced 8-step cards with extra time consistently, but 100% success. Alternating attention req'd min extra time for refocus on return to interrupted task.       Assessment / Recommendations / Plan   Plan  Continue with current plan of care      Progression Toward Goals   Progression toward goals  Progressing toward goals         SLP Short Term Goals - 10/07/18 1217      SLP SHORT TERM GOAL #1   Title  pt will provide sentence responses 18/20 with WFL speech fluidity over three sessions    Status  Achieved      SLP SHORT TERM GOAL #2   Title  pt will undergo formal cognitive linguistic assessment by visit number 3    Status  Achieved      SLP SHORT TERM GOAL #3   Title  using compensatory strategies pt will functionally participate in 10 minutes simple conversation over 3 sessions    Time  1    Status  Achieved       SLP Long Term Goals - 10/08/18 1356      SLP LONG TERM GOAL #1  Title  using compensatory strategies pt will functionally participate in 10 minutes mod complex conversation over 3 sessions    Time  4    Period  Weeks    Status  On-going      SLP LONG TERM GOAL #2   Title  pt will functionally perform complex naming tasks with use of compensations over 2 sessions    Baseline  09/09/18    Time  4    Period  Weeks    Status  On-going      SLP LONG TERM GOAL #3   Title  pt will demo WNL emergent awareness with mod complex tasks in order to acheive 100% success over three sessions    Time  4    Period  Weeks    Status  On-going       Plan - 10/08/18 1355    Clinical Impression Statement  Pt's language remains mildly impaired, though he used compensations in conversation today functionally throughout the session. Pt cont'd to demo high level attention and awareness impairments today in simple but detailed written tasks, extra time allowed consistently. Pt used compensatory strategies when necessary  today (written cues) to assist with attention. Continue skilled ST to maximize communication and higher level cognition for independence and QOL.     Speech Therapy Frequency  2x / week    Treatment/Interventions  SLP instruction and feedback;Compensatory strategies;Functional tasks;Cognitive reorganization;Internal/external aids;Patient/family education;Multimodal communcation approach;Environmental controls;Language facilitation;Cueing hierarchy       Patient will benefit from skilled therapeutic intervention in order to improve the following deficits and impairments:   Cognitive communication deficit  Aphasia    Problem List Patient Active Problem List   Diagnosis Date Noted  . Other abnormalities of gait and mobility 09/17/2018  . Acute cerebral infarction ([facility])   . Thrombocytopenia ([facility])   . 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 Acute blood loss anemia   . Generalized OA   . Gastroesophageal reflux disease   . Seizure prophylaxis   . Leukocytosis   . Tumor 08/18/2018  . Meningioma determined by biopsy of brain ([facility]) 08/18/2018  . MACULAR DEGENERATION 02/29/2008  . ALLERGY 02/29/2008  . Nonspecific (abnormal) findings on radiological and other examination of body structure 12/26/2007  . NONSPECIFIC ABNORM FIND RAD&OTH EXAM LUNG FIELD 12/26/2007  . GALLSTONES 10/01/2007  . ELEVATION, TRANSAMINASE/LDH LEVELS 09/06/2007  . HEPATITIS NOS 09/01/2007  . ABDOMINAL PAIN, EPIGASTRIC 09/01/2007    Deborah Dondero ,MS, CCC-SLP  10/08/2018, 1:56 PM  [facility] [address] [place], [place], [address] Phone: [phone]   Fax:  [phone]   Name: James Olsen MRN: [id number] Date of Birth: 06-Apr-1946

## 2018-10-08 NOTE — Therapy (Signed)
[facility] [address] [place], [place], [address] Phone: [phone]   Fax:  [phone]  Occupational Therapy Treatment  Patient Details  Name: James Olsen MRN: [id number] Date of Birth: 04-24-1946 No data recorded  Encounter Date: 10/08/2018  OT End of Session - 10/08/18 1220    Visit Number  10    Number of Visits  25    Date for OT Re-Evaluation  12/06/18    Authorization Type  cert. 09/07/18-12/06/18    Authorization Time Period  Medicare & [employer], covered 100%    Authorization - Visit Number  10    Authorization - Number of Visits  10    OT Start Time  4650    OT Stop Time  1230    OT Time Calculation (min)  42 min    Activity Tolerance  Patient tolerated treatment well    Behavior During Therapy  WFL for tasks assessed/performed       Past Medical History:  Diagnosis Date  . Arthritis   . GERD (gastroesophageal reflux disease)   . Glaucoma   . Macular degeneration   . Wears glasses     Past Surgical History:  Procedure Laterality Date  . CHOLECYSTECTOMY  2008   lap choli  . COLONOSCOPY    . CRANIOTOMY N/A 08/18/2018   Procedure: CRANIOTOMY TUMOR EXCISION;  Surgeon: [name], MD;  Location: [facility];  Service: Neurosurgery;  Laterality: N/A;  CRANIOTOMY TUMOR EXCISION  . NAILBED REPAIR Left 05/04/2014   Procedure: LEFT INDEX NAIL ABLATION V-Y FLAP COVERAGE;  Surgeon: [name]., MD;  Location: [facility];  Service: Orthopedics;  Laterality: Left;  index  . PILONIDAL CYST EXCISION     x2  . TONSILLECTOMY    . VASECTOMY      There were no vitals filed for this visit.  Subjective Assessment - 10/08/18 1147    Pertinent History  Meningioma s/p craniotomy for tumor resection 08/18/18, Acute cerebral infarction; arthritis, macular degeneration, hepatitis, GERD    Patient Stated Goals  be back to normal or 95%    Currently in Pain?  No/denies                 Treatment: quadraped for cat/ cow positions , then rocking forwards and backwards, followed by quadraped over ball lifting alternate UE/LE, and then tall kneeling while rolling ball forwards and backwards, all for UE strength and trunk control, min facilitation/ v.c. Wall pushups x 10 reps, min v.c then rolling medium ball up wall with bilateral UE's, mod difficulty/ min facilitation  Tossing ball in air with bilateral UE's min-mod difficulty, min v.c Copying small peg design with RUE, min difficulty/ v.c then removing with in hand manipulation, mod difficulty.               OT Short Term Goals - 09/28/18 0753      OT SHORT TERM GOAL #1   Title  Pt will be independent with initial HEP.--check STG 10/22/18    Time  6    Period  Weeks    Status  Achieved      OT SHORT TERM GOAL #2   Title  Pt will be able to bathe mod I.    Time  6    Period  Weeks    Status  Achieved      OT SHORT TERM GOAL #3   Title  Pt will be able to complete clothing fasteners  mod I.    Time  6    Period  Weeks    Status  Achieved   09/28/18:  incr time     OT SHORT TERM GOAL #4   Title  Pt will be able to cut food mod I.    Time  6    Period  Weeks    Status  Achieved   09/28/18:  able to perform with fork/knife but no chopping/peeling     OT SHORT TERM GOAL #5   Title  Pt will demo at least 60* R shoulder flex with only min compensation for improved functional reach.    Baseline  45* with mod compensation    Period  Weeks    Status  Achieved   09/28/18:  WNL     OT SHORT TERM GOAL #6   Title  Pt will be able to use RUE as stabilizer consistently for ADLs.    Time  6    Period  Weeks    Status  Achieved        OT Long Term Goals - 09/28/18 0757      OT LONG TERM GOAL #1   Title  Pt will be independent with updated HEP.--check LTGs 12/06/18    Time  12    Period  Weeks    Status  On-going      OT LONG TERM GOAL #2   Title  Pt will perform simple cooking  tasks mod I.--09/28/18 upgraded to mod complex cooking involving carrying pots/pans and chopping/peeling with RUE    Time  12    Period  Weeks    Status  Revised   09/28/18:  met initial goal and upgraded     OT LONG TERM GOAL #3   Title  Pt will perform mod complex home maintenance tasks mod I.    Time  12    Period  Weeks    Status  On-going      OT LONG TERM GOAL #4   Title  Pt will use RUE as nondominant assist at least 75% of the time for ADLs.---upgraded 09/28/18:  will use RUE 100% of the time for previous ADL/IADL tasks.    Time  8    Period  Weeks    Status  Revised   09/28/18:  met initial goal and upgraded     OT LONG TERM GOAL #5   Title  Pt will demo at least 90* R shoulder flex for functional reach with min compensation.  09/28/18:  Pt will be able to retrieve 2-3lb object from overhead shelf with good control x3.      Period  Weeks    Status  Revised   09/28/18:  initial goal met and upgraded     OT LONG TERM GOAL #6   Title  Pt will be able to carry light bag of groceries with RUE.--09/28/18  upgrade to 5lb bag of groceries    Time  12    Period  Weeks    Status  Revised   09/28/18:  met initial goal and upgraded     OT LONG TERM GOAL #7   Title  Pt will demo improved coordination/RUE functional reach as shown by scoring at least 30 on box and blocks test.--09/28/18:  upgrade to 40 blocks    Time  12    Period  Weeks    Status  Revised   09/28/18:  30 blocks     OT LONG TERM  GOAL #8   Title  Pt will demo at least 35lbs R grip strength for opening containers/carrying objects.--09/28/18 upgrade to 45lbs     Baseline  22lbs    Time  12    Period  Weeks    Status  Revised   09/28/18:  34lbs     OT LONG TERM GOAL  #9   Baseline  Pt will improve coordination with R dominant UE as shown by completing 9-hole peg test in less than 60sec.    Time  12    Period  Weeks    Status  New   Goal added 09/28/18 (baseline 102.28sec):           Plan - 10/08/18 1221     Clinical Impression Statement  Pt is progressing towards goals. Pt has met all short term goals and several of his long term goals. Reporting period 09/07/18-10/18. Pt can benefit from continued skilles occupational therapy to address RUE strength, coordination, balance in order to maximize pt's safety and I with ADLS/IADLS.    Occupational Profile and client history currently impacting functional performance  Pt was very independent and active prior to surgery.  Pt drove, performed yardwork, most cooking/home maintenance tasks, and enjoyed outdoor leisure activities.  Pt is currently not able to drive, perform yardwork, perform previous cooking/home maintenance tasks or leisure tasks.  Pt's goal is to return to 90-100% of "normal"    Occupational performance deficits (Please refer to evaluation for details):  ADL's;IADL's;Social Participation;Leisure    Rehab Potential  Good    OT Frequency  2x / week    OT Duration  12 weeks    OT Treatment/Interventions  Self-care/ADL training;Cryotherapy;Paraffin;Therapeutic exercise;DME and/or AE instruction;Functional Mobility Training;Cognitive remediation/compensation;Balance training;Splinting;Manual Therapy;Neuromuscular education;Fluidtherapy;Ultrasound;Electrical Stimulation;Aquatic Therapy;Moist Heat;Energy conservation;Passive range of motion;Therapeutic activities;Patient/family education    Plan  neuro re-ed RUE, RUE functional use, wt. bearing, coordination, consider decreasing to 1x week until surgery.    Consulted and Agree with Plan of Care  Patient       Patient will benefit from skilled therapeutic intervention in order to improve the following deficits and impairments:  Abnormal gait, Decreased balance, Decreased endurance, Decreased mobility, Decreased range of motion, Decreased cognition, Decreased activity tolerance, Decreased coordination, Decreased knowledge of use of DME, Decreased strength, Impaired flexibility, Impaired UE functional  use  Visit Diagnosis: Muscle weakness (generalized)  Abnormal posture  Other lack of coordination  Hemiplegia and hemiparesis following cerebral infarction affecting right non-dominant side (HCC)  Unsteadiness on feet    Problem List Patient Active Problem List   Diagnosis Date Noted  . Other abnormalities of gait and mobility 09/17/2018  . Acute cerebral infarction ([facility])   . Thrombocytopenia ([facility])   . 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 Acute blood loss anemia   . Generalized OA   . Gastroesophageal reflux disease   . Seizure prophylaxis   . Leukocytosis   . Tumor 08/18/2018  . 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 MACULAR DEGENERATION 02/29/2008  . ALLERGY 02/29/2008  . Nonspecific (abnormal) findings on radiological and other examination of body structure 12/26/2007  . NONSPECIFIC ABNORM FIND RAD&OTH EXAM LUNG FIELD 12/26/2007  . GALLSTONES 10/01/2007  . ELEVATION, TRANSAMINASE/LDH LEVELS 09/06/2007  . HEPATITIS NOS 09/01/2007  . ABDOMINAL PAIN, EPIGASTRIC 09/01/2007    Treina Arscott 10/08/2018, 12:27 PM Theone Murdoch, OTR/L [phone] Phone: [phone] 1:13 PM 10/18/19[facility] [address]  [place], [place], [address] Phone: [phone]   Fax:  [phone]  Name: James Olsen MRN: [id number] Date of Birth: [birth year]

## 2018-10-08 NOTE — Therapy (Signed)
[facility] [address] [facility] [place], [place], [address] Phone: [phone]   Fax:  [phone]  Physical Therapy Treatment  Patient Details  Name: James Olsen MRN: [id number] Date of Birth: 06-02-1946 Referring Provider (PT): Dr. Alger Simons   Encounter Date: 10/08/2018  PT End of Session - 10/08/18 1157    Visit Number  8    Number of Visits  16    Date for PT Re-Evaluation  11/06/18    Authorization Type  Medicare Part A&B- Medicare progress note every 10th visit    Authorization Time Period  Follow medicare guidelines     PT Start Time  1100    PT Stop Time  1145    PT Time Calculation (min)  45 min    Activity Tolerance  Patient tolerated treatment well    Behavior During Therapy  [facility] for tasks assessed/performed       Past Medical History:  Diagnosis Date  . Arthritis   . GERD (gastroesophageal reflux disease)   . Glaucoma   . Macular degeneration   . Wears glasses     Past Surgical History:  Procedure Laterality Date  . CHOLECYSTECTOMY  2008   lap choli  . COLONOSCOPY    . CRANIOTOMY N/A 08/18/2018   Procedure: CRANIOTOMY TUMOR EXCISION;  Surgeon: [name], MD;  Location: [facility];  Service: Neurosurgery;  Laterality: N/A;  CRANIOTOMY TUMOR EXCISION  . NAILBED REPAIR Left 05/04/2014   Procedure: LEFT INDEX NAIL ABLATION V-Y FLAP COVERAGE;  Surgeon: [name]., MD;  Location: [facility];  Service: Orthopedics;  Laterality: Left;  index  . PILONIDAL CYST EXCISION     x2  . TONSILLECTOMY    . VASECTOMY      There were no vitals filed for this visit.  Subjective Assessment - 10/08/18 1151    Subjective  Pt reports he is doing good, no complaints upon arrival and he feels he is getting stronger.    Currently in Pain?  No/denies                       [facility] Adult PT Treatment/Exercise - 10/08/18 0001      Ambulation/Gait   Ambulation/Gait  Yes    Ambulation/Gait Assistance  5: Supervision    Ambulation/Gait Assistance Details  600 feet outside around building, negotiating curbs, and uneven terrain with supervision and no LOB.     Ambulation Distance (Feet)  600 Feet    Assistive device  None      Dynamic Standing Balance   Dynamic Standing - Comments  Standing on Bosu in parallel bars 1 min, SL cone taps (3 cones X 5 reps bilat), stepping over hurdles fwd and lateral X 2 ea      Exercises   Other Exercises   Eccentric sit to stand 2X10, mini lunges X 10 bilat with 1 UE support, stairs up/down X 5 no UE support, lateral step ups at first stair X 10 bilat no UE support               PT Short Term Goals - 10/07/18 0941      PT SHORT TERM GOAL #1   Title  Pt will demonstrate independence and compliancy with HEP to further progress towards improving strength and functional mobility     Baseline  Dependent     Time  4    Period  Weeks    Status  Achieved      PT SHORT TERM GOAL #2   Title  Pt will improve Berg Balance Assessment by 3 points indicating minimal detectable change and decreased fall risk    Baseline  10/17: 54/56     Time  4    Period  Weeks    Status  Achieved      PT SHORT TERM GOAL #3   Title  Pt will improve gait speed to 4.08 ft/sec using LRAD and supervision to improve functional mobility     Baseline  10/17: 4.33 ft/sec indicative of normal walking speed     Time  4    Period  Weeks    Status  Achieved      PT SHORT TERM GOAL #4   Title  Pt will improve FGA score by 4 points to increase dynamic standing during functional gait using no AD    Baseline  10/17: 23/30     Time  4    Period  Weeks    Status  Achieved      PT SHORT TERM GOAL #5   Title  Pt will ambulate 300 feet with LRAD ambulating over uneven surfaces, curbs, ramps, and demonstrate ability to vary gait speed to improve community accessibility safely     Baseline  10/17: Patient achieves goal with therapist providing supervision  for safety considerations    Time  4    Period  Weeks    Status  Achieved        PT Long Term Goals - 10/07/18 0943      PT LONG TERM GOAL #1   Title  Pt will demonstrate independence with HEP to further progress towards increasing strength and functional mobility     Time  8    Period  Weeks    Status  New    Target Date  11/06/18      PT LONG TERM GOAL #2   Title  Pt will improve Berg Balance Score to >55/56 to decrease fall risk potential     Baseline  54/56    Time  8    Period  Weeks    Status  Revised    Target Date  11/06/18      PT LONG TERM GOAL #3   Title  Pt will improve FGA score to >27/30 to improve safety and balance during functional mobility with no AD and supervision     Baseline  23/30    Time  8    Period  Weeks    Status  Revised    Target Date  11/06/18      PT LONG TERM GOAL #4   Title  Pt will ambulate outdoors over uneven surfaces, curbs, ramps, inclines, and demonstrate ability to vary gait speed with LRAD and Mod I to improve safety with community accessibility     Time  8    Period  Weeks    Status  New    Target Date  11/06/18      PT LONG TERM GOAL #5   Title  Pt will negotiate 12 steps using no HR's and recirprocal stepping technique, mod I to increase safety and independence with community accessibility indicating improvement in LE strength    Time  8    Period  Weeks    Status  Revised    Target Date  11/06/18      PT LONG TERM GOAL #6   Title  Pt will improve gait  speed to >/= to 4.37 ft/sec indicating normal walking speed with Mod I and LRAD to increase independence with functional mobility     Baseline  4.33 ft/sec at 4 weeks- goal met    Time  --    Period  --    Status  Deferred            Plan - 10/08/18 1157    Clinical Impression Statement  Pt challenged today with dynamic higher level balance activities and was supervision for all activities. He was also progressed with eccentric strengthening with good overall  tolerance but does require occasional rest breaks. PT feels he can drop down to one time per weke for PT due to his progress made with strength and balance.     Rehab Potential  Good    Clinical Impairments Affecting Rehab Potential  R sided weakness with difficulty negotiating obstacles and performing high level balance    PT Frequency  2x / week    PT Duration  8 weeks    PT Treatment/Interventions  ADLs/Self Care Home Management;Gait training;Neuromuscular re-education;Functional mobility training;Stair training;Cognitive remediation;Energy conservation;Patient/family education;Therapeutic activities;Therapeutic exercise;Orthotic Fit/Training;Balance training;DME Instruction    PT Next Visit Plan   speak with OT & SLP about POC s/p surgery 11/8; may reduce visits to 1x/wk; tall kneeling exercises, core stabilization, high level balance, rocker board with cone tapping, foam with pertubations, bosu ball for hip strategy, ramp with compliant surface, step ups, eccentric quads, glut med strengthening, STS with L LE staggered, bridging, obstacle course/ compliant surface with items under, community ambulation, hurdles, SLS with cone tapping all around,     PT Home Exercise Plan  [id number] & [id number]    Consulted and Agree with Plan of Care  Patient       Patient will benefit from skilled therapeutic intervention in order to improve the following deficits and impairments:  Abnormal gait, Decreased cognition, Decreased knowledge of use of DME, Improper body mechanics, Decreased mobility, Postural dysfunction, Decreased strength, Impaired perceived functional ability, Impaired UE functional use, Decreased balance, Decreased safety awareness, Difficulty walking  Visit Diagnosis: Muscle weakness (generalized)  Abnormal posture  Other abnormalities of gait and mobility     Problem List Patient Active Problem List   Diagnosis Date Noted  . Other abnormalities of gait and mobility 09/17/2018  .  Acute cerebral infarction ([place])   . Thrombocytopenia ([place])   . 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 Acute blood loss anemia   . Generalized OA   . Gastroesophageal reflux disease   . Seizure prophylaxis   . Leukocytosis   . Tumor 08/18/2018  . 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 MACULAR DEGENERATION 02/29/2008  . ALLERGY 02/29/2008  . Nonspecific (abnormal) findings on radiological and other examination of body structure 12/26/2007  . NONSPECIFIC ABNORM FIND RAD&OTH EXAM LUNG FIELD 12/26/2007  . GALLSTONES 10/01/2007  . ELEVATION, TRANSAMINASE/LDH LEVELS 09/06/2007  . HEPATITIS NOS 09/01/2007  . ABDOMINAL PAIN, EPIGASTRIC 09/01/2007    Debbe Odea, PT, DPT 10/08/2018, 12:01 PM  [facility] [address] [place], [place], [address] Phone: [phone]   Fax:  [phone]  Name: JASPAL PULTZ MRN: [id number] Date of Birth: [birth year]

## 2018-10-08 NOTE — Patient Instructions (Signed)
  Please complete the assigned speech therapy homework prior to your next session and return it to the speech therapist at your next visit.  

## 2018-10-13 ENCOUNTER — Ambulatory Visit: Payer: Medicare Other | Admitting: Occupational Therapy

## 2018-10-13 ENCOUNTER — Ambulatory Visit: Payer: Medicare Other

## 2018-10-13 ENCOUNTER — Ambulatory Visit: Payer: Medicare Other | Admitting: Physical Therapy

## 2018-10-13 DIAGNOSIS — M6281 Muscle weakness (generalized): Secondary | ICD-10-CM | POA: Diagnosis not present

## 2018-10-13 DIAGNOSIS — I69353 Hemiplegia and hemiparesis following cerebral infarction affecting right non-dominant side: Secondary | ICD-10-CM

## 2018-10-13 DIAGNOSIS — R41841 Cognitive communication deficit: Secondary | ICD-10-CM

## 2018-10-13 DIAGNOSIS — R278 Other lack of coordination: Secondary | ICD-10-CM

## 2018-10-13 DIAGNOSIS — R4701 Aphasia: Secondary | ICD-10-CM

## 2018-10-13 NOTE — Therapy (Signed)
[facility] [address] [place], [place], [address] Phone: [phone]   Fax:  [phone]  Occupational Therapy Treatment  Patient Details  Name: James Olsen MRN: [id number] Date of Birth: 06/09/1946 No data recorded  Encounter Date: 10/13/2018  OT End of Session - 10/13/18 1139    Visit Number  11    Number of Visits  25    Date for OT Re-Evaluation  12/06/18    Authorization Type  cert. 09/07/18-12/06/18    Authorization Time Period  Medicare & [employer], covered 100%    Authorization - Visit Number  11    Authorization - Number of Visits  20    OT Start Time  6073    OT Stop Time  1145    OT Time Calculation (min)  40 min    Activity Tolerance  Patient tolerated treatment well    Behavior During Therapy  WFL for tasks assessed/performed       Past Medical History:  Diagnosis Date  . Arthritis   . GERD (gastroesophageal reflux disease)   . Glaucoma   . Macular degeneration   . Wears glasses     Past Surgical History:  Procedure Laterality Date  . CHOLECYSTECTOMY  2008   lap choli  . COLONOSCOPY    . CRANIOTOMY N/A 08/18/2018   Procedure: CRANIOTOMY TUMOR EXCISION;  Surgeon: [name], MD;  Location: [facility];  Service: Neurosurgery;  Laterality: N/A;  CRANIOTOMY TUMOR EXCISION  . NAILBED REPAIR Left 05/04/2014   Procedure: LEFT INDEX NAIL ABLATION V-Y FLAP COVERAGE;  Surgeon: [name]., MD;  Location: [facility];  Service: Orthopedics;  Laterality: Left;  index  . PILONIDAL CYST EXCISION     x2  . TONSILLECTOMY    . VASECTOMY      There were no vitals filed for this visit.  Subjective Assessment - 10/13/18 1139    Subjective   Denies pain, reports surgery Novemeber 8    Pertinent History  Meningioma s/p craniotomy for tumor resection 08/18/18, Acute cerebral infarction; arthritis, macular degeneration, hepatitis, GERD    Patient Stated Goals  be back to normal or 95%     Currently in Pain?  No/denies            Treatment:Quadraped rocking forwards and backwards, followed by quadraped over ball lifting alternate UE/LE, and then tall kneeling while rolling ball forwards and backwards, all for UE strength and trunk control, min facilitation/ v.c.  rolling medium ball up wall with bilateral UE's, mod difficulty/ min facilitation Dribbling ball, min-mod difficulty, improved timing and performance(standing in corner due to decreased balance) Placing shapes in perfection board with RUE, occasional difficulty Gripper level 1 for sustained grip 1-2 rest breaks required Arm bike x 6 mins level 6 for conditioning. Tying shoe mod I with increased time                  OT Short Term Goals - 09/28/18 0753      OT SHORT TERM GOAL #1   Title  Pt will be independent with initial HEP.--check STG 10/22/18    Time  6    Period  Weeks    Status  Achieved      OT SHORT TERM GOAL #2   Title  Pt will be able to bathe mod I.    Time  6    Period  Weeks    Status  Achieved  OT SHORT TERM GOAL #3   Title  Pt will be able to complete clothing fasteners mod I.    Time  6    Period  Weeks    Status  Achieved   09/28/18:  incr time     OT SHORT TERM GOAL #4   Title  Pt will be able to cut food mod I.    Time  6    Period  Weeks    Status  Achieved   09/28/18:  able to perform with fork/knife but no chopping/peeling     OT SHORT TERM GOAL #5   Title  Pt will demo at least 60* R shoulder flex with only min compensation for improved functional reach.    Baseline  45* with mod compensation    Period  Weeks    Status  Achieved   09/28/18:  WNL     OT SHORT TERM GOAL #6   Title  Pt will be able to use RUE as stabilizer consistently for ADLs.    Time  6    Period  Weeks    Status  Achieved        OT Long Term Goals - 09/28/18 0757      OT LONG TERM GOAL #1   Title  Pt will be independent with updated HEP.--check LTGs 12/06/18    Time  12     Period  Weeks    Status  On-going      OT LONG TERM GOAL #2   Title  Pt will perform simple cooking tasks mod I.--09/28/18 upgraded to mod complex cooking involving carrying pots/pans and chopping/peeling with RUE    Time  12    Period  Weeks    Status  Revised   09/28/18:  met initial goal and upgraded     OT LONG TERM GOAL #3   Title  Pt will perform mod complex home maintenance tasks mod I.    Time  12    Period  Weeks    Status  On-going      OT LONG TERM GOAL #4   Title  Pt will use RUE as nondominant assist at least 75% of the time for ADLs.---upgraded 09/28/18:  will use RUE 100% of the time for previous ADL/IADL tasks.    Time  8    Period  Weeks    Status  Revised   09/28/18:  met initial goal and upgraded     OT LONG TERM GOAL #5   Title  Pt will demo at least 90* R shoulder flex for functional reach with min compensation.  09/28/18:  Pt will be able to retrieve 2-3lb object from overhead shelf with good control x3.      Period  Weeks    Status  Revised   09/28/18:  initial goal met and upgraded     OT LONG TERM GOAL #6   Title  Pt will be able to carry light bag of groceries with RUE.--09/28/18  upgrade to 5lb bag of groceries    Time  12    Period  Weeks    Status  Revised   09/28/18:  met initial goal and upgraded     OT LONG TERM GOAL #7   Title  Pt will demo improved coordination/RUE functional reach as shown by scoring at least 30 on box and blocks test.--09/28/18:  upgrade to 40 blocks    Time  12    Period  Weeks  Status  Revised   09/28/18:  30 blocks     OT LONG TERM GOAL #8   Title  Pt will demo at least 35lbs R grip strength for opening containers/carrying objects.--09/28/18 upgrade to 45lbs     Baseline  22lbs    Time  12    Period  Weeks    Status  Revised   09/28/18:  34lbs     OT LONG TERM GOAL  #9   Baseline  Pt will improve coordination with R dominant UE as shown by completing 9-hole peg test in less than 60sec.    Time  12    Period  Weeks     Status  New   Goal added 09/28/18 (baseline 102.28sec):           Plan - 10/13/18 1145    Clinical Impression Statement  Pt demonstrates excellent overall progress, with improving RUE functional use, coordination and strength.    Occupational Profile and client history currently impacting functional performance  Pt was very independent and active prior to surgery.  Pt drove, performed yardwork, most cooking/home maintenance tasks, and enjoyed outdoor leisure activities.  Pt is currently not able to drive, perform yardwork, perform previous cooking/home maintenance tasks or leisure tasks.  Pt's goal is to return to 90-100% of "normal"    Occupational performance deficits (Please refer to evaluation for details):  ADL's;IADL's;Social Participation;Leisure    Rehab Potential  Good    OT Frequency  2x / week    OT Duration  12 weeks    OT Treatment/Interventions  Self-care/ADL training;Cryotherapy;Paraffin;Therapeutic exercise;DME and/or AE instruction;Functional Mobility Training;Cognitive remediation/compensation;Balance training;Splinting;Manual Therapy;Neuromuscular education;Fluidtherapy;Ultrasound;Electrical Stimulation;Aquatic Therapy;Moist Heat;Energy conservation;Passive range of motion;Therapeutic activities;Patient/family education    Plan  neuro re-ed RUE, RUE functional use, wt. bearing, coordination, pt is having surgery Nov. 8, PT may decrease frequency until then    OT Home Exercise Plan  Education provided:  edema management, proper positioning of RUE, initial HEP with ball, red putty HEP, coordination HEP    Consulted and Agree with Plan of Care  Patient       Patient will benefit from skilled therapeutic intervention in order to improve the following deficits and impairments:     Visit Diagnosis: Muscle weakness (generalized)  Other lack of coordination  Hemiplegia and hemiparesis following cerebral infarction affecting right non-dominant side [facility])    Problem  List Patient Active Problem List   Diagnosis Date Noted  . Other abnormalities of gait and mobility 09/17/2018  . Acute cerebral infarction ([place])   . Thrombocytopenia ([place])   . 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 Acute blood loss anemia   . Generalized OA   . Gastroesophageal reflux disease   . Seizure prophylaxis   . Leukocytosis   . Tumor 08/18/2018  . 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 MACULAR DEGENERATION 02/29/2008  . ALLERGY 02/29/2008  . Nonspecific (abnormal) findings on radiological and other examination of body structure 12/26/2007  . NONSPECIFIC ABNORM FIND RAD&OTH EXAM LUNG FIELD 12/26/2007  . GALLSTONES 10/01/2007  . ELEVATION, TRANSAMINASE/LDH LEVELS 09/06/2007  . HEPATITIS NOS 09/01/2007  . ABDOMINAL PAIN, EPIGASTRIC 09/01/2007    Shannara Winbush 10/13/2018, 12:03 PM  [facility] [address] [place], [place], [address] Phone: [phone]   Fax:  [phone]  Name: James Olsen MRN: [id number] Date of Birth: 03/05/1946

## 2018-10-13 NOTE — Therapy (Signed)
[facility] [address] [place], [place], [address] Phone: [phone]   Fax:  [phone]  Speech Language Pathology Treatment  Patient Details  Name: James Olsen MRN: [id number] Date of Birth: Dec 31, 1945 Referring Provider (SLP): Alger Simons MD   Encounter Date: 10/13/2018  End of Session - 10/13/18 1216    Visit Number  11    Number of Visits  17    Date for SLP Re-Evaluation  12/03/18    SLP Start Time  1018    SLP Stop Time   1100    SLP Time Calculation (min)  42 min    Activity Tolerance  Patient tolerated treatment well       Past Medical History:  Diagnosis Date  . Arthritis   . GERD (gastroesophageal reflux disease)   . Glaucoma   . Macular degeneration   . Wears glasses     Past Surgical History:  Procedure Laterality Date  . CHOLECYSTECTOMY  2008   lap choli  . COLONOSCOPY    . CRANIOTOMY N/A 08/18/2018   Procedure: CRANIOTOMY TUMOR EXCISION;  Surgeon: [name], MD;  Location: [facility];  Service: Neurosurgery;  Laterality: N/A;  CRANIOTOMY TUMOR EXCISION  . NAILBED REPAIR Left 05/04/2014   Procedure: LEFT INDEX NAIL ABLATION V-Y FLAP COVERAGE;  Surgeon: [name]., MD;  Location: [facility];  Service: Orthopedics;  Laterality: Left;  index  . PILONIDAL CYST EXCISION     x2  . TONSILLECTOMY    . VASECTOMY      There were no vitals filed for this visit.  Subjective Assessment - 10/13/18 1024    Subjective  Pt thought the homework was difficult (written directions), however pt was 95-100% correct.    Currently in Pain?  No/denies            ADULT SLP TREATMENT - 10/13/18 1041      General Information   Behavior/Cognition  Alert;Cooperative;Pleasant mood      Treatment Provided   Treatment provided  Cognitive-Linquistic      Cognitive-Linquistic Treatment   Treatment focused on  Cognition    Skilled Treatment  Pt with success on homework at  95-100%. Pt told SLP he feels vulnerable right now due to cognitive deficits. SLP assured pt re: success/accuracy with homework. SLP targeted attention to detail and organization in novel task [employer] with senior discounts) and pt req/d consistent mod cues for attention to detail, and for alternating attention (i.e., forgot discount after checking full price).       Assessment / Recommendations / Plan   Plan  Continue with current plan of care      Progression Toward Goals   Progression toward goals  Progressing toward goals         SLP Short Term Goals - 10/07/18 1217      SLP SHORT TERM GOAL #1   Title  pt will provide sentence responses 18/20 with WFL speech fluidity over three sessions    Status  Achieved      SLP SHORT TERM GOAL #2   Title  pt will undergo formal cognitive linguistic assessment by visit number 3    Status  Achieved      SLP SHORT TERM GOAL #3   Title  using compensatory strategies pt will functionally participate in 10 minutes simple conversation over 3 sessions    Time  1    Status  Achieved  SLP Long Term Goals - 10/13/18 1217      SLP LONG TERM GOAL #1   Title  using compensatory strategies pt will functionally participate in 10 minutes mod complex conversation over 3 sessions    Time  3    Period  Weeks    Status  On-going      SLP LONG TERM GOAL #2   Title  pt will functionally perform complex naming tasks with use of compensations over 2 sessions    Baseline  09/09/18    Time  3    Period  Weeks    Status  On-going      SLP LONG TERM GOAL #3   Title  pt will demo WNL emergent awareness with mod complex tasks in order to acheive 100% success over three sessions    Time  3    Period  Weeks    Status  On-going       Plan - 10/13/18 1217    Clinical Impression Statement  Pt's language remains mildly impaired, compensations in conversation today again used functionally throughout the session. Pt cont'd to demo high level  attention and awareness impairments today in simple but detailed written tasks, extra time allowed consistently. Spontaneous use of compensatory strategies (written cues) when necessary today (written cues) to assist with attention. Continue skilled ST to maximize communication and higher level cognition for independence and QOL.     Speech Therapy Frequency  2x / week    Treatment/Interventions  SLP instruction and feedback;Compensatory strategies;Functional tasks;Cognitive reorganization;Internal/external aids;Patient/family education;Multimodal communcation approach;Environmental controls;Language facilitation;Cueing hierarchy       Patient will benefit from skilled therapeutic intervention in order to improve the following deficits and impairments:   Cognitive communication deficit  Aphasia    Problem List Patient Active Problem List   Diagnosis Date Noted  . Other abnormalities of gait and mobility 09/17/2018  . Acute cerebral infarction ([place])   . Thrombocytopenia ([place])   . 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 Acute blood loss anemia   . Generalized OA   . Gastroesophageal reflux disease   . Seizure prophylaxis   . Leukocytosis   . Tumor 08/18/2018  . 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 MACULAR DEGENERATION 02/29/2008  . ALLERGY 02/29/2008  . Nonspecific (abnormal) findings on radiological and other examination of body structure 12/26/2007  . NONSPECIFIC ABNORM FIND RAD&OTH EXAM LUNG FIELD 12/26/2007  . GALLSTONES 10/01/2007  . ELEVATION, TRANSAMINASE/LDH LEVELS 09/06/2007  . HEPATITIS NOS 09/01/2007  . ABDOMINAL PAIN, EPIGASTRIC 09/01/2007    James Olsen ,[place], CCC-SLP  10/13/2018, 12:18 PM  [facility] [address] [place], [place], [address] Phone: [phone]   Fax:  [phone]   Name: James Olsen MRN: [id number] Date of  Birth: 1945-12-23

## 2018-10-13 NOTE — Patient Instructions (Signed)
  Please complete the assigned speech therapy homework prior to your next session and return it to the speech therapist at your next visit.  

## 2018-10-14 ENCOUNTER — Ambulatory Visit: Payer: Medicare Other | Admitting: Occupational Therapy

## 2018-10-14 ENCOUNTER — Encounter: Payer: Self-pay | Admitting: Physical Therapy

## 2018-10-14 ENCOUNTER — Ambulatory Visit: Payer: Medicare Other

## 2018-10-14 ENCOUNTER — Encounter: Payer: Self-pay | Admitting: Occupational Therapy

## 2018-10-14 ENCOUNTER — Ambulatory Visit: Payer: Medicare Other | Admitting: Physical Therapy

## 2018-10-14 DIAGNOSIS — R41841 Cognitive communication deficit: Secondary | ICD-10-CM

## 2018-10-14 DIAGNOSIS — R4701 Aphasia: Secondary | ICD-10-CM

## 2018-10-14 DIAGNOSIS — R2681 Unsteadiness on feet: Secondary | ICD-10-CM

## 2018-10-14 DIAGNOSIS — R278 Other lack of coordination: Secondary | ICD-10-CM

## 2018-10-14 DIAGNOSIS — R293 Abnormal posture: Secondary | ICD-10-CM

## 2018-10-14 DIAGNOSIS — M6281 Muscle weakness (generalized): Secondary | ICD-10-CM

## 2018-10-14 DIAGNOSIS — I69353 Hemiplegia and hemiparesis following cerebral infarction affecting right non-dominant side: Secondary | ICD-10-CM

## 2018-10-14 DIAGNOSIS — R2689 Other abnormalities of gait and mobility: Secondary | ICD-10-CM

## 2018-10-14 NOTE — Therapy (Signed)
[facility] [address] [place], [place], [address] Phone: [phone]   Fax:  [phone]  Physical Therapy Treatment  Patient Details  Name: James Olsen MRN: [id number] Date of Birth: May 15, 1946 Referring Provider (PT): Dr. Alger Simons   Encounter Date: 10/14/2018  PT End of Session - 10/14/18 1245    Visit Number  9    Number of Visits  16    Date for PT Re-Evaluation  11/06/18    Authorization Type  Medicare Part A&B- Medicare progress note every 10th visit    Authorization Time Period  Follow medicare guidelines     PT Start Time  0930    PT Stop Time  1015    PT Time Calculation (min)  45 min    Activity Tolerance  Patient tolerated treatment well    Behavior During Therapy  [facility] for tasks assessed/performed       Past Medical History:  Diagnosis Date  . Arthritis   . GERD (gastroesophageal reflux disease)   . Glaucoma   . Macular degeneration   . Wears glasses     Past Surgical History:  Procedure Laterality Date  . CHOLECYSTECTOMY  2008   lap choli  . COLONOSCOPY    . CRANIOTOMY N/A 08/18/2018   Procedure: CRANIOTOMY TUMOR EXCISION;  Surgeon: [name], MD;  Location: [facility];  Service: Neurosurgery;  Laterality: N/A;  CRANIOTOMY TUMOR EXCISION  . NAILBED REPAIR Left 05/04/2014   Procedure: LEFT INDEX NAIL ABLATION V-Y FLAP COVERAGE;  Surgeon: [name]., MD;  Location: [facility];  Service: Orthopedics;  Laterality: Left;  index  . PILONIDAL CYST EXCISION     x2  . TONSILLECTOMY    . VASECTOMY      There were no vitals filed for this visit.  Subjective Assessment - 10/14/18 0934    Subjective  Reports everything is going well at home and continues to have improvement in strength and balance. Reports no falls.     Pertinent History  OA, GERD, Macular degeneration, Bilateral LE numbness with onset earlier this year 2/2 left frontal meningioma     Limitations   Walking;Lifting    Patient Stated Goals  Improve motor skills and functional mobility     Currently in Pain?  No/denies        Skilled Physical Therapy Intervention:   Patient verbalizes and demonstrates understanding of the below updated HEP exercises with proper technique to improve functional mobility.  Exercises   Single Leg Bridge - 5 reps - 3 sets - 1x daily - 5x weekly  Single Leg Sit to Stand with Arms Extended - 10 reps - 2 sets - 1x daily - 5x weekly  Wall Squat - 10 reps - 2 sets - 3-5 hold - 1x daily - 5x weekly  Tandem Stance with Head Rotation - 3 sets - 30 hold - 1x daily - 7x weekly  Romberg Stance with Eyes Closed - 3 sets - 30 hold - 1x daily - 7x weekly  Standing Balance with Eyes Closed on Foam - 3 sets - 30 hold - 1x daily - 7x weekly  Romberg Stance on Foam Pad with Head Rotation - 3 sets - 30 hold - 1x daily - 7x weekly  Tandem Walking with Counter Support - 10 reps - 3 sets - 1x daily - 7x weekly       [facility] Adult PT Treatment/Exercise - 10/14/18 1241      Transfers  Transfers  Sit to Stand;Stand to Sit    Sit to Stand  5: Supervision    Stand to Sit  5: Supervision    Comments  Pt performs 1x5 reps of STS from mat with no UE assist; 1x5 STS with feet apart on foam; 1x5 reps with feet together on foam; 1x5 reps with single leg extended on foam; 1x5 with single leg extended on floor; and 1x2 reps with single leg extended and contralateral leg on foam. Pt demonstrates increased challenge with single leg extended and contralateral leg on foam. Pt demonstrates increased challenge on R LE>LLE             PT Education - 10/14/18 1244    Education Details  Therapist provided education regarding updated HEP strengthening and balance exercises with patient verbalizing and demonstrating understanding with proper technique.     Person(s) Educated  Patient    Methods  Explanation    Comprehension  Verbalized understanding       PT Short Term Goals -  10/07/18 0941      PT SHORT TERM GOAL #1   Title  Pt will demonstrate independence and compliancy with HEP to further progress towards improving strength and functional mobility     Baseline  Dependent     Time  4    Period  Weeks    Status  Achieved      PT SHORT TERM GOAL #2   Title  Pt will improve Berg Balance Assessment by 3 points indicating minimal detectable change and decreased fall risk    Baseline  10/17: 54/56     Time  4    Period  Weeks    Status  Achieved      PT SHORT TERM GOAL #3   Title  Pt will improve gait speed to 4.08 ft/sec using LRAD and supervision to improve functional mobility     Baseline  10/17: 4.33 ft/sec indicative of normal walking speed     Time  4    Period  Weeks    Status  Achieved      PT SHORT TERM GOAL #4   Title  Pt will improve FGA score by 4 points to increase dynamic standing during functional gait using no AD    Baseline  10/17: 23/30     Time  4    Period  Weeks    Status  Achieved      PT SHORT TERM GOAL #5   Title  Pt will ambulate 300 feet with LRAD ambulating over uneven surfaces, curbs, ramps, and demonstrate ability to vary gait speed to improve community accessibility safely     Baseline  10/17: Patient achieves goal with therapist providing supervision for safety considerations    Time  4    Period  Weeks    Status  Achieved        PT Long Term Goals - 10/07/18 0943      PT LONG TERM GOAL #1   Title  Pt will demonstrate independence with HEP to further progress towards increasing strength and functional mobility     Time  8    Period  Weeks    Status  New    Target Date  11/06/18      PT LONG TERM GOAL #2   Title  Pt will improve Berg Balance Score to >55/56 to decrease fall risk potential     Baseline  54/56    Time  8  Period  Weeks    Status  Revised    Target Date  11/06/18      PT LONG TERM GOAL #3   Title  Pt will improve FGA score to >27/30 to improve safety and balance during functional mobility  with no AD and supervision     Baseline  23/30    Time  8    Period  Weeks    Status  Revised    Target Date  11/06/18      PT LONG TERM GOAL #4   Title  Pt will ambulate outdoors over uneven surfaces, curbs, ramps, inclines, and demonstrate ability to vary gait speed with LRAD and Mod I to improve safety with community accessibility     Time  8    Period  Weeks    Status  New    Target Date  11/06/18      PT LONG TERM GOAL #5   Title  Pt will negotiate 12 steps using no HR's and recirprocal stepping technique, mod I to increase safety and independence with community accessibility indicating improvement in LE strength    Time  8    Period  Weeks    Status  Revised    Target Date  11/06/18      PT LONG TERM GOAL #6   Title  Pt will improve gait speed to >/= to 4.37 ft/sec indicating normal walking speed with Mod I and LRAD to increase independence with functional mobility     Baseline  4.33 ft/sec at 4 weeks- goal met    Time  --    Period  --    Status  Deferred            Plan - 10/14/18 0932    Clinical Impression Statement  Today's skilled session focused on updating and revising patient HEP with patient verbalizing and demonstrating understanding with proper technique. Patient continues to demonstrate R LE weakness > L LE weakness with increased difficulty perfoming balance activities with narrow BOS and standing on compliant surfaces. Pt will continue to benefit from skilled PT to address strength, balance, and functional mobility deficits.     Rehab Potential  Good    Clinical Impairments Affecting Rehab Potential  R sided weakness with difficulty negotiating obstacles and performing high level balance    PT Frequency  2x / week    PT Duration  8 weeks    PT Treatment/Interventions  ADLs/Self Care Home Management;Gait training;Neuromuscular re-education;Functional mobility training;Stair training;Cognitive remediation;Energy conservation;Patient/family  education;Therapeutic activities;Therapeutic exercise;Orthotic Fit/Training;Balance training;DME Instruction    PT Next Visit Plan  10th visit PN! bosu ball lounges, tall kneeling exercises, core stabilization, high level balance, rocker board with cone tapping, foam with pertubations, bosu ball for hip strategy, ramp with compliant surface, step ups, eccentric quads, glut med strengthening, STS with L LE staggered, bridging, obstacle course/ compliant surface with items under, community ambulation, hurdles, SLS with cone tapping all around,     PT Home Exercise Plan  [id number] & [id number]    Consulted and Agree with Plan of Care  Patient       Patient will benefit from skilled therapeutic intervention in order to improve the following deficits and impairments:  Abnormal gait, Decreased cognition, Decreased knowledge of use of DME, Improper body mechanics, Decreased mobility, Postural dysfunction, Decreased strength, Impaired perceived functional ability, Impaired UE functional use, Decreased balance, Decreased safety awareness, Difficulty walking  Visit Diagnosis: Muscle weakness (generalized)  Abnormal posture  Problem List Patient Active Problem List   Diagnosis Date Noted  . Other abnormalities of gait and mobility 09/17/2018  . Acute cerebral infarction ([place])   . Thrombocytopenia ([place])   . 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 Acute blood loss anemia   . Generalized OA   . Gastroesophageal reflux disease   . Seizure prophylaxis   . Leukocytosis   . Tumor 08/18/2018  . Meningioma determined by biopsy of brain ([place]) 08/18/2018  . MACULAR DEGENERATION 02/29/2008  . ALLERGY 02/29/2008  . Nonspecific (abnormal) findings on radiological and other examination of body structure 12/26/2007  . NONSPECIFIC ABNORM FIND RAD&OTH EXAM LUNG FIELD 12/26/2007  . GALLSTONES 10/01/2007  . ELEVATION, TRANSAMINASE/LDH LEVELS 09/06/2007  . HEPATITIS  NOS 09/01/2007  . ABDOMINAL PAIN, EPIGASTRIC 09/01/2007    Floreen Comber, SPT 10/14/2018, 12:48 PM  [facility] [address] [place], [place], [address] Phone: [phone]   Fax:  [phone]  Name: James Olsen MRN: [id number] Date of Birth: [birth year]

## 2018-10-14 NOTE — Therapy (Signed)
[facility] [address] [place], [place], [address] Phone: [phone]   Fax:  [phone]  Occupational Therapy Treatment  Patient Details  Name: James Olsen MRN: [id number] Date of Birth: 1946/01/25 No data recorded  Encounter Date: 10/14/2018  OT End of Session - 10/14/18 0844    Visit Number  12    Number of Visits  25    Date for OT Re-Evaluation  12/06/18    Authorization Type  cert. 09/07/18-12/06/18    Authorization Time Period  Medicare & [employer], covered 100%    Authorization - Visit Number  12    Authorization - Number of Visits  20    OT Start Time  [phone]    OT Stop Time  [phone]    OT Time Calculation (min)  39 min    Activity Tolerance  Patient tolerated treatment well    Behavior During Therapy  WFL for tasks assessed/performed       Past Medical History:  Diagnosis Date  . Arthritis   . GERD (gastroesophageal reflux disease)   . Glaucoma   . Macular degeneration   . Wears glasses     Past Surgical History:  Procedure Laterality Date  . CHOLECYSTECTOMY  2008   lap choli  . COLONOSCOPY    . CRANIOTOMY N/A 08/18/2018   Procedure: CRANIOTOMY TUMOR EXCISION;  Surgeon: [name], MD;  Location: [facility];  Service: Neurosurgery;  Laterality: N/A;  CRANIOTOMY TUMOR EXCISION  . NAILBED REPAIR Left 05/04/2014   Procedure: LEFT INDEX NAIL ABLATION V-Y FLAP COVERAGE;  Surgeon: [name]., MD;  Location: [facility];  Service: Orthopedics;  Laterality: Left;  index  . PILONIDAL CYST EXCISION     x2  . TONSILLECTOMY    . VASECTOMY      There were no vitals filed for this visit.  Subjective Assessment - 10/14/18 0841    Subjective   Denies pain, reports surgery Novemeber 8 for crainial implant, pt reports that he anticipates 2-3 days in the hospital    Pertinent History  Meningioma s/p craniotomy for tumor resection 08/18/18, Acute cerebral infarction; arthritis, macular  degeneration, hepatitis, GERD    Patient Stated Goals  be back to normal or 95%    Currently in Pain?  No/denies        Completing Purdue Pegboard with min difficulty/incr time for improved coordination.    Functional reaching with RUE to place clothespins on vertical pole for incr coordination and grip strength with minimal tremor.  In standing, shoulder flexion and diagonals with ball with BUEs with 1lb wt on each wrist x10 each, min v.c. For elbow ext with RUE.  Placing grooved pegs in pegboard for incr coordination with min-mod difficulty and min v.c. For shoulder hike.  Discussed progress with cooking and IADL tasks as well as continuing with POC at 2x week.  Pt will receive clearance from MD to return to therapy after surgery.    OT Short Term Goals - 09/28/18 0753      OT SHORT TERM GOAL #1   Title  Pt will be independent with initial HEP.--check STG 10/22/18    Time  6    Period  Weeks    Status  Achieved      OT SHORT TERM GOAL #2   Title  Pt will be able to bathe mod I.    Time  6    Period  Weeks    Status  Achieved      OT SHORT TERM GOAL #3   Title  Pt will be able to complete clothing fasteners mod I.    Time  6    Period  Weeks    Status  Achieved   09/28/18:  incr time     OT SHORT TERM GOAL #4   Title  Pt will be able to cut food mod I.    Time  6    Period  Weeks    Status  Achieved   09/28/18:  able to perform with fork/knife but no chopping/peeling     OT SHORT TERM GOAL #5   Title  Pt will demo at least 60* R shoulder flex with only min compensation for improved functional reach.    Baseline  45* with mod compensation    Period  Weeks    Status  Achieved   09/28/18:  WNL     OT SHORT TERM GOAL #6   Title  Pt will be able to use RUE as stabilizer consistently for ADLs.    Time  6    Period  Weeks    Status  Achieved        OT Long Term Goals - 09/28/18 0757      OT LONG TERM GOAL #1   Title  Pt will be independent with updated  HEP.--check LTGs 12/06/18    Time  12    Period  Weeks    Status  On-going      OT LONG TERM GOAL #2   Title  Pt will perform simple cooking tasks mod I.--09/28/18 upgraded to mod complex cooking involving carrying pots/pans and chopping/peeling with RUE    Time  12    Period  Weeks    Status  Revised   09/28/18:  met initial goal and upgraded     OT LONG TERM GOAL #3   Title  Pt will perform mod complex home maintenance tasks mod I.    Time  12    Period  Weeks    Status  On-going      OT LONG TERM GOAL #4   Title  Pt will use RUE as nondominant assist at least 75% of the time for ADLs.---upgraded 09/28/18:  will use RUE 100% of the time for previous ADL/IADL tasks.    Time  8    Period  Weeks    Status  Revised   09/28/18:  met initial goal and upgraded     OT LONG TERM GOAL #5   Title  Pt will demo at least 90* R shoulder flex for functional reach with min compensation.  09/28/18:  Pt will be able to retrieve 2-3lb object from overhead shelf with good control x3.      Period  Weeks    Status  Revised   09/28/18:  initial goal met and upgraded     OT LONG TERM GOAL #6   Title  Pt will be able to carry light bag of groceries with RUE.--09/28/18  upgrade to 5lb bag of groceries    Time  12    Period  Weeks    Status  Revised   09/28/18:  met initial goal and upgraded     OT LONG TERM GOAL #7   Title  Pt will demo improved coordination/RUE functional reach as shown by scoring at least 30 on box and blocks test.--09/28/18:  upgrade to 40 blocks    Time  12  Period  Weeks    Status  Revised   09/28/18:  30 blocks     OT LONG TERM GOAL #8   Title  Pt will demo at least 35lbs R grip strength for opening containers/carrying objects.--09/28/18 upgrade to 45lbs     Baseline  22lbs    Time  12    Period  Weeks    Status  Revised   09/28/18:  34lbs     OT LONG TERM GOAL  #9   Baseline  Pt will improve coordination with R dominant UE as shown by completing 9-hole peg test in less  than 60sec.    Time  12    Period  Weeks    Status  New   Goal added 09/28/18 (baseline 102.28sec):           Plan - 10/14/18 0851    Clinical Impression Statement  Pt continues to demonstrate excellent overall progress, with improving RUE functional use, coordination and strength.    Occupational Profile and client history currently impacting functional performance  Pt was very independent and active prior to surgery.  Pt drove, performed yardwork, most cooking/home maintenance tasks, and enjoyed outdoor leisure activities.  Pt is currently not able to drive, perform yardwork, perform previous cooking/home maintenance tasks or leisure tasks.  Pt's goal is to return to 90-100% of "normal"    Occupational performance deficits (Please refer to evaluation for details):  ADL's;IADL's;Social Participation;Leisure    Rehab Potential  Good    OT Frequency  2x / week    OT Duration  12 weeks    OT Treatment/Interventions  Self-care/ADL training;Cryotherapy;Paraffin;Therapeutic exercise;DME and/or AE instruction;Functional Mobility Training;Cognitive remediation/compensation;Balance training;Splinting;Manual Therapy;Neuromuscular education;Fluidtherapy;Ultrasound;Electrical Stimulation;Aquatic Therapy;Moist Heat;Energy conservation;Passive range of motion;Therapeutic activities;Patient/family education    Plan  neuro re-ed RUE, RUE functional use, wt. bearing, coordination, upcoming surgery 10/29/18.  Pt would like to continue at 2x/week    OT Home Exercise Plan  Education provided:  edema management, proper positioning of RUE, initial HEP with ball, red putty HEP, coordination HEP    Consulted and Agree with Plan of Care  Patient       Patient will benefit from skilled therapeutic intervention in order to improve the following deficits and impairments:  Abnormal gait, Decreased balance, Decreased endurance, Decreased mobility, Decreased range of motion, Decreased cognition, Decreased activity  tolerance, Decreased coordination, Decreased knowledge of use of DME, Decreased strength, Impaired flexibility, Impaired UE functional use  Visit Diagnosis: Hemiplegia and hemiparesis following cerebral infarction affecting right non-dominant side (HCC)  Other lack of coordination  Abnormal posture  Unsteadiness on feet  Other abnormalities of gait and mobility  Muscle weakness (generalized)    Problem List Patient Active Problem List   Diagnosis Date Noted  . Other abnormalities of gait and mobility 09/17/2018  . Acute cerebral infarction ([place])   . Thrombocytopenia ([place])   . 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 Acute blood loss anemia   . Generalized OA   . Gastroesophageal reflux disease   . Seizure prophylaxis   . Leukocytosis   . Tumor 08/18/2018  . Meningioma determined by biopsy of brain ([place]) 08/18/2018  . MACULAR DEGENERATION 02/29/2008  . ALLERGY 02/29/2008  . Nonspecific (abnormal) findings on radiological and other examination of body structure 12/26/2007  . NONSPECIFIC ABNORM FIND RAD&OTH EXAM LUNG FIELD 12/26/2007  . GALLSTONES 10/01/2007  . ELEVATION, TRANSAMINASE/LDH LEVELS 09/06/2007  . HEPATITIS NOS 09/01/2007  . ABDOMINAL PAIN, EPIGASTRIC  09/01/2007    [facility] 10/14/2018, 9:01 AM  [facility] [address] [place], [place], [address] Phone: [phone]   Fax:  [phone]  Name: SEIYA SILSBY MRN: [id number] Date of Birth: [birth year]   Vianne Bulls, OTR/L [facility] [address] [place], [place]  [address] [phone] phone [phone] 10/14/18 9:01 AM

## 2018-10-14 NOTE — Patient Instructions (Signed)
Access Code: [email or web address]  URL: [email or web address]  Date: 10/14/2018  Prepared by: Floreen Comber   Exercises  Side Stepping with Resistance at Thighs - 5 reps - 3 sets - 1x daily - 7x weekly  Standing Gastroc Stretch at Counter - 3 reps - 1 sets - 30 hold - 1x daily - 5x weekly  Single Leg Bridge - 5 reps - 3 sets - 1x daily - 5x weekly  Single Leg Sit to Stand with Arms Extended - 10 reps - 2 sets - 1x daily - 5x weekly  Wall Squat - 10 reps - 2 sets - 3-5 hold - 1x daily - 5x weekly  Tandem Stance with Head Rotation - 3 sets - 30 hold - 1x daily - 7x weekly  Romberg Stance with Eyes Closed - 3 sets - 30 hold - 1x daily - 7x weekly  Standing Balance with Eyes Closed on Foam - 3 sets - 30 hold - 1x daily - 7x weekly  Romberg Stance on Foam Pad with Head Rotation - 3 sets - 30 hold - 1x daily - 7x weekly  Tandem Walking with Counter Support - 10 reps - 3 sets - 1x daily - 7x weekly

## 2018-10-14 NOTE — Therapy (Signed)
[facility] [address] [place], [place], [address] Phone: [phone]   Fax:  [phone]  Speech Language Pathology Treatment  Patient Details  Name: James Olsen MRN: [id number] Date of Birth: 1946-10-07 Referring Provider (SLP): Alger Simons MD   Encounter Date: 10/14/2018  End of Session - 10/14/18 1220    Visit Number  12    Number of Visits  17    Date for SLP Re-Evaluation  12/03/18    SLP Start Time  1017    SLP Stop Time   1100    SLP Time Calculation (min)  43 min    Activity Tolerance  Patient tolerated treatment well       Past Medical History:  Diagnosis Date  . Arthritis   . GERD (gastroesophageal reflux disease)   . Glaucoma   . Macular degeneration   . Wears glasses     Past Surgical History:  Procedure Laterality Date  . CHOLECYSTECTOMY  2008   lap choli  . COLONOSCOPY    . CRANIOTOMY N/A 08/18/2018   Procedure: CRANIOTOMY TUMOR EXCISION;  Surgeon: [name], MD;  Location: [facility];  Service: Neurosurgery;  Laterality: N/A;  CRANIOTOMY TUMOR EXCISION  . NAILBED REPAIR Left 05/04/2014   Procedure: LEFT INDEX NAIL ABLATION V-Y FLAP COVERAGE;  Surgeon: [name]., MD;  Location: [facility];  Service: Orthopedics;  Laterality: Left;  index  . PILONIDAL CYST EXCISION     x2  . TONSILLECTOMY    . VASECTOMY      There were no vitals filed for this visit.  Subjective Assessment - 10/14/18 1025    Subjective  Pt explained the scheduling of the other two disciplines with excellent success.    Currently in Pain?  No/denies            ADULT SLP TREATMENT - 10/14/18 1026      General Information   Behavior/Cognition  Alert;Cooperative;Pleasant mood      Treatment Provided   Treatment provided  Cognitive-Linquistic      Cognitive-Linquistic Treatment   Treatment focused on  Cognition    Skilled Treatment  Pt explained scheduling from this point  onward for other two disciplines with excellent success. In detailed written task, pt req'd min A usually for adhering to details; pt had not double checked his work and this req'd the corrections. In a nonverbal detailed task (8-stwp picture sequencing and verbalizing sequences) pt req'd min A rarely, but took extra time consistently.       Assessment / Recommendations / Plan   Plan  Continue with current plan of care      Progression Toward Goals   Progression toward goals  Progressing toward goals       SLP Education - 10/14/18 1219    Education Details  need to couble check all work    [employer]) Educated  Patient    Methods  Explanation    Comprehension  Verbalized understanding       SLP Short Term Goals - 10/07/18 1217      SLP SHORT TERM GOAL #1   Title  pt will provide sentence responses 18/20 with WFL speech fluidity over three sessions    Status  Achieved      SLP SHORT TERM GOAL #2   Title  pt will undergo formal cognitive linguistic assessment by visit number 3    Status  Achieved      SLP SHORT TERM GOAL #  3   Title  using compensatory strategies pt will functionally participate in 10 minutes simple conversation over 3 sessions    Time  1    Status  Achieved       SLP Long Term Goals - 10/14/18 1222      SLP LONG TERM GOAL #1   Title  using compensatory strategies pt will functionally participate in 10 minutes mod complex conversation over 3 sessions    Time  3    Period  Weeks    Status  On-going      SLP LONG TERM GOAL #2   Title  pt will functionally perform complex naming tasks with use of compensations over 2 sessions    Baseline  09/09/18    Time  3    Period  Weeks    Status  On-going      SLP LONG TERM GOAL #3   Title  pt will demo WNL emergent awareness with mod complex tasks in order to acheive 100% success over three sessions    Time  3    Period  Weeks    Status  On-going       Plan - 10/14/18 1220    Clinical Impression Statement   Pt's language in mod complex conversation today, of 6 mintues, was WNL. Pt cont'd to demo high level attention and awareness impairments today in functional, and simple but detailed written tasks, extra time allowed consistently. Written cues when necessary today assisted with organization and with attention. Continue skilled ST to maximize communication and higher level cognition for independence and QOL.     Speech Therapy Frequency  2x / week    Treatment/Interventions  SLP instruction and feedback;Compensatory strategies;Functional tasks;Cognitive reorganization;Internal/external aids;Patient/family education;Multimodal communcation approach;Environmental controls;Language facilitation;Cueing hierarchy       Patient will benefit from skilled therapeutic intervention in order to improve the following deficits and impairments:   Cognitive communication deficit  Aphasia    Problem List Patient Active Problem List   Diagnosis Date Noted  . Other abnormalities of gait and mobility 09/17/2018  . Acute cerebral infarction ([facility])   . Thrombocytopenia ([facility])   . 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 Acute blood loss anemia   . Generalized OA   . Gastroesophageal reflux disease   . Seizure prophylaxis   . Leukocytosis   . Tumor 08/18/2018  . 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 MACULAR DEGENERATION 02/29/2008  . ALLERGY 02/29/2008  . Nonspecific (abnormal) findings on radiological and other examination of body structure 12/26/2007  . NONSPECIFIC ABNORM FIND RAD&OTH EXAM LUNG FIELD 12/26/2007  . GALLSTONES 10/01/2007  . ELEVATION, TRANSAMINASE/LDH LEVELS 09/06/2007  . HEPATITIS NOS 09/01/2007  . ABDOMINAL PAIN, EPIGASTRIC 09/01/2007    Ashia Dehner ,[place], CCC-SLP  10/14/2018, 12:23 PM  [facility] [address] [place], [place], [address] Phone: [phone]    Fax:  [phone]   Name: James Olsen MRN: [id number] Date of Birth: 1946/04/14

## 2018-10-14 NOTE — Patient Instructions (Signed)
  Please complete the assigned speech therapy homework prior to your next session and return it to the speech therapist at your next visit.  

## 2018-10-18 ENCOUNTER — Ambulatory Visit: Payer: Medicare Other | Admitting: Occupational Therapy

## 2018-10-18 ENCOUNTER — Ambulatory Visit: Payer: Medicare Other

## 2018-10-18 ENCOUNTER — Encounter: Payer: Self-pay | Admitting: Occupational Therapy

## 2018-10-18 ENCOUNTER — Ambulatory Visit: Payer: Medicare Other | Admitting: Physical Therapy

## 2018-10-18 DIAGNOSIS — R4701 Aphasia: Secondary | ICD-10-CM

## 2018-10-18 DIAGNOSIS — R2689 Other abnormalities of gait and mobility: Secondary | ICD-10-CM

## 2018-10-18 DIAGNOSIS — I69353 Hemiplegia and hemiparesis following cerebral infarction affecting right non-dominant side: Secondary | ICD-10-CM

## 2018-10-18 DIAGNOSIS — R278 Other lack of coordination: Secondary | ICD-10-CM

## 2018-10-18 DIAGNOSIS — R41841 Cognitive communication deficit: Secondary | ICD-10-CM

## 2018-10-18 DIAGNOSIS — R2681 Unsteadiness on feet: Secondary | ICD-10-CM

## 2018-10-18 DIAGNOSIS — R293 Abnormal posture: Secondary | ICD-10-CM

## 2018-10-18 DIAGNOSIS — M6281 Muscle weakness (generalized): Secondary | ICD-10-CM | POA: Diagnosis not present

## 2018-10-18 NOTE — Patient Instructions (Addendum)
   All Fours Shoulder Flexion / Extension    Place hands and knees shoulder-width apart, rock back and sit on legs, then rock forward over hands and forearms. Repeat 10 times. Do 1 sessions per day.    Angry Cat, All Fours    Kneel on hands and knees. Tuck chin and tighten stomach. Exhale and round back upward. Inhale and arch back downward. Hold each position 5-10 seconds. Repeat 10 times per session. Do 1 sessions per day.

## 2018-10-18 NOTE — Therapy (Signed)
[facility] [address] [place], [place], [address] Phone: [phone]   Fax:  [phone]  Occupational Therapy Treatment  Patient Details  Name: James Olsen MRN: [id number] Date of Birth: 05-30-1946 No data recorded  Encounter Date: 10/18/2018  OT End of Session - 10/18/18 0847    Visit Number  13    Number of Visits  25    Date for OT Re-Evaluation  12/06/18    Authorization Type  cert. 09/07/18-12/06/18    Authorization Time Period  Medicare & [employer], covered 100%    Authorization - Visit Number  53    Authorization - Number of Visits  20    OT Start Time  [phone]    OT Stop Time  [phone]    OT Time Calculation (min)  39 min    Activity Tolerance  Patient tolerated treatment well    Behavior During Therapy  WFL for tasks assessed/performed       Past Medical History:  Diagnosis Date  . Arthritis   . GERD (gastroesophageal reflux disease)   . Glaucoma   . Macular degeneration   . Wears glasses     Past Surgical History:  Procedure Laterality Date  . CHOLECYSTECTOMY  2008   lap choli  . COLONOSCOPY    . CRANIOTOMY N/A 08/18/2018   Procedure: CRANIOTOMY TUMOR EXCISION;  Surgeon: [name], MD;  Location: [facility];  Service: Neurosurgery;  Laterality: N/A;  CRANIOTOMY TUMOR EXCISION  . NAILBED REPAIR Left 05/04/2014   Procedure: LEFT INDEX NAIL ABLATION V-Y FLAP COVERAGE;  Surgeon: [name]., MD;  Location: [facility];  Service: Orthopedics;  Laterality: Left;  index  . PILONIDAL CYST EXCISION     x2  . TONSILLECTOMY    . VASECTOMY      There were no vitals filed for this visit.  Subjective Assessment - 10/18/18 0846    Subjective   good weekend    Pertinent History  Meningioma s/p craniotomy for tumor resection 08/18/18, Acute cerebral infarction; arthritis, macular degeneration, hepatitis, GERD    Patient Stated Goals  be back to normal or 95%    Currently in Pain?   No/denies       Functional reaching to place small pegs in vertical pegboard to copy design with incr time and min difficulty/drops.  Removing using in-hand manipulation (3 at a time)  In tall kneeling, rolling ball forward/back for incr core stability and shoulder flexion.   Quadruped, cat/cow positions, forward/back wt. Shifts for incr core/scapular stability and strength, min cueing  Prone, scapular exercises with shoulders in extension x10, min cueing.   Rotating 2 golf balls in R hand with mod difficulty with fatigue, improved with repetition for incr coordination.   OT Education - 10/18/18 0912    Education Details  Upgraded putty HEP to green; Core/scapular stability HEP    Person(s) Educated  Patient    Methods  Explanation;Demonstration    Comprehension  Verbalized understanding;Returned demonstration;Verbal cues required       OT Short Term Goals - 09/28/18 0753      OT SHORT TERM GOAL #1   Title  Pt will be independent with initial HEP.--check STG 10/22/18    Time  6    Period  Weeks    Status  Achieved      OT SHORT TERM GOAL #2   Title  Pt will be able to bathe mod I.    Time  6  Period  Weeks    Status  Achieved      OT SHORT TERM GOAL #3   Title  Pt will be able to complete clothing fasteners mod I.    Time  6    Period  Weeks    Status  Achieved   09/28/18:  incr time     OT SHORT TERM GOAL #4   Title  Pt will be able to cut food mod I.    Time  6    Period  Weeks    Status  Achieved   09/28/18:  able to perform with fork/knife but no chopping/peeling     OT SHORT TERM GOAL #5   Title  Pt will demo at least 60* R shoulder flex with only min compensation for improved functional reach.    Baseline  45* with mod compensation    Period  Weeks    Status  Achieved   09/28/18:  WNL     OT SHORT TERM GOAL #6   Title  Pt will be able to use RUE as stabilizer consistently for ADLs.    Time  6    Period  Weeks    Status  Achieved        OT Long  Term Goals - 09/28/18 0757      OT LONG TERM GOAL #1   Title  Pt will be independent with updated HEP.--check LTGs 12/06/18    Time  12    Period  Weeks    Status  On-going      OT LONG TERM GOAL #2   Title  Pt will perform simple cooking tasks mod I.--09/28/18 upgraded to mod complex cooking involving carrying pots/pans and chopping/peeling with RUE    Time  12    Period  Weeks    Status  Revised   09/28/18:  met initial goal and upgraded     OT LONG TERM GOAL #3   Title  Pt will perform mod complex home maintenance tasks mod I.    Time  12    Period  Weeks    Status  On-going      OT LONG TERM GOAL #4   Title  Pt will use RUE as nondominant assist at least 75% of the time for ADLs.---upgraded 09/28/18:  will use RUE 100% of the time for previous ADL/IADL tasks.    Time  8    Period  Weeks    Status  Revised   09/28/18:  met initial goal and upgraded     OT LONG TERM GOAL #5   Title  Pt will demo at least 90* R shoulder flex for functional reach with min compensation.  09/28/18:  Pt will be able to retrieve 2-3lb object from overhead shelf with good control x3.      Period  Weeks    Status  Revised   09/28/18:  initial goal met and upgraded     OT LONG TERM GOAL #6   Title  Pt will be able to carry light bag of groceries with RUE.--09/28/18  upgrade to 5lb bag of groceries    Time  12    Period  Weeks    Status  Revised   09/28/18:  met initial goal and upgraded     OT LONG TERM GOAL #7   Title  Pt will demo improved coordination/RUE functional reach as shown by scoring at least 30 on box and blocks test.--09/28/18:  upgrade to 40 blocks  Time  12    Period  Weeks    Status  Revised   09/28/18:  30 blocks     OT LONG TERM GOAL #8   Title  Pt will demo at least 35lbs R grip strength for opening containers/carrying objects.--09/28/18 upgrade to 45lbs     Baseline  22lbs    Time  12    Period  Weeks    Status  Revised   09/28/18:  34lbs     OT LONG TERM GOAL  #9    Baseline  Pt will improve coordination with R dominant UE as shown by completing 9-hole peg test in less than 60sec.    Time  12    Period  Weeks    Status  New   Goal added 09/28/18 (baseline 102.28sec):           Plan - 10/18/18 0847    Clinical Impression Statement  Pt continues to demonstrate excellent overall progress, with improving RUE functional use, coordination and strength, but fatigues quickly.    Occupational Profile and client history currently impacting functional performance  Pt was very independent and active prior to surgery.  Pt drove, performed yardwork, most cooking/home maintenance tasks, and enjoyed outdoor leisure activities.  Pt is currently not able to drive, perform yardwork, perform previous cooking/home maintenance tasks or leisure tasks.  Pt's goal is to return to 90-100% of "normal"    Occupational performance deficits (Please refer to evaluation for details):  ADL's;IADL's;Social Participation;Leisure    Rehab Potential  Good    OT Frequency  2x / week    OT Duration  12 weeks    OT Treatment/Interventions  Self-care/ADL training;Cryotherapy;Paraffin;Therapeutic exercise;DME and/or AE instruction;Functional Mobility Training;Cognitive remediation/compensation;Balance training;Splinting;Manual Therapy;Neuromuscular education;Fluidtherapy;Ultrasound;Electrical Stimulation;Aquatic Therapy;Moist Heat;Energy conservation;Passive range of motion;Therapeutic activities;Patient/family education    Plan  neuro re-ed RUE, RUE functional use, wt. bearing, coordination, upcoming surgery 10/29/18.      OT Home Exercise Plan  Education provided:  edema management, proper positioning of RUE, initial HEP with ball, red putty HEP, coordination HEP    Consulted and Agree with Plan of Care  Patient       Patient will benefit from skilled therapeutic intervention in order to improve the following deficits and impairments:  Abnormal gait, Decreased balance, Decreased endurance,  Decreased mobility, Decreased range of motion, Decreased cognition, Decreased activity tolerance, Decreased coordination, Decreased knowledge of use of DME, Decreased strength, Impaired flexibility, Impaired UE functional use  Visit Diagnosis: Hemiplegia and hemiparesis following cerebral infarction affecting right non-dominant side (HCC)  Other lack of coordination  Abnormal posture  Unsteadiness on feet  Other abnormalities of gait and mobility    Problem List Patient Active Problem List   Diagnosis Date Noted  . Other abnormalities of gait and mobility 09/17/2018  . Acute cerebral infarction ([facility])   . Thrombocytopenia ([facility])   . 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 Acute blood loss anemia   . Generalized OA   . Gastroesophageal reflux disease   . Seizure prophylaxis   . Leukocytosis   . Tumor 08/18/2018  . 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 MACULAR DEGENERATION 02/29/2008  . ALLERGY 02/29/2008  . Nonspecific (abnormal) findings on radiological and other examination of body structure 12/26/2007  . NONSPECIFIC ABNORM FIND RAD&OTH EXAM LUNG FIELD 12/26/2007  . GALLSTONES 10/01/2007  . ELEVATION, TRANSAMINASE/LDH LEVELS 09/06/2007  . HEPATITIS NOS 09/01/2007  . ABDOMINAL PAIN, EPIGASTRIC 09/01/2007  [facility] 10/18/2018, 9:13 AM  [facility] [address] [place], [place], [address] Phone: [phone]   Fax:  [phone]  Name: KALIJAH ZEISS MRN: [id number] Date of Birth: 1946/03/09   Vianne Bulls, OTR/L [facility] [address] [place], [place]  [address] [phone] phone [phone] 10/18/18 9:13 AM

## 2018-10-18 NOTE — Patient Instructions (Signed)
  Please complete the assigned speech therapy homework prior to your next session and return it to the speech therapist at your next visit.  

## 2018-10-18 NOTE — Therapy (Signed)
[facility] [address] [place], [place], [address] Phone: [phone]   Fax:  [phone]  Speech Language Pathology Treatment  Patient Details  Name: James Olsen MRN: [id number] Date of Birth: 1946-12-17 Referring Provider (SLP): Alger Simons MD   Encounter Date: 10/18/2018  End of Session - 10/18/18 1231    Visit Number  13    Number of Visits  17    Date for SLP Re-Evaluation  12/03/18    SLP Start Time  0936    SLP Stop Time   1016    SLP Time Calculation (min)  40 min    Activity Tolerance  Patient tolerated treatment well       Past Medical History:  Diagnosis Date  . Arthritis   . GERD (gastroesophageal reflux disease)   . Glaucoma   . Macular degeneration   . Wears glasses     Past Surgical History:  Procedure Laterality Date  . CHOLECYSTECTOMY  2008   lap choli  . COLONOSCOPY    . CRANIOTOMY N/A 08/18/2018   Procedure: CRANIOTOMY TUMOR EXCISION;  Surgeon: [name], MD;  Location: [facility];  Service: Neurosurgery;  Laterality: N/A;  CRANIOTOMY TUMOR EXCISION  . NAILBED REPAIR Left 05/04/2014   Procedure: LEFT INDEX NAIL ABLATION V-Y FLAP COVERAGE;  Surgeon: [name]., MD;  Location: [facility];  Service: Orthopedics;  Laterality: Left;  index  . PILONIDAL CYST EXCISION     x2  . TONSILLECTOMY    . VASECTOMY      There were no vitals filed for this visit.         ADULT SLP TREATMENT - 10/18/18 1005      General Information   Behavior/Cognition  Alert;Cooperative;Pleasant mood      Treatment Provided   Treatment provided  Cognitive-Linquistic      Cognitive-Linquistic Treatment   Treatment focused on  Aphasia;Cognition    Skilled Treatment  (Speech tx <30 minutes): SLP engaged pt in 12 minutes mod-max complex conversation with pt to target pt's ability with word finding - WFL/WNL. (cognitive tx: 25 minutes) SLP worked through [employer] homework with  him - 70% success due to decr'd attention to detail. Pt suggested underlining key words in written questions (anticipatory awareness). Pt's emergent awareness in other detailed written tasks req'd SLP consistent min cues.      Assessment / Recommendations / Plan   Plan  Continue with current plan of care      Progression Toward Goals   Progression toward goals  Progressing toward goals       SLP Education - 10/18/18 1018    Education Details  need to not only double check computations but also details in the stimulus/questions    Person(s) Educated  Patient    Methods  Explanation    Comprehension  Verbalized understanding;Returned demonstration       SLP Short Term Goals - 10/07/18 1217      SLP SHORT TERM GOAL #1   Title  pt will provide sentence responses 18/20 with WFL speech fluidity over three sessions    Status  Achieved      SLP SHORT TERM GOAL #2   Title  pt will undergo formal cognitive linguistic assessment by visit number 3    Status  Achieved      SLP SHORT TERM GOAL #3   Title  using compensatory strategies pt will functionally participate in 10 minutes simple conversation over 3 sessions  Time  1    Status  Achieved       SLP Long Term Goals - 10/18/18 1003      SLP LONG TERM GOAL #1   Title  using compensatory strategies pt will functionally participate in 10 minutes mod complex conversation over 3 sessions    Baseline  10-18-18    Time  2    Period  Weeks    Status  On-going      SLP LONG TERM GOAL #2   Title  pt will functionally perform complex naming tasks with use of compensations over 2 sessions    Status  Achieved      SLP LONG TERM GOAL #3   Title  pt will demo WNL emergent awareness with mod complex tasks in order to acheive 100% success over three sessions    Time  3    Period  Weeks    Status  On-going       Plan - 10/18/18 1232    Clinical Impression Statement  Pt's language in mod (to max) complex conversation today, of 12  mintues, was WFL/ANL. Pt cont'd to demo high level attention and awareness impairments today in functional, and simple but detailed written tasks, extra time allowed consistently. Continue skilled ST to maximize communication and higher level cognition for independence and QOL.     Speech Therapy Frequency  2x / week    Duration  --   8 weeks or 17 sessions   Treatment/Interventions  SLP instruction and feedback;Compensatory strategies;Functional tasks;Cognitive reorganization;Internal/external aids;Patient/family education;Multimodal communcation approach;Environmental controls;Language facilitation;Cueing hierarchy    Potential to Achieve Goals  Good    Potential Considerations  Severity of impairments       Patient will benefit from skilled therapeutic intervention in order to improve the following deficits and impairments:   Aphasia  Cognitive communication deficit    Problem List Patient Active Problem List   Diagnosis Date Noted  . Other abnormalities of gait and mobility 09/17/2018  . Acute cerebral infarction ([facility])   . Thrombocytopenia ([facility])   . 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 Acute blood loss anemia   . Generalized OA   . Gastroesophageal reflux disease   . Seizure prophylaxis   . Leukocytosis   . Tumor 08/18/2018  . 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 MACULAR DEGENERATION 02/29/2008  . ALLERGY 02/29/2008  . Nonspecific (abnormal) findings on radiological and other examination of body structure 12/26/2007  . NONSPECIFIC ABNORM FIND RAD&OTH EXAM LUNG FIELD 12/26/2007  . GALLSTONES 10/01/2007  . ELEVATION, TRANSAMINASE/LDH LEVELS 09/06/2007  . HEPATITIS NOS 09/01/2007  . ABDOMINAL PAIN, EPIGASTRIC 09/01/2007    Cheryll Keisler ,MS, CCC-SLP  10/18/2018, 1:22 PM  [facility] [address] [place], [place], [address] Phone:  [phone]   Fax:  [phone]   Name: James Olsen MRN: [id number] Date of Birth: July 08, 1946

## 2018-10-20 ENCOUNTER — Ambulatory Visit: Payer: Medicare Other

## 2018-10-20 ENCOUNTER — Encounter: Payer: Self-pay | Admitting: Physical Therapy

## 2018-10-20 ENCOUNTER — Ambulatory Visit: Payer: Medicare Other | Admitting: Physical Therapy

## 2018-10-20 ENCOUNTER — Ambulatory Visit: Payer: Medicare Other | Admitting: Occupational Therapy

## 2018-10-20 DIAGNOSIS — R41841 Cognitive communication deficit: Secondary | ICD-10-CM

## 2018-10-20 DIAGNOSIS — R293 Abnormal posture: Secondary | ICD-10-CM

## 2018-10-20 DIAGNOSIS — M6281 Muscle weakness (generalized): Secondary | ICD-10-CM | POA: Diagnosis not present

## 2018-10-20 DIAGNOSIS — R278 Other lack of coordination: Secondary | ICD-10-CM

## 2018-10-20 DIAGNOSIS — I69353 Hemiplegia and hemiparesis following cerebral infarction affecting right non-dominant side: Secondary | ICD-10-CM

## 2018-10-20 DIAGNOSIS — R2689 Other abnormalities of gait and mobility: Secondary | ICD-10-CM

## 2018-10-20 DIAGNOSIS — R4701 Aphasia: Secondary | ICD-10-CM

## 2018-10-20 NOTE — Therapy (Signed)
[facility] [address] [place], [place], [address] Phone: [phone]   Fax:  [phone]  Speech Language Pathology Treatment  Patient Details  Name: James Olsen MRN: [id number] Date of Birth: 06-09-1946 Referring Provider (SLP): Alger Simons MD   Encounter Date: 10/20/2018  End of Session - 10/20/18 1005    Visit Number  14    Number of Visits  17    Date for SLP Re-Evaluation  12/03/18    SLP Start Time  0848    SLP Stop Time   0930    SLP Time Calculation (min)  42 min    Activity Tolerance  Patient tolerated treatment well       Past Medical History:  Diagnosis Date  . Arthritis   . GERD (gastroesophageal reflux disease)   . Glaucoma   . Macular degeneration   . Wears glasses     Past Surgical History:  Procedure Laterality Date  . CHOLECYSTECTOMY  2008   lap choli  . COLONOSCOPY    . CRANIOTOMY N/A 08/18/2018   Procedure: CRANIOTOMY TUMOR EXCISION;  Surgeon: [name], MD;  Location: [facility];  Service: Neurosurgery;  Laterality: N/A;  CRANIOTOMY TUMOR EXCISION  . NAILBED REPAIR Left 05/04/2014   Procedure: LEFT INDEX NAIL ABLATION V-Y FLAP COVERAGE;  Surgeon: [name]., MD;  Location: [facility];  Service: Orthopedics;  Laterality: Left;  index  . PILONIDAL CYST EXCISION     x2  . TONSILLECTOMY    . VASECTOMY      There were no vitals filed for this visit.  Subjective Assessment - 10/20/18 0851    Subjective  Pt arrived with homework completed.     Currently in Pain?  No/denies            ADULT SLP TREATMENT - 10/20/18 0852      General Information   Behavior/Cognition  Alert;Cooperative;Pleasant mood      Treatment Provided   Treatment provided  Cognitive-Linquistic      Cognitive-Linquistic Treatment   Treatment focused on  Cognition    Skilled Treatment  SLP targeted pt's attention to linguistic details in written direction following  today. Pt with extra time necessary in adding detailed figures. Pt double-checked his work, without emergent awareness - error in the first 3 responses. Pt suggested rechecking a little time after completing the work to have a "fresh" mind. (anticipatory awareness). Attention also targeted during this session (alternating attention) - pt was WNL with attention to task after switch. Divided attention - pt switched after appropriately directed times that he had to remember, provided by SLP.       Assessment / Recommendations / Plan   Plan  Continue with current plan of care      Progression Toward Goals   Progression toward goals  Progressing toward goals         SLP Short Term Goals - 10/07/18 1217      SLP SHORT TERM GOAL #1   Title  pt will provide sentence responses 18/20 with WFL speech fluidity over three sessions    Status  Achieved      SLP SHORT TERM GOAL #2   Title  pt will undergo formal cognitive linguistic assessment by visit number 3    Status  Achieved      SLP SHORT TERM GOAL #3   Title  using compensatory strategies pt will functionally participate in 10 minutes simple conversation over 3 sessions  Time  1    Status  Achieved       SLP Long Term Goals - 10/20/18 1008      SLP LONG TERM GOAL #1   Title  using compensatory strategies pt will functionally participate in 10 minutes mod complex conversation over 3 sessions    Baseline  10-18-18, 10-20-18    Time  2    Period  Weeks    Status  On-going      SLP LONG TERM GOAL #2   Title  pt will functionally perform complex naming tasks with use of compensations over 2 sessions    Status  Achieved      SLP LONG TERM GOAL #3   Title  pt will demo WNL emergent awareness with mod complex tasks in order to acheive 100% success over three sessions    Time  2    Period  Weeks    Status  On-going       Plan - 10/20/18 1005    Clinical Impression Statement  Pt's language in 10 minutes mod complex conversation  today was WFL/WNL. Pt cont'd to demo high level attentional/emergent awareness impairments today in simple but detailed written tasks, extra time allowed consistently. Continue skilled ST to maximize communication and higher level cognition for independence and QOL.     Speech Therapy Frequency  2x / week    Duration  --   8 weeks or 17 sessions   Treatment/Interventions  SLP instruction and feedback;Compensatory strategies;Functional tasks;Cognitive reorganization;Internal/external aids;Patient/family education;Multimodal communcation approach;Environmental controls;Language facilitation;Cueing hierarchy    Potential to Achieve Goals  Good    Potential Considerations  Severity of impairments       Patient will benefit from skilled therapeutic intervention in order to improve the following deficits and impairments:   Aphasia  Cognitive communication deficit    Problem List Patient Active Problem List   Diagnosis Date Noted  . Other abnormalities of gait and mobility 09/17/2018  . Acute cerebral infarction ([facility])   . Thrombocytopenia ([facility])   . 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 Acute blood loss anemia   . Generalized OA   . Gastroesophageal reflux disease   . Seizure prophylaxis   . Leukocytosis   . Tumor 08/18/2018  . 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 MACULAR DEGENERATION 02/29/2008  . ALLERGY 02/29/2008  . Nonspecific (abnormal) findings on radiological and other examination of body structure 12/26/2007  . NONSPECIFIC ABNORM FIND RAD&OTH EXAM LUNG FIELD 12/26/2007  . GALLSTONES 10/01/2007  . ELEVATION, TRANSAMINASE/LDH LEVELS 09/06/2007  . HEPATITIS NOS 09/01/2007  . ABDOMINAL PAIN, EPIGASTRIC 09/01/2007    James Olsen ,MS, CCC-SLP  10/20/2018, 10:09 AM  [facility] [address] [place], [place], [address] Phone: [phone]   Fax:   [phone]   Name: James Olsen MRN: [id number] Date of Birth: 15-Nov-1946

## 2018-10-20 NOTE — Patient Instructions (Signed)
  Strengthening: Resisted Flexion   Hold tubing with _____ arm(s) at side. Pull forward and up. Move shoulder through pain-free range of motion. Repeat __10__ times per set.  Do _1-2_ sessions per day , every other day   Strengthening: Resisted Extension   Hold tubing in __right___ hand(s), arm forward. Pull arm back, elbow straight. Repeat _15___ times per set. Do _1-2___ sessions per day, every other day.   Resisted Horizontal Abduction: Bilateral   Sit or stand, tubing in both hands, arms out in front. Keeping arms straight, pinch shoulder blades together and stretch arms out. Repeat _105__ times per set. Do _1-2___ sessions per day, every other day.Keep low   Elbow Flexion: Resisted   With tubing held in ______ hand(s) and other end secured under foot, curl arm up as far as possible. Repeat _15___ times per set. Do _1-2___ sessions per day, every other day.    Elbow Extension: Resisted   Sit in chair with resistive band and  hold with other hand) and __right_____ elbow bent. Straighten elbow. Repeat _15___ times per set.  Do _1-2___ sessions per day, every other day.   Copyright  VHI. All rights reserved.

## 2018-10-20 NOTE — Therapy (Signed)
[facility] [address] [place], [place], [address] Phone: [phone]   Fax:  [phone]  Occupational Therapy Treatment  Patient Details  Name: James Olsen MRN: [id number] Date of Birth: 30-Sep-1946 No data recorded  Encounter Date: 10/20/2018  OT End of Session - 10/20/18 1021    Visit Number  14    Number of Visits  25    Date for OT Re-Evaluation  12/06/18    Authorization Type  cert. 09/07/18-12/06/18    Authorization Time Period  Medicare & [employer], covered 100%    Authorization - Visit Number  70    Authorization - Number of Visits  20    OT Start Time  1019    OT Stop Time  1100    OT Time Calculation (min)  41 min    Activity Tolerance  Patient tolerated treatment well    Behavior During Therapy  WFL for tasks assessed/performed       Past Medical History:  Diagnosis Date  . Arthritis   . GERD (gastroesophageal reflux disease)   . Glaucoma   . Macular degeneration   . Wears glasses     Past Surgical History:  Procedure Laterality Date  . CHOLECYSTECTOMY  2008   lap choli  . COLONOSCOPY    . CRANIOTOMY N/A 08/18/2018   Procedure: CRANIOTOMY TUMOR EXCISION;  Surgeon: [name], MD;  Location: [facility];  Service: Neurosurgery;  Laterality: N/A;  CRANIOTOMY TUMOR EXCISION  . NAILBED REPAIR Left 05/04/2014   Procedure: LEFT INDEX NAIL ABLATION V-Y FLAP COVERAGE;  Surgeon: [name]., MD;  Location: [facility];  Service: Orthopedics;  Laterality: Left;  index  . PILONIDAL CYST EXCISION     x2  . TONSILLECTOMY    . VASECTOMY      There were no vitals filed for this visit.  Subjective Assessment - 10/20/18 1021    Pertinent History  Meningioma s/p craniotomy for tumor resection 08/18/18, Acute cerebral infarction; arthritis, macular degeneration, hepatitis, GERD    Patient Stated Goals  be back to normal or 95%    Currently in Pain?  No/denies             Treatment: Fine motor coordination activity placing purdue pegs into pegboard and removing with in hand manipulation, min v.c min-mod difficulty Arm bike x 5 mins level 3 for conditioning. Pt was instructed in yellow theraband HEP.                 OT Education - 10/20/18 1058    Education Details  yellow theraband HEP low range, see pt instuctions.    Person(s) Educated  Patient    Methods  Explanation;Demonstration    Comprehension  Verbalized understanding;Returned demonstration;Verbal cues required       OT Short Term Goals - 09/28/18 0753      OT SHORT TERM GOAL #1   Title  Pt will be independent with initial HEP.--check STG 10/22/18    Time  6    Period  Weeks    Status  Achieved      OT SHORT TERM GOAL #2   Title  Pt will be able to bathe mod I.    Time  6    Period  Weeks    Status  Achieved      OT SHORT TERM GOAL #3   Title  Pt will be able to complete clothing fasteners mod I.    Time  6    Period  Weeks    Status  Achieved   09/28/18:  incr time     OT SHORT TERM GOAL #4   Title  Pt will be able to cut food mod I.    Time  6    Period  Weeks    Status  Achieved   09/28/18:  able to perform with fork/knife but no chopping/peeling     OT SHORT TERM GOAL #5   Title  Pt will demo at least 60* R shoulder flex with only min compensation for improved functional reach.    Baseline  45* with mod compensation    Period  Weeks    Status  Achieved   09/28/18:  WNL     OT SHORT TERM GOAL #6   Title  Pt will be able to use RUE as stabilizer consistently for ADLs.    Time  6    Period  Weeks    Status  Achieved        OT Long Term Goals - 10/20/18 1022      OT LONG TERM GOAL #1   Title  Pt will be independent with updated HEP.--check LTGs 12/06/18    Status  On-going      OT LONG TERM GOAL #2   Title  Pt will perform simple cooking tasks mod I.--09/28/18 upgraded to mod complex cooking involving carrying pots/pans and chopping/peeling with RUE       OT LONG TERM GOAL #3   Title  Pt will perform mod complex home maintenance tasks mod I.    Status  On-going      OT LONG TERM GOAL #4   Title  Pt will use RUE as nondominant assist at least 75% of the time for ADLs.---upgraded 09/28/18:  will use RUE 100% of the time for previous ADL/IADL tasks.              Patient will benefit from skilled therapeutic intervention in order to improve the following deficits and impairments:     Visit Diagnosis: Hemiplegia and hemiparesis following cerebral infarction affecting right non-dominant side (HCC)  Other lack of coordination  Abnormal posture  Muscle weakness (generalized)    Problem List Patient Active Problem List   Diagnosis Date Noted  . Other abnormalities of gait and mobility 09/17/2018  . Acute cerebral infarction ([facility])   . Thrombocytopenia ([facility])   . 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 Acute blood loss anemia   . Generalized OA   . Gastroesophageal reflux disease   . Seizure prophylaxis   . Leukocytosis   . Tumor 08/18/2018  . 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 MACULAR DEGENERATION 02/29/2008  . ALLERGY 02/29/2008  . Nonspecific (abnormal) findings on radiological and other examination of body structure 12/26/2007  . NONSPECIFIC ABNORM FIND RAD&OTH EXAM LUNG FIELD 12/26/2007  . GALLSTONES 10/01/2007  . ELEVATION, TRANSAMINASE/LDH LEVELS 09/06/2007  . HEPATITIS NOS 09/01/2007  . ABDOMINAL PAIN, EPIGASTRIC 09/01/2007    Ivie Savitt 10/20/2018, 10:58 AM  [facility] [address] [place], [place], [address] Phone: [phone]   Fax:  [phone]  Name: EYDEN DOBIE MRN: [id number] Date of Birth: January 15, 1946

## 2018-10-20 NOTE — Patient Instructions (Signed)
  Please complete the assigned speech therapy homework prior to your next session and return it to the speech therapist at your next visit.  

## 2018-10-20 NOTE — Therapy (Signed)
[facility] [address] [place], [place], [address] Phone: [phone]   Fax:  [phone]  Physical Therapy Treatment & 10th visit Progress Note   Patient Details  Name: James Olsen MRN: [id number] Date of Birth: [birth year] Referring Provider (PT): Dr. Alger Simons   Encounter Date: 10/20/2018  PT End of Session - 10/20/18 1055    Visit Number  10    Number of Visits  16    Date for PT Re-Evaluation  11/06/18    Authorization Type  Medicare Part A&B- Medicare progress note every 10th visit    Authorization Time Period  Follow medicare guidelines     PT Start Time  0930    PT Stop Time  1015    PT Time Calculation (min)  45 min    Equipment Utilized During Treatment  Gait belt    Activity Tolerance  Patient tolerated treatment well    Behavior During Therapy  [facility] for tasks assessed/performed       Past Medical History:  Diagnosis Date  . Arthritis   . GERD (gastroesophageal reflux disease)   . Glaucoma   . Macular degeneration   . Wears glasses     Past Surgical History:  Procedure Laterality Date  . CHOLECYSTECTOMY  2008   lap choli  . COLONOSCOPY    . CRANIOTOMY N/A 08/18/2018   Procedure: CRANIOTOMY TUMOR EXCISION;  Surgeon: [name], MD;  Location: [facility];  Service: Neurosurgery;  Laterality: N/A;  CRANIOTOMY TUMOR EXCISION  . NAILBED REPAIR Left 05/04/2014   Procedure: LEFT INDEX NAIL ABLATION V-Y FLAP COVERAGE;  Surgeon: [name]., MD;  Location: [facility];  Service: Orthopedics;  Laterality: Left;  index  . PILONIDAL CYST EXCISION     x2  . TONSILLECTOMY    . VASECTOMY      There were no vitals filed for this visit.  Subjective Assessment - 10/20/18 0934    Subjective  Pt reports everything has been going well and he is looking forward to his surgery on 11/8. Reports exercises are going well at home.     Pertinent History  OA, GERD, Macular degeneration,  Bilateral LE numbness with onset earlier this year 2/2 left frontal meningioma     Limitations  Walking;Lifting    Patient Stated Goals  Improve motor skills and functional mobility     Currently in Pain?  No/denies         [facility] Adult PT Treatment/Exercise - 10/20/18 1049      High Level Balance   High Level Balance Activities  Other (comment)    High Level Balance Comments  Pt performs dynamic standing balance activity in SLS with contralateral LE performing cone tapping in dynamic planes. Pt progresses from single tapping each cone to double tapping each cone to increase stance limb duration for increased glut med activation. Pt demonstrates increased challenge performing SLS on RLE>L LE. Pt progresses to standing on foam while performing forward cone tapping. Pt demonstrates increased challenge performing activity on compliant surface requiring CGA from therapist for safety.      Exercises   Exercises  Knee/Hip    Other Exercises   Pt performs exercise in 1/2 kneeling performing push outs with 2# ball for x10 reps leading with each LE. Pt progresses to performing 1x10 reps of diagonal "chops" leading with each LE for increased challenge with core stabilization with dynamic UE movements. Therapist provides multimodal cues for posture and to  perform hip extension with trailing LE. Pt then progresses to hip extension with band resistance in tall kneeling position for 1x10 reps. Pt progresses to performing hip extension with band resistance while performing push outs with weighted ball for increased challenge. Therapist cues for proper technique.              PT Education - 10/20/18 1054    Education Details  Therapist provides education regarding POC moving forward with physical therapy s/p surgery on 11/8.     Person(s) Educated  Patient    Methods  Explanation    Comprehension  Verbalized understanding       PT Short Term Goals - 10/07/18 0941      PT SHORT TERM GOAL #1    Title  Pt will demonstrate independence and compliancy with HEP to further progress towards improving strength and functional mobility     Baseline  Dependent     Time  4    Period  Weeks    Status  Achieved      PT SHORT TERM GOAL #2   Title  Pt will improve Berg Balance Assessment by 3 points indicating minimal detectable change and decreased fall risk    Baseline  10/17: 54/56     Time  4    Period  Weeks    Status  Achieved      PT SHORT TERM GOAL #3   Title  Pt will improve gait speed to 4.08 ft/sec using LRAD and supervision to improve functional mobility     Baseline  10/17: 4.33 ft/sec indicative of normal walking speed     Time  4    Period  Weeks    Status  Achieved      PT SHORT TERM GOAL #4   Title  Pt will improve FGA score by 4 points to increase dynamic standing during functional gait using no AD    Baseline  10/17: 23/30     Time  4    Period  Weeks    Status  Achieved      PT SHORT TERM GOAL #5   Title  Pt will ambulate 300 feet with LRAD ambulating over uneven surfaces, curbs, ramps, and demonstrate ability to vary gait speed to improve community accessibility safely     Baseline  10/17: Patient achieves goal with therapist providing supervision for safety considerations    Time  4    Period  Weeks    Status  Achieved        PT Long Term Goals - 10/07/18 0943      PT LONG TERM GOAL #1   Title  Pt will demonstrate independence with HEP to further progress towards increasing strength and functional mobility     Time  8    Period  Weeks    Status  New    Target Date  11/06/18      PT LONG TERM GOAL #2   Title  Pt will improve Berg Balance Score to >55/56 to decrease fall risk potential     Baseline  54/56    Time  8    Period  Weeks    Status  Revised    Target Date  11/06/18      PT LONG TERM GOAL #3   Title  Pt will improve FGA score to >27/30 to improve safety and balance during functional mobility with no AD and supervision     Baseline   23/30  Time  8    Period  Weeks    Status  Revised    Target Date  11/06/18      PT LONG TERM GOAL #4   Title  Pt will ambulate outdoors over uneven surfaces, curbs, ramps, inclines, and demonstrate ability to vary gait speed with LRAD and Mod I to improve safety with community accessibility     Time  8    Period  Weeks    Status  New    Target Date  11/06/18      PT LONG TERM GOAL #5   Title  Pt will negotiate 12 steps using no HR's and recirprocal stepping technique, mod I to increase safety and independence with community accessibility indicating improvement in LE strength    Time  8    Period  Weeks    Status  Revised    Target Date  11/06/18      PT LONG TERM GOAL #6   Title  Pt will improve gait speed to >/= to 4.37 ft/sec indicating normal walking speed with Mod I and LRAD to increase independence with functional mobility     Baseline  4.33 ft/sec at 4 weeks- goal met    Time  --    Period  --    Status  Deferred            Plan - 10/20/18 1055    Clinical Impression Statement  Today's skilled session focused on core stabilization exercises in 1/2 kneeling position, LE strengthening, and dynamic standing balance. Pt tolerated session well demonstrated by ability to tolerate increased resistance during strengthening and stabilization exercises with proper technique when cued. Pt demonstrates increased challenge performing standing balance exercises while on compliant surface. Pt will continue to benefit from skilled PT to address strengthening and high level balance deficits to improve independence with functional mobility.     Rehab Potential  Good    Clinical Impairments Affecting Rehab Potential  R sided weakness with difficulty negotiating obstacles and performing high level balance    PT Frequency  2x / week    PT Duration  8 weeks    PT Treatment/Interventions  ADLs/Self Care Home Management;Gait training;Neuromuscular re-education;Functional mobility  training;Stair training;Cognitive remediation;Energy conservation;Patient/family education;Therapeutic activities;Therapeutic exercise;Orthotic Fit/Training;Balance training;DME Instruction    PT Next Visit Plan  gait with head turns, tandem gait, retro gait, bosu ball lunges, tall kneeling exercises, core stabilization, high level balance, rocker board with cone tapping, foam with pertubations, bosu ball for hip strategy, ramp with compliant surface, step ups, eccentric quads, glut med strengthening, STS with L LE staggered, bridging, obstacle course/ compliant surface with items under, community ambulation, hurdles, SLS with cone tapping all around,     PT Home Exercise Plan  [id number] & [id number]    Consulted and Agree with Plan of Care  Patient       Patient will benefit from skilled therapeutic intervention in order to improve the following deficits and impairments:  Abnormal gait, Decreased cognition, Decreased knowledge of use of DME, Improper body mechanics, Decreased mobility, Postural dysfunction, Decreased strength, Impaired perceived functional ability, Impaired UE functional use, Decreased balance, Decreased safety awareness, Difficulty walking  Visit Diagnosis: Muscle weakness (generalized)  Abnormal posture  Other abnormalities of gait and mobility     Problem List Patient Active Problem List   Diagnosis Date Noted  . Other abnormalities of gait and mobility 09/17/2018  . Acute cerebral infarction ([facility])   . Thrombocytopenia ([facility])   . Hemiparesis  of right dominant side as late effect of cerebral infarction ([facility])   . Acute blood loss anemia   . Generalized OA   . Gastroesophageal reflux disease   . Seizure prophylaxis   . Leukocytosis   . Tumor 08/18/2018  . 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 MACULAR DEGENERATION 02/29/2008  . ALLERGY 02/29/2008  . Nonspecific (abnormal) findings on radiological and other examination of body structure 12/26/2007   . NONSPECIFIC ABNORM FIND RAD&OTH EXAM LUNG FIELD 12/26/2007  . GALLSTONES 10/01/2007  . ELEVATION, TRANSAMINASE/LDH LEVELS 09/06/2007  . HEPATITIS NOS 09/01/2007  . ABDOMINAL PAIN, EPIGASTRIC 09/01/2007   10th Visit Physical Therapy Progress Note  Dates of Reporting Period: 09/07/18 to 10/20/18   Objective Reports: See impression statement above.   Objective Measurements: See above.  Goal Update: See above.   Plan: Patient currently scheduled for 1 more skilled PT visit on 11/7 before his scheduled cranioplasty on 11/8. S/p surgery, patient is scheduled for 1 more skilled visit on 11/14 to follow up with patient to establish POC moving forward. Currently, patient has demonstrated significant improvement in LE strength, dynamic standing balance, and functional mobility. Therapist recommends to continue progressing patient with exercises as tolerated and finalize HEP s/p cranioplasty.   Reason Skilled Services are Required: To maximize patient's functional independence and decrease fall risk potential.   Floreen Comber, SPT 10/20/2018, 10:59 AM  [facility] [address] [place], [place], [address] Phone: [phone]   Fax:  [phone]  Name: James Olsen MRN: [id number] Date of Birth: [birth year]

## 2018-10-21 NOTE — Pre-Procedure Instructions (Signed)
ODES LOLLI  10/21/2018      [id number] [address], [place] [address] Phone: [phone] Fax: [phone]    Your procedure is scheduled on November 8th.  Report to [facility] Admitting at 0700 A.M.  Call this number if you have problems the morning of surgery:  [phone]   Remember:  Do not eat or drink after midnight.    Take these medicines the morning of surgery with A SIP OF WATER   levETIRAcetam (KEPPRA)  ranitidine (ZANTAC) if needed    Do not wear jewelry.  Do not wear lotions, powders, or colognes, or deodorant.  Men may shave face and neck.  Do not bring valuables to the hospital.  [facility] is not responsible for any belongings or valuables.  Contacts, dentures or bridgework may not be worn into surgery.  Leave your suitcase in the car.  After surgery it may be brought to your room.  For patients admitted to the hospital, discharge time will be determined by your treatment team.  Patients discharged the day of surgery will not be allowed to drive home.    [facility]- Preparing For Surgery  Before surgery, you can play an important role. Because skin is not sterile, your skin needs to be as free of germs as possible. You can reduce the number of germs on your skin by washing with CHG (chlorahexidine gluconate) Soap before surgery.  CHG is an antiseptic cleaner which kills germs and bonds with the skin to continue killing germs even after washing.    Oral Hygiene is also important to reduce your risk of infection.  Remember - BRUSH YOUR TEETH THE MORNING OF SURGERY WITH YOUR REGULAR TOOTHPASTE  Please do not use if you have an allergy to CHG or antibacterial soaps. If your skin becomes reddened/irritated stop using the CHG.  Do not shave (including legs and underarms) for at least 48 hours prior to first CHG shower. It is OK to shave your face.  Please follow these  instructions carefully.   1. Shower the NIGHT BEFORE SURGERY and the MORNING OF SURGERY with CHG.   2. If you chose to wash your hair, wash your hair first as usual with your normal shampoo.  3. After you shampoo, rinse your hair and body thoroughly to remove the shampoo.  4. Use CHG as you would any other liquid soap. You can apply CHG directly to the skin and wash gently with a scrungie or a clean washcloth.   5. Apply the CHG Soap to your body ONLY FROM THE NECK DOWN.  Do not use on open wounds or open sores. Avoid contact with your eyes, ears, mouth and genitals (private parts). Wash Face and genitals (private parts)  with your normal soap.  6. Wash thoroughly, paying special attention to the area where your surgery will be performed.  7. Thoroughly rinse your body with warm water from the neck down.  8. DO NOT shower/wash with your normal soap after using and rinsing off the CHG Soap.  9. Pat yourself dry with a CLEAN TOWEL.  10. Wear CLEAN PAJAMAS to bed the night before surgery, wear comfortable clothes the morning of surgery  11. Place CLEAN SHEETS on your bed the night of your first shower and DO NOT SLEEP WITH PETS.    Day of Surgery:  Do not apply any deodorants/lotions.  Please wear clean clothes to the hospital/surgery center.  Remember to brush your teeth WITH YOUR REGULAR TOOTHPASTE.    Please read over the following fact sheets that you were given.

## 2018-10-22 ENCOUNTER — Encounter (HOSPITAL_COMMUNITY)
Admission: RE | Admit: 2018-10-22 | Discharge: 2018-10-22 | Disposition: A | Discharge status: Home / Self Care | Payer: Medicare Other | Source: Ambulatory Visit | Attending: Neurosurgery | Admitting: Neurosurgery

## 2018-10-22 ENCOUNTER — Other Ambulatory Visit: Payer: Self-pay

## 2018-10-22 ENCOUNTER — Encounter (HOSPITAL_COMMUNITY): Payer: Self-pay

## 2018-10-22 DIAGNOSIS — D329 Benign neoplasm of meninges, unspecified: Secondary | ICD-10-CM | POA: Insufficient documentation

## 2018-10-22 DIAGNOSIS — K219 Gastro-esophageal reflux disease without esophagitis: Secondary | ICD-10-CM | POA: Diagnosis not present

## 2018-10-22 DIAGNOSIS — Z01818 Encounter for other preprocedural examination: Secondary | ICD-10-CM | POA: Insufficient documentation

## 2018-10-22 DIAGNOSIS — H3530 Unspecified macular degeneration: Secondary | ICD-10-CM | POA: Insufficient documentation

## 2018-10-22 DIAGNOSIS — I69320 Aphasia following cerebral infarction: Secondary | ICD-10-CM | POA: Insufficient documentation

## 2018-10-22 DIAGNOSIS — I69351 Hemiplegia and hemiparesis following cerebral infarction affecting right dominant side: Secondary | ICD-10-CM | POA: Insufficient documentation

## 2018-10-22 DIAGNOSIS — Z79899 Other long term (current) drug therapy: Secondary | ICD-10-CM | POA: Diagnosis not present

## 2018-10-22 HISTORY — DX: Personal history of other medical treatment: Z92.89

## 2018-10-22 HISTORY — DX: Cerebral infarction, unspecified: I63.9

## 2018-10-22 LAB — CBC
HCT: 43.5 % (ref 39.0–52.0)
Hemoglobin: 13.5 g/dL (ref 13.0–17.0)
MCH: 28.9 pg (ref 26.0–34.0)
MCHC: 31 g/dL (ref 30.0–36.0)
MCV: 93.1 fL (ref 80.0–100.0)
Platelets: 171 10*3/uL (ref 150–400)
RBC: 4.67 MIL/uL (ref 4.22–5.81)
RDW: 13.1 % (ref 11.5–15.5)
WBC: 6.3 10*3/uL (ref 4.0–10.5)
nRBC: 0 % (ref 0.0–0.2)

## 2018-10-22 NOTE — Progress Notes (Signed)
PCP  James Eriksson  MD  Pt. Had stroke in August 2019 after craniotomy  Still has some residual weakness in left arm.  Will need to do type and Screen on DOS,pt.Marland Kitchen Received blood after surgery in August.  Denies any history of cardiac testing.

## 2018-10-22 NOTE — Pre-Procedure Instructions (Signed)
James Olsen  10/22/2018      [id number] [address], [place] [address] Phone: [phone] Fax: [phone]    Your procedure is scheduled on November 8th.  Report to [facility] Admitting at 0700 A.M.  Call this number if you have problems the morning of surgery:  [phone]   Remember:  Do not eat or drink after midnight.    Take these medicines the morning of surgery with A SIP OF WATER   levETIRAcetam (KEPPRA)  ranitidine (ZANTAC) if needed    Do not wear jewelry.  Do not wear lotions, powders, or colognes, or deodorant.  Men may shave face and neck.  Do not bring valuables to the hospital.  [facility] is not responsible for any belongings or valuables.  Contacts, dentures or bridgework may not be worn into surgery.  Leave your suitcase in the car.  After surgery it may be brought to your room.  For patients admitted to the hospital, discharge time will be determined by your treatment team.  Patients discharged the day of surgery will not be allowed to drive home.    [facility]- Preparing For Surgery  Before surgery, you can play an important role. Because skin is not sterile, your skin needs to be as free of germs as possible. You can reduce the number of germs on your skin by washing with CHG (chlorahexidine gluconate) Soap before surgery.  CHG is an antiseptic cleaner which kills germs and bonds with the skin to continue killing germs even after washing.    Oral Hygiene is also important to reduce your risk of infection.  Remember - BRUSH YOUR TEETH THE MORNING OF SURGERY WITH YOUR REGULAR TOOTHPASTE  Please do not use if you have an allergy to CHG or antibacterial soaps. If your skin becomes reddened/irritated stop using the CHG.  Do not shave (including legs and underarms) for at least 48 hours prior to first CHG shower. It is OK to shave your face.  Please follow these  instructions carefully.   1. Shower the NIGHT BEFORE SURGERY and the MORNING OF SURGERY with CHG.   2. If you chose to wash your hair, wash your hair first as usual with your normal shampoo.  3. After you shampoo, rinse your hair and body thoroughly to remove the shampoo.  4. Use CHG as you would any other liquid soap. You can apply CHG directly to the skin and wash gently with a scrungie or a clean washcloth.   5. Apply the CHG Soap to your body ONLY FROM THE NECK DOWN.  Do not use on open wounds or open sores. Avoid contact with your eyes, ears, mouth and genitals (private parts). Wash Face and genitals (private parts)  with your normal soap.  6. Wash thoroughly, paying special attention to the area where your surgery will be performed.  7. Thoroughly rinse your body with warm water from the neck down.  8. DO NOT shower/wash with your normal soap after using and rinsing off the CHG Soap.  9. Pat yourself dry with a CLEAN TOWEL.  10. Wear CLEAN PAJAMAS to bed the night before surgery, wear comfortable clothes the morning of surgery  11. Place CLEAN SHEETS on your bed the night of your first shower and DO NOT SLEEP WITH PETS.    Day of Surgery:  Do not apply any deodorants/lotions.  Please wear clean clothes to the hospital/surgery center.  Remember to brush your teeth WITH YOUR REGULAR TOOTHPASTE.    Please read over the following fact sheets that you were given. Pain Pamphlet Surgical site infection

## 2018-10-25 ENCOUNTER — Ambulatory Visit: Payer: Medicare Other | Admitting: Physical Therapy

## 2018-10-25 ENCOUNTER — Encounter: Payer: Self-pay | Admitting: Occupational Therapy

## 2018-10-25 ENCOUNTER — Ambulatory Visit: Payer: Medicare Other | Admitting: Speech Pathology

## 2018-10-25 ENCOUNTER — Ambulatory Visit: Payer: Medicare Other | Attending: Physical Medicine & Rehabilitation | Admitting: Occupational Therapy

## 2018-10-25 DIAGNOSIS — I69353 Hemiplegia and hemiparesis following cerebral infarction affecting right non-dominant side: Secondary | ICD-10-CM | POA: Diagnosis present

## 2018-10-25 DIAGNOSIS — R4701 Aphasia: Secondary | ICD-10-CM | POA: Insufficient documentation

## 2018-10-25 DIAGNOSIS — M6281 Muscle weakness (generalized): Secondary | ICD-10-CM | POA: Diagnosis present

## 2018-10-25 DIAGNOSIS — R293 Abnormal posture: Secondary | ICD-10-CM | POA: Diagnosis present

## 2018-10-25 DIAGNOSIS — R2681 Unsteadiness on feet: Secondary | ICD-10-CM | POA: Insufficient documentation

## 2018-10-25 DIAGNOSIS — R278 Other lack of coordination: Secondary | ICD-10-CM

## 2018-10-25 DIAGNOSIS — R41841 Cognitive communication deficit: Secondary | ICD-10-CM | POA: Diagnosis present

## 2018-10-25 DIAGNOSIS — R2689 Other abnormalities of gait and mobility: Secondary | ICD-10-CM

## 2018-10-25 NOTE — Therapy (Signed)
[facility] [address] [place], [place], [address] Phone: [phone]   Fax:  [phone]  Speech Language Pathology Treatment  Patient Details  Name: James Olsen MRN: [id number] Date of Birth: 1946/08/22 Referring Provider (SLP): Alger Simons MD   Encounter Date: 10/25/2018  End of Session - 10/25/18 1052    Visit Number  16    Number of Visits  17    Date for SLP Re-Evaluation  12/03/18    SLP Start Time  0846    SLP Stop Time   0928    SLP Time Calculation (min)  42 min    Activity Tolerance  Patient tolerated treatment well       Past Medical History:  Diagnosis Date  . Arthritis   . GERD (gastroesophageal reflux disease)   . Glaucoma   . History of blood transfusion     5 units after previous surgery  . Macular degeneration   . Stroke [facility])    after surgery in August 2019 mostly recovered still some weakness in left arm  . Wears glasses     Past Surgical History:  Procedure Laterality Date  . CHOLECYSTECTOMY  2008   lap choli  . COLONOSCOPY    . CRANIOTOMY N/A 08/18/2018   Procedure: CRANIOTOMY TUMOR EXCISION;  Surgeon: [name], MD;  Location: [facility];  Service: Neurosurgery;  Laterality: N/A;  CRANIOTOMY TUMOR EXCISION  . NAILBED REPAIR Left 05/04/2014   Procedure: LEFT INDEX NAIL ABLATION V-Y FLAP COVERAGE;  Surgeon: [name]., MD;  Location: [facility];  Service: Orthopedics;  Laterality: Left;  index  . PILONIDAL CYST EXCISION     x2  . TONSILLECTOMY    . VASECTOMY      There were no vitals filed for this visit.  Subjective Assessment - 10/25/18 0849    Subjective  "I'm going in the hospital on Friday."    Currently in Pain?  No/denies            ADULT SLP TREATMENT - 10/25/18 0846      General Information   Behavior/Cognition  Alert;Cooperative;Pleasant mood      Treatment Provided   Treatment provided  Cognitive-Linquistic      Pain  Assessment   Pain Assessment  No/denies pain      Cognitive-Linquistic Treatment   Treatment focused on  Cognition    Skilled Treatment  SLP targeted pt's attention to detail and alternating attention between 2 moderately complex tasks. Pt maintained alternating attention for 15 minutes (extended time req for all tasks), accuracy 93%; pt required cues to ID errrors which occurred due to decreased attention to detail. Pt with reduced emergent awareness of errors in home assignments (1/2); pt ID'd and corrected with extended time. Mod complex conversation (12 minutes) was functional.      Assessment / Recommendations / Plan   Plan  Continue with current plan of care      Progression Toward Goals   Progression toward goals  Progressing toward goals         SLP Short Term Goals - 10/25/18 1053      SLP SHORT TERM GOAL #1   Title  pt will provide sentence responses 18/20 with WFL speech fluidity over three sessions    Status  Achieved      SLP SHORT TERM GOAL #2   Title  pt will undergo formal cognitive linguistic assessment by visit number 3    Status  Achieved      SLP SHORT TERM GOAL #3   Title  using compensatory strategies pt will functionally participate in 10 minutes simple conversation over 3 sessions    Status  Achieved       SLP Long Term Goals - 10/25/18 1053      SLP LONG TERM GOAL #1   Title  using compensatory strategies pt will functionally participate in 10 minutes mod complex conversation over 3 sessions    Baseline  10-18-18, 10-20-18, 10-25-18    Time  1    Period  Weeks    Status  Achieved      SLP LONG TERM GOAL #2   Title  pt will functionally perform complex naming tasks with use of compensations over 2 sessions    Baseline  09/09/18    Status  Achieved      SLP LONG TERM GOAL #3   Title  pt will demo WNL emergent awareness with mod complex tasks in order to acheive 100% success over three sessions    Time  1    Period  Weeks    Status  On-going        Plan - 10/25/18 1053    Clinical Impression Statement  Pt's language in 10 minutes mod complex conversation today was WFL/WNL. Pt cont'd to demo high level attentional/emergent awareness impairments today in simple but detailed written tasks, extra time allowed consistently. Continue skilled ST to maximize communication and higher level cognition for independence and QOL.     Speech Therapy Frequency  2x / week    Duration  --   8 weeks or 17 sessions   Treatment/Interventions  SLP instruction and feedback;Compensatory strategies;Functional tasks;Cognitive reorganization;Internal/external aids;Patient/family education;Multimodal communcation approach;Environmental controls;Language facilitation;Cueing hierarchy    Potential to Achieve Goals  Good    Potential Considerations  Severity of impairments       Patient will benefit from skilled therapeutic intervention in order to improve the following deficits and impairments:   Cognitive communication deficit  Aphasia    Problem List Patient Active Problem List   Diagnosis Date Noted  . Other abnormalities of gait and mobility 09/17/2018  . Acute cerebral infarction ([facility])   . Thrombocytopenia ([facility])   . 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 Acute blood loss anemia   . Generalized OA   . Gastroesophageal reflux disease   . Seizure prophylaxis   . Leukocytosis   . Tumor 08/18/2018  . 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 MACULAR DEGENERATION 02/29/2008  . ALLERGY 02/29/2008  . Nonspecific (abnormal) findings on radiological and other examination of body structure 12/26/2007  . NONSPECIFIC ABNORM FIND RAD&OTH EXAM LUNG FIELD 12/26/2007  . GALLSTONES 10/01/2007  . ELEVATION, TRANSAMINASE/LDH LEVELS 09/06/2007  . HEPATITIS NOS 09/01/2007  . ABDOMINAL PAIN, EPIGASTRIC 09/01/2007   Deneise Lever, [place], [place] Speech-Language Pathologist  Aliene Altes 10/25/2018, 10:56 AM  [facility] [address] [place], [place], [address] Phone: [phone]   Fax:  [phone]   Name: James Olsen MRN: [id number] Date of Birth: October 19, 1946

## 2018-10-25 NOTE — Therapy (Signed)
[facility] [address] [place], [place], [address] Phone: [phone]   Fax:  [phone]  Occupational Therapy Treatment  Patient Details  Name: James Olsen MRN: [id number] Date of Birth: Jan 06, 1946 No data recorded  Encounter Date: 10/25/2018  OT End of Session - 10/25/18 0937    Visit Number  15    Number of Visits  25    Date for OT Re-Evaluation  12/06/18    Authorization Type  cert. 09/07/18-12/06/18    Authorization Time Period  Medicare & [employer], covered 100%    Authorization - Visit Number  15    Authorization - Number of Visits  20    OT Start Time  3149    OT Stop Time  1015    OT Time Calculation (min)  40 min    Activity Tolerance  Patient tolerated treatment well    Behavior During Therapy  WFL for tasks assessed/performed       Past Medical History:  Diagnosis Date  . Arthritis   . GERD (gastroesophageal reflux disease)   . Glaucoma   . History of blood transfusion     5 units after previous surgery  . Macular degeneration   . Stroke [facility])    after surgery in August 2019 mostly recovered still some weakness in left arm  . Wears glasses    + Past Surgical History:  Procedure Laterality Date  . CHOLECYSTECTOMY  2008   lap choli  . COLONOSCOPY    . CRANIOTOMY N/A 08/18/2018   Procedure: CRANIOTOMY TUMOR EXCISION;  Surgeon: [name], MD;  Location: [facility];  Service: Neurosurgery;  Laterality: N/A;  CRANIOTOMY TUMOR EXCISION  . NAILBED REPAIR Left 05/04/2014   Procedure: LEFT INDEX NAIL ABLATION V-Y FLAP COVERAGE;  Surgeon: [name]., MD;  Location: [facility];  Service: Orthopedics;  Laterality: Left;  index  . PILONIDAL CYST EXCISION     x2  . TONSILLECTOMY    . VASECTOMY      There were no vitals filed for this visit.  Subjective Assessment - 10/25/18 0936    Subjective   had bee sting on R hand (swelling/redness), went in glove    Pertinent History   Meningioma s/p craniotomy for tumor resection 08/18/18, Acute cerebral infarction; arthritis, macular degeneration, hepatitis, GERD    Patient Stated Goals  be back to normal or 95%    Currently in Pain?  No/denies         Completing Purdue pegboard.  Initially one piece at a time, then progressed to using in-hand manipulation with mod-max difficulty.  Picking up blocks with gripper set on level 2 (black spring) for sustained grip strength with mod difficulty.  Reviewed yellow theraband HEP.  Pt returned demo each with occasional min v.c.  Discussed progress towards goals--see LTG section below.    OT Short Term Goals - 09/28/18 0753      OT SHORT TERM GOAL #1   Title  Pt will be independent with initial HEP.--check STG 10/22/18    Time  6    Period  Weeks    Status  Achieved      OT SHORT TERM GOAL #2   Title  Pt will be able to bathe mod I.    Time  6    Period  Weeks    Status  Achieved      OT SHORT TERM GOAL #3   Title  Pt will be able  to complete clothing fasteners mod I.    Time  6    Period  Weeks    Status  Achieved   09/28/18:  incr time     OT SHORT TERM GOAL #4   Title  Pt will be able to cut food mod I.    Time  6    Period  Weeks    Status  Achieved   09/28/18:  able to perform with fork/knife but no chopping/peeling     OT SHORT TERM GOAL #5   Title  Pt will demo at least 60* R shoulder flex with only min compensation for improved functional reach.    Baseline  45* with mod compensation    Period  Weeks    Status  Achieved   09/28/18:  WNL     OT SHORT TERM GOAL #6   Title  Pt will be able to use RUE as stabilizer consistently for ADLs.    Time  6    Period  Weeks    Status  Achieved        OT Long Term Goals - 10/25/18 0938      OT LONG TERM GOAL #1   Title  Pt will be independent with updated HEP.--check LTGs 12/06/18    Time  12    Period  Weeks    Status  On-going      OT LONG TERM GOAL #2   Title  Pt will perform simple cooking  tasks mod I.--09/28/18 upgraded to mod complex cooking involving carrying pots/pans and chopping/peeling with RUE    Time  12    Period  Weeks    Status  Revised   09/28/18:  met initial goal and upgraded     OT LONG TERM GOAL #3   Title  Pt will perform mod complex home maintenance tasks mod I.    Time  12    Period  Weeks    Status  Achieved   10/25/18:  yardwork     OT LONG TERM GOAL #4   Title  Pt will use RUE as nondominant assist at least 75% of the time for ADLs.---upgraded 09/28/18:  will use RUE 100% of the time for previous ADL/IADL tasks.    Time  8    Period  Weeks    Status  Revised   09/28/18:  met initial goal and upgraded     OT LONG TERM GOAL #5   Title  Pt will demo at least 90* R shoulder flex for functional reach with min compensation.  09/28/18:  Pt will be able to retrieve 2-3lb object from overhead shelf with good control x3.      Period  Weeks    Status  Revised   09/28/18:  initial goal met and upgraded     OT LONG TERM GOAL #6   Title  Pt will be able to carry light bag of groceries with RUE.--09/28/18  upgrade to 5lb bag of groceries    Time  12    Period  Weeks    Status  Revised   09/28/18:  met initial goal and upgraded     OT LONG TERM GOAL #7   Title  Pt will demo improved coordination/RUE functional reach as shown by scoring at least 30 on box and blocks test.--09/28/18:  upgrade to 40 blocks    Time  12    Period  Weeks    Status  Achieved   09/28/18:  30  blocks.  10/25/18:  41 blocks     OT LONG TERM GOAL #8   Title  Pt will demo at least 35lbs R grip strength for opening containers/carrying objects.--09/28/18 upgrade to 45lbs     Baseline  22lbs    Time  12    Period  Weeks    Status  Revised   09/28/18:  34lbs     OT LONG TERM GOAL  #9   Baseline  Pt will improve coordination with R dominant UE as shown by completing 9-hole peg test in less than 60sec.  Revised:  10/25/18 to less than 45sec    Time  12    Period  Weeks    Status  Revised    Goal added 09/28/18 (baseline 102.28sec).  10/25/18:  Met & revised 58.60sec           Plan - 10/25/18 0942    Clinical Impression Statement  Pt reports performng yardwork over the weekend and continues to progress with IADL performance and improving functional reach/coordination.  Pt met 3 LTGs (1 updated).      Occupational Profile and client history currently impacting functional performance  Pt was very independent and active prior to surgery.  Pt drove, performed yardwork, most cooking/home maintenance tasks, and enjoyed outdoor leisure activities.  Pt is currently not able to drive, perform yardwork, perform previous cooking/home maintenance tasks or leisure tasks.  Pt's goal is to return to 90-100% of "normal"    Occupational performance deficits (Please refer to evaluation for details):  ADL's;IADL's;Social Participation;Leisure    Rehab Potential  Good    OT Frequency  2x / week    OT Duration  12 weeks    OT Treatment/Interventions  Self-care/ADL training;Cryotherapy;Paraffin;Therapeutic exercise;DME and/or AE instruction;Functional Mobility Training;Cognitive remediation/compensation;Balance training;Splinting;Manual Therapy;Neuromuscular education;Fluidtherapy;Ultrasound;Electrical Stimulation;Aquatic Therapy;Moist Heat;Energy conservation;Passive range of motion;Therapeutic activities;Patient/family education    Plan  continue with neuro re-ed RUE, RUE functional use, wt. bearing, coordination, upcoming surgery 10/29/18.      OT Home Exercise Plan  Education provided:  edema management, proper positioning of RUE, initial HEP with ball, red putty HEP, coordination HEP    Consulted and Agree with Plan of Care  Patient       Patient will benefit from skilled therapeutic intervention in order to improve the following deficits and impairments:  Abnormal gait, Decreased balance, Decreased endurance, Decreased mobility, Decreased range of motion, Decreased cognition, Decreased activity  tolerance, Decreased coordination, Decreased knowledge of use of DME, Decreased strength, Impaired flexibility, Impaired UE functional use  Visit Diagnosis: Hemiplegia and hemiparesis following cerebral infarction affecting right non-dominant side (HCC)  Other lack of coordination  Abnormal posture  Other abnormalities of gait and mobility  Muscle weakness (generalized)  Unsteadiness on feet    Problem List Patient Active Problem List   Diagnosis Date Noted  . Other abnormalities of gait and mobility 09/17/2018  . Acute cerebral infarction ([place])   . Thrombocytopenia ([facility])   . 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 Acute blood loss anemia   . Generalized OA   . Gastroesophageal reflux disease   . Seizure prophylaxis   . Leukocytosis   . Tumor 08/18/2018  . 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 MACULAR DEGENERATION 02/29/2008  . ALLERGY 02/29/2008  . Nonspecific (abnormal) findings on radiological and other examination of body structure 12/26/2007  . NONSPECIFIC ABNORM FIND RAD&OTH EXAM LUNG FIELD 12/26/2007  . GALLSTONES 10/01/2007  .  ELEVATION, TRANSAMINASE/LDH LEVELS 09/06/2007  . HEPATITIS NOS 09/01/2007  . ABDOMINAL PAIN, EPIGASTRIC 09/01/2007    [facility] 10/25/2018, 10:27 AM  [facility] [address] [place], [place], [address] Phone: [phone]   Fax:  [phone]  Name: James Olsen MRN: [id number] Date of Birth: 05-31-1946   Vianne Bulls, OTR/L [facility] [address] [place], [place]  [address] [phone] phone [phone] 10/25/18 10:27 AM

## 2018-10-25 NOTE — Anesthesia Preprocedure Evaluation (Addendum)
Anesthesia Evaluation  Patient identified by MRN, date of birth, ID band Patient awake    Reviewed: Allergy & Precautions, NPO status , Patient's Chart, lab work & pertinent test results  History of Anesthesia Complications (+) DIFFICULT AIRWAY and history of anesthetic complications  Airway Mallampati: III  TM Distance: >3 FB Neck ROM: Full    Dental no notable dental hx.    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 CVA, Residual Symptoms negative psych ROS   GI/Hepatic Neg liver ROS, GERD  Medicated,  Endo/Other  negative endocrine ROS  Renal/GU negative Renal ROS  negative genitourinary   Musculoskeletal negative musculoskeletal ROS (+)   Abdominal   Peds  Hematology negative hematology ROS (+)   Anesthesia Other Findings   Reproductive/Obstetrics                           Anesthesia Physical Anesthesia Plan  ASA: III  Anesthesia Plan: General   Post-op Pain Management:    Induction: Intravenous  PONV Risk Score and Plan: 2 and Ondansetron, Dexamethasone and Treatment may vary due to age or medical condition  Airway Management Planned: Oral ETT and Video Laryngoscope Planned  Additional Equipment: None  Intra-op Plan:   Post-operative Plan:   Informed Consent: I have reviewed the patients History and Physical, chart, labs and discussed the procedure including the risks, benefits and alternatives for the proposed anesthesia with the patient or authorized representative who has indicated his/her understanding and acceptance.     Plan Discussed with:   Anesthesia Plan Comments: (Per records patient was an easy mask ventilation but difficult intubation by direct laryngoscopy. Plan for  Glidescope intubation.)      Anesthesia Quick Evaluation

## 2018-10-25 NOTE — Progress Notes (Signed)
Anesthesia Chart Review:  Case:  [id number] Date/Time:  10/29/18 0845   Procedure:  CRANIOPLASTY (N/A ) - CRANIOPLASTY   Anesthesia type:  General   Pre-op diagnosis:  Meningioma   Location:  [facility] OR ROOM 18 / [facility]   Surgeon:  [name], MD      DISCUSSION: Patient is a 72 year old male scheduled for the above procedure.   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 Post-operatively he was noted to have right sided weakness and expressive aphasia. MRI showed small focus of acute ischemia at the inferior cavity margin extending to the ventricle and slight herniation of left middle frontal gyrus through the craniectomy defect. He was started on steroid taper and seizure prophylaxis (Keppra), and discharged to [facility] 94/19-9/14/19 for rehab and continues to receive out-patient therapies since discharge.  According to 08/18/18 anesthesia records, patient was an anticipated DIFFICULT AIRWAY due to limited oral opening, Difficult Airway- due to anterior larynx and Difficult Airway- due to reduced neck mobility Future Recommendations: Recommend- induction with short-acting agent, and alternative techniques readily available Comments: DLx1 by CRNA with miller 2, grade 3 view, DLx1 by Dr. Fransisco Beau with miller 2 unable to view VC, then glidescope 3 utilized, ett secured, BBS, +etco2, mouth as preop. 7.5 mm ETT inserted after three attempts Ventilation: Mask ventilation without difficulty and oral airway inserted  As above patient had CVA after 08/18/18 meningioma resection which was managed by neurosurgery and has had ongoing follow-up and therapies through PM&R which continued deficits but improvement in speech and right sided movement. Now neurosurgery is recommending cranioplasty. He remains on Keppra. Based on  currently available information, I would anticiapte that he can proceed as planned. He needs T&S on the day of surgery (due to transfusion 07/2018). Reviewed CVA history with anesthesiologist Oren Bracket, MD.   VS: BP (!) 129/57   Pulse 73   Temp 36.6 C   Resp 20   Ht 5\' 9"  (1.753 m)   Wt 90.3 kg   SpO2 100%   BMI 29.39 kg/m    PROVIDERS: [name], MD is PCP   LABS: Labs reviewed: Acceptable for surgery. For T&S on the day of surgery. (all labs ordered are listed, but only abnormal results are displayed)  Labs Reviewed  CBC    IMAGES: CT head 09/20/18: IMPRESSION: 1. Moderate edema about the meningioma resection site. No new area of low density to suggest interval infarct. 2. Residual tumor along the superior sagittal sinus and medial resection that is better demonstrated by MRI.  MRI Brain (post-08/18/18 craniotomy) 08/19/18: IMPRESSION: 1. Interval left superior frontal craniectomy and tumor resection. 2. Residual neoplasm at the superomedial aspect of the resection cavity along the falx and invading superior sagittal sinus measuring 3.2 x 2.1 x 1.7 cm (AP x ML x CC). 3. Small focus of acute ischemia at the inferior cavity margin extending to the ventricle. 4. Slight herniation of left middle frontal gyrus through the craniectomy defect. 5. Stable edema within the left superior frontal lobe white matter. Improved mass effect on the brain post resection.   EKG: 10/22/18: NSR   CV: N/A   Past Medical History:  Diagnosis Date  . Arthritis   . GERD (gastroesophageal reflux disease)   . Glaucoma   . History of blood transfusion     5 units  after previous surgery  . Macular degeneration   . Stroke [facility])    after surgery in August 2019 mostly recovered still some weakness in left arm  . Wears glasses     Past Surgical History:  Procedure Laterality Date  . CHOLECYSTECTOMY  2008   lap choli  . COLONOSCOPY    . CRANIOTOMY N/A 08/18/2018    Procedure: CRANIOTOMY TUMOR EXCISION;  Surgeon: [name], MD;  Location: [place];  Service: Neurosurgery;  Laterality: N/A;  CRANIOTOMY TUMOR EXCISION  . NAILBED REPAIR Left 05/04/2014   Procedure: LEFT INDEX NAIL ABLATION V-Y FLAP COVERAGE;  Surgeon: [name]., MD;  Location: [place];  Service: Orthopedics;  Laterality: Left;  index  . PILONIDAL CYST EXCISION     x2  . TONSILLECTOMY    . VASECTOMY      MEDICATIONS: . baclofen (LIORESAL) 10 MG tablet  . levETIRAcetam (KEPPRA) 500 MG tablet  . methocarbamol (ROBAXIN) 500 MG tablet  . Multiple Vitamins-Minerals (PRESERVISION AREDS 2) CAPS  . ranitidine (ZANTAC) 150 MG tablet   No current facility-administered medications for this encounter.     James Olsen [facility] Short Stay Center/Anesthesiology Phone [phone] 10/25/2018 1:17 PM

## 2018-10-27 ENCOUNTER — Other Ambulatory Visit: Payer: Self-pay | Admitting: Radiation Therapy

## 2018-10-28 ENCOUNTER — Ambulatory Visit: Payer: Medicare Other | Admitting: Occupational Therapy

## 2018-10-28 ENCOUNTER — Ambulatory Visit: Payer: Medicare Other | Admitting: Physical Therapy

## 2018-10-28 ENCOUNTER — Encounter: Payer: Self-pay | Admitting: Occupational Therapy

## 2018-10-28 ENCOUNTER — Ambulatory Visit: Payer: Medicare Other

## 2018-10-28 ENCOUNTER — Encounter: Payer: Self-pay | Admitting: Physical Therapy

## 2018-10-28 DIAGNOSIS — R2689 Other abnormalities of gait and mobility: Secondary | ICD-10-CM

## 2018-10-28 DIAGNOSIS — M6281 Muscle weakness (generalized): Secondary | ICD-10-CM

## 2018-10-28 DIAGNOSIS — R278 Other lack of coordination: Secondary | ICD-10-CM

## 2018-10-28 DIAGNOSIS — R293 Abnormal posture: Secondary | ICD-10-CM

## 2018-10-28 DIAGNOSIS — I69353 Hemiplegia and hemiparesis following cerebral infarction affecting right non-dominant side: Secondary | ICD-10-CM

## 2018-10-28 DIAGNOSIS — R2681 Unsteadiness on feet: Secondary | ICD-10-CM

## 2018-10-28 DIAGNOSIS — R4701 Aphasia: Secondary | ICD-10-CM

## 2018-10-28 DIAGNOSIS — R41841 Cognitive communication deficit: Secondary | ICD-10-CM

## 2018-10-28 NOTE — Therapy (Signed)
[facility] [address] [place], [place], [address] Phone: [phone]   Fax:  [phone]  Occupational Therapy Treatment  Patient Details  Name: James Olsen MRN: [id number] Date of Birth: 06/17/1946 No data recorded  Encounter Date: 10/28/2018  OT End of Session - 10/28/18 0826    Visit Number  16    Number of Visits  25    Date for OT Re-Evaluation  12/06/18    Authorization Type  cert. 09/07/18-12/06/18    Authorization Time Period  Medicare & [employer], covered 100%    Authorization - Visit Number  63    Authorization - Number of Visits  20    OT Start Time  [id number]    OT Stop Time  0915    OT Time Calculation (min)  43 min    Activity Tolerance  Patient tolerated treatment well    Behavior During Therapy  WFL for tasks assessed/performed       Past Medical History:  Diagnosis Date  . Arthritis   . GERD (gastroesophageal reflux disease)   . Glaucoma   . History of blood transfusion     5 units after previous surgery  . Macular degeneration   . Stroke [facility])    after surgery in August 2019 mostly recovered still some weakness in left arm  . Wears glasses     Past Surgical History:  Procedure Laterality Date  . CHOLECYSTECTOMY  2008   lap choli  . COLONOSCOPY    . CRANIOTOMY N/A 08/18/2018   Procedure: CRANIOTOMY TUMOR EXCISION;  Surgeon: [name], MD;  Location: [facility];  Service: Neurosurgery;  Laterality: N/A;  CRANIOTOMY TUMOR EXCISION  . NAILBED REPAIR Left 05/04/2014   Procedure: LEFT INDEX NAIL ABLATION V-Y FLAP COVERAGE;  Surgeon: [name]., MD;  Location: [facility];  Service: Orthopedics;  Laterality: Left;  index  . PILONIDAL CYST EXCISION     x2  . TONSILLECTOMY    . VASECTOMY      There were no vitals filed for this visit.  Subjective Assessment - 10/28/18 0826    Pertinent History  Meningioma s/p craniotomy for tumor resection 08/18/18, Acute cerebral  infarction; arthritis, macular degeneration, hepatitis, GERD    Patient Stated Goals  be back to normal or 95%    Currently in Pain?  No/denies       Reviewed need for written clearance to return to therapy after surgery.     Neuro re-ed: In sitting, BUEs closed-chain/AAROM shoulder flex and diagonals with ball with min v.c. for normal movement patterns.  In prone, scapular retraction with UEs in ext and abduction with min cues.  Wt. Bearing through elbows with head/chest lift for scapular depression with min cues.  Wt. Bearing on elbows in modified plank with min cues for incr scapular/core stability.   Therapeutic Activities: In sitting, functional reaching at mid-range to place small pegs in vertical pegboard to copy design with min difficulty with coordination and fatigue, 1-2 rest breaks and incr time.    Placing O'connor pegs in pegboard using tweezers with mod difficulty for incr coordination.       OT Short Term Goals - 09/28/18 0753      OT SHORT TERM GOAL #1   Title  Pt will be independent with initial HEP.--check STG 10/22/18    Time  6    Period  Weeks    Status  Achieved      OT SHORT  TERM GOAL #2   Title  Pt will be able to bathe mod I.    Time  6    Period  Weeks    Status  Achieved      OT SHORT TERM GOAL #3   Title  Pt will be able to complete clothing fasteners mod I.    Time  6    Period  Weeks    Status  Achieved   09/28/18:  incr time     OT SHORT TERM GOAL #4   Title  Pt will be able to cut food mod I.    Time  6    Period  Weeks    Status  Achieved   09/28/18:  able to perform with fork/knife but no chopping/peeling     OT SHORT TERM GOAL #5   Title  Pt will demo at least 60* R shoulder flex with only min compensation for improved functional reach.    Baseline  45* with mod compensation    Period  Weeks    Status  Achieved   09/28/18:  WNL     OT SHORT TERM GOAL #6   Title  Pt will be able to use RUE as stabilizer consistently for ADLs.     Time  6    Period  Weeks    Status  Achieved        OT Long Term Goals - 10/25/18 0938      OT LONG TERM GOAL #1   Title  Pt will be independent with updated HEP.--check LTGs 12/06/18    Time  12    Period  Weeks    Status  On-going      OT LONG TERM GOAL #2   Title  Pt will perform simple cooking tasks mod I.--09/28/18 upgraded to mod complex cooking involving carrying pots/pans and chopping/peeling with RUE    Time  12    Period  Weeks    Status  Revised   09/28/18:  met initial goal and upgraded     OT LONG TERM GOAL #3   Title  Pt will perform mod complex home maintenance tasks mod I.    Time  12    Period  Weeks    Status  Achieved   10/25/18:  yardwork     OT LONG TERM GOAL #4   Title  Pt will use RUE as nondominant assist at least 75% of the time for ADLs.---upgraded 09/28/18:  will use RUE 100% of the time for previous ADL/IADL tasks.    Time  8    Period  Weeks    Status  Revised   09/28/18:  met initial goal and upgraded     OT LONG TERM GOAL #5   Title  Pt will demo at least 90* R shoulder flex for functional reach with min compensation.  09/28/18:  Pt will be able to retrieve 2-3lb object from overhead shelf with good control x3.      Period  Weeks    Status  Revised   09/28/18:  initial goal met and upgraded     OT LONG TERM GOAL #6   Title  Pt will be able to carry light bag of groceries with RUE.--09/28/18  upgrade to 5lb bag of groceries    Time  12    Period  Weeks    Status  Revised   09/28/18:  met initial goal and upgraded     OT LONG TERM GOAL #7  Title  Pt will demo improved coordination/RUE functional reach as shown by scoring at least 30 on box and blocks test.--09/28/18:  upgrade to 40 blocks    Time  12    Period  Weeks    Status  Achieved   09/28/18:  30 blocks.  10/25/18:  41 blocks     OT LONG TERM GOAL #8   Title  Pt will demo at least 35lbs R grip strength for opening containers/carrying objects.--09/28/18 upgrade to 45lbs     Baseline   22lbs    Time  12    Period  Weeks    Status  Revised   09/28/18:  34lbs     OT LONG TERM GOAL  #9   Baseline  Pt will improve coordination with R dominant UE as shown by completing 9-hole peg test in less than 60sec.  Revised:  10/25/18 to less than 45sec    Time  12    Period  Weeks    Status  Revised   Goal added 09/28/18 (baseline 102.28sec).  10/25/18:  Met & revised 58.60sec           Plan - 10/28/18 0827    Clinical Impression Statement  Pt continues to make good progress with RUE functional use.  Pt is scheduled for surgery tomorrow to reconstruct skull.      Occupational Profile and client history currently impacting functional performance  Pt was very independent and active prior to surgery.  Pt drove, performed yardwork, most cooking/home maintenance tasks, and enjoyed outdoor leisure activities.  Pt is currently not able to drive, perform yardwork, perform previous cooking/home maintenance tasks or leisure tasks.  Pt's goal is to return to 90-100% of "normal"    Occupational performance deficits (Please refer to evaluation for details):  ADL's;IADL's;Social Participation;Leisure    Rehab Potential  Good    OT Frequency  2x / week    OT Duration  12 weeks    OT Treatment/Interventions  Self-care/ADL training;Cryotherapy;Paraffin;Therapeutic exercise;DME and/or AE instruction;Functional Mobility Training;Cognitive remediation/compensation;Balance training;Splinting;Manual Therapy;Neuromuscular education;Fluidtherapy;Ultrasound;Electrical Stimulation;Aquatic Therapy;Moist Heat;Energy conservation;Passive range of motion;Therapeutic activities;Patient/family education    Plan  upcoming surgery 10/29/18.  Pt to get clearance from physician to return to therapy.  reassess prn.    OT Home Exercise Plan  Education provided:  edema management, proper positioning of RUE, initial HEP with ball, red putty HEP, coordination HEP    Consulted and Agree with Plan of Care  Patient        Patient will benefit from skilled therapeutic intervention in order to improve the following deficits and impairments:  Abnormal gait, Decreased balance, Decreased endurance, Decreased mobility, Decreased range of motion, Decreased cognition, Decreased activity tolerance, Decreased coordination, Decreased knowledge of use of DME, Decreased strength, Impaired flexibility, Impaired UE functional use  Visit Diagnosis: Hemiplegia and hemiparesis following cerebral infarction affecting right non-dominant side (HCC)  Other lack of coordination  Abnormal posture  Other abnormalities of gait and mobility  Muscle weakness (generalized)  Unsteadiness on feet    Problem List Patient Active Problem List   Diagnosis Date Noted  . Other abnormalities of gait and mobility 09/17/2018  . Acute cerebral infarction ([place])   . Thrombocytopenia ([place])   . 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 ([place])   . Acute blood loss anemia   . Generalized OA   . Gastroesophageal reflux disease   . Seizure prophylaxis   . Leukocytosis   . Tumor 08/18/2018  . Meningioma determined  by biopsy of brain ([place]) 08/18/2018  . MACULAR DEGENERATION 02/29/2008  . ALLERGY 02/29/2008  . Nonspecific (abnormal) findings on radiological and other examination of body structure 12/26/2007  . NONSPECIFIC ABNORM FIND RAD&OTH EXAM LUNG FIELD 12/26/2007  . GALLSTONES 10/01/2007  . ELEVATION, TRANSAMINASE/LDH LEVELS 09/06/2007  . HEPATITIS NOS 09/01/2007  . ABDOMINAL PAIN, EPIGASTRIC 09/01/2007    [facility] 10/28/2018, 8:48 AM  [facility] [address] [place], [place], [address] Phone: [phone]   Fax:  [phone]  Name: CHRISTOPHE RISING MRN: [id number] Date of Birth: 18-Apr-1946   Vianne Bulls, OTR/L [facility] [address] [place], [place]  [address] [phone]  phone [phone] 10/28/18 8:48 AM

## 2018-10-28 NOTE — Therapy (Signed)
[facility] [address] [place], [place], [address] Phone: [phone]   Fax:  [phone]  Speech Language Pathology Treatment/ Renewal note  Patient Details  Name: James Olsen MRN: [id number] Date of Birth: 06/22/1946 Referring Provider (SLP): Alger Simons MD   Encounter Date: 10/28/2018  End of Session - 10/28/18 1155    Visit Number  17    Number of Visits  25    Date for SLP Re-Evaluation  12/08/18    SLP Start Time  0935    SLP Stop Time   1016    SLP Time Calculation (min)  41 min    Activity Tolerance  Patient tolerated treatment well       Past Medical History:  Diagnosis Date  . Arthritis   . GERD (gastroesophageal reflux disease)   . Glaucoma   . History of blood transfusion     5 units after previous surgery  . Macular degeneration   . Stroke [facility])    after surgery in August 2019 mostly recovered still some weakness in left arm  . Wears glasses     Past Surgical History:  Procedure Laterality Date  . CHOLECYSTECTOMY  2008   lap choli  . COLONOSCOPY    . CRANIOTOMY N/A 08/18/2018   Procedure: CRANIOTOMY TUMOR EXCISION;  Surgeon: [name], MD;  Location: [facility];  Service: Neurosurgery;  Laterality: N/A;  CRANIOTOMY TUMOR EXCISION  . NAILBED REPAIR Left 05/04/2014   Procedure: LEFT INDEX NAIL ABLATION V-Y FLAP COVERAGE;  Surgeon: [name]., MD;  Location: [place];  Service: Orthopedics;  Laterality: Left;  index  . PILONIDAL CYST EXCISION     x2  . TONSILLECTOMY    . VASECTOMY      There were no vitals filed for this visit.  Subjective Assessment - 10/28/18 0948    Subjective  "I have my surgery tomorrow and I hope to be back here Wednesday of next week."            ADULT SLP TREATMENT - 10/28/18 0949      General Information   Behavior/Cognition  Alert;Cooperative;Pleasant mood      Treatment Provided   Treatment provided   Cognitive-Linquistic      Cognitive-Linquistic Treatment   Treatment focused on  Cognition    Skilled Treatment  Pt's attention skills and attention to detail in detailed written task today with SLP. Pt with one error noted and self corrected. Pt req'd extra time to complete the task; pt with 90% alternating attention during written task and very light conversation, requiring extra time to re-orient after responding to SLP.       Assessment / Recommendations / Plan   Plan  Continue with current plan of care      Progression Toward Goals   Progression toward goals  Progressing toward goals         SLP Short Term Goals - 10/25/18 1053      SLP SHORT TERM GOAL #1   Title  pt will provide sentence responses 18/20 with WFL speech fluidity over three sessions    Status  Achieved      SLP SHORT TERM GOAL #2   Title  pt will undergo formal cognitive linguistic assessment by visit number 3    Status  Achieved      SLP SHORT TERM GOAL #3   Title  using compensatory strategies pt will functionally participate in 10 minutes simple conversation  over 3 sessions    Status  Achieved       SLP Long Term Goals - 10/28/18 1158      SLP LONG TERM GOAL #1   Title  using compensatory strategies pt will functionally participate in 10 minutes mod complex conversation over 3 sessions    Status  Achieved      SLP LONG TERM GOAL #2   Title  pt will functionally perform complex naming tasks with use of compensations over 2 sessions    Status  Achieved      SLP LONG TERM GOAL #3   Title  pt will demo WNL emergent awareness with mod complex tasks in order to acheive 100% success over three sessions    Baseline  beginning week of 11-01-18    Time  4    Period  Weeks   or 25 total sessions, for all LTGs   Status  On-going      SLP LONG TERM GOAL #4   Title  pt will demo WFL alternating attention between 2-3 mod complex cognitive linguistic tasks adequate for 90% success on all tasks    Time  4     Period  Weeks    Status  New       Plan - 10/28/18 1156    Clinical Impression Statement  Pt's language in 10 minutes mod complex conversation today cont'd as WFL/WNL, comensurate with conversation the previous week. Pt cont'd to demo high level attentional/emergent awareness impairments today in simple but detailed written tasks, extra time allowed consistently. Suspect pt may plateau with cognitive linguistics in next 4-6 weeks. He undergoes cranioplasty tomorrow (10-28-18). I recommend to continue skilled ST to maximize communication and higher level cognition for independence and QOL.     Speech Therapy Frequency  2x / week    Duration  4 weeks   or 25 total sessions   Treatment/Interventions  SLP instruction and feedback;Compensatory strategies;Functional tasks;Cognitive reorganization;Internal/external aids;Patient/family education;Multimodal communcation approach;Environmental controls;Language facilitation;Cueing hierarchy    Potential to Achieve Goals  Good    Potential Considerations  Severity of impairments       Patient will benefit from skilled therapeutic intervention in order to improve the following deficits and impairments:   Cognitive communication deficit - Plan: SLP plan of care cert/re-cert  Aphasia - Plan: SLP plan of care cert/re-cert    Problem List Patient Active Problem List   Diagnosis Date Noted  . Other abnormalities of gait and mobility 09/17/2018  . Acute cerebral infarction ([place])   . Thrombocytopenia ([place])   . 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 ([place])   . Acute blood loss anemia   . Generalized OA   . Gastroesophageal reflux disease   . Seizure prophylaxis   . Leukocytosis   . Tumor 08/18/2018  . Meningioma determined by biopsy of brain ([place]) 08/18/2018  . MACULAR DEGENERATION 02/29/2008  . ALLERGY 02/29/2008  . Nonspecific (abnormal) findings on radiological and other examination of body structure  12/26/2007  . NONSPECIFIC ABNORM FIND RAD&OTH EXAM LUNG FIELD 12/26/2007  . GALLSTONES 10/01/2007  . ELEVATION, TRANSAMINASE/LDH LEVELS 09/06/2007  . HEPATITIS NOS 09/01/2007  . ABDOMINAL PAIN, EPIGASTRIC 09/01/2007    Teigan Sahli ,MS, CCC-SLP  10/28/2018, 12:04 PM  [facility] [address] [place], [place], [address] Phone: [phone]   Fax:  [phone]   Name: James Olsen MRN: [id number] Date of Birth: 1946-07-22

## 2018-10-28 NOTE — Therapy (Signed)
[facility] [address] [place], [place], [address] Phone: [phone]   Fax:  [phone]  Physical Therapy Treatment  Patient Details  Name: James Olsen MRN: [id number] Date of Birth: 02/13/1946 Referring Provider (PT): Dr. Alger Simons   Encounter Date: 10/28/2018  PT End of Session - 10/28/18 1241    Visit Number  11    Number of Visits  16    Date for PT Re-Evaluation  11/06/18    Authorization Type  Medicare Part A&B- Medicare progress note every 10th visit    Authorization Time Period  Follow medicare guidelines     PT Start Time  1015    PT Stop Time  1100    PT Time Calculation (min)  45 min    Activity Tolerance  Patient tolerated treatment well    Behavior During Therapy  [facility] for tasks assessed/performed       Past Medical History:  Diagnosis Date  . Arthritis   . GERD (gastroesophageal reflux disease)   . Glaucoma   . History of blood transfusion     5 units after previous surgery  . Macular degeneration   . Stroke [facility])    after surgery in August 2019 mostly recovered still some weakness in left arm  . Wears glasses     Past Surgical History:  Procedure Laterality Date  . CHOLECYSTECTOMY  2008   lap choli  . COLONOSCOPY    . CRANIOTOMY N/A 08/18/2018   Procedure: CRANIOTOMY TUMOR EXCISION;  Surgeon: [name], MD;  Location: [place];  Service: Neurosurgery;  Laterality: N/A;  CRANIOTOMY TUMOR EXCISION  . NAILBED REPAIR Left 05/04/2014   Procedure: LEFT INDEX NAIL ABLATION V-Y FLAP COVERAGE;  Surgeon: [name]., MD;  Location: [place];  Service: Orthopedics;  Laterality: Left;  index  . PILONIDAL CYST EXCISION     x2  . TONSILLECTOMY    . VASECTOMY      There were no vitals filed for this visit.  Subjective Assessment - 10/28/18 1019    Subjective  Reports everything is going well and he is looking forward to his upcoming surgery tomorrow.      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  No/denies        Skilled Physical Therapy Intervention:  Patient verbalizes and demonstrates understanding of the below updated HEP strengthening exercises to further improve patient's independence with functional mobility:    Forward Monster Walks - 10 reps - 3 sets - 1x daily - 7x weekly  Standing Hip Extension and Flexion with Resistance - 10 reps - 3 sets - 1x daily - 7x weekly    [facility] Adult PT Treatment/Exercise - 10/28/18 1237      Exercises   Exercises  Knee/Hip    Other Exercises   Pt performs eccentric quad exercise on 4 inch step. Pt demonstrates x1 knee buckling episode to L LE during exercise and therapist had Pt attempt same exercise except with a 2inch step. Pt able to perform 1x10 reps of eccentric quads from 2 inch step with UE assist on // bars. Pt then performs 1x10 reps of eccentric quads from 4 inch step with improved control with therapist providing pelvic facilitation to prevent hip  drop during exercise. Pt then performs 1x10 reps of lateral step ups to 6 inch step with therapist providing manual resistance to pelvis for increased challenge to hip musculature.              PT Education - 10/28/18 1241    Education Details  Therapist provided education regarding POC moving forward s/p surgery on 11/8.     Person(s) Educated  Patient    Methods  Explanation    Comprehension  Verbalized understanding       PT Short Term Goals - 10/07/18 0941      PT SHORT TERM GOAL #1   Title  Pt will demonstrate independence and compliancy with HEP to further progress towards improving strength and functional mobility     Baseline  Dependent     Time  4    Period  Weeks    Status  Achieved      PT SHORT TERM GOAL  #2   Title  Pt will improve Berg Balance Assessment by 3 points indicating minimal detectable change and decreased fall risk    Baseline  10/17: 54/56     Time  4    Period  Weeks    Status  Achieved      PT SHORT TERM GOAL #3   Title  Pt will improve gait speed to 4.08 ft/sec using LRAD and supervision to improve functional mobility     Baseline  10/17: 4.33 ft/sec indicative of normal walking speed     Time  4    Period  Weeks    Status  Achieved      PT SHORT TERM GOAL #4   Title  Pt will improve FGA score by 4 points to increase dynamic standing during functional gait using no AD    Baseline  10/17: 23/30     Time  4    Period  Weeks    Status  Achieved      PT SHORT TERM GOAL #5   Title  Pt will ambulate 300 feet with LRAD ambulating over uneven surfaces, curbs, ramps, and demonstrate ability to vary gait speed to improve community accessibility safely     Baseline  10/17: Patient achieves goal with therapist providing supervision for safety considerations    Time  4    Period  Weeks    Status  Achieved        PT Long Term Goals - 10/07/18 0943      PT LONG TERM GOAL #1   Title  Pt will demonstrate independence with HEP to further progress towards increasing strength and functional mobility     Time  8    Period  Weeks    Status  New    Target Date  11/06/18      PT LONG TERM GOAL #2   Title  Pt will improve Berg Balance Score to >55/56 to decrease fall risk potential     Baseline  54/56    Time  8    Period  Weeks    Status  Revised    Target Date  11/06/18      PT LONG TERM GOAL #3   Title  Pt will improve FGA score to >27/30 to improve safety and balance during functional mobility with no AD and supervision     Baseline  23/30    Time  8    Period  Weeks    Status  Revised    Target Date  11/06/18      PT LONG TERM GOAL #4   Title  Pt will ambulate outdoors over uneven surfaces, curbs, ramps, inclines, and demonstrate ability to vary gait speed with  LRAD and Mod I to improve safety with community accessibility     Time  8    Period  Weeks    Status  New    Target Date  11/06/18      PT LONG TERM GOAL #5   Title  Pt will negotiate 12 steps using no HR's and recirprocal stepping technique, mod I to increase safety and independence with community accessibility indicating improvement in LE strength    Time  8    Period  Weeks    Status  Revised    Target Date  11/06/18      PT LONG TERM GOAL #6   Title  Pt will improve gait speed to >/= to 4.37 ft/sec indicating normal walking speed with Mod I and LRAD to increase independence with functional mobility     Baseline  4.33 ft/sec at 4 weeks- goal met    Time  --    Period  --    Status  Deferred            Plan - 10/28/18 1022    Clinical Impression Statement  Skilled session focused on LE strengthening in // bars and updating patient's HEP strengthening exercises. Pt tolerated session well and is able to tolerate LE strengthening exercises with increased resistance. Pt demonstrates increased difficulty with L knee control when performing eccentric quad exercise from 4 inch step requiring increased UE assist on // bars for steadying. Pt continues to demonstrate proximal hip musculature weakness indicated by poor pelvic stability during strengthening exercises. Pt will continue to benefit from skilled PT to address strengthening and high level balance deficits.      Rehab Potential  Good    Clinical Impairments Affecting Rehab Potential  R sided weakness with difficulty negotiating obstacles and performing high level balance    PT Frequency  2x / week    PT Duration  8 weeks    PT Treatment/Interventions  ADLs/Self Care Home Management;Gait training;Neuromuscular re-education;Functional mobility training;Stair training;Cognitive remediation;Energy conservation;Patient/family education;Therapeutic activities;Therapeutic exercise;Orthotic Fit/Training;Balance training;DME Instruction     PT Next Visit Plan  assess LTG after surgery, recert or D/C?    PT Home Exercise Plan  [email or web address] & [email or web address]    Consulted and Agree with Plan of Care  Patient       Patient will benefit from skilled therapeutic intervention in order to improve the following deficits and impairments:  Abnormal gait, Decreased cognition, Decreased knowledge of use of DME, Improper body mechanics, Decreased mobility, Postural dysfunction, Decreased strength, Impaired perceived functional ability, Impaired UE functional use, Decreased balance, Decreased safety awareness, Difficulty walking  Visit Diagnosis: Muscle weakness (generalized)  Other abnormalities of gait and mobility  Abnormal posture     Problem List Patient Active Problem List   Diagnosis Date Noted  . Other abnormalities of gait and mobility 09/17/2018  . Acute cerebral infarction ([place])   . Thrombocytopenia ([place])   . 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 Acute blood loss anemia   . Generalized OA   . Gastroesophageal reflux disease   . Seizure prophylaxis   . Leukocytosis   . Tumor 08/18/2018  . Meningioma determined by biopsy of brain ([place]) 08/18/2018  . MACULAR DEGENERATION 02/29/2008  . ALLERGY 02/29/2008  .  Nonspecific (abnormal) findings on radiological and other examination of body structure 12/26/2007  . NONSPECIFIC ABNORM FIND RAD&OTH EXAM LUNG FIELD 12/26/2007  . GALLSTONES 10/01/2007  . ELEVATION, TRANSAMINASE/LDH LEVELS 09/06/2007  . HEPATITIS NOS 09/01/2007  . ABDOMINAL PAIN, EPIGASTRIC 09/01/2007    Floreen Comber, SPT 10/28/2018, 12:46 PM  [facility] [address] [place], [place], [address] Phone: [phone]   Fax:  [phone]  Name: James Olsen MRN: [id number] Date of Birth: 08-Apr-1946

## 2018-10-28 NOTE — Patient Instructions (Signed)
Access Code: [email or web address]  URL: [email or web address]  Date: 10/28/2018  Prepared by: Floreen Comber   Exercises  Standing Gastroc Stretch at [employer] - 3 reps - 1 sets - 30 hold - 1x daily - 5x weekly  Single Leg Bridge - 5 reps - 3 sets - 1x daily - 5x weekly  Single Leg Sit to Stand with Arms Extended - 10 reps - 2 sets - 1x daily - 5x weekly  Wall Squat - 10 reps - 2 sets - 3-5 hold - 1x daily - 5x weekly  Tandem Stance with Head Rotation - 3 sets - 30 hold - 1x daily - 7x weekly  Romberg Stance with Eyes Closed - 3 sets - 30 hold - 1x daily - 7x weekly  Standing Balance with Eyes Closed on Foam - 3 sets - 30 hold - 1x daily - 7x weekly  Romberg Stance on Foam Pad with Head Rotation - 3 sets - 30 hold - 1x daily - 7x weekly  Tandem Walking with Counter Support - 10 reps - 3 sets - 1x daily - 7x weekly  Forward Monster Walks - 10 reps - 3 sets - 1x daily - 7x weekly  Standing Hip Extension and Flexion with Resistance - 10 reps - 3 sets - 1x daily - 7x weekly

## 2018-10-29 ENCOUNTER — Inpatient Hospital Stay (HOSPITAL_COMMUNITY)
Admission: RE | Admit: 2018-10-29 | Discharge: 2018-10-30 | DRG: 517 | Disposition: A | Discharge status: Home / Self Care | Payer: Medicare Other | Attending: Neurosurgery | Admitting: Neurosurgery

## 2018-10-29 ENCOUNTER — Inpatient Hospital Stay (HOSPITAL_COMMUNITY): Payer: Medicare Other | Admitting: Anesthesiology

## 2018-10-29 ENCOUNTER — Inpatient Hospital Stay (HOSPITAL_COMMUNITY): Payer: Medicare Other | Admitting: Vascular Surgery

## 2018-10-29 ENCOUNTER — Encounter (HOSPITAL_COMMUNITY)
Admission: RE | Disposition: A | Discharge status: Home / Self Care | Payer: Self-pay | Source: Home / Self Care | Attending: Neurosurgery

## 2018-10-29 ENCOUNTER — Encounter (HOSPITAL_COMMUNITY): Payer: Self-pay | Admitting: Certified Registered Nurse Anesthetist

## 2018-10-29 DIAGNOSIS — I69320 Aphasia following cerebral infarction: Secondary | ICD-10-CM | POA: Diagnosis not present

## 2018-10-29 DIAGNOSIS — D329 Benign neoplasm of meninges, unspecified: Secondary | ICD-10-CM | POA: Diagnosis present

## 2018-10-29 DIAGNOSIS — M952 Other acquired deformity of head: Secondary | ICD-10-CM | POA: Diagnosis present

## 2018-10-29 DIAGNOSIS — Z9889 Other specified postprocedural states: Secondary | ICD-10-CM

## 2018-10-29 DIAGNOSIS — Z8249 Family history of ischemic heart disease and other diseases of the circulatory system: Secondary | ICD-10-CM

## 2018-10-29 DIAGNOSIS — K219 Gastro-esophageal reflux disease without esophagitis: Secondary | ICD-10-CM | POA: Diagnosis present

## 2018-10-29 DIAGNOSIS — Z808 Family history of malignant neoplasm of other organs or systems: Secondary | ICD-10-CM | POA: Diagnosis not present

## 2018-10-29 DIAGNOSIS — Z9049 Acquired absence of other specified parts of digestive tract: Secondary | ICD-10-CM | POA: Diagnosis not present

## 2018-10-29 DIAGNOSIS — Z833 Family history of diabetes mellitus: Secondary | ICD-10-CM

## 2018-10-29 HISTORY — PX: CRANIOPLASTY: SHX1407

## 2018-10-29 LAB — TYPE AND SCREEN
ABO/RH(D): A POS
Antibody Screen: NEGATIVE

## 2018-10-29 LAB — MRSA PCR SCREENING: MRSA by PCR: NEGATIVE

## 2018-10-29 SURGERY — CRANIOPLASTY
Anesthesia: General

## 2018-10-29 MED ORDER — PANTOPRAZOLE SODIUM 40 MG IV SOLR
40.0000 mg | Freq: Every day | INTRAVENOUS | Status: DC
  Administered 2018-10-29: 40 mg via INTRAVENOUS
  Filled 2018-10-29: qty 40

## 2018-10-29 MED ORDER — LACTATED RINGERS IV SOLN
INTRAVENOUS | Status: DC | PRN
  Administered 2018-10-29: 11:00:00 via INTRAVENOUS

## 2018-10-29 MED ORDER — LIDOCAINE-EPINEPHRINE 0.5 %-1:200000 IJ SOLN
INTRAMUSCULAR | Status: AC
  Filled 2018-10-29: qty 1

## 2018-10-29 MED ORDER — ONDANSETRON HCL 4 MG PO TABS: 4.0000 mg | ORAL_TABLET | ORAL | Status: DC | PRN

## 2018-10-29 MED ORDER — OCUVITE-LUTEIN PO CAPS
1.0000 | ORAL_CAPSULE | Freq: Two times a day (BID) | ORAL | Status: DC
  Filled 2018-10-29 (×4): qty 1

## 2018-10-29 MED ORDER — ONDANSETRON HCL 4 MG/2ML IJ SOLN
INTRAMUSCULAR | Status: DC | PRN
  Administered 2018-10-29: 4 mg via INTRAVENOUS

## 2018-10-29 MED ORDER — CEFAZOLIN SODIUM-DEXTROSE 2-4 GM/100ML-% IV SOLN
2.0000 g | INTRAVENOUS | Status: AC
  Administered 2018-10-29: 2 g via INTRAVENOUS
  Filled 2018-10-29: qty 100

## 2018-10-29 MED ORDER — MORPHINE SULFATE (PF) 2 MG/ML IV SOLN: 1.0000 mg | INTRAVENOUS | Status: DC | PRN

## 2018-10-29 MED ORDER — BACITRACIN ZINC 500 UNIT/GM EX OINT
TOPICAL_OINTMENT | CUTANEOUS | Status: AC
  Filled 2018-10-29: qty 28.35

## 2018-10-29 MED ORDER — LACTATED RINGERS IV SOLN
INTRAVENOUS | Status: DC
  Administered 2018-10-29: 08:00:00 via INTRAVENOUS

## 2018-10-29 MED ORDER — LIDOCAINE-EPINEPHRINE 0.5 %-1:200000 IJ SOLN
INTRAMUSCULAR | Status: DC | PRN
  Administered 2018-10-29: 3 mL

## 2018-10-29 MED ORDER — FENTANYL CITRATE (PF) 100 MCG/2ML IJ SOLN: 25.0000 ug | INTRAMUSCULAR | Status: DC | PRN

## 2018-10-29 MED ORDER — THROMBIN 20000 UNITS EX SOLR
CUTANEOUS | Status: DC | PRN
  Administered 2018-10-29: 09:00:00 via TOPICAL

## 2018-10-29 MED ORDER — SUGAMMADEX SODIUM 200 MG/2ML IV SOLN
INTRAVENOUS | Status: DC | PRN
  Administered 2018-10-29: 180.6 mg via INTRAVENOUS

## 2018-10-29 MED ORDER — LEVETIRACETAM IN NACL 500 MG/100ML IV SOLN
500.0000 mg | Freq: Two times a day (BID) | INTRAVENOUS | Status: DC
  Administered 2018-10-29 – 2018-10-30 (×2): 500 mg via INTRAVENOUS
  Filled 2018-10-29 (×2): qty 100

## 2018-10-29 MED ORDER — LIDOCAINE 20MG/ML (2%) 15 ML SYRINGE OPTIME
INTRAMUSCULAR | Status: DC | PRN
  Administered 2018-10-29: 100 mg via INTRAVENOUS

## 2018-10-29 MED ORDER — ONDANSETRON HCL 4 MG/2ML IJ SOLN: 4.0000 mg | Freq: Once | INTRAMUSCULAR | Status: DC | PRN

## 2018-10-29 MED ORDER — LEVETIRACETAM 500 MG PO TABS: 500.0000 mg | ORAL_TABLET | Freq: Two times a day (BID) | ORAL | Status: DC

## 2018-10-29 MED ORDER — FENTANYL CITRATE (PF) 100 MCG/2ML IJ SOLN
INTRAMUSCULAR | Status: DC | PRN
  Administered 2018-10-29: 100 ug via INTRAVENOUS
  Administered 2018-10-29 (×2): 50 ug via INTRAVENOUS

## 2018-10-29 MED ORDER — OXYCODONE HCL 5 MG/5ML PO SOLN: 5.0000 mg | Freq: Once | ORAL | Status: DC | PRN

## 2018-10-29 MED ORDER — PROPOFOL 10 MG/ML IV BOLUS
INTRAVENOUS | Status: AC
  Filled 2018-10-29: qty 40

## 2018-10-29 MED ORDER — LIDOCAINE 2% (20 MG/ML) 5 ML SYRINGE
INTRAMUSCULAR | Status: AC
  Filled 2018-10-29: qty 5

## 2018-10-29 MED ORDER — HYDROCODONE-ACETAMINOPHEN 5-325 MG PO TABS: 1.0000 | ORAL_TABLET | ORAL | Status: DC | PRN

## 2018-10-29 MED ORDER — CHLORHEXIDINE GLUCONATE CLOTH 2 % EX PADS: 6.0000 | MEDICATED_PAD | Freq: Once | CUTANEOUS | Status: DC

## 2018-10-29 MED ORDER — EPHEDRINE 5 MG/ML INJ
INTRAVENOUS | Status: AC
  Filled 2018-10-29: qty 10

## 2018-10-29 MED ORDER — 0.9 % SODIUM CHLORIDE (POUR BTL) OPTIME
TOPICAL | Status: DC | PRN
  Administered 2018-10-29: 1000 mL

## 2018-10-29 MED ORDER — LABETALOL HCL 5 MG/ML IV SOLN: 10.0000 mg | INTRAVENOUS | Status: DC | PRN

## 2018-10-29 MED ORDER — ONDANSETRON HCL 4 MG/2ML IJ SOLN: 4.0000 mg | INTRAMUSCULAR | Status: DC | PRN

## 2018-10-29 MED ORDER — PROPOFOL 10 MG/ML IV BOLUS
INTRAVENOUS | Status: DC | PRN
  Administered 2018-10-29: 200 mg via INTRAVENOUS

## 2018-10-29 MED ORDER — NALOXONE HCL 0.4 MG/ML IJ SOLN: 0.0800 mg | INTRAMUSCULAR | Status: DC | PRN

## 2018-10-29 MED ORDER — ROCURONIUM BROMIDE 10 MG/ML (PF) SYRINGE
PREFILLED_SYRINGE | INTRAVENOUS | Status: DC | PRN
  Administered 2018-10-29 (×2): 50 mg via INTRAVENOUS

## 2018-10-29 MED ORDER — ONDANSETRON HCL 4 MG/2ML IJ SOLN
INTRAMUSCULAR | Status: AC
  Filled 2018-10-29: qty 2

## 2018-10-29 MED ORDER — SUCCINYLCHOLINE CHLORIDE 200 MG/10ML IV SOSY
PREFILLED_SYRINGE | INTRAVENOUS | Status: AC
  Filled 2018-10-29: qty 10

## 2018-10-29 MED ORDER — DEXAMETHASONE SODIUM PHOSPHATE 10 MG/ML IJ SOLN
INTRAMUSCULAR | Status: AC
  Filled 2018-10-29: qty 1

## 2018-10-29 MED ORDER — FAMOTIDINE 20 MG PO TABS
20.0000 mg | ORAL_TABLET | Freq: Every day | ORAL | Status: DC
  Administered 2018-10-29: 20 mg via ORAL
  Filled 2018-10-29: qty 1

## 2018-10-29 MED ORDER — DEXAMETHASONE SODIUM PHOSPHATE 10 MG/ML IJ SOLN
INTRAMUSCULAR | Status: DC | PRN
  Administered 2018-10-29: 10 mg via INTRAVENOUS

## 2018-10-29 MED ORDER — PHENYLEPHRINE 40 MCG/ML (10ML) SYRINGE FOR IV PUSH (FOR BLOOD PRESSURE SUPPORT)
PREFILLED_SYRINGE | INTRAVENOUS | Status: AC
  Filled 2018-10-29: qty 10

## 2018-10-29 MED ORDER — POTASSIUM CHLORIDE IN NACL 20-0.9 MEQ/L-% IV SOLN
INTRAVENOUS | Status: DC
  Administered 2018-10-29: 14:00:00 via INTRAVENOUS
  Filled 2018-10-29 (×2): qty 1000

## 2018-10-29 MED ORDER — FENTANYL CITRATE (PF) 250 MCG/5ML IJ SOLN
INTRAMUSCULAR | Status: AC
  Filled 2018-10-29: qty 5

## 2018-10-29 MED ORDER — SUGAMMADEX SODIUM 200 MG/2ML IV SOLN
INTRAVENOUS | Status: AC
  Filled 2018-10-29: qty 2

## 2018-10-29 MED ORDER — THROMBIN (RECOMBINANT) 20000 UNITS EX SOLR
CUTANEOUS | Status: AC
  Filled 2018-10-29: qty 20000

## 2018-10-29 MED ORDER — SODIUM CHLORIDE 0.9 % IV SOLN
INTRAVENOUS | Status: DC | PRN
  Administered 2018-10-29: 25 ug/min via INTRAVENOUS

## 2018-10-29 MED ORDER — OXYCODONE HCL 5 MG PO TABS: 5.0000 mg | ORAL_TABLET | Freq: Once | ORAL | Status: DC | PRN

## 2018-10-29 SURGICAL SUPPLY — 48 items
BLADE CLIPPER SURG (BLADE) ×2 IMPLANT
BNDG STRETCH 4X75 STRL LF (GAUZE/BANDAGES/DRESSINGS) IMPLANT
CANISTER SUCT 3000ML PPV (MISCELLANEOUS) ×2 IMPLANT
CARTRIDGE OIL MAESTRO DRILL (MISCELLANEOUS) IMPLANT
CLIP RANEY DISP (INSTRUMENTS) IMPLANT
COVER BACK TABLE 60X90IN (DRAPES) IMPLANT
COVER WAND RF STERILE (DRAPES) IMPLANT
DECANTER SPIKE VIAL GLASS SM (MISCELLANEOUS) ×2 IMPLANT
DERMABOND ADVANCED (GAUZE/BANDAGES/DRESSINGS) ×1
DERMABOND ADVANCED .7 DNX12 (GAUZE/BANDAGES/DRESSINGS) ×1 IMPLANT
DIFFUSER DRILL AIR PNEUMATIC (MISCELLANEOUS) IMPLANT
DRAPE NEUROLOGICAL W/INCISE (DRAPES) ×2 IMPLANT
DRAPE WARM FLUID 44X44 (DRAPE) IMPLANT
DURAPREP 6ML APPLICATOR 50/CS (WOUND CARE) ×4 IMPLANT
ELECT REM PT RETURN 9FT ADLT (ELECTROSURGICAL) ×2
ELECTRODE REM PT RTRN 9FT ADLT (ELECTROSURGICAL) ×1 IMPLANT
GAUZE 4X4 16PLY RFD (DISPOSABLE) IMPLANT
GAUZE SPONGE 4X4 12PLY STRL (GAUZE/BANDAGES/DRESSINGS) IMPLANT
GLOVE ECLIPSE 6.5 STRL STRAW (GLOVE) ×2 IMPLANT
GLOVE EXAM NITRILE XL STR (GLOVE) IMPLANT
GOWN STRL REUS W/ TWL LRG LVL3 (GOWN DISPOSABLE) ×2 IMPLANT
GOWN STRL REUS W/ TWL XL LVL3 (GOWN DISPOSABLE) IMPLANT
GOWN STRL REUS W/TWL 2XL LVL3 (GOWN DISPOSABLE) IMPLANT
GOWN STRL REUS W/TWL LRG LVL3 (GOWN DISPOSABLE) ×2
GOWN STRL REUS W/TWL XL LVL3 (GOWN DISPOSABLE)
HEMOSTAT SURGICEL 2X14 (HEMOSTASIS) IMPLANT
KIT BASIN OR (CUSTOM PROCEDURE TRAY) ×2 IMPLANT
KIT TURNOVER KIT B (KITS) ×2 IMPLANT
NEEDLE HYPO 25X1 1.5 SAFETY (NEEDLE) ×2 IMPLANT
NS IRRIG 1000ML POUR BTL (IV SOLUTION) ×2 IMPLANT
OIL CARTRIDGE MAESTRO DRILL (MISCELLANEOUS)
PACK CRANIOTOMY CUSTOM (CUSTOM PROCEDURE TRAY) ×2 IMPLANT
PAD ARMBOARD 7.5X6 YLW CONV (MISCELLANEOUS) ×6 IMPLANT
PEEK CRANIOPLASTY SMALL (Neuro Prosthesis/Implant) ×2 IMPLANT
PIN MAYFIELD SKULL DISP (PIN) IMPLANT
PLATE CRANIAL 12 2H RIGID UNI (Plate) ×6 IMPLANT
SCREW UNIII AXS SD 1.5X4 (Screw) ×12 IMPLANT
SET CRAINOPLASTY (SET/KITS/TRAYS/PACK) ×2 IMPLANT
SPONGE SURGIFOAM ABS GEL 100 (HEMOSTASIS) ×2 IMPLANT
STAPLER SKIN PROX WIDE 3.9 (STAPLE) ×2 IMPLANT
SUT ETHILON 3 0 FSL (SUTURE) ×2 IMPLANT
SUT NURALON 4 0 TR CR/8 (SUTURE) IMPLANT
SUT VIC AB 2-0 CT2 18 VCP726D (SUTURE) ×2 IMPLANT
TOWEL GREEN STERILE (TOWEL DISPOSABLE) ×2 IMPLANT
TOWEL GREEN STERILE FF (TOWEL DISPOSABLE) ×2 IMPLANT
TRAY FOLEY MTR SLVR 16FR STAT (SET/KITS/TRAYS/PACK) IMPLANT
UNDERPAD 30X30 (UNDERPADS AND DIAPERS) IMPLANT
WATER STERILE IRR 1000ML POUR (IV SOLUTION) ×2 IMPLANT

## 2018-10-29 NOTE — Transfer of Care (Signed)
Immediate Anesthesia Transfer of Care Note  Patient: James Olsen  Procedure(s) Performed: CRANIOPLASTY (N/A )  Patient Location: PACU  Anesthesia Type:General  Level of Consciousness: drowsy and patient cooperative  Airway & Oxygen Therapy: Patient Spontanous Breathing and Patient connected to nasal cannula oxygen  Post-op Assessment: Report given to RN, Post -op Vital signs reviewed and stable and Patient moving all extremities X 4  Post vital signs: Reviewed and stable  Last Vitals:  Vitals Value Taken Time  BP    Temp    Pulse    Resp    SpO2      Last Pain:  Vitals:   10/29/18 0725  TempSrc: Oral         Complications: No apparent anesthesia complications

## 2018-10-29 NOTE — Op Note (Signed)
10/29/2018  12:12 PM  PATIENT:  James Olsen  72 y.o. male status post meningioma resection with residual tumor. I removed a portion of the skull due to its infiltration by tumor. He was left with a cranial defect, and will undergo a cranioplasty with custom peek inmplatn  PRE-OPERATIVE DIAGNOSIS:  Meningioma  POST-OPERATIVE DIAGNOSIS:  meningioma  PROCEDURE:  Procedure(s): CRANIOPLASTY  SURGEON: Surgeon(s): [name], MD  ASSISTANTS:none  ANESTHESIA:   general  EBL:  Total I/O In: 700 [I.V.:700] Out: 150 [Blood:150]  BLOOD ADMINISTERED:none    COUNT:per nursing  DRAINS: none   SPECIMEN:  No Specimen  DICTATION: [name] was taken to the operating room, intubated, and placed under a general anesthetic without difficulty. I positioned his head on a horseshoe headrest, with him supine. His head was shaved and prepped in a sterile manner. I infiltrated lidocaine in the scalp at the existing incision. I draped his head, then started the procedure. I opened the scalp with a 10 blade and dissected sharply to expose the craniectomy . I used multiple instruments to clear the skull and the craniectomy edges. I placed the peek implant and used plates and screws to secure to the surrounding skull. I irrigated, and closed the wound in layers. I approximated the galea, with vicryl sutures and the scalp edges with nylon. I placed a sterile dressing. He was extubated and moving all extremities.  PLAN OF CARE: Admit to inpatient   PATIENT DISPOSITION:  PACU - hemodynamically stable.   Delay start of Pharmacological VTE agent (>24hrs) due to surgical blood loss or risk of bleeding:  yes

## 2018-10-29 NOTE — Anesthesia Postprocedure Evaluation (Signed)
Anesthesia Post Note  Patient: James Olsen  Procedure(s) Performed: CRANIOPLASTY (N/A )     Patient location during evaluation: PACU Anesthesia Type: General Level of consciousness: awake and alert Pain management: pain level controlled Vital Signs Assessment: post-procedure vital signs reviewed and stable Respiratory status: spontaneous breathing, nonlabored ventilation and respiratory function stable Cardiovascular status: blood pressure returned to baseline and stable Postop Assessment: no apparent nausea or vomiting Anesthetic complications: no    Last Vitals:  Vitals:   10/29/18 1252 10/29/18 1300  BP:  (!) 147/90  Pulse:  73  Resp:  11  Temp: (!) 36.4 C   SpO2:  99%    Last Pain:  Vitals:   10/29/18 1300  TempSrc:   PainSc: 2                  Lidia Collum

## 2018-10-29 NOTE — Anesthesia Procedure Notes (Signed)
Procedure Name: Intubation Date/Time: 10/29/2018 10:15 AM Performed by: Lowella Dell, CRNA Pre-anesthesia Checklist: Patient identified, Emergency Drugs available, Suction available and Patient being monitored Patient Re-evaluated:Patient Re-evaluated prior to induction Oxygen Delivery Method: Circle System Utilized Preoxygenation: Pre-oxygenation with 100% oxygen Induction Type: IV induction Ventilation: Mask ventilation without difficulty Laryngoscope Size: Glidescope and 4 Grade View: Grade I Tube type: Oral Tube size: 7.5 mm Number of attempts: 1 Airway Equipment and Method: Video-laryngoscopy and Rigid stylet Placement Confirmation: ETT inserted through vocal cords under direct vision,  positive ETCO2 and breath sounds checked- equal and bilateral Secured at: 21 cm Tube secured with: Tape Dental Injury: Teeth and Oropharynx as per pre-operative assessment  Difficulty Due To: Difficult Airway- due to immobile epiglottis, Difficult Airway- due to anterior larynx and Difficulty was anticipated

## 2018-10-29 NOTE — H&P (Signed)
Cc Cranial defect James Olsen is a 72 y.o. male Whom had a meningioma resected and was left with a cranial defect. He is admitted for a cranioplasty.  No Known Allergies Past Medical History:  Diagnosis Date  . Arthritis   . GERD (gastroesophageal reflux disease)   . Glaucoma   . History of blood transfusion     5 units after previous surgery  . Macular degeneration   . Stroke [facility])    after surgery in August 2019 mostly recovered still some weakness in left arm  . Wears glasses    Past Surgical History:  Procedure Laterality Date  . CHOLECYSTECTOMY  2008   lap choli  . COLONOSCOPY    . CRANIOTOMY N/A 08/18/2018   Procedure: CRANIOTOMY TUMOR EXCISION;  Surgeon: [name], MD;  Location: [place];  Service: Neurosurgery;  Laterality: N/A;  CRANIOTOMY TUMOR EXCISION  . NAILBED REPAIR Left 05/04/2014   Procedure: LEFT INDEX NAIL ABLATION V-Y FLAP COVERAGE;  Surgeon: [name]., MD;  Location: [place];  Service: Orthopedics;  Laterality: Left;  index  . PILONIDAL CYST EXCISION     x2  . TONSILLECTOMY    . VASECTOMY     Family History  Problem Relation Age of Onset  . Diabetes Mother   . Skin cancer Brother   . Congenital heart disease Brother    Social History   Socioeconomic History  . Marital status: Married    Spouse name: Not on file  . Number of children: Not on file  . Years of education: Not on file  . Highest education level: Not on file  Occupational History  . Not on file  Social Needs  . Financial resource strain: Not on file  . Food insecurity:    Worry: Not on file    Inability: Not on file  . Transportation needs:    Medical: Not on file    Non-medical: Not on file  Tobacco Use  . Smoking status: Never Smoker  . Smokeless tobacco: Never Used  Substance and Sexual Activity  . Alcohol use: Yes    Comment: occasionally  . Drug use: No  . Sexual activity: Not on file  Lifestyle  . Physical activity:    Days per  week: Not on file    Minutes per session: Not on file  . Stress: Not on file  Relationships  . Social connections:    Talks on phone: Not on file    Gets together: Not on file    Attends religious service: Not on file    Active member of club or organization: Not on file    Attends meetings of clubs or organizations: Not on file    Relationship status: Not on file  . Intimate partner violence:    Fear of current or ex partner: Not on file    Emotionally abused: Not on file    Physically abused: Not on file    Forced sexual activity: Not on file  Other Topics Concern  . Not on file  Social History Narrative  . Not on file   Physical Exam  Constitutional: He is oriented to person, place, and time. He appears well-developed and well-nourished. No distress.  HENT:  Right Ear: External ear normal.  Left Ear: External ear normal.  Mouth/Throat: Oropharynx is clear and moist.  Craniectomy site  Eyes: Pupils are equal, round, and reactive to light. Conjunctivae and EOM are normal.  Neck: Normal range of motion.  Neck supple.  Cardiovascular: Normal rate, regular rhythm, normal heart sounds and intact distal pulses.  Pulmonary/Chest: Effort normal and breath sounds normal.  Abdominal: Soft. Bowel sounds are normal.  Musculoskeletal: Normal range of motion.  Neurological: He is alert and oriented to person, place, and time. He displays normal reflexes. No cranial nerve deficit or sensory deficit. He exhibits normal muscle tone. Coordination normal.  Mild weakness right side  Skin: Skin is warm and dry.  Psychiatric: He has a normal mood and affect. His behavior is normal. Judgment and thought content normal.  admit for cranioplasty. Risks including but not limited to bleeding, infection, brain damage, need for further surgery. He understands and wishes to proceed

## 2018-10-30 MED ORDER — PROSIGHT PO TABS: 1.0000 | ORAL_TABLET | Freq: Every day | ORAL | Status: DC

## 2018-10-30 NOTE — Progress Notes (Signed)
Spoke w patient at bedside, he will resume OP PT OT at DC. No CM action required, no other CM needs identified.

## 2018-10-30 NOTE — Discharge Instructions (Signed)
Call MD office on Monday to request letter to return to OT and PT.

## 2018-10-30 NOTE — Discharge Summary (Signed)
Physician Discharge Summary  Patient ID: James Olsen MRN: [id number] DOB/AGE: 71-19-1947 71 y.o.  Admit date: 10/29/2018 Discharge date: 10/30/2018  Admission Diagnoses: Craniotomy defect    Discharge Diagnoses: Cranioplasty   Discharged Condition: good  Hospital Course: The patient was admitted on 10/29/2018 and taken to the operating room where the patient underwent cranioplasty. The patient tolerated the procedure well and was taken to the recovery room and then to the ICU in stable condition. The hospital course was routine. There were no complications. The wound remained clean dry and intact. Pt had appropriate head soreness. No complaints of pain or new N/T/W. The patient remained afebrile with stable vital signs, and tolerated a regular diet. The patient continued to increase activities, and pain was well controlled with oral pain medications.   Consults: None  Significant Diagnostic Studies:  Results for orders placed or performed during the hospital encounter of 10/29/18  MRSA PCR Screening  Result Value Ref Range   MRSA by PCR NEGATIVE NEGATIVE  Type and screen  Result Value Ref Range   ABO/RH(D) A POS    Antibody Screen NEG    Sample Expiration      11/01/2018 Performed at [facility], [place] [address], [place], [place] [address]     No results found.  Antibiotics:  Anti-infectives (From admission, onward)   Start     Dose/Rate Route Frequency Ordered Stop   10/29/18 0715  ceFAZolin (ANCEF) IVPB 2g/100 mL premix     2 g 200 mL/hr over 30 Minutes Intravenous On call to O.R. 10/29/18 0710 10/29/18 1021      Discharge Exam: Blood pressure 108/70, pulse (!) 58, temperature 98.1 F (36.7 C), temperature source Oral, resp. rate (!) 9, height 5\' 9"  (1.753 m), SpO2 96 %. Neurologic: Grossly normal Dressing dry  Discharge Medications:   Allergies as of 10/30/2018   No Known Allergies     Medication List    TAKE these medications   levETIRAcetam  500 MG tablet Commonly known as:  KEPPRA Take 1 tablet (500 mg total) by mouth 2 (two) times daily.   PRESERVISION AREDS 2 Caps Take 1 capsule by mouth 2 (two) times daily.   ranitidine 150 MG tablet Commonly known as:  ZANTAC Take 150 mg by mouth daily as needed for heartburn.       Disposition: Home  Final Dx: Cranioplasty  Discharge Instructions     Remove dressing in 72 hours   Complete by:  As directed    Call MD for:  difficulty breathing, headache or visual disturbances   Complete by:  As directed    Call MD for:  persistant nausea and vomiting   Complete by:  As directed    Call MD for:  redness, tenderness, or signs of infection (pain, swelling, redness, odor or green/yellow discharge around incision site)   Complete by:  As directed    Call MD for:  severe uncontrolled pain   Complete by:  As directed    Call MD for:  temperature >100.4   Complete by:  As directed    Diet - low sodium heart healthy   Complete by:  As directed    Increase activity slowly   Complete by:  As directed       Follow-up Information    [name], MD. Schedule an appointment as soon as possible for a visit in 2 week(s).   Specialty:  Neurosurgery Contact information: [address]  [address] [phone]            Signed: Eustace Moore 10/30/2018, 7:56 AM

## 2018-10-30 NOTE — Progress Notes (Signed)
Patient [place] home via car with spouse.  DC instructions given to patient and fully understood.  Vital signs and assessments were stable.

## 2018-10-31 ENCOUNTER — Encounter (HOSPITAL_COMMUNITY): Payer: Self-pay | Admitting: Neurosurgery

## 2018-11-02 NOTE — Telephone Encounter (Signed)
Message from patient

## 2018-11-03 ENCOUNTER — Ambulatory Visit: Payer: Medicare Other

## 2018-11-03 ENCOUNTER — Ambulatory Visit: Payer: Medicare Other | Admitting: Occupational Therapy

## 2018-11-03 ENCOUNTER — Ambulatory Visit: Payer: Medicare Other | Admitting: Physical Therapy

## 2018-11-03 NOTE — Progress Notes (Signed)
Location/Histology of Brain Tumor:  08/18/18 Diagnosis 1. Brain, for tumor resection, Intracranial - MENINGIOMA, WHO GRADE 2. - SEE MICROSCOPIC DESCRIPTION. 2. Bone, fragment(s), Skull - MENINGIOMA, WHO GRADE 2. - SEE MICROSCOPIC DESCRIPTION. 3. Bone, fragment(s), Lateral Skull - MENINGIOMA, WHO GRADE 2. - SEE MICROSCOPIC DESCRIPTION. 4. Brain, for tumor resection, Intracranial - MENINGIOMA, WHO GRADE 2. - SEE MICROSCOPIC DESCRIPTION  Patient presented with symptoms of:  Per Dr. Lacy Duverney note- his hairdresser noticed a lump on his head.   Past or anticipated interventions, if any, per neurosurgery:  08/18/18 PROCEDURE:  Procedure(s): CRANIOTOMY TUMOR EXCISION SURGEON: Surgeon(s): [name], MD Newman Pies, MD  10/29/18 PROCEDURE:  Procedure(s): CRANIOPLASTY SURGEON: Surgeon(s): [name], MD  Past or anticipated interventions, if any, per medical oncology: N/A  Dose of Decadron, if applicable: N/A  Recent neurologic symptoms, if any:   Seizures: No  Headaches: No  Nausea: No  Dizziness/ataxia: No  Difficulty with hand coordination: No  Focal numbness/weakness: He had a stroke after his surgery. He reports numbness before tumor was removed, but improved now.   Visual deficits/changes: No  Confusion/Memory deficits: No   SAFETY ISSUES:  Prior radiation? No  Pacemaker/ICD? No  Possible current pregnancy? No  Is the patient on methotrexate? No  Additional Complaints / other details:   BP (!) 135/91 (BP Location: Left Arm, Patient Position: Sitting)   Pulse 72   Temp 97.9 F (36.6 C) (Oral)   Resp 18   Ht 5' 9.5" (1.765 m)   Wt 201 lb (91.2 kg)   SpO2 100%   BMI 29.26 kg/m    Wt Readings from Last 3 Encounters:  11/10/18 201 lb (91.2 kg)  11/09/18 201 lb (91.2 kg)  10/22/18 199 lb (90.3 kg)

## 2018-11-04 ENCOUNTER — Ambulatory Visit: Payer: Medicare Other | Admitting: Physical Therapy

## 2018-11-04 ENCOUNTER — Encounter: Payer: Medicare Other | Admitting: Occupational Therapy

## 2018-11-08 ENCOUNTER — Ambulatory Visit: Payer: Medicare Other

## 2018-11-08 ENCOUNTER — Encounter: Payer: Self-pay | Admitting: Occupational Therapy

## 2018-11-08 ENCOUNTER — Ambulatory Visit: Payer: Medicare Other | Admitting: Occupational Therapy

## 2018-11-08 DIAGNOSIS — R41841 Cognitive communication deficit: Secondary | ICD-10-CM | POA: Diagnosis present

## 2018-11-08 DIAGNOSIS — R293 Abnormal posture: Secondary | ICD-10-CM

## 2018-11-08 DIAGNOSIS — M6281 Muscle weakness (generalized): Secondary | ICD-10-CM | POA: Diagnosis present

## 2018-11-08 DIAGNOSIS — I69353 Hemiplegia and hemiparesis following cerebral infarction affecting right non-dominant side: Secondary | ICD-10-CM

## 2018-11-08 DIAGNOSIS — R278 Other lack of coordination: Secondary | ICD-10-CM

## 2018-11-08 DIAGNOSIS — R2681 Unsteadiness on feet: Secondary | ICD-10-CM | POA: Diagnosis present

## 2018-11-08 DIAGNOSIS — R2689 Other abnormalities of gait and mobility: Secondary | ICD-10-CM | POA: Diagnosis present

## 2018-11-08 DIAGNOSIS — R4701 Aphasia: Secondary | ICD-10-CM | POA: Diagnosis present

## 2018-11-08 NOTE — Therapy (Signed)
[facility] [address] [place], [place], [address] Phone: [phone]   Fax:  [phone]  Occupational Therapy Treatment  Patient Details  Name: James Olsen MRN: [id number] Date of Birth: [birth year] No data recorded  Encounter Date: 11/08/2018  OT End of Session - 11/08/18 1027    Visit Number  17    Number of Visits  25    Date for OT Re-Evaluation  12/06/18    Authorization Type  cert. 09/07/18-12/06/18    Authorization Time Period  Medicare & [employer], covered 100%    Authorization - Visit Number  50    Authorization - Number of Visits  20    OT Start Time  6789    OT Stop Time  1102    OT Time Calculation (min)  39 min    Activity Tolerance  Patient tolerated treatment well    Behavior During Therapy  WFL for tasks assessed/performed       Past Medical History:  Diagnosis Date  . Arthritis   . GERD (gastroesophageal reflux disease)   . Glaucoma   . History of blood transfusion     5 units after previous surgery  . Macular degeneration   . Stroke [facility])    after surgery in August 2019 mostly recovered still some weakness in left arm  . Wears glasses     Past Surgical History:  Procedure Laterality Date  . CHOLECYSTECTOMY  2008   lap choli  . COLONOSCOPY    . CRANIOPLASTY N/A 10/29/2018   Procedure: CRANIOPLASTY;  Surgeon: [name], MD;  Location: [facility];  Service: Neurosurgery;  Laterality: N/A;  CRANIOPLASTY  . CRANIOTOMY N/A 08/18/2018   Procedure: CRANIOTOMY TUMOR EXCISION;  Surgeon: [name], MD;  Location: [place];  Service: Neurosurgery;  Laterality: N/A;  CRANIOTOMY TUMOR EXCISION  . NAILBED REPAIR Left 05/04/2014   Procedure: LEFT INDEX NAIL ABLATION V-Y FLAP COVERAGE;  Surgeon: [name]., MD;  Location: [place];  Service: Orthopedics;  Laterality: Left;  index  . PILONIDAL CYST EXCISION     x2  . TONSILLECTOMY    . VASECTOMY      There were no vitals  filed for this visit.  Subjective Assessment - 11/08/18 1025    Subjective   Surgery went well.   No restrictions/cleared by MD.  Pt reports no change from surgery and reports that he resumed previous activities.  Has consultant for radiation Wednesday.    Pertinent History  Meningioma s/p craniotomy for tumor resection 08/18/18, Acute cerebral infarction; arthritis, macular degeneration, hepatitis, GERD    Patient Stated Goals  be back to normal or 95%    Currently in Pain?  No/denies       Picking up blocks using gripper set on level 2 to stack 5 high and then unstack to place in container at midlevel reaching for incr strength and endurance.  Pt with mod difficulty/drops due to fatigue/tremors.  Upper Extremity Functional Scale:  68/80 with min difficulty with the following tasks:  Household and recreational activities, lifting bag of groceries to waist, preparing food, vacuuming/sweeping/raking, dressing/buttons, using appliances/tools, cleaning, throwing ball.  Mod difficulty with lifting bag groceries overhead.    Discussed progress towards goals.  Pt reports using RUE for all previous tasks except for holding hot pan with RUE only and retrieving something fragile/heavier from overhead.    Fastening/unfastening nuts/bolts overhead with vision occluded with mod difficulty with coordination.  Carrying pan with  2lb wt with RUE approx 222f without rest/spills.  Placing 2lb object overhead with min decr in control but safe x5.  Placing O'connor pegs in pegboard with tweezers with mod difficulty.              OT Short Term Goals - 09/28/18 0753      OT SHORT TERM GOAL #1   Title  Pt will be independent with initial HEP.--check STG 10/22/18    Time  6    Period  Weeks    Status  Achieved      OT SHORT TERM GOAL #2   Title  Pt will be able to bathe mod I.    Time  6    Period  Weeks    Status  Achieved      OT SHORT TERM GOAL #3   Title  Pt will be able to complete  clothing fasteners mod I.    Time  6    Period  Weeks    Status  Achieved   09/28/18:  incr time     OT SHORT TERM GOAL #4   Title  Pt will be able to cut food mod I.    Time  6    Period  Weeks    Status  Achieved   09/28/18:  able to perform with fork/knife but no chopping/peeling     OT SHORT TERM GOAL #5   Title  Pt will demo at least 60* R shoulder flex with only min compensation for improved functional reach.    Baseline  45* with mod compensation    Period  Weeks    Status  Achieved   09/28/18:  WNL     OT SHORT TERM GOAL #6   Title  Pt will be able to use RUE as stabilizer consistently for ADLs.    Time  6    Period  Weeks    Status  Achieved        OT Long Term Goals - 11/08/18 1032      OT LONG TERM GOAL #1   Title  Pt will be independent with updated HEP.--check LTGs 12/06/18    Time  12    Period  Weeks    Status  On-going      OT LONG TERM GOAL #2   Title  Pt will perform simple cooking tasks mod I.--09/28/18 upgraded to mod complex cooking involving carrying pots/pans and chopping/peeling with RUE    Time  12    Period  Weeks    Status  Revised   09/28/18:  met initial goal and upgraded     OT LONG TERM GOAL #3   Title  Pt will perform mod complex home maintenance tasks mod I.    Time  12    Period  Weeks    Status  Achieved   10/25/18:  yardwork     OT LONG TERM GOAL #4   Title  Pt will use RUE as nondominant assist at least 75% of the time for ADLs.---upgraded 09/28/18:  will use RUE 100% of the time for previous ADL/IADL tasks.    Time  8    Period  Weeks    Status  On-going   09/28/18:  met initial goal and upgraded.       OT LONG TERM GOAL #5   Title  Pt will demo at least 90* R shoulder flex for functional reach with min compensation.  09/28/18:  Pt will be able  to retrieve 2-3lb object from overhead shelf with good control x3.      Period  Weeks    Status  Revised   09/28/18:  initial goal met and upgraded     OT LONG TERM GOAL #6   Title   Pt will be able to carry light bag of groceries with RUE.--09/28/18  upgrade to 5lb bag of groceries    Time  12    Period  Weeks    Status  Revised   09/28/18:  met initial goal and upgraded     OT LONG TERM GOAL #7   Title  Pt will demo improved coordination/RUE functional reach as shown by scoring at least 30 on box and blocks test.--09/28/18:  upgrade to 40 blocks    Time  12    Period  Weeks    Status  Achieved   09/28/18:  30 blocks.  10/25/18:  41 blocks     OT LONG TERM GOAL #8   Title  Pt will demo at least 35lbs R grip strength for opening containers/carrying objects.--09/28/18 upgrade to 45lbs     Baseline  22lbs    Time  12    Period  Weeks    Status  Revised   09/28/18:  34lbs     OT LONG TERM GOAL  #9   Baseline  Pt will improve coordination with R dominant UE as shown by completing 9-hole peg test in less than 60sec.  Revised:  10/25/18 to less than 45sec    Time  12    Period  Weeks    Status  Revised   Goal added 09/28/18 (baseline 102.28sec).  10/25/18:  Met & revised 58.60sec           Plan - 11/08/18 1028    Clinical Impression Statement  Pt continues to make good progress with RUE functional use and ADL performance.  Pt reports/demo no significant change from surgery and current POC remains appropriate.    Occupational Profile and client history currently impacting functional performance  Pt was very independent and active prior to surgery.  Pt drove, performed yardwork, most cooking/home maintenance tasks, and enjoyed outdoor leisure activities.  Pt is currently not able to drive, perform yardwork, perform previous cooking/home maintenance tasks or leisure tasks.  Pt's goal is to return to 90-100% of "normal"    Occupational performance deficits (Please refer to evaluation for details):  ADL's;IADL's;Social Participation;Leisure    Rehab Potential  Good    OT Frequency  2x / week    OT Duration  12 weeks    OT Treatment/Interventions  Self-care/ADL  training;Cryotherapy;Paraffin;Therapeutic exercise;DME and/or AE instruction;Functional Mobility Training;Cognitive remediation/compensation;Balance training;Splinting;Manual Therapy;Neuromuscular education;Fluidtherapy;Ultrasound;Electrical Stimulation;Aquatic Therapy;Moist Heat;Energy conservation;Passive range of motion;Therapeutic activities;Patient/family education    Plan  no significant changes from surgery.  Current POC remains appropriate.  Continue with strength/activity tolerance.    OT Home Exercise Plan  Education provided:  edema management, proper positioning of RUE, initial HEP with ball, red putty HEP, coordination HEP    Consulted and Agree with Plan of Care  Patient       Patient will benefit from skilled therapeutic intervention in order to improve the following deficits and impairments:  Abnormal gait, Decreased balance, Decreased endurance, Decreased mobility, Decreased range of motion, Decreased cognition, Decreased activity tolerance, Decreased coordination, Decreased knowledge of use of DME, Decreased strength, Impaired flexibility, Impaired UE functional use  Visit Diagnosis: Hemiplegia and hemiparesis following cerebral infarction affecting right non-dominant side (HCC)  Other  lack of coordination  Abnormal posture  Other abnormalities of gait and mobility  Muscle weakness (generalized)  Unsteadiness on feet    Problem List Patient Active Problem List   Diagnosis Date Noted  . Meningioma ([place]) 10/29/2018  . Other abnormalities of gait and mobility 09/17/2018  . Acute cerebral infarction ([place])   . Thrombocytopenia ([place])   . 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 Acute blood loss anemia   . Generalized OA   . Gastroesophageal reflux disease   . Seizure prophylaxis   . Leukocytosis   . Tumor 08/18/2018  . Meningioma determined by biopsy of brain ([place]) 08/18/2018  . MACULAR DEGENERATION 02/29/2008  . ALLERGY  02/29/2008  . Nonspecific (abnormal) findings on radiological and other examination of body structure 12/26/2007  . NONSPECIFIC ABNORM FIND RAD&OTH EXAM LUNG FIELD 12/26/2007  . GALLSTONES 10/01/2007  . ELEVATION, TRANSAMINASE/LDH LEVELS 09/06/2007  . HEPATITIS NOS 09/01/2007  . ABDOMINAL PAIN, EPIGASTRIC 09/01/2007    [facility] 11/08/2018, 11:31 AM  [facility] [address] [place], [place], [address] Phone: [phone]   Fax:  [phone]  Name: James Olsen MRN: [id number] Date of Birth: 1946-12-06   Vianne Bulls, OTR/L [facility] [address] [place], [place]  [address] [phone] phone [phone] 11/08/18 11:31 AM

## 2018-11-09 ENCOUNTER — Encounter: Payer: Self-pay | Admitting: Physical Medicine & Rehabilitation

## 2018-11-09 ENCOUNTER — Encounter: Payer: Medicare Other | Attending: Physical Medicine & Rehabilitation | Admitting: Physical Medicine & Rehabilitation

## 2018-11-09 VITALS — BP 128/82 | HR 68 | Resp 14 | Ht 69.5 in | Wt 201.0 lb

## 2018-11-09 DIAGNOSIS — R531 Weakness: Secondary | ICD-10-CM | POA: Diagnosis not present

## 2018-11-09 DIAGNOSIS — R4701 Aphasia: Secondary | ICD-10-CM | POA: Diagnosis not present

## 2018-11-09 DIAGNOSIS — I69351 Hemiplegia and hemiparesis following cerebral infarction affecting right dominant side: Secondary | ICD-10-CM | POA: Diagnosis not present

## 2018-11-09 DIAGNOSIS — D320 Benign neoplasm of cerebral meninges: Secondary | ICD-10-CM | POA: Diagnosis present

## 2018-11-09 DIAGNOSIS — H409 Unspecified glaucoma: Secondary | ICD-10-CM | POA: Insufficient documentation

## 2018-11-09 DIAGNOSIS — R350 Frequency of micturition: Secondary | ICD-10-CM | POA: Diagnosis not present

## 2018-11-09 DIAGNOSIS — K219 Gastro-esophageal reflux disease without esophagitis: Secondary | ICD-10-CM | POA: Insufficient documentation

## 2018-11-09 DIAGNOSIS — I639 Cerebral infarction, unspecified: Secondary | ICD-10-CM | POA: Diagnosis present

## 2018-11-09 NOTE — Progress Notes (Signed)
Subjective:    Patient ID: James Olsen, male    DOB: 1946/11/22, 72 y.o.   MRN: [id number]  HPI   James Olsen is here in follow up of his meningioma. He had his cranioplasty on 10/29/18.  It appears that radiation therapy is upcoming.  He is still working with outpatient OT and speech.  Language has improved.  He is independent with all activities at home.  Mood has been good.  Sleep has been functional.  Appetite is near normal.  He remains on Keppra 500 mg twice daily for seizure prophylaxis.  He is able to come off the baclofen as spasticity has improved greatly.  Urinary patterns are back to normal and he is moving his bowels regularly as well now.   Pain Inventory Average Pain 0 Pain Right Now 0 My pain is no pain  In the last 24 hours, has pain interfered with the following? General activity 0 Relation with others 0 Enjoyment of life 0 What TIME of day is your pain at its worst? no pain Sleep (in general) Good  Pain is worse with: no pain Pain improves with: no pain Relief from Meds: no pain  Mobility walk without assistance ability to climb steps?  yes do you drive?  yes Do you have any goals in this area?  no  Function retired Do you have any goals in this area?  no  Neuro/Psych weakness  Prior Studies Any changes since last visit?  no  Physicians involved in your care Any changes since last visit?  no   Family History  Problem Relation Age of Onset  . Diabetes Mother   . Skin cancer Brother   . Congenital heart disease Brother    Social History   Socioeconomic History  . Marital status: Married    Spouse name: Not on file  . Number of children: Not on file  . Years of education: Not on file  . Highest education level: Not on file  Occupational History  . Not on file  Social Needs  . Financial resource strain: Not on file  . Food insecurity:    Worry: Not on file    Inability: Not on file  . Transportation needs:    Medical: Not on  file    Non-medical: Not on file  Tobacco Use  . Smoking status: Never Smoker  . Smokeless tobacco: Never Used  Substance and Sexual Activity  . Alcohol use: Yes    Comment: occasionally  . Drug use: No  . Sexual activity: Not on file  Lifestyle  . Physical activity:    Days per week: Not on file    Minutes per session: Not on file  . Stress: Not on file  Relationships  . Social connections:    Talks on phone: Not on file    Gets together: Not on file    Attends religious service: Not on file    Active member of club or organization: Not on file    Attends meetings of clubs or organizations: Not on file    Relationship status: Not on file  Other Topics Concern  . Not on file  Social History Narrative  . Not on file   Past Surgical History:  Procedure Laterality Date  . CHOLECYSTECTOMY  2008   lap choli  . COLONOSCOPY    . CRANIOPLASTY N/A 10/29/2018   Procedure: CRANIOPLASTY;  Surgeon: [name], MD;  Location: [facility];  Service: Neurosurgery;  Laterality: N/A;  CRANIOPLASTY  .  CRANIOTOMY N/A 08/18/2018   Procedure: CRANIOTOMY TUMOR EXCISION;  Surgeon: [name], MD;  Location: [facility];  Service: Neurosurgery;  Laterality: N/A;  CRANIOTOMY TUMOR EXCISION  . NAILBED REPAIR Left 05/04/2014   Procedure: LEFT INDEX NAIL ABLATION V-Y FLAP COVERAGE;  Surgeon: [name]., MD;  Location: [facility];  Service: Orthopedics;  Laterality: Left;  index  . PILONIDAL CYST EXCISION     x2  . TONSILLECTOMY    . VASECTOMY     Past Medical History:  Diagnosis Date  . Arthritis   . GERD (gastroesophageal reflux disease)   . Glaucoma   . History of blood transfusion     5 units after previous surgery  . Macular degeneration   . Stroke [facility]    after surgery in August 2019 mostly recovered still some weakness in left arm  . Wears glasses    BP 128/82 (BP Location: Right Arm, Patient Position: Sitting, Cuff Size: Normal)   Pulse 68   Resp 14   Ht 5' 9.5"  (1.765 m)   Wt 201 lb (91.2 kg)   SpO2 98%   BMI 29.26 kg/m   Opioid Risk Score:   Fall Risk Score:  `1  Depression screen PHQ 2/9  Depression screen PHQ 2/9 09/13/2018  Decreased Interest 0  Down, Depressed, Hopeless 0  PHQ - 2 Score 0  Altered sleeping 1  Tired, decreased energy 0  Change in appetite 0  Feeling bad or failure about yourself  0  Trouble concentrating 0  Moving slowly or fidgety/restless 0  Suicidal thoughts 0  PHQ-9 Score 1  Difficult doing work/chores Not difficult at all    Review of Systems  Constitutional: Negative.   HENT: Negative.   Eyes: Negative.   Respiratory: Negative.   Cardiovascular: Negative.   Gastrointestinal: Negative.   Endocrine: Negative.   Genitourinary: Negative.   Musculoskeletal: Negative.   Skin: Negative.   Allergic/Immunologic: Negative.   Neurological: Negative.   Hematological: Negative.   Psychiatric/Behavioral: Negative.   All other systems reviewed and are negative.      Objective:   Physical Exam General: No acute distress HEENT: EOMI, oral membranes moist Cards: reg rate  Chest: normal effort Abdomen: Soft, NT, ND Skin: dry, intact Extremities: no edema  Skin: Clean and intact without signs of breakdown Neuro: language fairly fluent. Only has difficulties with more complex sentences and thoughts, but MUCH improved.   Strength is grossly   4+ to 5 out of 5 right upper extremity 4+ to 5 out of 5 right lower extremity and 5 out of 5 in the left upper and lower extremities.  Sensation was intact to pain and light touch in all 4's. Romberg negative.  Musculoskeletal: Full ROM, No pain with AROM or PROM in the neck, trunk, or extremities. Posture appropriate Psych:  pleasant       Assessment & Plan:  1. 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 -continue with outpt OT, SLPto completion 2.  Pain Management: Tylenol as needed 3. Seizure  prophylaxis: Keppra twice daily  -would continue for another 3 months with potential taper during that time. 4. Early extensor tone RLE:  -improved. Off baclofen    -HEP 5.  Urinary frequency: improved frequecy 6.  Loose stool: has resolved.  15 minutes was spent with the patient in direct consultation and examination today.  All issues above were discussed in detail.  I will see him back in about 4  months time for follow-up

## 2018-11-09 NOTE — Therapy (Signed)
[facility] [address] [place], [place], [address] Phone: [phone]   Fax:  [phone]  Speech Language Pathology Treatment  Patient Details  Name: James Olsen MRN: [id number] Date of Birth: [birth year] Referring Provider (SLP): Alger Simons MD   Encounter Date: 11/08/2018  End of Session - 11/09/18 1734    Visit Number  18    Number of Visits  25    Date for SLP Re-Evaluation  12/08/18    SLP Start Time  1104    SLP Stop Time   1145    SLP Time Calculation (min)  41 min    Activity Tolerance  Patient tolerated treatment well       Past Medical History:  Diagnosis Date  . Arthritis   . GERD (gastroesophageal reflux disease)   . Glaucoma   . History of blood transfusion     5 units after previous surgery  . Macular degeneration   . Stroke [facility])    after surgery in August 2019 mostly recovered still some weakness in left arm  . Wears glasses     Past Surgical History:  Procedure Laterality Date  . CHOLECYSTECTOMY  2008   lap choli  . COLONOSCOPY    . CRANIOPLASTY N/A 10/29/2018   Procedure: CRANIOPLASTY;  Surgeon: [name], MD;  Location: [place] [facility];  Service: Neurosurgery;  Laterality: N/A;  CRANIOPLASTY  . CRANIOTOMY N/A 08/18/2018   Procedure: CRANIOTOMY TUMOR EXCISION;  Surgeon: [name], MD;  Location: [facility];  Service: Neurosurgery;  Laterality: N/A;  CRANIOTOMY TUMOR EXCISION  . NAILBED REPAIR Left 05/04/2014   Procedure: LEFT INDEX NAIL ABLATION V-Y FLAP COVERAGE;  Surgeon: [name]., MD;  Location: [facility];  Service: Orthopedics;  Laterality: Left;  index  . PILONIDAL CYST EXCISION     x2  . TONSILLECTOMY    . VASECTOMY      There were no vitals filed for this visit.  Subjective Assessment - 11/08/18 1110    Subjective  Pt reports he has consult with rad oncologist Isidore Moos) scheduled Wednesday 11-10-18. No known intention of rad tx at this time -  just a consult.     Currently in Pain?  No/denies            ADULT SLP TREATMENT - 11/09/18 0001      General Information   Behavior/Cognition  Alert;Cooperative;Pleasant mood      Treatment Provided   Treatment provided  Cognitive-Linquistic      Cognitive-Linquistic Treatment   Treatment focused on  Cognition    Skilled Treatment  "I learned I have a restricted airway or something, so it makes anesthesia interesting." With pt's homework (check writing/checkbook balance), Pt with incorrect transfer of deposit figure to his math work and had incorrect final figure. Req'd SLP min A to find error - pt did not fully correct his work and needed SLP to cue him to look to source material (handout) for the error. In another detailed task (Cross out 3) pt noted with decr'd alternating attention d/t having to cross out clues and keep pencil in the row or column he was looking in, in order to keep his place. .Pt with decr'd emergent awareness of errors during this task.      Assessment / Recommendations / Plan   Plan  Continue with current plan of care   pt cognition looks comparable/without changes     Progression Toward Goals   Progression toward goals  --  pt with deficits commensurate with previous session        SLP Short Term Goals - 10/25/18 1053      SLP SHORT TERM GOAL #1   Title  pt will provide sentence responses 18/20 with WFL speech fluidity over three sessions    Status  Achieved      SLP SHORT TERM GOAL #2   Title  pt will undergo formal cognitive linguistic assessment by visit number 3    Status  Achieved      SLP SHORT TERM GOAL #3   Title  using compensatory strategies pt will functionally participate in 10 minutes simple conversation over 3 sessions    Status  Achieved       SLP Long Term Goals - 11/09/18 1736      SLP LONG TERM GOAL #1   Title  using compensatory strategies pt will functionally participate in 10 minutes mod complex conversation over 3  sessions    Status  Achieved      SLP LONG TERM GOAL #2   Title  pt will functionally perform complex naming tasks with use of compensations over 2 sessions    Status  Achieved      SLP LONG TERM GOAL #3   Title  pt will demo WNL emergent awareness with mod complex tasks in order to acheive 100% success over three sessions    Baseline  beginning week of 11-01-18    Time  4    Period  Weeks   or 25 total sessions, for all LTGs   Status  On-going      SLP LONG TERM GOAL #4   Title  pt will demo WFL alternating attention between 2-3 mod complex cognitive linguistic tasks adequate for 90% success on all tasks    Time  4    Period  Weeks    Status  On-going       Plan - 11/09/18 1734    Clinical Impression Statement  Pt's language in 15 minutes mod complex conversation today cont'd as WFL, two-three aphasic instances noted today -comensurate with conversation prior to reinsertion two weeks ago. Pt cont'd to demo high level attentional/emergent awareness impairments today in simple but detailed written tasks, extra time allowed consistently.  I recommend to continue skilled ST to maximize communication and higher level cognition for independence and QOL.     Speech Therapy Frequency  2x / week    Duration  4 weeks   or 25 total sessions   Treatment/Interventions  SLP instruction and feedback;Compensatory strategies;Functional tasks;Cognitive reorganization;Internal/external aids;Patient/family education;Multimodal communcation approach;Environmental controls;Language facilitation;Cueing hierarchy    Potential to Achieve Goals  Good    Potential Considerations  Severity of impairments       Patient will benefit from skilled therapeutic intervention in order to improve the following deficits and impairments:   Cognitive communication deficit  Aphasia    Problem List Patient Active Problem List   Diagnosis Date Noted  . Meningioma ([place]) 10/29/2018  . Other abnormalities of gait  and mobility 09/17/2018  . Acute cerebral infarction ([place])   . Thrombocytopenia ([place])   . 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 Acute blood loss anemia   . Generalized OA   . Gastroesophageal reflux disease   . Seizure prophylaxis   . Leukocytosis   . Tumor 08/18/2018  . Meningioma determined by biopsy of brain ([place]) 08/18/2018  . MACULAR DEGENERATION 02/29/2008  . ALLERGY 02/29/2008  .  Nonspecific (abnormal) findings on radiological and other examination of body structure 12/26/2007  . NONSPECIFIC ABNORM FIND RAD&OTH EXAM LUNG FIELD 12/26/2007  . GALLSTONES 10/01/2007  . ELEVATION, TRANSAMINASE/LDH LEVELS 09/06/2007  . HEPATITIS NOS 09/01/2007  . ABDOMINAL PAIN, EPIGASTRIC 09/01/2007    [facility] ,[place], CCC-SLP  11/09/2018, 5:37 PM  [facility] [address] [place], [place], [address] Phone: [phone]   Fax:  [phone]   Name: DONTREY SNELLGROVE MRN: [id number] Date of Birth: Mar 09, 1946

## 2018-11-09 NOTE — Patient Instructions (Signed)
  Please complete the assigned speech therapy homework prior to your next session and return it to the speech therapist at your next visit.  

## 2018-11-09 NOTE — Patient Instructions (Signed)
PLEASE FEEL FREE TO CALL OUR OFFICE WITH ANY PROBLEMS OR QUESTIONS ([phone])      NO DRINKING

## 2018-11-10 ENCOUNTER — Encounter: Payer: Self-pay | Admitting: Radiation Oncology

## 2018-11-10 ENCOUNTER — Ambulatory Visit
Admission: RE | Admit: 2018-11-10 | Discharge: 2018-11-10 | Disposition: A | Discharge status: Home / Self Care | Payer: Medicare Other | Source: Ambulatory Visit | Attending: Radiation Oncology | Admitting: Radiation Oncology

## 2018-11-10 ENCOUNTER — Other Ambulatory Visit: Payer: Self-pay

## 2018-11-10 VITALS — BP 135/91 | HR 72 | Temp 97.9°F | Resp 18 | Ht 69.5 in | Wt 201.0 lb

## 2018-11-10 DIAGNOSIS — D329 Benign neoplasm of meninges, unspecified: Secondary | ICD-10-CM

## 2018-11-10 DIAGNOSIS — D420 Neoplasm of uncertain behavior of cerebral meninges: Secondary | ICD-10-CM

## 2018-11-10 NOTE — Progress Notes (Signed)
Radiation Oncology         [phone]) [phone] ________________________________  Initial outpatient Consultation  Name: James Olsen MRN: [id number]  Date: 11/10/2018  DOB: 01-20-1946  CC:James Sacramento, MD  James Pall, MD   REFERRING PHYSICIAN: [name], MD  DIAGNOSIS:    ICD-10-CM   1. 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 male who presented with a growing lump on the top of his head.  His hairdresser recommended he seek medical attention, and he underwent MR Brain on 02-03-18 showing  42 x 39 x 42 mm cauliflower-like mass, epicenter LEFT posterior frontal bone, with intradiploic, extracranial/subgaleal, and intracranial extension. There is adjacent vasogenic edema, and invasion of the superior sagittal sinus. There is restricted diffusion with low ADC, and central flow voids. 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  He ultimately saw James. Christella Olsen.  Imaging prior to surgery on 07-15-18 measure the mass to be larger at 51 X 50 X 36mm with invasion in the skull and the soft tissues of the scalp as before.    On 08/18/18 James Olsen performed craniotomy to resect the tumor. Pathology revealed: Diagnosis 1. Brain, for tumor resection, Intracranial - MENINGIOMA, WHO GRADE 2. - SEE MICROSCOPIC DESCRIPTION. 2. Bone, fragment(s), Skull - MENINGIOMA, WHO GRADE 2. - SEE MICROSCOPIC DESCRIPTION. 3. Bone, fragment(s), Lateral Skull - MENINGIOMA, WHO GRADE 2. - SEE MICROSCOPIC DESCRIPTION. 4. Brain, for tumor resection, Intracranial - MENINGIOMA, WHO GRADE 2. - SEE MICROSCOPIC DESCRIPTION  Residual tumor noted on post op MRI, 3.2 x 2.1 x 1.7 cm along the falx, invading superior sagittal sinus.   Focus of acute ischemia at the inferior cavity margin extending to the ventricle.   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  He's improved greatly under his care, in addition to OT and SLP and PT.  He is on Keppra BID.    On 10/29/18 James Olsen  performed CRANIOPLASTY with custom peek implant.  Past or anticipated interventions, if any, per medical oncology: N/A  Dose of Decadron, if applicable: N/A  Recent neurologic symptoms, if any:   Seizures: No  Headaches: No  Nausea: No  Dizziness/ataxia: No  Difficulty with hand coordination: No  Focal numbness/weakness: He had a stroke after his surgery but right sided weakness is much better. He reports numbness in LE's before tumor was removed, but improved now.   Visual deficits/changes: No  Confusion/Memory deficits: No   SAFETY ISSUES:  Prior radiation? No  Pacemaker/ICD? No  Possible current pregnancy? No  Is the patient on methotrexate? No   PREVIOUS RADIATION THERAPY: No  PAST MEDICAL HISTORY:  has a past medical history of Arthritis, GERD (gastroesophageal reflux disease), Glaucoma, History of blood transfusion, Macular degeneration, Stroke ([place]), and Wears glasses.    PAST SURGICAL HISTORY: Past Surgical History:  Procedure Laterality Date  . CHOLECYSTECTOMY  2008   lap choli  . COLONOSCOPY    . CRANIOPLASTY N/A 10/29/2018   Procedure: CRANIOPLASTY;  Surgeon: James Pall, MD;  Location: [facility];  Service: Neurosurgery;  Laterality: N/A;  CRANIOPLASTY  . CRANIOTOMY N/A 08/18/2018   Procedure: CRANIOTOMY TUMOR EXCISION;  Surgeon: James Pall, MD;  Location: [facility];  Service: Neurosurgery;  Laterality: N/A;  CRANIOTOMY TUMOR EXCISION  .  NAILBED REPAIR Left 05/04/2014   Procedure: LEFT INDEX NAIL ABLATION V-Y FLAP COVERAGE;  Surgeon: [name]., MD;  Location: [facility];  Service: Orthopedics;  Laterality: Left;  index  . PILONIDAL CYST EXCISION     x2  . TONSILLECTOMY    .  VASECTOMY      FAMILY HISTORY: family history includes Congenital heart disease in his brother; Diabetes in his mother; Skin cancer in his brother.  SOCIAL HISTORY:  reports that he has never smoked. He has never used smokeless tobacco. He reports that he drinks alcohol. He reports that he does not use drugs.  ALLERGIES: Patient has no known allergies.  MEDICATIONS:  Current Outpatient Medications  Medication Sig Dispense Refill  . levETIRAcetam (KEPPRA) 500 MG tablet Take 1 tablet (500 mg total) by mouth 2 (two) times daily. 60 tablet 3  . Multiple Vitamins-Minerals (PRESERVISION AREDS 2) CAPS Take 1 capsule by mouth 2 (two) times daily.     . ranitidine (ZANTAC) 150 MG tablet Take 150 mg by mouth daily as needed for heartburn.     No current facility-administered medications for this encounter.     REVIEW OF SYSTEMS:  A 10+ POINT REVIEW OF SYSTEMS WAS OBTAINED including neurology, dermatology, psychiatry, cardiac, respiratory, lymph, extremities, GI, GU, Musculoskeletal, constitutional, breasts, reproductive, HEENT.  All pertinent positives are noted in the HPI.  All others are negative.   PHYSICAL EXAM:  height is 5' 9.5" (1.765 m) and weight is 201 lb (91.2 kg). His oral temperature is 97.9 F (36.6 C). His blood pressure is 135/91 (abnormal) and his pulse is 72. His respiration is 18 and oxygen saturation is 100%.   General: Alert and oriented, in no acute distress HEENT: Scalp healing from recent cranioplasty with scabbing along incision.  Extraocular movements are intact. Oropharynx is clear. Neck: Neck is supple, no palpable cervical or supraclavicular lymphadenopathy. Heart: Regular in rate and rhythm with no murmurs, rubs, or gallops. Chest: Clear to auscultation bilaterally, with no rhonchi, wheezes, or rales. Abdomen: Soft, nontender, nondistended, with no rigidity or guarding. Extremities: No cyanosis or edema. Lymphatics: see Neck Exam Skin: No concerning  lesions. Musculoskeletal: mild weakness in right extremities compared to left Neurologic: Cranial nerves II through XII are grossly intact. No obvious focalities. Speech is grossly fluent. Coordination is intact. 3/3 object recall at three minutes.   Psychiatric: Judgment and insight are intact. Affect is appropriate.   KPS = 90 100 - Normal; no complaints; no evidence of disease. 90   - Able to carry on normal activity; minor signs or symptoms of disease. 80   - Normal activity with effort; some signs or symptoms of disease. 47   - Cares for self; unable to carry on normal activity or to do active work. 60   - Requires occasional assistance, but is able to care for most of his personal needs. 50   - Requires considerable assistance and frequent medical care. 45   - Disabled; requires special care and assistance. 60   - Severely disabled; hospital admission is indicated although death not imminent. 36   - Very sick; hospital admission necessary; active supportive treatment necessary. 10   - Moribund; fatal processes progressing rapidly. 0     - Dead  Karnofsky DA, Abelmann WH, Craver LS and Burchenal [facility] [phone]) The use of the nitrogen mustards in the palliative treatment of carcinoma: with particular reference to bronchogenic carcinoma Cancer 1 634-56  LABORATORY DATA:  Lab Results  Component Value Date   WBC 6.3 10/22/2018   HGB 13.5 10/22/2018   HCT 43.5 10/22/2018   MCV 93.1 10/22/2018   PLT 171 10/22/2018   CMP     Component Value Date/Time   NA 141 08/30/2018 0524   K 3.7 08/30/2018 0524   CL 107 08/30/2018 0524   CO2 26 08/30/2018 0524   GLUCOSE 116 (H) 08/30/2018 0524   BUN 16 08/30/2018 0524   CREATININE 0.86 08/30/2018 0524   CALCIUM 8.5 (L) 08/30/2018 0524   PROT 5.5 (L) 08/25/2018 0928   ALBUMIN 2.9 (L) 08/25/2018 0928   AST 22 08/25/2018 0928   ALT 45 (H) 08/25/2018 0928   ALKPHOS 51 08/25/2018 0928   BILITOT 0.8 08/25/2018 0928   GFRNONAA >60 08/30/2018 0524    GFRAA >60 08/30/2018 0524        RADIOGRAPHY:  As above   IMPRESSION/PLAN:This is a very nice man with Grade 2 meningioma, residual disease post-operatively. I talked to the patient about the findings and work-up thus far. We discussed the patient's diagnosis of Grade II Meningioma, incompletely resected, invading bone, and general treatment for this, highlighting the role of radiotherapy in the management. We discussed the available radiation techniques, and focused on the details of logistics and delivery.    We discussed the risks, benefits, and side effects of radiotherapy. Side effects may include but not necessarily be limited to: skin irritation, fatigue, nausea, seizure, HA, neurological decline, brain injury, hair loss. No guarantees of treatment were given. A consent form was signed and placed in the patient's medical record. The patient was encouraged to ask questions that I answered to the best of my ability.   I asked Mont Dutton, patient navigator, to please arrange an up to date 3T MRI at patient's convenience, followed by tumor board presentation, and then CT simulation (no contrast) for EBRT.   Also, Manuela Schwartz will arrange a neurology consult w/ James. Mickeal Skinner.  Patient is interested in seeing him for medication management (ie long term anti-convulsant management) and symptom management over time.  __________________________________________   Eppie Gibson, MD

## 2018-11-11 ENCOUNTER — Ambulatory Visit: Payer: Medicare Other | Admitting: Occupational Therapy

## 2018-11-11 ENCOUNTER — Encounter: Payer: Self-pay | Admitting: Occupational Therapy

## 2018-11-11 ENCOUNTER — Ambulatory Visit: Payer: Medicare Other | Admitting: Speech Pathology

## 2018-11-11 ENCOUNTER — Other Ambulatory Visit: Payer: Self-pay | Admitting: Radiation Therapy

## 2018-11-11 DIAGNOSIS — R293 Abnormal posture: Secondary | ICD-10-CM

## 2018-11-11 DIAGNOSIS — I69353 Hemiplegia and hemiparesis following cerebral infarction affecting right non-dominant side: Secondary | ICD-10-CM

## 2018-11-11 DIAGNOSIS — M6281 Muscle weakness (generalized): Secondary | ICD-10-CM

## 2018-11-11 DIAGNOSIS — R2689 Other abnormalities of gait and mobility: Secondary | ICD-10-CM

## 2018-11-11 DIAGNOSIS — R278 Other lack of coordination: Secondary | ICD-10-CM

## 2018-11-11 DIAGNOSIS — R4701 Aphasia: Secondary | ICD-10-CM

## 2018-11-11 DIAGNOSIS — D329 Benign neoplasm of meninges, unspecified: Secondary | ICD-10-CM

## 2018-11-11 DIAGNOSIS — R2681 Unsteadiness on feet: Secondary | ICD-10-CM

## 2018-11-11 DIAGNOSIS — R41841 Cognitive communication deficit: Secondary | ICD-10-CM

## 2018-11-11 NOTE — Therapy (Signed)
[facility] [address] [place], [place], [address] Phone: [phone]   Fax:  [phone]  Occupational Therapy Treatment  Patient Details  Name: James Olsen MRN: [id number] Date of Birth: May 19, 1946 No data recorded  Encounter Date: 11/11/2018  OT End of Session - 11/11/18 1332    Visit Number  18    Number of Visits  25    Date for OT Re-Evaluation  12/06/18    Authorization Type  cert. 09/07/18-12/06/18    Authorization Time Period  Medicare & [employer], covered 100%    Authorization - Visit Number  71    Authorization - Number of Visits  20    OT Start Time  1320    OT Stop Time  1400    OT Time Calculation (min)  40 min    Activity Tolerance  Patient tolerated treatment well    Behavior During Therapy  WFL for tasks assessed/performed       Past Medical History:  Diagnosis Date  . Arthritis   . GERD (gastroesophageal reflux disease)   . Glaucoma    pt denies.   . History of blood transfusion     5 units after previous surgery  . Macular degeneration   . Stroke  [facility])    after surgery in August 2019 mostly recovered still some weakness in left arm  . Wears glasses     Past Surgical History:  Procedure Laterality Date  . CHOLECYSTECTOMY  2008   lap choli  . COLONOSCOPY    . CRANIOPLASTY N/A 10/29/2018   Procedure: CRANIOPLASTY;  Surgeon: [name], MD;  Location: [facility];  Service: Neurosurgery;  Laterality: N/A;  CRANIOPLASTY  . CRANIOTOMY N/A 08/18/2018   Procedure: CRANIOTOMY TUMOR EXCISION;  Surgeon: [name], MD;  Location: [facility];  Service: Neurosurgery;  Laterality: N/A;  CRANIOTOMY TUMOR EXCISION  . NAILBED REPAIR Left 05/04/2014   Procedure: LEFT INDEX NAIL ABLATION V-Y FLAP COVERAGE;  Surgeon: [name]., MD;  Location: [facility];  Service: Orthopedics;  Laterality: Left;  index  . PILONIDAL CYST EXCISION     x2  . TONSILLECTOMY    . VASECTOMY      There  were no vitals filed for this visit.  Subjective Assessment - 11/11/18 1323    Subjective   Pt saw Dr. Naaman Plummer, Dr. Cyndy Freeze (will decr Keppra), and will start radiation and have radiation for 6 weeks.     Pertinent History  Meningioma s/p craniotomy for tumor resection 08/18/18, Acute cerebral infarction; arthritis, macular degeneration, hepatitis, GERD    Patient Stated Goals  be back to normal or 95%    Currently in Pain?  No/denies        In quadruped, forward/backward wt. Shifts, alternating UE lifts, cat/cow positions for incr core/scapular stability and incr activity tolerance.  In tall kneeling, shoulder flex with ball with BUEs for closed chain for incr core strength and activity tolerance.  In standing, functional reaching to place small pegs in vertical pegboard to copy design with min difficulty.  Removing using in-hand manipulation with min difficulty with coordination/tremor.  Sitting, tossing/catching small ball with R hand.  Then tossing/catching with therapist using RUE.  Pt with min difficulty/drops, but improved with repetition and min cueing.  Discussed multiple MD appts this week including changes to treatment (see subjective).       OT Short Term Goals - 09/28/18 0753      OT SHORT TERM GOAL #1  Title  Pt will be independent with initial HEP.--check STG 10/22/18    Time  6    Period  Weeks    Status  Achieved      OT SHORT TERM GOAL #2   Title  Pt will be able to bathe mod I.    Time  6    Period  Weeks    Status  Achieved      OT SHORT TERM GOAL #3   Title  Pt will be able to complete clothing fasteners mod I.    Time  6    Period  Weeks    Status  Achieved   09/28/18:  incr time     OT SHORT TERM GOAL #4   Title  Pt will be able to cut food mod I.    Time  6    Period  Weeks    Status  Achieved   09/28/18:  able to perform with fork/knife but no chopping/peeling     OT SHORT TERM GOAL #5   Title  Pt will demo at least 60* R shoulder flex with  only min compensation for improved functional reach.    Baseline  45* with mod compensation    Period  Weeks    Status  Achieved   09/28/18:  WNL     OT SHORT TERM GOAL #6   Title  Pt will be able to use RUE as stabilizer consistently for ADLs.    Time  6    Period  Weeks    Status  Achieved        OT Long Term Goals - 11/08/18 1032      OT LONG TERM GOAL #1   Title  Pt will be independent with updated HEP.--check LTGs 12/06/18    Time  12    Period  Weeks    Status  On-going      OT LONG TERM GOAL #2   Title  Pt will perform simple cooking tasks mod I.--09/28/18 upgraded to mod complex cooking involving carrying pots/pans and chopping/peeling with RUE    Time  12    Period  Weeks    Status  Revised   09/28/18:  met initial goal and upgraded     OT LONG TERM GOAL #3   Title  Pt will perform mod complex home maintenance tasks mod I.    Time  12    Period  Weeks    Status  Achieved   10/25/18:  yardwork     OT LONG TERM GOAL #4   Title  Pt will use RUE as nondominant assist at least 75% of the time for ADLs.---upgraded 09/28/18:  will use RUE 100% of the time for previous ADL/IADL tasks.    Time  8    Period  Weeks    Status  On-going   09/28/18:  met initial goal and upgraded.       OT LONG TERM GOAL #5   Title  Pt will demo at least 90* R shoulder flex for functional reach with min compensation.  09/28/18:  Pt will be able to retrieve 2-3lb object from overhead shelf with good control x3.      Period  Weeks    Status  Revised   09/28/18:  initial goal met and upgraded     OT LONG TERM GOAL #6   Title  Pt will be able to carry light bag of groceries with RUE.--09/28/18  upgrade to 5lb bag  of groceries    Time  12    Period  Weeks    Status  Revised   09/28/18:  met initial goal and upgraded     OT LONG TERM GOAL #7   Title  Pt will demo improved coordination/RUE functional reach as shown by scoring at least 30 on box and blocks test.--09/28/18:  upgrade to 40 blocks     Time  12    Period  Weeks    Status  Achieved   09/28/18:  30 blocks.  10/25/18:  41 blocks     OT LONG TERM GOAL #8   Title  Pt will demo at least 35lbs R grip strength for opening containers/carrying objects.--09/28/18 upgrade to 45lbs     Baseline  22lbs    Time  12    Period  Weeks    Status  Revised   09/28/18:  34lbs     OT LONG TERM GOAL  #9   Baseline  Pt will improve coordination with R dominant UE as shown by completing 9-hole peg test in less than 60sec.  Revised:  10/25/18 to less than 45sec    Time  12    Period  Weeks    Status  Revised   Goal added 09/28/18 (baseline 102.28sec).  10/25/18:  Met & revised 58.60sec           Plan - 11/11/18 1333    Clinical Impression Statement  Pt is progressing with incr activity tolerance.  Pt reports that he may begin radiation after completion of therapy.    Occupational Profile and client history currently impacting functional performance  Pt was very independent and active prior to surgery.  Pt drove, performed yardwork, most cooking/home maintenance tasks, and enjoyed outdoor leisure activities.  Pt is currently not able to drive, perform yardwork, perform previous cooking/home maintenance tasks or leisure tasks.  Pt's goal is to return to 90-100% of "normal"    Occupational performance deficits (Please refer to evaluation for details):  ADL's;IADL's;Social Participation;Leisure    Rehab Potential  Good    OT Frequency  2x / week    OT Duration  12 weeks    OT Treatment/Interventions  Self-care/ADL training;Cryotherapy;Paraffin;Therapeutic exercise;DME and/or AE instruction;Functional Mobility Training;Cognitive remediation/compensation;Balance training;Splinting;Manual Therapy;Neuromuscular education;Fluidtherapy;Ultrasound;Electrical Stimulation;Aquatic Therapy;Moist Heat;Energy conservation;Passive range of motion;Therapeutic activities;Patient/family education    Plan  Continue with strength/activity tolerance and LUE  coordination/control    OT Home Exercise Plan  Education provided:  edema management, proper positioning of RUE, initial HEP with ball, red putty HEP, coordination HEP    Consulted and Agree with Plan of Care  Patient       Patient will benefit from skilled therapeutic intervention in order to improve the following deficits and impairments:  Abnormal gait, Decreased balance, Decreased endurance, Decreased mobility, Decreased range of motion, Decreased cognition, Decreased activity tolerance, Decreased coordination, Decreased knowledge of use of DME, Decreased strength, Impaired flexibility, Impaired UE functional use  Visit Diagnosis: Hemiplegia and hemiparesis following cerebral infarction affecting right non-dominant side (HCC)  Other lack of coordination  Abnormal posture  Other abnormalities of gait and mobility  Muscle weakness (generalized)  Unsteadiness on feet    Problem List Patient Active Problem List   Diagnosis Date Noted  . Meningioma ([place]) 10/29/2018  . Other abnormalities of gait and mobility 09/17/2018  . Acute cerebral infarction ([place])   . Thrombocytopenia ([place])   . 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 Acute  blood loss anemia   . Generalized OA   . Gastroesophageal reflux disease   . Seizure prophylaxis   . Leukocytosis   . Tumor 08/18/2018  . Meningioma determined by biopsy of brain ([place]) 08/18/2018  . MACULAR DEGENERATION 02/29/2008  . ALLERGY 02/29/2008  . Nonspecific (abnormal) findings on radiological and other examination of body structure 12/26/2007  . NONSPECIFIC ABNORM FIND RAD&OTH EXAM LUNG FIELD 12/26/2007  . GALLSTONES 10/01/2007  . ELEVATION, TRANSAMINASE/LDH LEVELS 09/06/2007  . HEPATITIS NOS 09/01/2007  . ABDOMINAL PAIN, EPIGASTRIC 09/01/2007    [facility] 11/11/2018, 1:35 PM  [facility] [address] [place], [place],  [address] Phone: [phone]   Fax:  [phone]  Name: JAVIEN TESCH MRN: [id number] Date of Birth: 02-20-1946   Vianne Bulls, OTR/L [facility] [address] [place], [place]  [address] [phone] phone [phone] 11/11/18 1:44 PM

## 2018-11-11 NOTE — Therapy (Signed)
[facility] [address] [place], [place], [address] Phone: [phone]   Fax:  [phone]  Speech Language Pathology Treatment  Patient Details  Name: James Olsen MRN: [id number] Date of Birth: September 26, 1946 Referring Provider (SLP): Alger Simons MD   Encounter Date: 11/11/2018  End of Session - 11/11/18 1728    Visit Number  19    Number of Visits  25    Date for SLP Re-Evaluation  12/08/18    SLP Start Time  1287    SLP Stop Time   1446    SLP Time Calculation (min)  43 min    Activity Tolerance  Patient tolerated treatment well       Past Medical History:  Diagnosis Date  . Arthritis   . GERD (gastroesophageal reflux disease)   . Glaucoma    pt denies.   . History of blood transfusion     5 units after previous surgery  . Macular degeneration   . Stroke [facility])    after surgery in August 2019 mostly recovered still some weakness in left arm  . Wears glasses     Past Surgical History:  Procedure Laterality Date  . CHOLECYSTECTOMY  2008   lap choli  . COLONOSCOPY    . CRANIOPLASTY N/A 10/29/2018   Procedure: CRANIOPLASTY;  Surgeon: [name], MD;  Location: [facility];  Service: Neurosurgery;  Laterality: N/A;  CRANIOPLASTY  . CRANIOTOMY N/A 08/18/2018   Procedure: CRANIOTOMY TUMOR EXCISION;  Surgeon: [name], MD;  Location: [facility];  Service: Neurosurgery;  Laterality: N/A;  CRANIOTOMY TUMOR EXCISION  . NAILBED REPAIR Left 05/04/2014   Procedure: LEFT INDEX NAIL ABLATION V-Y FLAP COVERAGE;  Surgeon: [name]., MD;  Location: [facility];  Service: Orthopedics;  Laterality: Left;  index  . PILONIDAL CYST EXCISION     x2  . TONSILLECTOMY    . VASECTOMY      There were no vitals filed for this visit.  Subjective Assessment - 11/11/18 1406    Subjective  "I'm going to start radiation."    Currently in Pain?  No/denies            ADULT SLP TREATMENT - 11/11/18  1403      General Information   Behavior/Cognition  Alert;Cooperative;Pleasant mood      Treatment Provided   Treatment provided  Cognitive-Linquistic      Pain Assessment   Pain Assessment  No/denies pain      Cognitive-Linquistic Treatment   Treatment focused on  Cognition    Skilled Treatment  Homework was 87% accurate. Pt double checked, extended time, and required mod SLP A to find error. Pt reports awakening to sound of a malfunctioning smoke alarm in the middle of the night. He stated he initially had trouble getting his words out when he was startled, but was able to slow down and communicate with his wife. Pt told SLP about his hobby of model trains; he reports he has not been as engaged in this activity recently. SLP encouraged pt to spend time using his track planning app at home to create new track plans. Pt told SLP he was looking forward to continuing cross-out task begun in previous session. SLP gave pt a different version of this task (Cross out 2); decreased alternating attention and attention to detail, with occasional min question cues required for awareness.       Assessment / Recommendations / Plan   Plan  Continue with current plan of care   cognition appears similar to previous sessions     Progression Toward Goals   Progression toward goals  --   pt deficits similar to previous sessions        SLP Short Term Goals - 10/25/18 1053      SLP SHORT TERM GOAL #1   Title  pt will provide sentence responses 18/20 with WFL speech fluidity over three sessions    Status  Achieved      SLP SHORT TERM GOAL #2   Title  pt will undergo formal cognitive linguistic assessment by visit number 3    Status  Achieved      SLP SHORT TERM GOAL #3   Title  using compensatory strategies pt will functionally participate in 10 minutes simple conversation over 3 sessions    Status  Achieved       SLP Long Term Goals - 11/11/18 1748      SLP LONG TERM GOAL #1   Title  using  compensatory strategies pt will functionally participate in 10 minutes mod complex conversation over 3 sessions    Status  Achieved      SLP LONG TERM GOAL #2   Title  pt will functionally perform complex naming tasks with use of compensations over 2 sessions    Status  Achieved      SLP LONG TERM GOAL #3   Title  pt will demo WNL emergent awareness with mod complex tasks in order to acheive 100% success over three sessions    Time  4    Period  Weeks   or 25 sessions for all LTGs   Status  On-going      SLP LONG TERM GOAL #4   Title  pt will demo WFL alternating attention between 2-3 mod complex cognitive linguistic tasks adequate for 90% success on all tasks    Time  4    Period  Weeks    Status  On-going       Plan - 11/11/18 1747    Clinical Impression Statement  Pt's language in 15 minutes mod complex conversation today cont'd as WFL, comensurate with conversation prior to reinsertion two weeks ago. Pt cont'd to demo high level attentional/emergent awareness impairments today in simple but detailed written tasks, extra time allowed consistently.  I recommend to continue skilled ST to maximize communication and higher level cognition for independence and QOL.     Speech Therapy Frequency  2x / week    Duration  4 weeks   or 25 total sessions   Treatment/Interventions  SLP instruction and feedback;Compensatory strategies;Functional tasks;Cognitive reorganization;Internal/external aids;Patient/family education;Multimodal communcation approach;Environmental controls;Language facilitation;Cueing hierarchy    Potential to Achieve Goals  Good    Potential Considerations  Severity of impairments       Patient will benefit from skilled therapeutic intervention in order to improve the following deficits and impairments:   Cognitive communication deficit  Aphasia    Problem List Patient Active Problem List   Diagnosis Date Noted  . Meningioma ([place]) 10/29/2018  . Other  abnormalities of gait and mobility 09/17/2018  . Acute cerebral infarction ([place])   . Thrombocytopenia ([place])   . 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 Acute blood loss anemia   . Generalized OA   . Gastroesophageal reflux disease   . Seizure prophylaxis   . Leukocytosis   . Tumor 08/18/2018  . 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 MACULAR DEGENERATION 02/29/2008  . ALLERGY 02/29/2008  . Nonspecific (abnormal) findings on radiological and other examination of body structure 12/26/2007  . NONSPECIFIC ABNORM FIND RAD&OTH EXAM LUNG FIELD 12/26/2007  . GALLSTONES 10/01/2007  . ELEVATION, TRANSAMINASE/LDH LEVELS 09/06/2007  . HEPATITIS NOS 09/01/2007  . ABDOMINAL PAIN, EPIGASTRIC 09/01/2007   Deneise Lever, [place], [place] Speech-Language Pathologist   Aliene Altes 11/11/2018, 5:51 PM  [facility] [address] [place], [place], [address] Phone: [phone]   Fax:  [phone]   Name: NYCERE PRESLEY MRN: [id number] Date of Birth: November 19, 1946

## 2018-11-11 NOTE — Patient Instructions (Signed)
  Use your [employer] app to make a new track plan. If you can print it, bring it with you next time.

## 2018-11-13 ENCOUNTER — Encounter: Payer: Self-pay | Admitting: Radiation Oncology

## 2018-11-13 DIAGNOSIS — D420 Neoplasm of uncertain behavior of cerebral meninges: Secondary | ICD-10-CM | POA: Insufficient documentation

## 2018-11-15 ENCOUNTER — Ambulatory Visit: Payer: Medicare Other | Admitting: Speech Pathology

## 2018-11-15 ENCOUNTER — Encounter: Payer: Self-pay | Admitting: Occupational Therapy

## 2018-11-15 ENCOUNTER — Ambulatory Visit: Payer: Medicare Other | Admitting: Occupational Therapy

## 2018-11-15 DIAGNOSIS — M6281 Muscle weakness (generalized): Secondary | ICD-10-CM

## 2018-11-15 DIAGNOSIS — I69353 Hemiplegia and hemiparesis following cerebral infarction affecting right non-dominant side: Secondary | ICD-10-CM | POA: Diagnosis not present

## 2018-11-15 DIAGNOSIS — R293 Abnormal posture: Secondary | ICD-10-CM

## 2018-11-15 DIAGNOSIS — R278 Other lack of coordination: Secondary | ICD-10-CM

## 2018-11-15 DIAGNOSIS — R41841 Cognitive communication deficit: Secondary | ICD-10-CM

## 2018-11-15 DIAGNOSIS — R2681 Unsteadiness on feet: Secondary | ICD-10-CM

## 2018-11-15 DIAGNOSIS — R2689 Other abnormalities of gait and mobility: Secondary | ICD-10-CM

## 2018-11-15 NOTE — Patient Instructions (Signed)
Make a 3D version of your track

## 2018-11-15 NOTE — Therapy (Signed)
[facility] [address] [place], [place], [address] Phone: [phone]   Fax:  [phone]  Occupational Therapy Treatment  Patient Details  Name: James Olsen MRN: [id number] Date of Birth: Jan 07, 1946 No data recorded  Encounter Date: 11/15/2018  OT End of Session - 11/15/18 1110    Visit Number  19    Number of Visits  25    Date for OT Re-Evaluation  12/06/18    Authorization Type  cert. 09/07/18-12/06/18    Authorization Time Period  Medicare & [employer], covered 100%    Authorization - Visit Number  74    Authorization - Number of Visits  20    OT Start Time  1106    OT Stop Time  1145    OT Time Calculation (min)  39 min    Activity Tolerance  Patient tolerated treatment well    Behavior During Therapy  WFL for tasks assessed/performed       Past Medical History:  Diagnosis Date  . Arthritis   . GERD (gastroesophageal reflux disease)   . Glaucoma    pt denies.   . History of blood transfusion     5 units after previous surgery  . Macular degeneration   . Stroke [facility])    after surgery in August 2019 mostly recovered still some weakness in left arm  . Wears glasses     Past Surgical History:  Procedure Laterality Date  . CHOLECYSTECTOMY  2008   lap choli  . COLONOSCOPY    . CRANIOPLASTY N/A 10/29/2018   Procedure: CRANIOPLASTY;  Surgeon: [name], MD;  Location: [facility];  Service: Neurosurgery;  Laterality: N/A;  CRANIOPLASTY  . CRANIOTOMY N/A 08/18/2018   Procedure: CRANIOTOMY TUMOR EXCISION;  Surgeon: [name], MD;  Location: [facility];  Service: Neurosurgery;  Laterality: N/A;  CRANIOTOMY TUMOR EXCISION  . NAILBED REPAIR Left 05/04/2014   Procedure: LEFT INDEX NAIL ABLATION V-Y FLAP COVERAGE;  Surgeon: [name]., MD;  Location: [facility];  Service: Orthopedics;  Laterality: Left;  index  . PILONIDAL CYST EXCISION     x2  . TONSILLECTOMY    . VASECTOMY      There  were no vitals filed for this visit.  Subjective Assessment - 11/15/18 1107    Subjective   MRI and CT scheduled in prep for radiation    Pertinent History  Meningioma s/p craniotomy for tumor resection 08/18/18, Acute cerebral infarction; arthritis, macular degeneration, hepatitis, GERD    Patient Stated Goals  be back to normal or 95%    Currently in Pain?  No/denies          Picking up blocks using gripper set on level 2 to place in container at midlevel reaching for incr strength and endurance.  Pt with mod difficulty/drops due to fatigue/tremors.   In quadruped, forward/backward wt. Shifts, cat/cow positions for incr core/scapular stability and incr activity tolerance.  In prone, wt. Bearing on elbows with head/chest lift  incr scapular strength and activity tolerance with min cueing for head position.  Then with alternating UE reaching with min cues.  In sitting, closed chain shoulder flex and chest press with scapular retraction with 2lb weighted ball with min-mod cueing and mirror for normal movement patterns.  In standing, RUE functional reaching to place/remove clothespins with 1-8lb resistance on vertical pole while standing on foam and lateral reaching for incr balance/strength/activity tolerance in prep for IADLs.  OT Short Term Goals - 09/28/18 0753      OT SHORT TERM GOAL #1   Title  Pt will be independent with initial HEP.--check STG 10/22/18    Time  6    Period  Weeks    Status  Achieved      OT SHORT TERM GOAL #2   Title  Pt will be able to bathe mod I.    Time  6    Period  Weeks    Status  Achieved      OT SHORT TERM GOAL #3   Title  Pt will be able to complete clothing fasteners mod I.    Time  6    Period  Weeks    Status  Achieved   09/28/18:  incr time     OT SHORT TERM GOAL #4   Title  Pt will be able to cut food mod I.    Time  6    Period  Weeks    Status  Achieved   09/28/18:  able to perform with fork/knife but no chopping/peeling      OT SHORT TERM GOAL #5   Title  Pt will demo at least 60* R shoulder flex with only min compensation for improved functional reach.    Baseline  45* with mod compensation    Period  Weeks    Status  Achieved   09/28/18:  WNL     OT SHORT TERM GOAL #6   Title  Pt will be able to use RUE as stabilizer consistently for ADLs.    Time  6    Period  Weeks    Status  Achieved        OT Long Term Goals - 11/08/18 1032      OT LONG TERM GOAL #1   Title  Pt will be independent with updated HEP.--check LTGs 12/06/18    Time  12    Period  Weeks    Status  On-going      OT LONG TERM GOAL #2   Title  Pt will perform simple cooking tasks mod I.--09/28/18 upgraded to mod complex cooking involving carrying pots/pans and chopping/peeling with RUE    Time  12    Period  Weeks    Status  Revised   09/28/18:  met initial goal and upgraded     OT LONG TERM GOAL #3   Title  Pt will perform mod complex home maintenance tasks mod I.    Time  12    Period  Weeks    Status  Achieved   10/25/18:  yardwork     OT LONG TERM GOAL #4   Title  Pt will use RUE as nondominant assist at least 75% of the time for ADLs.---upgraded 09/28/18:  will use RUE 100% of the time for previous ADL/IADL tasks.    Time  8    Period  Weeks    Status  On-going   09/28/18:  met initial goal and upgraded.       OT LONG TERM GOAL #5   Title  Pt will demo at least 90* R shoulder flex for functional reach with min compensation.  09/28/18:  Pt will be able to retrieve 2-3lb object from overhead shelf with good control x3.      Period  Weeks    Status  Revised   09/28/18:  initial goal met and upgraded     OT LONG TERM GOAL #6  Title  Pt will be able to carry light bag of groceries with RUE.--09/28/18  upgrade to 5lb bag of groceries    Time  12    Period  Weeks    Status  Revised   09/28/18:  met initial goal and upgraded     OT LONG TERM GOAL #7   Title  Pt will demo improved coordination/RUE functional reach as shown by  scoring at least 30 on box and blocks test.--09/28/18:  upgrade to 40 blocks    Time  12    Period  Weeks    Status  Achieved   09/28/18:  30 blocks.  10/25/18:  41 blocks     OT LONG TERM GOAL #8   Title  Pt will demo at least 35lbs R grip strength for opening containers/carrying objects.--09/28/18 upgrade to 45lbs     Baseline  22lbs    Time  12    Period  Weeks    Status  Revised   09/28/18:  34lbs     OT LONG TERM GOAL  #9   Baseline  Pt will improve coordination with R dominant UE as shown by completing 9-hole peg test in less than 60sec.  Revised:  10/25/18 to less than 45sec    Time  12    Period  Weeks    Status  Revised   Goal added 09/28/18 (baseline 102.28sec).  10/25/18:  Met & revised 58.60sec           Plan - 11/15/18 1141    Clinical Impression Statement  Pt is progressing with incr activity tolerance and RUE strength.      Occupational Profile and client history currently impacting functional performance  Pt was very independent and active prior to surgery.  Pt drove, performed yardwork, most cooking/home maintenance tasks, and enjoyed outdoor leisure activities.  Pt is currently not able to drive, perform yardwork, perform previous cooking/home maintenance tasks or leisure tasks.  Pt's goal is to return to 90-100% of "normal"    Occupational performance deficits (Please refer to evaluation for details):  ADL's;IADL's;Social Participation;Leisure    Rehab Potential  Good    OT Frequency  2x / week    OT Duration  12 weeks    OT Treatment/Interventions  Self-care/ADL training;Cryotherapy;Paraffin;Therapeutic exercise;DME and/or AE instruction;Functional Mobility Training;Cognitive remediation/compensation;Balance training;Splinting;Manual Therapy;Neuromuscular education;Fluidtherapy;Ultrasound;Electrical Stimulation;Aquatic Therapy;Moist Heat;Energy conservation;Passive range of motion;Therapeutic activities;Patient/family education    Plan  Continue with strength/activity  tolerance and LUE coordination/control    OT Home Exercise Plan  Education provided:  edema management, proper positioning of RUE, initial HEP with ball, red putty HEP, coordination HEP    Consulted and Agree with Plan of Care  Patient       Patient will benefit from skilled therapeutic intervention in order to improve the following deficits and impairments:  Abnormal gait, Decreased balance, Decreased endurance, Decreased mobility, Decreased range of motion, Decreased cognition, Decreased activity tolerance, Decreased coordination, Decreased knowledge of use of DME, Decreased strength, Impaired flexibility, Impaired UE functional use  Visit Diagnosis: Hemiplegia and hemiparesis following cerebral infarction affecting right non-dominant side (HCC)  Other lack of coordination  Abnormal posture  Other abnormalities of gait and mobility  Muscle weakness (generalized)  Unsteadiness on feet    Problem List Patient Active Problem List   Diagnosis Date Noted  . 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 Meningioma ([place]) 10/29/2018  . Other abnormalities of gait and mobility 09/17/2018  . Acute cerebral infarction ([place])   . Thrombocytopenia (  [place])   . 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 Acute blood loss anemia   . Generalized OA   . Gastroesophageal reflux disease   . Seizure prophylaxis   . Leukocytosis   . Tumor 08/18/2018  . Meningioma determined by biopsy of brain ([place]) 08/18/2018  . MACULAR DEGENERATION 02/29/2008  . ALLERGY 02/29/2008  . Nonspecific (abnormal) findings on radiological and other examination of body structure 12/26/2007  . NONSPECIFIC ABNORM FIND RAD&OTH EXAM LUNG FIELD 12/26/2007  . GALLSTONES 10/01/2007  . ELEVATION, TRANSAMINASE/LDH LEVELS 09/06/2007  . HEPATITIS NOS 09/01/2007  . ABDOMINAL PAIN, EPIGASTRIC 09/01/2007    [facility] 11/15/2018, 11:42 AM  [facility] [address] [place], [place], [address] Phone: [phone]   Fax:  [phone]  Name: James Olsen MRN: [id number] Date of Birth: 10-16-1946   Vianne Bulls, OTR/L [facility] [address] [place], [place]  [address] [phone] phone [phone] 11/15/18 12:54 PM

## 2018-11-17 NOTE — Therapy (Signed)
[facility] [address] [place], [place], [address] Phone: [phone]   Fax:  [phone]  Speech Language Pathology Treatment and Progress Note  Patient Details  Name: James Olsen MRN: [id number] Date of Birth: August 25, 1946 Referring Provider (SLP): Alger Simons MD   Encounter Date: 11/15/2018  End of Session - 11/15/18 1147    Visit Number  20    Number of Visits  25    Date for SLP Re-Evaluation  12/08/18    SLP Start Time  1147    SLP Stop Time   1230    SLP Time Calculation (min)  43 min    Activity Tolerance  Patient tolerated treatment well       Past Medical History:  Diagnosis Date  . Arthritis   . GERD (gastroesophageal reflux disease)   . Glaucoma    pt denies.   . History of blood transfusion     5 units after previous surgery  . Macular degeneration   . Stroke [facility])    after surgery in August 2019 mostly recovered still some weakness in left arm  . Wears glasses     Past Surgical History:  Procedure Laterality Date  . CHOLECYSTECTOMY  2008   lap choli  . COLONOSCOPY    . CRANIOPLASTY N/A 10/29/2018   Procedure: CRANIOPLASTY;  Surgeon: [name], MD;  Location: [facility];  Service: Neurosurgery;  Laterality: N/A;  CRANIOPLASTY  . CRANIOTOMY N/A 08/18/2018   Procedure: CRANIOTOMY TUMOR EXCISION;  Surgeon: [name], MD;  Location: [facility];  Service: Neurosurgery;  Laterality: N/A;  CRANIOTOMY TUMOR EXCISION  . NAILBED REPAIR Left 05/04/2014   Procedure: LEFT INDEX NAIL ABLATION V-Y FLAP COVERAGE;  Surgeon: [name]., MD;  Location: [facility];  Service: Orthopedics;  Laterality: Left;  index  . PILONIDAL CYST EXCISION     x2  . TONSILLECTOMY    . VASECTOMY      There were no vitals filed for this visit.  Subjective Assessment - 11/17/18 0739    Subjective  "I do have my MRI set for next Friday."    Currently in Pain?  No/denies             ADULT SLP TREATMENT - 11/15/18 1147     General Information   Behavior/Cognition  Alert;Cooperative;Pleasant mood      Treatment Provided   Treatment provided  Cognitive-Linquistic      Cognitive-Linquistic Treatment   Treatment focused on  Cognition    Skilled Treatment  Homework was incomplete; pt did complete train track plan using his computer program, printed and brought to session. Pt stated when he put his diagram in folder, he found additional home task assigned, however demonstrated good anticipatory awareness: "I realized I didn't have enough time to do it and if I started working on it I'd get things wrong because I was rushing." Pt cont'd with this task, using compensations for alternating attention; extra time was sufficient for pt to return to correct position in task independently. Verbal cues required occasionally for attention to detail, error awareness. Pt explained his track diagram to SLP and considerations when planning sizes and shapes of track sections he'd selected. Pt stated he added 3 levels of tracks in one layer, but knows his program has the capability to separate layers. Pt agreed creating a multi-layered version of his track plan would be a good task to complete at home.  Assessment / Recommendations / Plan   Plan  Continue with current plan of care      Progression Toward Goals   Progression toward goals  Progressing toward goals         SLP Short Term Goals - 10/25/18 1053      SLP SHORT TERM GOAL #1   Title  pt will provide sentence responses 18/20 with WFL speech fluidity over three sessions    Status  Achieved      SLP SHORT TERM GOAL #2   Title  pt will undergo formal cognitive linguistic assessment by visit number 3    Status  Achieved      SLP SHORT TERM GOAL #3   Title  using compensatory strategies pt will functionally participate in 10 minutes simple conversation over 3 sessions    Status  Achieved       SLP Long  Term Goals - 11/15/18 1147      SLP LONG TERM GOAL #1   Title  using compensatory strategies pt will functionally participate in 10 minutes mod complex conversation over 3 sessions    Status  Achieved      SLP LONG TERM GOAL #2   Title  pt will functionally perform complex naming tasks with use of compensations over 2 sessions    Status  Achieved      SLP LONG TERM GOAL #3   Title  pt will demo WNL emergent awareness with mod complex tasks in order to acheive 100% success over three sessions    Time  3    Period  Weeks   or 25 sessions, for all LTGs   Status  On-going      SLP LONG TERM GOAL #4   Title  pt will demo WFL alternating attention between 2-3 mod complex cognitive linguistic tasks adequate for 90% success on all tasks    Time  3    Period  Weeks    Status  On-going       Plan - 11/15/18 1147    Clinical Impression Statement  Pt's language in 15 minutes mod complex conversation today cont'd as [facility]. Pt cont'd to demo high level attentional/emergent awareness impairments today in mod complex but detailed written tasks, extra time allowed consistently, though pt with good anticipatory awareness of need for extra time. I recommend to continue skilled ST to maximize communication and higher level cognition for independence and QOL.     Speech Therapy Frequency  2x / week    Duration  4 weeks   or 25 total sessions   Potential to Achieve Goals  Good    Potential Considerations  Severity of impairments       Patient will benefit from skilled therapeutic intervention in order to improve the following deficits and impairments:   Cognitive communication deficit    Problem List Patient Active Problem List   Diagnosis Date Noted  . 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 Meningioma ([place]) 10/29/2018  . Other abnormalities of gait and mobility 09/17/2018  . Acute cerebral infarction ([place])   . Thrombocytopenia ([place])   . Hemiparesis of right dominant side as late  effect of cerebral infarction ([place])   . Acute blood loss anemia   . Generalized OA   . Gastroesophageal reflux disease   . Seizure prophylaxis   . Leukocytosis   . Tumor 08/18/2018  . Meningioma determined by biopsy of brain ([place]) 08/18/2018  . MACULAR DEGENERATION 02/29/2008  . ALLERGY 02/29/2008  .  Nonspecific (abnormal) findings on radiological and other examination of body structure 12/26/2007  . NONSPECIFIC ABNORM FIND RAD&OTH EXAM LUNG FIELD 12/26/2007  . GALLSTONES 10/01/2007  . ELEVATION, TRANSAMINASE/LDH LEVELS 09/06/2007  . HEPATITIS NOS 09/01/2007  . ABDOMINAL PAIN, EPIGASTRIC 09/01/2007    Speech Therapy Progress Note  Dates of Reporting Period: 10/13/18 to 11/15/18  Subjective Statement: Pt has been seen for 10 speech therapy visits this reporting period.  Objective Measurements: See details above  Goal Update: as above  Plan: Continue current plan of care  Deneise Lever, [place], CCC-SLP Speech-Language Pathologist  Aliene Altes 11/17/2018, 7:51 AM  [facility] [address] [place], [place], [address] Phone: [phone]   Fax:  [phone]   Name: James Olsen MRN: [id number] Date of Birth: 11/14/1946

## 2018-11-22 ENCOUNTER — Ambulatory Visit: Payer: Medicare Other | Attending: Physical Medicine & Rehabilitation | Admitting: Speech Pathology

## 2018-11-22 ENCOUNTER — Encounter: Payer: Self-pay | Admitting: Physical Therapy

## 2018-11-22 ENCOUNTER — Encounter: Payer: Self-pay | Admitting: Occupational Therapy

## 2018-11-22 ENCOUNTER — Ambulatory Visit: Payer: Medicare Other | Admitting: Occupational Therapy

## 2018-11-22 ENCOUNTER — Ambulatory Visit: Payer: Medicare Other | Admitting: Physical Therapy

## 2018-11-22 DIAGNOSIS — R41841 Cognitive communication deficit: Secondary | ICD-10-CM | POA: Diagnosis not present

## 2018-11-22 DIAGNOSIS — R2681 Unsteadiness on feet: Secondary | ICD-10-CM | POA: Insufficient documentation

## 2018-11-22 DIAGNOSIS — R4701 Aphasia: Secondary | ICD-10-CM | POA: Insufficient documentation

## 2018-11-22 DIAGNOSIS — R293 Abnormal posture: Secondary | ICD-10-CM

## 2018-11-22 DIAGNOSIS — R278 Other lack of coordination: Secondary | ICD-10-CM

## 2018-11-22 DIAGNOSIS — I69353 Hemiplegia and hemiparesis following cerebral infarction affecting right non-dominant side: Secondary | ICD-10-CM | POA: Diagnosis present

## 2018-11-22 DIAGNOSIS — M6281 Muscle weakness (generalized): Secondary | ICD-10-CM

## 2018-11-22 DIAGNOSIS — R2689 Other abnormalities of gait and mobility: Secondary | ICD-10-CM

## 2018-11-22 NOTE — Therapy (Signed)
[facility] [address] [place], [place], [address] Phone: [phone]   Fax:  [phone]  Occupational Therapy Treatment  Patient Details  Name: NYAL SCHACHTER MRN: [id number] Date of Birth: 07-03-1946 No data recorded  Encounter Date: 11/22/2018  OT End of Session - 11/22/18 1022    Visit Number  20    Number of Visits  25    Date for OT Re-Evaluation  12/06/18    Authorization Type  cert. 09/07/18-12/06/18    Authorization Time Period  Medicare & [employer], covered 100%    Authorization - Visit Number  72    Authorization - Number of Visits  30    OT Start Time  7782    OT Stop Time  1100    OT Time Calculation (min)  42 min    Activity Tolerance  Patient tolerated treatment well    Behavior During Therapy  WFL for tasks assessed/performed       Past Medical History:  Diagnosis Date  . Arthritis   . GERD (gastroesophageal reflux disease)   . Glaucoma    pt denies.   . History of blood transfusion     5 units after previous surgery  . Macular degeneration   . Stroke [facility])    after surgery in August 2019 mostly recovered still some weakness in left arm  . Wears glasses     Past Surgical History:  Procedure Laterality Date  . CHOLECYSTECTOMY  2008   lap choli  . COLONOSCOPY    . CRANIOPLASTY N/A 10/29/2018   Procedure: CRANIOPLASTY;  Surgeon: [name], MD;  Location: [facility];  Service: Neurosurgery;  Laterality: N/A;  CRANIOPLASTY  . CRANIOTOMY N/A 08/18/2018   Procedure: CRANIOTOMY TUMOR EXCISION;  Surgeon: [name], MD;  Location: [facility];  Service: Neurosurgery;  Laterality: N/A;  CRANIOTOMY TUMOR EXCISION  . NAILBED REPAIR Left 05/04/2014   Procedure: LEFT INDEX NAIL ABLATION V-Y FLAP COVERAGE;  Surgeon: [name]., MD;  Location: [facility];  Service: Orthopedics;  Laterality: Left;  index  . PILONIDAL CYST EXCISION     x2  . TONSILLECTOMY    . VASECTOMY      There  were no vitals filed for this visit.  Subjective Assessment - 11/22/18 1022    Subjective   slowly getting a little better    Pertinent History  Meningioma s/p craniotomy for tumor resection 08/18/18, Acute cerebral infarction; arthritis, macular degeneration, hepatitis, GERD    Patient Stated Goals  be back to normal or 95%    Currently in Pain?  No/denies       In sitting, closed chain shoulder flex and diagonals with BUEs with ball with min v.c. Initially for positioning.  In supine, shoulder flex and chest press closed chain with BUEs with 3lb weight x10 each.  Then open chain circumduction with RUE with 1lb wt each direction.  In prone, scapular retraction with shoulders in ext and abductionwith 1lb wt in each hand x10 each.  In standing, functional reaching to place small pegs in vertical pegboard (overhead) to copy design for incr activity tolerance and coordination.   Removing using in-hand manipulation (5 in hand) with min-mod difficulty/drops.  Arm bike x19mn level 5 for reciprocal movement/conditioning without rest.           OT Short Term Goals - 09/28/18 0753      OT SHORT TERM GOAL #1   Title  Pt will be independent  with initial HEP.--check STG 10/22/18    Time  6    Period  Weeks    Status  Achieved      OT SHORT TERM GOAL #2   Title  Pt will be able to bathe mod I.    Time  6    Period  Weeks    Status  Achieved      OT SHORT TERM GOAL #3   Title  Pt will be able to complete clothing fasteners mod I.    Time  6    Period  Weeks    Status  Achieved   09/28/18:  incr time     OT SHORT TERM GOAL #4   Title  Pt will be able to cut food mod I.    Time  6    Period  Weeks    Status  Achieved   09/28/18:  able to perform with fork/knife but no chopping/peeling     OT SHORT TERM GOAL #5   Title  Pt will demo at least 60* R shoulder flex with only min compensation for improved functional reach.    Baseline  45* with mod compensation    Period  Weeks     Status  Achieved   09/28/18:  WNL     OT SHORT TERM GOAL #6   Title  Pt will be able to use RUE as stabilizer consistently for ADLs.    Time  6    Period  Weeks    Status  Achieved        OT Long Term Goals - 11/22/18 1028      OT LONG TERM GOAL #1   Title  Pt will be independent with updated HEP.--check LTGs 12/06/18    Time  12    Period  Weeks    Status  On-going      OT LONG TERM GOAL #2   Title  Pt will perform simple cooking tasks mod I.--09/28/18 upgraded to mod complex cooking involving carrying pots/pans and chopping/peeling with RUE    Time  12    Period  Weeks    Status  Achieved   09/28/18:  met initial goal and upgraded.  11/22/18:  met, able to chop/peel and carry most pots/pans     OT LONG TERM GOAL #3   Title  Pt will perform mod complex home maintenance tasks mod I.    Time  12    Period  Weeks    Status  Achieved   10/25/18:  yardwork     OT LONG TERM GOAL #4   Title  Pt will use RUE as nondominant assist at least 75% of the time for ADLs.---upgraded 09/28/18:  will use RUE 100% of the time for previous ADL/IADL tasks.    Time  8    Period  Weeks    Status  Achieved   09/28/18:  met initial goal and upgraded.   11/22/18:  met     OT LONG TERM GOAL #5   Title  Pt will demo at least 90* R shoulder flex for functional reach with min compensation.  09/28/18:  Pt will be able to retrieve 2-3lb object from overhead shelf with good control x3.      Period  Weeks    Status  Revised   09/28/18:  initial goal met and upgraded     OT LONG TERM GOAL #6   Title  Pt will be able to carry light bag of  groceries with RUE.--09/28/18  upgrade to 5lb bag of groceries    Time  12    Period  Weeks    Status  Revised   09/28/18:  met initial goal and upgraded     OT LONG TERM GOAL #7   Title  Pt will demo improved coordination/RUE functional reach as shown by scoring at least 30 on box and blocks test.--09/28/18:  upgrade to 40 blocks    Time  12    Period  Weeks    Status   Achieved   09/28/18:  30 blocks.  10/25/18:  41 blocks     OT LONG TERM GOAL #8   Title  Pt will demo at least 35lbs R grip strength for opening containers/carrying objects.--09/28/18 upgrade to 45lbs     Baseline  22lbs    Time  12    Period  Weeks    Status  Revised   09/28/18:  34lbs     OT LONG TERM GOAL  #9   Baseline  Pt will improve coordination with R dominant UE as shown by completing 9-hole peg test in less than 60sec.  Revised:  10/25/18 to less than 45sec    Time  12    Period  Weeks    Status  Revised   Goal added 09/28/18 (baseline 102.28sec).  10/25/18:  Met & revised 58.60sec           Plan - 11/22/18 1024    Clinical Impression Statement  Pt continues to make excellent progress with activity tolerance, IADL performance, and RUE strength and control.  Pt would benefit from continued occupational therapy to incr RUE functional use for IADLs and maximize functioning/endurance prior to anticipated radiation starting.  Reporting period:  09/07/18-11/22/18.    Occupational Profile and client history currently impacting functional performance  Pt was very independent and active prior to surgery.  Pt drove, performed yardwork, most cooking/home maintenance tasks, and enjoyed outdoor leisure activities.  Pt is currently not able to drive, perform yardwork, perform previous cooking/home maintenance tasks or leisure tasks.  Pt's goal is to return to 90-100% of "normal"    Occupational performance deficits (Please refer to evaluation for details):  ADL's;IADL's;Social Participation;Leisure    Rehab Potential  Good    OT Frequency  2x / week    OT Duration  12 weeks    OT Treatment/Interventions  Self-care/ADL training;Cryotherapy;Paraffin;Therapeutic exercise;DME and/or AE instruction;Functional Mobility Training;Cognitive remediation/compensation;Balance training;Splinting;Manual Therapy;Neuromuscular education;Fluidtherapy;Ultrasound;Electrical Stimulation;Aquatic Therapy;Moist  Heat;Energy conservation;Passive range of motion;Therapeutic activities;Patient/family education    Plan  Continue with strength/activity tolerance and LUE coordination/control    OT Home Exercise Plan  Education provided:  edema management, proper positioning of RUE, initial HEP with ball, red putty HEP, coordination HEP    Consulted and Agree with Plan of Care  Patient       Patient will benefit from skilled therapeutic intervention in order to improve the following deficits and impairments:  Abnormal gait, Decreased balance, Decreased endurance, Decreased mobility, Decreased range of motion, Decreased cognition, Decreased activity tolerance, Decreased coordination, Decreased knowledge of use of DME, Decreased strength, Impaired flexibility, Impaired UE functional use  Visit Diagnosis: Hemiplegia and hemiparesis following cerebral infarction affecting right non-dominant side (HCC)  Other lack of coordination  Abnormal posture  Other abnormalities of gait and mobility  Muscle weakness (generalized)    Problem List Patient Active Problem List   Diagnosis Date Noted  . 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 Meningioma ([place]) 10/29/2018  .  Other abnormalities of gait and mobility 09/17/2018  . Acute cerebral infarction ([place])   . Thrombocytopenia ([place])   . 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 Acute blood loss anemia   . Generalized OA   . Gastroesophageal reflux disease   . Seizure prophylaxis   . Leukocytosis   . Tumor 08/18/2018  . Meningioma determined by biopsy of brain ([facility]) 08/18/2018  . MACULAR DEGENERATION 02/29/2008  . ALLERGY 02/29/2008  . Nonspecific (abnormal) findings on radiological and other examination of body structure 12/26/2007  . NONSPECIFIC ABNORM FIND RAD&OTH EXAM LUNG FIELD 12/26/2007  . GALLSTONES 10/01/2007  . ELEVATION, TRANSAMINASE/LDH LEVELS 09/06/2007  . HEPATITIS NOS 09/01/2007  . ABDOMINAL  PAIN, EPIGASTRIC 09/01/2007    [facility] 11/22/2018, 10:58 AM  [facility] [address] [place], [place], [address] Phone: [phone]   Fax:  [phone]  Name: ASHE GAGO MRN: [id number] Date of Birth: 04-14-1946   Vianne Bulls, OTR/L [facility] [address] [place], [place]  [address] [phone] phone [phone] 11/22/18 10:58 AM

## 2018-11-22 NOTE — Therapy (Signed)
[facility] [address] [place], [place], [address] Phone: [phone]   Fax:  [phone]  Physical Therapy Treatment and D/C Summary  Patient Details  Name: James Olsen MRN: [id number] Date of Birth: 1946/06/24 Referring Provider (PT): Dr. Alger Simons   Encounter Date: 11/22/2018  PT End of Session - 11/22/18 1227    Visit Number  12    Number of Visits  16    Date for PT Re-Evaluation  11/22/18    Authorization Type  Medicare Part A&B- Medicare progress note every 10th visit    Authorization Time Period  Follow medicare guidelines     PT Start Time  1150    PT Stop Time  1226    PT Time Calculation (min)  36 min    Activity Tolerance  Patient tolerated treatment well    Behavior During Therapy  [facility] for tasks assessed/performed       Past Medical History:  Diagnosis Date  . Arthritis   . GERD (gastroesophageal reflux disease)   . Glaucoma    pt denies.   . History of blood transfusion     5 units after previous surgery  . Macular degeneration   . Stroke [facility])    after surgery in August 2019 mostly recovered still some weakness in left arm  . Wears glasses     Past Surgical History:  Procedure Laterality Date  . CHOLECYSTECTOMY  2008   lap choli  . COLONOSCOPY    . CRANIOPLASTY N/A 10/29/2018   Procedure: CRANIOPLASTY;  Surgeon: [name], MD;  Location: [place];  Service: Neurosurgery;  Laterality: N/A;  CRANIOPLASTY  . CRANIOTOMY N/A 08/18/2018   Procedure: CRANIOTOMY TUMOR EXCISION;  Surgeon: [name], MD;  Location: [facility];  Service: Neurosurgery;  Laterality: N/A;  CRANIOTOMY TUMOR EXCISION  . NAILBED REPAIR Left 05/04/2014   Procedure: LEFT INDEX NAIL ABLATION V-Y FLAP COVERAGE;  Surgeon: [name]., MD;  Location: [place];  Service: Orthopedics;  Laterality: Left;  index  . PILONIDAL CYST EXCISION     x2  . TONSILLECTOMY    . VASECTOMY      There  were no vitals filed for this visit.  Subjective Assessment - 11/22/18 1154    Subjective  Reports physician was slow in releasing him back to therapy after surgery.  Reports since surgery he has remained stable and he has picked up where he left off with OT and SLP.  Feels like walking is becoming more natural but still has to pay attention to uneven surfaces and stairs.    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  No/denies         [facility] PT Assessment - 11/22/18 1159      Assessment   Medical Diagnosis  Meningioma, acute cerebral infarction    Referring Provider (PT)  Dr. Alger Simons    Onset Date/Surgical Date  08/18/18    Hand Dominance  Left    Prior Therapy  CIR      Ambulation/Gait   Ambulation/Gait  Yes    Ambulation/Gait Assistance  6: Modified independent (Device/Increase time)    Ambulation/Gait Assistance Details  outdoors over variety of surfaces, gravel, grass and roots with intermittent R  foot drag but no LOB    Ambulation Distance (Feet)  1000 Feet    Assistive device  None    Gait Pattern  Step-through pattern;Poor foot clearance - right    Ambulation Surface  Unlevel;Outdoor;Paved;Gravel;Grass    Stairs  Yes    Stairs Assistance  7: Independent    Stair Management Technique  No rails;Alternating pattern;Forwards    Number of Stairs  12    Height of Stairs  6      Berg Balance Test   Sit to Stand  Able to stand without using hands and stabilize independently    Standing Unsupported  Able to stand safely 2 minutes    Sitting with Back Unsupported but Feet Supported on Floor or Stool  Able to sit safely and securely 2 minutes    Stand to Sit  Sits safely with minimal use of hands    Transfers  Able to transfer safely, minor use of hands     Standing Unsupported with Eyes Closed  Able to stand 10 seconds safely    Standing Ubsupported with Feet Together  Able to place feet together independently and stand 1 minute safely    From Standing, Reach Forward with Outstretched Arm  Can reach confidently >25 cm (10")    From Standing Position, Pick up Object from Floor  Able to pick up shoe safely and easily    From Standing Position, Turn to Look Behind Over each Shoulder  Looks behind from both sides and weight shifts well    Turn 360 Degrees  Able to turn 360 degrees safely in 4 seconds or less    Standing Unsupported, Alternately Place Feet on Step/Stool  Able to stand independently and safely and complete 8 steps in 20 seconds    Standing Unsupported, One Foot in Front  Able to place foot tandem independently and hold 30 seconds    Standing on One Leg  Able to lift leg independently and hold > 10 seconds    Total Score  56    Berg comment:  56/56      Functional Gait  Assessment   Gait assessed   Yes    Gait Level Surface  Walks 20 ft in less than 5.5 sec, no assistive devices, good speed, no evidence for imbalance, normal gait pattern, deviates no more than 6 in outside of the 12 in walkway width.    Change in Gait Speed  Able to smoothly change walking speed without loss of balance or gait deviation. Deviate no more than 6 in outside of the 12 in walkway width.    Gait with Horizontal Head Turns  Performs head turns smoothly with no change in gait. Deviates no more than 6 in outside 12 in walkway width    Gait with Vertical Head Turns  Performs head turns with no change in gait. Deviates no more than 6 in outside 12 in walkway width.    Gait and Pivot Turn  Pivot turns safely within 3 sec and stops quickly with no loss of balance.    Step Over Obstacle  Is able to step over 2 stacked shoe boxes taped together (9 in total height) without changing gait speed. No evidence of imbalance.    Gait with Narrow Base of Support  Is able to  ambulate for 10 steps heel to toe with no staggering.    Gait with Eyes Closed  Walks 20 ft, uses assistive device, slower speed, mild gait deviations, deviates 6-10 in  outside 12 in walkway width. Ambulates 20 ft in less than 9 sec but greater than 7 sec.    Ambulating Backwards  Walks 20 ft, no assistive devices, good speed, no evidence for imbalance, normal gait    Steps  Alternating feet, no rail.    Total Score  29    FGA comment:  29/30 low falls risk                           PT Education - 11/22/18 1222    Education Details  goals met, D/C today.  Recommendations to continue HEP, use of vision and flash lights when he is having numbness in feet, use of trekking poles when walking in the woods to help with balance    Person(s) Educated  Patient    Methods  Explanation    Comprehension  Verbalized understanding       PT Short Term Goals - 10/07/18 0941      PT SHORT TERM GOAL #1   Title  Pt will demonstrate independence and compliancy with HEP to further progress towards improving strength and functional mobility     Baseline  Dependent     Time  4    Period  Weeks    Status  Achieved      PT SHORT TERM GOAL #2   Title  Pt will improve Berg Balance Assessment by 3 points indicating minimal detectable change and decreased fall risk    Baseline  10/17: 54/56     Time  4    Period  Weeks    Status  Achieved      PT SHORT TERM GOAL #3   Title  Pt will improve gait speed to 4.08 ft/sec using LRAD and supervision to improve functional mobility     Baseline  10/17: 4.33 ft/sec indicative of normal walking speed     Time  4    Period  Weeks    Status  Achieved      PT SHORT TERM GOAL #4   Title  Pt will improve FGA score by 4 points to increase dynamic standing during functional gait using no AD    Baseline  10/17: 23/30     Time  4    Period  Weeks    Status  Achieved      PT SHORT TERM GOAL #5   Title  Pt will ambulate 300 feet with LRAD ambulating  over uneven surfaces, curbs, ramps, and demonstrate ability to vary gait speed to improve community accessibility safely     Baseline  10/17: Patient achieves goal with therapist providing supervision for safety considerations    Time  4    Period  Weeks    Status  Achieved        PT Long Term Goals - 11/22/18 1158      PT LONG TERM GOAL #1   Title  Pt will demonstrate independence with HEP to further progress towards increasing strength and functional mobility     Time  8    Period  Weeks    Status  Achieved      PT LONG TERM GOAL #2   Title  Pt will improve Berg Balance Score to >55/56 to decrease fall risk potential     Baseline  56/56    Time  8    Period  Weeks    Status  Achieved      PT LONG  TERM GOAL #3   Title  Pt will improve FGA score to >27/30 to improve safety and balance during functional mobility with no AD and supervision     Baseline  29/30    Time  8    Period  Weeks    Status  Achieved      PT LONG TERM GOAL #4   Title  Pt will ambulate outdoors over uneven surfaces, curbs, ramps, inclines, and demonstrate ability to vary gait speed with LRAD and Mod I to improve safety with community accessibility     Time  8    Period  Weeks    Status  Achieved      PT LONG TERM GOAL #5   Title  Pt will negotiate 12 steps using no HR's and recirprocal stepping technique, mod I to increase safety and independence with community accessibility indicating improvement in LE strength    Baseline  independent 12 stairs, alternating sequence, no rails    Time  8    Period  Weeks    Status  Achieved      PT LONG TERM GOAL #6   Title  Pt will improve gait speed to >/= to 4.37 ft/sec indicating normal walking speed with Mod I and LRAD to increase independence with functional mobility     Baseline  4.33 ft/sec at 4 weeks- goal met    Status  Achieved            Plan - 11/22/18 1228    Clinical Impression Statement  Pt returns following cranioplasty.  Performed  reassessment of balance and gait to determine if pt will require further PT or is ready for D/C.  Pt has made excellent progress and has maintained gains after surgery.  He has met and exceeded most LTG and is ready for D/C today.  Pt to continue with HEP and use of compensations for safe ambulation due to impaired sensation in feet.    Rehab Potential  Good    Clinical Impairments Affecting Rehab Potential  R sided weakness with difficulty negotiating obstacles and performing high level balance    PT Frequency  2x / week    PT Duration  8 weeks    PT Treatment/Interventions  ADLs/Self Care Home Management;Gait training;Neuromuscular re-education;Functional mobility training;Stair training;Cognitive remediation;Energy conservation;Patient/family education;Therapeutic activities;Therapeutic exercise;Orthotic Fit/Training;Balance training;DME Instruction    PT Next Visit Plan  D/C    PT Home Exercise Plan  [id number] & [id number]    Consulted and Agree with Plan of Care  Patient       Patient will benefit from skilled therapeutic intervention in order to improve the following deficits and impairments:  Abnormal gait, Decreased cognition, Decreased knowledge of use of DME, Improper body mechanics, Decreased mobility, Postural dysfunction, Decreased strength, Impaired perceived functional ability, Impaired UE functional use, Decreased balance, Decreased safety awareness, Difficulty walking  Visit Diagnosis: Other abnormalities of gait and mobility  Muscle weakness (generalized)  Abnormal posture     Problem List Patient Active Problem List   Diagnosis Date Noted  . 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 Meningioma ([place]) 10/29/2018  . Other abnormalities of gait and mobility 09/17/2018  . Acute cerebral infarction ([place])   . Thrombocytopenia ([place])   . 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 Acute blood loss anemia   . Generalized OA   .  Gastroesophageal reflux disease   . Seizure prophylaxis   . Leukocytosis   .  Tumor 08/18/2018  . Meningioma determined by biopsy of brain ([place]) 08/18/2018  . MACULAR DEGENERATION 02/29/2008  . ALLERGY 02/29/2008  . Nonspecific (abnormal) findings on radiological and other examination of body structure 12/26/2007  . NONSPECIFIC ABNORM FIND RAD&OTH EXAM LUNG FIELD 12/26/2007  . GALLSTONES 10/01/2007  . ELEVATION, TRANSAMINASE/LDH LEVELS 09/06/2007  . HEPATITIS NOS 09/01/2007  . ABDOMINAL PAIN, EPIGASTRIC 09/01/2007   PHYSICAL THERAPY DISCHARGE SUMMARY  Visits from Start of Care: 12  Current functional level related to goals / functional outcomes: See impression statement and LTG achievement above   Remaining deficits: Mild R sided weakness and balance impairments   Education / Equipment: HEP  Plan: Patient agrees to discharge.  Patient goals were met. Patient is being discharged due to meeting the stated rehab goals.  ?????     Rico Junker, PT, DPT 11/22/18    12:30 PM    [facility] [address] [place], [place], [address] Phone: [phone]   Fax:  [phone]  Name: James Olsen MRN: [id number] Date of Birth: 02-07-1946

## 2018-11-22 NOTE — Therapy (Signed)
[facility] [address] [place], [place], [address] Phone: [phone]   Fax:  [phone]  Speech Language Pathology Treatment  Patient Details  Name: James Olsen MRN: [id number] Date of Birth: 1946/12/20 Referring Provider (SLP): Alger Simons MD   Encounter Date: 11/22/2018  End of Session - 11/22/18 1256    Visit Number  21    Number of Visits  25    Date for SLP Re-Evaluation  12/08/18    SLP Start Time  1105    SLP Stop Time   1148    SLP Time Calculation (min)  43 min    Activity Tolerance  Patient tolerated treatment well       Past Medical History:  Diagnosis Date  . Arthritis   . GERD (gastroesophageal reflux disease)   . Glaucoma    pt denies.   . History of blood transfusion     5 units after previous surgery  . Macular degeneration   . Stroke [facility])    after surgery in August 2019 mostly recovered still some weakness in left arm  . Wears glasses     Past Surgical History:  Procedure Laterality Date  . CHOLECYSTECTOMY  2008   lap choli  . COLONOSCOPY    . CRANIOPLASTY N/A 10/29/2018   Procedure: CRANIOPLASTY;  Surgeon: [name], MD;  Location: [facility];  Service: Neurosurgery;  Laterality: N/A;  CRANIOPLASTY  . CRANIOTOMY N/A 08/18/2018   Procedure: CRANIOTOMY TUMOR EXCISION;  Surgeon: [name], MD;  Location: [facility];  Service: Neurosurgery;  Laterality: N/A;  CRANIOTOMY TUMOR EXCISION  . NAILBED REPAIR Left 05/04/2014   Procedure: LEFT INDEX NAIL ABLATION V-Y FLAP COVERAGE;  Surgeon: [name]., MD;  Location: [facility];  Service: Orthopedics;  Laterality: Left;  index  . PILONIDAL CYST EXCISION     x2  . TONSILLECTOMY    . VASECTOMY      There were no vitals filed for this visit.  Subjective Assessment - 11/22/18 1107    Subjective  "I got that book and I read the first story."     Currently in Pain?  No/denies            ADULT SLP  TREATMENT - 11/22/18 1105      General Information   Behavior/Cognition  Alert;Cooperative;Pleasant mood      Treatment Provided   Treatment provided  Cognitive-Linquistic      Pain Assessment   Pain Assessment  No/denies pain      Cognitive-Linquistic Treatment   Treatment focused on  Cognition    Skilled Treatment  Pt told SLP he ordered a book SLP mentioned in a previous session; able to recall details and answer questions re: the first story of the book. Pt showed SLP his revised train track plan; he reported being able to add "layers" using software at home but was unable to change heights of tracks to different levels. He reported searching youtube videos for troubleshooting. When pt demo'd his search, SLP noted generic/non-specific search terminology. Verbal cues for problem solving to add more specific terms to his search query; subsequent searches generated more relevant results. SLP targeted alternating attention between functional organization task (planning a weekly schedule) and verbal problem solving; extended time required, however pt navigated between tasks, independently using compensations and appropriately requesting clarification re: task instructions/details. Min question cues for error awareness of simple calculation errors.      Assessment / Recommendations / Plan  Plan  Continue with current plan of care      Progression Toward Goals   Progression toward goals  Progressing toward goals         SLP Short Term Goals - 10/25/18 1053      SLP SHORT TERM GOAL #1   Title  pt will provide sentence responses 18/20 with WFL speech fluidity over three sessions    Status  Achieved      SLP SHORT TERM GOAL #2   Title  pt will undergo formal cognitive linguistic assessment by visit number 3    Status  Achieved      SLP SHORT TERM GOAL #3   Title  using compensatory strategies pt will functionally participate in 10 minutes simple conversation over 3 sessions    Status   Achieved       SLP Long Term Goals - 11/22/18 1125      SLP LONG TERM GOAL #1   Title  using compensatory strategies pt will functionally participate in 10 minutes mod complex conversation over 3 sessions    Status  Achieved      SLP LONG TERM GOAL #2   Title  pt will functionally perform complex naming tasks with use of compensations over 2 sessions    Status  Achieved      SLP LONG TERM GOAL #3   Title  pt will demo WNL emergent awareness with mod complex tasks in order to acheive 100% success over three sessions    Time  2    Period  Weeks    Status  On-going      SLP LONG TERM GOAL #4   Title  pt will demo WFL alternating attention between 2-3 mod complex cognitive linguistic tasks adequate for 90% success on all tasks    Time  2    Period  Weeks    Status  On-going       Plan - 11/22/18 1256    Clinical Impression Statement  Pt's language in 15 minutes mod complex conversation today cont'd as [facility]. Pt cont'd to demo high level attentional/emergent awareness impairments today in mod complex but detailed written tasks, extra time allowed consistently, though pt with good anticipatory awareness of need for extra time. I recommend to continue skilled ST to maximize communication and higher level cognition for independence and QOL.     Speech Therapy Frequency  2x / week    Duration  4 weeks   or total 25 sessions   Treatment/Interventions  SLP instruction and feedback;Compensatory strategies;Functional tasks;Cognitive reorganization;Internal/external aids;Patient/family education;Multimodal communcation approach;Environmental controls;Language facilitation;Cueing hierarchy    Potential to Achieve Goals  Good    Potential Considerations  Severity of impairments    Consulted and Agree with Plan of Care  Patient       Patient will benefit from skilled therapeutic intervention in order to improve the following deficits and impairments:   Cognitive communication  deficit  Aphasia    Problem List Patient Active Problem List   Diagnosis Date Noted  . 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 Meningioma ([place]) 10/29/2018  . Other abnormalities of gait and mobility 09/17/2018  . Acute cerebral infarction ([place])   . Thrombocytopenia ([place])   . 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 Acute blood loss anemia   . Generalized OA   . Gastroesophageal reflux disease   . Seizure prophylaxis   . Leukocytosis   . Tumor 08/18/2018  .  Meningioma determined by biopsy of brain ([place]) 08/18/2018  . MACULAR DEGENERATION 02/29/2008  . ALLERGY 02/29/2008  . Nonspecific (abnormal) findings on radiological and other examination of body structure 12/26/2007  . NONSPECIFIC ABNORM FIND RAD&OTH EXAM LUNG FIELD 12/26/2007  . GALLSTONES 10/01/2007  . ELEVATION, TRANSAMINASE/LDH LEVELS 09/06/2007  . HEPATITIS NOS 09/01/2007  . ABDOMINAL PAIN, EPIGASTRIC 09/01/2007   Deneise Lever, [facility], [place] Speech-Language Pathologist    Aliene Altes 11/22/2018, 12:57 PM  [facility] [address] [place], [place], [address] Phone: [phone]   Fax:  [phone]   Name: James Olsen MRN: [id number] Date of Birth: May 23, 1946

## 2018-11-22 NOTE — Addendum Note (Signed)
Addended by: Misty Stanley F on: 11/22/2018 12:35 PM   Modules accepted: Orders

## 2018-11-24 ENCOUNTER — Other Ambulatory Visit: Payer: Self-pay | Admitting: Radiation Therapy

## 2018-11-25 ENCOUNTER — Ambulatory Visit: Payer: Medicare Other | Admitting: Occupational Therapy

## 2018-11-25 ENCOUNTER — Encounter: Payer: Self-pay | Admitting: Occupational Therapy

## 2018-11-25 ENCOUNTER — Ambulatory Visit (HOSPITAL_COMMUNITY): Payer: Medicare Other

## 2018-11-25 ENCOUNTER — Ambulatory Visit: Payer: Medicare Other

## 2018-11-25 DIAGNOSIS — R41841 Cognitive communication deficit: Secondary | ICD-10-CM | POA: Diagnosis not present

## 2018-11-25 DIAGNOSIS — M6281 Muscle weakness (generalized): Secondary | ICD-10-CM

## 2018-11-25 DIAGNOSIS — R2689 Other abnormalities of gait and mobility: Secondary | ICD-10-CM

## 2018-11-25 DIAGNOSIS — R278 Other lack of coordination: Secondary | ICD-10-CM

## 2018-11-25 DIAGNOSIS — R293 Abnormal posture: Secondary | ICD-10-CM

## 2018-11-25 DIAGNOSIS — I69353 Hemiplegia and hemiparesis following cerebral infarction affecting right non-dominant side: Secondary | ICD-10-CM

## 2018-11-25 DIAGNOSIS — R2681 Unsteadiness on feet: Secondary | ICD-10-CM

## 2018-11-25 DIAGNOSIS — R4701 Aphasia: Secondary | ICD-10-CM

## 2018-11-25 NOTE — Therapy (Signed)
[facility] [address] [place], [place], [address] Phone: [phone]   Fax:  [phone]  Occupational Therapy Treatment  Patient Details  Name: James Olsen MRN: [id number] Date of Birth: [birth year] No data recorded  Encounter Date: 11/25/2018  OT End of Session - 11/25/18 1515    Visit Number  21    Number of Visits  25    Date for OT Re-Evaluation  12/06/18    Authorization Type  cert. 09/07/18-12/06/18    Authorization Time Period  Medicare & [employer], covered 100%    Authorization - Visit Number  21    Authorization - Number of Visits  30    OT Start Time  0998    OT Stop Time  1454    OT Time Calculation (min)  46 min    Activity Tolerance  Patient tolerated treatment well    Behavior During Therapy  WFL for tasks assessed/performed       Past Medical History:  Diagnosis Date  . Arthritis   . GERD (gastroesophageal reflux disease)   . Glaucoma    pt denies.   . History of blood transfusion     5 units after previous surgery  . Macular degeneration   . Stroke [facility])    after surgery in August 2019 mostly recovered still some weakness in left arm  . Wears glasses     Past Surgical History:  Procedure Laterality Date  . CHOLECYSTECTOMY  2008   lap choli  . COLONOSCOPY    . CRANIOPLASTY N/A 10/29/2018   Procedure: CRANIOPLASTY;  Surgeon: [name], MD;  Location: [facility];  Service: Neurosurgery;  Laterality: N/A;  CRANIOPLASTY  . CRANIOTOMY N/A 08/18/2018   Procedure: CRANIOTOMY TUMOR EXCISION;  Surgeon: [name], MD;  Location: [facility];  Service: Neurosurgery;  Laterality: N/A;  CRANIOTOMY TUMOR EXCISION  . NAILBED REPAIR Left 05/04/2014   Procedure: LEFT INDEX NAIL ABLATION V-Y FLAP COVERAGE;  Surgeon: [name]., MD;  Location: [facility];  Service: Orthopedics;  Laterality: Left;  index  . PILONIDAL CYST EXCISION     x2  . TONSILLECTOMY    . VASECTOMY      There  were no vitals filed for this visit.  Subjective Assessment - 11/25/18 1410    Subjective   Pt reports difficulty with coordinated wrist movements and tasks like getting items out of pockets with vision occluded or distractions    Pertinent History  Meningioma s/p craniotomy for tumor resection 08/18/18, Acute cerebral infarction; arthritis, macular degeneration, hepatitis, GERD    Patient Stated Goals  be back to normal or 95%    Currently in Pain?  No/denies         In prone, scapular retraction with shoulders in ext and abduction with 1lb wt in each hand x10 each.  In prone, Wt. Bearing through elbows with head/chest lift for scapular depression with min cues.  Then alternating UE slides with wt. Shift side to side with min cues.  In  quadruped, weight shifts forward/backward wt. Shifts, alternating UE lifts min cues for incr core/scapular stability   In tall kneeling, rolling ball forward with BUEs then RUE lifts for incr scapular strength, min cues.    Picking up blocks using gripper set on level 2 to place in container at midlevel reaching for incr strength and endurance.  Pt with min-mod difficulty/drops due to fatigue/tremors.    Discussed progress and remaining deficits that pt wants  to focus on for last 2 sessions including continued end range shoulder strength/endurance and coordination/wrist control for tasks with vision occluded/distraction.     OT Education - 11/25/18 1512    Education Details  Updated theraband HEP to red; added scapular exercise in prone to HEP--see pt instructions; Provided education on how to adjust HEP to incr difficulty/decr difficulty (depending on response to radiation) including modified quadruped, adjusting resistance/reptitions    Person(s) Educated  Patient    Methods  Explanation;Demonstration    Comprehension  Verbalized understanding;Returned demonstration;Verbal cues required       OT Short Term Goals - 09/28/18 0753      OT SHORT  TERM GOAL #1   Title  Pt will be independent with initial HEP.--check STG 10/22/18    Time  6    Period  Weeks    Status  Achieved      OT SHORT TERM GOAL #2   Title  Pt will be able to bathe mod I.    Time  6    Period  Weeks    Status  Achieved      OT SHORT TERM GOAL #3   Title  Pt will be able to complete clothing fasteners mod I.    Time  6    Period  Weeks    Status  Achieved   09/28/18:  incr time     OT SHORT TERM GOAL #4   Title  Pt will be able to cut food mod I.    Time  6    Period  Weeks    Status  Achieved   09/28/18:  able to perform with fork/knife but no chopping/peeling     OT SHORT TERM GOAL #5   Title  Pt will demo at least 60* R shoulder flex with only min compensation for improved functional reach.    Baseline  45* with mod compensation    Period  Weeks    Status  Achieved   09/28/18:  WNL     OT SHORT TERM GOAL #6   Title  Pt will be able to use RUE as stabilizer consistently for ADLs.    Time  6    Period  Weeks    Status  Achieved        OT Long Term Goals - 11/22/18 1028      OT LONG TERM GOAL #1   Title  Pt will be independent with updated HEP.--check LTGs 12/06/18    Time  12    Period  Weeks    Status  On-going      OT LONG TERM GOAL #2   Title  Pt will perform simple cooking tasks mod I.--09/28/18 upgraded to mod complex cooking involving carrying pots/pans and chopping/peeling with RUE    Time  12    Period  Weeks    Status  Achieved   09/28/18:  met initial goal and upgraded.  11/22/18:  met, able to chop/peel and carry most pots/pans     OT LONG TERM GOAL #3   Title  Pt will perform mod complex home maintenance tasks mod I.    Time  12    Period  Weeks    Status  Achieved   10/25/18:  yardwork     OT LONG TERM GOAL #4   Title  Pt will use RUE as nondominant assist at least 75% of the time for ADLs.---upgraded 09/28/18:  will use RUE 100% of the time for previous  ADL/IADL tasks.    Time  8    Period  Weeks    Status  Achieved    09/28/18:  met initial goal and upgraded.   11/22/18:  met     OT LONG TERM GOAL #5   Title  Pt will demo at least 90* R shoulder flex for functional reach with min compensation.  09/28/18:  Pt will be able to retrieve 2-3lb object from overhead shelf with good control x3.      Period  Weeks    Status  Revised   09/28/18:  initial goal met and upgraded     OT LONG TERM GOAL #6   Title  Pt will be able to carry light bag of groceries with RUE.--09/28/18  upgrade to 5lb bag of groceries    Time  12    Period  Weeks    Status  Revised   09/28/18:  met initial goal and upgraded     OT LONG TERM GOAL #7   Title  Pt will demo improved coordination/RUE functional reach as shown by scoring at least 30 on box and blocks test.--09/28/18:  upgrade to 40 blocks    Time  12    Period  Weeks    Status  Achieved   09/28/18:  30 blocks.  10/25/18:  41 blocks     OT LONG TERM GOAL #8   Title  Pt will demo at least 35lbs R grip strength for opening containers/carrying objects.--09/28/18 upgrade to 45lbs     Baseline  22lbs    Time  12    Period  Weeks    Status  Revised   09/28/18:  34lbs     OT LONG TERM GOAL  #9   Baseline  Pt will improve coordination with R dominant UE as shown by completing 9-hole peg test in less than 60sec.  Revised:  10/25/18 to less than 45sec    Time  12    Period  Weeks    Status  Revised   Goal added 09/28/18 (baseline 102.28sec).  10/25/18:  Met & revised 58.60sec           Plan - 11/25/18 1515    Clinical Impression Statement  Pt continues to progress with activity tolerance and strength.  Pt continues to Music therapist for end-range shoulder movements/reach.  Updated HEP and pt verbalized understanding.    Occupational Profile and client history currently impacting functional performance  Pt was very independent and active prior to surgery.  Pt drove, performed yardwork, most cooking/home maintenance tasks, and enjoyed outdoor leisure activities.  Pt is  currently not able to drive, perform yardwork, perform previous cooking/home maintenance tasks or leisure tasks.  Pt's goal is to return to 90-100% of "normal"    Occupational performance deficits (Please refer to evaluation for details):  ADL's;IADL's;Social Participation;Leisure    Rehab Potential  Good    OT Frequency  2x / week    OT Duration  12 weeks    OT Treatment/Interventions  Self-care/ADL training;Cryotherapy;Paraffin;Therapeutic exercise;DME and/or AE instruction;Functional Mobility Training;Cognitive remediation/compensation;Balance training;Splinting;Manual Therapy;Neuromuscular education;Fluidtherapy;Ultrasound;Electrical Stimulation;Aquatic Therapy;Moist Heat;Energy conservation;Passive range of motion;Therapeutic activities;Patient/family education    Plan  divided attention with tossing ball/rotating ball, coordination with vision occluded, forearm gym, wrist circumduction.  Check goals and d/c OT next week (discussed asking physician for return eval if needed after radiation)    OT Home Exercise Plan  Education provided:  edema management, proper positioning of RUE, initial HEP with ball, red putty  HEP, coordination HEP    Consulted and Agree with Plan of Care  Patient       Patient will benefit from skilled therapeutic intervention in order to improve the following deficits and impairments:  Abnormal gait, Decreased balance, Decreased endurance, Decreased mobility, Decreased range of motion, Decreased cognition, Decreased activity tolerance, Decreased coordination, Decreased knowledge of use of DME, Decreased strength, Impaired flexibility, Impaired UE functional use  Visit Diagnosis: Hemiplegia and hemiparesis following cerebral infarction affecting right non-dominant side (HCC)  Muscle weakness (generalized)  Other abnormalities of gait and mobility  Abnormal posture  Other lack of coordination  Unsteadiness on feet    Problem List Patient Active Problem List    Diagnosis Date Noted  . 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 Meningioma ([place]) 10/29/2018  . Other abnormalities of gait and mobility 09/17/2018  . Acute cerebral infarction ([place])   . Thrombocytopenia ([place])   . 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 Acute blood loss anemia   . Generalized OA   . Gastroesophageal reflux disease   . Seizure prophylaxis   . Leukocytosis   . Tumor 08/18/2018  . Meningioma determined by biopsy of brain ([place]) 08/18/2018  . MACULAR DEGENERATION 02/29/2008  . ALLERGY 02/29/2008  . Nonspecific (abnormal) findings on radiological and other examination of body structure 12/26/2007  . NONSPECIFIC ABNORM FIND RAD&OTH EXAM LUNG FIELD 12/26/2007  . GALLSTONES 10/01/2007  . ELEVATION, TRANSAMINASE/LDH LEVELS 09/06/2007  . HEPATITIS NOS 09/01/2007  . ABDOMINAL PAIN, EPIGASTRIC 09/01/2007    [facility] 11/25/2018, 3:20 PM  [facility] [address] [place], [place], [address] Phone: [phone]   Fax:  [phone]  Name: James Olsen MRN: [id number] Date of Birth: 1946/07/11

## 2018-11-25 NOTE — Patient Instructions (Signed)
   Scapular Retraction (Prone)   Lie with arms at sides. Pinch shoulder blades together and raise arms a few inches from floor.  (thumbs down) Repeat 10 times per set. Do 1 sessions per day.  Add 1lb weight (can/bottle of water) in each hand as able  Scapular Retraction: Abduction / Extension (Prone)   Lie with arms out from sides 90. Pinch shoulder blades together and raise arms a few inches from floor. Palms down.  Repeat 10 times per set. Do 1 sessions per day. Add 1lb weight (can/bottle of water) in each hand as able

## 2018-11-25 NOTE — Therapy (Signed)
[facility] [address] [place], [place], [address] Phone: [phone]   Fax:  [phone]  Speech Language Pathology Treatment  Patient Details  Name: James Olsen MRN: [id number] Date of Birth: [birth year] Referring Provider (SLP): Alger Simons MD   Encounter Date: 11/25/2018  End of Session - 11/25/18 1349    Visit Number  22    Number of Visits  25    Date for SLP Re-Evaluation  12/08/18    SLP Start Time  1319    SLP Stop Time   1400    SLP Time Calculation (min)  41 min    Activity Tolerance  Patient tolerated treatment well       Past Medical History:  Diagnosis Date  . Arthritis   . GERD (gastroesophageal reflux disease)   . Glaucoma    pt denies.   . History of blood transfusion     5 units after previous surgery  . Macular degeneration   . Stroke [facility])    after surgery in August 2019 mostly recovered still some weakness in left arm  . Wears glasses     Past Surgical History:  Procedure Laterality Date  . CHOLECYSTECTOMY  2008   lap choli  . COLONOSCOPY    . CRANIOPLASTY N/A 10/29/2018   Procedure: CRANIOPLASTY;  Surgeon: [name], MD;  Location: [facility];  Service: Neurosurgery;  Laterality: N/A;  CRANIOPLASTY  . CRANIOTOMY N/A 08/18/2018   Procedure: CRANIOTOMY TUMOR EXCISION;  Surgeon: [name], MD;  Location: [facility];  Service: Neurosurgery;  Laterality: N/A;  CRANIOTOMY TUMOR EXCISION  . NAILBED REPAIR Left 05/04/2014   Procedure: LEFT INDEX NAIL ABLATION V-Y FLAP COVERAGE;  Surgeon: [name]., MD;  Location: [facility];  Service: Orthopedics;  Laterality: Left;  index  . PILONIDAL CYST EXCISION     x2  . TONSILLECTOMY    . VASECTOMY      There were no vitals filed for this visit.  Subjective Assessment - 11/25/18 1338    Subjective  Pt has not been able to figure out how to get the third dimension of his track plan.     Currently in Pain?   No/denies            ADULT SLP TREATMENT - 11/25/18 1339      General Information   Behavior/Cognition  Alert;Cooperative;Pleasant mood      Treatment Provided   Treatment provided  Cognitive-Linquistic      Cognitive-Linquistic Treatment   Treatment focused on  Cognition    Skilled Treatment  SLP questioned pt re: his plan for completing his track plan. Pt told SLP a detailed practical plan including  looking at written directions and you tube directions simultaneously, as well as possibly going to a online forum and in last case calling directly if applicable. Pt told SLP he would need to double-check his responses on youtube search as many hits are not the target request. In conversation, pt demonstrated adeuqate mental flexibility in unrelated topics. Pt demonstrated anticipatory awareness with planning whether or not to hold ST or cont ST during rad tx.       Assessment / Recommendations / Plan   Plan  Continue with current plan of care      Progression Toward Goals   Progression toward goals  Progressing toward goals         SLP Short Term Goals - 10/25/18 1053      SLP  SHORT TERM GOAL #1   Title  pt will provide sentence responses 18/20 with WFL speech fluidity over three sessions    Status  Achieved      SLP SHORT TERM GOAL #2   Title  pt will undergo formal cognitive linguistic assessment by visit number 3    Status  Achieved      SLP SHORT TERM GOAL #3   Title  using compensatory strategies pt will functionally participate in 10 minutes simple conversation over 3 sessions    Status  Achieved       SLP Long Term Goals - 11/25/18 1418      SLP LONG TERM GOAL #1   Title  using compensatory strategies pt will functionally participate in 10 minutes mod complex conversation over 3 sessions    Status  Achieved      SLP LONG TERM GOAL #2   Title  pt will functionally perform complex naming tasks with use of compensations over 2 sessions    Status  Achieved       SLP LONG TERM GOAL #3   Title  pt will demo WNL emergent awareness with mod complex tasks in order to acheive 100% success over three sessions    Time  2    Period  Weeks    Status  On-going      SLP LONG TERM GOAL #4   Title  pt will demo WFL alternating attention between 2-3 mod complex cognitive linguistic tasks adequate for 90% success on all tasks    Status  Achieved       Plan - 11/25/18 1416    Clinical Impression Statement  Pt's language in 20 minutes mod complex conversation today cont'd as [facility]. Pt demonstrates improving high level attentional/ awareness impairments today in functional tasks - see skilled intervention for details. Pt and SLP agree best to d/c in 2-3 more sessions and re-eval PRN in the future.     Speech Therapy Frequency  2x / week    Duration  4 weeks   or total 25 sessions   Treatment/Interventions  SLP instruction and feedback;Compensatory strategies;Functional tasks;Cognitive reorganization;Internal/external aids;Patient/family education;Multimodal communcation approach;Environmental controls;Language facilitation;Cueing hierarchy    Potential to Achieve Goals  Good    Potential Considerations  Severity of impairments    Consulted and Agree with Plan of Care  Patient       Patient will benefit from skilled therapeutic intervention in order to improve the following deficits and impairments:   Cognitive communication deficit  Aphasia    Problem List Patient Active Problem List   Diagnosis Date Noted  . 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 Meningioma ([place]) 10/29/2018  . Other abnormalities of gait and mobility 09/17/2018  . Acute cerebral infarction ([place])   . Thrombocytopenia ([place])   . 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 Acute blood loss anemia   . Generalized OA   . Gastroesophageal reflux disease   . Seizure prophylaxis   . Leukocytosis   . Tumor 08/18/2018  . Meningioma  determined by biopsy of brain ([place]) 08/18/2018  . MACULAR DEGENERATION 02/29/2008  . ALLERGY 02/29/2008  . Nonspecific (abnormal) findings on radiological and other examination of body structure 12/26/2007  . NONSPECIFIC ABNORM FIND RAD&OTH EXAM LUNG FIELD 12/26/2007  . GALLSTONES 10/01/2007  . ELEVATION, TRANSAMINASE/LDH LEVELS 09/06/2007  . HEPATITIS NOS 09/01/2007  . ABDOMINAL PAIN, EPIGASTRIC 09/01/2007    Angelly Spearing ,MS, CCC-SLP  11/25/2018,  2:25 PM  [facility] [address] [place], [place], [address] Phone: [phone]   Fax:  [phone]   Name: James Olsen MRN: [id number] Date of Birth: 11-Feb-1946

## 2018-11-26 ENCOUNTER — Encounter (HOSPITAL_COMMUNITY): Payer: Self-pay | Admitting: Radiology

## 2018-11-26 ENCOUNTER — Ambulatory Visit (HOSPITAL_COMMUNITY)
Admission: RE | Admit: 2018-11-26 | Discharge: 2018-11-26 | Disposition: A | Discharge status: Home / Self Care | Payer: Medicare Other | Source: Ambulatory Visit | Attending: Radiation Oncology | Admitting: Radiation Oncology

## 2018-11-26 DIAGNOSIS — Z9889 Other specified postprocedural states: Secondary | ICD-10-CM | POA: Diagnosis not present

## 2018-11-26 DIAGNOSIS — D329 Benign neoplasm of meninges, unspecified: Secondary | ICD-10-CM | POA: Insufficient documentation

## 2018-11-26 DIAGNOSIS — G9389 Other specified disorders of brain: Secondary | ICD-10-CM | POA: Insufficient documentation

## 2018-11-26 LAB — CREATININE, SERUM
Creatinine, Ser: 0.95 mg/dL (ref 0.61–1.24)
GFR calc Af Amer: >60 mL/min (ref >60)
GFR calc non Af Amer: >60 mL/min (ref >60)

## 2018-11-26 MED ORDER — GADOBUTROL 1 MMOL/ML IV SOLN
10.0000 mL | Freq: Once | INTRAVENOUS | Status: AC | PRN
  Administered 2018-11-26: 10 mL via INTRAVENOUS

## 2018-11-29 ENCOUNTER — Ambulatory Visit: Payer: Medicare Other

## 2018-11-29 ENCOUNTER — Encounter: Payer: Self-pay | Admitting: Occupational Therapy

## 2018-11-29 ENCOUNTER — Other Ambulatory Visit: Payer: Medicare Other

## 2018-11-29 ENCOUNTER — Ambulatory Visit: Payer: Medicare Other | Admitting: Occupational Therapy

## 2018-11-29 DIAGNOSIS — I69353 Hemiplegia and hemiparesis following cerebral infarction affecting right non-dominant side: Secondary | ICD-10-CM

## 2018-11-29 DIAGNOSIS — R2689 Other abnormalities of gait and mobility: Secondary | ICD-10-CM

## 2018-11-29 DIAGNOSIS — R4701 Aphasia: Secondary | ICD-10-CM

## 2018-11-29 DIAGNOSIS — R41841 Cognitive communication deficit: Secondary | ICD-10-CM | POA: Diagnosis not present

## 2018-11-29 DIAGNOSIS — R278 Other lack of coordination: Secondary | ICD-10-CM

## 2018-11-29 DIAGNOSIS — R2681 Unsteadiness on feet: Secondary | ICD-10-CM

## 2018-11-29 DIAGNOSIS — R293 Abnormal posture: Secondary | ICD-10-CM

## 2018-11-29 DIAGNOSIS — M6281 Muscle weakness (generalized): Secondary | ICD-10-CM

## 2018-11-29 NOTE — Patient Instructions (Signed)
Flexor Tendon Gliding (Active Hook Fist)   With fingers and knuckles straight, bend middle and tip joints. Do not bend large knuckles. Repeat 10 times. Do 2 sessions per day.     Flexor Tendon Gliding (Active Straight Fist)   Start with fingers straight. Bend knuckles and middle joints. Keep fingertip joints straight to touch base of palm. Repeat 10 times. Do 2 sessions per day.   MP Flexion (Active Isolated)   Bend ALL fingers at large knuckle, keeping other fingers straight. Do not bend tips. Repeat 10 times. Do 2 sessions per day.      1.  Tossing/catching ball in right hand.  2.  Performing coordination tasks while walking, with distractions and while not looking at hand.  3.  Flip card through each finger.

## 2018-11-29 NOTE — Therapy (Signed)
[facility] [address] [place], [place], [address] Phone: [phone]   Fax:  [phone]  Occupational Therapy Treatment  Patient Details  Name: James Olsen MRN: [id number] Date of Birth: 12/29/1945 No data recorded  Encounter Date: 11/29/2018  OT End of Session - 11/29/18 1023    Visit Number  22    Number of Visits  25    Date for OT Re-Evaluation  12/06/18    Authorization Type  cert. 09/07/18-12/06/18    Authorization Time Period  Medicare & [employer], covered 100%    Authorization - Visit Number  42    Authorization - Number of Visits  30    OT Start Time  1021    OT Stop Time  1100    OT Time Calculation (min)  39 min    Activity Tolerance  Patient tolerated treatment well    Behavior During Therapy  WFL for tasks assessed/performed       Past Medical History:  Diagnosis Date  . Arthritis   . GERD (gastroesophageal reflux disease)   . Glaucoma    pt denies.   . History of blood transfusion     5 units after previous surgery  . Macular degeneration   . Stroke [facility]    after surgery in August 2019 mostly recovered still some weakness in left arm  . Wears glasses     Past Surgical History:  Procedure Laterality Date  . CHOLECYSTECTOMY  2008   lap choli  . COLONOSCOPY    . CRANIOPLASTY N/A 10/29/2018   Procedure: CRANIOPLASTY;  Surgeon: [name], MD;  Location: [facility];  Service: Neurosurgery;  Laterality: N/A;  CRANIOPLASTY  . CRANIOTOMY N/A 08/18/2018   Procedure: CRANIOTOMY TUMOR EXCISION;  Surgeon: [name], MD;  Location: [facility];  Service: Neurosurgery;  Laterality: N/A;  CRANIOTOMY TUMOR EXCISION  . NAILBED REPAIR Left 05/04/2014   Procedure: LEFT INDEX NAIL ABLATION V-Y FLAP COVERAGE;  Surgeon: [name]., MD;  Location: [facility];  Service: Orthopedics;  Laterality: Left;  index  . PILONIDAL CYST EXCISION     x2  . TONSILLECTOMY    . VASECTOMY      There  were no vitals filed for this visit.  Subjective Assessment - 11/29/18 1022    Subjective   Pt reports difficulty carrying bag with handles and making tight fist.    Pertinent History  Meningioma s/p craniotomy for tumor resection 08/18/18, Acute cerebral infarction; arthritis, macular degeneration, hepatitis, GERD    Patient Stated Goals  be back to normal or 95%    Currently in Pain?  No/denies      Forearm gym for incr wrist movement and coordination with min v.c. (also recommended "ball" handheld games for home).  Ambulating and performing category generation while rotating relaxation balls with min difficulty for incr divided attention and coordination.  Rotating ball in fingertips with min difficulty with vision occluded.  Manipulating keys in hand with vision occluded to position specific key ready to use x4 with min difficulty.  Tossing/catching ball in R hand with min-mod difficulty/drops and cues for incr isolated wrist movement.         OT Education - 11/29/18 1552    Education Details  Updates to HEP--see pt instructions; ways to incr difficulty of coordination ex and performing isolatedIP and MP flex, straight fist exercises with putty to incr difficulty prn if future     Person(s) Educated  Patient  Methods  Explanation;Demonstration;Verbal cues;Handout    Comprehension  Verbalized understanding;Returned demonstration;Verbal cues required       OT Short Term Goals - 09/28/18 0753      OT SHORT TERM GOAL #1   Title  Pt will be independent with initial HEP.--check STG 10/22/18    Time  6    Period  Weeks    Status  Achieved      OT SHORT TERM GOAL #2   Title  Pt will be able to bathe mod I.    Time  6    Period  Weeks    Status  Achieved      OT SHORT TERM GOAL #3   Title  Pt will be able to complete clothing fasteners mod I.    Time  6    Period  Weeks    Status  Achieved   09/28/18:  incr time     OT SHORT TERM GOAL #4   Title  Pt will be able to  cut food mod I.    Time  6    Period  Weeks    Status  Achieved   09/28/18:  able to perform with fork/knife but no chopping/peeling     OT SHORT TERM GOAL #5   Title  Pt will demo at least 60* R shoulder flex with only min compensation for improved functional reach.    Baseline  45* with mod compensation    Period  Weeks    Status  Achieved   09/28/18:  WNL     OT SHORT TERM GOAL #6   Title  Pt will be able to use RUE as stabilizer consistently for ADLs.    Time  6    Period  Weeks    Status  Achieved        OT Long Term Goals - 11/29/18 1551      OT LONG TERM GOAL #1   Title  Pt will be independent with updated HEP.--check LTGs 12/06/18    Time  12    Period  Weeks    Status  Achieved   11/29/18     OT LONG TERM GOAL #2   Title  Pt will perform simple cooking tasks mod I.--09/28/18 upgraded to mod complex cooking involving carrying pots/pans and chopping/peeling with RUE    Time  12    Period  Weeks    Status  Achieved   09/28/18:  met initial goal and upgraded.  11/22/18:  met, able to chop/peel and carry most pots/pans     OT LONG TERM GOAL #3   Title  Pt will perform mod complex home maintenance tasks mod I.    Time  12    Period  Weeks    Status  Achieved   10/25/18:  yardwork     OT LONG TERM GOAL #4   Title  Pt will use RUE as nondominant assist at least 75% of the time for ADLs.---upgraded 09/28/18:  will use RUE 100% of the time for previous ADL/IADL tasks.    Time  8    Period  Weeks    Status  Achieved   09/28/18:  met initial goal and upgraded.   11/22/18:  met     OT LONG TERM GOAL #5   Title  Pt will demo at least 90* R shoulder flex for functional reach with min compensation.  09/28/18:  Pt will be able to retrieve 2-3lb object from overhead shelf with good  control x3.      Period  Weeks    Status  Revised   09/28/18:  initial goal met and upgraded     OT LONG TERM GOAL #6   Title  Pt will be able to carry light bag of groceries with RUE.--09/28/18   upgrade to 5lb bag of groceries    Time  12    Period  Weeks    Status  Revised   09/28/18:  met initial goal and upgraded     OT LONG TERM GOAL #7   Title  Pt will demo improved coordination/RUE functional reach as shown by scoring at least 30 on box and blocks test.--09/28/18:  upgrade to 40 blocks    Time  12    Period  Weeks    Status  Achieved   09/28/18:  30 blocks.  10/25/18:  41 blocks     OT LONG TERM GOAL #8   Title  Pt will demo at least 35lbs R grip strength for opening containers/carrying objects.--09/28/18 upgrade to 45lbs     Baseline  22lbs    Time  12    Period  Weeks    Status  Revised   09/28/18:  34lbs     OT LONG TERM GOAL  #9   Baseline  Pt will improve coordination with R dominant UE as shown by completing 9-hole peg test in less than 60sec.  Revised:  10/25/18 to less than 45sec    Time  12    Period  Weeks    Status  Revised   Goal added 09/28/18 (baseline 102.28sec).  10/25/18:  Met & revised 58.60sec           Plan - 11/29/18 1550    Clinical Impression Statement  Pt continues to make good progress with coordination and demo improved flexibility of fingers after updated HEP today.      Occupational Profile and client history currently impacting functional performance  Pt was very independent and active prior to surgery.  Pt drove, performed yardwork, most cooking/home maintenance tasks, and enjoyed outdoor leisure activities.  Pt is currently not able to drive, perform yardwork, perform previous cooking/home maintenance tasks or leisure tasks.  Pt's goal is to return to 90-100% of "normal"    Occupational performance deficits (Please refer to evaluation for details):  ADL's;IADL's;Social Participation;Leisure    Rehab Potential  Good    OT Frequency  2x / week    OT Duration  12 weeks    OT Treatment/Interventions  Self-care/ADL training;Cryotherapy;Paraffin;Therapeutic exercise;DME and/or AE instruction;Functional Mobility Training;Cognitive  remediation/compensation;Balance training;Splinting;Manual Therapy;Neuromuscular education;Fluidtherapy;Ultrasound;Electrical Stimulation;Aquatic Therapy;Moist Heat;Energy conservation;Passive range of motion;Therapeutic activities;Patient/family education    Plan  Check goals and d/c OT next session.    OT Home Exercise Plan  Education provided:  edema management, proper positioning of RUE, initial HEP with ball, red putty HEP, coordination HEP    Consulted and Agree with Plan of Care  Patient       Patient will benefit from skilled therapeutic intervention in order to improve the following deficits and impairments:  Abnormal gait, Decreased balance, Decreased endurance, Decreased mobility, Decreased range of motion, Decreased cognition, Decreased activity tolerance, Decreased coordination, Decreased knowledge of use of DME, Decreased strength, Impaired flexibility, Impaired UE functional use  Visit Diagnosis: Muscle weakness (generalized)  Other abnormalities of gait and mobility  Hemiplegia and hemiparesis following cerebral infarction affecting right non-dominant side (HCC)  Abnormal posture  Other lack of coordination  Unsteadiness on feet  Problem List Patient Active Problem List   Diagnosis Date Noted  . 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 Meningioma ([place]) 10/29/2018  . Other abnormalities of gait and mobility 09/17/2018  . Acute cerebral infarction ([place])   . Thrombocytopenia ([place])   . 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 Acute blood loss anemia   . Generalized OA   . Gastroesophageal reflux disease   . Seizure prophylaxis   . Leukocytosis   . Tumor 08/18/2018  . Meningioma determined by biopsy of brain ([place]) 08/18/2018  . MACULAR DEGENERATION 02/29/2008  . ALLERGY 02/29/2008  . Nonspecific (abnormal) findings on radiological and other examination of body structure 12/26/2007  . NONSPECIFIC ABNORM FIND  RAD&OTH EXAM LUNG FIELD 12/26/2007  . GALLSTONES 10/01/2007  . ELEVATION, TRANSAMINASE/LDH LEVELS 09/06/2007  . HEPATITIS NOS 09/01/2007  . ABDOMINAL PAIN, EPIGASTRIC 09/01/2007    [facility] 11/29/2018, 3:54 PM  [facility] [address] [place], [place], [address] Phone: [phone]   Fax:  [phone]  Name: James Olsen MRN: [id number] Date of Birth: 1946/08/28   Vianne Bulls, OTR/L [facility] [address] [place], [place]  [address] [phone] phone [phone] 11/29/18 3:54 PM

## 2018-11-29 NOTE — Therapy (Signed)
[facility] [address] [place], [place], [address] Phone: [phone]   Fax:  [phone]  Speech Language Pathology Treatment  Patient Details  Name: James Olsen MRN: [id number] Date of Birth: 12-13-1946 Referring Provider (SLP): Alger Simons MD   Encounter Date: 11/29/2018  End of Session - 11/29/18 1659    Visit Number  23    Number of Visits  25    Date for SLP Re-Evaluation  12/08/18    SLP Start Time  1103    SLP Stop Time   1145    SLP Time Calculation (min)  42 min    Activity Tolerance  Patient tolerated treatment well       Past Medical History:  Diagnosis Date  . Arthritis   . GERD (gastroesophageal reflux disease)   . Glaucoma    pt denies.   . History of blood transfusion     5 units after previous surgery  . Macular degeneration   . Stroke [facility])    after surgery in August 2019 mostly recovered still some weakness in left arm  . Wears glasses     Past Surgical History:  Procedure Laterality Date  . CHOLECYSTECTOMY  2008   lap choli  . COLONOSCOPY    . CRANIOPLASTY N/A 10/29/2018   Procedure: CRANIOPLASTY;  Surgeon: [name], MD;  Location: [facility];  Service: Neurosurgery;  Laterality: N/A;  CRANIOPLASTY  . CRANIOTOMY N/A 08/18/2018   Procedure: CRANIOTOMY TUMOR EXCISION;  Surgeon: [name], MD;  Location: [facility];  Service: Neurosurgery;  Laterality: N/A;  CRANIOTOMY TUMOR EXCISION  . NAILBED REPAIR Left 05/04/2014   Procedure: LEFT INDEX NAIL ABLATION V-Y FLAP COVERAGE;  Surgeon: [name]., MD;  Location: [facility];  Service: Orthopedics;  Laterality: Left;  index  . PILONIDAL CYST EXCISION     x2  . TONSILLECTOMY    . VASECTOMY      There were no vitals filed for this visit.  Subjective Assessment - 11/29/18 1117    Subjective  Pt arrived with a third dimension to his railroad track plan.    Currently in Pain?  No/denies             ADULT SLP TREATMENT - 11/29/18 1118      General Information   Behavior/Cognition  Alert;Cooperative;Pleasant mood      Treatment Provided   Treatment provided  Cognitive-Linquistic      Cognitive-Linquistic Treatment   Treatment focused on  Cognition    Skilled Treatment  Pt has not figured out how to make plains on his track plan but can now do rise of altitude. SLP initiated the Cogntive Linguistic Quick Test (CLQT) to get baseline pre-rad tx.       Assessment / Recommendations / Plan   Plan  Continue with current plan of care      Progression Toward Goals   Progression toward goals  Progressing toward goals         SLP Short Term Goals - 10/25/18 1053      SLP SHORT TERM GOAL #1   Title  pt will provide sentence responses 18/20 with WFL speech fluidity over three sessions    Status  Achieved      SLP SHORT TERM GOAL #2   Title  pt will undergo formal cognitive linguistic assessment by visit number 3    Status  Achieved      SLP SHORT TERM GOAL #3  Title  using compensatory strategies pt will functionally participate in 10 minutes simple conversation over 3 sessions    Status  Achieved       SLP Long Term Goals - 11/29/18 1701      SLP LONG TERM GOAL #1   Title  using compensatory strategies pt will functionally participate in 10 minutes mod complex conversation over 3 sessions    Status  Achieved      SLP LONG TERM GOAL #2   Title  pt will functionally perform complex naming tasks with use of compensations over 2 sessions    Status  Achieved      SLP LONG TERM GOAL #3   Title  pt will demo WNL emergent awareness with mod complex tasks in order to acheive 100% success over three sessions    Time  1    Period  Weeks    Status  On-going      SLP LONG TERM GOAL #4   Title  pt will demo WFL alternating attention between 2-3 mod complex cognitive linguistic tasks adequate for 90% success on all tasks    Status  Achieved       Plan - 11/29/18  1700    Clinical Impression Statement  Pt's language in 20 minutes mod complex conversation today cont'd again as [facility]. Pt was initiated CLQT today, to complete next session and then to be d/c'd until post-rad tx, if necessary. See skilled intervention for details. Pt and SLP agree best to d/c in 2-3 more sessions and re-eval PRN in the future.     Speech Therapy Frequency  2x / week    Duration  4 weeks   or total 25 sessions   Treatment/Interventions  SLP instruction and feedback;Compensatory strategies;Functional tasks;Cognitive reorganization;Internal/external aids;Patient/family education;Multimodal communcation approach;Environmental controls;Language facilitation;Cueing hierarchy    Potential to Achieve Goals  Good    Potential Considerations  Severity of impairments    Consulted and Agree with Plan of Care  Patient       Patient will benefit from skilled therapeutic intervention in order to improve the following deficits and impairments:   Cognitive communication deficit  Aphasia    Problem List Patient Active Problem List   Diagnosis Date Noted  . 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 Meningioma ([place]) 10/29/2018  . Other abnormalities of gait and mobility 09/17/2018  . Acute cerebral infarction ([place])   . Thrombocytopenia ([place])   . 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 Acute blood loss anemia   . Generalized OA   . Gastroesophageal reflux disease   . Seizure prophylaxis   . Leukocytosis   . Tumor 08/18/2018  . Meningioma determined by biopsy of brain ([place]) 08/18/2018  . MACULAR DEGENERATION 02/29/2008  . ALLERGY 02/29/2008  . Nonspecific (abnormal) findings on radiological and other examination of body structure 12/26/2007  . NONSPECIFIC ABNORM FIND RAD&OTH EXAM LUNG FIELD 12/26/2007  . GALLSTONES 10/01/2007  . ELEVATION, TRANSAMINASE/LDH LEVELS 09/06/2007  . HEPATITIS NOS 09/01/2007  . ABDOMINAL PAIN, EPIGASTRIC  09/01/2007    Lawrence Roldan ,[place], CCC-SLP  11/29/2018, 5:06 PM  [facility] [address] [place], [place], [address] Phone: [phone]   Fax:  [phone]   Name: James Olsen MRN: [id number] Date of Birth: February 02, 1946

## 2018-11-30 ENCOUNTER — Ambulatory Visit
Admission: RE | Admit: 2018-11-30 | Discharge: 2018-11-30 | Disposition: A | Discharge status: Home / Self Care | Payer: Medicare Other | Source: Ambulatory Visit | Attending: Radiation Oncology | Admitting: Radiation Oncology

## 2018-11-30 DIAGNOSIS — D420 Neoplasm of uncertain behavior of cerebral meninges: Secondary | ICD-10-CM | POA: Diagnosis not present

## 2018-11-30 DIAGNOSIS — Z510 Encounter for antineoplastic radiation therapy: Secondary | ICD-10-CM | POA: Insufficient documentation

## 2018-11-30 NOTE — Progress Notes (Addendum)
James Olsen [id number] 03-08-1946  SIMULATION AND TREATMENT PLANNING NOTE   OUTPATIENT  DIAGNOSIS:    ICD-10-CM   1. 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  Identity was confirmed.  All relevant records and images related to the planned course of therapy were reviewed.  The patient freely provided informed written consent to proceed with treatment after reviewing the details related to the planned course of therapy. The consent form was witnessed and verified by the simulation staff.    Then, the patient was set-up in a stable reproducible  supine position for radiation therapy. Aquaplast head mask was made for immobilization.  CT images were obtained.  Surface markings were placed.  The CT images were loaded into the planning software.    TREATMENT PLANNING NOTE: Treatment planning then occurred.  The radiation prescription was entered and confirmed.    A total of 1 medically necessary complex treatment devices were fabricated and supervised by me, in the form of Aquaplast mask.   MULTIPLE FIELDS WITH MLCs MAY DESIGNED IN DOSIMETRY for dose homogeneity.  I have requested : Intensity Modulated Radiotherapy (IMRT) is medically necessary for this case for the following reason:  Critical CNS structure avoidance - brainstem, optic chiasm, optic nerves,  brain.  The patient will receive 55.8 Gy in 31 fractions to the meningioma with appropriate margin.    -----------------------------------  Eppie Gibson, MD

## 2018-11-30 NOTE — Addendum Note (Signed)
Encounter addended by: Eppie Gibson, MD on: 11/30/2018 11:21 AM  Actions taken: Sign clinical note

## 2018-12-02 ENCOUNTER — Ambulatory Visit: Payer: Medicare Other

## 2018-12-02 ENCOUNTER — Encounter: Payer: Self-pay | Admitting: Occupational Therapy

## 2018-12-02 ENCOUNTER — Ambulatory Visit: Payer: Medicare Other | Admitting: Occupational Therapy

## 2018-12-02 DIAGNOSIS — R4701 Aphasia: Secondary | ICD-10-CM

## 2018-12-02 DIAGNOSIS — R293 Abnormal posture: Secondary | ICD-10-CM

## 2018-12-02 DIAGNOSIS — M6281 Muscle weakness (generalized): Secondary | ICD-10-CM

## 2018-12-02 DIAGNOSIS — R278 Other lack of coordination: Secondary | ICD-10-CM

## 2018-12-02 DIAGNOSIS — R2689 Other abnormalities of gait and mobility: Secondary | ICD-10-CM

## 2018-12-02 DIAGNOSIS — R41841 Cognitive communication deficit: Secondary | ICD-10-CM | POA: Diagnosis not present

## 2018-12-02 DIAGNOSIS — R2681 Unsteadiness on feet: Secondary | ICD-10-CM

## 2018-12-02 DIAGNOSIS — I69353 Hemiplegia and hemiparesis following cerebral infarction affecting right non-dominant side: Secondary | ICD-10-CM

## 2018-12-02 NOTE — Therapy (Signed)
[facility] [address] [place], [place], [address] Phone: [phone]   Fax:  [phone]  Speech Language Pathology Treatment, & Discharge Summary  Patient Details  Name: James Olsen MRN: [id number] Date of Birth: May 07, 1946 Referring Provider (SLP): Alger Simons MD   Encounter Date: 12/02/2018  End of Session - 12/02/18 1337    Visit Number  24    Number of Visits  25    Date for SLP Re-Evaluation  12/08/18    SLP Start Time  0933    SLP Stop Time   6759    SLP Time Calculation (min)  42 min    Activity Tolerance  Patient tolerated treatment well       Past Medical History:  Diagnosis Date  . Arthritis   . GERD (gastroesophageal reflux disease)   . Glaucoma    pt denies.   . History of blood transfusion     5 units after previous surgery  . Macular degeneration   . Stroke [facility])    after surgery in August 2019 mostly recovered still some weakness in left arm  . Wears glasses     Past Surgical History:  Procedure Laterality Date  . CHOLECYSTECTOMY  2008   lap choli  . COLONOSCOPY    . CRANIOPLASTY N/A 10/29/2018   Procedure: CRANIOPLASTY;  Surgeon: [name], MD;  Location: [facility];  Service: Neurosurgery;  Laterality: N/A;  CRANIOPLASTY  . CRANIOTOMY N/A 08/18/2018   Procedure: CRANIOTOMY TUMOR EXCISION;  Surgeon: [name], MD;  Location: [facility];  Service: Neurosurgery;  Laterality: N/A;  CRANIOTOMY TUMOR EXCISION  . NAILBED REPAIR Left 05/04/2014   Procedure: LEFT INDEX NAIL ABLATION V-Y FLAP COVERAGE;  Surgeon: [name]., MD;  Location: [facility];  Service: Orthopedics;  Laterality: Left;  index  . PILONIDAL CYST EXCISION     x2  . TONSILLECTOMY    . VASECTOMY      There were no vitals filed for this visit.  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    Currently in Pain?   No/denies            ADULT SLP TREATMENT - 12/02/18 0946      General Information   Behavior/Cognition  Alert;Cooperative;Pleasant mood      Treatment Provided   Treatment provided  Cognitive-Linquistic      Cognitive-Linquistic Treatment   Treatment focused on  Cognition    Skilled Treatment  "One tool is to 'calm down and take a few breaths'," pt stated, re: his attention to detail. Pt stated this strategy has been more helpful for him in the last 1-2 weeks. Pt spoke with pt in 20 minutes conversation and pt with two errors in word finding/dysnomia that he fixed spontaneously. In description task pt responses with adequate/WNL detail. Pt expresses satisfaction for current level, and thanks to SLP for the work that has been completed thus far with his cognition. SLP reiterated that should pt require ST following radiation Dr. Isidore Moos (rad onc) or PCP could order.       Assessment / Recommendations / Plan   Plan  Continue with current plan of care;Discharge SLP treatment due to (comment)   met goals - will d/c but pt could return post rad tx     Progression Toward Goals   Progression toward goals  Goals met, education completed, patient discharged  from [facility] Term Goals - 10/25/18 1053      SLP SHORT TERM GOAL #1   Title  pt will provide sentence responses 18/20 with WFL speech fluidity over three sessions    Status  Achieved      SLP SHORT TERM GOAL #2   Title  pt will undergo formal cognitive linguistic assessment by visit number 3    Status  Achieved      SLP SHORT TERM GOAL #3   Title  using compensatory strategies pt will functionally participate in 10 minutes simple conversation over 3 sessions    Status  Achieved       SLP Long Term Goals - 12/02/18 1340      SLP LONG TERM GOAL #1   Title  using compensatory strategies pt will functionally participate in 10 minutes mod complex conversation over 3 sessions    Status  Achieved      SLP LONG  TERM GOAL #2   Title  pt will functionally perform complex naming tasks with use of compensations over 2 sessions    Status  Achieved      SLP LONG TERM GOAL #3   Title  pt will demo WNL emergent awareness with mod complex tasks in order to acheive 100% success over three sessions    Status  Partially Met      SLP LONG TERM GOAL #4   Title  pt will demo [facility] [place] alternating attention between 2-3 mod complex cognitive linguistic tasks adequate for 90% success on all tasks    Status  Achieved       Plan - 12/02/18 1337    Clinical Impression Statement  Pt's language in 20 minutes mod complex conversation today cont'd again as [facility] - pt with two errors with dysnomia which he spontaneously corrected. See skilled intervention for details. Pt and SLP agree best to d/c in 2-3 more sessions and re-eval PRN in the future.     Treatment/Interventions  SLP instruction and feedback;Compensatory strategies;Functional tasks;Cognitive reorganization;Internal/external aids;Patient/family education;Multimodal communcation approach;Environmental controls;Language facilitation;Cueing hierarchy    Potential to Achieve Goals  Good    Potential Considerations  Severity of impairments    Consulted and Agree with Plan of Care  Patient       Patient will benefit from skilled therapeutic intervention in order to improve the following deficits and impairments:   Cognitive communication deficit  Aphasia   SPEECH THERAPY DISCHARGE SUMMARY  Visits from Start of Care: 24  Current functional level related to goals / functional outcomes: Pt met all but one LTG - and partially met that final goal (2/3 sessions, however SLP thinke pt would have met final parameter if given another session). Pt begins radiation next week. See goals above for update. Generally, pt did very well in this course of ST, functionally compensating for any anomia and recently has been excellent with awareness of language errors. Attention to detail  has improved in cognitive linguistic tasks.     Remaining deficits: Mild anomia, mild high level cognitive communication deficits - suspected VERY NEAR NORMAL levels.   Education / Equipment: Compensations for anomia and for attention.  Plan: Patient agrees to discharge.  Patient goals were partially met. Patient is being discharged due to a change in medical status.  ?????(beginning radiation).       Problem List Patient Active Problem List   Diagnosis Date Noted  . 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  Meningioma ([place]) 10/29/2018  . Other abnormalities of gait and mobility 09/17/2018  . Acute cerebral infarction ([place])   . Thrombocytopenia ([place])   . 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)   . Acute blood loss anemia   . Generalized OA   . Gastroesophageal reflux disease   . Seizure prophylaxis   . Leukocytosis   . Tumor 08/18/2018  . Meningioma determined by biopsy of brain ([place]) 08/18/2018  . MACULAR DEGENERATION 02/29/2008  . ALLERGY 02/29/2008  . Nonspecific (abnormal) findings on radiological and other examination of body structure 12/26/2007  . NONSPECIFIC ABNORM FIND RAD&OTH EXAM LUNG FIELD 12/26/2007  . GALLSTONES 10/01/2007  . ELEVATION, TRANSAMINASE/LDH LEVELS 09/06/2007  . HEPATITIS NOS 09/01/2007  . ABDOMINAL PAIN, EPIGASTRIC 09/01/2007    Lilyannah Zuelke ,MS, CCC-SLP  12/02/2018, 1:40 PM  [facility] [address] [place], [place], [address] Phone: [phone]   Fax:  [phone]   Name: James Olsen MRN: [id number] Date of Birth: 12/20/1946

## 2018-12-02 NOTE — Progress Notes (Signed)
Brain and Spine Tumor Board Documentation  James Olsen was presented by Cecil Cobbs, MD at Brain and Spine Tumor Board on 12/02/2018, which included representatives from neuro oncology, radiation oncology, surgical oncology, radiology, pathology, navigation.  James Olsen was presented as a new patient with history of the following treatments:  .  Additionally, we reviewed previous medical and familial history, history of present illness, and recent lab results along with all available histopathologic and imaging studies. The tumor board considered available treatment options and made the following recommendations:  Radiation therapy (primary modality) external beam RT  Tumor board is a meeting of clinicians from various specialty areas who evaluate and discuss patients for whom a multidisciplinary approach is being considered. Final determinations in the plan of care are those of the provider(s). The responsibility for follow up of recommendations given during tumor board is that of the provider.   Today's extended care, comprehensive team conference, James Olsen was not present for the discussion and was not examined.

## 2018-12-02 NOTE — Therapy (Signed)
[facility] [address] [place], [place], [address] Phone: [phone]   Fax:  [phone]  Occupational Therapy Treatment  Patient Details  Name: James Olsen MRN: [id number] Date of Birth: 12-24-1945 No data recorded  Encounter Date: 12/02/2018  OT End of Session - 12/02/18 1021    Visit Number  23    Number of Visits  25    Date for OT Re-Evaluation  12/06/18    Authorization Type  cert. 09/07/18-12/06/18    Authorization Time Period  Medicare & [employer], covered 100%    Authorization - Visit Number  23    Authorization - Number of Visits  30    OT Start Time  1021    OT Stop Time  1105    OT Time Calculation (min)  44 min    Activity Tolerance  Patient tolerated treatment well    Behavior During Therapy  WFL for tasks assessed/performed       Past Medical History:  Diagnosis Date  . Arthritis   . GERD (gastroesophageal reflux disease)   . Glaucoma    pt denies.   . History of blood transfusion     5 units after previous surgery  . Macular degeneration   . Stroke [facility])    after surgery in August 2019 mostly recovered still some weakness in left arm  . Wears glasses     Past Surgical History:  Procedure Laterality Date  . CHOLECYSTECTOMY  2008   lap choli  . COLONOSCOPY    . CRANIOPLASTY N/A 10/29/2018   Procedure: CRANIOPLASTY;  Surgeon: [name], MD;  Location: [facility];  Service: Neurosurgery;  Laterality: N/A;  CRANIOPLASTY  . CRANIOTOMY N/A 08/18/2018   Procedure: CRANIOTOMY TUMOR EXCISION;  Surgeon: [name], MD;  Location: [facility];  Service: Neurosurgery;  Laterality: N/A;  CRANIOTOMY TUMOR EXCISION  . NAILBED REPAIR Left 05/04/2014   Procedure: LEFT INDEX NAIL ABLATION V-Y FLAP COVERAGE;  Surgeon: [name]., MD;  Location: [facility];  Service: Orthopedics;  Laterality: Left;  index  . PILONIDAL CYST EXCISION     x2  . TONSILLECTOMY    . VASECTOMY      There  were no vitals filed for this visit.  Subjective Assessment - 12/02/18 1021    Subjective   Pt reports that after he does ex, his hand is more flexible during the day and he is able to close hand more    Pertinent History  Meningioma s/p craniotomy for tumor resection 08/18/18, Acute cerebral infarction; arthritis, macular degeneration, hepatitis, GERD    Patient Stated Goals  be back to normal or 95%    Currently in Pain?  No/denies       Placing O-connor pegs in pegboard with tweezers with min difficulty.    Checked remaining goals and discussed progress.--see LTGs below.  Picking up blocks using gripper set on level 3 to place in container at midlevel reaching for incr strength and endurance.  Pt with mod difficulty/drops due to fatigue/tremors with short rest breaks.   Ambulating with alternating tossing 1 scarf in each hand with min-mod difficulty.  Pt with difficulty opening left hand while closing R hand.  In standing, functional reaching to place small pegs in vertical pegboard to copy design while simultaneously placing in pegs with both hands for incr difficulty with min difficulty for coordination/cognitive component.   Pt able to carry 5lb bag of "groceries" and lift to chest level,  place/retrieve 2lb and 3lb wt on overhead shelf x3 with good control.          OT Short Term Goals - 09/28/18 0753      OT SHORT TERM GOAL #1   Title  Pt will be independent with initial HEP.--check STG 10/22/18    Time  6    Period  Weeks    Status  Achieved      OT SHORT TERM GOAL #2   Title  Pt will be able to bathe mod I.    Time  6    Period  Weeks    Status  Achieved      OT SHORT TERM GOAL #3   Title  Pt will be able to complete clothing fasteners mod I.    Time  6    Period  Weeks    Status  Achieved   09/28/18:  incr time     OT SHORT TERM GOAL #4   Title  Pt will be able to cut food mod I.    Time  6    Period  Weeks    Status  Achieved   09/28/18:  able to perform  with fork/knife but no chopping/peeling     OT SHORT TERM GOAL #5   Title  Pt will demo at least 60* R shoulder flex with only min compensation for improved functional reach.    Baseline  45* with mod compensation    Period  Weeks    Status  Achieved   09/28/18:  WNL     OT SHORT TERM GOAL #6   Title  Pt will be able to use RUE as stabilizer consistently for ADLs.    Time  6    Period  Weeks    Status  Achieved        OT Long Term Goals - 12/02/18 1023      OT LONG TERM GOAL #1   Title  Pt will be independent with updated HEP.--check LTGs 12/06/18    Time  12    Period  Weeks    Status  Achieved   11/29/18     OT LONG TERM GOAL #2   Title  Pt will perform simple cooking tasks mod I.--09/28/18 upgraded to mod complex cooking involving carrying pots/pans and chopping/peeling with RUE    Time  12    Period  Weeks    Status  Achieved   09/28/18:  met initial goal and upgraded.  11/22/18:  met, able to chop/peel and carry most pots/pans     OT LONG TERM GOAL #3   Title  Pt will perform mod complex home maintenance tasks mod I.    Time  12    Period  Weeks    Status  Achieved   10/25/18:  yardwork     OT LONG TERM GOAL #4   Title  Pt will use RUE as nondominant assist at least 75% of the time for ADLs.---upgraded 09/28/18:  will use RUE 100% of the time for previous ADL/IADL tasks.    Time  8    Period  Weeks    Status  Achieved   09/28/18:  met initial goal and upgraded.   11/22/18:  met     OT LONG TERM GOAL #5   Title  Pt will demo at least 90* R shoulder flex for functional reach with min compensation.  09/28/18:  Pt will be able to retrieve 2-3lb object from overhead shelf with  good control x3.      Period  Weeks    Status  Achieved   09/28/18:  initial goal met and upgraded.  12/02/18  met with 3lbs     OT LONG TERM GOAL #6   Title  Pt will be able to carry light bag of groceries with RUE.--09/28/18  upgrade to 5lb bag of groceries    Time  12    Period  Weeks    Status   Achieved   09/28/18:  met initial goal and upgraded.  12/02/18:  also able to lift to chest height     OT LONG TERM GOAL #7   Title  Pt will demo improved coordination/RUE functional reach as shown by scoring at least 30 on box and blocks test.--09/28/18:  upgrade to 40 blocks    Time  12    Period  Weeks    Status  Achieved   09/28/18:  30 blocks.  10/25/18:  41 blocks     OT LONG TERM GOAL #8   Title  Pt will demo at least 35lbs R grip strength for opening containers/carrying objects.--09/28/18 upgrade to 45lbs     Baseline  22lbs    Time  12    Period  Weeks    Status  Partially Met   09/28/18:  34lbs.  12/02/18:  44lbs     OT LONG TERM GOAL  #9   Baseline  Pt will improve coordination with R dominant UE as shown by completing 9-hole peg test in less than 60sec.  Revised:  10/25/18 to less than 45sec    Time  12    Period  Weeks    Status  Achieved   Goal added 09/28/18 (baseline 102.28sec).  10/25/18:  Met & revised 58.60sec.  12/02/18:  44.53sec           Plan - 12/02/18 1022    Clinical Impression Statement  Pt has made excellent progress with RUE functional use.     Occupational Profile and client history currently impacting functional performance  Pt was very independent and active prior to surgery.  Pt drove, performed yardwork, most cooking/home maintenance tasks, and enjoyed outdoor leisure activities.  Pt is currently not able to drive, perform yardwork, perform previous cooking/home maintenance tasks or leisure tasks.  Pt's goal is to return to 90-100% of "normal"    Occupational performance deficits (Please refer to evaluation for details):  ADL's;IADL's;Social Participation;Leisure    Rehab Potential  Good    OT Frequency  2x / week    OT Duration  12 weeks    OT Treatment/Interventions  Self-care/ADL training;Cryotherapy;Paraffin;Therapeutic exercise;DME and/or AE instruction;Functional Mobility Training;Cognitive remediation/compensation;Balance  training;Splinting;Manual Therapy;Neuromuscular education;Fluidtherapy;Ultrasound;Electrical Stimulation;Aquatic Therapy;Moist Heat;Energy conservation;Passive range of motion;Therapeutic activities;Patient/family education    Plan  d/c OT, pt to start radiation 12/09/18    OT Home Exercise Plan  Education provided:  edema management, proper positioning of RUE, initial HEP with ball, red putty HEP, coordination HEP    Consulted and Agree with Plan of Care  Patient       Patient will benefit from skilled therapeutic intervention in order to improve the following deficits and impairments:  Abnormal gait, Decreased balance, Decreased endurance, Decreased mobility, Decreased range of motion, Decreased cognition, Decreased activity tolerance, Decreased coordination, Decreased knowledge of use of DME, Decreased strength, Impaired flexibility, Impaired UE functional use  Visit Diagnosis: Muscle weakness (generalized)  Other abnormalities of gait and mobility  Hemiplegia and hemiparesis following cerebral infarction affecting right non-dominant  side ([place])  Abnormal posture  Other lack of coordination  Unsteadiness on feet    Problem List Patient Active Problem List   Diagnosis Date Noted  . 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 Meningioma ([place]) 10/29/2018  . Other abnormalities of gait and mobility 09/17/2018  . Acute cerebral infarction ([place])   . Thrombocytopenia ([place])   . 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 Acute blood loss anemia   . Generalized OA   . Gastroesophageal reflux disease   . Seizure prophylaxis   . Leukocytosis   . Tumor 08/18/2018  . Meningioma determined by biopsy of brain ([place]) 08/18/2018  . MACULAR DEGENERATION 02/29/2008  . ALLERGY 02/29/2008  . Nonspecific (abnormal) findings on radiological and other examination of body structure 12/26/2007  . NONSPECIFIC ABNORM FIND RAD&OTH EXAM LUNG FIELD 12/26/2007   . GALLSTONES 10/01/2007  . ELEVATION, TRANSAMINASE/LDH LEVELS 09/06/2007  . HEPATITIS NOS 09/01/2007  . ABDOMINAL PAIN, EPIGASTRIC 09/01/2007   OCCUPATIONAL THERAPY DISCHARGE SUMMARY  Visits from Start of Care: 23  Current functional level related to goals / functional outcomes: See above   Remaining deficits: Mild decr strength and coordination.  Pt demo incr difficulty with coordination with divided attention   Education / Equipment: Pt instructed in HEP and how to progress.  Pt verbalized understanding of all education provided.  Plan: Patient agrees to discharge.  Patient goals were met. Patient is being discharged due to meeting the stated rehab goals.  And due to pt starting radiation next week.  ?????        [facility] 12/02/2018, 3:33 PM  [facility] [address] [place], [place], [address] Phone: [phone]   Fax:  [phone]  Name: James Olsen MRN: [id number] Date of Birth: 10/10/1946   Vianne Bulls, OTR/L [facility] [address] [place], [place]  [address] [phone] phone [phone] 12/02/18 3:33 PM

## 2018-12-06 ENCOUNTER — Encounter: Payer: Medicare Other | Admitting: Occupational Therapy

## 2018-12-06 ENCOUNTER — Encounter: Payer: Medicare Other | Admitting: Speech Pathology

## 2018-12-07 DIAGNOSIS — Z510 Encounter for antineoplastic radiation therapy: Secondary | ICD-10-CM | POA: Diagnosis not present

## 2018-12-09 ENCOUNTER — Ambulatory Visit
Admission: RE | Admit: 2018-12-09 | Discharge: 2018-12-09 | Disposition: A | Discharge status: Home / Self Care | Payer: Medicare Other | Source: Ambulatory Visit | Attending: Radiation Oncology | Admitting: Radiation Oncology

## 2018-12-09 ENCOUNTER — Encounter: Payer: Medicare Other | Admitting: Occupational Therapy

## 2018-12-09 DIAGNOSIS — Z510 Encounter for antineoplastic radiation therapy: Secondary | ICD-10-CM | POA: Diagnosis not present

## 2018-12-09 DIAGNOSIS — D329 Benign neoplasm of meninges, unspecified: Secondary | ICD-10-CM

## 2018-12-09 MED ORDER — SONAFINE EX EMUL
1.0000 "application " | Freq: Once | CUTANEOUS | Status: AC
  Administered 2018-12-09: 1 via TOPICAL

## 2018-12-09 NOTE — Progress Notes (Signed)
Pt here for patient teaching.  Pt given Radiation and You booklet, skin care instructions and Sonafine.  Reviewed areas of pertinence such as fatigue, hair loss and nausea and vomiting . Pt able to give teach back of to pat skin, use unscented/gentle soap and drink plenty of water,apply Sonafine bid and avoid applying anything to skin within 4 hours of treatment. Pt verbalizes understanding of information given and will contact nursing with any questions or concerns.     [email or web address]

## 2018-12-10 ENCOUNTER — Ambulatory Visit
Admission: RE | Admit: 2018-12-10 | Discharge: 2018-12-10 | Disposition: A | Discharge status: Home / Self Care | Payer: Medicare Other | Source: Ambulatory Visit | Attending: Radiation Oncology | Admitting: Radiation Oncology

## 2018-12-10 DIAGNOSIS — Z510 Encounter for antineoplastic radiation therapy: Secondary | ICD-10-CM | POA: Diagnosis not present

## 2018-12-13 ENCOUNTER — Encounter: Payer: Medicare Other | Admitting: Occupational Therapy

## 2018-12-13 ENCOUNTER — Ambulatory Visit
Admission: RE | Admit: 2018-12-13 | Discharge: 2018-12-13 | Disposition: A | Discharge status: Home / Self Care | Payer: Medicare Other | Source: Ambulatory Visit | Attending: Radiation Oncology | Admitting: Radiation Oncology

## 2018-12-13 DIAGNOSIS — Z510 Encounter for antineoplastic radiation therapy: Secondary | ICD-10-CM | POA: Diagnosis not present

## 2018-12-14 ENCOUNTER — Ambulatory Visit
Admission: RE | Admit: 2018-12-14 | Discharge: 2018-12-14 | Disposition: A | Discharge status: Home / Self Care | Payer: Medicare Other | Source: Ambulatory Visit | Attending: Radiation Oncology | Admitting: Radiation Oncology

## 2018-12-14 DIAGNOSIS — Z510 Encounter for antineoplastic radiation therapy: Secondary | ICD-10-CM | POA: Diagnosis not present

## 2018-12-16 ENCOUNTER — Ambulatory Visit
Admission: RE | Admit: 2018-12-16 | Discharge: 2018-12-16 | Disposition: A | Discharge status: Home / Self Care | Payer: Medicare Other | Source: Ambulatory Visit | Attending: Radiation Oncology | Admitting: Radiation Oncology

## 2018-12-16 DIAGNOSIS — Z510 Encounter for antineoplastic radiation therapy: Secondary | ICD-10-CM | POA: Diagnosis not present

## 2018-12-17 ENCOUNTER — Encounter: Payer: Medicare Other | Admitting: Occupational Therapy

## 2018-12-17 ENCOUNTER — Ambulatory Visit
Admission: RE | Admit: 2018-12-17 | Discharge: 2018-12-17 | Disposition: A | Discharge status: Home / Self Care | Payer: Medicare Other | Source: Ambulatory Visit | Attending: Radiation Oncology | Admitting: Radiation Oncology

## 2018-12-17 DIAGNOSIS — Z510 Encounter for antineoplastic radiation therapy: Secondary | ICD-10-CM | POA: Diagnosis not present

## 2018-12-20 ENCOUNTER — Encounter: Payer: Medicare Other | Admitting: Occupational Therapy

## 2018-12-20 ENCOUNTER — Ambulatory Visit
Admission: RE | Admit: 2018-12-20 | Discharge: 2018-12-20 | Disposition: A | Discharge status: Home / Self Care | Payer: Medicare Other | Source: Ambulatory Visit | Attending: Radiation Oncology | Admitting: Radiation Oncology

## 2018-12-20 DIAGNOSIS — Z510 Encounter for antineoplastic radiation therapy: Secondary | ICD-10-CM | POA: Diagnosis not present

## 2018-12-21 ENCOUNTER — Ambulatory Visit
Admission: RE | Admit: 2018-12-21 | Discharge: 2018-12-21 | Disposition: A | Discharge status: Home / Self Care | Payer: Medicare Other | Source: Ambulatory Visit | Attending: Radiation Oncology | Admitting: Radiation Oncology

## 2018-12-21 DIAGNOSIS — Z510 Encounter for antineoplastic radiation therapy: Secondary | ICD-10-CM | POA: Diagnosis not present

## 2018-12-23 ENCOUNTER — Ambulatory Visit
Admission: RE | Admit: 2018-12-23 | Discharge: 2018-12-23 | Disposition: A | Discharge status: Home / Self Care | Payer: Medicare Other | Source: Ambulatory Visit | Attending: Radiation Oncology | Admitting: Radiation Oncology

## 2018-12-23 DIAGNOSIS — D420 Neoplasm of uncertain behavior of cerebral meninges: Secondary | ICD-10-CM | POA: Diagnosis not present

## 2018-12-23 DIAGNOSIS — Z510 Encounter for antineoplastic radiation therapy: Secondary | ICD-10-CM | POA: Diagnosis present

## 2018-12-24 ENCOUNTER — Ambulatory Visit
Admission: RE | Admit: 2018-12-24 | Discharge: 2018-12-24 | Disposition: A | Discharge status: Home / Self Care | Payer: Medicare Other | Source: Ambulatory Visit | Attending: Radiation Oncology | Admitting: Radiation Oncology

## 2018-12-24 DIAGNOSIS — Z510 Encounter for antineoplastic radiation therapy: Secondary | ICD-10-CM | POA: Diagnosis not present

## 2018-12-27 ENCOUNTER — Ambulatory Visit
Admission: RE | Admit: 2018-12-27 | Discharge: 2018-12-27 | Disposition: A | Discharge status: Home / Self Care | Payer: Medicare Other | Source: Ambulatory Visit | Attending: Radiation Oncology | Admitting: Radiation Oncology

## 2018-12-27 DIAGNOSIS — Z510 Encounter for antineoplastic radiation therapy: Secondary | ICD-10-CM | POA: Diagnosis not present

## 2018-12-28 ENCOUNTER — Ambulatory Visit
Admission: RE | Admit: 2018-12-28 | Discharge: 2018-12-28 | Disposition: A | Discharge status: Home / Self Care | Payer: Medicare Other | Source: Ambulatory Visit | Attending: Radiation Oncology | Admitting: Radiation Oncology

## 2018-12-28 DIAGNOSIS — Z510 Encounter for antineoplastic radiation therapy: Secondary | ICD-10-CM | POA: Diagnosis not present

## 2018-12-29 ENCOUNTER — Ambulatory Visit
Admission: RE | Admit: 2018-12-29 | Discharge: 2018-12-29 | Disposition: A | Discharge status: Home / Self Care | Payer: Medicare Other | Source: Ambulatory Visit | Attending: Radiation Oncology | Admitting: Radiation Oncology

## 2018-12-29 DIAGNOSIS — Z510 Encounter for antineoplastic radiation therapy: Secondary | ICD-10-CM | POA: Diagnosis not present

## 2018-12-30 ENCOUNTER — Ambulatory Visit
Admission: RE | Admit: 2018-12-30 | Discharge: 2018-12-30 | Disposition: A | Discharge status: Home / Self Care | Payer: Medicare Other | Source: Ambulatory Visit | Attending: Radiation Oncology | Admitting: Radiation Oncology

## 2018-12-30 DIAGNOSIS — Z510 Encounter for antineoplastic radiation therapy: Secondary | ICD-10-CM | POA: Diagnosis not present

## 2018-12-31 ENCOUNTER — Ambulatory Visit
Admission: RE | Admit: 2018-12-31 | Discharge: 2018-12-31 | Disposition: A | Discharge status: Home / Self Care | Payer: Medicare Other | Source: Ambulatory Visit | Attending: Radiation Oncology | Admitting: Radiation Oncology

## 2018-12-31 DIAGNOSIS — Z510 Encounter for antineoplastic radiation therapy: Secondary | ICD-10-CM | POA: Diagnosis not present

## 2019-01-03 ENCOUNTER — Ambulatory Visit
Admission: RE | Admit: 2019-01-03 | Discharge: 2019-01-03 | Disposition: A | Discharge status: Home / Self Care | Payer: Medicare Other | Source: Ambulatory Visit | Attending: Radiation Oncology | Admitting: Radiation Oncology

## 2019-01-03 DIAGNOSIS — Z510 Encounter for antineoplastic radiation therapy: Secondary | ICD-10-CM | POA: Diagnosis not present

## 2019-01-04 ENCOUNTER — Ambulatory Visit
Admission: RE | Admit: 2019-01-04 | Discharge: 2019-01-04 | Disposition: A | Discharge status: Home / Self Care | Payer: Medicare Other | Source: Ambulatory Visit | Attending: Radiation Oncology | Admitting: Radiation Oncology

## 2019-01-04 DIAGNOSIS — Z510 Encounter for antineoplastic radiation therapy: Secondary | ICD-10-CM | POA: Diagnosis not present

## 2019-01-05 ENCOUNTER — Ambulatory Visit
Admission: RE | Admit: 2019-01-05 | Discharge: 2019-01-05 | Disposition: A | Discharge status: Home / Self Care | Payer: Medicare Other | Source: Ambulatory Visit | Attending: Radiation Oncology | Admitting: Radiation Oncology

## 2019-01-05 DIAGNOSIS — Z510 Encounter for antineoplastic radiation therapy: Secondary | ICD-10-CM | POA: Diagnosis not present

## 2019-01-06 ENCOUNTER — Ambulatory Visit
Admission: RE | Admit: 2019-01-06 | Discharge: 2019-01-06 | Disposition: A | Discharge status: Home / Self Care | Payer: Medicare Other | Source: Ambulatory Visit | Attending: Radiation Oncology | Admitting: Radiation Oncology

## 2019-01-06 DIAGNOSIS — Z510 Encounter for antineoplastic radiation therapy: Secondary | ICD-10-CM | POA: Diagnosis not present

## 2019-01-07 ENCOUNTER — Ambulatory Visit
Admission: RE | Admit: 2019-01-07 | Discharge: 2019-01-07 | Disposition: A | Discharge status: Home / Self Care | Payer: Medicare Other | Source: Ambulatory Visit | Attending: Radiation Oncology | Admitting: Radiation Oncology

## 2019-01-07 DIAGNOSIS — Z510 Encounter for antineoplastic radiation therapy: Secondary | ICD-10-CM | POA: Diagnosis not present

## 2019-01-10 ENCOUNTER — Ambulatory Visit
Admission: RE | Admit: 2019-01-10 | Discharge: 2019-01-10 | Disposition: A | Discharge status: Home / Self Care | Payer: Medicare Other | Source: Ambulatory Visit | Attending: Radiation Oncology | Admitting: Radiation Oncology

## 2019-01-10 DIAGNOSIS — Z510 Encounter for antineoplastic radiation therapy: Secondary | ICD-10-CM | POA: Diagnosis not present

## 2019-01-11 ENCOUNTER — Ambulatory Visit
Admission: RE | Admit: 2019-01-11 | Discharge: 2019-01-11 | Disposition: A | Discharge status: Home / Self Care | Payer: Medicare Other | Source: Ambulatory Visit | Attending: Radiation Oncology | Admitting: Radiation Oncology

## 2019-01-11 DIAGNOSIS — Z510 Encounter for antineoplastic radiation therapy: Secondary | ICD-10-CM | POA: Diagnosis not present

## 2019-01-12 ENCOUNTER — Ambulatory Visit
Admission: RE | Admit: 2019-01-12 | Discharge: 2019-01-12 | Disposition: A | Discharge status: Home / Self Care | Payer: Medicare Other | Source: Ambulatory Visit | Attending: Radiation Oncology | Admitting: Radiation Oncology

## 2019-01-12 DIAGNOSIS — Z510 Encounter for antineoplastic radiation therapy: Secondary | ICD-10-CM | POA: Diagnosis not present

## 2019-01-13 ENCOUNTER — Ambulatory Visit
Admission: RE | Admit: 2019-01-13 | Discharge: 2019-01-13 | Disposition: A | Discharge status: Home / Self Care | Payer: Medicare Other | Source: Ambulatory Visit | Attending: Radiation Oncology | Admitting: Radiation Oncology

## 2019-01-13 DIAGNOSIS — Z510 Encounter for antineoplastic radiation therapy: Secondary | ICD-10-CM | POA: Diagnosis not present

## 2019-01-14 ENCOUNTER — Ambulatory Visit
Admission: RE | Admit: 2019-01-14 | Discharge: 2019-01-14 | Disposition: A | Discharge status: Home / Self Care | Payer: Medicare Other | Source: Ambulatory Visit | Attending: Radiation Oncology | Admitting: Radiation Oncology

## 2019-01-14 DIAGNOSIS — Z510 Encounter for antineoplastic radiation therapy: Secondary | ICD-10-CM | POA: Diagnosis not present

## 2019-01-17 ENCOUNTER — Ambulatory Visit
Admission: RE | Admit: 2019-01-17 | Discharge: 2019-01-17 | Disposition: A | Discharge status: Home / Self Care | Payer: Medicare Other | Source: Ambulatory Visit | Attending: Radiation Oncology | Admitting: Radiation Oncology

## 2019-01-17 DIAGNOSIS — Z510 Encounter for antineoplastic radiation therapy: Secondary | ICD-10-CM | POA: Diagnosis not present

## 2019-01-18 ENCOUNTER — Ambulatory Visit
Admission: RE | Admit: 2019-01-18 | Discharge: 2019-01-18 | Disposition: A | Discharge status: Home / Self Care | Payer: Medicare Other | Source: Ambulatory Visit | Attending: Radiation Oncology | Admitting: Radiation Oncology

## 2019-01-18 DIAGNOSIS — Z510 Encounter for antineoplastic radiation therapy: Secondary | ICD-10-CM | POA: Diagnosis not present

## 2019-01-19 ENCOUNTER — Ambulatory Visit
Admission: RE | Admit: 2019-01-19 | Discharge: 2019-01-19 | Disposition: A | Discharge status: Home / Self Care | Payer: Medicare Other | Source: Ambulatory Visit | Attending: Radiation Oncology | Admitting: Radiation Oncology

## 2019-01-19 DIAGNOSIS — Z510 Encounter for antineoplastic radiation therapy: Secondary | ICD-10-CM | POA: Diagnosis not present

## 2019-01-20 ENCOUNTER — Inpatient Hospital Stay: Payer: Medicare Other | Attending: Internal Medicine | Admitting: Internal Medicine

## 2019-01-20 ENCOUNTER — Telehealth: Payer: Self-pay

## 2019-01-20 ENCOUNTER — Encounter: Payer: Self-pay | Admitting: Internal Medicine

## 2019-01-20 ENCOUNTER — Ambulatory Visit
Admission: RE | Admit: 2019-01-20 | Discharge: 2019-01-20 | Disposition: A | Discharge status: Home / Self Care | Payer: Medicare Other | Source: Ambulatory Visit | Attending: Radiation Oncology | Admitting: Radiation Oncology

## 2019-01-20 VITALS — BP 129/76 | HR 75 | Temp 97.9°F | Resp 18 | Ht 69.5 in | Wt 197.8 lb

## 2019-01-20 DIAGNOSIS — D320 Benign neoplasm of cerebral meninges: Secondary | ICD-10-CM | POA: Diagnosis present

## 2019-01-20 DIAGNOSIS — Z510 Encounter for antineoplastic radiation therapy: Secondary | ICD-10-CM | POA: Diagnosis not present

## 2019-01-20 DIAGNOSIS — D420 Neoplasm of uncertain behavior of cerebral meninges: Secondary | ICD-10-CM

## 2019-01-20 NOTE — Progress Notes (Signed)
[facility] [place], [place] [address] [phone]   New Patient Evaluation  Date of Service: 01/20/19 Patient Name: James Olsen Patient MRN: [id number] Patient DOB: 12-Oct-1946 Provider: [name], MD  Identifying Statement:  James Olsen is a 73 y.o. male with left parietal meningioma who presents for initial consultation and evaluation.    Referring Provider: [name], MD [address] [place], [place] [address]  Oncologic History: 10/29/18: Craniotomy, resection Christella Noa) 12/09/18: Starts IMRT Isidore Moos)  History of Present Illness: The patient's records from the referring physician were obtained and reviewed and the patient interviewed to confirm this HPI.  James Olsen presented to medical attention in the winter of 2019 after noticing a "bulging" coming from the top of his head.  He also had experienced some numbness of his left leg at the time.  He obtained an MRI which demonstrated a dural based mass extending through the skull.  After a period of monitoring he underwent resection on 10/29/18 with Dr. Christella Noa.  Post-operatively he was very weak on the right side and was found to have a stroke.  After extensive rehabilitation he regained most of his right sided function.  Because of residual tumor and histology (WHO II) radiation was started on 12/19 with Dr. Isidore Moos.  Next week is his final session which he has tolerated without ill effect.  Medications: Current Outpatient Medications on File Prior to Visit  Medication Sig Dispense Refill  . Multiple Vitamins-Minerals (PRESERVISION AREDS 2) CAPS Take 1 capsule by mouth 2 (two) times daily.     . ranitidine (ZANTAC) 150 MG tablet Take 150 mg by mouth daily as needed for heartburn.     No current facility-administered medications on file prior to visit.     Allergies: No Known Allergies Past Medical History:  Past Medical History:  Diagnosis  Date  . Arthritis   . GERD (gastroesophageal reflux disease)   . Glaucoma    pt denies.   . History of blood transfusion     5 units after previous surgery  . Macular degeneration   . Stroke [facility])    after surgery in August 2019 mostly recovered still some weakness in left arm  . Wears glasses    Past Surgical History:  Past Surgical History:  Procedure Laterality Date  . CHOLECYSTECTOMY  2008   lap choli  . COLONOSCOPY    . CRANIOPLASTY N/A 10/29/2018   Procedure: CRANIOPLASTY;  Surgeon: [name], MD;  Location: [place];  Service: Neurosurgery;  Laterality: N/A;  CRANIOPLASTY  . CRANIOTOMY N/A 08/18/2018   Procedure: CRANIOTOMY TUMOR EXCISION;  Surgeon: [name], MD;  Location: [place];  Service: Neurosurgery;  Laterality: N/A;  CRANIOTOMY TUMOR EXCISION  . NAILBED REPAIR Left 05/04/2014   Procedure: LEFT INDEX NAIL ABLATION V-Y FLAP COVERAGE;  Surgeon: [name]., MD;  Location: [place];  Service: Orthopedics;  Laterality: Left;  index  . PILONIDAL CYST EXCISION     x2  . TONSILLECTOMY    . VASECTOMY     Social History:  Social History   Socioeconomic History  . Marital status: Married    Spouse name: Not on file  . Number of children: Not on file  . Years of education: Not on file  . Highest education level: Not on file  Occupational History  . Not on file  Social Needs  . Financial resource strain: Not  on file  . Food insecurity:    Worry: Not on file    Inability: Not on file  . Transportation needs:    Medical: No    Non-medical: No  Tobacco Use  . Smoking status: Never Smoker  . Smokeless tobacco: Never Used  Substance and Sexual Activity  . Alcohol use: Yes    Comment: occasionally  . Drug use: No  . Sexual activity: Not on file  Lifestyle  . Physical activity:    Days per week: Not on file    Minutes per session: Not on file  . Stress: Not on file  Relationships  . Social connections:    Talks on phone: Not on file      Gets together: Not on file    Attends religious service: Not on file    Active member of club or organization: Not on file    Attends meetings of clubs or organizations: Not on file    Relationship status: Not on file  . Intimate partner violence:    Fear of current or ex partner: No    Emotionally abused: No    Physically abused: No    Forced sexual activity: No  Other Topics Concern  . Not on file  Social History Narrative  . Not on file   Family History:  Family History  Problem Relation Age of Onset  . Diabetes Mother   . Skin cancer Brother   . Congenital heart disease Brother     Review of Systems: Constitutional: Denies fevers, chills or abnormal weight loss Eyes: Denies blurriness of vision Ears, nose, mouth, throat, and face: Denies mucositis or sore throat Respiratory: Denies cough, dyspnea or wheezes Cardiovascular: Denies palpitation, chest discomfort or lower extremity swelling Gastrointestinal:  Denies nausea, constipation, diarrhea GU: Denies dysuria or incontinence Skin: Denies abnormal skin rashes Neurological: Per HPI Musculoskeletal: Denies joint pain, back or neck discomfort. No decrease in ROM Behavioral/Psych: Denies anxiety, disturbance in thought content, and mood instability  Physical Exam: Vitals:   01/20/19 1000  BP: 129/76  Pulse: 75  Resp: 18  Temp: 97.9 F (36.6 C)  SpO2: 99%   KPS: 90. General: Alert, cooperative, pleasant, in no acute distress Head: Craniotomy scar noted, dry and intact. EENT: No conjunctival injection or scleral icterus. Oral mucosa moist Lungs: Resp effort normal Cardiac: Regular rate and rhythm Abdomen: Soft, non-distended abdomen Skin: No rashes cyanosis or petechiae. Extremities: No clubbing or edema  Neurologic Exam: Mental Status: Awake, alert, attentive to examiner. Oriented to self and environment. Language is fluent with intact comprehension.  Cranial Nerves: Visual acuity is grossly normal.  Visual fields are full. Extra-ocular movements intact. No ptosis. Face is symmetric, tongue midline. Motor: Tone and bulk are normal. Power is full in both arms and legs. Reflexes are symmetric, no pathologic reflexes present. Intact finger to nose bilaterally Sensory: Intact to light touch and temperature Gait: Normal and tandem gait is normal.   Labs: I have reviewed the data as listed    Component Value Date/Time   NA 141 08/30/2018 0524   K 3.7 08/30/2018 0524   CL 107 08/30/2018 0524   CO2 26 08/30/2018 0524   GLUCOSE 116 (H) 08/30/2018 0524   BUN 16 08/30/2018 0524   CREATININE 0.95 11/26/2018 1231   CALCIUM 8.5 (L) 08/30/2018 0524   PROT 5.5 (L) 08/25/2018 0928   ALBUMIN 2.9 (L) 08/25/2018 0928   AST 22 08/25/2018 0928   ALT 45 (H) 08/25/2018 0928   ALKPHOS  51 08/25/2018 0928   BILITOT 0.8 08/25/2018 0928   GFRNONAA >60 11/26/2018 1231   GFRAA >60 11/26/2018 1231   Lab Results  Component Value Date   WBC 6.3 10/22/2018   NEUTROABS 11.4 (H) 08/25/2018   HGB 13.5 10/22/2018   HCT 43.5 10/22/2018   MCV 93.1 10/22/2018   PLT 171 10/22/2018    Pathology:   Assessment/Plan 1. Atypical meningioma of brain [facility])  We appreciate the opportunity to participate in the care of [employer].  He is clinically stable today now almost having completed IMRT radiotherapy to residual tumor.  Today we reviewed his tumor histology, prognosis, neurologic deficits, and justification for careful monitoring.  He has regained almost all motor function affected by intraoperative infarct.    Screening for potential clinical trials was performed and discussed using eligibility criteria for active protocols at [facility], loco-regional tertiary centers, as well as national database available on [email or web address].    The patient is not a candidate for a research protocol at this time due to patient preference.   He should return to clinic in 3 months with an MRI brain for  evaluation.  We spent twenty additional minutes teaching regarding the natural history, biology, and historical experience in the treatment of brain tumors. We then discussed in detail the current recommendations for therapy focusing on the mode of administration, mechanism of action, anticipated toxicities, and quality of life issues associated with this plan. We also provided teaching sheets for the patient to take home as an additional resource.  All questions were answered. The patient knows to call the clinic with any problems, questions or concerns. No barriers to learning were detected.  The total time spent in the encounter was 45 minutes and more than 50% was on counseling and review of test results   [name], MD Medical Director of Neuro-Oncology [facility] [place] 01/20/19 10:58 AM

## 2019-01-20 NOTE — Telephone Encounter (Signed)
Printed avs and calender of upcoming appointment. Per 1/30 los 

## 2019-01-21 ENCOUNTER — Ambulatory Visit
Admission: RE | Admit: 2019-01-21 | Discharge: 2019-01-21 | Disposition: A | Discharge status: Home / Self Care | Payer: Medicare Other | Source: Ambulatory Visit | Attending: Radiation Oncology | Admitting: Radiation Oncology

## 2019-01-21 DIAGNOSIS — Z510 Encounter for antineoplastic radiation therapy: Secondary | ICD-10-CM | POA: Diagnosis not present

## 2019-01-24 ENCOUNTER — Ambulatory Visit
Admission: RE | Admit: 2019-01-24 | Discharge: 2019-01-24 | Disposition: A | Discharge status: Home / Self Care | Payer: Medicare Other | Source: Ambulatory Visit | Attending: Radiation Oncology | Admitting: Radiation Oncology

## 2019-01-24 DIAGNOSIS — Z510 Encounter for antineoplastic radiation therapy: Secondary | ICD-10-CM | POA: Insufficient documentation

## 2019-01-24 DIAGNOSIS — D420 Neoplasm of uncertain behavior of cerebral meninges: Secondary | ICD-10-CM | POA: Diagnosis not present

## 2019-01-28 ENCOUNTER — Encounter: Payer: Self-pay | Admitting: Radiation Oncology

## 2019-01-28 NOTE — Progress Notes (Signed)
  Radiation Oncology         [phone]) [phone] ________________________________  Name: James Olsen MRN: [id number]  Date: 01/28/2019  DOB: 06-09-1946  End of Treatment Note  Diagnosis:   Atypical meningioma of brain     Indication for treatment:  Curative, post op       Radiation treatment dates:   12/09/2018-01/24/2019  Site/dose:     Left sagittal Brain, 1.8 Gy x 31 fractions for a total dose of 55.8 Gy   Beams/energy: IMRT / 6X   Narrative: The patient tolerated radiation treatment relatively well. At the beginning of his treatment, he denied fatigue, HA, visual/auditory changes, fine motor deficits, or cognitive changes. Towards the end of his treatment, he denied fatigue or pain, however, he noted redness to the radiation site. He was using sonafine as instructed. On PE, it was noted erythema over scalp with dryness with skin.   Plan: The patient has completed radiation treatment. The patient will return to radiation oncology clinic for routine followup in one half month. I advised the patient to call or return sooner if any questions or concerns arise that are related to recovery or treatment.  -----------------------------------  Eppie Gibson, MD

## 2019-02-21 ENCOUNTER — Encounter: Payer: Self-pay | Admitting: Radiation Oncology

## 2019-02-21 NOTE — Progress Notes (Signed)
James Olsen presents for follow up of radiation completed 01/24/19 to his left sagittal brain. He will have a MRI of his brain on 04/20/29 ordered by Dr. Mickeal Skinner. He will see Dr. Mickeal Skinner on 04/26/19 and Dr. Christella Noa about a week after that. He denies any side effects of concerns related to radiation. His skin has healed and he reports some alopecia.   BP 125/75   Pulse 68   Temp 98 F (36.7 C) (Oral)   Resp 18   Ht 5\' 10"  (1.778 m)   Wt 197 lb 12.8 oz (89.7 kg)   SpO2 100% Comment: room air  BMI 28.38 kg/m    Wt Readings from Last 3 Encounters:  02/25/19 197 lb 12.8 oz (89.7 kg)  01/20/19 197 lb 12.8 oz (89.7 kg)  11/10/18 201 lb (91.2 kg)

## 2019-02-25 ENCOUNTER — Other Ambulatory Visit: Payer: Self-pay

## 2019-02-25 ENCOUNTER — Ambulatory Visit
Admission: RE | Admit: 2019-02-25 | Discharge: 2019-02-25 | Disposition: A | Discharge status: Home / Self Care | Payer: Medicare Other | Source: Ambulatory Visit | Attending: Radiation Oncology | Admitting: Radiation Oncology

## 2019-02-25 ENCOUNTER — Encounter: Payer: Self-pay | Admitting: Radiation Oncology

## 2019-02-25 VITALS — BP 125/75 | HR 68 | Temp 98.0°F | Resp 18 | Ht 70.0 in | Wt 197.8 lb

## 2019-02-25 DIAGNOSIS — Z923 Personal history of irradiation: Secondary | ICD-10-CM | POA: Diagnosis not present

## 2019-02-25 DIAGNOSIS — D329 Benign neoplasm of meninges, unspecified: Secondary | ICD-10-CM

## 2019-02-25 DIAGNOSIS — D420 Neoplasm of uncertain behavior of cerebral meninges: Secondary | ICD-10-CM

## 2019-02-25 DIAGNOSIS — C700 Malignant neoplasm of cerebral meninges: Secondary | ICD-10-CM | POA: Insufficient documentation

## 2019-02-25 HISTORY — DX: Personal history of irradiation: Z92.3

## 2019-02-25 NOTE — Progress Notes (Signed)
  Radiation Oncology         [phone] ________________________________  Name: James Olsen MRN: [id number]  Date: 02/25/2019  DOB: Feb 12, 1946  Follow-Up Visit Note  Outpatient  CC: [name], MD  [name], MD  Diagnosis:   Atypical meningioma of brain   ICD-10-CM   1. 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[place]) D42.0   2. Meningioma ([place]) D32.9     CHIEF COMPLAINT: Here for follow-up and surveillance of brain cancer  Interval Since Last Radiation:  1 month  12/09/2018 - 01/24/2019: Brain, Left Sagittal / 55.8 Gy in 31 fractions  Narrative:  The patient returns today for routine follow-up. He is scheduled to see Dr. Mickeal Skinner on 04/26/2019 and Dr. Christella Noa about a week following that.       Follow up MRI of the brain is scheduled for 04/21/2019.  Symptomatically, he denies seizures, headaches, nausea, new neurologic deficits, visual changes.    Patient complains of some residual alopecia. He otherwise denies any side effects related to radiation.      ALLERGIES:  has No Known Allergies.  Meds: Current Outpatient Medications  Medication Sig Dispense Refill  . Multiple Vitamins-Minerals (PRESERVISION AREDS 2) CAPS Take 1 capsule by mouth 2 (two) times daily.      No current facility-administered medications for this encounter.     Physical Findings: The patient is in no acute distress. Patient is alert and oriented.  height is 5\' 10"  (1.778 m) and weight is 197 lb 12.8 oz (89.7 kg). His oral temperature is 98 F (36.7 C). His blood pressure is 125/75 and his pulse is 68. His respiration is 18 and oxygen saturation is 100%. .  No significant changes.  General: Alert and oriented, in no acute distress HEENT: Head is normocephalic. Extraocular movements are intact.  Skin: His skin has healed nicely with a little bit of erythema and a few patchy, crusty areas. Neurologic: No obvious focalities. Speech is fluent. Coordination is intact. Psychiatric: Judgment and insight are  intact. Affect is appropriate.   Lab Findings: Lab Results  Component Value Date   WBC 6.3 10/22/2018   HGB 13.5 10/22/2018   HCT 43.5 10/22/2018   MCV 93.1 10/22/2018   PLT 171 10/22/2018    Radiographic Findings: No results found.  Impression/Plan:  Atypical meningioma of brain  Patient will follow up as needed in radiation oncology and continue close follow up with neuro-oncology.  I will review his MRIs with Dr Mickeal Skinner at tumor board conferences.  I spent 20 minutes minutes face to face with the patient and more than 50% of that time was spent in counseling and/or coordination of care. _____________________________________   Eppie Gibson, MD  This document serves as a record of services personally performed by Eppie Gibson, MD. It was created on her behalf by Wilburn Mylar, a trained medical scribe. The creation of this record is based on the scribe's personal observations and the provider's statements to them. This document has been checked and approved by the attending provider.

## 2019-03-07 ENCOUNTER — Encounter: Payer: Medicare Other | Attending: Physical Medicine & Rehabilitation | Admitting: Physical Medicine & Rehabilitation

## 2019-03-07 ENCOUNTER — Other Ambulatory Visit: Payer: Self-pay

## 2019-03-07 ENCOUNTER — Encounter: Payer: Self-pay | Admitting: Physical Medicine & Rehabilitation

## 2019-03-07 VITALS — BP 136/83 | HR 68 | Ht 69.0 in | Wt 198.0 lb

## 2019-03-07 DIAGNOSIS — D320 Benign neoplasm of cerebral meninges: Secondary | ICD-10-CM | POA: Insufficient documentation

## 2019-03-07 DIAGNOSIS — I69351 Hemiplegia and hemiparesis following cerebral infarction affecting right dominant side: Secondary | ICD-10-CM | POA: Diagnosis present

## 2019-03-07 NOTE — Progress Notes (Signed)
Subjective:    Patient ID: James Olsen, male    DOB: 03/26/1946, 73 y.o.   MRN: [id number]  HPI   Mr. Hennington is here in follow-up of his left frontal brain infarct which he suffered after resection of his meningioma. He has continued to make progress although he is getting accustomed to the "new normal". He still struggles with language occasionally and higher level balance and strength especially when he is fatigued.  He continues to engage highly in the social manner and is exercising and getting around daily.   Pain Inventory Average Pain 0 Pain Right Now 0 My pain is no pain  In the last 24 hours, has pain interfered with the following? General activity 0 Relation with others 0 Enjoyment of life 0 What TIME of day is your pain at its worst? no pain Sleep (in general) Good  Pain is worse with: no pain Pain improves with: no pain Relief from Meds: no pain  Mobility walk without assistance  Function retired  Neuro/Psych weakness numbness  Prior Studies Any changes since last visit?  no  Physicians involved in your care Any changes since last visit?  no   Family History  Problem Relation Age of Onset  . Diabetes Mother   . Skin cancer Brother   . Congenital heart disease Brother    Social History   Socioeconomic History  . Marital status: Married    Spouse name: Not on file  . Number of children: Not on file  . Years of education: Not on file  . Highest education level: Not on file  Occupational History  . Not on file  Social Needs  . Financial resource strain: Not on file  . Food insecurity:    Worry: Not on file    Inability: Not on file  . Transportation needs:    Medical: No    Non-medical: No  Tobacco Use  . Smoking status: Never Smoker  . Smokeless tobacco: Never Used  Substance and Sexual Activity  . Alcohol use: Yes    Comment: occasionally  . Drug use: No  . Sexual activity: Not on file  Lifestyle  . Physical activity:   Days per week: Not on file    Minutes per session: Not on file  . Stress: Not on file  Relationships  . Social connections:    Talks on phone: Not on file    Gets together: Not on file    Attends religious service: Not on file    Active member of club or organization: Not on file    Attends meetings of clubs or organizations: Not on file    Relationship status: Not on file  Other Topics Concern  . Not on file  Social History Narrative  . Not on file   Past Surgical History:  Procedure Laterality Date  . CHOLECYSTECTOMY  2008   lap choli  . COLONOSCOPY    . CRANIOPLASTY N/A 10/29/2018   Procedure: CRANIOPLASTY;  Surgeon: [name], MD;  Location: [place];  Service: Neurosurgery;  Laterality: N/A;  CRANIOPLASTY  . CRANIOTOMY N/A 08/18/2018   Procedure: CRANIOTOMY TUMOR EXCISION;  Surgeon: [name], MD;  Location: [facility] [place];  Service: Neurosurgery;  Laterality: N/A;  CRANIOTOMY TUMOR EXCISION  . NAILBED REPAIR Left 05/04/2014   Procedure: LEFT INDEX NAIL ABLATION V-Y FLAP COVERAGE;  Surgeon: [name]., MD;  Location: [place];  Service: Orthopedics;  Laterality: Left;  index  . PILONIDAL CYST EXCISION  x2  . TONSILLECTOMY    . VASECTOMY     Past Medical History:  Diagnosis Date  . Arthritis   . GERD (gastroesophageal reflux disease)   . Glaucoma    pt denies.   . History of blood transfusion     5 units after previous surgery  . History of radiation therapy 12/09/18- 01/24/2019   Left sagittal brain, 11.8 Gy in 31 fractions for a total dose of 55.8 Gy  . Macular degeneration   . Stroke [facility])    after surgery in August 2019 mostly recovered still some weakness in left arm  . Wears glasses    BP 136/83   Pulse 68   Ht 5\' 9"  (1.753 m)   Wt 198 lb (89.8 kg)   SpO2 97%   BMI 29.24 kg/m   Opioid Risk Score:   Fall Risk Score:  `1  Depression screen PHQ 2/9  Depression screen PHQ 2/9 09/13/2018  Decreased Interest 0  Down, Depressed,  Hopeless 0  PHQ - 2 Score 0  Altered sleeping 1  Tired, decreased energy 0  Change in appetite 0  Feeling bad or failure about yourself  0  Trouble concentrating 0  Moving slowly or fidgety/restless 0  Suicidal thoughts 0  PHQ-9 Score 1  Difficult doing work/chores Not difficult at all    Review of Systems  Constitutional: Negative.   HENT: Negative.   Eyes: Negative.   Respiratory: Negative.   Cardiovascular: Negative.   Gastrointestinal: Negative.   Endocrine: Negative.   Genitourinary: Negative.   Musculoskeletal: Negative.   Skin: Negative.   Allergic/Immunologic: Negative.   Neurological: Positive for weakness and numbness.  Hematological: Negative.   Psychiatric/Behavioral: Negative.   All other systems reviewed and are negative.      Objective:   Physical Exam General: No acute distress HEENT: EOMI, oral membranes moist Cards: reg rate  Chest: normal effort Abdomen: Soft, NT, ND Skin: dry, intact Extremities: no edema Neuro:Strength is nearly 5 out of 5 in all 4 limbs.  He has functional balance.  Language is improved for sentence length information.  Able to convey her thoughts and ideas easily.  Occasionally will get caught in a more complex or lengthy thought..   Musculoskeletal:Full range of motion without pain.  E Psych:  Pleasant as always     Assessment & Plan:  1. 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 -Maintain HEP  -has made excellent progressthus far.  I told him that he should continue to expect further improvement over the next 6 to 18 months especially from a language standpoint.  Advised him to continue to engage from a his goal and language standpoint moving forward. 2. Pain Management: not having pain at present 3. Seizure prophylaxis: off keppra 4. Early extensor tone RLE:  -resolved 5. Urinary frequency: Resolved    10 minutes was spent with the patient  in direct consultation and examination today. Follow up with me prn.

## 2019-03-07 NOTE — Patient Instructions (Signed)
PLEASE FEEL FREE TO CALL OUR OFFICE WITH ANY PROBLEMS OR QUESTIONS ([phone])     CONTINUE TO KEEP YOUR ACTIVITY, EXERCISES AS YOU'RE DOING

## 2019-03-21 ENCOUNTER — Other Ambulatory Visit: Payer: Self-pay | Admitting: Radiation Therapy

## 2019-03-21 DIAGNOSIS — D329 Benign neoplasm of meninges, unspecified: Secondary | ICD-10-CM

## 2019-03-28 ENCOUNTER — Encounter: Payer: Self-pay | Admitting: Internal Medicine

## 2019-03-31 ENCOUNTER — Other Ambulatory Visit: Payer: Self-pay | Admitting: *Deleted

## 2019-03-31 DIAGNOSIS — D420 Neoplasm of uncertain behavior of cerebral meninges: Secondary | ICD-10-CM

## 2019-04-07 ENCOUNTER — Encounter: Payer: Self-pay | Admitting: Internal Medicine

## 2019-04-18 ENCOUNTER — Encounter: Payer: Self-pay | Admitting: Internal Medicine

## 2019-04-19 ENCOUNTER — Encounter: Payer: Self-pay | Admitting: Internal Medicine

## 2019-04-20 ENCOUNTER — Encounter (INDEPENDENT_AMBULATORY_CARE_PROVIDER_SITE_OTHER): Payer: Self-pay | Admitting: Ophthalmology

## 2019-04-21 ENCOUNTER — Ambulatory Visit (HOSPITAL_COMMUNITY): Payer: Medicare Other

## 2019-04-26 ENCOUNTER — Ambulatory Visit: Payer: Medicare Other | Admitting: Internal Medicine

## 2019-06-07 ENCOUNTER — Other Ambulatory Visit: Payer: Self-pay | Admitting: Radiation Therapy

## 2019-06-13 ENCOUNTER — Ambulatory Visit
Admission: RE | Admit: 2019-06-13 | Discharge: 2019-06-13 | Disposition: A | Discharge status: Home / Self Care | Payer: Medicare Other | Source: Ambulatory Visit | Attending: Internal Medicine | Admitting: Internal Medicine

## 2019-06-13 DIAGNOSIS — D420 Neoplasm of uncertain behavior of cerebral meninges: Secondary | ICD-10-CM

## 2019-06-13 MED ORDER — GADOBENATE DIMEGLUMINE 529 MG/ML IV SOLN
20.0000 mL | Freq: Once | INTRAVENOUS | Status: AC | PRN
  Administered 2019-06-13: 20 mL via INTRAVENOUS

## 2019-06-15 ENCOUNTER — Inpatient Hospital Stay: Payer: Medicare Other | Attending: Internal Medicine

## 2019-06-15 DIAGNOSIS — D320 Benign neoplasm of cerebral meninges: Secondary | ICD-10-CM | POA: Insufficient documentation

## 2019-06-16 ENCOUNTER — Inpatient Hospital Stay (HOSPITAL_BASED_OUTPATIENT_CLINIC_OR_DEPARTMENT_OTHER): Payer: Medicare Other | Admitting: Internal Medicine

## 2019-06-16 ENCOUNTER — Other Ambulatory Visit: Payer: Self-pay

## 2019-06-16 ENCOUNTER — Telehealth: Payer: Self-pay | Admitting: Internal Medicine

## 2019-06-16 VITALS — BP 125/71 | HR 69 | Temp 99.1°F | Resp 17 | Ht 69.0 in | Wt 187.2 lb

## 2019-06-16 DIAGNOSIS — D420 Neoplasm of uncertain behavior of cerebral meninges: Secondary | ICD-10-CM

## 2019-06-16 DIAGNOSIS — D320 Benign neoplasm of cerebral meninges: Secondary | ICD-10-CM | POA: Diagnosis present

## 2019-06-16 NOTE — Progress Notes (Signed)
[facility] at [place], [place] [address] [phone]   Interval Evaluation  Date of Service: 06/16/19 Patient Name: James Olsen Patient MRN: [id number] Patient DOB: [birth year] Provider: [name], MD  Identifying Statement:  James Olsen is a 73 y.o. 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  He describes no new or progressive neurologic deficits.  Occasionally has some word finding difficutly.  Denies seizures or headaches.  H+P (01/20/19) Patient presented to medical attention in the winter of 2019 after noticing a "bulging" coming from the top of his head.  He also had experienced some numbness of his left leg at the time.  He obtained an MRI which demonstrated a dural based mass extending through the skull.  After a period of monitoring he underwent resection on 10/29/18 with Dr. Christella Noa.  Post-operatively he was very weak on the right side and was found to have a stroke.  After extensive rehabilitation he regained most of his right sided function.  Because of residual tumor and histology (WHO II) radiation was started on 12/19 with Dr. Isidore Moos.  Next week is his final session which he has tolerated without ill effect.  Medications: Current Outpatient Medications on File Prior to Visit  Medication Sig Dispense Refill  . Multiple Vitamins-Minerals (PRESERVISION AREDS 2) CAPS Take 1 capsule by mouth 2 (two) times daily.      No current facility-administered medications on file prior to visit.     Allergies: No Known Allergies Past Medical History:  Past Medical History:  Diagnosis Date  . Arthritis   . GERD (gastroesophageal  reflux disease)   . Glaucoma    pt denies.   . History of blood transfusion     5 units after previous surgery  . History of radiation therapy 12/09/18- 01/24/2019   Left sagittal brain, 11.8 Gy in 31 fractions for a total dose of 55.8 Gy  . Macular degeneration   . Stroke [facility])    after surgery in August 2019 mostly recovered still some weakness in left arm  . Wears glasses    Past Surgical History:  Past Surgical History:  Procedure Laterality Date  . CHOLECYSTECTOMY  2008   lap choli  . COLONOSCOPY    . CRANIOPLASTY N/A 10/29/2018   Procedure: CRANIOPLASTY;  Surgeon: [name], MD;  Location: [facility];  Service: Neurosurgery;  Laterality: N/A;  CRANIOPLASTY  . CRANIOTOMY N/A 08/18/2018   Procedure: CRANIOTOMY TUMOR EXCISION;  Surgeon: [name], MD;  Location: [facility];  Service: Neurosurgery;  Laterality: N/A;  CRANIOTOMY TUMOR EXCISION  . NAILBED REPAIR Left 05/04/2014   Procedure: LEFT INDEX NAIL ABLATION V-Y FLAP COVERAGE;  Surgeon: [name]., MD;  Location: [facility];  Service: Orthopedics;  Laterality: Left;  index  . PILONIDAL CYST EXCISION     x2  . TONSILLECTOMY    . VASECTOMY     Social History:  Social History   Socioeconomic History  . Marital status: Married    Spouse name: Not on file  . Number of children: Not on file  . Years of education: Not on file  . Highest education level: Not on file  Occupational History  .  Not on file  Social Needs  . Financial resource strain: Not on file  . Food insecurity    Worry: Not on file    Inability: Not on file  . Transportation needs    Medical: No    Non-medical: No  Tobacco Use  . Smoking status: Never Smoker  . Smokeless tobacco: Never Used  Substance and Sexual Activity  . Alcohol use: Yes    Comment: occasionally  . Drug use: No  . Sexual activity: Not on file  Lifestyle  . Physical activity    Days per week: Not on file    Minutes per session: Not on file  . Stress: Not on  file  Relationships  . Social Herbalist on phone: Not on file    Gets together: Not on file    Attends religious service: Not on file    Active member of club or organization: Not on file    Attends meetings of clubs or organizations: Not on file    Relationship status: Not on file  . Intimate partner violence    Fear of current or ex partner: No    Emotionally abused: No    Physically abused: No    Forced sexual activity: No  Other Topics Concern  . Not on file  Social History Narrative  . Not on file   Family History:  Family History  Problem Relation Age of Onset  . Diabetes Mother   . Skin cancer Brother   . Congenital heart disease Brother     Review of Systems: Constitutional: Denies fevers, chills or abnormal weight loss Eyes: Denies blurriness of vision Ears, nose, mouth, throat, and face: Denies mucositis or sore throat Respiratory: Denies cough, dyspnea or wheezes Cardiovascular: Denies palpitation, chest discomfort or lower extremity swelling Gastrointestinal:  Denies nausea, constipation, diarrhea GU: Denies dysuria or incontinence Skin: Denies abnormal skin rashes Neurological: Per HPI Musculoskeletal: Denies joint pain, back or neck discomfort. No decrease in ROM Behavioral/Psych: Denies anxiety, disturbance in thought content, and mood instability  Physical Exam: Vitals:   06/16/19 1101  BP: 125/71  Pulse: 69  Resp: 17  Temp: 99.1 F (37.3 C)  SpO2: 99%   KPS: 90. General: Alert, cooperative, pleasant, in no acute distress Head: Craniotomy scar noted, dry and intact. EENT: No conjunctival injection or scleral icterus. Oral mucosa moist Lungs: Resp effort normal Cardiac: Regular rate and rhythm Abdomen: Soft, non-distended abdomen Skin: No rashes cyanosis or petechiae. Extremities: No clubbing or edema  Neurologic Exam: Mental Status: Awake, alert, attentive to examiner. Oriented to self and environment. Language is fluent with  intact comprehension.  Cranial Nerves: Visual acuity is grossly normal. Visual fields are full. Extra-ocular movements intact. No ptosis. Face is symmetric, tongue midline. Motor: Tone and bulk are normal. Power is full in both arms and legs. Reflexes are symmetric, no pathologic reflexes present. Intact finger to nose bilaterally Sensory: Intact to light touch and temperature Gait: Normal and tandem gait is normal.   Labs: I have reviewed the data as listed    Component Value Date/Time   NA 141 08/30/2018 0524   K 3.7 08/30/2018 0524   CL 107 08/30/2018 0524   CO2 26 08/30/2018 0524   GLUCOSE 116 (H) 08/30/2018 0524   BUN 16 08/30/2018 0524   CREATININE 0.95 11/26/2018 1231   CALCIUM 8.5 (L) 08/30/2018 0524   PROT 5.5 (L) 08/25/2018 0928   ALBUMIN 2.9 (L) 08/25/2018 0928   AST 22 08/25/2018  0928   ALT 45 (H) 08/25/2018 0928   ALKPHOS 51 08/25/2018 0928   BILITOT 0.8 08/25/2018 0928   GFRNONAA >60 11/26/2018 1231   GFRAA >60 11/26/2018 1231   Lab Results  Component Value Date   WBC 6.3 10/22/2018   NEUTROABS 11.4 (H) 08/25/2018   HGB 13.5 10/22/2018   HCT 43.5 10/22/2018   MCV 93.1 10/22/2018   PLT 171 10/22/2018    Imaging:  [place] Clinician Interpretation: I have personally reviewed the CNS images as listed.  My interpretation, in the context of the patient's clinical presentation, is treatment effect vs true progression  Mr Jeri Cos Wo Contrast  Result Date: 06/13/2019 CLINICAL DATA:  Continued surveillance treated meningioma. EXAM: MRI HEAD WITHOUT AND WITH CONTRAST TECHNIQUE: Multiplanar, multiecho pulse sequences of the brain and surrounding structures were obtained without and with intravenous contrast. CONTRAST:  61mL MULTIHANCE GADOBENATE DIMEGLUMINE 529 MG/ML IV SOLN Creatinine was obtained on site at [facility] at [address] Results: Creatinine 0.8 mg/dL. COMPARISON:  Multiple priors, most recent 11/26/2018. FINDINGS: Brain: Redemonstrated is a  LEFT posterior frontal parasagittal meningioma, 21 x 40 x 18 mm, not significantly changed in size. There is a small tubular focus of enhancement, series 17, image 16, series 16, image 133, which was not present previously and is of uncertain significance. Of note, moderate surrounding vasogenic edema is increased, and spreads anteriorly throughout the LEFT centrum semiovale and subcortical U fibers. Superior sagittal sinus ingrowth appears stable. Mild atrophy. Mild to moderate chronic microvascular ischemic change. No restricted diffusion. Vascular: Chronic blood products at the postsurgical post treatment site. Skull and upper cervical spine: Unremarkable craniotomy. Sinuses/Orbits: No sinus disease.  Negative orbits. Other: None. IMPRESSION: LEFT posterior frontal parasagittal meningioma is not significantly changed in size, but there is moderate surrounding vasogenic edema which is increased from priors. There is a small tubular focus of enhancement, lateral to the lesion, of uncertain significance. Continued short-term surveillance is warranted. Electronically Signed   By: Staci Righter M.D.   On: 06/13/2019 16:48    Assessment/Plan 1. 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[facility])   James Olsen is clinically stable.  His brain MRI demonstrates overall stability in volume of enhancement, but small enhancing appendix is noted which is new.  There is increased T2 signal and surrounding edema compared to prior scan as well.  These findings may be related to treatment effect.  Because of these changes we ask that he return to clinic in 4 months with next MRI brain for evaluation.  No further intervention at this time.  All questions were answered. The patient knows to call the clinic with any problems, questions or concerns. No barriers to learning were detected.  The total time spent in the encounter was 25 minutes and more than 50% was on counseling and review of test results   [name], MD  Medical Director of Neuro-Oncology [facility] [place] 06/16/19 10:59 AM

## 2019-06-16 NOTE — Telephone Encounter (Signed)
Gave avs and calendar ° °

## 2019-06-20 ENCOUNTER — Other Ambulatory Visit: Payer: Self-pay

## 2019-06-20 ENCOUNTER — Encounter (INDEPENDENT_AMBULATORY_CARE_PROVIDER_SITE_OTHER): Payer: Medicare Other | Admitting: Ophthalmology

## 2019-06-20 DIAGNOSIS — H2513 Age-related nuclear cataract, bilateral: Secondary | ICD-10-CM

## 2019-06-20 DIAGNOSIS — H43813 Vitreous degeneration, bilateral: Secondary | ICD-10-CM

## 2019-06-20 DIAGNOSIS — H353132 Nonexudative age-related macular degeneration, bilateral, intermediate dry stage: Secondary | ICD-10-CM

## 2019-09-13 ENCOUNTER — Other Ambulatory Visit: Payer: Self-pay | Admitting: Radiation Therapy

## 2019-10-10 ENCOUNTER — Encounter: Payer: Medicare Other | Admitting: Occupational Therapy

## 2019-10-11 ENCOUNTER — Other Ambulatory Visit: Payer: Self-pay

## 2019-10-11 ENCOUNTER — Ambulatory Visit
Admission: RE | Admit: 2019-10-11 | Discharge: 2019-10-11 | Disposition: A | Discharge status: Home / Self Care | Payer: Medicare Other | Source: Ambulatory Visit | Attending: Internal Medicine | Admitting: Internal Medicine

## 2019-10-11 DIAGNOSIS — D420 Neoplasm of uncertain behavior of cerebral meninges: Secondary | ICD-10-CM

## 2019-10-11 MED ORDER — GADOBENATE DIMEGLUMINE 529 MG/ML IV SOLN
17.0000 mL | Freq: Once | INTRAVENOUS | Status: AC | PRN
  Administered 2019-10-11: 17 mL via INTRAVENOUS

## 2019-10-13 ENCOUNTER — Encounter: Payer: Medicare Other | Admitting: Occupational Therapy

## 2019-10-14 ENCOUNTER — Other Ambulatory Visit: Payer: Medicare Other

## 2019-10-17 ENCOUNTER — Telehealth: Payer: Self-pay | Admitting: Internal Medicine

## 2019-10-17 ENCOUNTER — Other Ambulatory Visit: Payer: Self-pay

## 2019-10-17 ENCOUNTER — Encounter: Payer: Medicare Other | Admitting: Occupational Therapy

## 2019-10-17 ENCOUNTER — Inpatient Hospital Stay: Payer: Medicare Other | Attending: Internal Medicine | Admitting: Internal Medicine

## 2019-10-17 VITALS — BP 128/79 | HR 65 | Temp 98.2°F | Resp 17 | Ht 69.0 in | Wt 184.6 lb

## 2019-10-17 DIAGNOSIS — D320 Benign neoplasm of cerebral meninges: Secondary | ICD-10-CM | POA: Insufficient documentation

## 2019-10-17 DIAGNOSIS — D420 Neoplasm of uncertain behavior of cerebral meninges: Secondary | ICD-10-CM

## 2019-10-17 NOTE — Progress Notes (Signed)
[facility] at [place], [place] [address] [phone]   Interval Evaluation  Date of Service: 10/17/19 Patient Name: James Olsen Patient MRN: [id number] Patient DOB: 1946/12/14 Provider: [name], MD  Identifying Statement:  HADRIEN MILEWSKI is a 73 y.o. 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  He describes no new or progressive neurologic deficits.  Still occasionally has some word finding difficutly.  Denies seizures or headaches.  H+P (01/20/19) Patient presented to medical attention in the winter of 2019 after noticing a "bulging" coming from the top of his head.  He also had experienced some numbness of his left leg at the time.  He obtained an MRI which demonstrated a dural based mass extending through the skull.  After a period of monitoring he underwent resection on 10/29/18 with Dr. Christella Noa.  Post-operatively he was very weak on the right side and was found to have a stroke.  After extensive rehabilitation he regained most of his right sided function.  Because of residual tumor and histology (WHO II) radiation was started on 12/19 with Dr. Isidore Moos.  Next week is his final session which he has tolerated without ill effect.  Medications: Current Outpatient Medications on File Prior to Visit  Medication Sig Dispense Refill   Multiple Vitamins-Minerals (PRESERVISION AREDS 2) CAPS Take 1 capsule by mouth 2 (two) times daily.      Red Yeast Rice Extract 600 MG CAPS Take 2 tablets by mouth daily.     No current facility-administered medications on file prior to visit.     Allergies: No Known Allergies Past  Medical History:  Past Medical History:  Diagnosis Date   Arthritis    GERD (gastroesophageal reflux disease)    Glaucoma    pt denies.    History of blood transfusion     5 units after previous surgery   History of radiation therapy 12/09/18- 01/24/2019   Left sagittal brain, 11.8 Gy in 31 fractions for a total dose of 55.8 Gy   Macular degeneration    Stroke [facility])    after surgery in August 2019 mostly recovered still some weakness in left arm   Wears glasses    Past Surgical History:  Past Surgical History:  Procedure Laterality Date   CHOLECYSTECTOMY  2008   lap choli   COLONOSCOPY     CRANIOPLASTY N/A 10/29/2018   Procedure: CRANIOPLASTY;  Surgeon: [name], MD;  Location: [facility];  Service: Neurosurgery;  Laterality: N/A;  CRANIOPLASTY   CRANIOTOMY N/A 08/18/2018   Procedure: CRANIOTOMY TUMOR EXCISION;  Surgeon: [name], MD;  Location: [facility];  Service: Neurosurgery;  Laterality: N/A;  CRANIOTOMY TUMOR EXCISION   NAILBED REPAIR Left 05/04/2014   Procedure: LEFT INDEX NAIL ABLATION V-Y FLAP COVERAGE;  Surgeon: [name]., MD;  Location: [facility];  Service: Orthopedics;  Laterality: Left;  index   PILONIDAL CYST EXCISION     x2   TONSILLECTOMY     VASECTOMY     Social History:  Social History   Socioeconomic History   Marital status: Married    Spouse name: Not on file   Number of children:  Not on file   Years of education: Not on file   Highest education level: Not on file  Occupational History   Not on file  Social Needs   Financial resource strain: Not on file   Food insecurity    Worry: Not on file    Inability: Not on file   Transportation needs    Medical: No    Non-medical: No  Tobacco Use   Smoking status: Never Smoker   Smokeless tobacco: Never Used  Substance and Sexual Activity   Alcohol use: Yes    Comment: occasionally   Drug use: No   Sexual activity: Not on file  Lifestyle    Physical activity    Days per week: Not on file    Minutes per session: Not on file   Stress: Not on file  Relationships   Social connections    Talks on phone: Not on file    Gets together: Not on file    Attends religious service: Not on file    Active member of club or organization: Not on file    Attends meetings of clubs or organizations: Not on file    Relationship status: Not on file   Intimate partner violence    Fear of current or ex partner: No    Emotionally abused: No    Physically abused: No    Forced sexual activity: No  Other Topics Concern   Not on file  Social History Narrative   Not on file   Family History:  Family History  Problem Relation Age of Onset   Diabetes Mother    Skin cancer Brother    Congenital heart disease Brother     Review of Systems: Constitutional: Denies fevers, chills or abnormal weight loss Eyes: Denies blurriness of vision Ears, nose, mouth, throat, and face: Denies mucositis or sore throat Respiratory: Denies cough, dyspnea or wheezes Cardiovascular: Denies palpitation, chest discomfort or lower extremity swelling Gastrointestinal:  Denies nausea, constipation, diarrhea GU: Denies dysuria or incontinence Skin: Denies abnormal skin rashes Neurological: Per HPI Musculoskeletal: Denies joint pain, back or neck discomfort. No decrease in ROM Behavioral/Psych: Denies anxiety, disturbance in thought content, and mood instability  Physical Exam: Vitals:   10/17/19 1034  BP: 128/79  Pulse: 65  Resp: 17  Temp: 98.2 F (36.8 C)  SpO2: 100%   KPS: 90. General: Alert, cooperative, pleasant, in no acute distress Head: Craniotomy scar noted, dry and intact. EENT: No conjunctival injection or scleral icterus. Oral mucosa moist Lungs: Resp effort normal Cardiac: Regular rate and rhythm Abdomen: Soft, non-distended abdomen Skin: No rashes cyanosis or petechiae. Extremities: No clubbing or edema  Neurologic Exam: Mental  Status: Awake, alert, attentive to examiner. Oriented to self and environment. Language is fluent with intact comprehension.  Cranial Nerves: Visual acuity is grossly normal. Visual fields are full. Extra-ocular movements intact. No ptosis. Face is symmetric, tongue midline. Motor: Tone and bulk are normal. Power is full in both arms and legs. Reflexes are symmetric, no pathologic reflexes present. Intact finger to nose bilaterally Sensory: Intact to light touch and temperature Gait: Normal and tandem gait is normal.   Labs: I have reviewed the data as listed    Component Value Date/Time   NA 141 08/30/2018 0524   K 3.7 08/30/2018 0524   CL 107 08/30/2018 0524   CO2 26 08/30/2018 0524   GLUCOSE 116 (H) 08/30/2018 0524   BUN 16 08/30/2018 0524   CREATININE 0.95 11/26/2018 1231  CALCIUM 8.5 (L) 08/30/2018 0524   PROT 5.5 (L) 08/25/2018 0928   ALBUMIN 2.9 (L) 08/25/2018 0928   AST 22 08/25/2018 0928   ALT 45 (H) 08/25/2018 0928   ALKPHOS 51 08/25/2018 0928   BILITOT 0.8 08/25/2018 0928   GFRNONAA >60 11/26/2018 1231   GFRAA >60 11/26/2018 1231   Lab Results  Component Value Date   WBC 6.3 10/22/2018   NEUTROABS 11.4 (H) 08/25/2018   HGB 13.5 10/22/2018   HCT 43.5 10/22/2018   MCV 93.1 10/22/2018   PLT 171 10/22/2018    Imaging:  [facility] Clinician Interpretation: I have personally reviewed the CNS images as listed.  My interpretation, in the context of the patient's clinical presentation, is stable disease  Mr Jeri Cos Wo Contrast  Result Date: 10/11/2019 CLINICAL DATA:  Atypical meningioma.  Follow-up Creatinine was obtained on site at [facility] at [address] Results: Creatinine 0.8 mg/dL. EXAM: MRI HEAD WITHOUT AND WITH CONTRAST TECHNIQUE: Multiplanar, multiecho pulse sequences of the brain and surrounding structures were obtained without and with intravenous contrast. CONTRAST:  55mL MULTIHANCE GADOBENATE DIMEGLUMINE 529 MG/ML IV SOLN COMPARISON:  MRI head  06/13/2019 FINDINGS: Brain: Convexity craniotomy for meningioma resection. Residual enhancing tumor is stable. Tumor fills the superior sagittal sinus, unchanged. Tumor extends to the left of the falx, unchanged in volume. Tumor measures approximately 2.8 x 1.3 x 4.4 cm. Small focus of enhancing tumor extending lateral on the left is unchanged from the prior study. Encephalomalacia in the left frontal convexity adjacent to the surgical resection is unchanged. Edema in the white matter of the left frontal convexity is unchanged. Ventricle size normal. Negative for acute infarct. No hemorrhage or fluid collection. No other enhancing mass lesion identified Vascular: Normal arterial flow voids Skull and upper cervical spine: Convexity craniotomy. Craniotomy flap in good position. Sinuses/Orbits: Mucosal edema paranasal sinuses.  Normal orbit Other: None IMPRESSION: Parasagittal convexity meningioma stable in size from the prior MRI. Tumor fills the superior sagittal sinus. Vasogenic edema in the left frontal white matter over the convexity is stable. Electronically Signed   By: Franchot Gallo M.D.   On: 10/11/2019 14:17    Assessment/Plan 1. 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[facility])   [name] is clinically and radiographically stable.   He should return to clinic in 6 months with next MRI brain for evaluation.  No further intervention at this time.  All questions were answered. The patient knows to call the clinic with any problems, questions or concerns. No barriers to learning were detected.  The total time spent in the encounter was 25 minutes and more than 50% was on counseling and review of test results   [name], MD Medical Director of Neuro-Oncology [facility] [place] 10/17/19 10:15 AM

## 2019-10-17 NOTE — Telephone Encounter (Signed)
Gave patient avs report and appointments for April. [facility] will contact patient re mri due April 2021. Patient also has information to follow up with them.

## 2019-10-19 ENCOUNTER — Inpatient Hospital Stay: Payer: Medicare Other

## 2019-10-20 ENCOUNTER — Encounter: Payer: Medicare Other | Admitting: Occupational Therapy

## 2019-10-24 ENCOUNTER — Encounter: Payer: Medicare Other | Admitting: Occupational Therapy

## 2019-10-27 ENCOUNTER — Encounter: Payer: Medicare Other | Admitting: Occupational Therapy

## 2019-10-31 ENCOUNTER — Encounter: Payer: Medicare Other | Admitting: Occupational Therapy

## 2019-11-03 ENCOUNTER — Encounter: Payer: Medicare Other | Admitting: Occupational Therapy

## 2019-11-08 IMAGING — MR MR HEAD WO/W CM
13 series · 48 of 48 positions shown · IV contrast (multihance)
Comparison: MRI head 06/13/2019

CLINICAL DATA: Atypical meningioma.  Follow-up

Creatinine was obtained on site at [HOSPITAL] at [HOSPITAL].
Results: Creatinine 0.8 mg/dL.
EXAM:
MRI HEAD WITHOUT AND WITH CONTRAST
TECHNIQUE: Multiplanar, multiecho pulse sequences of the brain and surrounding
structures were obtained without and with intravenous contrast.
CONTRAST:  17mL MULTIHANCE GADOBENATE DIMEGLUMINE 529 MG/ML IV SOLN

[Series 2: T1 · sagittal · 5.0mm · 0.45mm/px · 1 of 23 slices shown]
[im 1/23]
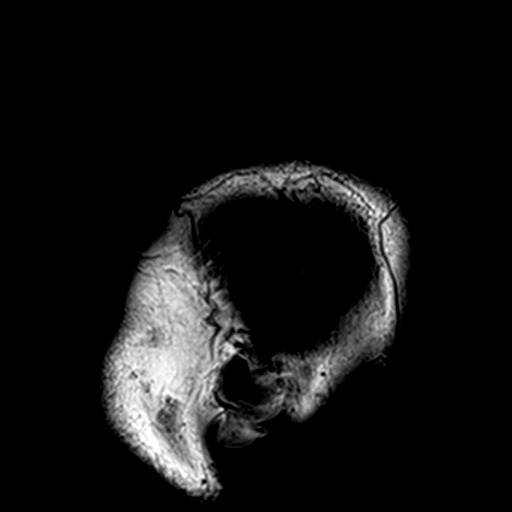

[Series 3: DWI · axial · 3.0mm · 1.80mm/px · z∈[-36,+114]mm · 7 of 103 slices shown (1 of 4)]
[im 1/103]
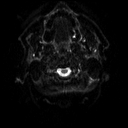
[im 18/103]
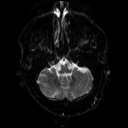
[im 35/103]
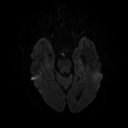
[im 52/103]
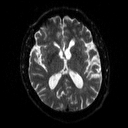
[im 69/103]
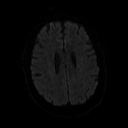
[im 86/103]
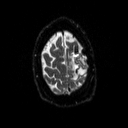
[im 103/103]
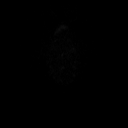

[Series 4: DWI · axial · 3.0mm · 1.80mm/px · z∈[-36,+114]mm · 3 of 52 slices shown (2 of 4)]
[im 1/52]
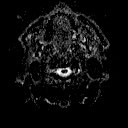
[im 26/52]
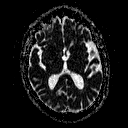
[im 52/52]
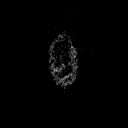

[Series 5: DWI · coronal · 5.0mm · 1.80mm/px · 5 of 72 slices shown (3 of 4)]
[im 1/72]
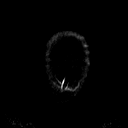
[im 18/72]
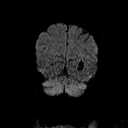
[im 36/72]
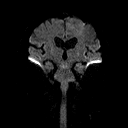
[im 54/72]
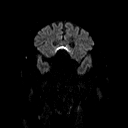
[im 72/72]
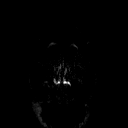

[Series 6: DWI · coronal · 5.0mm · 1.80mm/px · 2 of 36 slices shown (4 of 4)]
[im 1/36]
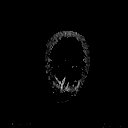
[im 36/36]
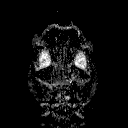

[Series 7: T2 · axial · 5.0mm · 0.51mm/px · z∈[-28,+123]mm · 2 of 24 slices shown (1 of 2)]
[im 1/24]
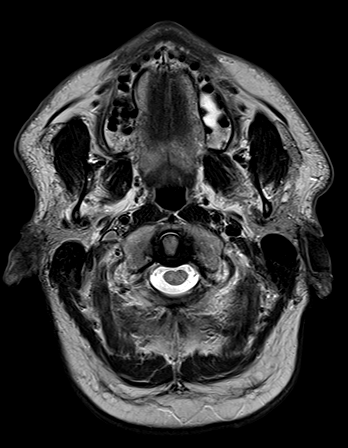
[im 24/24]
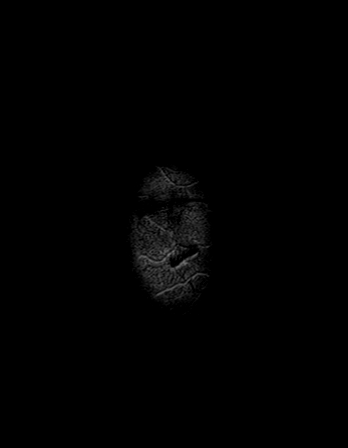

[Series 8: FLAIR · axial · 3.0mm · 0.45mm/px · z∈[-31,+119]mm · 2 of 34 slices shown]
[im 1/34]
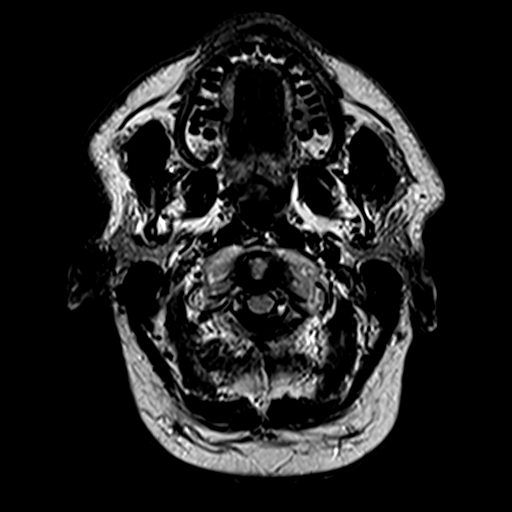
[im 34/34]
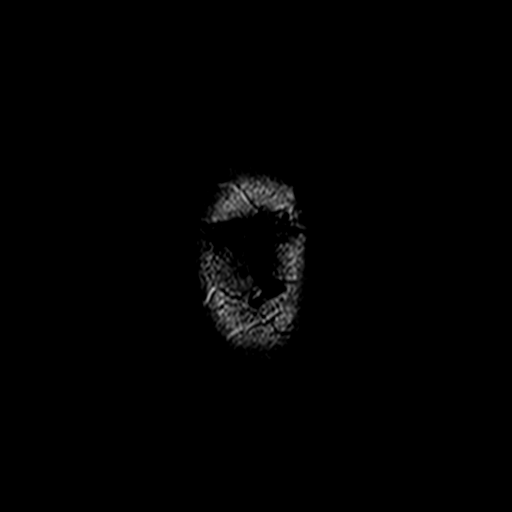

[Series 10: swi_images · axial · 4.0mm · 0.90mm/px · z∈[-34,+119]mm · 3 of 40 slices shown]
[im 1/40]
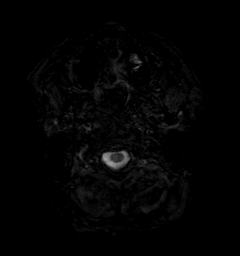
[im 20/40]
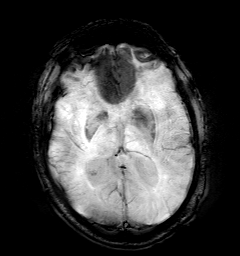
[im 40/40]
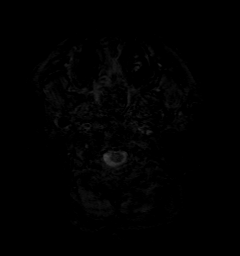

[Series 11: t1_mpr_tra · axial · 1.0mm · 0.75mm/px · z∈[-29,+116]mm · 9 of 144 slices shown (1 of 2)]
[im 1/144]
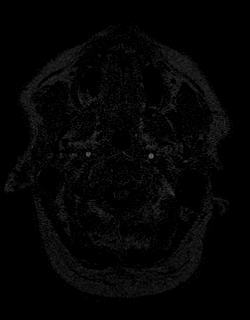
[im 18/144]
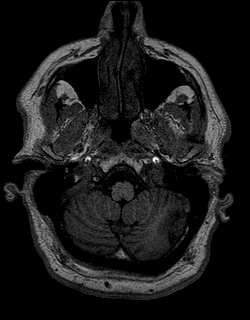
[im 36/144]
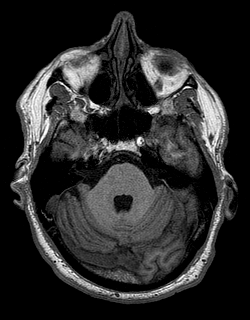
[im 54/144]
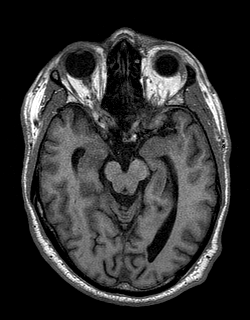
[im 72/144]
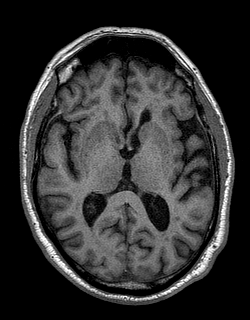
[im 90/144]
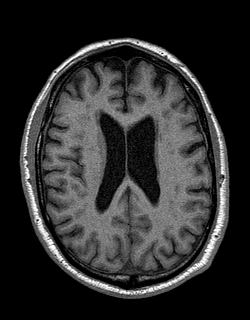
[im 108/144]
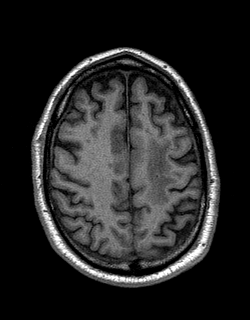
[im 126/144]
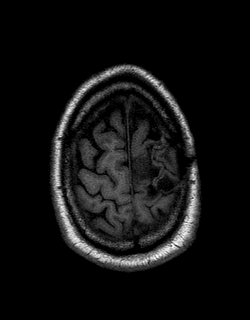
[im 144/144]
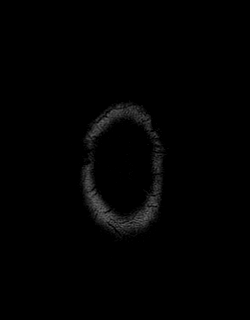

[Series 12: T2 · coronal · 5.0mm · 0.45mm/px · 2 of 28 slices shown (2 of 2)]
[im 1/28]
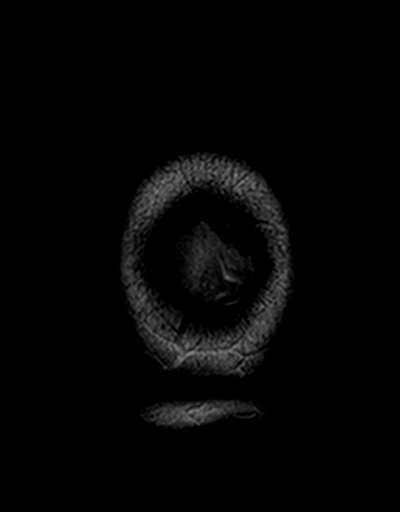
[im 28/28]
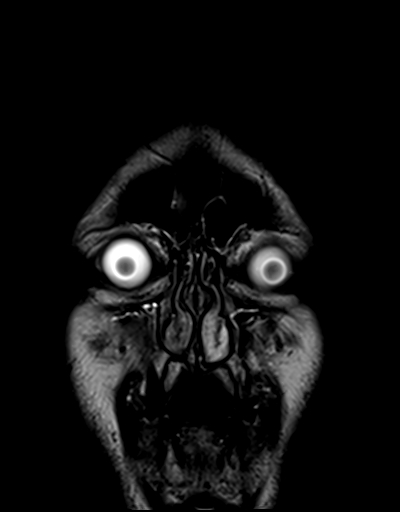

[Series 13: t1_mpr_tra · axial · 1.0mm · 0.75mm/px · z∈[-29,+116]mm · 9 of 144 slices shown (2 of 2)]
[im 1/144]
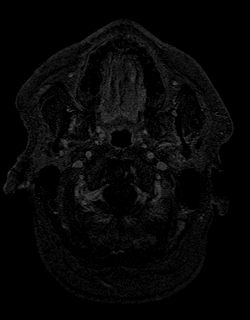
[im 18/144]
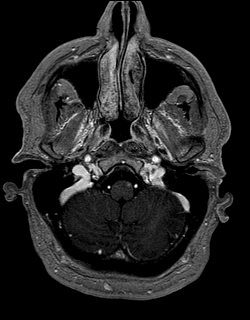
[im 36/144]
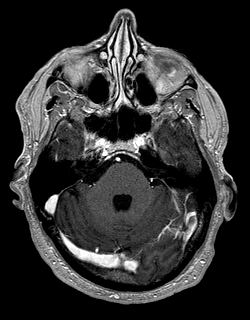
[im 54/144]
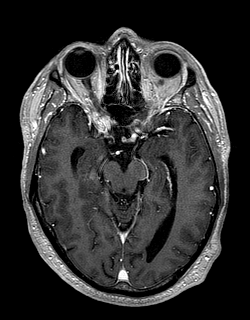
[im 72/144]
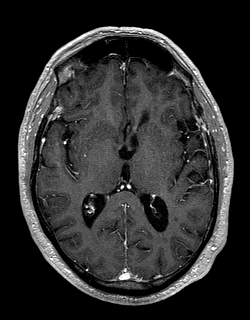
[im 90/144]
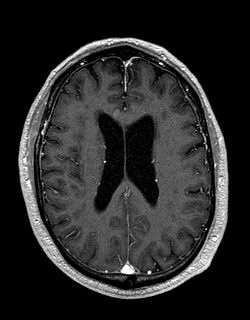
[im 108/144]
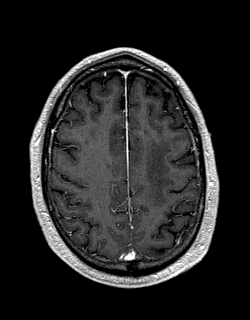
[im 126/144]
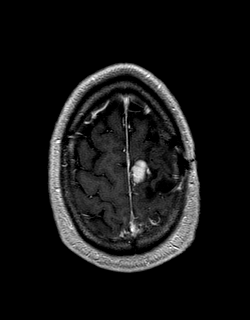
[im 144/144]
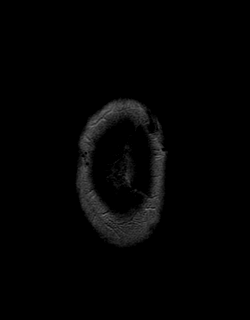

[Series 14: post cor · coronal · 5.0mm · 0.45mm/px · 2 of 28 slices shown]
[im 1/28]
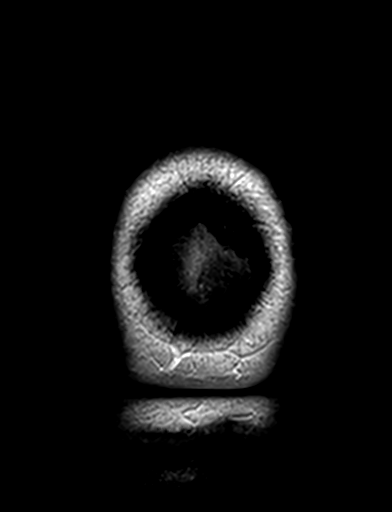
[im 28/28]
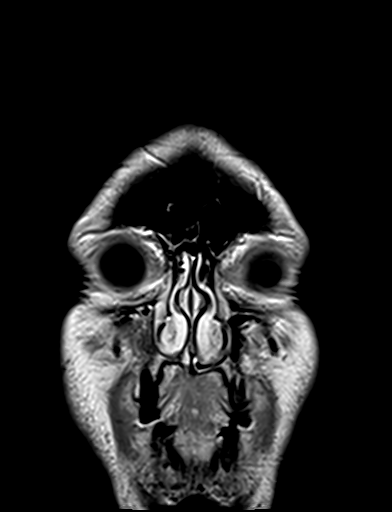

[Series 15: post sag (optional · sagittal · 5.0mm · 0.45mm/px · 1 of 23 slices shown]
[im 1/23]
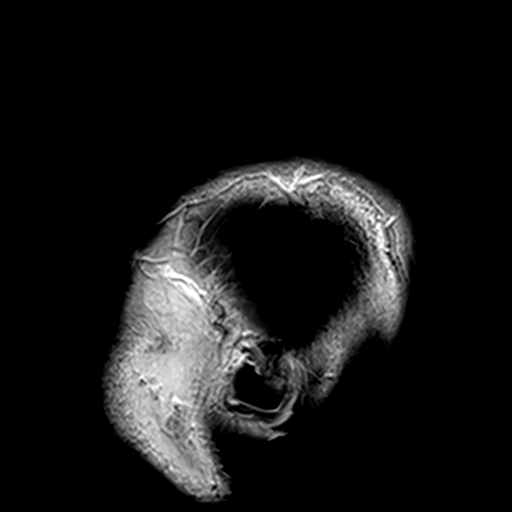

[48 of 48 positions shown; findings below may reference images not displayed]

FINDINGS: Brain: Convexity craniotomy for meningioma resection. Residual
enhancing tumor is stable. Tumor fills the superior sagittal sinus,
unchanged. Tumor extends to the left of the falx, unchanged in
volume. Tumor measures approximately 2.8 x 1.3 x 4.4 cm. Small focus
of enhancing tumor extending lateral on the left is unchanged from
the prior study. Encephalomalacia in the left frontal convexity
adjacent to the surgical resection is unchanged. Edema in the white
matter of the left frontal convexity is unchanged.

Ventricle size normal. Negative for acute infarct. No hemorrhage or
fluid collection. No other enhancing mass lesion identified

Vascular: Normal arterial flow voids

Skull and upper cervical spine: Convexity craniotomy. Craniotomy
flap in good position.

Sinuses/Orbits: Mucosal edema paranasal sinuses.  Normal orbit

Other: None
IMPRESSION: Parasagittal convexity meningioma stable in size from the prior MRI.
Tumor fills the superior sagittal sinus. Vasogenic edema in the left
frontal white matter over the convexity is stable.

## 2019-11-19 ENCOUNTER — Other Ambulatory Visit: Payer: Self-pay

## 2019-11-19 ENCOUNTER — Ambulatory Visit (HOSPITAL_COMMUNITY)
Admission: EM | Admit: 2019-11-19 | Discharge: 2019-11-19 | Disposition: A | Discharge status: Home / Self Care | Payer: Medicare Other

## 2019-11-19 ENCOUNTER — Encounter (HOSPITAL_COMMUNITY): Payer: Self-pay

## 2019-11-19 DIAGNOSIS — H1131 Conjunctival hemorrhage, right eye: Secondary | ICD-10-CM | POA: Diagnosis not present

## 2019-11-19 NOTE — Discharge Instructions (Addendum)
Follow up with ophthalmology if sensation in R eye persists.

## 2019-11-19 NOTE — ED Triage Notes (Signed)
Pt sates he is having the sensation of having and eyelashe in his right eye x 3 days. Pt reports his right eye has being red x 3 days. Pt denies any pain or discharge.

## 2019-11-19 NOTE — ED Provider Notes (Signed)
[place]    CSN: [id number] Arrival date & time: 11/19/19  1229      History   Chief Complaint Chief Complaint  Patient presents with  . Appointment    12:30  . Eye Problem    HPI James Olsen is a 73 y.o. male.   Patient here concerned with R "red eye" x 2 - 3 days.  States his wife noted it was red when they were eating.  He notes "foreign body" sensation of eye.  Denies eye pain, discharge, swelling, tenderness, photophobia, vision changes, vision loss.  No injury, fall, or trauma.  Denies history of HTN, he is not on blood thinners.     Past Medical History:  Diagnosis Date  . Arthritis   . GERD (gastroesophageal reflux disease)   . Glaucoma    pt denies.   . History of blood transfusion     5 units after previous surgery  . History of radiation therapy 12/09/18- 01/24/2019   Left sagittal brain, 11.8 Gy in 31 fractions for a total dose of 55.8 Gy  . Macular degeneration   . Stroke [facility])    after surgery in August 2019 mostly recovered still some weakness in left arm  . Wears glasses     Patient Active Problem List   Diagnosis Date Noted  . 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 Meningioma ([place]) 10/29/2018  . Other abnormalities of gait and mobility 09/17/2018  . Acute cerebral infarction ([place])   . Thrombocytopenia ([place])   . 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 Acute blood loss anemia   . Generalized OA   . Gastroesophageal reflux disease   . Seizure prophylaxis   . Leukocytosis   . Tumor 08/18/2018  . Meningioma determined by biopsy of brain ([place]) 08/18/2018  . MACULAR DEGENERATION 02/29/2008  . ALLERGY 02/29/2008  . Nonspecific (abnormal) findings on radiological and other examination of body structure 12/26/2007  . NONSPECIFIC ABNORM FIND RAD&OTH EXAM LUNG FIELD 12/26/2007  . GALLSTONES 10/01/2007  . ELEVATION, TRANSAMINASE/LDH LEVELS 09/06/2007  . HEPATITIS NOS 09/01/2007   . ABDOMINAL PAIN, EPIGASTRIC 09/01/2007    Past Surgical History:  Procedure Laterality Date  . CHOLECYSTECTOMY  2008   lap choli  . COLONOSCOPY    . CRANIOPLASTY N/A 10/29/2018   Procedure: CRANIOPLASTY;  Surgeon: [name], MD;  Location: [place];  Service: Neurosurgery;  Laterality: N/A;  CRANIOPLASTY  . CRANIOTOMY N/A 08/18/2018   Procedure: CRANIOTOMY TUMOR EXCISION;  Surgeon: [name], MD;  Location: [facility];  Service: Neurosurgery;  Laterality: N/A;  CRANIOTOMY TUMOR EXCISION  . NAILBED REPAIR Left 05/04/2014   Procedure: LEFT INDEX NAIL ABLATION V-Y FLAP COVERAGE;  Surgeon: [name]., MD;  Location: [facility];  Service: Orthopedics;  Laterality: Left;  index  . PILONIDAL CYST EXCISION     x2  . TONSILLECTOMY    . VASECTOMY         Home Medications    Prior to Admission medications   Medication Sig Start Date End Date Taking? Authorizing Provider  Multiple Vitamins-Minerals (PRESERVISION AREDS 2) CAPS Take 1 capsule by mouth 2 (two) times daily.     [provider]  Red Yeast Rice Extract 600 MG CAPS Take 2 tablets by mouth daily. 01/22/19   [provider]    Family History Family History  Problem Relation Age of Onset  . Diabetes Mother   .  Skin cancer Brother   . Congenital heart disease Brother     Social History Social History   Tobacco Use  . Smoking status: Never Smoker  . Smokeless tobacco: Never Used  Substance Use Topics  . Alcohol use: Yes    Comment: occasionally  . Drug use: No     Allergies   Patient has no known allergies.   Review of Systems Review of Systems  Constitutional: Negative for chills, fatigue and fever.  HENT: Negative for ear discharge, ear pain, nosebleeds, rhinorrhea, sinus pressure, sinus pain, sneezing and sore throat.   Eyes: Positive for redness. Negative for photophobia, pain, discharge, itching and visual disturbance.  Respiratory: Negative for cough, shortness of  breath and wheezing.   Gastrointestinal: Negative for diarrhea, nausea and vomiting.  Musculoskeletal: Negative for arthralgias and myalgias.  Skin: Negative for rash.  Neurological: Negative for dizziness, syncope, light-headedness and headaches.  Hematological: Negative for adenopathy. Does not bruise/bleed easily.  Psychiatric/Behavioral: Negative for confusion and sleep disturbance.     Physical Exam Triage Vital Signs ED Triage Vitals  Enc Vitals Group     BP 11/19/19 1247 130/77     Pulse Rate 11/19/19 1247 83     Resp 11/19/19 1247 14     Temp 11/19/19 1247 97.8 F (36.6 C)     Temp Source 11/19/19 1247 Oral     SpO2 11/19/19 1247 98 %     Weight --      Height --      Head Circumference --      Peak Flow --      Pain Score 11/19/19 1246 0     Pain Loc --      Pain Edu? --      Excl. in [place]? --    No data found.  Updated Vital Signs BP 130/77 (BP Location: Left Arm)   Pulse 83   Temp 97.8 F (36.6 C) (Oral)   Resp 14   SpO2 98%   Visual Acuity Right Eye Distance: 20/40 Left Eye Distance: 20/40 Bilateral Distance: 20/30  Right Eye Near:   Left Eye Near:    Bilateral Near:     Physical Exam Vitals signs and nursing note reviewed.  Constitutional:      Appearance: Normal appearance. He is well-developed.  HENT:     Head: Normocephalic and atraumatic.     Nose: Nose normal.  Eyes:     General: Lids are normal. Lids are everted, no foreign bodies appreciated. Vision grossly intact. No allergic shiner, visual field deficit or scleral icterus.       Right eye: No foreign body or discharge.     Extraocular Movements: Extraocular movements intact.     Right eye: Normal extraocular motion.     Left eye: Normal extraocular motion.     Conjunctiva/sclera:     Right eye: Right conjunctiva is not injected. Hemorrhage present. No exudate.    Left eye: Left conjunctiva is not injected.     Pupils: Pupils are equal, round, and reactive to light.     Right eye:  No corneal abrasion or fluorescein uptake.     Slit lamp exam:    Right eye: No foreign body or photophobia.   Neck:     Musculoskeletal: Neck supple.  Cardiovascular:     Rate and Rhythm: Normal rate and regular rhythm.     Heart sounds: No murmur.  Pulmonary:     Effort: Pulmonary effort is normal. No respiratory distress.  Breath sounds: Normal breath sounds.  Musculoskeletal: Normal range of motion.  Skin:    General: Skin is warm and dry.     Capillary Refill: Capillary refill takes less than 2 seconds.  Neurological:     General: No focal deficit present.     Mental Status: He is alert and oriented to person, place, and time.  Psychiatric:        Mood and Affect: Mood normal.        Behavior: Behavior normal.      UC Treatments / Results  Labs (all labs ordered are listed, but only abnormal results are displayed) Labs Reviewed - No data to display  EKG   Radiology No results found.  Procedures Procedures (including critical care time)  Medications Ordered in UC Medications - No data to display  Initial Impression / Assessment and Plan / UC Course  I have reviewed the triage vital signs and the nursing notes.  Pertinent labs & imaging results that were available during my care of the patient were reviewed by me and considered in my medical decision making (see chart for details).      Final Clinical Impressions(s) / UC Diagnoses   Final diagnoses:  Subconjunctival hemorrhage of right eye     Discharge Instructions     Follow up with ophthalmology if sensation in R eye persists.    ED Prescriptions    None     PDMP not reviewed this encounter.   Peri Jefferson, PA-C 11/19/19 1319

## 2020-01-30 ENCOUNTER — Ambulatory Visit: Payer: Medicare Other | Attending: Internal Medicine

## 2020-01-30 DIAGNOSIS — Z23 Encounter for immunization: Secondary | ICD-10-CM | POA: Insufficient documentation

## 2020-01-30 NOTE — Progress Notes (Signed)
   Covid-19 Vaccination Clinic  Name:  James Olsen    MRN: [id number] DOB: 1946/03/02  01/30/2020  Mr. Lines was observed post Covid-19 immunization for 15 minutes without incidence. He was provided with Vaccine Information Sheet and instruction to access the V-Safe system.   [name] was instructed to call 911 with any severe reactions post vaccine: Marland Kitchen Difficulty breathing  . Swelling of your face and throat  . A fast heartbeat  . A bad rash all over your body  . Dizziness and weakness    Immunizations Administered    Name Date Dose VIS Date Route   Pfizer COVID-19 Vaccine 01/30/2020 11:10 AM 0.3 mL 12/02/2019 Intramuscular   Manufacturer: [employer]   Lot: [id number]   [place]: [id number]

## 2020-02-18 ENCOUNTER — Ambulatory Visit: Payer: Medicare Other

## 2020-02-24 ENCOUNTER — Ambulatory Visit: Payer: Medicare Other | Attending: Internal Medicine

## 2020-02-24 DIAGNOSIS — Z23 Encounter for immunization: Secondary | ICD-10-CM | POA: Insufficient documentation

## 2020-02-24 NOTE — Progress Notes (Signed)
   Covid-19 Vaccination Clinic  Name:  James Olsen    MRN: [id number] DOB: 06-20-1946  02/24/2020  [name] was observed post Covid-19 immunization for 15 minutes without incident. He was provided with Vaccine Information Sheet and instruction to access the V-Safe system.   [name] was instructed to call 911 with any severe reactions post vaccine: Marland Kitchen Difficulty breathing  . Swelling of face and throat  . A fast heartbeat  . A bad rash all over body  . Dizziness and weakness   Immunizations Administered    Name Date Dose VIS Date Route   Pfizer COVID-19 Vaccine 02/24/2020 11:09 AM 0.3 mL 12/02/2019 Intramuscular   Manufacturer: [place]   Lot: [id number]   [place]: [id number]

## 2020-02-28 ENCOUNTER — Other Ambulatory Visit: Payer: Self-pay | Admitting: Radiation Therapy

## 2020-04-13 ENCOUNTER — Other Ambulatory Visit: Payer: Self-pay

## 2020-04-13 ENCOUNTER — Ambulatory Visit
Admission: RE | Admit: 2020-04-13 | Discharge: 2020-04-13 | Disposition: A | Discharge status: Home / Self Care | Payer: Medicare Other | Source: Ambulatory Visit | Attending: Internal Medicine | Admitting: Internal Medicine

## 2020-04-13 DIAGNOSIS — D420 Neoplasm of uncertain behavior of cerebral meninges: Secondary | ICD-10-CM

## 2020-04-13 MED ORDER — GADOBENATE DIMEGLUMINE 529 MG/ML IV SOLN
17.0000 mL | Freq: Once | INTRAVENOUS | Status: AC | PRN
  Administered 2020-04-13: 17 mL via INTRAVENOUS

## 2020-04-16 ENCOUNTER — Inpatient Hospital Stay: Payer: Medicare Other | Attending: Internal Medicine | Admitting: Internal Medicine

## 2020-04-16 ENCOUNTER — Telehealth: Payer: Self-pay | Admitting: Internal Medicine

## 2020-04-16 ENCOUNTER — Other Ambulatory Visit: Payer: Self-pay

## 2020-04-16 VITALS — BP 126/69 | HR 64 | Temp 98.9°F | Resp 18 | Ht 69.0 in | Wt 186.2 lb

## 2020-04-16 DIAGNOSIS — D420 Neoplasm of uncertain behavior of cerebral meninges: Secondary | ICD-10-CM

## 2020-04-16 DIAGNOSIS — C713 Malignant neoplasm of parietal lobe: Secondary | ICD-10-CM | POA: Diagnosis not present

## 2020-04-16 NOTE — Telephone Encounter (Signed)
Scheduled per los. Gave avs and calendar  

## 2020-04-16 NOTE — Progress Notes (Signed)
[facility] at [place], [place] [address] [phone]   Interval Evaluation  Date of Service: 04/16/20 Patient Name: James Olsen Patient MRN: [id number] Patient DOB: Feb 11, 1946 Provider: [name], MD  Identifying Statement:  JOHNNY KEIRNS is a 74 y.o. 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  He describes no new or progressive neurologic deficits.  Still occasionally has some word finding difficutly.  Denies seizures or headaches.  H+P (01/20/19) Patient presented to medical attention in the winter of 2019 after noticing a "bulging" coming from the top of his head.  He also had experienced some numbness of his left leg at the time.  He obtained an MRI which demonstrated a dural based mass extending through the skull.  After a period of monitoring he underwent resection on 10/29/18 with Dr. Christella Noa.  Post-operatively he was very weak on the right side and was found to have a stroke.  After extensive rehabilitation he regained most of his right sided function.  Because of residual tumor and histology (WHO II) radiation was started on 12/19 with Dr. Isidore Moos.  Next week is his final session which he has tolerated without ill effect.  Medications: Current Outpatient Medications on File Prior to Visit  Medication Sig Dispense Refill  . Multiple Vitamins-Minerals (PRESERVISION AREDS 2) CAPS Take 1 capsule by mouth 2 (two) times daily.     . Red Yeast Rice Extract 600 MG CAPS Take 2 tablets by mouth daily.     No current facility-administered medications on file prior to visit.    Allergies: No Known Allergies Past  Medical History:  Past Medical History:  Diagnosis Date  . Arthritis   . GERD (gastroesophageal reflux disease)   . Glaucoma    pt denies.   . History of blood transfusion     5 units after previous surgery  . History of radiation therapy 12/09/18- 01/24/2019   Left sagittal brain, 11.8 Gy in 31 fractions for a total dose of 55.8 Gy  . Macular degeneration   . Stroke [facility])    after surgery in August 2019 mostly recovered still some weakness in left arm  . Wears glasses    Past Surgical History:  Past Surgical History:  Procedure Laterality Date  . CHOLECYSTECTOMY  2008   lap choli  . COLONOSCOPY    . CRANIOPLASTY N/A 10/29/2018   Procedure: CRANIOPLASTY;  Surgeon: [name], MD;  Location: [facility];  Service: Neurosurgery;  Laterality: N/A;  CRANIOPLASTY  . CRANIOTOMY N/A 08/18/2018   Procedure: CRANIOTOMY TUMOR EXCISION;  Surgeon: [name], MD;  Location: [place] [facility];  Service: Neurosurgery;  Laterality: N/A;  CRANIOTOMY TUMOR EXCISION  . NAILBED REPAIR Left 05/04/2014   Procedure: LEFT INDEX NAIL ABLATION V-Y FLAP COVERAGE;  Surgeon: [name]., MD;  Location: [facility];  Service: Orthopedics;  Laterality: Left;  index  . PILONIDAL CYST EXCISION     x2  . TONSILLECTOMY    . VASECTOMY     Social History:  Social History   Socioeconomic History  . Marital status: Married    Spouse name: Not on file  . Number of children: Not  on file  . Years of education: Not on file  . Highest education level: Not on file  Occupational History  . Not on file  Tobacco Use  . Smoking status: Never Smoker  . Smokeless tobacco: Never Used  Substance and Sexual Activity  . Alcohol use: Yes    Comment: occasionally  . Drug use: No  . Sexual activity: Not on file  Other Topics Concern  . Not on file  Social History Narrative  . Not on file   Social Determinants of Health   Financial Resource Strain:   . Difficulty of Paying Living Expenses:   Food Insecurity:    . Worried About Charity fundraiser in the Last Year:   . Arboriculturist in the Last Year:   Transportation Needs:   . Film/video editor (Medical):   Marland Kitchen Lack of Transportation (Non-Medical):   Physical Activity:   . Days of Exercise per Week:   . Minutes of Exercise per Session:   Stress:   . Feeling of Stress :   Social Connections:   . Frequency of Communication with Friends and Family:   . Frequency of Social Gatherings with Friends and Family:   . Attends Religious Services:   . Active Member of Clubs or Organizations:   . Attends Archivist Meetings:   Marland Kitchen Marital Status:   Intimate Partner Violence:   . Fear of Current or Ex-Partner:   . Emotionally Abused:   Marland Kitchen Physically Abused:   . Sexually Abused:    Family History:  Family History  Problem Relation Age of Onset  . Diabetes Mother   . Skin cancer Brother   . Congenital heart disease Brother     Review of Systems: Constitutional: Denies fevers, chills or abnormal weight loss Eyes: Denies blurriness of vision Ears, nose, mouth, throat, and face: Denies mucositis or sore throat Respiratory: Denies cough, dyspnea or wheezes Cardiovascular: Denies palpitation, chest discomfort or lower extremity swelling Gastrointestinal:  Denies nausea, constipation, diarrhea GU: Denies dysuria or incontinence Skin: Denies abnormal skin rashes Neurological: Per HPI Musculoskeletal: Denies joint pain, back or neck discomfort. No decrease in ROM Behavioral/Psych: Denies anxiety, disturbance in thought content, and mood instability  Physical Exam: Vitals:   04/16/20 0955  BP: 126/69  Pulse: 64  Resp: 18  Temp: 98.9 F (37.2 C)  SpO2: 100%   KPS: 90. General: Alert, cooperative, pleasant, in no acute distress Head: Craniotomy scar noted, dry and intact. EENT: No conjunctival injection or scleral icterus. Oral mucosa moist Lungs: Resp effort normal Cardiac: Regular rate and rhythm Abdomen: Soft,  non-distended abdomen Skin: No rashes cyanosis or petechiae. Extremities: No clubbing or edema  Neurologic Exam: Mental Status: Awake, alert, attentive to examiner. Oriented to self and environment. Language is fluent with intact comprehension.  Cranial Nerves: Visual acuity is grossly normal. Visual fields are full. Extra-ocular movements intact. No ptosis. Face is symmetric, tongue midline. Motor: Tone and bulk are normal. Power is full in both arms and legs. Reflexes are symmetric, no pathologic reflexes present. Intact finger to nose bilaterally Sensory: Intact to light touch and temperature Gait: Normal and tandem gait is normal.   Labs: I have reviewed the data as listed    Component Value Date/Time   NA 141 08/30/2018 0524   K 3.7 08/30/2018 0524   CL 107 08/30/2018 0524   CO2 26 08/30/2018 0524   GLUCOSE 116 (H) 08/30/2018 0524   BUN 16 08/30/2018 0524   CREATININE  0.95 11/26/2018 1231   CALCIUM 8.5 (L) 08/30/2018 0524   PROT 5.5 (L) 08/25/2018 0928   ALBUMIN 2.9 (L) 08/25/2018 0928   AST 22 08/25/2018 0928   ALT 45 (H) 08/25/2018 0928   ALKPHOS 51 08/25/2018 0928   BILITOT 0.8 08/25/2018 0928   GFRNONAA >60 11/26/2018 1231   GFRAA >60 11/26/2018 1231   Lab Results  Component Value Date   WBC 6.3 10/22/2018   NEUTROABS 11.4 (H) 08/25/2018   HGB 13.5 10/22/2018   HCT 43.5 10/22/2018   MCV 93.1 10/22/2018   PLT 171 10/22/2018    Imaging:  [place] Clinician Interpretation: I have personally reviewed the CNS images as listed.  My interpretation, in the context of the patient's clinical presentation, is stable disease  MR Brain W Wo Contrast  Result Date: 04/13/2020 CLINICAL DATA:  Follow-up meningioma treated with surgery and radiation. EXAM: MRI HEAD WITHOUT AND WITH CONTRAST TECHNIQUE: Multiplanar, multiecho pulse sequences of the brain and surrounding structures were obtained without and with intravenous contrast. CONTRAST:  41mL MULTIHANCE GADOBENATE DIMEGLUMINE  529 MG/ML IV SOLN COMPARISON:  10/11/2019 FINDINGS: Brain: Diffusion imaging does not show any acute or subacute infarction. No abnormality is seen affecting the brainstem or cerebellum. Right cerebral hemisphere shows moderate chronic small-vessel change of the white matter without recent insult. On the left, there is been previous craniotomy at the frontoparietal vertex for resection of meningioma. Gliosis of the underlying brain is stable. Chronic small-vessel changes also affect the left cerebral hemisphere. Residual meningioma tumor at the vertex is stable, measuring approximately 4.4 x 2.5 x 1.3 cm. Tumor continues to fill the superior sagittal sinus. No significant mass-effect upon the brain. No hydrocephalus or extra-axial fluid collection. Vascular: Major vessels at the base of the brain show flow. Skull and upper cervical spine: Otherwise negative Sinuses/Orbits: Clear/normal. Insignificant retention cyst at the left maxillary sinus floor. Other: None IMPRESSION: Stable examination since October of last year. Residual meningioma tumor at the left frontoparietal vertex measuring approximately 4.4 x 2.5 x 1.3 cm, filling the superior sagittal sinus. Chronic atrophy and gliosis at the left frontoparietal vertex subsequent to previous resection and radiation. Electronically Signed   By: Nelson Chimes M.D.   On: 04/13/2020 22:28    Assessment/Plan 1. 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   He should return to clinic in 9 months with next MRI brain for evaluation.  No further intervention at this time.  All questions were answered. The patient knows to call the clinic with any problems, questions or concerns. No barriers to learning were detected.  The total time spent in the encounter was 30 minutes and more than 50% was on counseling and review of test results   [name], MD Medical Director of Neuro-Oncology [facility] at [place] 04/16/20 10:09 AM

## 2020-04-18 ENCOUNTER — Inpatient Hospital Stay: Payer: Medicare Other

## 2020-05-17 ENCOUNTER — Other Ambulatory Visit: Payer: Self-pay | Admitting: Radiation Therapy

## 2020-06-20 ENCOUNTER — Encounter (INDEPENDENT_AMBULATORY_CARE_PROVIDER_SITE_OTHER): Payer: Medicare Other | Admitting: Ophthalmology

## 2020-06-20 ENCOUNTER — Other Ambulatory Visit: Payer: Self-pay

## 2020-06-20 DIAGNOSIS — H353132 Nonexudative age-related macular degeneration, bilateral, intermediate dry stage: Secondary | ICD-10-CM

## 2020-06-20 DIAGNOSIS — H43813 Vitreous degeneration, bilateral: Secondary | ICD-10-CM | POA: Diagnosis not present

## 2020-06-20 DIAGNOSIS — H2513 Age-related nuclear cataract, bilateral: Secondary | ICD-10-CM | POA: Diagnosis not present

## 2020-10-17 ENCOUNTER — Telehealth: Payer: Self-pay | Admitting: Internal Medicine

## 2020-10-17 NOTE — Telephone Encounter (Signed)
Rescheduled appt on 1/17 to 1/18. Provider on pal. Pt is aware of appt time and date.

## 2020-11-07 ENCOUNTER — Ambulatory Visit (INDEPENDENT_AMBULATORY_CARE_PROVIDER_SITE_OTHER): Payer: Medicare Other

## 2020-11-07 ENCOUNTER — Ambulatory Visit (INDEPENDENT_AMBULATORY_CARE_PROVIDER_SITE_OTHER): Payer: Medicare Other | Admitting: Orthopaedic Surgery

## 2020-11-07 ENCOUNTER — Encounter: Payer: Self-pay | Admitting: Orthopaedic Surgery

## 2020-11-07 VITALS — Ht 69.0 in | Wt 185.8 lb

## 2020-11-07 DIAGNOSIS — M25562 Pain in left knee: Secondary | ICD-10-CM

## 2020-11-07 NOTE — Progress Notes (Signed)
Office Visit Note   Patient: James Olsen           Date of Birth: July 11, 1946           MRN: [id number] Visit Date: 11/07/2020              Requested by: [name], MD [name],  [place] [address] PCP: [name], MD   Assessment & Plan: Visit Diagnoses:  1. Left knee pain, unspecified chronicity     Plan: I gave him reassurance that I see no significant mechanical derangements with his knee and no severe arthritic changes.  He should expect clicking from time to time and he can work on quad strengthening exercises and occasional anti-inflammatory if needed.  Certainly if he becomes more symptomatic we can see him back because we would consider a MRI of his knee to assess for loose body but, based on his clinical exam today and his signs and symptoms as well as his x-ray findings, I would recommend just watching this for now.  He agrees.  All question concerns were answered and addressed.  Follow-up can be as needed.  Follow-Up Instructions: Return if symptoms worsen or fail to improve.   Orders:  Orders Placed This Encounter  Procedures  . XR Knee 1-2 Views Left   No orders of the defined types were placed in this encounter.     Procedures: No procedures performed   Clinical Data: No additional findings.   Subjective: Chief Complaint  Patient presents with  . Left Knee - Pain  The patient is a very pleasant 74 year old gentleman seen for the first time.  He is active and reports some pain with clicking at the lateral aspect of his left knee that he noticed about a month ago.  He says is more when he goes up and down stairs when he hears a clicking sound but now is having no symptoms and no pain.  He said no type of therapy or injections or ever had surgery on that knee.  He does take Tylenol as needed for pain.  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  He has been able to get most of that strength back.  He tries to  stay as active as possible.  He otherwise denies any acute changes in his medical status.  He is not a diabetic and not a smoker.  HPI  Review of Systems He currently denies any headache, chest pain, shortness of breath, fever, chills, nausea, vomiting  Objective: Vital Signs: Ht 5\' 9"  (1.753 m)   Wt 185 lb 12.8 oz (84.3 kg)   BMI 27.44 kg/m   Physical Exam He is alert and oriented x3 and in no acute distress Ortho Exam Examination of his left knee shows some patellofemoral crepitation with flexion and extension that is mild.  This is the same on the right side.  There is no effusion.  His range of motion is full of both knees.  His left symptomatic knee is ligamentously stable.  His Lachman's and McMurray's exam are negative. Specialty Comments:  No specialty comments available.  Imaging: XR Knee 1-2 Views Left  Result Date: 11/07/2020 2 views of the left knee show no acute findings.  There is only mild arthritic changes.  There is no malalignment.  There is no effusion.    PMFS History: Patient Active Problem List   Diagnosis Date Noted  . 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 Meningioma ([name]) 10/29/2018  . Other abnormalities of gait and mobility 09/17/2018  . Acute cerebral infarction ([name])   . Thrombocytopenia ([name])   . 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 Acute blood loss anemia   . Generalized OA   . Gastroesophageal reflux disease   . Seizure prophylaxis   . Leukocytosis   . Tumor 08/18/2018  . Meningioma determined by biopsy of brain ([name]) 08/18/2018  . MACULAR DEGENERATION 02/29/2008  . ALLERGY 02/29/2008  . Nonspecific (abnormal) findings on radiological and other examination of body structure 12/26/2007  . NONSPECIFIC ABNORM FIND RAD&OTH EXAM LUNG FIELD 12/26/2007  . GALLSTONES 10/01/2007  . ELEVATION, TRANSAMINASE/LDH LEVELS 09/06/2007  . HEPATITIS NOS 09/01/2007  . ABDOMINAL PAIN, EPIGASTRIC  09/01/2007   Past Medical History:  Diagnosis Date  . Arthritis   . GERD (gastroesophageal reflux disease)   . Glaucoma    pt denies.   . History of blood transfusion     5 units after previous surgery  . History of radiation therapy 12/09/18- 01/24/2019   Left sagittal brain, 11.8 Gy in 31 fractions for a total dose of 55.8 Gy  . Macular degeneration   . Stroke [name])    after surgery in August 2019 mostly recovered still some weakness in left arm  . Wears glasses     Family History  Problem Relation Age of Onset  . Diabetes Mother   . Skin cancer Brother   . Congenital heart disease Brother     Past Surgical History:  Procedure Laterality Date  . CHOLECYSTECTOMY  2008   lap choli  . COLONOSCOPY    . CRANIOPLASTY N/A 10/29/2018   Procedure: CRANIOPLASTY;  Surgeon: [name], MD;  Location: [place];  Service: Neurosurgery;  Laterality: N/A;  CRANIOPLASTY  . CRANIOTOMY N/A 08/18/2018   Procedure: CRANIOTOMY TUMOR EXCISION;  Surgeon: [name], MD;  Location: [place];  Service: Neurosurgery;  Laterality: N/A;  CRANIOTOMY TUMOR EXCISION  . NAILBED REPAIR Left 05/04/2014   Procedure: LEFT INDEX NAIL ABLATION V-Y FLAP COVERAGE;  Surgeon: [name]., MD;  Location: [place];  Service: Orthopedics;  Laterality: Left;  index  . PILONIDAL CYST EXCISION     x2  . TONSILLECTOMY    . VASECTOMY     Social History   Occupational History  . Not on file  Tobacco Use  . Smoking status: Never Smoker  . Smokeless tobacco: Never Used  Vaping Use  . Vaping Use: Never used  Substance and Sexual Activity  . Alcohol use: Yes    Comment: occasionally  . Drug use: No  . Sexual activity: Not on file

## 2021-01-04 ENCOUNTER — Other Ambulatory Visit: Payer: Self-pay

## 2021-01-04 ENCOUNTER — Ambulatory Visit
Admission: RE | Admit: 2021-01-04 | Discharge: 2021-01-04 | Disposition: A | Discharge status: Home / Self Care | Payer: Medicare Other | Source: Ambulatory Visit | Attending: Internal Medicine | Admitting: Internal Medicine

## 2021-01-04 DIAGNOSIS — D420 Neoplasm of uncertain behavior of cerebral meninges: Secondary | ICD-10-CM

## 2021-01-04 MED ORDER — GADOBENATE DIMEGLUMINE 529 MG/ML IV SOLN
17.0000 mL | Freq: Once | INTRAVENOUS | Status: AC | PRN
  Administered 2021-01-04: 17 mL via INTRAVENOUS

## 2021-01-07 ENCOUNTER — Ambulatory Visit: Payer: Medicare Other | Admitting: Internal Medicine

## 2021-01-07 ENCOUNTER — Telehealth: Payer: Self-pay | Admitting: Internal Medicine

## 2021-01-07 NOTE — Telephone Encounter (Signed)
Rescheduled 01/18 appointment to 01/20 due to weather conditions, patient has been called and notified.

## 2021-01-08 ENCOUNTER — Inpatient Hospital Stay: Payer: Medicare Other | Admitting: Internal Medicine

## 2021-01-10 ENCOUNTER — Inpatient Hospital Stay: Payer: Medicare Other | Attending: Internal Medicine | Admitting: Internal Medicine

## 2021-01-10 ENCOUNTER — Other Ambulatory Visit: Payer: Self-pay

## 2021-01-10 VITALS — BP 137/71 | HR 84 | Temp 98.1°F | Resp 20 | Ht 69.0 in | Wt 189.4 lb

## 2021-01-10 DIAGNOSIS — D320 Benign neoplasm of cerebral meninges: Secondary | ICD-10-CM | POA: Insufficient documentation

## 2021-01-10 DIAGNOSIS — D420 Neoplasm of uncertain behavior of cerebral meninges: Secondary | ICD-10-CM | POA: Diagnosis not present

## 2021-01-10 NOTE — Progress Notes (Signed)
[facility] [place], [place] [address] [phone]   Interval Evaluation  Date of Service: 01/10/21 Patient Name: James Olsen Patient MRN: [id number] Patient DOB: 1946/03/06 Provider: [name], MD  Identifying Statement:  CLARNCE YEP is a 75 y.o. 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  He denies new or progressive neurologic deficits.  Still occasionally has some word finding difficutly.  Denies seizures or headaches.  H+P (01/20/19) Patient presented to medical attention in the winter of 2019 after noticing a "bulging" coming from the top of his head.  He also had experienced some numbness of his left leg at the time.  He obtained an MRI which demonstrated a dural based mass extending through the skull.  After a period of monitoring he underwent resection on 10/29/18 with Dr. Christella Noa.  Post-operatively he was very weak on the right side and was found to have a stroke.  After extensive rehabilitation he regained most of his right sided function.  Because of residual tumor and histology (WHO II) radiation was started on 12/19 with Dr. Isidore Moos.  Next week is his final session which he has tolerated without ill effect.  Medications: Current Outpatient Medications on File Prior to Visit  Medication Sig Dispense Refill  . Multiple Vitamins-Minerals (PRESERVISION AREDS 2) CAPS Take 1 capsule by mouth 2 (two) times daily.     . Red Yeast Rice Extract 600 MG CAPS Take 2 tablets by mouth daily.     No current facility-administered medications on file prior to visit.    Allergies: No Known Allergies Past Medical  History:  Past Medical History:  Diagnosis Date  . Arthritis   . GERD (gastroesophageal reflux disease)   . Glaucoma    pt denies.   . History of blood transfusion     5 units after previous surgery  . History of radiation therapy 12/09/18- 01/24/2019   Left sagittal brain, 11.8 Gy in 31 fractions for a total dose of 55.8 Gy  . Macular degeneration   . Stroke [facility])    after surgery in August 2019 mostly recovered still some weakness in left arm  . Wears glasses    Past Surgical History:  Past Surgical History:  Procedure Laterality Date  . CHOLECYSTECTOMY  2008   lap choli  . COLONOSCOPY    . CRANIOPLASTY N/A 10/29/2018   Procedure: CRANIOPLASTY;  Surgeon: [name], MD;  Location: [place];  Service: Neurosurgery;  Laterality: N/A;  CRANIOPLASTY  . CRANIOTOMY N/A 08/18/2018   Procedure: CRANIOTOMY TUMOR EXCISION;  Surgeon: [name], MD;  Location: [place];  Service: Neurosurgery;  Laterality: N/A;  CRANIOTOMY TUMOR EXCISION  . NAILBED REPAIR Left 05/04/2014   Procedure: LEFT INDEX NAIL ABLATION V-Y FLAP COVERAGE;  Surgeon: [name]., MD;  Location: [place];  Service: Orthopedics;  Laterality: Left;  index  . PILONIDAL CYST EXCISION     x2  . TONSILLECTOMY    . VASECTOMY     Social History:  Social History   Socioeconomic History  . Marital status: Married    Spouse name: Not on file  . Number of children: Not on  file  . Years of education: Not on file  . Highest education level: Not on file  Occupational History  . Not on file  Tobacco Use  . Smoking status: Never Smoker  . Smokeless tobacco: Never Used  Vaping Use  . Vaping Use: Never used  Substance and Sexual Activity  . Alcohol use: Yes    Comment: occasionally  . Drug use: No  . Sexual activity: Not on file  Other Topics Concern  . Not on file  Social History Narrative  . Not on file   Social Determinants of Health   Financial Resource Strain: Not on file  Food Insecurity:  Not on file  Transportation Needs: Not on file  Physical Activity: Not on file  Stress: Not on file  Social Connections: Not on file  Intimate Partner Violence: Not on file   Family History:  Family History  Problem Relation Age of Onset  . Diabetes Mother   . Skin cancer Brother   . Congenital heart disease Brother     Review of Systems: Constitutional: Denies fevers, chills or abnormal weight loss Eyes: Denies blurriness of vision Ears, nose, mouth, throat, and face: Denies mucositis or sore throat Respiratory: Denies cough, dyspnea or wheezes Cardiovascular: Denies palpitation, chest discomfort or lower extremity swelling Gastrointestinal:  Denies nausea, constipation, diarrhea GU: Denies dysuria or incontinence Skin: Denies abnormal skin rashes Neurological: Per HPI Musculoskeletal: Denies joint pain, back or neck discomfort. No decrease in ROM Behavioral/Psych: Denies anxiety, disturbance in thought content, and mood instability  Physical Exam: Vitals:   01/10/21 0921  BP: 137/71  Pulse: 84  Resp: 20  Temp: 98.1 F (36.7 C)  SpO2: 100%   KPS: 90. General: Alert, cooperative, pleasant, in no acute distress Head: Craniotomy scar noted, dry and intact. EENT: No conjunctival injection or scleral icterus. Oral mucosa moist Lungs: Resp effort normal Cardiac: Regular rate and rhythm Abdomen: Soft, non-distended abdomen Skin: No rashes cyanosis or petechiae. Extremities: No clubbing or edema  Neurologic Exam: Mental Status: Awake, alert, attentive to examiner. Oriented to self and environment. Language is fluent with intact comprehension.  Cranial Nerves: Visual acuity is grossly normal. Visual fields are full. Extra-ocular movements intact. No ptosis. Face is symmetric, tongue midline. Motor: Tone and bulk are normal. Power is full in both arms and legs. Reflexes are symmetric, no pathologic reflexes present. Intact finger to nose bilaterally Sensory: Intact to light  touch and temperature Gait: Normal and tandem gait is normal.   Labs: I have reviewed the data as listed    Component Value Date/Time   NA 141 08/30/2018 0524   K 3.7 08/30/2018 0524   CL 107 08/30/2018 0524   CO2 26 08/30/2018 0524   GLUCOSE 116 (H) 08/30/2018 0524   BUN 16 08/30/2018 0524   CREATININE 0.95 11/26/2018 1231   CALCIUM 8.5 (L) 08/30/2018 0524   PROT 5.5 (L) 08/25/2018 0928   ALBUMIN 2.9 (L) 08/25/2018 0928   AST 22 08/25/2018 0928   ALT 45 (H) 08/25/2018 0928   ALKPHOS 51 08/25/2018 0928   BILITOT 0.8 08/25/2018 0928   GFRNONAA >60 11/26/2018 1231   GFRAA >60 11/26/2018 1231   Lab Results  Component Value Date   WBC 6.3 10/22/2018   NEUTROABS 11.4 (H) 08/25/2018   HGB 13.5 10/22/2018   HCT 43.5 10/22/2018   MCV 93.1 10/22/2018   PLT 171 10/22/2018    Imaging:  [facility] Clinician Interpretation: I have personally reviewed the CNS images as listed.  My  interpretation, in the context of the patient's clinical presentation, is treatment effect vs true progression  MR BRAIN W WO CONTRAST  Result Date: 01/04/2021 CLINICAL DATA:  Follow-up meningioma.  Surgery and radiation. EXAM: MRI HEAD WITHOUT AND WITH CONTRAST TECHNIQUE: Multiplanar, multiecho pulse sequences of the brain and surrounding structures were obtained without and with intravenous contrast. CONTRAST:  60mL MULTIHANCE GADOBENATE DIMEGLUMINE 529 MG/ML IV SOLN COMPARISON:  04/13/2020.  10/11/2019. FINDINGS: Brain: Diffusion imaging does not show any acute or subacute infarction. No abnormality seen affecting the brainstem or cerebellum. The right cerebral hemisphere shows moderate chronic small-vessel change of the white matter without new insult. On the left there has been prior craniotomy at the frontoparietal vertex for resection of meningioma. Gliosis of the underlying brain is stable. No evidence of increasing edema or mass effect. Chronic small-vessel ischemic changes also present in the left hemisphere.  Residual meningioma tumor at the vertex shows a stable appearance of the nodular component adjacent to the sinus which measures 17 x 13 x 12 mm. The amount of tumor within the superior sagittal sinus itself has grown. The anterior margin appears quite similar, but there is tumor growth extending posterior when compared to the previous exam. This is difficult to measure precisely but is conclusively present, probably best illustrated by comparing the coronal imaging. I would estimate that the amount of tumor in the superior sagittal sinus is self has increased by approximately 30-50%. No hydrocephalus.  No extra-axial fluid collection. Vascular: Major vessels at the base of the brain show flow. Skull and upper cervical spine: Otherwise negative Sinuses/Orbits: Clear sinuses except for a retention cyst of the left maxillary sinus. Orbits negative. Other: None IMPRESSION: Stable appearance of the residual nodular component of the meningioma tumor at the left frontoparietal vertex adjacent to the sinus measuring 17 x 13 x 12 mm. The amount of tumor within the superior sagittal sinus itself has grown, by subjective estimation, by approximately 30-50%. The anterior margin of the intra sinus tumor appears quite similar, but there is tumor growth extending posterior from that region. The change is best demonstrated by comparing the coronal postcontrast imaging Electronically Signed   By: Nelson Chimes M.D.   On: 01/04/2021 11:08    Assessment/Plan 1. 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  MRI demonstrates subtle changes surrounding the posterior aspect of mass, adjacent to or invading the saggittal sinus.  These changes are over 9 months interval, and are possibly related to post-radiation treatment effect vs imaging artifact vs progression of disease.    Case/imaging will be reviewed in detail next week in brain/spine tumor board with neuroradiology.     In the  meantime, we recommend he return to clinic in 4 months with next MRI brain for evaluation, given changes seen today.    All questions were answered. The patient knows to call the clinic with any problems, questions or concerns. No barriers to learning were detected.  The total time spent in the encounter was 30 minutes and more than 50% was on counseling and review of test results   [name], MD Medical Director of Neuro-Oncology [facility] 01/10/21 9:26 AM

## 2021-01-11 ENCOUNTER — Ambulatory Visit: Payer: Medicare Other | Admitting: Internal Medicine

## 2021-01-14 ENCOUNTER — Inpatient Hospital Stay: Payer: Medicare Other

## 2021-01-14 ENCOUNTER — Other Ambulatory Visit: Payer: Self-pay | Admitting: Internal Medicine

## 2021-01-14 DIAGNOSIS — D420 Neoplasm of uncertain behavior of cerebral meninges: Secondary | ICD-10-CM

## 2021-01-23 ENCOUNTER — Other Ambulatory Visit: Payer: Self-pay | Admitting: Radiation Therapy

## 2021-03-13 ENCOUNTER — Ambulatory Visit (INDEPENDENT_AMBULATORY_CARE_PROVIDER_SITE_OTHER): Payer: Medicare Other | Admitting: Orthopaedic Surgery

## 2021-03-13 ENCOUNTER — Encounter: Payer: Self-pay | Admitting: Orthopaedic Surgery

## 2021-03-13 ENCOUNTER — Ambulatory Visit (INDEPENDENT_AMBULATORY_CARE_PROVIDER_SITE_OTHER): Payer: Medicare Other

## 2021-03-13 DIAGNOSIS — M25461 Effusion, right knee: Secondary | ICD-10-CM | POA: Diagnosis not present

## 2021-03-13 DIAGNOSIS — G8929 Other chronic pain: Secondary | ICD-10-CM

## 2021-03-13 DIAGNOSIS — M25561 Pain in right knee: Secondary | ICD-10-CM | POA: Diagnosis not present

## 2021-03-13 MED ORDER — LIDOCAINE HCL 1 % IJ SOLN
3.0000 mL | INTRAMUSCULAR | Status: AC | PRN
  Administered 2021-03-13: 3 mL

## 2021-03-13 MED ORDER — METHYLPREDNISOLONE ACETATE 40 MG/ML IJ SUSP
40.0000 mg | INTRAMUSCULAR | Status: AC | PRN
  Administered 2021-03-13: 40 mg via INTRA_ARTICULAR

## 2021-03-13 NOTE — Progress Notes (Signed)
Office Visit Note   Patient: James Olsen           Date of Birth: [birth year]           MRN: [id number] Visit Date: 03/13/2021              Requested by: [name], MD [place],  [place] [address] PCP: [name], MD   Assessment & Plan: Visit Diagnoses:  1. Right knee pain, unspecified chronicity   2. Chronic pain of right knee   3. Effusion, right knee     Plan: I did recommend an aspiration of his right knee and a steroid injection.  We talked about 20 cc of clear fluid off the knee and placed a sterile injection the right knee which gave him immediate relief.  If he continues to be symptomatic with locking catching and swelling of the knee an MRI would be warranted.  He is no work on activity modification and quad strengthening as well as oral anti-inflammatories of the next 2 weeks and we can see him back if he is still symptomatic at that standpoint.  Follow-Up Instructions: Return in about 2 weeks (around 03/27/2021).   Orders:  Orders Placed This Encounter  Procedures  . Large Joint Inj  . XR Knee 1-2 Views Right   No orders of the defined types were placed in this encounter.     Procedures: Large Joint Inj: R knee on 03/13/2021 8:48 AM Indications: diagnostic evaluation and pain Details: 22 G 1.5 in needle, superolateral approach  Arthrogram: No  Medications: 3 mL lidocaine 1 %; 40 mg methylPREDNISolone acetate 40 MG/ML Outcome: tolerated well, no immediate complications Procedure, treatment alternatives, risks and benefits explained, specific risks discussed. Consent was given by the patient. Immediately prior to procedure a time out was called to verify the correct patient, procedure, equipment, support staff and site/side marked as required. Patient was prepped and draped in the usual sterile fashion.       Clinical Data: No additional findings.   Subjective: Chief Complaint  Patient presents with  . Right Knee - Pain   The patient is a very pleasant 75 year old that I saw last November for left knee pain.  He comes in today with right knee pain and swelling has been going on for few weeks.  He feels like he may have rotated the knee different and twisted the knee and as we start develop some swelling.  He is also had some burning on the medial side of the knee and some pain behind the knee itself in the back but no locking catching.  He says the knee does feel warm.  He is never injured this knee before and not had surgery.  He is not a diabetic.  This is a new injury.  HPI  Review of Systems He currently denies any headache, chest pain, shortness of breath, fever, chills, nausea, vomiting  Objective: Vital Signs: There were no vitals taken for this visit.  Physical Exam He is alert and oriented x3 and in no acute distress Ortho Exam Examination of his right knee does show a mild effusion and slight warmth but no redness.  He has tenderness along the medial joint line and painful range of motion of that knee.  Of note he says he likes to sleep with the right knee in the flexed position to offload the pain. Specialty Comments:  No specialty comments available.  Imaging: XR Knee 1-2 Views Right  Result Date: 03/13/2021 An AP and lateral right knee show no acute findings other than mild effusion.  The joint space is well-maintained.    PMFS History: Patient Active Problem List   Diagnosis Date Noted  . 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 Meningioma ([place]) 10/29/2018  . Other abnormalities of gait and mobility 09/17/2018  . Acute cerebral infarction ([place])   . Thrombocytopenia ([place])   . 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 Acute blood loss anemia   . Generalized OA   . Gastroesophageal reflux disease   . Seizure prophylaxis   . Leukocytosis   . Tumor 08/18/2018  . Meningioma determined by biopsy of brain ([place]) 08/18/2018  . MACULAR  DEGENERATION 02/29/2008  . ALLERGY 02/29/2008  . Nonspecific (abnormal) findings on radiological and other examination of body structure 12/26/2007  . NONSPECIFIC ABNORM FIND RAD&OTH EXAM LUNG FIELD 12/26/2007  . GALLSTONES 10/01/2007  . ELEVATION, TRANSAMINASE/LDH LEVELS 09/06/2007  . HEPATITIS NOS 09/01/2007  . ABDOMINAL PAIN, EPIGASTRIC 09/01/2007   Past Medical History:  Diagnosis Date  . Arthritis   . GERD (gastroesophageal reflux disease)   . Glaucoma    pt denies.   . History of blood transfusion     5 units after previous surgery  . History of radiation therapy 12/09/18- 01/24/2019   Left sagittal brain, 11.8 Gy in 31 fractions for a total dose of 55.8 Gy  . Macular degeneration   . Stroke [facility])    after surgery in August 2019 mostly recovered still some weakness in left arm  . Wears glasses     Family History  Problem Relation Age of Onset  . Diabetes Mother   . Skin cancer Brother   . Congenital heart disease Brother     Past Surgical History:  Procedure Laterality Date  . CHOLECYSTECTOMY  2008   lap choli  . COLONOSCOPY    . CRANIOPLASTY N/A 10/29/2018   Procedure: CRANIOPLASTY;  Surgeon: [name], MD;  Location: [place];  Service: Neurosurgery;  Laterality: N/A;  CRANIOPLASTY  . CRANIOTOMY N/A 08/18/2018   Procedure: CRANIOTOMY TUMOR EXCISION;  Surgeon: [name], MD;  Location: [place];  Service: Neurosurgery;  Laterality: N/A;  CRANIOTOMY TUMOR EXCISION  . NAILBED REPAIR Left 05/04/2014   Procedure: LEFT INDEX NAIL ABLATION V-Y FLAP COVERAGE;  Surgeon: [name]., MD;  Location: [place];  Service: Orthopedics;  Laterality: Left;  index  . PILONIDAL CYST EXCISION     x2  . TONSILLECTOMY    . VASECTOMY     Social History   Occupational History  . Not on file  Tobacco Use  . Smoking status: Never Smoker  . Smokeless tobacco: Never Used  Vaping Use  . Vaping Use: Never used  Substance and Sexual Activity  . Alcohol use: Yes     Comment: occasionally  . Drug use: No  . Sexual activity: Not on file

## 2021-03-27 ENCOUNTER — Ambulatory Visit: Payer: Medicare Other | Admitting: Orthopaedic Surgery

## 2021-04-19 ENCOUNTER — Encounter (HOSPITAL_BASED_OUTPATIENT_CLINIC_OR_DEPARTMENT_OTHER): Payer: Self-pay | Admitting: *Deleted

## 2021-04-19 ENCOUNTER — Emergency Department (HOSPITAL_BASED_OUTPATIENT_CLINIC_OR_DEPARTMENT_OTHER): Payer: Medicare Other

## 2021-04-19 ENCOUNTER — Emergency Department (HOSPITAL_BASED_OUTPATIENT_CLINIC_OR_DEPARTMENT_OTHER)
Admission: EM | Admit: 2021-04-19 | Discharge: 2021-04-19 | Disposition: A | Discharge status: Home / Self Care | Payer: Medicare Other | Attending: Emergency Medicine | Admitting: Emergency Medicine

## 2021-04-19 ENCOUNTER — Other Ambulatory Visit: Payer: Self-pay

## 2021-04-19 DIAGNOSIS — R569 Unspecified convulsions: Secondary | ICD-10-CM | POA: Insufficient documentation

## 2021-04-19 LAB — COMPREHENSIVE METABOLIC PANEL
ALT: 13 U/L (ref 0–44)
AST: 13 U/L — ABNORMAL LOW (ref 15–41)
Albumin: 4.1 g/dL (ref 3.5–5.0)
Alkaline Phosphatase: 78 U/L (ref 38–126)
Anion gap: 11 (ref 5–15)
BUN: 17 mg/dL (ref 8–23)
CO2: 24 mmol/L (ref 22–32)
Calcium: 8.9 mg/dL (ref 8.9–10.3)
Chloride: 104 mmol/L (ref 98–111)
Creatinine, Ser: 0.88 mg/dL (ref 0.61–1.24)
GFR, Estimated: >60 mL/min (ref >60)
Glucose, Bld: 130 mg/dL — ABNORMAL HIGH (ref 70–99)
Potassium: 3.8 mmol/L (ref 3.5–5.1)
Sodium: 139 mmol/L (ref 135–145)
Total Bilirubin: 0.3 mg/dL (ref 0.3–1.2)
Total Protein: 6.4 g/dL — ABNORMAL LOW (ref 6.5–8.1)

## 2021-04-19 LAB — CBC WITH DIFFERENTIAL/PLATELET
Abs Immature Granulocytes: 0.01 10*3/uL (ref 0.00–0.07)
Basophils Absolute: 0.1 10*3/uL (ref 0.0–0.1)
Basophils Relative: 1 %
Eosinophils Absolute: 0.1 10*3/uL (ref 0.0–0.5)
Eosinophils Relative: 2 %
HCT: 42.4 % (ref 39.0–52.0)
Hemoglobin: 14.1 g/dL (ref 13.0–17.0)
Immature Granulocytes: 0 %
Lymphocytes Relative: 32 %
Lymphs Abs: 1.7 10*3/uL (ref 0.7–4.0)
MCH: 29.9 pg (ref 26.0–34.0)
MCHC: 33.3 g/dL (ref 30.0–36.0)
MCV: 89.8 fL (ref 80.0–100.0)
Monocytes Absolute: 0.5 10*3/uL (ref 0.1–1.0)
Monocytes Relative: 9 %
Neutro Abs: 2.9 10*3/uL (ref 1.7–7.7)
Neutrophils Relative %: 56 %
Platelets: 184 10*3/uL (ref 150–400)
RBC: 4.72 MIL/uL (ref 4.22–5.81)
RDW: 12.9 % (ref 11.5–15.5)
WBC: 5.2 10*3/uL (ref 4.0–10.5)
nRBC: 0 % (ref 0.0–0.2)

## 2021-04-19 MED ORDER — LEVETIRACETAM 500 MG PO TABS: 500.0000 mg | ORAL_TABLET | Freq: Two times a day (BID) | ORAL | 0 refills | Status: DC

## 2021-04-19 MED ORDER — LEVETIRACETAM IN NACL 1000 MG/100ML IV SOLN
1000.0000 mg | Freq: Once | INTRAVENOUS | Status: AC
  Administered 2021-04-19: 1000 mg via INTRAVENOUS
  Filled 2021-04-19: qty 100

## 2021-04-19 NOTE — Discharge Instructions (Signed)
You were seen in the emergency department today with seizure-like activity.  I am starting you on a new seizure medication called Keppra.  Please take as directed and follow with Dr. Mickeal Skinner moving forward.  Because you have likely had a seizure today you cannot drive a car until cleared to do so by a neurologist.   Please return to the emergency department any new or suddenly worsening symptoms.

## 2021-04-19 NOTE — ED Provider Notes (Signed)
Emergency Department Provider Note   I have reviewed the triage vital signs and the nursing notes.   HISTORY  Chief Complaint Numbness   HPI James Olsen is a 75 y.o. male with PMH of atypical meningioma s/p partial excision currently followed by Dr. Mickeal Skinner presents to the emergency department for evaluation after question of seizure activity while driving.  The patient states that he was in his normal state of health driving down the road toward his neighborhood when he began to feel "fidgety" in the RUE only.  He initially described it as a "numbness" but clarifies it was not exactly 9 is just he felt like he needed to move the arm.  He was feeling somewhat "out of it" more globally but denies any strange feeling in the legs or left arm.  No face symptoms.  He felt some "tunnel vision" developing and was able to pull the car over to the side of the road.  He kept his foot on the brake and states at that time his right arm began to raise up in the air and felt tense and tight.  He could not control this movement.  He states that his head also turned toward the raised arm and he could not turn his head back to the other side.  He estimates that this lasted for less than 1 minute.  He did not lose consciousness.  He did not lose control of his bladder or bite his tongue.  Denies any headaches.  He has no prior history of seizure.  He was on seizure prophylaxis after his excision of the meningioma with Dr. Christella Noa but has never had a seizure and is no longer on any seizure medication.  He states that he is due to see Dr. Mickeal Skinner out in several weeks for repeat imaging. No other medication changes. No radiation of symptoms or modifying factors.   Past Medical History:  Diagnosis Date  . Arthritis   . GERD (gastroesophageal reflux disease)   . Glaucoma    pt denies.   . History of blood transfusion     5 units after previous surgery  . History of radiation therapy 12/09/18- 01/24/2019    Left sagittal brain, 11.8 Gy in 31 fractions for a total dose of 55.8 Gy  . Macular degeneration   . Stroke [facility])    after surgery in August 2019 mostly recovered still some weakness in left arm  . Wears glasses     Patient Active Problem List   Diagnosis Date Noted  . 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[place]) 11/13/2018  . Meningioma ([facility]) 10/29/2018  . Other abnormalities of gait and mobility 09/17/2018  . Acute cerebral infarction ([place])   . Thrombocytopenia ([place])   . 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 Acute blood loss anemia   . Generalized OA   . Gastroesophageal reflux disease   . Seizure prophylaxis   . Leukocytosis   . Tumor 08/18/2018  . Meningioma determined by biopsy of brain ([place]) 08/18/2018  . MACULAR DEGENERATION 02/29/2008  . ALLERGY 02/29/2008  . Nonspecific (abnormal) findings on radiological and other examination of body structure 12/26/2007  . NONSPECIFIC ABNORM FIND RAD&OTH EXAM LUNG FIELD 12/26/2007  . GALLSTONES 10/01/2007  . ELEVATION, TRANSAMINASE/LDH LEVELS 09/06/2007  . HEPATITIS NOS 09/01/2007  . ABDOMINAL PAIN, EPIGASTRIC 09/01/2007    Past Surgical History:  Procedure Laterality Date  . CHOLECYSTECTOMY  2008   lap choli  .  COLONOSCOPY    . CRANIOPLASTY N/A 10/29/2018   Procedure: CRANIOPLASTY;  Surgeon: [name], MD;  Location: [place];  Service: Neurosurgery;  Laterality: N/A;  CRANIOPLASTY  . CRANIOTOMY N/A 08/18/2018   Procedure: CRANIOTOMY TUMOR EXCISION;  Surgeon: [name], MD;  Location: [place];  Service: Neurosurgery;  Laterality: N/A;  CRANIOTOMY TUMOR EXCISION  . NAILBED REPAIR Left 05/04/2014   Procedure: LEFT INDEX NAIL ABLATION V-Y FLAP COVERAGE;  Surgeon: [name]., MD;  Location: [place];  Service: Orthopedics;  Laterality: Left;  index  . PILONIDAL CYST EXCISION     x2  . TONSILLECTOMY    . VASECTOMY      Allergies Patient has no known  allergies.  Family History  Problem Relation Age of Onset  . Diabetes Mother   . Skin cancer Brother   . Congenital heart disease Brother     Social History Social History   Tobacco Use  . Smoking status: Never Smoker  . Smokeless tobacco: Never Used  Vaping Use  . Vaping Use: Never used  Substance Use Topics  . Alcohol use: Yes    Comment: occasionally  . Drug use: No    Review of Systems  Constitutional: No fever/chills Eyes: Positive episode of tunnel vision.  ENT: No sore throat. Cardiovascular: Denies chest pain. Respiratory: Denies shortness of breath. Gastrointestinal: No abdominal pain.  No nausea, no vomiting.  No diarrhea.  No constipation. Genitourinary: Negative for dysuria. Musculoskeletal: Negative for back pain. Skin: Negative for rash. Neurological: Negative for headaches, focal weakness or numbness. Positive uncontrollable RUE and head movements today.   10-point ROS otherwise negative.  ____________________________________________   PHYSICAL EXAM:  VITAL SIGNS: ED Triage Vitals  Enc Vitals Group     BP 04/19/21 1523 135/64     Pulse Rate 04/19/21 1523 85     Resp 04/19/21 1523 17     Temp 04/19/21 1523 98.2 F (36.8 C)     Temp Source 04/19/21 1523 Oral     SpO2 04/19/21 1523 100 %     Weight 04/19/21 1519 183 lb (83 kg)     Height 04/19/21 1519 5' 9.5" (1.765 m)   Constitutional: Alert and oriented. Well appearing and in no acute distress. Eyes: Conjunctivae are normal. PERRL. EOMI. Head: Atraumatic. Nose: No congestion/rhinnorhea. Mouth/Throat: Mucous membranes are moist.  Neck: No stridor.  Cardiovascular: Normal rate, regular rhythm. Good peripheral circulation. Grossly normal heart sounds.   Respiratory: Normal respiratory effort.  No retractions. Lungs CTAB. Gastrointestinal: Soft and nontender. No distention.  Musculoskeletal: No lower extremity tenderness nor edema. No gross deformities of extremities. Neurologic:  Normal  speech and language. No gross focal neurologic deficits are appreciated.  5/5 strength in the bilateral upper and lower extremities.  No pronator drift.  No facial asymmetry.  Skin:  Skin is warm, dry and intact. No rash noted.   ____________________________________________   LABS (all labs ordered are listed, but only abnormal results are displayed)  Labs Reviewed  COMPREHENSIVE METABOLIC PANEL - Abnormal; Notable for the following components:      Result Value   Glucose, Bld 130 (*)    Total Protein 6.4 (*)    AST 13 (*)    All other components within normal limits  CBC WITH DIFFERENTIAL/PLATELET   ____________________________________________  EKG   EKG Interpretation  Date/Time:  Friday April 19 2021 15:31:27 EDT Ventricular Rate:  91 PR Interval:  164 QRS Duration: 111 QT Interval:  365 QTC Calculation:  450 R Axis:   -6 Text Interpretation: Sinus rhythm Borderline repolarization abnormality Confirmed by Nanda Quinton [id number]) on 04/19/2021 6:43:47 PM       ____________________________________________  RADIOLOGY  CT Head Wo Contrast  Result Date: 04/19/2021 CLINICAL DATA:  Seizure-like activity EXAM: CT HEAD WITHOUT CONTRAST TECHNIQUE: Contiguous axial images were obtained from the base of the skull through the vertex without intravenous contrast. COMPARISON:  09/20/2018, correlation made with MRI 01/04/2021 FINDINGS: Brain: There is no acute intracranial hemorrhage or significant mass effect. Encephalomalacia/gliosis within the left frontal lobe underlying craniotomy similar in extent to prior MRI. Hyperdense residual tumor is present better evaluated on prior MRI. No new loss of gray-white differentiation. Additional patchy areas of hypoattenuation in the supratentorial white matter probably reflects similar chronic microvascular ischemic changes. No hydrocephalus. Vascular: No hyperdense vessel or unexpected calcification. Skull: Left frontal craniotomy with cranioplasty.  Sinuses/Orbits: No acute finding. Other: None. IMPRESSION: No acute intracranial hemorrhage or evidence of acute infarction. Significant mass effect. Left frontal encephalomalacia/gliosis similar to prior MRI. Residual tumor is present and probably similar in extent does not well evaluated. Electronically Signed   By: Macy Mis M.D.   On: 04/19/2021 16:23    ____________________________________________   PROCEDURES  Procedure(s) performed:   Procedures  None  ____________________________________________   INITIAL IMPRESSION / ASSESSMENT AND PLAN / ED COURSE  Pertinent labs & imaging results that were available during my care of the patient were reviewed by me and considered in my medical decision making (see chart for details).   Patient presents to the emergency department with history which seems most consistent with a partial seizure involving the right upper extremity and head.  He never fully lost consciousness and was able to pull his car safely to the side of the road without trauma.  Denies any chest pain or palpitations to suspect cardiogenic cause of symptoms.  He does have risk factors for seizure including meningioma followed by Dr. Mickeal Skinner. Plan for CT imaging of the head here and labs. Will have the patient evaluated by [facility]. Cautioned patient and wife at beside to not drive until cleared to do so by a neurologist.   Lab work and CT imaging reviewed.  Discussed the case with Dr. Leonel Ramsay by phone.  He recommends Keppra load of 1000 mg here followed by 500 mg of Keppra twice daily.  Patient to follow with Dr. Mickeal Skinner as an outpatient. He has an appointment in 2 weeks.  He will call the office on Monday to make them aware of the new symptoms and schedule a follow-up appointment sooner if needed.  Patient again advised not to drive and understands this.   Discussed ED return precautions.  ____________________________________________  FINAL CLINICAL IMPRESSION(S) /  ED DIAGNOSES  Final diagnoses:  Seizure-like activity ([place])     MEDICATIONS GIVEN DURING THIS VISIT:  Medications  levETIRAcetam (KEPPRA) IVPB 1000 mg/100 mL premix (0 mg Intravenous Stopped 04/19/21 1717)     NEW OUTPATIENT MEDICATIONS STARTED DURING THIS VISIT:  Discharge Medication List as of 04/19/2021  4:51 PM    START taking these medications   Details  levETIRAcetam (KEPPRA) 500 MG tablet Take 1 tablet (500 mg total) by mouth 2 (two) times daily., Starting Fri 04/19/2021, Until Sun 05/19/2021, Normal        Note:  This document was prepared using Dragon voice recognition software and may include unintentional dictation errors.  Nanda Quinton, MD, [facility] Emergency Medicine    Menno Vanbergen, [facility], MD 04/19/21 [phone]

## 2021-04-19 NOTE — ED Triage Notes (Signed)
Right arm numbness and feeling funny and out of control while he was driving an hour PTA.  Pt has a history of brain tumor.

## 2021-04-22 ENCOUNTER — Telehealth: Payer: Self-pay | Admitting: *Deleted

## 2021-04-22 NOTE — Telephone Encounter (Signed)
Patient called to advise that he had a seizure this past Friday (ER visit in system).  He was placed back on Keppra that he had previously come off of post surgery.  He states he has developed some new headaches/right foot and leg numbness.  He already has MRI scheduled 05/03/2021.  Per Dr Mickeal Skinner keep schedule as is and continue medication as it could be beneficial to his MRI results.  Patient was made aware to reach out if something changes between now and then.

## 2021-05-03 ENCOUNTER — Ambulatory Visit
Admission: RE | Admit: 2021-05-03 | Discharge: 2021-05-03 | Disposition: A | Discharge status: Home / Self Care | Payer: Medicare Other | Source: Ambulatory Visit | Attending: Internal Medicine | Admitting: Internal Medicine

## 2021-05-03 ENCOUNTER — Other Ambulatory Visit: Payer: Self-pay

## 2021-05-03 DIAGNOSIS — D420 Neoplasm of uncertain behavior of cerebral meninges: Secondary | ICD-10-CM

## 2021-05-03 MED ORDER — GADOBENATE DIMEGLUMINE 529 MG/ML IV SOLN
17.0000 mL | Freq: Once | INTRAVENOUS | Status: AC | PRN
  Administered 2021-05-03: 17 mL via INTRAVENOUS

## 2021-05-03 MED ORDER — IOPAMIDOL (ISOVUE-370) INJECTION 76%
75.0000 mL | Freq: Once | INTRAVENOUS | Status: AC | PRN
  Administered 2021-05-03: 75 mL via INTRAVENOUS

## 2021-05-06 ENCOUNTER — Inpatient Hospital Stay: Payer: Medicare Other | Attending: Internal Medicine

## 2021-05-06 ENCOUNTER — Other Ambulatory Visit: Payer: Self-pay

## 2021-05-06 ENCOUNTER — Inpatient Hospital Stay (HOSPITAL_BASED_OUTPATIENT_CLINIC_OR_DEPARTMENT_OTHER): Payer: Medicare Other | Admitting: Internal Medicine

## 2021-05-06 VITALS — BP 131/65 | HR 66 | Temp 97.5°F | Resp 14 | Ht 69.5 in | Wt 187.1 lb

## 2021-05-06 DIAGNOSIS — R569 Unspecified convulsions: Secondary | ICD-10-CM

## 2021-05-06 DIAGNOSIS — C713 Malignant neoplasm of parietal lobe: Secondary | ICD-10-CM | POA: Insufficient documentation

## 2021-05-06 DIAGNOSIS — D420 Neoplasm of uncertain behavior of cerebral meninges: Secondary | ICD-10-CM

## 2021-05-06 MED ORDER — LEVETIRACETAM 500 MG PO TABS: 500.0000 mg | ORAL_TABLET | Freq: Two times a day (BID) | ORAL | 3 refills | Status: DC

## 2021-05-06 NOTE — Progress Notes (Signed)
[facility] [place], [place] [address] [phone]   Interval Evaluation  Date of Service: 05/06/21 Patient Name: James Olsen Patient MRN: [id number] Patient DOB: 03-03-1946 Provider: [name], MD  Identifying Statement:  James Olsen is a 75 y.o. 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  He describes seizures episode while driving, characterized by "right side locking up, then shaking for 1 minute".  He felt "spaced out" but maintained awareness; the episode was followed by some post-event weakenss.  Since then, he has had several episodes of "marching right leg numbness, but only when standing".  Compliant with Keppra, no issues with it. Still occasionally has some word finding difficutly.  Having some mild headaches.  H+P (01/20/19) Patient presented to medical attention in the winter of 2019 after noticing a "bulging" coming from the top of his head.  He also had experienced some numbness of his left leg at the time.  He obtained an MRI which demonstrated a dural based mass extending through the skull.  After a period of monitoring he underwent resection on 10/29/18 with Dr. Christella Noa.  Post-operatively he was very weak on the right side and was found to have a stroke.  After extensive rehabilitation he regained most of his right sided function.  Because of residual tumor and histology (WHO II) radiation was started on 12/19 with Dr. Isidore Moos.  Next week is his final session which he has tolerated without ill effect.  Medications: Current Outpatient Medications on File Prior to Visit   Medication Sig Dispense Refill  . levETIRAcetam (KEPPRA) 500 MG tablet Take 1 tablet (500 mg total) by mouth 2 (two) times daily. 60 tablet 0  . Multiple Vitamins-Minerals (PRESERVISION AREDS 2) CAPS Take 1 capsule by mouth 2 (two) times daily.     . Red Yeast Rice Extract 600 MG CAPS Take 2 tablets by mouth daily.     No current facility-administered medications on file prior to visit.    Allergies: No Known Allergies Past Medical History:  Past Medical History:  Diagnosis Date  . Arthritis   . GERD (gastroesophageal reflux disease)   . Glaucoma    pt denies.   . History of blood transfusion     5 units after previous surgery  . History of radiation therapy 12/09/18- 01/24/2019   Left sagittal brain, 11.8 Gy in 31 fractions for a total dose of 55.8 Gy  . Macular degeneration   . Stroke [facility])    after surgery in August 2019 mostly recovered still some weakness in left arm  . Wears glasses    Past Surgical History:  Past Surgical History:  Procedure Laterality Date  . CHOLECYSTECTOMY  2008   lap choli  . COLONOSCOPY    . CRANIOPLASTY N/A 10/29/2018   Procedure: CRANIOPLASTY;  Surgeon: [name], MD;  Location: [facility];  Service: Neurosurgery;  Laterality: N/A;  CRANIOPLASTY  . CRANIOTOMY N/A 08/18/2018   Procedure: CRANIOTOMY TUMOR EXCISION;  Surgeon: [name], MD;  Location: [facility];  Service: Neurosurgery;  Laterality: N/A;  CRANIOTOMY TUMOR EXCISION  . NAILBED REPAIR Left 05/04/2014   Procedure: LEFT INDEX NAIL  ABLATION V-Y FLAP COVERAGE;  Surgeon: [name]., MD;  Location: [facility];  Service: Orthopedics;  Laterality: Left;  index  . PILONIDAL CYST EXCISION     x2  . TONSILLECTOMY    . VASECTOMY     Social History:  Social History   Socioeconomic History  . Marital status: Married    Spouse name: Not on file  . Number of children: Not on file  . Years of education: Not on file  . Highest education level: Not on file  Occupational  History  . Not on file  Tobacco Use  . Smoking status: Never Smoker  . Smokeless tobacco: Never Used  Vaping Use  . Vaping Use: Never used  Substance and Sexual Activity  . Alcohol use: Yes    Comment: occasionally  . Drug use: No  . Sexual activity: Not on file  Other Topics Concern  . Not on file  Social History Narrative  . Not on file   Social Determinants of Health   Financial Resource Strain: Not on file  Food Insecurity: Not on file  Transportation Needs: Not on file  Physical Activity: Not on file  Stress: Not on file  Social Connections: Not on file  Intimate Partner Violence: Not on file   Family History:  Family History  Problem Relation Age of Onset  . Diabetes Mother   . Skin cancer Brother   . Congenital heart disease Brother     Review of Systems: Constitutional: Denies fevers, chills or abnormal weight loss Eyes: Denies blurriness of vision Ears, nose, mouth, throat, and face: Denies mucositis or sore throat Respiratory: Denies cough, dyspnea or wheezes Cardiovascular: Denies palpitation, chest discomfort or lower extremity swelling Gastrointestinal:  Denies nausea, constipation, diarrhea GU: Denies dysuria or incontinence Skin: Denies abnormal skin rashes Neurological: Per HPI Musculoskeletal: Denies joint pain, back or neck discomfort. No decrease in ROM Behavioral/Psych: Denies anxiety, disturbance in thought content, and mood instability  Physical Exam: Vitals:   05/06/21 1230  BP: 131/65  Pulse: 66  Resp: 14  Temp: (!) 97.5 F (36.4 C)  SpO2: 100%   KPS: 90. General: Alert, cooperative, pleasant, in no acute distress Head: Craniotomy scar noted, dry and intact. EENT: No conjunctival injection or scleral icterus. Oral mucosa moist Lungs: Resp effort normal Cardiac: Regular rate and rhythm Abdomen: Soft, non-distended abdomen Skin: No rashes cyanosis or petechiae. Extremities: No clubbing or edema  Neurologic Exam: Mental Status:  Awake, alert, attentive to examiner. Oriented to self and environment. Language is fluent with intact comprehension.  Cranial Nerves: Visual acuity is grossly normal. Visual fields are full. Extra-ocular movements intact. No ptosis. Face is symmetric, tongue midline. Motor: Tone and bulk are normal. Power is full in both arms and legs. Reflexes are symmetric, no pathologic reflexes present. Intact finger to nose bilaterally Sensory: Intact to light touch and temperature Gait: Normal and tandem gait is normal.   Labs: I have reviewed the data as listed    Component Value Date/Time   NA 139 04/19/2021 1540   K 3.8 04/19/2021 1540   CL 104 04/19/2021 1540   CO2 24 04/19/2021 1540   GLUCOSE 130 (H) 04/19/2021 1540   BUN 17 04/19/2021 1540   CREATININE 0.88 04/19/2021 1540   CALCIUM 8.9 04/19/2021 1540   PROT 6.4 (L) 04/19/2021 1540   ALBUMIN 4.1 04/19/2021 1540   AST 13 (L) 04/19/2021 1540   ALT 13 04/19/2021 1540   ALKPHOS 78 04/19/2021 1540  BILITOT 0.3 04/19/2021 1540   GFRNONAA >60 04/19/2021 1540   GFRAA >60 11/26/2018 1231   Lab Results  Component Value Date   WBC 5.2 04/19/2021   NEUTROABS 2.9 04/19/2021   HGB 14.1 04/19/2021   HCT 42.4 04/19/2021   MCV 89.8 04/19/2021   PLT 184 04/19/2021    Imaging:  [facility] Clinician Interpretation: I have personally reviewed the CNS images as listed.  My interpretation, in the context of the patient's clinical presentation, is progressive disease  CT Head Wo Contrast  Result Date: 04/19/2021 CLINICAL DATA:  Seizure-like activity EXAM: CT HEAD WITHOUT CONTRAST TECHNIQUE: Contiguous axial images were obtained from the base of the skull through the vertex without intravenous contrast. COMPARISON:  09/20/2018, correlation made with MRI 01/04/2021 FINDINGS: Brain: There is no acute intracranial hemorrhage or significant mass effect. Encephalomalacia/gliosis within the left frontal lobe underlying craniotomy similar in extent to prior MRI.  Hyperdense residual tumor is present better evaluated on prior MRI. No new loss of gray-white differentiation. Additional patchy areas of hypoattenuation in the supratentorial white matter probably reflects similar chronic microvascular ischemic changes. No hydrocephalus. Vascular: No hyperdense vessel or unexpected calcification. Skull: Left frontal craniotomy with cranioplasty. Sinuses/Orbits: No acute finding. Other: None. IMPRESSION: No acute intracranial hemorrhage or evidence of acute infarction. Significant mass effect. Left frontal encephalomalacia/gliosis similar to prior MRI. Residual tumor is present and probably similar in extent does not well evaluated. Electronically Signed   By: Macy Mis M.D.   On: 04/19/2021 16:23   MR BRAIN W WO CONTRAST  Result Date: 05/03/2021 CLINICAL DATA:  Follow-up atypical meningioma status post resection and radiation therapy. Seizures. EXAM: MRI HEAD WITHOUT AND WITH CONTRAST TECHNIQUE: Multiplanar, multiecho pulse sequences of the brain and surrounding structures were obtained without and with intravenous contrast. CONTRAST:  105mL MULTIHANCE GADOBENATE DIMEGLUMINE 529 MG/ML IV SOLN COMPARISON:  Head CT venogram 05/03/2021.  Head MRI 01/04/2021. FINDINGS: Brain: There is no evidence of an acute infarct, midline shift, or extra-axial fluid collection. Mild cerebral atrophy is within normal limits for age. Patchy T2 hyperintensities in the cerebral white matter bilaterally are nonspecific but compatible with moderate chronic small vessel ischemic disease, stable to slightly progressed from the prior MRI. Postoperative changes are again seen at the left frontoparietal vertex related to resection of a meningioma. There are small amount of chronic blood products in this region. Residual meningioma at the vertex has mildly enlarged compared to the prior MRI, currently measuring 5.2 x 3.0 x 2.3 cm (AP x transverse x craniocaudal) (previously 4.8 x 2.5 x 2.2 cm when  measured in a similar fashion). Tumor is again noted to invade and completely fill/obliterate the superior sagittal sinus in this region with the sinus remaining patent more anteriorly and posteriorly. Moderate T2 hyperintensity in the adjacent left frontoparietal white matter has mildly increased. A separate 7 mm homogeneously enhancing dural-based mass more inferiorly over the left frontoparietal convexity has enlarged (series 16, image 117 without significant associated mass effect or brain edema. Vascular: Major intracranial arterial flow voids are preserved. Skull and upper cervical spine: Left frontoparietal vertex craniotomy. Sinuses/Orbits: Unremarkable orbits. Unchanged mucous retention cyst in the left maxillary sinus. Clear mastoid air cells. Other: None. IMPRESSION: 1. Mild enlargement of the residual meningioma at the left frontoparietal vertex with invasion of the superior sagittal sinus and slightly increased edema. 2. Mild enlargement of a 7 mm meningioma more inferiorly over the left lateral cerebral convexity. Electronically Signed   By: Seymour Bars.D.  On: 05/03/2021 15:44   CT VENOGRAM HEAD  Result Date: 05/03/2021 CLINICAL DATA:  Follow-up atypical meningioma status post resection and radiation therapy. EXAM: CT VENOGRAM HEAD TECHNIQUE: Multidetector CT imaging of the head was performed using the standard protocol during bolus administration of intravenous contrast. Multiplanar CT image reconstructions and MIPs were obtained to evaluate the vascular anatomy. CONTRAST:  47mL ISOVUE-370 IOPAMIDOL (ISOVUE-370) INJECTION 76% COMPARISON:  Noncontrast head CT 04/19/2021.  Head MRI 01/04/2021. FINDINGS: CT HEAD Brain: Postoperative changes are again seen from a left frontal vertex craniotomy. Residual tumor in this region is better evaluated on today's MRI. Low-density in the surrounding white matter is unchanged from last month's noncontrast head CT. No acute cortically based infarct,  intracranial hemorrhage, midline shift, or extra-axial fluid collection is identified. Scattered hypodensities elsewhere in the cerebral white matter bilaterally are unchanged from the prior head CT and are nonspecific but compatible with mild chronic small vessel ischemic disease. Mild cerebral atrophy is within normal limits for age. Vascular: Calcified atherosclerosis at the skull base. Skull: Left frontal craniotomy and cranioplasty. Sinuses: Visualized paranasal sinuses and mastoid air cells are clear. Other: Unremarkable orbits. CTV HEAD Residual meningioma in the vertex region invades and completely fills/obliterates the adjacent segment of the superior sagittal sinus. The superior sagittal sinus remains patent both anterior and posterior to the portion involved by tumor, however it is irregular and mildly attenuated and there may be a small amount of nonocclusive chronic thrombus in these areas. The internal cerebral veins, vein of Galen, straight sinus, transverse sinuses, sigmoid sinuses, and jugular bulbs are patent without evidence of thrombus or significant stenosis. Review of the MIP images confirms the above findings. IMPRESSION: Residual left frontoparietal vertex meningioma which invades and obliterates the adjacent superior sagittal sinus. The sinus remains patent more anteriorly and posteriorly though is irregular and mildly attenuated which could reflect some nonocclusive chronic thrombus. Electronically Signed   By: Logan Bores M.D.   On: 05/03/2021 15:50    Assessment/Plan 1. 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  MRI demonstrates continuation of previously visualized progression within posterior aspect of mass, adjacent to or invading the saggittal sinus.  These changes appear most consistent with progressive tumor refractory to radiation therapy.  CT venogram demonstrates occlusion of sagittal sinus at  level of the tumor.  We discussed his case in brain/spine tumor board this morning.  Recommendation was made for consideration of re-resection.  Radiosurgery is less appealing option given failure of RT at 55 Gy dose level.   He and his wife are agreeable with this plan, and understand risks include left leg weakness given proximity of infiltrating tumor to primary motor cortex.  We will reach out to Dr. Christella Noa to discuss plan for additional surgery.  In meantime, we are ok with continuing Keppra 500mg  BID.  It is unclear if his episodes of right leg numbness are epileptic in nature, but they are minimally disruptive to day-to-day functioning at this point.  All questions were answered. The patient knows to call the clinic with any problems, questions or concerns. No barriers to learning were detected.  The total time spent in the encounter was 40 minutes and more than 50% was on counseling and review of test results   [name], MD Medical Director of Neuro-Oncology [facility] at [place] 05/06/21 12:38 PM

## 2021-05-09 ENCOUNTER — Ambulatory Visit: Payer: Medicare Other | Admitting: Internal Medicine

## 2021-06-06 ENCOUNTER — Other Ambulatory Visit: Payer: Self-pay | Admitting: Neurosurgery

## 2021-06-07 ENCOUNTER — Other Ambulatory Visit: Payer: Self-pay | Admitting: Radiation Therapy

## 2021-06-11 ENCOUNTER — Encounter: Payer: Self-pay | Admitting: Internal Medicine

## 2021-06-17 NOTE — Progress Notes (Signed)
Surgical Instructions    Your procedure is scheduled on 06/21/21.  Report to [facility] Main Entrance "A" at 08:05 A.M., then check in with the Admitting office.  Call this number if you have problems the morning of surgery:  [phone]   If you have any questions prior to your surgery date call [phone]: Open Monday-Friday 8am-4pm    Remember:  Do not eat or drink after midnight the night before your surgery      Take these medicines the morning of surgery with A SIP OF WATER  levETIRAcetam (KEPPRA)   As of today, STOP taking any Aspirin (unless otherwise instructed by your surgeon) Aleve, Naproxen, Ibuprofen, Motrin, Advil, Goody's, BC's, all herbal medications, fish oil, and all vitamins.          Do not wear jewelry or makeup Do not wear lotions, powders, colognes, or deodorant. Men may shave face and neck. Do not bring valuables to the hospital. DO Not wear nail polish, gel polish, artificial nails, or any other type of covering on natural nails              including finger and toenails. If patients have artificial nails, gel coating, etc. that need to be removed by a nail salon please have this removed prior to surgery or surgery may need to be canceled/delayed if the surgeon/ anesthesia feels like the patient is unable to be adequately monitored.             [facility] is not responsible for any belongings or valuables.  Do NOT Smoke (Tobacco/Vaping) or drink Alcohol 24 hours prior to your procedure If you use a CPAP at night, you may bring all equipment for your overnight stay.   Contacts, glasses, dentures or bridgework may not be worn into surgery, please bring cases for these belongings   For patients admitted to the hospital, discharge time will be determined by your treatment team.   Patients discharged the day of surgery will not be allowed to drive home, and someone needs to stay with them for 24 hours.  ONLY 1 SUPPORT PERSON MAY BE PRESENT WHILE YOU ARE  IN SURGERY. IF YOU ARE TO BE ADMITTED ONCE YOU ARE IN YOUR ROOM YOU WILL BE ALLOWED TWO (2) VISITORS.  Minor children may have two parents present. Special consideration for safety and communication needs will be reviewed on a case by case basis.  Special instructions:    Oral Hygiene is also important to reduce your risk of infection.  Remember - BRUSH YOUR TEETH THE MORNING OF SURGERY WITH YOUR REGULAR TOOTHPASTE   [facility]- Preparing For Surgery  Before surgery, you can play an important role. Because skin is not sterile, your skin needs to be as free of germs as possible. You can reduce the number of germs on your skin by washing with CHG (chlorahexidine gluconate) Soap before surgery.  CHG is an antiseptic cleaner which kills germs and bonds with the skin to continue killing germs even after washing.     Please do not use if you have an allergy to CHG or antibacterial soaps. If your skin becomes reddened/irritated stop using the CHG.  Do not shave (including legs and underarms) for at least 48 hours prior to first CHG shower. It is OK to shave your face.  Please follow these instructions carefully.     Shower the NIGHT BEFORE SURGERY and the MORNING OF SURGERY with CHG Soap.   If you chose to wash your hair,  wash your hair first as usual with your normal shampoo. After you shampoo, rinse your hair and body thoroughly to remove the shampoo.  Then [employer] and genitals (private parts) with your normal soap and rinse thoroughly to remove soap.  After that Use CHG Soap as you would any other liquid soap. You can apply CHG directly to the skin and wash gently with a scrungie or a clean washcloth.   Apply the CHG Soap to your body ONLY FROM THE NECK DOWN.  Do not use on open wounds or open sores. Avoid contact with your eyes, ears, mouth and genitals (private parts). Wash Face and genitals (private parts)  with your normal soap.   Wash thoroughly, paying special attention to the area  where your surgery will be performed.  Thoroughly rinse your body with warm water from the neck down.  DO NOT shower/wash with your normal soap after using and rinsing off the CHG Soap.  Pat yourself dry with a CLEAN TOWEL.  Wear CLEAN PAJAMAS to bed the night before surgery  Place CLEAN SHEETS on your bed the night before your surgery  DO NOT SLEEP WITH PETS.   Day of Surgery: Take a shower with CHG soap. Wear Clean/Comfortable clothing the morning of surgery Do not apply any deodorants/lotions.   Remember to brush your teeth WITH YOUR REGULAR TOOTHPASTE.   Please read over the following fact sheets that you were given.

## 2021-06-18 ENCOUNTER — Encounter (HOSPITAL_COMMUNITY): Payer: Self-pay

## 2021-06-18 ENCOUNTER — Other Ambulatory Visit: Payer: Self-pay

## 2021-06-18 ENCOUNTER — Encounter (HOSPITAL_COMMUNITY)
Admission: RE | Admit: 2021-06-18 | Discharge: 2021-06-18 | Disposition: A | Discharge status: Home / Self Care | Payer: Medicare Other | Source: Ambulatory Visit | Attending: Neurosurgery | Admitting: Neurosurgery

## 2021-06-18 DIAGNOSIS — Z01812 Encounter for preprocedural laboratory examination: Secondary | ICD-10-CM | POA: Insufficient documentation

## 2021-06-18 DIAGNOSIS — Z20822 Contact with and (suspected) exposure to covid-19: Secondary | ICD-10-CM | POA: Insufficient documentation

## 2021-06-18 LAB — BASIC METABOLIC PANEL
Anion gap: 11 (ref 5–15)
BUN: 15 mg/dL (ref 8–23)
CO2: 29 mmol/L (ref 22–32)
Calcium: 9.3 mg/dL (ref 8.9–10.3)
Chloride: 100 mmol/L (ref 98–111)
Creatinine, Ser: 0.83 mg/dL (ref 0.61–1.24)
GFR, Estimated: >60 mL/min (ref >60)
Glucose, Bld: 103 mg/dL — ABNORMAL HIGH (ref 70–99)
Potassium: 4.2 mmol/L (ref 3.5–5.1)
Sodium: 140 mmol/L (ref 135–145)

## 2021-06-18 LAB — CBC
HCT: 45.9 % (ref 39.0–52.0)
Hemoglobin: 15 g/dL (ref 13.0–17.0)
MCH: 30.5 pg (ref 26.0–34.0)
MCHC: 32.7 g/dL (ref 30.0–36.0)
MCV: 93.3 fL (ref 80.0–100.0)
Platelets: 164 10*3/uL (ref 150–400)
RBC: 4.92 MIL/uL (ref 4.22–5.81)
RDW: 12.7 % (ref 11.5–15.5)
WBC: 5.7 10*3/uL (ref 4.0–10.5)
nRBC: 0 % (ref 0.0–0.2)

## 2021-06-18 LAB — SARS CORONAVIRUS 2 (TAT 6-24 HRS): SARS Coronavirus 2: NEGATIVE

## 2021-06-18 NOTE — Progress Notes (Signed)
   PCP - Dr. Kathryne Eriksson Cardiologist - Denies  Chest x-ray - Not indicated EKG - 04/19/21 Stress Test - Denies ECHO - Denies Cardiac Cath - Denies  No OSA  DM - Denies  COVID TEST- 06/18/21   Anesthesia review: No  Patient denies shortness of breath, fever, cough and chest pain at PAT appointment   All instructions explained to the patient, with a verbal understanding of the material. Patient agrees to go over the instructions while at home for a better understanding. Patient also instructed to wear a mask while in public after being tested for COVID-19. The opportunity to ask questions was provided.

## 2021-06-20 ENCOUNTER — Encounter (INDEPENDENT_AMBULATORY_CARE_PROVIDER_SITE_OTHER): Payer: Medicare Other | Admitting: Ophthalmology

## 2021-06-21 ENCOUNTER — Other Ambulatory Visit: Payer: Self-pay

## 2021-06-21 ENCOUNTER — Inpatient Hospital Stay (HOSPITAL_COMMUNITY): Payer: Medicare Other | Admitting: Anesthesiology

## 2021-06-21 ENCOUNTER — Encounter (HOSPITAL_COMMUNITY)
Admission: RE | Disposition: A | Discharge status: Rehabilitation Facility | Payer: Self-pay | Source: Home / Self Care | Attending: Neurosurgery

## 2021-06-21 ENCOUNTER — Inpatient Hospital Stay (HOSPITAL_COMMUNITY)
Admission: RE | Admit: 2021-06-21 | Discharge: 2021-06-26 | DRG: 026 | Disposition: A | Discharge status: Rehabilitation Facility | Payer: Medicare Other | Attending: Neurosurgery | Admitting: Neurosurgery

## 2021-06-21 ENCOUNTER — Encounter (HOSPITAL_COMMUNITY): Payer: Self-pay | Admitting: Neurosurgery

## 2021-06-21 DIAGNOSIS — I69320 Aphasia following cerebral infarction: Secondary | ICD-10-CM

## 2021-06-21 DIAGNOSIS — R350 Frequency of micturition: Secondary | ICD-10-CM | POA: Diagnosis present

## 2021-06-21 DIAGNOSIS — H3530 Unspecified macular degeneration: Secondary | ICD-10-CM | POA: Diagnosis present

## 2021-06-21 DIAGNOSIS — Z923 Personal history of irradiation: Secondary | ICD-10-CM

## 2021-06-21 DIAGNOSIS — Z20822 Contact with and (suspected) exposure to covid-19: Secondary | ICD-10-CM | POA: Diagnosis present

## 2021-06-21 DIAGNOSIS — K219 Gastro-esophageal reflux disease without esophagitis: Secondary | ICD-10-CM | POA: Diagnosis present

## 2021-06-21 DIAGNOSIS — I69351 Hemiplegia and hemiparesis following cerebral infarction affecting right dominant side: Secondary | ICD-10-CM

## 2021-06-21 DIAGNOSIS — R339 Retention of urine, unspecified: Secondary | ICD-10-CM | POA: Diagnosis present

## 2021-06-21 DIAGNOSIS — D420 Neoplasm of uncertain behavior of cerebral meninges: Secondary | ICD-10-CM | POA: Diagnosis present

## 2021-06-21 DIAGNOSIS — D62 Acute posthemorrhagic anemia: Secondary | ICD-10-CM | POA: Diagnosis present

## 2021-06-21 DIAGNOSIS — Z9889 Other specified postprocedural states: Secondary | ICD-10-CM | POA: Diagnosis not present

## 2021-06-21 DIAGNOSIS — N319 Neuromuscular dysfunction of bladder, unspecified: Secondary | ICD-10-CM | POA: Diagnosis present

## 2021-06-21 DIAGNOSIS — D320 Benign neoplasm of cerebral meninges: Principal | ICD-10-CM | POA: Diagnosis present

## 2021-06-21 DIAGNOSIS — R5381 Other malaise: Secondary | ICD-10-CM | POA: Diagnosis present

## 2021-06-21 DIAGNOSIS — Z833 Family history of diabetes mellitus: Secondary | ICD-10-CM | POA: Diagnosis not present

## 2021-06-21 DIAGNOSIS — D696 Thrombocytopenia, unspecified: Secondary | ICD-10-CM | POA: Diagnosis present

## 2021-06-21 DIAGNOSIS — K5901 Slow transit constipation: Secondary | ICD-10-CM | POA: Diagnosis not present

## 2021-06-21 DIAGNOSIS — Z9049 Acquired absence of other specified parts of digestive tract: Secondary | ICD-10-CM | POA: Diagnosis not present

## 2021-06-21 DIAGNOSIS — I69398 Other sequelae of cerebral infarction: Secondary | ICD-10-CM | POA: Diagnosis not present

## 2021-06-21 DIAGNOSIS — Z86018 Personal history of other benign neoplasm: Secondary | ICD-10-CM | POA: Diagnosis not present

## 2021-06-21 DIAGNOSIS — R252 Cramp and spasm: Secondary | ICD-10-CM | POA: Diagnosis not present

## 2021-06-21 DIAGNOSIS — K592 Neurogenic bowel, not elsewhere classified: Secondary | ICD-10-CM | POA: Diagnosis present

## 2021-06-21 DIAGNOSIS — Z808 Family history of malignant neoplasm of other organs or systems: Secondary | ICD-10-CM | POA: Diagnosis not present

## 2021-06-21 DIAGNOSIS — M25561 Pain in right knee: Secondary | ICD-10-CM | POA: Diagnosis present

## 2021-06-21 DIAGNOSIS — K5900 Constipation, unspecified: Secondary | ICD-10-CM | POA: Diagnosis present

## 2021-06-21 DIAGNOSIS — R739 Hyperglycemia, unspecified: Secondary | ICD-10-CM | POA: Diagnosis present

## 2021-06-21 DIAGNOSIS — T380X5A Adverse effect of glucocorticoids and synthetic analogues, initial encounter: Secondary | ICD-10-CM | POA: Diagnosis present

## 2021-06-21 DIAGNOSIS — I495 Sick sinus syndrome: Secondary | ICD-10-CM | POA: Diagnosis present

## 2021-06-21 HISTORY — PX: APPLICATION OF CRANIAL NAVIGATION: SHX6578

## 2021-06-21 HISTORY — PX: CRANIOTOMY: SHX93

## 2021-06-21 LAB — POCT I-STAT 7, (LYTES, BLD GAS, ICA,H+H)
Acid-base deficit: 1 mmol/L (ref 0.0–2.0)
Bicarbonate: 24.3 mmol/L (ref 20.0–28.0)
Calcium, Ion: 1.21 mmol/L (ref 1.15–1.40)
HCT: 34 % — ABNORMAL LOW (ref 39.0–52.0)
Hemoglobin: 11.6 g/dL — ABNORMAL LOW (ref 13.0–17.0)
O2 Saturation: 100 %
Patient temperature: 34.9
Potassium: 4.2 mmol/L (ref 3.5–5.1)
Sodium: 139 mmol/L (ref 135–145)
TCO2: 26 mmol/L (ref 22–32)
pCO2 arterial: 40.1 mmHg (ref 32.0–48.0)
pH, Arterial: 7.38 (ref 7.350–7.450)
pO2, Arterial: 231 mmHg — ABNORMAL HIGH (ref 83.0–108.0)

## 2021-06-21 LAB — CBC
HCT: 36 % — ABNORMAL LOW (ref 39.0–52.0)
Hemoglobin: 11.9 g/dL — ABNORMAL LOW (ref 13.0–17.0)
MCH: 30.1 pg (ref 26.0–34.0)
MCHC: 33.1 g/dL (ref 30.0–36.0)
MCV: 90.9 fL (ref 80.0–100.0)
Platelets: 132 10*3/uL — ABNORMAL LOW (ref 150–400)
RBC: 3.96 MIL/uL — ABNORMAL LOW (ref 4.22–5.81)
RDW: 12.4 % (ref 11.5–15.5)
WBC: 8 10*3/uL (ref 4.0–10.5)
nRBC: 0 % (ref 0.0–0.2)

## 2021-06-21 LAB — CREATININE, SERUM
Creatinine, Ser: 0.78 mg/dL (ref 0.61–1.24)
GFR, Estimated: >60 mL/min (ref >60)

## 2021-06-21 LAB — POCT ACTIVATED CLOTTING TIME: Activated Clotting Time: 0 seconds

## 2021-06-21 LAB — PREPARE RBC (CROSSMATCH)

## 2021-06-21 LAB — MRSA NEXT GEN BY PCR, NASAL: MRSA by PCR Next Gen: NOT DETECTED

## 2021-06-21 SURGERY — CRANIOTOMY TUMOR EXCISION
Anesthesia: General

## 2021-06-21 MED ORDER — OXYCODONE HCL 5 MG PO TABS: 5.0000 mg | ORAL_TABLET | Freq: Once | ORAL | Status: DC | PRN

## 2021-06-21 MED ORDER — PHENYLEPHRINE 40 MCG/ML (10ML) SYRINGE FOR IV PUSH (FOR BLOOD PRESSURE SUPPORT)
PREFILLED_SYRINGE | INTRAVENOUS | Status: AC
  Filled 2021-06-21: qty 10

## 2021-06-21 MED ORDER — POTASSIUM CHLORIDE IN NACL 20-0.9 MEQ/L-% IV SOLN
INTRAVENOUS | Status: DC
  Filled 2021-06-21 (×3): qty 1000

## 2021-06-21 MED ORDER — BACITRACIN ZINC 500 UNIT/GM EX OINT
TOPICAL_OINTMENT | CUTANEOUS | Status: DC | PRN
  Administered 2021-06-21: 1 via TOPICAL

## 2021-06-21 MED ORDER — NALOXONE HCL 0.4 MG/ML IJ SOLN: 0.0800 mg | INTRAMUSCULAR | Status: DC | PRN

## 2021-06-21 MED ORDER — ACETAMINOPHEN 500 MG PO TABS: 1000.0000 mg | ORAL_TABLET | Freq: Once | ORAL | Status: DC | PRN

## 2021-06-21 MED ORDER — LIDOCAINE-EPINEPHRINE 1 %-1:100000 IJ SOLN
INTRAMUSCULAR | Status: AC
  Filled 2021-06-21: qty 1

## 2021-06-21 MED ORDER — SUGAMMADEX SODIUM 200 MG/2ML IV SOLN
INTRAVENOUS | Status: DC | PRN
  Administered 2021-06-21: 200 mg via INTRAVENOUS

## 2021-06-21 MED ORDER — LEVETIRACETAM 500 MG PO TABS
500.0000 mg | ORAL_TABLET | Freq: Two times a day (BID) | ORAL | Status: DC
  Administered 2021-06-21 – 2021-06-26 (×10): 500 mg via ORAL
  Filled 2021-06-21 (×10): qty 1

## 2021-06-21 MED ORDER — ACETAMINOPHEN 650 MG RE SUPP: 650.0000 mg | RECTAL | Status: DC | PRN

## 2021-06-21 MED ORDER — PANTOPRAZOLE SODIUM 40 MG IV SOLR
40.0000 mg | Freq: Every day | INTRAVENOUS | Status: DC
  Administered 2021-06-21: 40 mg via INTRAVENOUS
  Filled 2021-06-21: qty 40

## 2021-06-21 MED ORDER — BACITRACIN ZINC 500 UNIT/GM EX OINT
TOPICAL_OINTMENT | CUTANEOUS | Status: AC
  Filled 2021-06-21: qty 28.35

## 2021-06-21 MED ORDER — FENTANYL CITRATE (PF) 100 MCG/2ML IJ SOLN
INTRAMUSCULAR | Status: DC | PRN
  Administered 2021-06-21 (×2): 50 ug via INTRAVENOUS

## 2021-06-21 MED ORDER — ONDANSETRON HCL 4 MG/2ML IJ SOLN: 4.0000 mg | INTRAMUSCULAR | Status: DC | PRN

## 2021-06-21 MED ORDER — SODIUM CHLORIDE 0.9 % IV SOLN
INTRAVENOUS | Status: DC | PRN
  Administered 2021-06-21: 250 mL via INTRAVENOUS

## 2021-06-21 MED ORDER — MAGNESIUM CITRATE PO SOLN: 1.0000 | Freq: Once | ORAL | Status: DC | PRN

## 2021-06-21 MED ORDER — DEXAMETHASONE SODIUM PHOSPHATE 10 MG/ML IJ SOLN
INTRAMUSCULAR | Status: DC | PRN
  Administered 2021-06-21: 10 mg via INTRAVENOUS

## 2021-06-21 MED ORDER — GLYCOPYRROLATE PF 0.2 MG/ML IJ SOSY
PREFILLED_SYRINGE | INTRAMUSCULAR | Status: DC | PRN
  Administered 2021-06-21: .2 mg via INTRAVENOUS

## 2021-06-21 MED ORDER — DOCUSATE SODIUM 100 MG PO CAPS
100.0000 mg | ORAL_CAPSULE | Freq: Two times a day (BID) | ORAL | Status: DC
  Administered 2021-06-21 – 2021-06-26 (×7): 100 mg via ORAL
  Filled 2021-06-21 (×8): qty 1

## 2021-06-21 MED ORDER — ROCURONIUM BROMIDE 10 MG/ML (PF) SYRINGE
PREFILLED_SYRINGE | INTRAVENOUS | Status: AC
  Filled 2021-06-21: qty 10

## 2021-06-21 MED ORDER — LACTATED RINGERS IV SOLN: INTRAVENOUS | Status: DC

## 2021-06-21 MED ORDER — 0.9 % SODIUM CHLORIDE (POUR BTL) OPTIME
TOPICAL | Status: DC | PRN
  Administered 2021-06-21 (×3): 1000 mL

## 2021-06-21 MED ORDER — THROMBIN 20000 UNITS EX SOLR
CUTANEOUS | Status: AC
  Filled 2021-06-21: qty 20000

## 2021-06-21 MED ORDER — CHLORHEXIDINE GLUCONATE 0.12 % MT SOLN
15.0000 mL | Freq: Once | OROMUCOSAL | Status: AC
  Administered 2021-06-21: 15 mL via OROMUCOSAL
  Filled 2021-06-21: qty 15

## 2021-06-21 MED ORDER — EPHEDRINE 5 MG/ML INJ
INTRAVENOUS | Status: AC
  Filled 2021-06-21: qty 10

## 2021-06-21 MED ORDER — ACETAMINOPHEN 325 MG PO TABS
650.0000 mg | ORAL_TABLET | ORAL | Status: DC | PRN
  Administered 2021-06-22 (×2): 650 mg via ORAL
  Filled 2021-06-21 (×2): qty 2

## 2021-06-21 MED ORDER — PRESERVISION AREDS 2 PO CAPS: 1.0000 | ORAL_CAPSULE | Freq: Two times a day (BID) | ORAL | Status: DC

## 2021-06-21 MED ORDER — PROPOFOL 10 MG/ML IV BOLUS
INTRAVENOUS | Status: DC | PRN
  Administered 2021-06-21: 100 mg via INTRAVENOUS
  Administered 2021-06-21 (×2): 30 mg via INTRAVENOUS

## 2021-06-21 MED ORDER — LABETALOL HCL 5 MG/ML IV SOLN
10.0000 mg | INTRAVENOUS | Status: DC | PRN
  Administered 2021-06-21: 10 mg via INTRAVENOUS
  Administered 2021-06-21: 20 mg via INTRAVENOUS
  Filled 2021-06-21 (×2): qty 4

## 2021-06-21 MED ORDER — CEFAZOLIN SODIUM-DEXTROSE 2-4 GM/100ML-% IV SOLN
2.0000 g | INTRAVENOUS | Status: AC
  Administered 2021-06-21: 2 g via INTRAVENOUS
  Filled 2021-06-21: qty 100

## 2021-06-21 MED ORDER — DEXAMETHASONE 4 MG PO TABS
4.0000 mg | ORAL_TABLET | Freq: Four times a day (QID) | ORAL | Status: DC
  Administered 2021-06-21 – 2021-06-24 (×11): 4 mg via ORAL
  Filled 2021-06-21 (×11): qty 1

## 2021-06-21 MED ORDER — ORAL CARE MOUTH RINSE: 15.0000 mL | Freq: Once | OROMUCOSAL | Status: AC

## 2021-06-21 MED ORDER — ONDANSETRON HCL 4 MG PO TABS: 4.0000 mg | ORAL_TABLET | ORAL | Status: DC | PRN

## 2021-06-21 MED ORDER — EPHEDRINE SULFATE-NACL 50-0.9 MG/10ML-% IV SOSY
PREFILLED_SYRINGE | INTRAVENOUS | Status: DC | PRN
  Administered 2021-06-21 (×2): 5 mg via INTRAVENOUS

## 2021-06-21 MED ORDER — MORPHINE SULFATE (PF) 2 MG/ML IV SOLN: 1.0000 mg | INTRAVENOUS | Status: DC | PRN

## 2021-06-21 MED ORDER — ONDANSETRON HCL 4 MG/2ML IJ SOLN
INTRAMUSCULAR | Status: AC
  Filled 2021-06-21: qty 2

## 2021-06-21 MED ORDER — LIDOCAINE 2% (20 MG/ML) 5 ML SYRINGE
INTRAMUSCULAR | Status: DC | PRN
  Administered 2021-06-21: 60 mg via INTRAVENOUS

## 2021-06-21 MED ORDER — HYDROCODONE-ACETAMINOPHEN 5-325 MG PO TABS
1.0000 | ORAL_TABLET | ORAL | Status: DC | PRN
  Filled 2021-06-21: qty 1

## 2021-06-21 MED ORDER — PHENYLEPHRINE HCL-NACL 10-0.9 MG/250ML-% IV SOLN
INTRAVENOUS | Status: DC | PRN
  Administered 2021-06-21: 25 ug/min via INTRAVENOUS

## 2021-06-21 MED ORDER — CHLORHEXIDINE GLUCONATE CLOTH 2 % EX PADS: 6.0000 | MEDICATED_PAD | Freq: Once | CUTANEOUS | Status: DC

## 2021-06-21 MED ORDER — ALBUMIN HUMAN 25 % IV SOLN: INTRAVENOUS | Status: DC | PRN

## 2021-06-21 MED ORDER — PROPOFOL 10 MG/ML IV BOLUS
INTRAVENOUS | Status: AC
  Filled 2021-06-21: qty 20

## 2021-06-21 MED ORDER — ONDANSETRON HCL 4 MG/2ML IJ SOLN
INTRAMUSCULAR | Status: DC | PRN
  Administered 2021-06-21: 4 mg via INTRAVENOUS

## 2021-06-21 MED ORDER — MICROFIBRILLAR COLL HEMOSTAT EX PADS
MEDICATED_PAD | CUTANEOUS | Status: DC | PRN
  Administered 2021-06-21: 1 via TOPICAL

## 2021-06-21 MED ORDER — MANNITOL 25 % IV SOLN
INTRAVENOUS | Status: DC | PRN
  Administered 2021-06-21: 37.5 g via INTRAVENOUS

## 2021-06-21 MED ORDER — SODIUM CHLORIDE 0.9 % IV SOLN: INTRAVENOUS | Status: DC | PRN

## 2021-06-21 MED ORDER — FENTANYL CITRATE (PF) 250 MCG/5ML IJ SOLN
INTRAMUSCULAR | Status: AC
  Filled 2021-06-21: qty 5

## 2021-06-21 MED ORDER — THROMBIN 5000 UNITS EX SOLR
CUTANEOUS | Status: AC
  Filled 2021-06-21: qty 5000

## 2021-06-21 MED ORDER — FENTANYL CITRATE (PF) 100 MCG/2ML IJ SOLN: 25.0000 ug | INTRAMUSCULAR | Status: DC | PRN

## 2021-06-21 MED ORDER — BISACODYL 5 MG PO TBEC
5.0000 mg | DELAYED_RELEASE_TABLET | Freq: Every day | ORAL | Status: DC | PRN
  Administered 2021-06-24: 5 mg via ORAL
  Filled 2021-06-21: qty 1

## 2021-06-21 MED ORDER — THROMBIN 20000 UNITS EX SOLR
CUTANEOUS | Status: DC | PRN
  Administered 2021-06-21: 20 mL via TOPICAL

## 2021-06-21 MED ORDER — LEVETIRACETAM IN NACL 500 MG/100ML IV SOLN
500.0000 mg | Freq: Two times a day (BID) | INTRAVENOUS | Status: DC
  Filled 2021-06-21 (×4): qty 100

## 2021-06-21 MED ORDER — OCUVITE-LUTEIN PO CAPS
1.0000 | ORAL_CAPSULE | Freq: Every day | ORAL | Status: DC
  Filled 2021-06-21: qty 1

## 2021-06-21 MED ORDER — CHLORHEXIDINE GLUCONATE CLOTH 2 % EX PADS
6.0000 | MEDICATED_PAD | Freq: Every day | CUTANEOUS | Status: DC
  Administered 2021-06-21 – 2021-06-26 (×6): 6 via TOPICAL

## 2021-06-21 MED ORDER — DEXAMETHASONE SODIUM PHOSPHATE 10 MG/ML IJ SOLN
INTRAMUSCULAR | Status: AC
  Filled 2021-06-21: qty 1

## 2021-06-21 MED ORDER — OXYCODONE HCL 5 MG/5ML PO SOLN: 5.0000 mg | Freq: Once | ORAL | Status: DC | PRN

## 2021-06-21 MED ORDER — SODIUM CHLORIDE 0.9 % IV SOLN: INTRAVENOUS | Status: DC

## 2021-06-21 MED ORDER — ACETAMINOPHEN 160 MG/5ML PO SOLN: 1000.0000 mg | Freq: Once | ORAL | Status: DC | PRN

## 2021-06-21 MED ORDER — LIDOCAINE 2% (20 MG/ML) 5 ML SYRINGE
INTRAMUSCULAR | Status: AC
  Filled 2021-06-21: qty 5

## 2021-06-21 MED ORDER — ACETAMINOPHEN 10 MG/ML IV SOLN: 1000.0000 mg | Freq: Once | INTRAVENOUS | Status: DC | PRN

## 2021-06-21 MED ORDER — SENNOSIDES-DOCUSATE SODIUM 8.6-50 MG PO TABS: 1.0000 | ORAL_TABLET | Freq: Every evening | ORAL | Status: DC | PRN

## 2021-06-21 MED ORDER — LEVETIRACETAM IN NACL 500 MG/100ML IV SOLN: 500.0000 mg | Freq: Two times a day (BID) | INTRAVENOUS | Status: DC

## 2021-06-21 MED ORDER — SODIUM CHLORIDE 0.9 % IV SOLN
0.0500 ug/kg/min | INTRAVENOUS | Status: DC
  Administered 2021-06-21 (×2): .2 ug/kg/min via INTRAVENOUS
  Filled 2021-06-21 (×3): qty 5000

## 2021-06-21 MED ORDER — LEVETIRACETAM 500 MG PO TABS: 500.0000 mg | ORAL_TABLET | Freq: Two times a day (BID) | ORAL | Status: DC

## 2021-06-21 MED ORDER — NITROGLYCERIN IN D5W 200-5 MCG/ML-% IV SOLN
INTRAVENOUS | Status: AC
  Filled 2021-06-21: qty 250

## 2021-06-21 MED ORDER — HEPARIN SODIUM (PORCINE) 5000 UNIT/ML IJ SOLN
5000.0000 [IU] | Freq: Three times a day (TID) | INTRAMUSCULAR | Status: DC
  Administered 2021-06-21 – 2021-06-26 (×15): 5000 [IU] via SUBCUTANEOUS
  Filled 2021-06-21 (×16): qty 1

## 2021-06-21 MED ORDER — LEVETIRACETAM IN NACL 500 MG/100ML IV SOLN
500.0000 mg | Freq: Once | INTRAVENOUS | Status: AC
  Administered 2021-06-21: 500 mg via INTRAVENOUS
  Filled 2021-06-21 (×2): qty 100

## 2021-06-21 MED ORDER — ROCURONIUM BROMIDE 10 MG/ML (PF) SYRINGE
PREFILLED_SYRINGE | INTRAVENOUS | Status: DC | PRN
  Administered 2021-06-21: 20 mg via INTRAVENOUS
  Administered 2021-06-21: 100 mg via INTRAVENOUS
  Administered 2021-06-21: 30 mg via INTRAVENOUS
  Administered 2021-06-21: 20 mg via INTRAVENOUS
  Administered 2021-06-21: 30 mg via INTRAVENOUS

## 2021-06-21 MED ORDER — PROMETHAZINE HCL 12.5 MG PO TABS
12.5000 mg | ORAL_TABLET | ORAL | Status: DC | PRN
  Filled 2021-06-21: qty 2

## 2021-06-21 MED ORDER — PROSIGHT PO TABS
1.0000 | ORAL_TABLET | Freq: Every day | ORAL | Status: DC
  Administered 2021-06-21 – 2021-06-26 (×6): 1 via ORAL
  Filled 2021-06-21 (×6): qty 1

## 2021-06-21 MED ORDER — LIDOCAINE-EPINEPHRINE 1 %-1:100000 IJ SOLN
INTRAMUSCULAR | Status: DC | PRN
  Administered 2021-06-21: 5 mL

## 2021-06-21 SURGICAL SUPPLY — 94 items
APL SKNCLS STERI-STRIP NONHPOA (GAUZE/BANDAGES/DRESSINGS)
BAG COUNTER SPONGE SURGICOUNT (BAG) ×3 IMPLANT
BAG SPNG CNTER NS LX DISP (BAG) ×2
BAG SURGICOUNT SPONGE COUNTING (BAG) ×1
BAND INSRT 18 STRL LF DISP RB (MISCELLANEOUS) ×4
BAND RUBBER #18 3X1/16 STRL (MISCELLANEOUS) ×8 IMPLANT
BENZOIN TINCTURE PRP APPL 2/3 (GAUZE/BANDAGES/DRESSINGS) IMPLANT
BLADE 15 SAFETY STRL DISP (BLADE) ×4 IMPLANT
BLADE CLIPPER SURG (BLADE) ×4 IMPLANT
BLADE SAW GIGLI 16 STRL (MISCELLANEOUS) IMPLANT
BLADE SURG 15 STRL LF DISP TIS (BLADE) IMPLANT
BLADE SURG 15 STRL SS (BLADE)
BLADE ULTRA TIP 2M (BLADE) IMPLANT
BNDG GAUZE ELAST 4 BULKY (GAUZE/BANDAGES/DRESSINGS) IMPLANT
BUR ACORN 6.0 PRECISION (BURR) ×3 IMPLANT
BUR ACORN 6.0MM PRECISION (BURR) ×1
BUR MATCHSTICK NEURO 3.0 LAGG (BURR) IMPLANT
BUR SPIRAL ROUTER 2.3 (BUR) ×3 IMPLANT
BUR SPIRAL ROUTER 2.3MM (BUR) ×1
CANISTER SUCT 3000ML PPV (MISCELLANEOUS) ×4 IMPLANT
CARTRIDGE OIL MAESTRO DRILL (MISCELLANEOUS) ×2 IMPLANT
CATH VENTRIC 35X38 W/TROCAR LG (CATHETERS) IMPLANT
CLIP VESOCCLUDE MED 6/CT (CLIP) ×8 IMPLANT
CNTNR URN SCR LID CUP LEK RST (MISCELLANEOUS) ×2 IMPLANT
CONT SPEC 4OZ STRL OR WHT (MISCELLANEOUS) ×4
DECANTER SPIKE VIAL GLASS SM (MISCELLANEOUS) ×4 IMPLANT
DIFFUSER DRILL AIR PNEUMATIC (MISCELLANEOUS) ×4 IMPLANT
DRAIN SUBARACHNOID (WOUND CARE) IMPLANT
DRAPE CAMERA VIDEO/LASER (DRAPES) IMPLANT
DRAPE MICROSCOPE LEICA (MISCELLANEOUS) ×4 IMPLANT
DRAPE NEUROLOGICAL W/INCISE (DRAPES) ×4 IMPLANT
DRAPE ORTHO SPLIT 77X108 STRL (DRAPES)
DRAPE SURG 17X23 STRL (DRAPES) IMPLANT
DRAPE SURG ORHT 6 SPLT 77X108 (DRAPES) IMPLANT
DRAPE WARM FLUID 44X44 (DRAPES) ×4 IMPLANT
DRSG TELFA 3X8 NADH (GAUZE/BANDAGES/DRESSINGS) ×8 IMPLANT
DURAPREP 6ML APPLICATOR 50/CS (WOUND CARE) ×4 IMPLANT
ELECT REM PT RETURN 9FT ADLT (ELECTROSURGICAL) ×4
ELECTRODE REM PT RTRN 9FT ADLT (ELECTROSURGICAL) ×2 IMPLANT
EVACUATOR 1/8 PVC DRAIN (DRAIN) IMPLANT
EVACUATOR SILICONE 100CC (DRAIN) IMPLANT
FORCEPS BIPOLAR SPETZLER 8 1.0 (NEUROSURGERY SUPPLIES) ×4 IMPLANT
GAUZE 4X4 16PLY ~~LOC~~+RFID DBL (SPONGE) IMPLANT
GAUZE SPONGE 4X4 12PLY STRL (GAUZE/BANDAGES/DRESSINGS) ×4 IMPLANT
GLOVE EXAM NITRILE XL STR (GLOVE) IMPLANT
GLOVE SURG LTX SZ6.5 (GLOVE) ×8 IMPLANT
GLOVE SURG POLYISO LF SZ7 (GLOVE) ×4 IMPLANT
GLOVE SURG UNDER POLY LF SZ7 (GLOVE) ×12 IMPLANT
GLOVE SURG UNDER POLY LF SZ7.5 (GLOVE) ×8 IMPLANT
GOWN STRL REUS W/ TWL LRG LVL3 (GOWN DISPOSABLE) ×6 IMPLANT
GOWN STRL REUS W/ TWL XL LVL3 (GOWN DISPOSABLE) IMPLANT
GOWN STRL REUS W/TWL 2XL LVL3 (GOWN DISPOSABLE) IMPLANT
GOWN STRL REUS W/TWL LRG LVL3 (GOWN DISPOSABLE) ×12
GOWN STRL REUS W/TWL XL LVL3 (GOWN DISPOSABLE)
GRAFT DURAGEN MATRIX 3WX3L (Graft) ×4 IMPLANT
GRAFT DURAGEN MATRIX 3X3 SNGL (Graft) ×2 IMPLANT
HEMOSTAT SURGICEL 2X14 (HEMOSTASIS) ×8 IMPLANT
HOOK DURA 1/2IN (MISCELLANEOUS) ×4 IMPLANT
KIT BASIN OR (CUSTOM PROCEDURE TRAY) ×4 IMPLANT
KIT DRAIN CSF ACCUDRAIN (MISCELLANEOUS) IMPLANT
KIT TURNOVER KIT B (KITS) ×4 IMPLANT
MARKER SPHERE PSV REFLC 13MM (MARKER) ×8 IMPLANT
NEEDLE HYPO 25X1 1.5 SAFETY (NEEDLE) ×4 IMPLANT
NEEDLE SPNL 18GX3.5 QUINCKE PK (NEEDLE) IMPLANT
NS IRRIG 1000ML POUR BTL (IV SOLUTION) ×12 IMPLANT
OIL CARTRIDGE MAESTRO DRILL (MISCELLANEOUS) ×4
PACK CRANIOTOMY CUSTOM (CUSTOM PROCEDURE TRAY) ×4 IMPLANT
PATTIES SURGICAL .25X.25 (GAUZE/BANDAGES/DRESSINGS) IMPLANT
PATTIES SURGICAL .5 X.5 (GAUZE/BANDAGES/DRESSINGS) IMPLANT
PATTIES SURGICAL .5 X3 (DISPOSABLE) ×4 IMPLANT
PATTIES SURGICAL 1/4 X 3 (GAUZE/BANDAGES/DRESSINGS) IMPLANT
PATTIES SURGICAL 1X1 (DISPOSABLE) ×8 IMPLANT
PLATE CRANIAL 12 2H RIGID UNI (Plate) ×12 IMPLANT
PLATE CRANIAL HX4 UNI NEURO 3 (Plate) ×4 IMPLANT
SCREW UNIII AXS SD 1.5X4 (Screw) ×48 IMPLANT
SPECIMEN JAR SMALL (MISCELLANEOUS) IMPLANT
SPONGE NEURO XRAY DETECT 1X3 (DISPOSABLE) ×4 IMPLANT
SPONGE SURGIFOAM ABS GEL 100 (HEMOSTASIS) ×4 IMPLANT
STAPLER VISISTAT 35W (STAPLE) ×4 IMPLANT
SUT ETHILON 3 0 FSL (SUTURE) IMPLANT
SUT ETHILON 3 0 PS 1 (SUTURE) IMPLANT
SUT NURALON 4 0 TR CR/8 (SUTURE) ×8 IMPLANT
SUT SILK 0 FSL (SUTURE) ×12 IMPLANT
SUT SILK 0 TIES 10X30 (SUTURE) IMPLANT
SUT STEEL 0 (SUTURE)
SUT STEEL 0 18XMFL TIE 17 (SUTURE) IMPLANT
SUT VIC AB 2-0 CT2 18 VCP726D (SUTURE) ×8 IMPLANT
TOWEL GREEN STERILE (TOWEL DISPOSABLE) ×4 IMPLANT
TOWEL GREEN STERILE FF (TOWEL DISPOSABLE) ×4 IMPLANT
TRAY FOLEY MTR SLVR 16FR STAT (SET/KITS/TRAYS/PACK) ×4 IMPLANT
TUBE CONNECTING 12'X1/4 (SUCTIONS) ×1
TUBE CONNECTING 12X1/4 (SUCTIONS) ×3 IMPLANT
UNDERPAD 30X36 HEAVY ABSORB (UNDERPADS AND DIAPERS) IMPLANT
WATER STERILE IRR 1000ML POUR (IV SOLUTION) ×4 IMPLANT

## 2021-06-21 NOTE — Anesthesia Procedure Notes (Signed)
Arterial Line Insertion Start/End7/12/2020 10:10 AM, 06/21/2021 10:15 AM Performed by: CRNA  Preanesthetic checklist: patient identified, IV checked, site marked, risks and benefits discussed, surgical consent, monitors and equipment checked, pre-op evaluation, timeout performed and anesthesia consent Lidocaine 1% used for infiltration Left, radial was placed Catheter size: 20 G Hand hygiene performed  and maximum sterile barriers used   Attempts: 1 Procedure performed without using ultrasound guided technique. Following insertion, dressing applied and Biopatch. Post procedure assessment: normal  Patient tolerated the procedure well with no immediate complications.

## 2021-06-21 NOTE — H&P (Signed)
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  The sinus was still patent at that time.  Over time, the patent has now been completely occluded by the tumor.  It now extends on both the left and right side.  He had a cranioplasty originally and he was asking me, would he need another cranioplasty.  I do not believe so.  The bone, meaning the skull, in that area has been radiated and has no tumor which is growing from that bone.  Therefore, I believe I can remove the plasty and the bone, whatever I need to expose it.  I can take the sinus now where the tumor is located and hopefully achieve a complete or near complete resection unlike the previous procedure.  He is alert, oriented by 4.  He answers all questions appropriately.  Memory, language, attention span, fund of knowledge are normal.  Speech is clear, it is also fluent.  He has no drift.  Normal muscle tone, bulk. No Known Allergies Past Medical History:  Diagnosis Date   Arthritis    GERD (gastroesophageal reflux disease)    Glaucoma    pt denies.    History of blood transfusion     5 units after previous surgery   History of radiation therapy 12/09/18- 01/24/2019   Left sagittal brain, 11.8 Gy in 31 fractions for a total dose of 55.8 Gy   Macular degeneration    Stroke [facility])    after surgery in August 2019 mostly recovered still some weakness in left arm   Wears glasses    Past Surgical History:  Procedure Laterality Date   BRAIN SURGERY     CHOLECYSTECTOMY  2008   lap choli   COLONOSCOPY     CRANIOPLASTY N/A 10/29/2018   Procedure: CRANIOPLASTY;  Surgeon: [name], MD;  Location: [facility];  Service: Neurosurgery;  Laterality: N/A;  CRANIOPLASTY   CRANIOTOMY N/A  08/18/2018   Procedure: CRANIOTOMY TUMOR EXCISION;  Surgeon: [name], MD;  Location: [facility];  Service: Neurosurgery;  Laterality: N/A;  CRANIOTOMY TUMOR EXCISION   DIAGNOSTIC LAPAROSCOPY     EYE SURGERY     laser surgery to relieve pressure   NAILBED REPAIR Left 05/04/2014   Procedure: LEFT INDEX NAIL ABLATION V-Y FLAP COVERAGE;  Surgeon: [name]., MD;  Location: [facility];  Service: Orthopedics;  Laterality: Left;  index   PILONIDAL CYST EXCISION     x2   TONSILLECTOMY     VASECTOMY     Family History  Problem Relation Age of Onset   Diabetes Mother    Skin cancer Brother    Congenital heart disease Brother    Social History   Socioeconomic History   Marital status: Married    Spouse name: Not on file   Number of children: Not on file   Years of education: Not on file   Highest education level: Not on file  Occupational History   Not on file  Tobacco Use   Smoking status: Never   Smokeless tobacco: Never  Vaping Use   Vaping Use:  Never used  Substance and Sexual Activity   Alcohol use: Yes    Comment: occasionally   Drug use: No   Sexual activity: Not Currently  Other Topics Concern   Not on file  Social History Narrative   Not on file   Social Determinants of Health   Financial Resource Strain: Not on file  Food Insecurity: Not on file  Transportation Needs: Not on file  Physical Activity: Not on file  Stress: Not on file  Social Connections: Not on file  Intimate Partner Violence: Not on file   Prior to Admission medications   Medication Sig Start Date End Date Taking? Authorizing Provider  levETIRAcetam (KEPPRA) 500 MG tablet Take 1 tablet (500 mg total) by mouth 2 (two) times daily. 05/06/21  Yes Vaslow, Acey Lav, MD  Multiple Vitamins-Minerals (PRESERVISION AREDS 2) CAPS Take 1 capsule by mouth 2 (two) times daily.    Yes [provider]  Red Yeast Rice Extract 600 MG CAPS Take 1,200 mg by mouth daily. 01/22/19  Yes  [provider]       He had weakness in his left leg previously secondary to the location of the tumor and I told him he can expect that again as we are going to have to remove the tumor and again, be very delicate in and around the brain at that area.  He weighs 187 pounds.  Temperature is 97.1, blood pressure 116/78, pulse is 62, pain is 0/10.  I have scheduled this for July 1st with the [facility] system.

## 2021-06-21 NOTE — Anesthesia Preprocedure Evaluation (Addendum)
Anesthesia Evaluation  Patient identified by MRN, date of birth, ID band  Reviewed: Allergy & Precautions, NPO status , Patient's Chart, lab work & pertinent test results  History of Anesthesia Complications Negative for: history of anesthetic complications  Airway Mallampati: II  TM Distance: >3 FB Neck ROM: Full    Dental  (+) Dental Advisory Given   Pulmonary neg shortness of breath, neg sleep apnea, neg COPD, neg recent URI,  Covid-19 Nucleic Acid Test Results Lab Results      Component                Value               Date                      SARSCOV2NAA              NEGATIVE            06/18/2021              breath sounds clear to auscultation       Cardiovascular negative cardio ROS   Rhythm:Regular     Neuro/Psych Seizures -,  Meningioma CVA    GI/Hepatic GERD  ,(+) Hepatitis -  Endo/Other  negative endocrine ROS  Renal/GU negative Renal ROS     Musculoskeletal  (+) Arthritis ,   Abdominal   Peds  Hematology negative hematology ROS (+) Lab Results      Component                Value               Date                      WBC                      5.7                 06/18/2021                HGB                      15.0                06/18/2021                HCT                      45.9                06/18/2021                MCV                      93.3                06/18/2021                PLT                      164                 06/18/2021              Anesthesia Other Findings   Reproductive/Obstetrics  Anesthesia Physical Anesthesia Plan  ASA: 2  Anesthesia Plan: General   Post-op Pain Management:    Induction: Intravenous  PONV Risk Score and Plan: 2 and Ondansetron and Dexamethasone  Airway Management Planned: Oral ETT  Additional Equipment: Arterial line  Intra-op Plan:   Post-operative Plan: Extubation in  OR  Informed Consent: I have reviewed the patients History and Physical, chart, labs and discussed the procedure including the risks, benefits and alternatives for the proposed anesthesia with the patient or authorized representative who has indicated his/her understanding and acceptance.     Dental advisory given  Plan Discussed with: CRNA and Surgeon  Anesthesia Plan Comments:         Anesthesia Quick Evaluation                                  Anesthesia Evaluation  Patient identified by MRN, date of birth, ID band Patient awake    Reviewed: Allergy & Precautions, NPO status , Patient's Chart, lab work & pertinent test results  History of Anesthesia Complications (+) DIFFICULT AIRWAY and history of anesthetic complications  Airway Mallampati: III  TM Distance: >3 FB Neck ROM: Full    Dental no notable dental hx.    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 CVA, Residual Symptoms negative psych ROS   GI/Hepatic Neg liver ROS, GERD  Medicated,  Endo/Other  negative endocrine ROS  Renal/GU negative Renal ROS  negative genitourinary   Musculoskeletal negative musculoskeletal ROS (+)   Abdominal   Peds  Hematology negative hematology ROS (+)   Anesthesia Other Findings   Reproductive/Obstetrics                           Anesthesia Physical Anesthesia Plan  ASA: III  Anesthesia Plan: General   Post-op Pain Management:    Induction: Intravenous  PONV Risk Score and Plan: 2 and Ondansetron, Dexamethasone and Treatment may vary due to age or medical condition  Airway Management Planned: Oral ETT and Video Laryngoscope Planned  Additional Equipment: None  Intra-op Plan:   Post-operative Plan:   Informed Consent: I have  reviewed the patients History and Physical, chart, labs and discussed the procedure including the risks, benefits and alternatives for the proposed anesthesia with the patient or authorized representative who has indicated his/her understanding and acceptance.     Plan Discussed with:   Anesthesia Plan Comments: (Per records patient was an easy mask ventilation but difficult intubation by direct laryngoscopy. Plan for Glidescope intubation.)      Anesthesia Quick Evaluation

## 2021-06-21 NOTE — Anesthesia Procedure Notes (Signed)
Central Venous Catheter Insertion Performed by: Oleta Mouse, MD, anesthesiologist Start/End7/12/2020 11:18 AM, 06/21/2021 11:24 AM Patient location: OR. Preanesthetic checklist: patient identified, IV checked, site marked, risks and benefits discussed, surgical consent, monitors and equipment checked, pre-op evaluation, timeout performed and anesthesia consent Position: supine Patient sedated Hand hygiene performed  and maximum sterile barriers used  Catheter size: 8 Fr Total catheter length 16. Central line was placed.Double lumen Procedure performed using ultrasound guided technique. Ultrasound Notes:anatomy identified, needle tip was noted to be adjacent to the nerve/plexus identified, no ultrasound evidence of intravascular and/or intraneural injection and image(s) printed for medical record Attempts: 1 Following insertion, dressing applied, line sutured and Biopatch. Post procedure assessment: blood return through all ports and free fluid flow  Patient tolerated the procedure well with no immediate complications.

## 2021-06-21 NOTE — Anesthesia Procedure Notes (Signed)
Procedure Name: Intubation Date/Time: 06/21/2021 11:04 AM Performed by: Lowella Dell, CRNA Pre-anesthesia Checklist: Patient identified, Emergency Drugs available, Suction available, Patient being monitored and Timeout performed Patient Re-evaluated:Patient Re-evaluated prior to induction Oxygen Delivery Method: Circle system utilized Preoxygenation: Pre-oxygenation with 100% oxygen Induction Type: IV induction Ventilation: Two handed mask ventilation required and Oral airway inserted - appropriate to patient size Laryngoscope Size: Glidescope and 4 Grade View: Grade I Tube type: Oral Tube size: 7.5 mm Number of attempts: 1 Airway Equipment and Method: Rigid stylet and Video-laryngoscopy Placement Confirmation: ETT inserted through vocal cords under direct vision, positive ETCO2 and breath sounds checked- equal and bilateral Secured at: 22 cm Tube secured with: Tape Dental Injury: Teeth and Oropharynx as per pre-operative assessment  Difficulty Due To: Difficult Airway- due to anterior larynx Future Recommendations: Recommend- induction with short-acting agent, and alternative techniques readily available

## 2021-06-21 NOTE — Transfer of Care (Signed)
Immediate Anesthesia Transfer of Care Note  Patient: James Olsen  Procedure(s) Performed: Bilateral Craniotomy for tumor resection (Bilateral) APPLICATION OF CRANIAL NAVIGATION  Patient Location: PACU  Anesthesia Type:General  Level of Consciousness: awake and alert   Airway & Oxygen Therapy: Patient Spontanous Breathing and Patient connected to face mask oxygen  Post-op Assessment: Report given to RN and Post -op Vital signs reviewed and stable  Post vital signs: Reviewed and stable  Last Vitals:  Vitals Value Taken Time  BP 123/66 06/21/21 1545  Temp    Pulse 88 06/21/21 1555  Resp 10 06/21/21 1555  SpO2 100 % 06/21/21 1555  Vitals shown include unvalidated device data.  Last Pain:  Vitals:   06/21/21 0816  TempSrc:   PainSc: 0-No pain         Complications: No notable events documented.

## 2021-06-22 ENCOUNTER — Inpatient Hospital Stay (HOSPITAL_COMMUNITY): Payer: Medicare Other

## 2021-06-22 MED ORDER — PANTOPRAZOLE SODIUM 40 MG PO TBEC
40.0000 mg | DELAYED_RELEASE_TABLET | Freq: Every day | ORAL | Status: DC
  Administered 2021-06-22: 40 mg via ORAL
  Filled 2021-06-22 (×4): qty 1

## 2021-06-22 NOTE — Progress Notes (Signed)
This RN called into the room by patient at 2045 d/t R weakness. Pt was noted to have RUE weakness and BL LE weakness on assessment at 2000. Pt feels that weakness is worse and states "it feels like previous stroke". Weakness comparable to prior assessment. Dr. Kathyrn Sheriff on the unit, discussed the weakness noted above with him. He stated the R arm weakness and BL leg weakness was expected for the procedure that was done. RN will continue to monitor.

## 2021-06-22 NOTE — Progress Notes (Signed)
  NEUROSURGERY PROGRESS NOTE   Pt seen and examined. ? Increased RUE weakness last pm.  EXAM: Temp:  [97.7 F (36.5 C)-98.8 F (37.1 C)] 98.1 F (36.7 C) (07/02 0800) Pulse Rate:  [56-89] 79 (07/02 0930) Resp:  [9-21] 14 (07/02 0930) BP: (107-142)/(57-81) 135/73 (07/02 0900) SpO2:  [94 %-100 %] 96 % (07/02 0930) Arterial Line BP: (84-178)/(59-143) 97/87 (07/02 0930) Weight:  [86.4 kg] 86.4 kg (07/01 1647) Intake/Output      07/01 0701 07/02 0700 07/02 0701 07/03 0700   I.V. (mL/kg) 2585.1 (29.9) 178.7 (2.1)   IV Piggyback 600    Total Intake(mL/kg) 3185.1 (36.9) 178.7 (2.1)   Urine (mL/kg/hr) 1850    Blood 600    Total Output 2450    Net +735.1 +178.7         Awake, alert CN grossly intact Good strength LUE 3/5 proximal RUE, 4/5 grip Flicker 1/5 biltateral quad, no movement distal BLE Wound c/d/i  LABS: Lab Results  Component Value Date   CREATININE 0.78 06/21/2021   BUN 15 06/18/2021   NA 139 06/21/2021   K 4.2 06/21/2021   CL 100 06/18/2021   CO2 29 06/18/2021   Lab Results  Component Value Date   WBC 8.0 06/21/2021   HGB 11.9 (L) 06/21/2021   HCT 36.0 (L) 06/21/2021   MCV 90.9 06/21/2021   PLT 132 (L) 06/21/2021    IMAGING: CTH reviewed demonstrating resection of meningioma, no compressive hematoma or HCP. Left frontal hypodensity suggesting perioperative stroke, can't r/o venous infarction  IMPRESSION: - 75 y.o. 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 Has RUE and BLE weakness postop  PLAN: - PT/OT eval today - can d/c CVC and A-line - cont supportive care   Consuella Lose, MD [facility]

## 2021-06-22 NOTE — Progress Notes (Signed)
0215: Pt called out to be repositioned. Upon assessment, RUE noted to be significantly weaker. Pt unable to break gravity. Neurosurgery paged. Order for Head CT. RN will continue to monitor.

## 2021-06-23 MED ORDER — BACLOFEN 10 MG PO TABS
10.0000 mg | ORAL_TABLET | Freq: Three times a day (TID) | ORAL | Status: DC
  Administered 2021-06-23 – 2021-06-26 (×11): 10 mg via ORAL
  Filled 2021-06-23 (×12): qty 1

## 2021-06-23 NOTE — Evaluation (Addendum)
Physical Therapy Evaluation Patient Details Name: James Olsen MRN: [id number] DOB: 05/05/1946 Today's Date: 06/23/2021   History of Present Illness  Pt is a 75 y.o. M who presents s/p resection of recurrent parasagittal meningioma, requiring sacrifice of SSS. PMH: prior resection with craniotomy, meningioma.  Clinical Impression  Prior to admission, pt lives with his spouse and is independent. Pt enjoys gardening and walking; reports walking up to 10,000 steps/day. Pt presents with decreased functional mobility secondary to BLE hypertonicity, mild RUE weakness, impaired balance, cognitive deficits. Performed aggressive PROM to BLE's in sitting position prior to transfer training to promote increased knee flexion and ankle dorsiflexion. Pt then able to perform a subsequent x 6 stands with use of Denna Haggard to provide knee block. Utilized Stedy to transfer on and off bedside commode. Highly recommend CIR to address deficits and maximize functional independence. Suspect excellent progress given PLOF and motivation.     Follow Up Recommendations CIR    Equipment Recommendations  Wheelchair cushion (measurements PT);Wheelchair (measurements PT);3in1 (PT)    Recommendations for Other Services       Precautions / Restrictions Precautions Precautions: Fall;Other (comment) Precaution Comments: BLE hypertonicity Restrictions Weight Bearing Restrictions: No      Mobility  Bed Mobility               General bed mobility comments: Received OOB in chair    Transfers Overall transfer level: Needs assistance Equipment used: Ambulation equipment used Transfers: Sit to/from Stand Sit to Stand: Mod assist         General transfer comment: Pt performing x 6 sit to stands in [place] with manual assist for bilateral knee flexion and ankle dorsiflexion for optimal placement and cues for pulling up with BUE's. Once standing, cues for hip extension and cervical extension. Utilized Stedy to  transfer on and off BSC.  Ambulation/Gait                Stairs            Wheelchair Mobility    Modified Rankin (Stroke Patients Only) Modified Rankin (Stroke Patients Only) Pre-Morbid Rankin Score: No symptoms Modified Rankin: Severe disability     Balance Overall balance assessment: Needs assistance Sitting-balance support: Feet supported Sitting balance-Leahy Scale: Fair     Standing balance support: Single extremity supported;During functional activity Standing balance-Leahy Scale: Poor Standing balance comment: reliant on single UE support when performing functional reaching task in [place]                             Pertinent Vitals/Pain Pain Assessment: Faces Faces Pain Scale: Hurts a little bit Pain Location: BLE's Pain Descriptors / Indicators: Spasm Pain Intervention(s): Monitored during session    Home Living Family/patient expects to be discharged to:: Private residence Living Arrangements: Spouse/significant other Available Help at Discharge: Family;Available 24 hours/day Type of Home: House Home Access: Stairs to enter Entrance Stairs-Rails: Psychiatric nurse of Steps: 3 Home Layout: Able to live on main level with bedroom/bathroom Home Equipment: Shower seat      Prior Function Level of Independence: Independent         Comments: Enjoys gardening, walking, hiking, model railroading     Hand Dominance        Extremity/Trunk Assessment   Upper Extremity Assessment Upper Extremity Assessment: RUE deficits/detail;LUE deficits/detail RUE Deficits / Details: Strength 4/5 LUE Deficits / Details: Strength 5/5    Lower Extremity Assessment Lower Extremity Assessment:  RLE deficits/detail;LLE deficits/detail RLE Deficits / Details: Hypertonicity going into knee extension and ankle plantarflexion; able to reduce with aggressive passive ROM LLE Deficits / Details: Hypertonicity going into knee extension  and ankle plantarflexion; able to reduce with aggressive passive ROM    Cervical / Trunk Assessment Cervical / Trunk Assessment: Normal  Communication   Communication: Expressive difficulties  Cognition Arousal/Alertness: Awake/alert Behavior During Therapy: WFL for tasks assessed/performed Overall Cognitive Status: Impaired/Different from baseline Area of Impairment: Problem solving                             Problem Solving: Slow processing General Comments: Mildly slower processing, pt reports word finding difficulties.      General Comments      Exercises General Exercises - Lower Extremity Ankle Circles/Pumps: PROM;Both;10 reps;Seated Gluteal Sets: Both;5 reps;Standing Other Exercises Other Exercises: Standing in [place]: head turns R/L, up and down; functional reaching tasks with single UE Other Exercises: Sitting: aggressive PROM bilateral ankle dorsiflexion, knee flexion Other Exercises: Sitting: sit ups x 10, pt pulling forward on back of chair to increase weightbearing/stretch through BLE's   Assessment/Plan    PT Assessment Patient needs continued PT services  PT Problem List Decreased strength;Decreased activity tolerance;Decreased balance;Decreased mobility;Decreased coordination;Decreased cognition;Impaired tone       PT Treatment Interventions DME instruction;Gait training;Stair training;Functional mobility training;Therapeutic activities;Therapeutic exercise;Balance training;Neuromuscular re-education;Cognitive remediation;Patient/family education    PT Goals (Current goals can be found in the Care Plan section)  Acute Rehab PT Goals Patient Stated Goal: "get better." PT Goal Formulation: With patient Time For Goal Achievement: 07/07/21 Potential to Achieve Goals: Good    Frequency Min 4X/week   Barriers to discharge        Co-evaluation               AM-PAC PT "6 Clicks" Mobility  Outcome Measure Help needed turning from your  back to your side while in a flat bed without using bedrails?: A Lot Help needed moving from lying on your back to sitting on the side of a flat bed without using bedrails?: A Lot Help needed moving to and from a bed to a chair (including a wheelchair)?: A Lot Help needed standing up from a chair using your arms (e.g., wheelchair or bedside chair)?: A Lot Help needed to walk in hospital room?: Total Help needed climbing 3-5 steps with a railing? : Total 6 Click Score: 10    End of Session   Activity Tolerance: Patient tolerated treatment well Patient left: in chair;with call bell/phone within reach;with chair alarm set Nurse Communication: Mobility status PT Visit Diagnosis: Other abnormalities of gait and mobility (R26.89);Difficulty in walking, not elsewhere classified (R26.2)    Time: 6073-7106 PT Time Calculation (min) (ACUTE ONLY): 46 min   Charges:   PT Evaluation $PT Eval Moderate Complexity: 1 Mod PT Treatments $Therapeutic Exercise: 8-22 mins $Therapeutic Activity: 8-22 mins        Wyona Almas, PT, DPT Acute Rehabilitation Services Pager [phone] Office [phone]   Deno Etienne 06/23/2021, 11:20 AM

## 2021-06-23 NOTE — Progress Notes (Signed)
Inpatient Rehab Admissions Coordinator Note:   Per PT recommendations, pt was screened for CIR candidacy by Gayland Curry, MS, CCC-SLP.  At this time we are recommending an inpatient rehab consult. Please place an IP Rehab MD consult order if pt would like to be considered. Please contact me with questions.    Gayland Curry, [place], [place] Admissions Coordinator [phone] 06/23/21 5:05 PM

## 2021-06-23 NOTE — Progress Notes (Signed)
Subjective: Patient reports no headache  Objective: Vital signs in last 24 hours: Temp:  [98.6 F (37 C)-99 F (37.2 C)] 99 F (37.2 C) (07/03 0800) Pulse Rate:  [58-88] 68 (07/03 0900) Resp:  [9-24] 14 (07/03 0900) BP: (122-154)/(64-90) 125/78 (07/03 0900) SpO2:  [93 %-98 %] 94 % (07/03 0900)  Intake/Output from previous day: 07/02 0701 - 07/03 0700 In: 2242.5 [P.O.:240; I.V.:2002.5] Out: 2400 [Urine:2400] Intake/Output this shift: Total I/O In: 178.9 [I.V.:178.9] Out: -   Sitting in chair, NAD LLE 3/5, RLE 2/5, RUE 3/5, LUE 5/5  Lab Results: Recent Labs    06/21/21 1409 06/21/21 1723  WBC  --  8.0  HGB 11.6* 11.9*  HCT 34.0* 36.0*  PLT  --  132*   BMET Recent Labs    06/21/21 1409 06/21/21 1723  NA 139  --   K 4.2  --   CREATININE  --  0.78    Studies/Results: CT HEAD WO CONTRAST  Result Date: 06/22/2021 CLINICAL DATA:  Encephalopathy EXAM: CT HEAD WITHOUT CONTRAST TECHNIQUE: Contiguous axial images were obtained from the base of the skull through the vertex without intravenous contrast. COMPARISON:  None. FINDINGS: Brain: Right anterior pneumocephalus. Hypodensity in the superior left hemisphere. The previously visible vertex meningioma as been resected. Small amount of residual hyperdensity at the right parafalcine region (image 46). Vascular: No abnormal hyperdensity of the major intracranial arteries or dural venous sinuses. No intracranial atherosclerosis. Skull: Vertex craniotomy. Sinuses/Orbits: No fluid levels or advanced mucosal thickening of the visualized paranasal sinuses. No mastoid or middle ear effusion. The orbits are normal. IMPRESSION: 1. Status post resection of vertex meningioma. Small amount of residual hyperdensity at the right parafalcine region, possibly residual mass. 2. Hypodensity in the superior left hemisphere, likely indicating a recent infarct. Electronically Signed   By: Ulyses Jarred M.D.   On: 06/22/2021 03:18     Assessment/Plan: S/p resection of parasaggital meningioma - transfer to PCU - cont PT/OT - likely rehab candidate - dex taper starting tomorrow - cont Keppra   Vallarie Mare 06/23/2021, 10:37 AM

## 2021-06-24 MED ORDER — DEXAMETHASONE 4 MG PO TABS
4.0000 mg | ORAL_TABLET | Freq: Three times a day (TID) | ORAL | Status: DC
  Administered 2021-06-24 – 2021-06-26 (×7): 4 mg via ORAL
  Filled 2021-06-24 (×7): qty 1

## 2021-06-24 NOTE — Evaluation (Signed)
Occupational Therapy Evaluation Patient Details Name: James Olsen MRN: [id number] DOB: January 01, 1946 Today's Date: 06/24/2021    History of Present Illness Pt is a 75 y.o. M who presents s/p resection of recurrent parasagittal meningioma, requiring sacrifice of SSS. PMH: prior resection with craniotomy, meningioma.   Clinical Impression   Patient is s/p resection of meningioma surgery resulting in functional limitations due to the deficits listed below (see OT problem list). Pt currently with bil LE extension that affects balance for all adls. Pt is dependent on max +2 (A) for transfers as a stand pivot only. Patient will benefit from skilled OT acutely to increase independence and safety with ADLS to allow discharge CIR.     Follow Up Recommendations  CIR    Equipment Recommendations  3 in 1 bedside commode    Recommendations for Other Services Rehab consult     Precautions / Restrictions Precautions Precautions: Fall;Other (comment) Precaution Comments: BLE hypertonicity Restrictions Weight Bearing Restrictions: No      Mobility Bed Mobility Overal bed mobility: Needs Assistance Bed Mobility: Supine to Sit;Rolling Rolling: Mod assist   Supine to sit: Max assist;+2 for physical assistance     General bed mobility comments: Rolling to R/L to remove bed pan and for peri care with modA, cues for reaching with contralateral arm for railing. Progressing supine > sit with assist for BLE's, kicked into excessive hip/knee extension thus requiring knee block    Transfers Overall transfer level: Needs assistance Equipment used: 2 person hand held assist Transfers: Sit to/from [facility] Sit to Stand: Mod assist;+2 physical assistance Stand pivot transfers: Max assist;+2 physical assistance       General transfer comment: Pt requiring bilateral foot/knee block, cues for "nose over toes," for sequencing initiating transfer, good initiation but kicks into  excessive knee hyperextension upon standing. Requiring modA + 2 to stand multiple times from recliner, maxA + 2 for pivot towards right into chair    Balance Overall balance assessment: Needs assistance Sitting-balance support: Feet supported Sitting balance-Leahy Scale: Poor Sitting balance - Comments: Min guard for safety with bilat knee block   Standing balance support: During functional activity;Bilateral upper extremity supported Standing balance-Leahy Scale: Poor Standing balance comment: holding onto PT/OT elbow for external support                           ADL either performed or assessed with clinical judgement   ADL Overall ADL's : Needs assistance/impaired Eating/Feeding: Set up;Bed level   Grooming: Set up;Bed level       Lower Body Bathing: Total assistance Lower Body Bathing Details (indicate cue type and reason): rolling r for peri hygiene     Lower Body Dressing: Total assistance                 General ADL Comments: pt on bed pan on arrival and voiding. Pt with total (A) to don socks.pt is able to initiate knee flexion with command on LLE     Vision Baseline Vision/History: Wears glasses Wears Glasses: At all times Vision Assessment?: No apparent visual deficits     Perception     Praxis      Pertinent Vitals/Pain Pain Assessment: No/denies pain     Hand Dominance Right   Extremity/Trunk Assessment Upper Extremity Assessment Upper Extremity Assessment: RUE deficits/detail RUE Deficits / Details: 4 out 5 but with fine motor deficits PTA RUE Coordination: decreased fine motor  Cervical / Trunk Assessment Cervical / Trunk Assessment: Normal   Communication Communication Communication: Expressive difficulties   Cognition Arousal/Alertness: Awake/alert Behavior During Therapy: WFL for tasks assessed/performed Overall Cognitive Status: Impaired/Different from baseline Area of Impairment: Problem solving                              Problem Solving: Slow processing General Comments: Mildly slower processing, pt reports word finding difficulties.   General Comments       Exercises Exercises: Other exercises;General Lower Extremity General Exercises - Lower Extremity Ankle Circles/Pumps: PROM;Both;10 reps;Supine Heel Slides: PROM;Both;10 reps;Supine Other Exercises Other Exercises: Sitting: passive bilateral knee flexion stretch with over pressure to ensure foot flat x 2 minutes Other Exercises: demonstrates taking cap on marker for fine motor work   Shoulder Instructions      Home Living Family/patient expects to be discharged to:: Private residence Living Arrangements: Spouse/significant other Available Help at Discharge: Family;Available 24 hours/day Type of Home: House Home Access: Stairs to enter [employer] of Steps: 3 Entrance Stairs-Rails: Right;Left Home Layout: Able to live on main level with bedroom/bathroom     Bathroom Shower/Tub: Tub/shower unit;Walk-in shower         Home Equipment: Shower seat   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 has 2 cats ( glove and ginger)      Prior Functioning/Environment Level of Independence: Independent                 OT Problem List: Impaired UE functional use;Decreased knowledge of precautions;Decreased knowledge of use of DME or AE;Decreased safety awareness;Impaired balance (sitting and/or standing);Decreased activity tolerance;Impaired tone      OT Treatment/Interventions: Self-care/ADL training;Therapeutic exercise;Neuromuscular education;DME and/or AE instruction;Energy conservation;Manual therapy;Modalities;Therapeutic activities;Patient/family education;Balance training    OT Goals(Current goals can be found in the care plan section) Acute Rehab OT Goals Patient Stated Goal: to go back to gardening OT Goal Formulation: With patient Time For Goal Achievement: 07/08/21 Potential to  Achieve Goals: Good  OT Frequency: Min 2X/week   Barriers to D/C:            Co-evaluation PT/OT/SLP Co-Evaluation/Treatment: Yes Reason for Co-Treatment: Complexity of the patient's impairments (multi-system involvement);For patient/therapist safety;To address functional/ADL transfers PT goals addressed during session: Mobility/safety with mobility;Strengthening/ROM OT goals addressed during session: ADL's and self-care;Proper use of Adaptive equipment and DME;Strengthening/ROM      AM-PAC OT "6 Clicks" Daily Activity     Outcome Measure Help from another person eating meals?: None Help from another person taking care of personal grooming?: A Little Help from another person toileting, which includes using toliet, bedpan, or urinal?: A Lot Help from another person bathing (including washing, rinsing, drying)?: A Lot Help from another person to put on and taking off regular upper body clothing?: A Little Help from another person to put on and taking off regular lower body clothing?: A Lot 6 Click Score: 16   End of Session Equipment Utilized During Treatment: Gait belt Nurse Communication: Mobility status;Precautions  Activity Tolerance: Patient tolerated treatment well Patient left: in chair;with call bell/phone within reach;with chair alarm set  OT Visit Diagnosis: Unsteadiness on feet (R26.81)                Time: 7893-8101 OT Time Calculation (min): 30 min Charges:  OT General Charges $OT Visit: 1 Visit OT Evaluation $OT Eval Moderate Complexity: 1 Mod   Brynn, OTR/L  Acute Rehabilitation Services Pager: [phone] Office: [phone] .  Jeri Modena 06/24/2021, 9:55 AM

## 2021-06-24 NOTE — Progress Notes (Signed)
Physical Therapy Treatment Patient Details Name: James Olsen MRN: [id number] DOB: 03-09-1946 Today's Date: 06/24/2021    History of Present Illness Pt is a 75 y.o. M who presents s/p resection of recurrent parasagittal meningioma, requiring sacrifice of SSS. PMH: prior resection with craniotomy, meningioma.    PT Comments    Pt progressing towards his physical therapy goals; very motivated and eager to participate with good activity tolerance throughout. Pt denying pain. Continues to demonstrate BLE hypertonicity (RLE > LLE); received Baclofen prior to session. PT performed aggressive PROM to BLE's in supine and sitting position prior to attempting out of bed mobility. Requiring two person mod-max assist for transfers and pre gait training (weight shifting). Continue to recommend comprehensive inpatient rehab (CIR) for post-acute therapy needs.   Follow Up Recommendations  CIR     Equipment Recommendations  Wheelchair cushion (measurements PT);Wheelchair (measurements PT);3in1 (PT)    Recommendations for Other Services       Precautions / Restrictions Precautions Precautions: Fall;Other (comment) Precaution Comments: BLE hypertonicity Restrictions Weight Bearing Restrictions: No    Mobility  Bed Mobility Overal bed mobility: Needs Assistance Bed Mobility: Supine to Sit;Rolling Rolling: Mod assist   Supine to sit: Max assist;+2 for physical assistance     General bed mobility comments: Rolling to R/L to remove bed pan and for peri care with modA, cues for reaching with contralateral arm for railing. Progressing supine > sit with assist for BLE's, kicked into excessive hip/knee extension thus requiring knee block    Transfers Overall transfer level: Needs assistance Equipment used: 2 person hand held assist Transfers: Sit to/from [facility] Sit to Stand: Mod assist;+2 physical assistance Stand pivot transfers: Max assist;+2 physical assistance        General transfer comment: Pt requiring bilateral foot/knee block, cues for "nose over toes," for sequencing initiating transfer, good initiation but kicks into excessive knee hyperextension upon standing. Requiring modA + 2 to stand multiple times from recliner, maxA + 2 for pivot towards right into chair  Ambulation/Gait                 Stairs             Wheelchair Mobility    Modified Rankin (Stroke Patients Only) Modified Rankin (Stroke Patients Only) Pre-Morbid Rankin Score: No symptoms Modified Rankin: Severe disability     Balance Overall balance assessment: Needs assistance Sitting-balance support: Feet supported Sitting balance-Leahy Scale: Poor Sitting balance - Comments: Min guard for safety with bilat knee block   Standing balance support: During functional activity;Bilateral upper extremity supported Standing balance-Leahy Scale: Poor Standing balance comment: holding onto PT/OT elbow for external support                            Cognition Arousal/Alertness: Awake/alert Behavior During Therapy: WFL for tasks assessed/performed Overall Cognitive Status: Impaired/Different from baseline Area of Impairment: Problem solving                             Problem Solving: Slow processing General Comments: Mildly slower processing, pt reports word finding difficulties.      Exercises General Exercises - Lower Extremity Ankle Circles/Pumps: PROM;Both;10 reps;Supine Heel Slides: PROM;Both;10 reps;Supine Other Exercises Other Exercises: Sitting: passive bilateral knee flexion stretch with over pressure to ensure foot flat x 2 minutes    General Comments        Pertinent Vitals/Pain Pain  Assessment: No/denies pain    Home Living                      Prior Function            PT Goals (current goals can now be found in the care plan section) Acute Rehab PT Goals Patient Stated Goal: "get better." PT Goal  Formulation: With patient Time For Goal Achievement: 07/07/21 Potential to Achieve Goals: Good Progress towards PT goals: Progressing toward goals    Frequency    Min 4X/week      PT Plan Current plan remains appropriate    Co-evaluation PT/OT/SLP Co-Evaluation/Treatment: Yes Reason for Co-Treatment: Complexity of the patient's impairments (multi-system involvement);For patient/therapist safety;To address functional/ADL transfers PT goals addressed during session: Mobility/safety with mobility;Strengthening/ROM        AM-PAC PT "6 Clicks" Mobility   Outcome Measure  Help needed turning from your back to your side while in a flat bed without using bedrails?: A Lot Help needed moving from lying on your back to sitting on the side of a flat bed without using bedrails?: Total Help needed moving to and from a bed to a chair (including a wheelchair)?: Total Help needed standing up from a chair using your arms (e.g., wheelchair or bedside chair)?: Total Help needed to walk in hospital room?: Total Help needed climbing 3-5 steps with a railing? : Total 6 Click Score: 7    End of Session Equipment Utilized During Treatment: Gait belt Activity Tolerance: Patient tolerated treatment well Patient left: in chair;with call bell/phone within reach;with chair alarm set Nurse Communication: Mobility status PT Visit Diagnosis: Other abnormalities of gait and mobility (R26.89);Difficulty in walking, not elsewhere classified (R26.2)     Time: 2952-8413 PT Time Calculation (min) (ACUTE ONLY): 30 min  Charges:  $Therapeutic Activity: 8-22 mins                     Wyona Almas, PT, DPT Acute Rehabilitation Services Pager [phone] Office [place] 06/24/2021, 9:38 AM

## 2021-06-24 NOTE — Progress Notes (Signed)
Subjective: NAEs o/n.  Patient reports strength significantly improved  Objective: Vital signs in last 24 hours: Temp:  [97.8 F (36.6 C)-98.9 F (37.2 C)] 98.8 F (37.1 C) (07/04 0726) Pulse Rate:  [53-84] 64 (07/04 1000) Resp:  [9-15] 11 (07/04 1000) BP: (105-142)/(58-80) 130/76 (07/04 0800) SpO2:  [91 %-97 %] 93 % (07/04 1000)  Intake/Output from previous day: 07/03 0701 - 07/04 0700 In: 538.9 [P.O.:360; I.V.:178.9] Out: 1375 [Urine:1375] Intake/Output this shift: No intake/output data recorded.  Incision c/d Sitting in chair, NAD LLE 4-/5, RLE 2/5, RUE 4-/5, LUE 5/5   Lab Results: Recent Labs    06/21/21 1409 06/21/21 1723  WBC  --  8.0  HGB 11.6* 11.9*  HCT 34.0* 36.0*  PLT  --  132*   BMET Recent Labs    06/21/21 1409 06/21/21 1723  NA 139  --   K 4.2  --   CREATININE  --  0.78    Studies/Results: No results found.  Assessment/Plan: S/p resection of recurrent parasagittal meningioma - awaiting PCU  - cont PT - dex taper - likely CIR   Vallarie Mare 06/24/2021, 10:50 AM

## 2021-06-25 ENCOUNTER — Encounter (HOSPITAL_COMMUNITY): Payer: Self-pay | Admitting: Neurosurgery

## 2021-06-25 LAB — SURGICAL PATHOLOGY

## 2021-06-25 NOTE — Progress Notes (Signed)
Occupational Therapy Treatment Patient Details Name: James Olsen MRN: [id number] DOB: April 21, 1946 Today's Date: 06/25/2021    History of present illness Pt is a 75 y.o. M who presents s/p resection of recurrent parasagittal meningioma, requiring sacrifice of SSS. PMH: prior resection with craniotomy, meningioma.   OT comments  Pt with improved transfer from supine to EOB this session but continues to have strong tone R LE. Pt completed mini squats for sit<>Stand to help with knee flexion and reduce hyperextension. Recommendation CIR at this time.   Follow Up Recommendations  CIR    Equipment Recommendations  3 in 1 bedside commode    Recommendations for Other Services Rehab consult    Precautions / Restrictions Precautions Precautions: Fall Precaution Comments: BLE hypertonicity       Mobility Bed Mobility Overal bed mobility: Needs Assistance Bed Mobility: Rolling;Supine to Sit;Sit to Supine Rolling: Mod assist   Supine to sit: Mod assist Sit to supine: +2 for physical assistance;Mod assist   General bed mobility comments: pt needs (A) to sequence rolling to the left side and where to place R UE. pt pulling toward rail on L side. pt requires (A) To bring  bil LE off EOB but activating LLE. pt needs (A) with trunk elevation. pt static sitting with guarding to prevent extension and sliding off bed surface. pt requires bil LE onto bed surface to return supine. pt with knee flexion for stretching and buttock bridging with therapist blockign at ankles    Transfers Overall transfer level: Needs assistance Equipment used: 2 person hand held assist Transfers: Sit to/from Stand Sit to Stand: +2 physical assistance;Max assist         General transfer comment: pt completed sit<>stand 6 times during session    Balance Overall balance assessment: Needs assistance Sitting-balance support: Feet supported;Single extremity supported Sitting balance-Leahy Scale: Fair Sitting  balance - Comments: min guard to mod (A)   Standing balance support: Bilateral upper extremity supported;During functional activity Standing balance-Leahy Scale: Poor Standing balance comment: requires therapist weight shift patient and flexion facilitation                           ADL either performed or assessed with clinical judgement   ADL Overall ADL's : Needs assistance/impaired Eating/Feeding: Set up;Bed level                       Toilet Transfer: +2 for physical assistance;Maximal assistance Toilet Transfer Details (indicate cue type and reason): simulate sit<>STand from EOB.           General ADL Comments: pt completing mini squats at the eob to try to use flexion to help with tone with transfer. pt able to move L LE with therapist weight shifting patient however RLE just adducts to LLE due to tone. Pt requires multiple attempts at sit<>Stand to get pt foward flexion enough to keep some knee flexion and not just full knee extension     Vision       Perception     Praxis      Cognition Arousal/Alertness: Awake/alert Behavior During Therapy: WFL for tasks assessed/performed Overall Cognitive Status: Impaired/Different from baseline Area of Impairment: Problem solving                             Problem Solving: Slow processing General Comments: Pt with delayed responses to but with increase time.  Exercises Exercises: Other exercises Other Exercises Other Exercises: clam shells each side x15 reps Other Exercises: bridging buttock x5 reps, Other Exercises: weight bearing and knee flexion at EOB   Shoulder Instructions       General Comments VSS    Pertinent Vitals/ Pain       Pain Assessment: No/denies pain  Home Living                                          Prior Functioning/Environment              Frequency  Min 2X/week        Progress Toward Goals  OT Goals(current goals  can now be found in the care plan section)  Progress towards OT goals: Progressing toward goals  Acute Rehab OT Goals Patient Stated Goal: to get to moving two times a day while i am here OT Goal Formulation: With patient Time For Goal Achievement: 07/08/21 Potential to Achieve Goals: Good ADL Goals Pt Will Transfer to Toilet: with +2 assist;with mod assist;stand pivot transfer;bedside commode Additional ADL Goal #1: pt will complete bed mobility min (A) as precursor to adls. Additional ADL Goal #2: pt will complete sit<>Stand total +2 min (A) as precursor to adls.  Plan Discharge plan remains appropriate    Co-evaluation    PT/OT/SLP Co-Evaluation/Treatment: Yes Reason for Co-Treatment: Complexity of the patient's impairments (multi-system involvement);For patient/therapist safety;To address functional/ADL transfers   OT goals addressed during session: ADL's and self-care;Proper use of Adaptive equipment and DME;Strengthening/ROM      AM-PAC OT "6 Clicks" Daily Activity     Outcome Measure   Help from another person eating meals?: None Help from another person taking care of personal grooming?: A Little Help from another person toileting, which includes using toliet, bedpan, or urinal?: A Lot Help from another person bathing (including washing, rinsing, drying)?: A Lot Help from another person to put on and taking off regular upper body clothing?: A Little Help from another person to put on and taking off regular lower body clothing?: A Lot 6 Click Score: 16    End of Session Equipment Utilized During Treatment: Gait belt  OT Visit Diagnosis: Unsteadiness on feet (R26.81)   Activity Tolerance Patient tolerated treatment well   Patient Left in bed;with call bell/phone within reach;with bed alarm set;with family/visitor present   Nurse Communication Mobility status;Precautions        Time: 9211-9417 OT Time Calculation (min): 33 min  Charges: OT General Charges $OT  Visit: 1 Visit OT Treatments $Therapeutic Activity: 8-22 mins   Brynn, OTR/L  Acute Rehabilitation Services Pager: [phone] Office: [phone] .    Jeri Modena 06/25/2021, 12:32 PM

## 2021-06-25 NOTE — Progress Notes (Signed)
Inpatient Rehabilitation Admissions Coordinator   Inpatient rehab consult received. I spoke with patient by phone to discuss goals and expectations of a possible  CIR admit. Pt previously at [facility] after his initial surgery in 2019. He would prefer CIR again. I will follow up tomorrow for possible CIR admit pending acute MD clearance to admit tomorrow.  Danne Baxter, RN, MSN Rehab Admissions Coordinator [phone] 06/25/2021 1:11 PM

## 2021-06-25 NOTE — Progress Notes (Signed)
Patient ID: James Olsen, male   DOB: 04-09-1946, 75 y.o.   MRN: [id number] BP 117/66 (BP Location: Right Arm)   Pulse 66   Temp 98.2 F (36.8 C) (Oral)   Resp 14   Ht 5\' 9"  (1.753 m)   Wt 86.4 kg   SpO2 96%   BMI 28.13 kg/m  Alert and oriented x4,  Regaining strength in right upper extremity, bilateral lower extremities remain week Wound is clean and dry Excellent candidate for rehab

## 2021-06-25 NOTE — Progress Notes (Signed)
Physical Therapy Treatment Patient Details Name: James Olsen MRN: [id number] DOB: 1946/04/23 Today's Date: 06/25/2021    History of Present Illness Pt is a 75 y.o. M who presents s/p resection of recurrent parasagittal meningioma, requiring sacrifice of SSS. PMH: prior resection with craniotomy, meningioma.    PT Comments    Pt motivated to work with therapies. Pt continues to present with significant extensor tone in LE's in standing, PT facilitating breaking up extensor tone with exaggerated truncal flexion when moving between stand<>sit. Pt tolerated repeated sit<>stands, and multiple LE exercises, focus on hip and knee flexion activities. PT to continue to progress mobility as tolerated, CIR remains appropriate d/c.    Follow Up Recommendations  CIR     Equipment Recommendations  Wheelchair cushion (measurements PT);Wheelchair (measurements PT);3in1 (PT)    Recommendations for Other Services       Precautions / Restrictions Precautions Precautions: Fall;Other (comment) Precaution Comments: BLE extension hypertonicity Restrictions Weight Bearing Restrictions: No    Mobility  Bed Mobility Overal bed mobility: Needs Assistance Bed Mobility: Rolling;Supine to Sit;Sit to Supine Rolling: Mod assist   Supine to sit: Mod assist;+2 for physical assistance Sit to supine: +2 for physical assistance;Mod assist   General bed mobility comments: pt needs (A) to sequence rolling to the left side and where to place R UE. pt pulling toward rail on L side. pt requires (A) To bring  bil LE off EOB but activating LLE. pt needs (A) with trunk elevation. pt static sitting with guarding to prevent extension and sliding off bed surface. pt requires bil LE onto bed surface to return supine. pt with knee flexion for stretching and buttock bridging with therapist blockign at ankles    Transfers Overall transfer level: Needs assistance Equipment used: 2 person hand held assist Transfers:  Sit to/from Stand Sit to Stand: +2 physical assistance;Max assist         General transfer comment: pt completed sit<>stand 6 times during session - assist for power up, rise, steadying, breaking extensor tone. Lateral stepping x1 each direction  Ambulation/Gait                 Stairs             Wheelchair Mobility    Modified Rankin (Stroke Patients Only) Modified Rankin (Stroke Patients Only) Pre-Morbid Rankin Score: No symptoms Modified Rankin: Severe disability     Balance Overall balance assessment: Needs assistance Sitting-balance support: Feet supported;Single extremity supported Sitting balance-Leahy Scale: Fair Sitting balance - Comments: min guard to mod (A), preference for posterior leaning, Mod cuing for leaning trunk anteriorly   Standing balance support: Bilateral upper extremity supported;During functional activity Standing balance-Leahy Scale: Poor Standing balance comment: requires therapist weight shift patient and flexion facilitation                            Cognition Arousal/Alertness: Awake/alert Behavior During Therapy: WFL for tasks assessed/performed Overall Cognitive Status: Impaired/Different from baseline Area of Impairment: Problem solving                             Problem Solving: Slow processing General Comments: Pt with delayed responses to PT/OT questions, at times requires multimodal cuing for follow commands      Exercises Other Exercises Other Exercises: clam shells each side x15 reps, for hip and thigh stretching and strengthening, hold x5 seconds at end range Other Exercises:  bridging buttock x5 reps, PT and OT holding pt feet and blocking knees Other Exercises: weight bearing and knee flexion at EOB Other Exercises: lateral knee drop L/R in supine with hip/knee flexion, to break up truncal rigidity    General Comments General comments (skin integrity, edema, etc.): VSS       Pertinent Vitals/Pain Pain Assessment: No/denies pain Pain Intervention(s): Limited activity within patient's tolerance;Monitored during session    Home Living                      Prior Function            PT Goals (current goals can now be found in the care plan section) Acute Rehab PT Goals Patient Stated Goal: to get to moving two times a day while i am here PT Goal Formulation: With patient Time For Goal Achievement: 07/07/21 Potential to Achieve Goals: Good Progress towards PT goals: Progressing toward goals    Frequency    Min 4X/week      PT Plan Current plan remains appropriate    Co-evaluation PT/OT/SLP Co-Evaluation/Treatment: Yes Reason for Co-Treatment: For patient/therapist safety;To address functional/ADL transfers;Complexity of the patient's impairments (multi-system involvement) PT goals addressed during session: Mobility/safety with mobility;Strengthening/ROM OT goals addressed during session: ADL's and self-care;Proper use of Adaptive equipment and DME;Strengthening/ROM      AM-PAC PT "6 Clicks" Mobility   Outcome Measure  Help needed turning from your back to your side while in a flat bed without using bedrails?: A Lot Help needed moving from lying on your back to sitting on the side of a flat bed without using bedrails?: A Lot Help needed moving to and from a bed to a chair (including a wheelchair)?: A Lot Help needed standing up from a chair using your arms (e.g., wheelchair or bedside chair)?: A Lot Help needed to walk in hospital room?: Total Help needed climbing 3-5 steps with a railing? : Total 6 Click Score: 10    End of Session Equipment Utilized During Treatment: Gait belt Activity Tolerance: Patient tolerated treatment well Patient left: with call bell/phone within reach;in bed;with bed alarm set Nurse Communication: Mobility status PT Visit Diagnosis: Other abnormalities of gait and mobility (R26.89);Difficulty in walking,  not elsewhere classified (R26.2)     Time: 7253-6644 PT Time Calculation (min) (ACUTE ONLY): 33 min  Charges:  $Neuromuscular Re-education: 8-22 mins                     Stacie Glaze, PT DPT Acute Rehabilitation Services Pager [phone]  Office [phone]    Roxine Caddy E Ruffin Pyo 06/25/2021, 2:21 PM

## 2021-06-25 NOTE — PMR Pre-admission (Signed)
PMR Admission Coordinator Pre-Admission Assessment  Patient: James Olsen is an 75 y.o., male MRN: [id number] DOB: 1946-07-21 Height: 5\' 9"  (175.3 cm) Weight: 86.4 kg  Insurance Information HMO:     PPO:      PCP:      IPA:      80/20:      OTHER:  PRIMARY: Medicare a and b      Policy#: [id number]      Subscriber: pt Benefits:  Phone #: passport one source     Name: 7/5 Eff. Date: 09/21/2012     Deduct: $1556      Out of Pocket Max: none      Life Max: none CIR: 100%      SNF: 20 full days Outpatient: 80%     Co-Pay: 20% Home Health: 100%      Co-Pay: none DME: 80%     Co-Pay: 20% Providers: pt choice  SECONDARY: AARP supplement      Policy#: [id number]  Financial Counselor:       Phone#:   The "Data Collection Information Summary" for patients in Inpatient Rehabilitation Facilities with attached "Privacy Act [place] Records" was provided and verbally reviewed with: Patient  Emergency Contact Information Contact Information     Name Relation Home Work Mobile   [name] Spouse [phone]  [phone]       Current Medical History  Patient Admitting Diagnosis: resection of recurrent parasagittal meningioma  History of Present Illness: 75 year old RH male with history of CVA, macular degeneration, atypical meningioma RSXN and XRT (Dr. 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 He was admitted on 06/21/21 for resection of tumor with sacrifice of SSS with resultant RUE and BLE weakness post op. Baclofen added to manage spasticity and started on decadron taper. He is showing some improvement in BLE strength but continues to have significant extensor tone with standing attempts.   Patient's medical record from [facility] has been reviewed by the rehabilitation admission coordinator and physician.  Past Medical History  Past Medical History:  Diagnosis  Date   Arthritis    GERD (gastroesophageal reflux disease)    Glaucoma    pt denies.    History of blood transfusion     5 units after previous surgery   History of radiation therapy 12/09/18- 01/24/2019   Left sagittal brain, 11.8 Gy in 31 fractions for a total dose of 55.8 Gy   Macular degeneration    Stroke [facility])    after surgery in August 2019 mostly recovered still some weakness in left arm   Wears glasses     Family History   family history includes Congenital heart disease in his brother; Diabetes in his mother; Skin cancer in his brother.  Prior Rehab/Hospitalizations Has the patient had prior rehab or hospitalizations prior to admission? Yes  CIR 2019  Has the patient had major surgery during 100 days prior to admission? Yes   Current Medications  Current Facility-Administered Medications:    0.9 %  sodium chloride infusion, , Intravenous, PRN, Vallarie Mare, MD, Stopped at 06/22/21 1517   acetaminophen (TYLENOL) tablet 650 mg, 650 mg, Oral, Q4H PRN, 650 mg at 06/22/21 2035 **OR** acetaminophen (TYLENOL) suppository 650 mg, 650 mg, Rectal, Q4H PRN, Vallarie Mare, MD   baclofen (LIORESAL) tablet 10 mg, 10 mg, Oral, TID, Vallarie Mare, MD, 10 mg at 06/26/21 1021  bisacodyl (DULCOLAX) EC tablet 5 mg, 5 mg, Oral, Daily PRN, Vallarie Mare, MD, 5 mg at 06/24/21 1619   Chlorhexidine Gluconate Cloth 2 % PADS 6 each, 6 each, Topical, Daily, Vallarie Mare, MD, 6 each at 06/26/21 0905   dexamethasone (DECADRON) tablet 4 mg, 4 mg, Oral, Q8H, Vallarie Mare, MD, 4 mg at 06/26/21 0618   docusate sodium (COLACE) capsule 100 mg, 100 mg, Oral, BID, Vallarie Mare, MD, 100 mg at 06/26/21 1021   heparin injection 5,000 Units, 5,000 Units, Subcutaneous, Q8H, Vallarie Mare, MD, 5,000 Units at 06/26/21 0263   HYDROcodone-acetaminophen (NORCO/VICODIN) 5-325 MG per tablet 1 tablet, 1 tablet, Oral, Q4H PRN, Vallarie Mare, MD   labetalol (NORMODYNE)  injection 10-40 mg, 10-40 mg, Intravenous, Q10 min PRN, Vallarie Mare, MD, 20 mg at 06/21/21 1835   levETIRAcetam (KEPPRA) tablet 500 mg, 500 mg, Oral, BID, 500 mg at 06/26/21 1022 **OR** levETIRAcetam (KEPPRA) IVPB 500 mg/100 mL premix, 500 mg, Intravenous, BID, Vallarie Mare, MD   magnesium citrate solution 1 Bottle, 1 Bottle, Oral, Once PRN, Vallarie Mare, MD   morphine 2 MG/ML injection 1-2 mg, 1-2 mg, Intravenous, Q2H PRN, Vallarie Mare, MD   multivitamin (PROSIGHT) tablet 1 tablet, 1 tablet, Oral, Daily, Vallarie Mare, MD, 1 tablet at 06/26/21 1021   naloxone (NARCAN) injection 0.08 mg, 0.08 mg, Intravenous, PRN, Vallarie Mare, MD   ondansetron [facility]) tablet 4 mg, 4 mg, Oral, Q4H PRN **OR** ondansetron (ZOFRAN) injection 4 mg, 4 mg, Intravenous, Q4H PRN, Vallarie Mare, MD   pantoprazole (PROTONIX) EC tablet 40 mg, 40 mg, Oral, QHS, Vallarie Mare, MD, 40 mg at 06/22/21 2036   promethazine (PHENERGAN) tablet 12.5-25 mg, 12.5-25 mg, Oral, Q4H PRN, Vallarie Mare, MD   senna-docusate (Senokot-S) tablet 1 tablet, 1 tablet, Oral, QHS PRN, Vallarie Mare, MD  Patients Current Diet:  Diet Order             Diet regular Room service appropriate? Yes; Fluid consistency: Thin  Diet effective now                  Precautions / Restrictions Precautions Precautions: Fall, Other (comment) Precaution Comments: BLE extension hypertonicity Restrictions Weight Bearing Restrictions: No   Has the patient had 2 or more falls or a fall with injury in the past year? No  Prior Activity Level Community (5-7x/wk): Independent and active  Prior Functional Level Self Care: Did the patient need help bathing, dressing, using the toilet or eating? Independent  Indoor Mobility: Did the patient need assistance with walking from room to room (with or without device)? Independent  Stairs: Did the patient need assistance with internal or external stairs  (with or without device)? Independent  Functional Cognition: Did the patient need help planning regular tasks such as shopping or remembering to take medications? Independent  Home Assistive Devices / Equipment Home Assistive Devices/Equipment: Eyeglasses, Grab bars in shower Home Equipment: Shower seat  Prior Device Use: Indicate devices/aids used by the patient prior to current illness, exacerbation or injury? None of the above  Current Functional Level Cognition  Overall Cognitive Status: Impaired/Different from baseline Orientation Level: Oriented X4 General Comments: Pt with delayed responses to PT questions, at times requires multimodal cuing for follow commands. Increased time to process cues.    Extremity Assessment (includes Sensation/Coordination)  Upper Extremity Assessment: RUE deficits/detail RUE Deficits / Details: 4 out 5 but with fine motor deficits PTA  RUE Coordination: decreased fine motor LUE Deficits / Details: Strength 5/5  Lower Extremity Assessment: RLE deficits/detail, LLE deficits/detail RLE Deficits / Details: Hypertonicity going into knee extension and ankle plantarflexion; able to reduce with aggressive passive ROM LLE Deficits / Details: Hypertonicity going into knee extension and ankle plantarflexion; able to reduce with aggressive passive ROM    ADLs  Overall ADL's : Needs assistance/impaired Eating/Feeding: Set up, Bed level Grooming: Set up, Bed level Lower Body Bathing: Total assistance Lower Body Bathing Details (indicate cue type and reason): rolling r for peri hygiene Lower Body Dressing: Total assistance Toilet Transfer: +2 for physical assistance, Maximal assistance Toilet Transfer Details (indicate cue type and reason): simulate sit<>STand from EOB. General ADL Comments: pt completing mini squats at the eob to try to use flexion to help with tone with transfer. pt able to move L LE with therapist weight shifting patient however RLE just  adducts to LLE due to tone. Pt requires multiple attempts at sit<>Stand to get pt foward flexion enough to keep some knee flexion and not just full knee extension    Mobility  Overal bed mobility: Needs Assistance Bed Mobility: Rolling, Sidelying to Sit Rolling: Mod assist Sidelying to sit: Mod assist, +2 for safety/equipment, HOB elevated Supine to sit: Mod assist, +2 for physical assistance Sit to supine: +2 for physical assistance, Mod assist General bed mobility comments: Cued pt to roll to side to facilitate hip and knee flexion, with increased ease compared to attempt in supine. ModA to roll and flex legs with blocking to maintain this position with transition to sit EOB.    Transfers  Overall transfer level: Needs assistance Equipment used: Rolling walker (2 wheeled) Transfer via Lift Equipment: Stedy Transfers: Sit to/from Stand, Risk manager Sit to Stand: +2 physical assistance, Mod assist Stand pivot transfers: Max assist, +2 physical assistance General transfer comment: Sit to stand 1x from low level EOB and 2x from recliner, cuing pt to keep trunk flexed and push up from current sitting surface, modAx2 to power up and steady. MaxAx2 to stand step to L with RW, needing cues and assistance to weight shift and advance each leg. Pt tends to have more difficulty flexing the R leg with stepping. RW placed at low height to encourage tunk flexion and reduce leg extension hypertonicity.    Ambulation / Gait / Stairs / Wheelchair Mobility  Ambulation/Gait Ambulation/Gait assistance: Max assist, +2 physical assistance Gait Distance (Feet): 2 Feet Assistive device: Rolling walker (2 wheeled) Gait Pattern/deviations: Step-to pattern, Decreased step length - right, Decreased step length - left, Decreased stride length, Decreased dorsiflexion - right, Decreased dorsiflexion - left, Decreased weight shift to right, Decreased weight shift to left, Scissoring, Leaning posteriorly, Trunk  flexed General Gait Details: RW placed at low height to encourage tunk flexion and thus decrease bil lower extremity hypertonicity. Pt able to flex L hip and knee slightly to clear foot to take step laterally or march in place, but pt maintains R knee in extension and needs extensive physical assistance to break the hypertonicity. Pt with bil ankle plantarflexion (L>R). Excessive hip adduction with marching in place, likely to compensate for lack of isolated hip flexion, resulting in narrow or scissoring steps. x1 bout stepping to L to chair and x1 bout marching in place anterior to recliner. Gait velocity: reduced Gait velocity interpretation: <1.31 ft/sec, indicative of household ambulator    Posture / Balance Dynamic Sitting Balance Sitting balance - Comments: min guard to mod (A), preference for  posterior leaning, Mod cuing for leaning trunk anteriorly Balance Overall balance assessment: Needs assistance Sitting-balance support: Feet supported, Single extremity supported Sitting balance-Leahy Scale: Poor Sitting balance - Comments: min guard to mod (A), preference for posterior leaning, Mod cuing for leaning trunk anteriorly Standing balance support: Bilateral upper extremity supported, During functional activity Standing balance-Leahy Scale: Poor Standing balance comment: Requires UE support and external physical assistance.    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 2 cats ( glove and ginger)  Discharge Living Setting Plans for Discharge Living Setting: Patient's home, Lives with (comment) (spouse) Type of Home at Discharge: House Discharge Home Layout: Able to live on main level with bedroom/bathroom Discharge Home Access: Stairs to enter Entrance Stairs-Rails: Right, Left Entrance Stairs-Number of Steps: 3 Discharge Bathroom Shower/Tub: Tub/shower unit, Walk-in shower Discharge Bathroom Toilet: Standard Discharge Bathroom Accessibility: Yes How Accessible: Accessible via walker Does the patient have any problems obtaining your medications?: No  Social/Family/Support Systems Patient Roles: Spouse Contact Information: wife Anticipated Caregiver: wife Anticipated Caregiver's Contact Information: see above Ability/Limitations of Caregiver: no limitations noted Caregiver Availability: 24/7 Discharge Plan Discussed with Primary Caregiver: Yes Is Caregiver In Agreement with Plan?: Yes Does Caregiver/Family have Issues with Lodging/Transportation while Pt is in Rehab?: No  Goals Patient/Family Goal for Rehab: supervision to min asisst with PT and OT Expected length of stay: ELOS 2 to 3 weeks Pt/Family Agrees to Admission and willing to participate: Yes Program Orientation Provided & Reviewed with Pt/Caregiver Including Roles  & Responsibilities: Yes  Decrease burden of Care through IP rehab admission: n/a  Possible need for SNF placement upon discharge: not anticipated  Patient Condition: I have reviewed medical records from [facility], spoken with CM, and patient. I discussed via phone for inpatient rehabilitation assessment.  Patient will benefit from ongoing PT and OT, can actively participate in 3 hours of therapy a day 5 days of the week, and can make measurable gains during the admission.  Patient will also benefit from the coordinated team approach during an Inpatient Acute Rehabilitation  admission.  The patient will receive intensive therapy as well as Rehabilitation physician, nursing, social worker, and care management interventions.  Due to bladder management, bowel management, safety, skin/wound care, disease management, medication administration, pain management, and patient education the patient requires 24 hour a day rehabilitation nursing.  The patient is currently max assist overall with mobility and basic ADLs.  Discharge setting and therapy post discharge at home with outpatient is anticipated.  Patient has agreed to participate in the Acute Inpatient Rehabilitation Program and will admit today.  Preadmission Screen Completed By:  Cleatrice Burke, 06/26/2021 12:14 PM ______________________________________________________________________   Discussed status with Dr. Letta Pate on 06/26/2021 at 1214 and received approval for admission today.  Admission Coordinator:  Cleatrice Burke, RN, time 2440 Date 06/26/2021   Assessment/Plan: Diagnosis:Bifrontal meningioma recurrence Does the need for close, 24 hr/day Medical supervision in concert with the patient's rehab needs make it unreasonable for this patient to be served in a less intensive setting? Yes Co-Morbidities requiring supervision/potential  complications: Paraparesis, spasticity Due to bladder management, bowel management, safety, skin/wound care, disease management, medication administration, pain management, and patient education, does the patient require 24 hr/day rehab nursing? Yes Does the patient require coordinated care of a physician, rehab nurse, PT, OT, and SLP to address physical and functional deficits in the context of the above medical diagnosis(es)? Yes Addressing deficits in the following areas: balance, endurance, locomotion, strength, transferring, bowel/bladder control, bathing, dressing, feeding, toileting, cognition, and psychosocial support Can the patient actively participate in an  intensive therapy program of at least 3 hrs of therapy 5 days a week? Yes The potential for patient to make measurable gains while on inpatient rehab is good Anticipated functional outcomes upon discharge from inpatient rehab: min assist PT, min assist OT, modified independent SLP Estimated rehab length of stay to reach the above functional goals is: 2-3wks Anticipated discharge destination: Home 10. Overall Rehab/Functional Prognosis: good   MD Signature: Charlett Blake M.D. [facility] [place] [facility] Fellow Am Acad of Phys Med and Rehab Diplomate Am Board of Electrodiagnostic Med Fellow Am Board of Interventional Pain

## 2021-06-26 ENCOUNTER — Other Ambulatory Visit: Payer: Self-pay

## 2021-06-26 ENCOUNTER — Inpatient Hospital Stay (HOSPITAL_COMMUNITY): Payer: Medicare Other

## 2021-06-26 ENCOUNTER — Inpatient Hospital Stay (HOSPITAL_COMMUNITY)
Admission: RE | Admit: 2021-06-26 | Discharge: 2021-07-18 | DRG: 945 | Disposition: A | Discharge status: Home Health | Payer: Medicare Other | Source: Intra-hospital | Attending: Physical Medicine & Rehabilitation | Admitting: Physical Medicine & Rehabilitation

## 2021-06-26 ENCOUNTER — Encounter (HOSPITAL_COMMUNITY): Payer: Self-pay | Admitting: Physical Medicine & Rehabilitation

## 2021-06-26 DIAGNOSIS — Z808 Family history of malignant neoplasm of other organs or systems: Secondary | ICD-10-CM

## 2021-06-26 DIAGNOSIS — R7401 Elevation of levels of liver transaminase levels: Secondary | ICD-10-CM | POA: Diagnosis present

## 2021-06-26 DIAGNOSIS — K5900 Constipation, unspecified: Secondary | ICD-10-CM | POA: Diagnosis present

## 2021-06-26 DIAGNOSIS — I69398 Other sequelae of cerebral infarction: Secondary | ICD-10-CM

## 2021-06-26 DIAGNOSIS — Z9889 Other specified postprocedural states: Secondary | ICD-10-CM

## 2021-06-26 DIAGNOSIS — D696 Thrombocytopenia, unspecified: Secondary | ICD-10-CM | POA: Diagnosis present

## 2021-06-26 DIAGNOSIS — Z298 Encounter for other specified prophylactic measures: Secondary | ICD-10-CM

## 2021-06-26 DIAGNOSIS — R197 Diarrhea, unspecified: Secondary | ICD-10-CM

## 2021-06-26 DIAGNOSIS — R739 Hyperglycemia, unspecified: Secondary | ICD-10-CM | POA: Diagnosis present

## 2021-06-26 DIAGNOSIS — I495 Sick sinus syndrome: Secondary | ICD-10-CM | POA: Diagnosis present

## 2021-06-26 DIAGNOSIS — D420 Neoplasm of uncertain behavior of cerebral meninges: Secondary | ICD-10-CM | POA: Diagnosis present

## 2021-06-26 DIAGNOSIS — Z833 Family history of diabetes mellitus: Secondary | ICD-10-CM | POA: Diagnosis not present

## 2021-06-26 DIAGNOSIS — D329 Benign neoplasm of meninges, unspecified: Secondary | ICD-10-CM

## 2021-06-26 DIAGNOSIS — K592 Neurogenic bowel, not elsewhere classified: Secondary | ICD-10-CM | POA: Diagnosis present

## 2021-06-26 DIAGNOSIS — R350 Frequency of micturition: Secondary | ICD-10-CM | POA: Diagnosis present

## 2021-06-26 DIAGNOSIS — R252 Cramp and spasm: Secondary | ICD-10-CM | POA: Diagnosis not present

## 2021-06-26 DIAGNOSIS — T380X5A Adverse effect of glucocorticoids and synthetic analogues, initial encounter: Secondary | ICD-10-CM | POA: Diagnosis present

## 2021-06-26 DIAGNOSIS — R339 Retention of urine, unspecified: Secondary | ICD-10-CM | POA: Diagnosis present

## 2021-06-26 DIAGNOSIS — Z923 Personal history of irradiation: Secondary | ICD-10-CM

## 2021-06-26 DIAGNOSIS — K219 Gastro-esophageal reflux disease without esophagitis: Secondary | ICD-10-CM | POA: Diagnosis present

## 2021-06-26 DIAGNOSIS — D62 Acute posthemorrhagic anemia: Secondary | ICD-10-CM | POA: Diagnosis present

## 2021-06-26 DIAGNOSIS — M25561 Pain in right knee: Secondary | ICD-10-CM | POA: Diagnosis present

## 2021-06-26 DIAGNOSIS — K5901 Slow transit constipation: Secondary | ICD-10-CM | POA: Diagnosis not present

## 2021-06-26 DIAGNOSIS — R5381 Other malaise: Principal | ICD-10-CM | POA: Diagnosis present

## 2021-06-26 DIAGNOSIS — R7402 Elevation of levels of lactic acid dehydrogenase (LDH): Secondary | ICD-10-CM | POA: Diagnosis present

## 2021-06-26 DIAGNOSIS — N319 Neuromuscular dysfunction of bladder, unspecified: Secondary | ICD-10-CM | POA: Diagnosis present

## 2021-06-26 DIAGNOSIS — Z2989 Encounter for other specified prophylactic measures: Secondary | ICD-10-CM

## 2021-06-26 DIAGNOSIS — Z86018 Personal history of other benign neoplasm: Secondary | ICD-10-CM

## 2021-06-26 MED ORDER — PROCHLORPERAZINE 25 MG RE SUPP: 12.5000 mg | Freq: Four times a day (QID) | RECTAL | Status: DC | PRN

## 2021-06-26 MED ORDER — DEXAMETHASONE 4 MG PO TABS
4.0000 mg | ORAL_TABLET | Freq: Three times a day (TID) | ORAL | Status: DC
  Administered 2021-06-26 – 2021-06-28 (×5): 4 mg via ORAL
  Filled 2021-06-26 (×5): qty 1

## 2021-06-26 MED ORDER — ENOXAPARIN SODIUM 40 MG/0.4ML IJ SOSY: 40.0000 mg | PREFILLED_SYRINGE | INTRAMUSCULAR | Status: DC

## 2021-06-26 MED ORDER — HYDROCODONE-ACETAMINOPHEN 5-325 MG PO TABS: 1.0000 | ORAL_TABLET | ORAL | Status: DC | PRN

## 2021-06-26 MED ORDER — FLEET ENEMA 7-19 GM/118ML RE ENEM: 1.0000 | ENEMA | Freq: Once | RECTAL | Status: DC | PRN

## 2021-06-26 MED ORDER — BACLOFEN 5 MG HALF TABLET: 5.0000 mg | ORAL_TABLET | Freq: Three times a day (TID) | ORAL | Status: DC | PRN

## 2021-06-26 MED ORDER — DIPHENHYDRAMINE HCL 12.5 MG/5ML PO ELIX: 12.5000 mg | ORAL_SOLUTION | Freq: Four times a day (QID) | ORAL | Status: DC | PRN

## 2021-06-26 MED ORDER — LEVETIRACETAM 500 MG PO TABS
500.0000 mg | ORAL_TABLET | Freq: Two times a day (BID) | ORAL | Status: DC
  Administered 2021-06-26 – 2021-07-18 (×44): 500 mg via ORAL
  Filled 2021-06-26 (×44): qty 1

## 2021-06-26 MED ORDER — ALUM & MAG HYDROXIDE-SIMETH 200-200-20 MG/5ML PO SUSP: 30.0000 mL | ORAL | Status: DC | PRN

## 2021-06-26 MED ORDER — PROSIGHT PO TABS
1.0000 | ORAL_TABLET | Freq: Every day | ORAL | Status: DC
  Administered 2021-06-27 – 2021-07-18 (×22): 1 via ORAL
  Filled 2021-06-26 (×22): qty 1

## 2021-06-26 MED ORDER — PROCHLORPERAZINE EDISYLATE 10 MG/2ML IJ SOLN: 5.0000 mg | Freq: Four times a day (QID) | INTRAMUSCULAR | Status: DC | PRN

## 2021-06-26 MED ORDER — HEPARIN SODIUM (PORCINE) 5000 UNIT/ML IJ SOLN: 5000.0000 [IU] | Freq: Three times a day (TID) | INTRAMUSCULAR | Status: DC

## 2021-06-26 MED ORDER — DOCUSATE SODIUM 100 MG PO CAPS: 100.0000 mg | ORAL_CAPSULE | Freq: Two times a day (BID) | ORAL | Status: DC

## 2021-06-26 MED ORDER — GUAIFENESIN-DM 100-10 MG/5ML PO SYRP: 5.0000 mL | ORAL_SOLUTION | Freq: Four times a day (QID) | ORAL | Status: DC | PRN

## 2021-06-26 MED ORDER — NALOXONE HCL 0.4 MG/ML IJ SOLN: 0.0800 mg | INTRAMUSCULAR | Status: DC | PRN

## 2021-06-26 MED ORDER — POLYETHYLENE GLYCOL 3350 17 G PO PACK
17.0000 g | PACK | Freq: Every day | ORAL | Status: DC | PRN
  Administered 2021-06-29: 17 g via ORAL
  Filled 2021-06-26: qty 1

## 2021-06-26 MED ORDER — BISACODYL 10 MG RE SUPP: 10.0000 mg | Freq: Every day | RECTAL | Status: DC | PRN

## 2021-06-26 MED ORDER — HEPARIN SODIUM (PORCINE) 5000 UNIT/ML IJ SOLN
5000.0000 [IU] | Freq: Three times a day (TID) | INTRAMUSCULAR | Status: DC
  Administered 2021-06-26 – 2021-07-18 (×65): 5000 [IU] via SUBCUTANEOUS
  Filled 2021-06-26 (×66): qty 1

## 2021-06-26 MED ORDER — BACLOFEN 10 MG PO TABS
10.0000 mg | ORAL_TABLET | Freq: Three times a day (TID) | ORAL | Status: DC
  Administered 2021-06-26 – 2021-07-01 (×14): 10 mg via ORAL
  Filled 2021-06-26 (×14): qty 1

## 2021-06-26 MED ORDER — TRAZODONE HCL 50 MG PO TABS
25.0000 mg | ORAL_TABLET | Freq: Every evening | ORAL | Status: DC | PRN
  Administered 2021-07-08 – 2021-07-17 (×6): 50 mg via ORAL
  Filled 2021-06-26 (×7): qty 1

## 2021-06-26 MED ORDER — PANTOPRAZOLE SODIUM 40 MG PO TBEC
40.0000 mg | DELAYED_RELEASE_TABLET | Freq: Every day | ORAL | Status: DC
  Administered 2021-06-26 – 2021-07-17 (×18): 40 mg via ORAL
  Filled 2021-06-26 (×22): qty 1

## 2021-06-26 MED ORDER — PROCHLORPERAZINE MALEATE 5 MG PO TABS: 5.0000 mg | ORAL_TABLET | Freq: Four times a day (QID) | ORAL | Status: DC | PRN

## 2021-06-26 MED ORDER — ACETAMINOPHEN 325 MG PO TABS
325.0000 mg | ORAL_TABLET | ORAL | Status: DC | PRN
  Administered 2021-07-03 – 2021-07-16 (×4): 650 mg via ORAL
  Filled 2021-06-26 (×4): qty 2

## 2021-06-26 NOTE — Discharge Summary (Signed)
Physician Discharge Summary  Patient ID: James Olsen MRN: [id number] DOB/AGE: 03/18/1946 75 y.o.  Admit date: 06/21/2021 Discharge date: 06/26/2021  Admission Diagnoses:falcine meningioma  Discharge Diagnoses: falcine meningioma, who grade l Active Problems:   Meningioma determined by biopsy of brain [facility])   S/P craniotomy   Discharged Condition: fair  Hospital Course: Mr. Cueva was admitted for a second resection of his meningioma which at this time has occluded his sinus. Post op a gross total resection he was left with bilateral lower extremity weakness, and weakness in the right upper extremity. Both lower extremities and the right upper extremity have improved. His wound is clean, dry, and without signs of infection.   Treatments: surgery: Redo craniotomy for tumor resection  Discharge Exam: Blood pressure 121/69, pulse 60, temperature 97.7 F (36.5 C), temperature source Oral, resp. rate 16, height 5\' 9"  (1.753 m), weight 86.4 kg, SpO2 97 %. Alert and oriented x4, speech is clear and fluent Wound is clean, dry, and without signs of infection Weakness in the lower extremities bilaterally. Weakness in the right upper extremity  Tolerating a diet, voiding, ambulating with max assist.    Disposition: Discharge disposition: [place] Not Defined      Meningioma  Allergies as of 06/26/2021   No Known Allergies      Medication List     TAKE these medications    levETIRAcetam 500 MG tablet Commonly known as: Keppra Take 1 tablet (500 mg total) by mouth 2 (two) times daily.   PreserVision AREDS 2 Caps Take 1 capsule by mouth 2 (two) times daily.   Red Yeast Rice Extract 600 MG Caps Take 1,200 mg by mouth daily.        Follow-up Information     [name], MD Follow up in 3 day(s).   Specialty: Neurosurgery Why: please call to make an appointment Contact information: [address] [place]  [address] [phone]                 Signed: [name] 06/26/2021, 1:46 PM

## 2021-06-26 NOTE — Op Note (Signed)
06/21/2021  6:37 PM  PATIENT:  James Olsen  75 y.o. male with a meningioma partially resected my me previously.  PRE-OPERATIVE DIAGNOSIS:  Meningioma  POST-OPERATIVE DIAGNOSIS:  Meningioma  PROCEDURE:  Procedure(s): Bilateral Craniotomy for tumor resection APPLICATION OF CRANIAL NAVIGATION  SURGEON: Surgeon(s): [name], MD Consuella Lose, MD  ASSISTANTS:Nundkumar, Nena Polio  ANESTHESIA:   general  EBL:  Total I/O In: 39 [P.O.:800] Out: 600 [Urine:600]  BLOOD ADMINISTERED:none  CELL SAVER GIVEN:none  COUNT:per nursing  DRAINS: none   SPECIMEN:  Source of Specimen:  intracranial space  DICTATION: James Olsen was taken to the operating room, intubated, and placed under a general anesthetic without difficulty. Once adequate anesthesia was obtained his head was placed in a three pin head holder, and attached to the OR table with an adapter. He was positioned supine. I localized his head with the stereotactic system using the preoperative scans. Once localization was completed we started the prep.  His head was shaved was prepped and was draped in a sterile manner.  I infiltrated the planned incision with lidocaine. I opened the coronal incision, placed raney clips, and used [place] retractores to expose the skull. I used the navigation to plan the craniotomy to ensure proper and adequate exposure for the resection.  I created burr holes, and incorporated the previous cranioplasty into the exposure. I crossed the sinus rostrally and caudally without great difficulty. I exposed the dura. I gauged the extent of sinus I needed to remove along with tumor. I ligated the superior sagittal sinus by placing a suture through the three flaps of dura surrounding the sinus close to each other, and using scissors to divide the dura between the sutures.  Dr. Kathyrn Sheriff and I then dissected and resected the mass over time. Planes between the tumor and brain were identified and  developed bilaterally. We used patties to protect the brain during the resection. We finally reached the rostral end of the tumor, and occluded sinus. We divided the sinus, and there was no back bleeding. The sinus was closed with a whipstitch. We delivered the tumor and involved dura and had it sent to pathology. We achieved hemostasis in the brain. Irrigated copiously. We closed the dura with a dural substitute. We approximated the skull with plates and screws. The galea approximated with suture, and the scalp with staples. I applied a sterile dressing. His head was removed from the [place].  He was extubated with ease and taken to the PACU.  PLAN OF CARE: Admit to inpatient   PATIENT DISPOSITION:  PACU - hemodynamically stable.   Delay start of Pharmacological VTE agent (>24hrs) due to surgical blood loss or risk of bleeding:  yes

## 2021-06-26 NOTE — Progress Notes (Signed)
Inpatient Rehabilitation Admissions Coordinator    I contacted Dr Christella Noa by phone and he is in agreement to d/c patient to CIR today. I contacted patient by phone and he is in agreement. I will alert acute team, TOC and make the arrangements to admit today.  Danne Baxter, RN, MSN Rehab Admissions Coordinator [phone] 06/26/2021 12:08 PM

## 2021-06-26 NOTE — Progress Notes (Signed)
INPATIENT REHABILITATION ADMISSION NOTE   Arrival Method:stretcher from 4 N     Mental Orientation: A+Ox4 with mild delay   Assessment: performed per flowsheet upon admission   Skin: craniotomy -staples noted to incision. CDI   IV'S: none   Pain: none reported   Tubes and Drains: none   Safety Measures: bed alarm on, 3 side rails up, call bell w/in reach,non skid socks on pt   Vital Signs: refer to flowsheet   Height and Weight:5'9", 88.1 kg   Rehab Orientation: OT and PT    Family: not at bedside    Notes: PT resting in bed, no complaints of pain or discomfort at this time, call bell in reach. Will continue to monitor

## 2021-06-26 NOTE — Discharge Instructions (Signed)
Craniotomy °Care After °Please read the instructions outlined below and refer to this sheet in the next few weeks. These discharge instructions provide you with general information on caring for yourself after you leave the hospital. Your surgeon may also give you specific instructions. While your treatment has been planned according to the most current medical practices available, unavoidable complications occasionally occur. If you have any problems or questions after discharge, please call your surgeon. °Although there are many types of brain surgery, recovery following craniotomy (surgical opening of the skull) is much the same for each. However, recovery depends on many factors. These include the type and severity of brain injury and the type of surgery. It also depends on any nervous system function problems (neurological deficits) before surgery. If the craniotomy was done for cancer, chemotherapy and radiation could follow. You could be in the hospital from 5 days to a couple weeks. This depends on the type of surgery, findings, and whether there are complications. °HOME CARE INSTRUCTIONS  °· It is not unusual to hear a clicking noise after a craniotomy, the plates and screws used to attach the bone flap can sometimes cause this. It is a normal occurrence if this does happen °· Do not drive for 10 days after the operation °· Your scalp may feel spongy for a while, because of fluid under it. This will gradually get better. Occasionally, the surgeon will not replace the bone that was removed to access the brain. If there is a bony defect, the surgeon will ask you to wear a helmet for protection. This is a discussion you should have with your surgeon prior to leaving the hospital (discharge). °· Numbness may persist in some areas of your scalp. °· Take all medications as directed. Sometimes steroids to control swelling are prescribed. Anticonvulsants to prevent seizures may also be given. Do not use alcohol,  other drugs, or medications unless your surgeon says it is OK. °· Keep the wound dry and clean. The wound may be washed gently with soap and water. Then, you may gently blot or dab it dry, without rubbing. Do not take baths, use swimming pools or hot tubs for 10 days, or as instructed by your caregiver. It is best to wait to see you surgeon at your first postoperative visit, and to get directions at that time. °· Only take over-the-counter or prescription medicines for pain, discomfort, or fever as directed by your caregiver. °· You may continue your normal diet, as directed. °· Walking is OK for exercise. Wait at least 3 months before you return to mild, non-contact sports or as your surgeon suggests. Contact sports should be avoided for at least 1 year, unless your surgeon says it is OK. °· If you are prescribed steroids, take them exactly as prescribed. If you start having a decrease in nervous system functions (neurological deficits) and headaches as the dose of steroids is reduced, tell your surgeon right away. °· When the anticonvulsant prescription is finished you no longer need to take it. °SEEK IMMEDIATE MEDICAL CARE IF:  °· You develop nausea, vomiting, severe headaches, confusion, or you have a seizure. °· You develop chest pain, a stiff neck, or difficulty breathing. °· There is redness, swelling, or increasing pain in the wound or pin insertion sites. °· You have an increase in swelling or bruising around the eyes. °· There is drainage or pus coming from the wound. °· You have an oral temperature above 102° F (38.9° C), not controlled by medicine. °·   You notice a foul smell coming from the wound or dressing. °· The wound breaks open (edges not staying together) after the stitches have been removed. °· You develop dizziness or fainting while standing. °· You develop a rash. °· You develop any reaction or side effects to the medications given. °Document Released: 03/10/2006 Document Revised: 03/01/2012  Document Reviewed: 12/17/2009 °[employer]® Patient Information ©2013 [employer]. ° °

## 2021-06-26 NOTE — Progress Notes (Signed)
Charlett Blake, MD   Physician  Physical Medicine and Rehabilitation  PMR Pre-admission     Signed  Date of Service:  06/25/2021  2:16 PM       Related encounter: Admission (Discharged) from 06/21/2021 in [place]          Show:Clear all [x] Written[x] Templated[x] Copied  Added by: [x] Cristina Gong, RN[x] Kirsteins, Luanna Salk, MD   [] Hover for details                                                                                                                                                                                                                                                                                                                                                                                                                                                                          PMR Admission Coordinator Pre-Admission Assessment   Patient: James Olsen is an 75 y.o., male MRN: [id number] DOB: 07/24/1946 Height: 5\' 9"  (175.3 cm) Weight: 86.4 kg   Insurance Information HMO:     PPO:      PCP:      IPA:      80/20:  OTHER: PRIMARY: Medicare a and b      Policy#: [id number]      Subscriber: pt Benefits:  Phone #: passport one source     Name: 7/5 Eff. Date: 09/21/2012     Deduct: $1556      Out of Pocket Max: none      Life Max: none CIR: 100%      SNF: 20 full days Outpatient: 80%     Co-Pay: 20% Home Health: 100%      Co-Pay: none DME: 80%     Co-Pay: 20% Providers: pt choice  SECONDARY: AARP supplement      Policy#: [id number]   Financial Counselor:       Phone#:   The "Data Collection Information Summary" for patients in Inpatient Rehabilitation Facilities with attached "Privacy Act [place] Records" was provided and verbally reviewed  with: Patient   Emergency Contact Information Contact Information       Name Relation Home Work Mobile    [place] Spouse [phone]   [phone]           Current Medical History  Patient Admitting Diagnosis: resection of recurrent parasagittal meningioma   History of Present Illness: 75 year old RH male with history of CVA, macular degeneration, atypical meningioma RSXN and XRT (Dr. 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 He was admitted on 06/21/21 for resection of tumor with sacrifice of SSS with resultant RUE and BLE weakness post op. Baclofen added to manage spasticity and started on decadron taper. He is showing some improvement in BLE strength but continues to have significant extensor tone with standing attempts.    Patient's medical record from [facility] has been reviewed by the rehabilitation admission coordinator and physician.   Past Medical History      Past Medical History:  Diagnosis Date   Arthritis     GERD (gastroesophageal reflux disease)     Glaucoma      pt denies.   History of blood transfusion       5 units after previous surgery   History of radiation therapy 12/09/18- 01/24/2019    Left sagittal brain, 11.8 Gy in 31 fractions for a total dose of 55.8 Gy   Macular degeneration     Stroke [facility])      after surgery in August 2019 mostly recovered still some weakness in left arm   Wears glasses        Family History   family history includes Congenital heart disease in his brother; Diabetes in his mother; Skin cancer in his brother.   Prior Rehab/Hospitalizations Has the patient had prior rehab or hospitalizations prior to admission? Yes  CIR 2019   Has the patient had major surgery during 100 days prior to admission? Yes              Current Medications   Current Facility-Administered Medications:   0.9 %  sodium chloride infusion, ,  Intravenous, PRN, Vallarie Mare, MD, Stopped at 06/22/21 1517   acetaminophen (TYLENOL) tablet 650 mg, 650 mg, Oral, Q4H PRN, 650 mg at 06/22/21 2035 **OR** acetaminophen (TYLENOL) suppository 650 mg, 650 mg, Rectal, Q4H PRN, Vallarie Mare, MD   baclofen (LIORESAL) tablet 10 mg, 10 mg, Oral, TID, Vallarie Mare, MD, 10 mg at 06/26/21 1021   bisacodyl (DULCOLAX) EC tablet 5 mg, 5 mg, Oral, Daily PRN, Vallarie Mare, MD, 5  mg at 06/24/21 1619   Chlorhexidine Gluconate Cloth 2 % PADS 6 each, 6 each, Topical, Daily, Vallarie Mare, MD, 6 each at 06/26/21 0905   dexamethasone (DECADRON) tablet 4 mg, 4 mg, Oral, Q8H, Vallarie Mare, MD, 4 mg at 06/26/21 0618   docusate sodium (COLACE) capsule 100 mg, 100 mg, Oral, BID, Vallarie Mare, MD, 100 mg at 06/26/21 1021   heparin injection 5,000 Units, 5,000 Units, Subcutaneous, Q8H, Vallarie Mare, MD, 5,000 Units at 06/26/21 1610   HYDROcodone-acetaminophen (NORCO/VICODIN) 5-325 MG per tablet 1 tablet, 1 tablet, Oral, Q4H PRN, Vallarie Mare, MD   labetalol (NORMODYNE) injection 10-40 mg, 10-40 mg, Intravenous, Q10 min PRN, Vallarie Mare, MD, 20 mg at 06/21/21 1835   levETIRAcetam (KEPPRA) tablet 500 mg, 500 mg, Oral, BID, 500 mg at 06/26/21 1022 **OR** levETIRAcetam (KEPPRA) IVPB 500 mg/100 mL premix, 500 mg, Intravenous, BID, Vallarie Mare, MD   magnesium citrate solution 1 Bottle, 1 Bottle, Oral, Once PRN, Vallarie Mare, MD   morphine 2 MG/ML injection 1-2 mg, 1-2 mg, Intravenous, Q2H PRN, Vallarie Mare, MD   multivitamin (PROSIGHT) tablet 1 tablet, 1 tablet, Oral, Daily, Vallarie Mare, MD, 1 tablet at 06/26/21 1021   naloxone (NARCAN) injection 0.08 mg, 0.08 mg, Intravenous, PRN, Vallarie Mare, MD   ondansetron [facility]) tablet 4 mg, 4 mg, Oral, Q4H PRN **OR** ondansetron (ZOFRAN) injection 4 mg, 4 mg, Intravenous, Q4H PRN, Vallarie Mare, MD   pantoprazole (PROTONIX) EC tablet 40 mg, 40  mg, Oral, QHS, Vallarie Mare, MD, 40 mg at 06/22/21 2036   promethazine (PHENERGAN) tablet 12.5-25 mg, 12.5-25 mg, Oral, Q4H PRN, Vallarie Mare, MD   senna-docusate (Senokot-S) tablet 1 tablet, 1 tablet, Oral, QHS PRN, Vallarie Mare, MD   Patients Current Diet:  Diet Order                  Diet regular Room service appropriate? Yes; Fluid consistency: Thin  Diet effective now                       Precautions / Restrictions Precautions Precautions: Fall, Other (comment) Precaution Comments: BLE extension hypertonicity Restrictions Weight Bearing Restrictions: No    Has the patient had 2 or more falls or a fall with injury in the past year? No   Prior Activity Level Community (5-7x/wk): Independent and active   Prior Functional Level Self Care: Did the patient need help bathing, dressing, using the toilet or eating? Independent   Indoor Mobility: Did the patient need assistance with walking from room to room (with or without device)? Independent   Stairs: Did the patient need assistance with internal or external stairs (with or without device)? Independent   Functional Cognition: Did the patient need help planning regular tasks such as shopping or remembering to take medications? Independent   Home Assistive Devices / Equipment Home Assistive Devices/Equipment: Eyeglasses, Grab bars in shower Home Equipment: Shower seat   Prior Device Use: Indicate devices/aids used by the patient prior to current illness, exacerbation or injury? None of the above   Current Functional Level Cognition   Overall Cognitive Status: Impaired/Different from baseline Orientation Level: Oriented X4 General Comments: Pt with delayed responses to PT questions, at times requires multimodal cuing for follow commands. Increased time to process cues.    Extremity Assessment (includes Sensation/Coordination)   Upper Extremity Assessment: RUE deficits/detail RUE Deficits / Details: 4  out 5  but with fine motor deficits PTA RUE Coordination: decreased fine motor LUE Deficits / Details: Strength 5/5  Lower Extremity Assessment: RLE deficits/detail, LLE deficits/detail RLE Deficits / Details: Hypertonicity going into knee extension and ankle plantarflexion; able to reduce with aggressive passive ROM LLE Deficits / Details: Hypertonicity going into knee extension and ankle plantarflexion; able to reduce with aggressive passive ROM     ADLs   Overall ADL's : Needs assistance/impaired Eating/Feeding: Set up, Bed level Grooming: Set up, Bed level Lower Body Bathing: Total assistance Lower Body Bathing Details (indicate cue type and reason): rolling r for peri hygiene Lower Body Dressing: Total assistance Toilet Transfer: +2 for physical assistance, Maximal assistance Toilet Transfer Details (indicate cue type and reason): simulate sit<>STand from EOB. General ADL Comments: pt completing mini squats at the eob to try to use flexion to help with tone with transfer. pt able to move L LE with therapist weight shifting patient however RLE just adducts to LLE due to tone. Pt requires multiple attempts at sit<>Stand to get pt foward flexion enough to keep some knee flexion and not just full knee extension     Mobility   Overal bed mobility: Needs Assistance Bed Mobility: Rolling, Sidelying to Sit Rolling: Mod assist Sidelying to sit: Mod assist, +2 for safety/equipment, HOB elevated Supine to sit: Mod assist, +2 for physical assistance Sit to supine: +2 for physical assistance, Mod assist General bed mobility comments: Cued pt to roll to side to facilitate hip and knee flexion, with increased ease compared to attempt in supine. ModA to roll and flex legs with blocking to maintain this position with transition to sit EOB.     Transfers   Overall transfer level: Needs assistance Equipment used: Rolling walker (2 wheeled) Transfer via Lift Equipment: Stedy Transfers: Sit to/from  Stand, Risk manager Sit to Stand: +2 physical assistance, Mod assist Stand pivot transfers: Max assist, +2 physical assistance General transfer comment: Sit to stand 1x from low level EOB and 2x from recliner, cuing pt to keep trunk flexed and push up from current sitting surface, modAx2 to power up and steady. MaxAx2 to stand step to L with RW, needing cues and assistance to weight shift and advance each leg. Pt tends to have more difficulty flexing the R leg with stepping. RW placed at low height to encourage tunk flexion and reduce leg extension hypertonicity.     Ambulation / Gait / Stairs / Wheelchair Mobility   Ambulation/Gait Ambulation/Gait assistance: Max assist, +2 physical assistance Gait Distance (Feet): 2 Feet Assistive device: Rolling walker (2 wheeled) Gait Pattern/deviations: Step-to pattern, Decreased step length - right, Decreased step length - left, Decreased stride length, Decreased dorsiflexion - right, Decreased dorsiflexion - left, Decreased weight shift to right, Decreased weight shift to left, Scissoring, Leaning posteriorly, Trunk flexed General Gait Details: RW placed at low height to encourage tunk flexion and thus decrease bil lower extremity hypertonicity. Pt able to flex L hip and knee slightly to clear foot to take step laterally or march in place, but pt maintains R knee in extension and needs extensive physical assistance to break the hypertonicity. Pt with bil ankle plantarflexion (L>R). Excessive hip adduction with marching in place, likely to compensate for lack of isolated hip flexion, resulting in narrow or scissoring steps. x1 bout stepping to L to chair and x1 bout marching in place anterior to recliner. Gait velocity: reduced Gait velocity interpretation: <1.31 ft/sec, indicative of household ambulator     Posture / Balance  Dynamic Sitting Balance Sitting balance - Comments: min guard to mod (A), preference for posterior leaning, Mod cuing for  leaning trunk anteriorly Balance Overall balance assessment: Needs assistance Sitting-balance support: Feet supported, Single extremity supported Sitting balance-Leahy Scale: Poor Sitting balance - Comments: min guard to mod (A), preference for posterior leaning, Mod cuing for leaning trunk anteriorly Standing balance support: Bilateral upper extremity supported, During functional activity Standing balance-Leahy Scale: Poor Standing balance comment: Requires UE support and external physical assistance.     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 2 cats ( glove and ginger)   Discharge Living Setting Plans for Discharge Living Setting: Patient's home, Lives with (comment) (spouse) Type of Home at Discharge: House Discharge Home Layout: Able to live on main level with bedroom/bathroom Discharge Home Access: Stairs to enter Entrance Stairs-Rails: Right, Left Entrance Stairs-Number of Steps: 3 Discharge Bathroom Shower/Tub: Tub/shower unit, Walk-in shower Discharge Bathroom Toilet: Standard Discharge Bathroom Accessibility: Yes How Accessible: Accessible via walker Does the patient have any problems obtaining your medications?: No   Social/Family/Support Systems Patient Roles: Spouse Contact Information: wife Anticipated Caregiver: wife Anticipated Caregiver's Contact Information: see above Ability/Limitations of Caregiver: no  limitations noted Caregiver Availability: 24/7 Discharge Plan Discussed with Primary Caregiver: Yes Is Caregiver In Agreement with Plan?: Yes Does Caregiver/Family have Issues with Lodging/Transportation while Pt is in Rehab?: No   Goals Patient/Family Goal for Rehab: supervision to min asisst with PT and OT Expected length of stay: ELOS 2 to 3 weeks Pt/Family Agrees to Admission and willing to participate: Yes Program Orientation Provided & Reviewed with Pt/Caregiver Including Roles  & Responsibilities: Yes   Decrease burden of Care through IP rehab admission: n/a   Possible need for SNF placement upon discharge: not anticipated   Patient Condition: I have reviewed medical records from [facility], spoken with CM, and patient. I discussed via phone for inpatient rehabilitation assessment.  Patient will benefit from ongoing PT and OT, can actively participate in 3 hours of therapy a day 5 days of the week, and can make measurable gains during the admission.  Patient will also benefit from the coordinated team approach during an Inpatient Acute Rehabilitation admission.  The patient will receive intensive therapy as well as Rehabilitation physician, nursing, social worker, and care management interventions.  Due to bladder management, bowel management, safety, skin/wound care, disease management, medication administration, pain management, and patient education the patient requires 24 hour a day rehabilitation nursing.  The patient is currently max assist overall with mobility and basic ADLs.  Discharge setting and therapy post discharge at home with outpatient is anticipated.  Patient has agreed to participate in the Acute Inpatient Rehabilitation Program and will admit today.   Preadmission Screen Completed By:  Cleatrice Burke, 06/26/2021 12:14 PM ______________________________________________________________________   Discussed status with Dr. Letta Pate on 06/26/2021 at 1214 and  received approval for admission today.   Admission Coordinator:  Cleatrice Burke, RN, time 6712 Date 06/26/2021    Assessment/Plan: Diagnosis:Bifrontal meningioma recurrence Does the need for close, 24 hr/day  Medical supervision in concert with the patient's rehab needs make it unreasonable for this patient to be served in a less intensive setting? Yes Co-Morbidities requiring supervision/potential complications: Paraparesis, spasticity Due to bladder management, bowel management, safety, skin/wound care, disease management, medication administration, pain management, and patient education, does the patient require 24 hr/day rehab nursing? Yes Does the patient require coordinated care of a physician, rehab nurse, PT, OT, and SLP to address physical and functional deficits in the context of the above medical diagnosis(es)? Yes Addressing deficits in the following areas: balance, endurance, locomotion, strength, transferring, bowel/bladder control, bathing, dressing, feeding, toileting, cognition, and psychosocial support Can the patient actively participate in an intensive therapy program of at least 3 hrs of therapy 5 days a week? Yes The potential for patient to make measurable gains while on inpatient rehab is good Anticipated functional outcomes upon discharge from inpatient rehab: min assist PT, min assist OT, modified independent SLP Estimated rehab length of stay to reach the above functional goals is: 2-3wks Anticipated discharge destination: Home 10. Overall Rehab/Functional Prognosis: good     MD Signature: Charlett Blake M.D. [employer] Fellow Am Acad of Phys Med and [place] Board of Electrodiagnostic Med Fellow Am Board of Interventional Pain           Revision History                                  Note Details  Author Charlett Blake, MD File Time 06/26/2021 12:23 PM  Author Type Physician Status Signed  Last Editor  Charlett Blake, MD Service Physical Medicine and [place] # [id number] Admit Date 06/26/2021

## 2021-06-26 NOTE — Progress Notes (Signed)
Pt transferred to 4W14 from 4NP04. Report called to [facility], RN. 2 PIVs removed and room belongings packed. Pt left with call bell and phone in reach with RN and NT at bedside.   Justice Rocher, RN

## 2021-06-26 NOTE — Progress Notes (Signed)
Inpatient Rehabilitation  Patient information reviewed and entered into eRehab system by Caylynn Minchew M. Kanani Mowbray, M.A., CCC/SLP, PPS Coordinator.  Information including medical coding, functional ability and quality indicators will be reviewed and updated through discharge.    

## 2021-06-26 NOTE — H&P (Signed)
Physical Medicine and Rehabilitation Admission H&P     CC: Functional deficits   HPI: James Olsen is a 75 year old RH male with history of CVA, macular degeneration, atypical meningioma RSXN and XRT (Dr. 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 He was admitted on 06/21/21 for resection of tumor with sacrifice of SSS with resultant RUE and BLE weakness post op. Baclofen added to manage spasticity and started on decadron taper. He is showing some improvement in BLE strength but continues to have significant extensor tone with standing attempts. CIR recommended due to functional decline.       ROS         Past Medical History:  Diagnosis Date   Arthritis     GERD (gastroesophageal reflux disease)     Glaucoma      pt denies.   History of blood transfusion       5 units after previous surgery   History of radiation therapy 12/09/18- 01/24/2019    Left sagittal brain, 11.8 Gy in 31 fractions for a total dose of 55.8 Gy   Macular degeneration     Stroke [facility])      after surgery in August 2019 mostly recovered still some weakness in left arm   Wears glasses             Past Surgical History:  Procedure Laterality Date   APPLICATION OF CRANIAL NAVIGATION N/A 06/21/2021    Procedure: APPLICATION OF CRANIAL NAVIGATION;  Surgeon: [name], MD;  Location: [place];  Service: Neurosurgery;  Laterality: N/A;   BRAIN SURGERY       CHOLECYSTECTOMY   2008    lap choli   COLONOSCOPY       CRANIOPLASTY N/A 10/29/2018    Procedure: CRANIOPLASTY;  Surgeon: [name], MD;  Location: [place];  Service: Neurosurgery;  Laterality: N/A;  CRANIOPLASTY   CRANIOTOMY N/A 08/18/2018    Procedure: CRANIOTOMY TUMOR EXCISION;  Surgeon: [name], MD;  Location: [place];  Service: Neurosurgery;  Laterality: N/A;  CRANIOTOMY TUMOR EXCISION   CRANIOTOMY Bilateral 06/21/2021    Procedure: Bilateral Craniotomy  for tumor resection;  Surgeon: [name], MD;  Location: [place];  Service: Neurosurgery;  Laterality: Bilateral;   DIAGNOSTIC LAPAROSCOPY       EYE SURGERY        laser surgery to relieve pressure   NAILBED REPAIR Left 05/04/2014    Procedure: LEFT INDEX NAIL ABLATION V-Y FLAP COVERAGE;  Surgeon: [name]., MD;  Location: [place];  Service: Orthopedics;  Laterality: Left;  index   PILONIDAL CYST EXCISION        x2   TONSILLECTOMY       VASECTOMY               Family History  Problem Relation Age of Onset   Diabetes Mother     Skin cancer Brother     Congenital heart disease Brother        Social History: Married. Independent PTA.  reports that he has never smoked. He has never used smokeless tobacco. He reports current alcohol use. He reports that he does not use drugs.     Allergies: No Known Allergies           Medications Prior to Admission  Medication Sig Dispense Refill   levETIRAcetam (KEPPRA) 500 MG tablet Take 1 tablet (  500 mg total) by mouth 2 (two) times daily. 60 tablet 3   Multiple Vitamins-Minerals (PRESERVISION AREDS 2) CAPS Take 1 capsule by mouth 2 (two) times daily.       Red Yeast Rice Extract 600 MG CAPS Take 1,200 mg by mouth daily.          Drug Regimen Review  Drug regimen was reviewed and remains appropriate with no significant issues identified   Home: Home Living Family/patient expects to be discharged to:: Private residence Living Arrangements: Spouse/significant other Available Help at Discharge: Family, Available 24 hours/day Type of Home: House Home Access: Stairs to enter [employer] of Steps: 3 Entrance Stairs-Rails: Right, Left Home Layout: Able to live on main level with bedroom/bathroom Bathroom Shower/Tub: Tub/shower unit, Multimedia programmer: Programmer, systems: Yes Home Equipment: Electronics engineer 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 pt pulling toward rail on L side. pt requires (A) To bring  bil LE off EOB but activating LLE. pt needs (A) with trunk elevation. pt static sitting with guarding to prevent extension and sliding off bed surface. pt requires bil LE onto bed surface to return supine. pt with knee flexion for stretching and buttock bridging with therapist blockign at ankles Transfers Overall transfer level: Needs assistance Equipment used: 2 person hand held assist Transfer via [place]: Stedy Transfers: Sit to/from Stand Sit to Stand: +2 physical assistance, Max assist Stand pivot transfers: Max assist, +2 physical assistance General transfer comment: pt completed sit<>stand 6 times during session - assist for power up, rise, steadying, breaking extensor tone. Lateral stepping x1 each direction   ADL: ADL Overall ADL's : Needs assistance/impaired Eating/Feeding: Set up, Bed level Grooming: Set up, Bed level Lower Body Bathing: Total assistance Lower Body Bathing Details (indicate cue type and reason): rolling r for peri hygiene Lower Body Dressing: Total assistance Toilet Transfer: +2 for physical assistance, Maximal assistance Toilet Transfer Details (indicate cue type and reason): simulate sit<>STand from EOB. General ADL Comments: pt completing mini squats at the eob to try to use flexion to help with tone with transfer. pt able to move L LE with therapist weight  shifting patient however RLE just adducts to LLE due to tone. Pt requires multiple attempts at sit<>Stand to get pt foward flexion enough to keep some knee flexion and not just full knee extension   Cognition: Cognition Overall Cognitive Status: Impaired/Different from baseline Orientation Level: Oriented X4 Cognition Arousal/Alertness: Awake/alert Behavior During Therapy: WFL for tasks assessed/performed Overall Cognitive Status: Impaired/Different from baseline Area of Impairment: Problem solving Problem Solving: Slow processing General Comments: Pt with delayed responses to PT/OT questions, at times requires multimodal cuing for follow commands     Blood pressure (!) 141/74, pulse 64, temperature 98.3 F (36.8 C), temperature source Oral, resp. rate 13, height 5\' 9"  (1.753 m), weight 86.4 kg, SpO2 99 %. Physical Exam  crani incision CDI General: No acute distress Mood and affect are appropriate Heart: Regular rate  and rhythm no rubs murmurs or extra sounds Lungs: Clear to auscultation, breathing unlabored, no rales or wheezes Abdomen: Positive bowel sounds, soft nontender to palpation, nondistended Extremities: No clubbing, cyanosis, or edema Skin: No evidence of breakdown, no evidence of rash Neurologic: Cranial nerves II through XII intact, motor strength is 5/5 in L and 4/5 Rdeltoid, bicep, tricep, grip,   Cannot assess seconday to elevated tomnehip flexor, knee extensors, ankle dorsiflexor and plantar flexor Sensory exam normal sensation to light touch and proprioception in bilateral upper and lower extremities Tone increase in BLE ext and adductors Musculoskeletal: Full range of motion in all 4 extremities. No joint swelling  Lab Results Last 48 Hours  No results found for this or any previous visit (from the past 48 hour(s)).   Imaging Results (Last 48 hours)  No results found.           Medical Problem List and Plan: 1.  Decline in ADL and Mobility  secondary to  Atypical meningioma             -patient may Not shower             -ELOS/Goals: 2-3 wks Min A 2.  Antithrombotics: -DVT/anticoagulation:  Pharmaceutical: Heparin             -antiplatelet therapy: 3. Pain Management: 4. Mood: LCSW to follow for evaluation and support.              -antipsychotic agents: N/A 5. Neuropsych: This patient is capable of making decisions on his own behalf. 6. Skin/Wound Care: Monitor wound for healing.  7. Fluids/Electrolytes/Nutrition: Monitor I/O. Check lytes in am. 8. Spasticity: Titrate baclofen 9. Thrombocytopenia: Monitor for signs of bleeding. Recheck  CBC in am. 10 Hyperglycemia: Likely due to steroids. --Will check A1C in am. 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 Acute blood loss anemia: Monitor for signs of bleeding with drop in plts.             --recheck CBC in am. 13. Chronic R-knee pain: S/P steroid injection 03/22 14. H/o Stroke with residual: 15. GERD?: Has been refusing PPI.         Bary Leriche, PA-C 06/26/2021  "I have personally performed a face to face diagnostic evaluation of this patient.  Additionally, I have reviewed and concur with the physician assistant's documentation above." Charlett Blake M.D. [facility] Fellow Am Acad of [employer] Med and Rehab Diplomate Am Board of Electrodiagnostic Med Fellow Am Board of Interventional Pain

## 2021-06-26 NOTE — Progress Notes (Signed)
Physical Therapy Treatment Patient Details Name: James Olsen MRN: [id number] DOB: [birth year] Today's Date: 06/26/2021    History of Present Illness Pt is a 75 y.o. M who presents s/p resection of recurrent parasagittal meningioma, requiring sacrifice of SSS. PMH: prior resection with craniotomy, meningioma.    PT Comments    Progressed pt to standing and taking several steps with a RW placed at a low height to encourage trunk flexion and thus reduce bil lower extremity extension hypertonicity. Pt with increased R lower extremity extension hypertonicity compared to L. Pt able to isolate hip and knee flexion on L but displays extreme difficulty doing so on the R, impacting his ability to clear his foot to step with his R. Facilitated bil lower extremity flexor muscles activation and isolation through exercises in sitting and standing. Will continue to follow acutely. Current recommendations remain appropriate.   Follow Up Recommendations  CIR     Equipment Recommendations  Wheelchair cushion (measurements PT);Wheelchair (measurements PT);3in1 (PT)    Recommendations for Other Services       Precautions / Restrictions Precautions Precautions: Fall;Other (comment) Precaution Comments: BLE extension hypertonicity Restrictions Weight Bearing Restrictions: No    Mobility  Bed Mobility Overal bed mobility: Needs Assistance Bed Mobility: Rolling;Sidelying to Sit Rolling: Mod assist Sidelying to sit: Mod assist;+2 for safety/equipment;HOB elevated       General bed mobility comments: Cued pt to roll to side to facilitate hip and knee flexion, with increased ease compared to attempt in supine. ModA to roll and flex legs with blocking to maintain this position with transition to sit EOB.    Transfers Overall transfer level: Needs assistance Equipment used: Rolling walker (2 wheeled) Transfers: Sit to/from [facility] Sit to Stand: +2 physical assistance;Mod  assist Stand pivot transfers: Max assist;+2 physical assistance       General transfer comment: Sit to stand 1x from low level EOB and 2x from recliner, cuing pt to keep trunk flexed and push up from current sitting surface, modAx2 to power up and steady. MaxAx2 to stand step to L with RW, needing cues and assistance to weight shift and advance each leg. Pt tends to have more difficulty flexing the R leg with stepping. RW placed at low height to encourage tunk flexion and reduce leg extension hypertonicity.  Ambulation/Gait Ambulation/Gait assistance: Max assist;+2 physical assistance Gait Distance (Feet): 2 Feet Assistive device: Rolling walker (2 wheeled) Gait Pattern/deviations: Step-to pattern;Decreased step length - right;Decreased step length - left;Decreased stride length;Decreased dorsiflexion - right;Decreased dorsiflexion - left;Decreased weight shift to right;Decreased weight shift to left;Scissoring;Leaning posteriorly;Trunk flexed Gait velocity: reduced Gait velocity interpretation: <1.31 ft/sec, indicative of household ambulator General Gait Details: RW placed at low height to encourage tunk flexion and thus decrease bil lower extremity hypertonicity. Pt able to flex L hip and knee slightly to clear foot to take step laterally or march in place, but pt maintains R knee in extension and needs extensive physical assistance to break the hypertonicity. Pt with bil ankle plantarflexion (L>R). Excessive hip adduction with marching in place, likely to compensate for lack of isolated hip flexion, resulting in narrow or scissoring steps. x1 bout stepping to L to chair and x1 bout marching in place anterior to recliner.   Stairs             Wheelchair Mobility    Modified Rankin (Stroke Patients Only) Modified Rankin (Stroke Patients Only) Pre-Morbid Rankin Score: No symptoms Modified Rankin: Severe disability     Balance  Overall balance assessment: Needs  assistance Sitting-balance support: Feet supported;Single extremity supported Sitting balance-Leahy Scale: Poor Sitting balance - Comments: min guard to mod (A), preference for posterior leaning, Mod cuing for leaning trunk anteriorly   Standing balance support: Bilateral upper extremity supported;During functional activity Standing balance-Leahy Scale: Poor Standing balance comment: Requires UE support and external physical assistance.                            Cognition Arousal/Alertness: Awake/alert Behavior During Therapy: WFL for tasks assessed/performed Overall Cognitive Status: Impaired/Different from baseline Area of Impairment: Problem solving                             Problem Solving: Slow processing General Comments: Pt with delayed responses to PT questions, at times requires multimodal cuing for follow commands. Increased time to process cues.      Exercises General Exercises - Lower Extremity Heel Slides: Both;5 reps;Seated (blocking of extension) Hip Flexion/Marching: Both;10 reps;Seated (blocking of extension) Mini-Sqauts: Both;5 reps;Standing (with RW, cuing and providing assistance to flex at hips and knees)    General Comments        Pertinent Vitals/Pain Pain Assessment: Faces Faces Pain Scale: Hurts a little bit Pain Location: BLE's Pain Descriptors / Indicators: Spasm Pain Intervention(s): Limited activity within patient's tolerance;Monitored during session;Repositioned    Home Living                      Prior Function            PT Goals (current goals can now be found in the care plan section) Acute Rehab PT Goals Patient Stated Goal: To get better and go to CIR PT Goal Formulation: With patient Time For Goal Achievement: 07/07/21 Potential to Achieve Goals: Good Progress towards PT goals: Progressing toward goals    Frequency    Min 4X/week      PT Plan Current plan remains appropriate     Co-evaluation              AM-PAC PT "6 Clicks" Mobility   Outcome Measure  Help needed turning from your back to your side while in a flat bed without using bedrails?: A Lot Help needed moving from lying on your back to sitting on the side of a flat bed without using bedrails?: A Lot Help needed moving to and from a bed to a chair (including a wheelchair)?: A Lot Help needed standing up from a chair using your arms (e.g., wheelchair or bedside chair)?: A Lot Help needed to walk in hospital room?: Total Help needed climbing 3-5 steps with a railing? : Total 6 Click Score: 10    End of Session Equipment Utilized During Treatment: Gait belt Activity Tolerance: Patient tolerated treatment well Patient left: with call bell/phone within reach;in chair;with chair alarm set   PT Visit Diagnosis: Other abnormalities of gait and mobility (R26.89);Difficulty in walking, not elsewhere classified (R26.2);Unsteadiness on feet (R26.81);Other symptoms and signs involving the nervous system (R29.898)     Time: 4580-9983 PT Time Calculation (min) (ACUTE ONLY): 29 min  Charges:  $Gait Training: 8-22 mins $Neuromuscular Re-education: 8-22 mins                     Moishe Spice, PT, DPT Acute Rehabilitation Services  Pager: [phone] Office: [place] 06/26/2021, 10:20 AM

## 2021-06-26 NOTE — Anesthesia Postprocedure Evaluation (Signed)
Anesthesia Post Note  Patient: James Olsen  Procedure(s) Performed: Bilateral Craniotomy for tumor resection (Bilateral) APPLICATION OF CRANIAL NAVIGATION     Patient location during evaluation: PACU Anesthesia Type: General Level of consciousness: patient cooperative and awake Pain management: pain level controlled Vital Signs Assessment: post-procedure vital signs reviewed and stable Respiratory status: spontaneous breathing, nonlabored ventilation, respiratory function stable and patient connected to nasal cannula oxygen Cardiovascular status: blood pressure returned to baseline and stable Postop Assessment: no apparent nausea or vomiting Anesthetic complications: no   No notable events documented.  Last Vitals:  Vitals:   06/26/21 0814 06/26/21 1123  BP: (!) 141/74 121/69  Pulse: 64 60  Resp: 13 16  Temp: 36.8 C 36.5 C  SpO2: 99% 97%    Last Pain:  Vitals:   06/26/21 1123  TempSrc: Oral  PainSc:                  Hosanna Betley

## 2021-06-27 DIAGNOSIS — Z86018 Personal history of other benign neoplasm: Secondary | ICD-10-CM

## 2021-06-27 DIAGNOSIS — R252 Cramp and spasm: Secondary | ICD-10-CM

## 2021-06-27 DIAGNOSIS — Z9889 Other specified postprocedural states: Secondary | ICD-10-CM

## 2021-06-27 DIAGNOSIS — K5901 Slow transit constipation: Secondary | ICD-10-CM

## 2021-06-27 LAB — CBC WITH DIFFERENTIAL/PLATELET
Abs Immature Granulocytes: 0.07 10*3/uL (ref 0.00–0.07)
Basophils Absolute: 0 10*3/uL (ref 0.0–0.1)
Basophils Relative: 0 %
Eosinophils Absolute: 0 10*3/uL (ref 0.0–0.5)
Eosinophils Relative: 0 %
HCT: 35.6 % — ABNORMAL LOW (ref 39.0–52.0)
Hemoglobin: 11.9 g/dL — ABNORMAL LOW (ref 13.0–17.0)
Immature Granulocytes: 1 %
Lymphocytes Relative: 9 %
Lymphs Abs: 0.8 10*3/uL (ref 0.7–4.0)
MCH: 29.9 pg (ref 26.0–34.0)
MCHC: 33.4 g/dL (ref 30.0–36.0)
MCV: 89.4 fL (ref 80.0–100.0)
Monocytes Absolute: 0.6 10*3/uL (ref 0.1–1.0)
Monocytes Relative: 7 %
Neutro Abs: 7.3 10*3/uL (ref 1.7–7.7)
Neutrophils Relative %: 83 %
Platelets: 178 10*3/uL (ref 150–400)
RBC: 3.98 MIL/uL — ABNORMAL LOW (ref 4.22–5.81)
RDW: 13 % (ref 11.5–15.5)
WBC: 8.8 10*3/uL (ref 4.0–10.5)
nRBC: 0 % (ref 0.0–0.2)

## 2021-06-27 LAB — COMPREHENSIVE METABOLIC PANEL
ALT: 46 U/L — ABNORMAL HIGH (ref 0–44)
AST: 21 U/L (ref 15–41)
Albumin: 2.9 g/dL — ABNORMAL LOW (ref 3.5–5.0)
Alkaline Phosphatase: 55 U/L (ref 38–126)
Anion gap: 7 (ref 5–15)
BUN: 22 mg/dL (ref 8–23)
CO2: 26 mmol/L (ref 22–32)
Calcium: 8.5 mg/dL — ABNORMAL LOW (ref 8.9–10.3)
Chloride: 106 mmol/L (ref 98–111)
Creatinine, Ser: 0.83 mg/dL (ref 0.61–1.24)
GFR, Estimated: >60 mL/min (ref >60)
Glucose, Bld: 136 mg/dL — ABNORMAL HIGH (ref 70–99)
Potassium: 4 mmol/L (ref 3.5–5.1)
Sodium: 139 mmol/L (ref 135–145)
Total Bilirubin: 0.8 mg/dL (ref 0.3–1.2)
Total Protein: 5.2 g/dL — ABNORMAL LOW (ref 6.5–8.1)

## 2021-06-27 LAB — HEMOGLOBIN A1C
Hgb A1c MFr Bld: 6 % — ABNORMAL HIGH (ref 4.8–5.6)
Mean Plasma Glucose: 125.5 mg/dL

## 2021-06-27 MED ORDER — SORBITOL 70 % SOLN
60.0000 mL | Status: AC
  Administered 2021-06-27: 60 mL via ORAL
  Filled 2021-06-27: qty 60

## 2021-06-27 MED ORDER — SENNOSIDES-DOCUSATE SODIUM 8.6-50 MG PO TABS
2.0000 | ORAL_TABLET | Freq: Every day | ORAL | Status: DC
  Administered 2021-06-27 – 2021-07-11 (×15): 2 via ORAL
  Filled 2021-06-27 (×17): qty 2

## 2021-06-27 NOTE — Progress Notes (Signed)
Physical Therapy Session Note  Patient Details  Name: James Olsen MRN: [id number] Date of Birth: 1946/05/23  Today's Date: 06/27/2021 PT Individual Time: 1622-1708 PT Individual Time Calculation (min): 46 min   Short Term Goals: Week 1:  PT Short Term Goal 1 (Week 1): Patient will perform bed mobility with min A. PT Short Term Goal 2 (Week 1): Patient will perform basic transfers with min A using LRAD. PT Short Term Goal 3 (Week 1): Patient will ambulate >100 ft using LRAD PT Short Term Goal 4 (Week 1): Patient will perform standing balance >5 min with CGA using LRAD.  Skilled Therapeutic Interventions/Progress Updates:  Pt received supine in bed and agreeable to therapy session. In supine, donned pants with max assist for threading over LEs - requires manual facilitation to bring R LE into hooklying position due to extensor tone, requires min assist for L LE due to weakness - with therapist facilitating maintaining LE positioning in hooklying, pt able to bridge and pull pants over hips with min assist. Supine>sitting R EOB, via logroll technique, to promote sustained B LE flexed positioning due to extensor tone - requires mod assist for B LE management and then min assist for sitting balance,using BUE support, due to posterior lean once upright. Requires manual facilitation of R LE into knee flexion to promote R LE WBing in sitting otherwise extensor tone causes foot to be extended out from underneath him. L squat pivot transfer EOB>w/c with mod assist for lifting/pivoting hips and therapist blocking R LE to ensure it stays in flexed positioning with WBing.  Transported to/from gym in w/c for time management and energy conservation. Sit>stands w/c or EOM>B UE support with mod assist for lifting to stand and balance. Gait training 8ft 2x with 3 [place] support and +2 mod assist for balance and B LE gait mechanics - demos extensor and adductor tone in R LE during swing phase that improved with  increased repetition and progression to continuous reciprocal stepping pattern, requires mod assist for advancement and repositioning - demos flexor tone in L LE requiring max assist to advance - requires facilitation for R/L weight shifting onto stance limb - pt able to turn and sit on mat with heavy +2 max assist to facilitate backwards stepping. Transported back to room. R squat pivot w/c>EOB with mod assist for lifting/pivoting hips - requires cuing for hand placement and facilitation to maintain R LE into flexed position to allow WBing due to extensor tone. Sit>supine via reverse logroll technique with mod assist for B LE management onto bed. Pt reports need to use bathroom - assist for LB clothing management and urinal placement. Pt left in care of NT.  Therapy Documentation Precautions:  Precautions Precautions: Fall, Other (comment) Precaution Comments: BLE extension hypertonicity Restrictions Weight Bearing Restrictions: No   Pain: Denies pain during session.   Therapy/Group: Individual Therapy  Tawana Scale , PT, DPT, NCS, CSRS 06/27/2021, 6:23 PM

## 2021-06-27 NOTE — Progress Notes (Signed)
PROGRESS NOTE   Subjective/Complaints: Had a pretty good night. Denies pain. Hasn't moved bowels since being in hospital but has had gas  ROS: Patient denies fever, rash, sore throat, blurred vision, nausea, vomiting, diarrhea, cough, shortness of breath or chest pain, joint or back pain, headache, or mood change.    Objective:   DG Abd 1 View  Result Date: 06/26/2021 CLINICAL DATA:  Abdominal pain. No bowel movement since surgery 5 days ago. EXAM: ABDOMEN - 1 VIEW COMPARISON:  None. FINDINGS: Gas and stool throughout the colon with some gas-filled small bowel. No small or large bowel distention. No radiopaque stones. Surgical clips in the right upper quadrant. Degenerative changes in the spine and hips. IMPRESSION: Nonobstructive bowel gas pattern with stool-filled colon and nondilated gas-filled small bowel. Changes likely constipation and/or ileus. Electronically Signed   By: Lucienne Capers M.D.   On: 06/26/2021 19:43   Recent Labs    06/27/21 0516  WBC 8.8  HGB 11.9*  HCT 35.6*  PLT 178   Recent Labs    06/27/21 0516  NA 139  K 4.0  CL 106  CO2 26  GLUCOSE 136*  BUN 22  CREATININE 0.83  CALCIUM 8.5*    Intake/Output Summary (Last 24 hours) at 06/27/2021 1138 Last data filed at 06/27/2021 0700 Gross per 24 hour  Intake 480 ml  Output 1000 ml  Net -520 ml        Physical Exam: Vital Signs Blood pressure (!) 148/78, pulse (!) 53, temperature 98.1 F (36.7 C), temperature source Oral, resp. rate 16, height 5\' 9"  (1.753 m), weight 88.1 kg, SpO2 97 %.  General: Alert and oriented x 3, No apparent distress HEENT: Head is normocephalic, atraumatic, PERRLA, EOMI, sclera anicteric, oral mucosa pink and moist, dentition intact, ext ear canals clear,  Neck: Supple without JVD or lymphadenopathy Heart: Reg rate and rhythm. No murmurs rubs or gallops Chest: CTA bilaterally without wheezes, rales, or rhonchi; no  distress Abdomen: Soft, non-tender, non-distended, bowel sounds positive. Extremities: No clubbing, cyanosis, or edema. Pulses are 2+ Psych: Pt's affect is appropriate. Pt is cooperative Skin: Clean and intact without signs of breakdown Neuro: Pt is cognitively appropriate with normal insight, memory, and awareness. Cranial nerves 2-12 are intact. Sensory exam is normal. Reflexes are 2+ UE and 3+ LE, extensor tone bilateral LE's, tight heel cords. UE motor 5.5 LE 1-2/5 prox to trace distally, left stronger than right Musculoskeletal: Full ROM, No pain with AROM or PROM in the neck, trunk, or extremities. Posture appropriate    Assessment/Plan: 1. Functional deficits which require 3+ hours per day of interdisciplinary therapy in a comprehensive inpatient rehab setting. Physiatrist is providing close team supervision and 24 hour management of active medical problems listed below. Physiatrist and rehab team continue to assess barriers to discharge/monitor patient progress toward functional and medical goals  Care Tool:  Bathing              Bathing assist       Upper Body Dressing/Undressing Upper body dressing   What is the patient wearing?: Hospital gown only    Upper body assist Assist Level: Minimal Assistance - Patient > 75%  Lower Body Dressing/Undressing Lower body dressing      What is the patient wearing?: Incontinence brief     Lower body assist Assist for lower body dressing: Total Assistance - Patient < 25%     Toileting Toileting    Toileting assist Assist for toileting: 2 Helpers     Transfers Chair/bed transfer  Transfers assist     Chair/bed transfer assist level: 2 Helpers     Locomotion Ambulation   Ambulation assist              Walk 10 feet activity   Assist           Walk 50 feet activity   Assist           Walk 150 feet activity   Assist           Walk 10 feet on uneven surface  activity   Assist            Wheelchair     Assist               Wheelchair 50 feet with 2 turns activity    Assist            Wheelchair 150 feet activity     Assist          Blood pressure (!) 148/78, pulse (!) 53, temperature 98.1 F (36.7 C), temperature source Oral, resp. rate 16, height 5\' 9"  (1.753 m), weight 88.1 kg, SpO2 97 %.  Medical Problem List and Plan: 1.  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  Antithrombotics: -DVT/anticoagulation:  Pharmaceutical: Heparin             -antiplatelet therapy: 3. Pain Management: 4. Mood: LCSW to follow for evaluation and support.              -antipsychotic agents: N/A 5. Neuropsych: This patient is capable of making decisions on his own behalf. 6. Skin/Wound Care: Monitor wound for healing.  7. Fluids/Electrolytes/Nutrition: encourage PO  -I personally reviewed the patient's labs today.   8. Spasticity: Titrate baclofen as needed, 10mg  tid currently 9. Thrombocytopenia: Monitor for signs of bleeding.  -up to 178k today 10 Hyperglycemia: Likely due to steroids. -- Hgb A1C 6.0. 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 Acute blood loss anemia: Monitor for signs of bleeding with drop in plts.             --hgb holding at 11.9. 13. Chronic R-knee pain: S/P steroid injection 03/22 14. H/o Stroke with residual: 15. GERD?: Has been refusing PPI.  16. Constipation:  -stool burden on kub, a couple liquid stools  -sorbitol now followed by SSE if needed    LOS: 1 days A FACE TO [place]  Meredith Staggers 06/27/2021, 11:38 AM

## 2021-06-27 NOTE — Progress Notes (Signed)
Soap suds enema administered per MD orders at 1830. Patient tolerated well. No complaints of pain or discomfort reported. Call bell in reach. Will continue to monitor.

## 2021-06-27 NOTE — Progress Notes (Signed)
Orthopedic Tech Progress Note Patient Details:  James Olsen [birth year] [id number] Large bilateral Prafo boots have been ordered from [employer]  Patient ID: [name], male   DOB: [birth year], 75 y.o.   MRN: [id number]  [name] 06/27/2021, 1:04 PM

## 2021-06-27 NOTE — Evaluation (Addendum)
Physical Therapy Assessment and Plan  Patient Details  Name: James Olsen MRN: [id number] Date of Birth: 1946/04/22  PT Diagnosis: Abnormal posture, Abnormality of gait, Ataxia, Coordination disorder, Difficulty walking, Hemiplegia dominant, and Hypertonia Rehab Potential: Good ELOS: 2-3 weeks   Today's Date: 06/27/2021 PT Individual Time: 0950-1115 PT Individual Time Calculation (min): 85 min    Hospital Problem: Principal Problem:   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[place]) Active Problems:   S/P resection of meningioma   Past Medical History:  Past Medical History:  Diagnosis Date   Arthritis    GERD (gastroesophageal reflux disease)    Glaucoma    pt denies.    History of blood transfusion     5 units after previous surgery   History of radiation therapy 12/09/18- 01/24/2019   Left sagittal brain, 11.8 Gy in 31 fractions for a total dose of 55.8 Gy   Macular degeneration    Stroke [facility])    after surgery in August 2019 mostly recovered still some weakness in left arm   Wears glasses    Past Surgical History:  Past Surgical History:  Procedure Laterality Date   APPLICATION OF CRANIAL NAVIGATION N/A 06/21/2021   Procedure: APPLICATION OF CRANIAL NAVIGATION;  Surgeon: [name], MD;  Location: [place];  Service: Neurosurgery;  Laterality: N/A;   BRAIN SURGERY     CHOLECYSTECTOMY  2008   lap choli   COLONOSCOPY     CRANIOPLASTY N/A 10/29/2018   Procedure: CRANIOPLASTY;  Surgeon: [name], MD;  Location: [place];  Service: Neurosurgery;  Laterality: N/A;  CRANIOPLASTY   CRANIOTOMY N/A 08/18/2018   Procedure: CRANIOTOMY TUMOR EXCISION;  Surgeon: [name], MD;  Location: [place];  Service: Neurosurgery;  Laterality: N/A;  CRANIOTOMY TUMOR EXCISION   CRANIOTOMY Bilateral 06/21/2021   Procedure: Bilateral Craniotomy for tumor resection;  Surgeon: [name], MD;  Location: [place];  Service: Neurosurgery;  Laterality: Bilateral;   DIAGNOSTIC LAPAROSCOPY     EYE SURGERY      laser surgery to relieve pressure   NAILBED REPAIR Left 05/04/2014   Procedure: LEFT INDEX NAIL ABLATION V-Y FLAP COVERAGE;  Surgeon: [name]., MD;  Location: [place];  Service: Orthopedics;  Laterality: Left;  index   PILONIDAL CYST EXCISION     x2   TONSILLECTOMY     VASECTOMY      Assessment & Plan Clinical Impression: Patient is a 75 y.o. year old RH male with history of CVA, macular degeneration, atypical meningioma RSXN and XRT (Dr. 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 He was admitted on 06/21/21 for resection of tumor with sacrifice of SSS with resultant RUE and BLE weakness post op. Baclofen added to manage spasticity and started on decadron taper. He is showing some improvement in BLE strength but continues to have significant extensor tone with standing attempts. CIR recommended due to functional decline.  Patient transferred to CIR on 06/26/2021 .   Patient currently requires mod with mobility secondary to muscle weakness and muscle joint tightness, decreased cardiorespiratoy endurance, impaired timing and sequencing, abnormal tone, unbalanced muscle activation, ataxia, decreased coordination, and decreased motor planning, decreased midline orientation and decreased attention to right, and decreased sitting balance, decreased standing balance, decreased postural control, hemiplegia, and decreased balance strategies.  Prior to hospitalization, patient was independent  with mobility and lived with Spouse in a House home.  Home access is  3Stairs to enter.  Patient will benefit from skilled PT intervention to maximize safe functional mobility, minimize fall risk, and decrease caregiver burden for planned discharge home with intermittent assist.  Anticipate patient will benefit from follow up OP at discharge.  PT - End of Session Activity Tolerance: Tolerates 30+  min activity with multiple rests Endurance Deficit: Yes PT Assessment Rehab Potential (ACUTE/IP ONLY): Good PT Barriers to Discharge: Home environment access/layout;Incontinence;Decreased caregiver support PT Patient demonstrates impairments in the following area(s): Balance;Behavior;Edema;Endurance;Motor;Nutrition;Pain;Perception;Safety;Sensory;Skin Integrity PT Transfers Functional Problem(s): Bed Mobility;Bed to Chair;Car;Furniture PT Locomotion Functional Problem(s): Ambulation;Wheelchair Mobility;Stairs PT Plan PT Intensity: Minimum of 1-2 x/day ,45 to 90 minutes PT Frequency: 5 out of 7 days PT Duration Estimated Length of Stay: 2-3 weeks PT Treatment/Interventions: Ambulation/gait training;Discharge planning;DME/adaptive equipment instruction;Functional mobility training;Pain management;Psychosocial support;Splinting/orthotics;Therapeutic Activities;UE/LE Strength taining/ROM;Wheelchair propulsion/positioning;UE/LE Coordination activities;Therapeutic Exercise;Stair training;Skin care/wound management;Patient/family education;Neuromuscular re-education;Functional electrical stimulation;Disease management/prevention;Community reintegration;Balance/vestibular training PT Transfers Anticipated Outcome(s): CGA with LRAD PT Locomotion Anticipated Outcome(s): CGA with LRAD >150 ft PT Recommendation Recommendations for Other Services: Neuropsych consult;Therapeutic Recreation consult Therapeutic Recreation Interventions: Stress management;Pet therapy Follow Up Recommendations: Outpatient PT Patient destination: Home Equipment Recommended: To be determined   PT Evaluation Precautions/Restrictions Precautions Precautions: Fall;Other (comment) Precaution Comments: BLE extension hypertonicity Restrictions Weight Bearing Restrictions: No  Home Living/Prior Functioning Home Living Available Help at Discharge: Family;Available 24 hours/day Type of Home: House Home Access: Stairs to  enter [employer] of Steps: 3 Entrance Stairs-Rails: Right;Left Home Layout: Able to live on main level with bedroom/bathroom Additional Comments: loves gardening landscaping and has 2 cats ( glove and ginger)  Lives With: Spouse Prior Function Level of Independence: Independent with basic ADLs;Independent with gait;Independent with transfers  Able to Take Stairs?: Yes Comments: Enjoys gardening, walks 5 miles a day, hiking, model railroading Vision/Perception  Perception Perception: Impaired Inattention/Neglect: Does not attend to right side of body Praxis Praxis: Impaired Praxis Impairment Details: Motor planning  Cognition Overall Cognitive Status: Within Functional Limits for tasks assessed Orientation Level: Oriented X4 Attention: Sustained Sustained Attention: Appears intact Memory: Appears intact Awareness: Appears intact Safety/Judgment: Appears intact Sensation Sensation Light Touch: Appears Intact Proprioception: Impaired by gross assessment Coordination Gross Motor Movements are Fluid and Coordinated: No Fine Motor Movements are Fluid and Coordinated: No Coordination and Movement Description: R hemi with B LE hypertonicity R>L in extension pattern, decreased coordination and attention to R upper extremity Heel Shin Test: unable due to increased LE tone Motor  Motor Motor: Hemiplegia;Abnormal postural alignment and control;Abnormal tone   Trunk/Postural Assessment  Cervical Assessment Cervical Assessment: Exceptions to [facility] (forward head) Thoracic Assessment Thoracic Assessment: Exceptions to [facility] (rounded shoulders) Lumbar Assessment Lumbar Assessment: Exceptions to [facility] (posterior pelvic tilt) Postural Control Postural Control: Deficits on evaluation (decreased/delayed, posterior bias)  Balance Standardized Balance Assessment Standardized Balance Assessment: Berg Balance Test Berg Balance Test Sit to Stand: Needs moderate or maximal assist to  stand Standing Unsupported: Unable to stand 30 seconds unassisted Sitting with Back Unsupported but Feet Supported on Floor or Stool: Able to sit safely and securely 2 minutes Stand to Sit: Needs assistance to sit Transfers: Needs one person to assist Standing Unsupported with Eyes Closed: Needs help to keep from falling Standing Ubsupported with Feet Together: Needs help to attain position and unable to hold for 15 seconds From Standing, Reach Forward with Outstretched Arm: Loses balance while trying/requires external support From Standing Position, Pick up Object from Floor: Unable to try/needs assist to keep balance From Standing Position, Turn to Look Behind Over  each Shoulder: Needs assist to keep from losing balance and falling Turn 360 Degrees: Needs assistance while turning Standing Unsupported, Alternately Place Feet on Step/Stool: Needs assistance to keep from falling or unable to try Standing Unsupported, One Foot in Front: Loses balance while stepping or standing Standing on One Leg: Unable to try or needs assist to prevent fall Total Score: 5 Static Sitting Balance Static Sitting - Balance Support: Bilateral upper extremity supported;Feet supported Static Sitting - Level of Assistance: 5: Stand by assistance Dynamic Sitting Balance Dynamic Sitting - Balance Support: Right upper extremity supported;Left upper extremity supported;Feet supported Dynamic Sitting - Level of Assistance: 4: Min assist Static Standing Balance Static Standing - Balance Support: Bilateral upper extremity supported;During functional activity Static Standing - Level of Assistance: 4: Min assist Dynamic Standing Balance Dynamic Standing - Balance Support: Bilateral upper extremity supported Dynamic Standing - Level of Assistance: 2: Max assist Extremity Assessment  RLE Assessment RLE Assessment: Exceptions to [facility] Passive Range of Motion (PROM) Comments: DF lacking grossly 10 deg in supine, able to  achieve neutral in sitting with manual over pressure General Strength Comments: Grossly at least 3/5, difficylt to formally assess due to extensor tone RLE Tone RLE Tone: Moderate;Hypertonic;Modified Ashworth Body Part - Modified Ashworth Scale: Gastrocnemius;Soleus;Quadriceps QUADRICEPS - Modified Ashworth Scale for Grading Hypertonia RLE: Considerable increase in muschle tone, passive movement difficult GASTROCNEMIUS - Modified Ashworth Scale for Grading Hypertonia RLE: Considerable increase in muschle tone, passive movement difficult SOLEUS - Modified Ashworth Scale for Grading Hypertonia RLE: Considerable increase in muschle tone, passive movement difficult RLE Tone Comments: hypertonicity in extension pattern, able to move out of synergy with increased time and effort LLE Assessment LLE Assessment: Exceptions to [facility] Passive Range of Motion (PROM) Comments: DF to neutral in supine and sitting with over pressure Active Range of Motion (AROM) Comments: can perform heel slide and seated hip flexion out of synergistic pattern without assist General Strength Comments: Grossly at least 3+/5 with functional mobility, difficult to formally assess due to hypertonicity LLE Tone LLE Tone: Mild;Hypertonic;Modified Ashworth Body Part - Modified Ashworth Scale: Quadriceps;Gastrocnemius;Soleus Quadriceps - Modified Ashworth Scale for Grading Hypertonia LLE: More marked increase in muscle tone through most of the ROM, but affected part(s) easily moved Gastrocnemius - Modified Ashworth Scale for Grading Hypertonia LLE: More marked increase in muscle tone through most of the ROM, but affected part(s) easily moved Soleus - Modified Ashworth Scale for Grading Hypertonia LLE: More marked increase in muscle tone through most of the ROM, but affected part(s) easily moved Modified Ashworth Scale for Grading Hypertonia LLE: More marked increase in muscle tone through most of the ROM, but affected part(s) easily  moved LLE Tone Comments: hypertonicity in extension pattern, able to move out of synergy volitionally  Care Tool Care Tool Bed Mobility Roll left and right activity   Roll left and right assist level: Minimal Assistance - Patient > 75%    Sit to lying activity   Sit to lying assist level: Moderate Assistance - Patient 50 - 74%    Lying to sitting edge of bed activity   Lying to sitting edge of bed assist level: Moderate Assistance - Patient 50 - 74%     Care Tool Transfers Sit to stand transfer   Sit to stand assist level: Moderate Assistance - Patient 50 - 74%    Chair/bed transfer   Chair/bed transfer assist level: Moderate Assistance - Patient 50 - 74%     Toilet transfer   Assist Level: Dependent -  Patient 0% (Stedy)    Scientist, product/process development transfer activity did not occur: Safety/medical concerns        Care Tool Locomotion Ambulation   Assist level: Maximal Assistance - Patient 25 - 49% Assistive device: Hand held assist Max distance: 2 ft  Walk 10 feet activity Walk 10 feet activity did not occur: Safety/medical concerns (limited by decreased balance and extensor hypertonicity)       Walk 50 feet with 2 turns activity Walk 50 feet with 2 turns activity did not occur: Safety/medical concerns      Walk 150 feet activity Walk 150 feet activity did not occur: Safety/medical concerns      Walk 10 feet on uneven surfaces activity Walk 10 feet on uneven surfaces activity did not occur: Safety/medical concerns      Stairs Stair activity did not occur: Safety/medical concerns        Walk up/down 1 step activity Walk up/down 1 step or curb (drop down) activity did not occur: Safety/medical concerns     Walk up/down 4 steps activity did not occuR: Safety/medical concerns  Walk up/down 4 steps activity      Walk up/down 12 steps activity Walk up/down 12 steps activity did not occur: Safety/medical concerns      Pick up small objects from floor Pick up small object  from the floor (from standing position) activity did not occur: Safety/medical concerns      Wheelchair   Type of Wheelchair: Manual   Wheelchair assist level: Supervision/Verbal cueing Max wheelchair distance: >200 ft  Wheel 50 feet with 2 turns activity   Assist Level: Supervision/Verbal cueing  Wheel 150 feet activity   Assist Level: Supervision/Verbal cueing    Refer to Care Plan for Long Term Goals  SHORT TERM GOAL WEEK 1 PT Short Term Goal 1 (Week 1): Patient will perform bed mobility with min A. PT Short Term Goal 2 (Week 1): Patient will perform basic transfers with min A using LRAD. PT Short Term Goal 3 (Week 1): Patient will ambulate >100 ft using LRAD PT Short Term Goal 4 (Week 1): Patient will perform standing balance >5 min with CGA using LRAD.  Recommendations for other services: Neuropsych and Therapeutic Recreation  Stress management  Skilled Therapeutic Intervention Evaluation completed (see details above and below) with education on PT POC and goals and individual treatment initiated with focus on functional mobility/transfers, LE strength, dynamic standing balance/coordination, ambulation, stair navigation, simulated car transfers, and improved endurance with activity Patient provided with 18"x18" wheelchair with standard cushion and adjustments made to promote optimal seating posture and pressure distribution. Patient also provided with RW for use in room and therapist adjusted to proper height for patient.  Patient in bed upon PT arrival. Patient alert and agreeable to PT session. Patient denied pain during session.  Therapeutic Activity: Bed Mobility: Patient performed rolling R/L with min A and supine to/from sit with mod A in a flat bed without use of bed rails to simulate home set-up. Patient sat EOB with CGA and mild posterior lean, quickly progressing to supervision with cues for bringing his trunk forward x3 over 2 min. Patient donned shorts with total A bed  level for time management/energy conservation and a shirt with min A for time management/energy conservation seated EOB. Transfers: Patient performed sit to/from stand x2 with mod A holding onto to therapist's arms in front of him with PT blocking patient's feet and facilitating forward weight shift. Provided verbal min cues for initiation, delayed latency  for initiating sitting. Patient performed squat pivot x1 with mod A with PT facilitating forward trunk flexion to reduce activation of extensor tone. He performed a toilet transfer using the Stedy with min A from w/c and mod A from low toilet, due to bladder incontinence during gait training. Patient was incontinent and continent of bladder and continent of bowl during toileting. Performed peri-care and lower body dressing, due to soiling from incontinence, with total A.   Gait Training:  Patient ambulated 2 feet using B HHA on therapist's arms with max A. Ambulated with increased B lower extensor tone with adduction when stepping, able to advance his L foot independently and required mod A to initiate stepping with his R, scissoring gait with R foot stepping on his L toes requiring mod A to initiate abduction to increase BOS. Patient became incontinent, without incontinence brief donned, during gait training and needed to stop to perform toileting in the room.   Wheelchair Mobility:  Patient propelled wheelchair >200 feet with supervision using B upper extremities. Tends to veer R and demonstrated reduced stroke length with R upper extremity and poor initiation with R upper extremity, required hand-over-hand assist to bring his R hand back to push x1.   Instructed pt in results of PT evaluation as detailed above, PT POC, rehab potential, rehab goals, and discharge recommendations. Additionally discussed CIR's policies regarding fall safety and use of chair alarm and/or quick release belt. Pt verbalized understanding and in agreement. Will update pt's  family members as they become available.   Patient in w/c in the room and handed off to [place], [place], at end of session.   Discharge Criteria: Patient will be discharged from PT if patient refuses treatment 3 consecutive times without medical reason, if treatment goals not met, if there is a change in medical status, if patient makes no progress towards goals or if patient is discharged from hospital.  The above assessment, treatment plan, treatment alternatives and goals were discussed and mutually agreed upon: by patient  Doreene Burke PT, DPT  06/27/2021, 5:01 PM

## 2021-06-27 NOTE — Evaluation (Addendum)
Occupational Therapy Assessment and Plan  Patient Details  Name: James Olsen MRN: [id number] Date of Birth: 01-25-1946  OT Diagnosis: abnormal posture, cognitive deficits, and hemiplegia affecting dominant side, hypertonia, impairments with coordination Rehab Potential: Rehab Potential (ACUTE ONLY): Good ELOS: 2-3 weeks   Today's Date: 06/27/2021 OT Individual Time: 1120-1220 OT Individual Time Calculation (min): 60 min     Hospital Problem: Principal Problem:   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[place]) Active Problems:   S/P resection of meningioma   Past Medical History:  Past Medical History:  Diagnosis Date   Arthritis    GERD (gastroesophageal reflux disease)    Glaucoma    pt denies.    History of blood transfusion     5 units after previous surgery   History of radiation therapy 12/09/18- 01/24/2019   Left sagittal brain, 11.8 Gy in 31 fractions for a total dose of 55.8 Gy   Macular degeneration    Stroke [facility])    after surgery in August 2019 mostly recovered still some weakness in left arm   Wears glasses    Past Surgical History:  Past Surgical History:  Procedure Laterality Date   APPLICATION OF CRANIAL NAVIGATION N/A 06/21/2021   Procedure: APPLICATION OF CRANIAL NAVIGATION;  Surgeon: James Pall, MD;  Location: [place];  Service: Neurosurgery;  Laterality: N/A;   BRAIN SURGERY     CHOLECYSTECTOMY  2008   lap choli   COLONOSCOPY     CRANIOPLASTY N/A 10/29/2018   Procedure: CRANIOPLASTY;  Surgeon: James Pall, MD;  Location: [place];  Service: Neurosurgery;  Laterality: N/A;  CRANIOPLASTY   CRANIOTOMY N/A 08/18/2018   Procedure: CRANIOTOMY TUMOR EXCISION;  Surgeon: James Pall, MD;  Location: [place];  Service: Neurosurgery;  Laterality: N/A;  CRANIOTOMY TUMOR EXCISION   CRANIOTOMY Bilateral 06/21/2021   Procedure: Bilateral Craniotomy for tumor resection;  Surgeon: James Pall, MD;  Location: [place];  Service: Neurosurgery;  Laterality: Bilateral;   DIAGNOSTIC  LAPAROSCOPY     EYE SURGERY     laser surgery to relieve pressure   NAILBED REPAIR Left 05/04/2014   Procedure: LEFT INDEX NAIL ABLATION V-Y FLAP COVERAGE;  Surgeon: James Olsen., MD;  Location: [place];  Service: Orthopedics;  Laterality: Left;  index   PILONIDAL CYST EXCISION     x2   TONSILLECTOMY     VASECTOMY      Assessment & Plan Clinical Impression:  James Olsen is a 75 year old RH male with history of CVA, macular degeneration, atypical meningioma RSXN and XRT (Dr. 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 He was admitted on 06/21/21 for resection of tumor with sacrifice of SSS with resultant RUE and BLE weakness post op. Baclofen added to manage spasticity and started on decadron taper. He is showing some improvement in BLE strength but continues to have significant extensor tone with standing attempts.  Patient transferred to CIR on 06/26/2021 .    Patient currently requires mod with basic self-care skills secondary to muscle weakness, decreased cardiorespiratoy endurance, abnormal tone, decreased coordination, and decreased motor planning,  , right side neglect, decreased attention and decreased problem solving, and decreased sitting balance, decreased standing balance, decreased postural control, hemiplegia, and decreased balance strategies.  Prior to hospitalization, patient could complete all ADLs and IADLs with independent .  Patient will benefit from skilled intervention to increase independence with basic self-care  skills prior to discharge home with care partner.  Anticipate patient will require 24 hour supervision and minimal physical assistance and follow up home health.  OT - End of Session Activity Tolerance: Tolerates 30+ min activity with multiple rests Endurance Deficit: Yes Endurance Deficit Description: benefits from seated rest breaks  throughout BADLs OT Assessment Rehab Potential (ACUTE ONLY): Good OT Patient demonstrates impairments in the following area(s): Balance;Perception;Cognition;Skin Integrity;Endurance;Motor OT Basic ADL's Functional Problem(s): Bathing;Dressing;Toileting OT Transfers Functional Problem(s): Toilet;Tub/Shower OT Plan OT Intensity: Minimum of 1-2 x/day, 45 to 90 minutes OT Frequency: 5 out of 7 days OT Duration/Estimated Length of Stay: 2-3 weeks OT Treatment/Interventions: Balance/vestibular training;Discharge planning;Self Care/advanced ADL retraining;Therapeutic Activities;UE/LE Coordination activities;Cognitive remediation/compensation;Disease mangement/prevention;Functional mobility training;Patient/family education;Skin care/wound managment;Therapeutic Exercise;Visual/perceptual remediation/compensation;DME/adaptive equipment instruction;Neuromuscular re-education;UE/LE Strength taining/ROM;Wheelchair propulsion/positioning OT Basic Self-Care Anticipated Outcome(s): CGA-supervision OT Toileting Anticipated Outcome(s): CGA OT Bathroom Transfers Anticipated Outcome(s): CGA OT Recommendation Patient destination: Home Follow Up Recommendations: 24 hour supervision/assistance;Home health OT Equipment Recommended: To be determined   OT Evaluation Precautions/Restrictions  Precautions Precautions: Fall;Other (comment) Precaution Comments: BLE extension hypertonicity Restrictions Weight Bearing Restrictions: No General Chart Reviewed: Yes Pain Pain Assessment Pain Scale: 0-10 Pain Score: 0-No pain Home Living/Prior Functioning Home Living Family/patient expects to be discharged to:: Private residence Living Arrangements: Spouse/significant other Available Help at Discharge: Family, Available 24 hours/day Type of Home: House Home Access: Stairs to enter [employer] of Steps: 3 Entrance Stairs-Rails: Right, Left Home Layout: Able to live on main level with  bedroom/bathroom Bathroom Shower/Tub: Tub/shower unit, Multimedia programmer: Standard Bathroom Accessibility: Yes Additional Comments: loves gardening landscaping and has 2 cats ( glove and ginger)  Lives With: Spouse IADL History Leisure and Hobbies: Avid gardener  Prior Function Level of Independence: Independent with basic ADLs, Independent with gait, Independent with transfers, Independent with homemaking with ambulation  Able to Take Stairs?: Yes Comments: Enjoys gardening, walks 5 miles a day, hiking, model railroading Vision Baseline Vision/History: Wears glasses Wears Glasses: At all times Patient Visual Report: No change from baseline Vision Assessment?: No apparent visual deficits Perception  Perception: Impaired Inattention/Neglect: Does not attend to right side of body Praxis Praxis: Impaired Praxis Impairment Details: Motor planning Cognition Overall Cognitive Status: Within Functional Limits for tasks assessed Arousal/Alertness: Awake/alert Orientation Level: Person;Place;Situation Person: Oriented Place: Oriented Situation: Oriented Year: 2022 Month: July Day of Week: Correct Memory: Appears intact Memory Recall Sock: Without Cue Memory Recall Blue: Without Cue Memory Recall Bed: Without Cue Attention: Sustained;Alternating Sustained Attention: Appears intact Awareness: Appears intact Problem Solving: Impaired Problem Solving Impairment: Functional basic Executive Function: Decision Making Decision Making: Impaired Decision Making Impairment: Functional basic;Verbal basic Safety/Judgment: Appears intact Sensation Sensation Light Touch: Appears Intact Hot/Cold: Appears Intact Proprioception: Impaired by gross assessment Stereognosis: Not tested Coordination Gross Motor Movements are Fluid and Coordinated: No Fine Motor Movements are Fluid and Coordinated: No Coordination and Movement Description: R hemi with B LE hypertonicity R>L in  extension pattern, decreased coordination and attention to R upper extremity Heel Shin Test: unable due to increased LE tone Motor  Motor Motor: Hemiplegia;Abnormal postural alignment and control;Abnormal tone  Trunk/Postural Assessment  Cervical Assessment Cervical Assessment: Exceptions to [facility] (forward head) Thoracic Assessment Thoracic Assessment: Exceptions to [facility] (rounded shoulders) Lumbar Assessment Lumbar Assessment: Exceptions to [facility] (posterior pelvic tilt) Postural Control Postural Control: Deficits on evaluation (posterior bias)  Balance Balance Balance Assessed: Yes Standardized Balance Assessment Standardized Balance Assessment: Berg Balance Test Berg Balance Test Sit to Stand: Needs moderate or maximal assist to stand  Standing Unsupported: Unable to stand 30 seconds unassisted Sitting with Back Unsupported but Feet Supported on Floor or Stool: Able to sit safely and securely 2 minutes Stand to Sit: Needs assistance to sit Transfers: Needs one person to assist Standing Unsupported with Eyes Closed: Needs help to keep from falling Standing Ubsupported with Feet Together: Needs help to attain position and unable to hold for 15 seconds From Standing, Reach Forward with Outstretched Arm: Loses balance while trying/requires external support From Standing Position, Pick up Object from Floor: Unable to try/needs assist to keep balance From Standing Position, Turn to Look Behind Over each Shoulder: Needs assist to keep from losing balance and falling Turn 360 Degrees: Needs assistance while turning Standing Unsupported, Alternately Place Feet on Step/Stool: Needs assistance to keep from falling or unable to try Standing Unsupported, One Foot in Front: Loses balance while stepping or standing Standing on One Leg: Unable to try or needs assist to prevent fall Total Score: 5 Static Sitting Balance Static Sitting - Balance Support: Bilateral upper extremity supported;Feet  supported Static Sitting - Level of Assistance: 5: Stand by assistance Dynamic Sitting Balance Dynamic Sitting - Balance Support: Right upper extremity supported;Left upper extremity supported;Feet supported Dynamic Sitting - Level of Assistance: 4: Min Insurance risk surveyor Standing - Balance Support: Bilateral upper extremity supported;During functional activity Static Standing - Level of Assistance: 4: Min assist Dynamic Standing Balance Dynamic Standing - Balance Support: Bilateral upper extremity supported Dynamic Standing - Level of Assistance: 2: Max assist Extremity/Trunk Assessment RUE Assessment RUE Assessment: Exceptions to [facility] General Strength Comments: 4-/5 LUE Assessment LUE Assessment: Within Functional Limits  Care Tool Care Tool Self Care Eating   Eating Assist Level: Set up assist    Oral Care    Oral Care Assist Level: Set up assist    Bathing         Assist Level: Maximal Assistance - Patient 24 - 49%    Upper Body Dressing(including orthotics)       Assist Level: Minimal Assistance - Patient > 75%    Lower Body Dressing (excluding footwear)     Assist for lower body dressing: Total Assistance - Patient < 25%    Putting on/Taking off footwear   What is the patient wearing?: Shoes;Non-skid slipper socks Assist for footwear: Dependent - Patient 0%       Care Tool Toileting Toileting activity   Assist for toileting: Total Assistance - Patient < 25%     Care Tool Bed Mobility Roll left and right activity   Roll left and right assist level: Minimal Assistance - Patient > 75%    Sit to lying activity   Sit to lying assist level: Moderate Assistance - Patient 50 - 74%    Lying to sitting edge of bed activity   Lying to sitting edge of bed assist level: Moderate Assistance - Patient 50 - 74%     Care Tool Transfers Sit to stand transfer   Sit to stand assist level: Moderate Assistance - Patient 50 - 74%    Chair/bed  transfer   Chair/bed transfer assist level: Moderate Assistance - Patient 50 - 74%     Toilet transfer   Assist Level: Dependent - Patient 0%     Care Tool Cognition Expression of Ideas and Wants Expression of Ideas and Wants: Some difficulty - exhibits some difficulty with expressing needs and ideas (e.g, some words or finishing thoughts) or speech is not clear   Understanding Verbal and Non-Verbal  Content Understanding Verbal and Non-Verbal Content: Usually understands - understands most conversations, but misses some part/intent of message. Requires cues at times to understand   Memory/Recall Ability *first 3 days only Memory/Recall Ability *first 3 days only: Current season;That he or she is in a hospital/hospital unit;Staff names and faces;Location of own room    Refer to Care Plan for Long Term Goals  SHORT TERM GOAL WEEK 1 OT Short Term Goal 1 (Week 1): Pt will dress LB using AE as needed with mod assist. OT Short Term Goal 2 (Week 1): Pt will complete toilet transfer with min assist. OT Short Term Goal 3 (Week 1): Pt will complete toileting tasks with mod assist.  Recommendations for other services: None    Skilled Therapeutic Intervention ADL ADL Eating: Set up Where Assessed-Eating: Wheelchair Grooming: Setup Where Assessed-Grooming: Sitting at sink Upper Body Dressing: Minimal assistance Where Assessed-Upper Body Dressing: Sitting at sink Lower Body Dressing: Maximal assistance Where Assessed-Lower Body Dressing: Other (Comment);Wheelchair (and standing at [employer]) Armed forces technical officer: Dependent (using stedy with mod assist for sit<>stand) Toilet Transfer Equipment: Bedside commode Mobility  Bed Mobility Bed Mobility: Rolling Right;Rolling Left;Supine to Sit;Sit to Supine Rolling Right: Minimal Assistance - Patient > 75% Rolling Left: Minimal Assistance - Patient > 75% Supine to Sit: Moderate Assistance - Patient 50-74% Sit to Supine: Moderate Assistance - Patient  50-74% Transfers Sit to Stand: Moderate Assistance - Patient 50-74% Stand to Sit: Moderate Assistance - Patient 50-74%   Discharge Criteria: Patient will be discharged from OT if patient refuses treatment 3 consecutive times without medical reason, if treatment goals not met, if there is a change in medical status, if patient makes no progress towards goals or if patient is discharged from hospital.  The above assessment, treatment plan, treatment alternatives and goals were discussed and mutually agreed upon: by patient and by family  Ezekiel Slocumb 06/27/2021, 6:00 PM

## 2021-06-27 NOTE — Progress Notes (Signed)
Inpatient Rehabilitation Center Individual Statement of Services  Patient Name:  James Olsen  Date:  06/27/2021  Welcome to the [facility].  Our goal is to provide you with an individualized program based on your diagnosis and situation, designed to meet your specific needs.  With this comprehensive rehabilitation program, you will be expected to participate in at least 3 hours of rehabilitation therapies Monday-Friday, with modified therapy programming on the weekends.  Your rehabilitation program will include the following services:  Physical Therapy (PT), Occupational Therapy (OT), Speech Therapy (ST), 24 hour per day rehabilitation nursing, Therapeutic Recreaction (TR), Neuropsychology, Care Coordinator, Rehabilitation Medicine, Nutrition Services, Pharmacy Services, and Other  Weekly team conferences will be held on Tuesdays to discuss your progress.  Your Inpatient Rehabilitation Care Coordinator will talk with you frequently to get your input and to update you on team discussions.  Team conferences with you and your family in attendance may also be held.  Expected length of stay: 2-3 Weeks  Overall anticipated outcome: Supervision to Min A  Depending on your progress and recovery, your program may change. Your Inpatient Rehabilitation Care Coordinator will coordinate services and will keep you informed of any changes. Your Inpatient Rehabilitation Care Coordinator's name and contact numbers are listed  below.  The following services may also be recommended but are not provided by the [facility]:   [facility] will be made to provide these services after discharge if needed.  Arrangements include referral to agencies that provide these services.  Your insurance has been verified to be:  Medicare A & B Your primary doctor is:  Kathryne Eriksson, MD  Pertinent information  will be shared with your doctor and your insurance company.  Inpatient Rehabilitation Care Coordinator:  Cathleen Corti [phone] or ([phone]  Information discussed with and copy given to patient by: Dyanne Iha, 06/27/2021, 11:26 AM

## 2021-06-27 NOTE — Progress Notes (Signed)
Inpatient Rehabilitation Care Coordinator Assessment and Plan Patient Details  Name: James Olsen MRN: [id number] Date of Birth: 06-Oct-1946  Today's Date: 06/27/2021  Hospital Problems: Principal Problem:   Atypical meningioma of brain [facility]) Active Problems:   S/P resection of meningioma  Past Medical History:  Past Medical History:  Diagnosis Date   Arthritis    GERD (gastroesophageal reflux disease)    Glaucoma    pt denies.    History of blood transfusion     5 units after previous surgery   History of radiation therapy 12/09/18- 01/24/2019   Left sagittal brain, 11.8 Gy in 31 fractions for a total dose of 55.8 Gy   Macular degeneration    Stroke [facility])    after surgery in August 2019 mostly recovered still some weakness in left arm   Wears glasses    Past Surgical History:  Past Surgical History:  Procedure Laterality Date   APPLICATION OF CRANIAL NAVIGATION N/A 06/21/2021   Procedure: APPLICATION OF CRANIAL NAVIGATION;  Surgeon: [name], MD;  Location: [place];  Service: Neurosurgery;  Laterality: N/A;   BRAIN SURGERY     CHOLECYSTECTOMY  2008   lap choli   COLONOSCOPY     CRANIOPLASTY N/A 10/29/2018   Procedure: CRANIOPLASTY;  Surgeon: [name], MD;  Location: [place];  Service: Neurosurgery;  Laterality: N/A;  CRANIOPLASTY   CRANIOTOMY N/A 08/18/2018   Procedure: CRANIOTOMY TUMOR EXCISION;  Surgeon: [name], MD;  Location: [place];  Service: Neurosurgery;  Laterality: N/A;  CRANIOTOMY TUMOR EXCISION   CRANIOTOMY Bilateral 06/21/2021   Procedure: Bilateral Craniotomy for tumor resection;  Surgeon: [name], MD;  Location: [place];  Service: Neurosurgery;  Laterality: Bilateral;   DIAGNOSTIC LAPAROSCOPY     EYE SURGERY     laser surgery to relieve pressure   NAILBED REPAIR Left 05/04/2014   Procedure: LEFT INDEX NAIL ABLATION V-Y FLAP COVERAGE;  Surgeon: [name]., MD;  Location: [place];  Service: Orthopedics;  Laterality:  Left;  index   PILONIDAL CYST EXCISION     x2   TONSILLECTOMY     VASECTOMY     Social History:  reports that he has never smoked. He has never used smokeless tobacco. He reports current alcohol use. He reports that he does not use drugs.  Family / Support Systems Marital Status: Married Patient Roles: Spouse Spouse/Significant Other: Donita Brooks Anticipated Caregiver: spouse Ability/Limitations of Caregiver: none Caregiver Availability: 24/7  Social History Preferred language: English Religion: Christian Read: Yes Write: Yes Legal History/Current Legal Issues: n/a Guardian/Conservator: spouse   Abuse/Neglect Abuse/Neglect Assessment Can Be Completed: Yes Physical Abuse: Denies Verbal Abuse: Denies Sexual Abuse: Denies Exploitation of patient/patient's resources: Denies Self-Neglect: Denies  Emotional Status Pt's affect, behavior and adjustment status: plesant Recent Psychosocial Issues: coping Psychiatric History: n/a Substance Abuse History: n/a  Patient / Family Perceptions, Expectations & Goals Pt/Family understanding of illness & functional limitations: yes spouse pleasant Premorbid pt/family roles/activities: Active and Independent Anticipated changes in roles/activities/participation: Spouse able to assist Pt/family expectations/goals: Supervision to [employer]: None Premorbid Home Care/DME Agencies: Other (Comment) (Grab bars and shower seat) Transportation available at discharge: Family able to transport Resource referrals recommended: Neuropsychology  Discharge Planning Living Arrangements: Spouse/significant other Support Systems: Spouse/significant other Type of Residence: Private residence (multi level home, able to maintain on main level) Insurance Resources: Chartered certified accountant Resources: [employer] Screen Referred: No Living Expenses: Own Money Management: Spouse, Patient Does the  patient have any  problems obtaining your medications?: No Home Management: Independent Patient/Family Preliminary Plans: Spouse able to assist Care Coordinator Anticipated Follow Up Needs: HH/OP Expected length of stay: 2-3 Weeks  Clinical Impression SW met with pt and spouse. Pt finished up therapy session. Introduced self explained role and addressed questions and concerns. SW will cont to follow up.   Dyanne Iha 06/27/2021, 12:40 PM

## 2021-06-28 DIAGNOSIS — R350 Frequency of micturition: Secondary | ICD-10-CM

## 2021-06-28 MED ORDER — DEXAMETHASONE 2 MG PO TABS
2.0000 mg | ORAL_TABLET | Freq: Three times a day (TID) | ORAL | Status: DC
  Administered 2021-06-28 – 2021-07-04 (×18): 2 mg via ORAL
  Filled 2021-06-28 (×18): qty 1

## 2021-06-28 MED ORDER — LIDOCAINE HCL URETHRAL/MUCOSAL 2 % EX GEL
1.0000 "application " | CUTANEOUS | Status: DC | PRN
  Administered 2021-06-28 – 2021-06-29 (×2): 1 via URETHRAL
  Filled 2021-06-28 (×2): qty 6

## 2021-06-28 NOTE — Progress Notes (Signed)
Physical Therapy Session Note  Patient Details  Name: James Olsen MRN: [id number] Date of Birth: July 20, 1946  Today's Date: 06/28/2021 PT Individual Time: 1110-1200 PT Individual Time Calculation (min): 50 min   Short Term Goals: Week 1:  PT Short Term Goal 1 (Week 1): Patient will perform bed mobility with min A. PT Short Term Goal 2 (Week 1): Patient will perform basic transfers with min A using LRAD. PT Short Term Goal 3 (Week 1): Patient will ambulate >100 ft using LRAD PT Short Term Goal 4 (Week 1): Patient will perform standing balance >5 min with CGA using LRAD. Week 2:    Week 3:     Skilled Therapeutic Interventions/Progress Updates:   Pain:  Pt reports no pain.  Treatment to tolerance.  Rest breaks and repositioning as needed.  Pt initially handed off in [place] during use of urinal, OT handed off to PT for completion of task as her session timed out.  After several min standing w/urinal w/cga to min assist, pt unable to further void.  Total assist for clothing management then stand to sit in wc from [place] w/min to mod assist to control descent.    Once in wc, pt transported to gym for continued session.   Seated in wc pt performed seated bilat hamstring stretching x 45sec x 2 each LE w/therapist assist. Therapist then performed prolonged stretching of the following: Hip adductors Quads Gastrocnemeius Soleus Hip internal rotators Hip external rotators Trunk extensors via heaby forward flexion  Pt then performed the following: Sit to stand from wc via 3 muskateers support w/overall mod assist for stability primarily. Gait 22ft w/+2 mod assist via 3 muskateers support, able to advance limbs 90% time without assist but requires assist to prevent scissoring on R>L w/majority of steps.  Initially max assist for wt shifting and initiation of stepping but able to progress w/approx 97ft segments w/some fluidity of gait before requiring brief "reset" of midline and continuation.   Wc follow for safety.  In room, Sit to stand as above, gait x 5 ft, turn as above.  When attempting to back to wc pt able to step w//LLE only, total assist for backing w/R due to inability to flex at knee/extensor tone. Once in recliner, pt requested assist to use urinal.  Sit to stand in stedy w/set up and min assist.  Handed off to NT as session timed out.     Therapy Documentation Precautions:  Precautions Precautions: Fall, Other (comment) Precaution Comments: BLE extension hypertonicity Restrictions Weight Bearing Restrictions: No    Therapy/Group: Individual Therapy Callie Fielding, [place] 06/28/2021, 12:25 PM

## 2021-06-28 NOTE — Progress Notes (Signed)
Occupational Therapy Session Note  Patient Details  Name: James Olsen MRN: [id number] Date of Birth: 11-04-1946  Today's Date: 06/28/2021 OT Individual Time: 1055-1110 OT Individual Time Calculation (min): 15 min   Session 2: OT Individual Time: 1300-1400 OT Individual Time Calculation (min): 60 min    Short Term Goals: Week 1:  OT Short Term Goal 1 (Week 1): Pt will dress LB using AE as needed with mod assist. OT Short Term Goal 2 (Week 1): Pt will complete toilet transfer with min assist. OT Short Term Goal 3 (Week 1): Pt will complete toileting tasks with mod assist.  Skilled Therapeutic Interventions/Progress Updates:    Pt received in his w/c with no c/o pain. Discussed PLOF from previous CVA/tumor resection and IADL engagement. Pt completed sit > stand with RW, cueing for hand placement, with mod A. He required mod stabilization in standing as well. He reported sudden urinary incontinence upon standing. Stedy obtained and pt assisted in standing (CGA in stedy) and urinal placed, which pt was then able to manage. Pt was directly passed off to PT in his room.   Session 2: Pt received sitting in the recliner with no c/o pain, reporting fatigue. Pt completed sit > stand from the recliner using the stedy with CGA. He was transferred to the [facility] over toilet via stedy. He required min A to manage clothing standing in the stedy. He was unable to void any further on [facility] and stood again with the stedy for transfer to the TTB in the shower. Careful management of water to ensure his head remained dry. Pt completed UB bathing with set up assist and required mod A for LB bathing- to reach his bottom thoroughly and for forward flexion to reach his feet. Pt completed transfer back out with stedy with min A to manage BLE extension hypertonicity. Pt also required assist to lift BLE to place onto stedy. He completed oral care standing/perched in the stedy with min-mod A for midline orientation as he  fatigued. From EOB pt had intermittent posterior LOB/bias with min A required to correct. He completed LB bathing with max A, d/t difficulty lifting feet as he reached distally. He completed sit > stand with mod A from EOB using the RW. Pt was returned to supine and left with all needs, bed alarm set.   Therapy Documentation Precautions:  Precautions Precautions: Fall, Other (comment) Precaution Comments: BLE extension hypertonicity Restrictions Weight Bearing Restrictions: No    Therapy/Group: Individual Therapy  [name] 06/28/2021, 6:42 AM

## 2021-06-28 NOTE — IPOC Note (Signed)
Overall Plan of Care [facility]) Patient Details Name: James Olsen MRN: [id number] DOB: [birth year]  Admitting Diagnosis: Atypical meningioma of brain [facility])  Hospital Problems: Principal Problem:   Atypical meningioma of brain [facility]) Active Problems:   S/P resection of meningioma     Functional Problem List: Nursing Bowel, Endurance, Medication Management, Safety, Perception, Motor, Skin Integrity, Sensory  PT Balance, Behavior, Edema, Endurance, Motor, Nutrition, Pain, Perception, Safety, Sensory, Skin Integrity  OT Balance, Perception, Cognition, Skin Integrity, Endurance, Motor  SLP    TR         Basic ADL's: OT Bathing, Dressing, Toileting     Advanced  ADL's: OT       Transfers: PT Bed Mobility, Bed to Chair, Car, Manufacturing systems engineer, Metallurgist: PT Ambulation, Emergency planning/management officer, Stairs     Additional Impairments: OT    SLP        TR      Anticipated Outcomes Item Anticipated Outcome  Self Feeding    Swallowing      Basic self-care  CGA-supervision  Toileting  CGA   Bathroom Transfers CGA  Bowel/Bladder  min assist with bowels  Transfers  CGA with LRAD  Locomotion  CGA with LRAD >150 ft  Communication     Cognition     Pain  <3  Safety/Judgment  min assist and no falls   Therapy Plan: PT Intensity: Minimum of 1-2 x/day ,45 to 90 minutes PT Frequency: 5 out of 7 days PT Duration Estimated Length of Stay: 2-3 weeks OT Intensity: Minimum of 1-2 x/day, 45 to 90 minutes OT Frequency: 5 out of 7 days OT Duration/Estimated Length of Stay: 2-3 weeks     Due to the current state of emergency, patients may not be receiving their 3-hours of Medicare-mandated therapy.   Team Interventions: Nursing Interventions Patient/Family Education, Bowel Management, Disease Management/Prevention, Pain Management, Medication Management, Skin Care/Wound Management, Discharge Planning, Psychosocial Support  PT interventions Ambulation/gait  training, Discharge planning, DME/adaptive equipment instruction, Functional mobility training, Pain management, Psychosocial support, Splinting/orthotics, Therapeutic Activities, UE/LE Strength taining/ROM, Wheelchair propulsion/positioning, UE/LE Coordination activities, Therapeutic Exercise, Stair training, Skin care/wound management, Patient/family education, Neuromuscular re-education, Functional electrical stimulation, Disease management/prevention, Academic librarian, Training and development officer  OT Interventions Training and development officer, Discharge planning, Self Care/advanced ADL retraining, Therapeutic Activities, UE/LE Coordination activities, Cognitive remediation/compensation, Disease mangement/prevention, Functional mobility training, Patient/family education, Skin care/wound managment, Therapeutic Exercise, Visual/perceptual remediation/compensation, DME/adaptive equipment instruction, Neuromuscular re-education, UE/LE Strength taining/ROM, Wheelchair propulsion/positioning  SLP Interventions    TR Interventions    SW/CM Interventions Discharge Planning, Psychosocial Support, Patient/Family Education, Disease Management/Prevention   Barriers to Discharge MD  Medical stability  Nursing Decreased caregiver support, Home environment access/layout, Incontinence, Wound Care, Lack of/limited family support, Weight, Medication compliance, Pending chemo/radiation, Behavior 2 level home, patient able to live on main level with bedroom/bathroom. 3 steps to enter with right and left hand rails. Lives with spouse who can provide care at discharge.  PT Home environment access/layout, Incontinence, Decreased caregiver support    OT      SLP      SW       Team Discharge Planning: Destination: PT-Home ,OT- Home , SLP-  Projected Follow-up: PT-Outpatient PT, OT-  24 hour supervision/assistance, Home health OT, SLP-  Projected Equipment Needs: PT-To be determined, OT- To be determined, SLP-   Equipment Details: PT- , OT-  Patient/family involved in discharge planning: PT- Patient,  OT-Patient, SLP-   MD ELOS: 2-3 weeks Medical Rehab  Prognosis:  Excellent Assessment: The patient has been admitted for CIR therapies with the diagnosis of mengiomas s/p resection. The team will be addressing functional mobility, strength, stamina, balance, safety, adaptive techniques and equipment, self-care, bowel and bladder mgt, patient and caregiver education, NMR, orthotics, pain mgt, community reentry. Goals have been set at contact guard assist with mobility and self-care.   Due to the current state of emergency, patients may not be receiving their 3 hours per day of Medicare-mandated therapy.    Meredith Staggers, MD, FAAPMR     See Team Conference Notes for weekly updates to the plan of care

## 2021-06-28 NOTE — Progress Notes (Signed)
Soap uds given on day shift effective. Patient had a large bowel movement. Did not consent to any further intervention regarding bowel. Dressing change to Scalp wound done. Volitional urination.Denies pain. Safety maintained.

## 2021-06-28 NOTE — Progress Notes (Signed)
Physical Therapy Session Note  Patient Details  Name: James Olsen MRN: [id number] Date of Birth: Mar 07, 1946  Today's Date: 06/28/2021 PT Individual Time: 0900-0959 PT Individual Time Calculation (min): 59 min   Short Term Goals: Week 1:  PT Short Term Goal 1 (Week 1): Patient will perform bed mobility with min A. PT Short Term Goal 2 (Week 1): Patient will perform basic transfers with min A using LRAD. PT Short Term Goal 3 (Week 1): Patient will ambulate >100 ft using LRAD PT Short Term Goal 4 (Week 1): Patient will perform standing balance >5 min with CGA using LRAD.  Skilled Therapeutic Interventions/Progress Updates: Pt presents supine in bed and agreeable to therapy.  Pt required manual assist to attain BLE knee flexion, R >>> left.  Pt then rolled to right side w/ min A and mod A sidelying to sit.  Pt then transferred sit to stand w/ Stedy to use urinal w/ min A to manage gown.  Pt urinated in urinal x < 50 ml.  Pt stood to don brief w/o knee buckling.  Pt sat to thread pants over LEs, total A for RLE.  Pt required assist to flex R knee and for placement, but no blocking of R knee w/ shoe on.  Pt then stood in [place] to pull pants up oiver hips w/ mod A.  Pt required Total A to don shoes in sitting and then sit to stand w/ mod A and to SPT bed > w/c to left.  Pt required facilitation for weight shift to advance LE.  Pyt performed SPT w/c <> mat table w/ mod A and weight shift.  Pt performed seated reaching and stacking cones crossing midline and placing to side on mat, lifting 1# weighted ball overhead and then w/ trunk rotation w/o LOB, but cueing for breathing technique w/ exertion.  Pt returned to room and remained in w/c w/ chair alarm on and all needs in reach.    Therapy Documentation Precautions:  Precautions Precautions: Fall, Other (comment) Precaution Comments: BLE extension hypertonicity Restrictions Weight Bearing Restrictions: No General:   Vital  Signs:  Pain:0/10 Pain Assessment Pain Scale: 0-10 Pain Score: 0-No pain Mobility:      Therapy/Group: Individual Therapy  Ladoris Gene 06/28/2021, 10:01 AM

## 2021-06-28 NOTE — Progress Notes (Addendum)
PROGRESS NOTE   Subjective/Complaints: Had 2 good bm's yesterday. Feels better today. Having difficulties sleeping d/t urinary frequency. No PVR's  ROS: Patient denies fever, rash, sore throat, blurred vision, nausea, vomiting, diarrhea, cough, shortness of breath or chest pain, joint or back pain, headache, or mood change.    Objective:   DG Abd 1 View  Result Date: 06/26/2021 CLINICAL DATA:  Abdominal pain. No bowel movement since surgery 5 days ago. EXAM: ABDOMEN - 1 VIEW COMPARISON:  None. FINDINGS: Gas and stool throughout the colon with some gas-filled small bowel. No small or large bowel distention. No radiopaque stones. Surgical clips in the right upper quadrant. Degenerative changes in the spine and hips. IMPRESSION: Nonobstructive bowel gas pattern with stool-filled colon and nondilated gas-filled small bowel. Changes likely constipation and/or ileus. Electronically Signed   By: Lucienne Capers M.D.   On: 06/26/2021 19:43   Recent Labs    06/27/21 0516  WBC 8.8  HGB 11.9*  HCT 35.6*  PLT 178   Recent Labs    06/27/21 0516  NA 139  K 4.0  CL 106  CO2 26  GLUCOSE 136*  BUN 22  CREATININE 0.83  CALCIUM 8.5*    Intake/Output Summary (Last 24 hours) at 06/28/2021 1257 Last data filed at 06/28/2021 0900 Gross per 24 hour  Intake 960 ml  Output 1225 ml  Net -265 ml        Physical Exam: Vital Signs Blood pressure 125/74, pulse (!) 59, temperature 98 F (36.7 C), temperature source Oral, resp. rate 17, height 5\' 9"  (1.753 m), weight 88.1 kg, SpO2 99 %.  Constitutional: No distress . Vital signs reviewed. HEENT: EOMI, oral membranes moist Neck: supple Cardiovascular: RRR without murmur. No JVD    Respiratory/Chest: CTA Bilaterally without wheezes or rales. Normal effort    GI/Abdomen: BS +, non-tender, non-distended Ext: no clubbing, cyanosis, or edema Psych: pleasant and cooperative  Skin: Scalp incision  CDI with staples, no drainage Neuro: Pt is cognitively appropriate with normal insight, memory, and awareness. Cranial nerves 2-12 are intact. Sensory exam is normal. Reflexes are 2+ UE and 3+ LE, extensor tone bilateral LE's, tight heel cords R>L. UE motor 5.5 LE 2-3/5 prox to trace distally, left 0-1/5 right.  Musculoskeletal: Full ROM, No pain with AROM or PROM in the neck, trunk, or extremities. Posture appropriate    Assessment/Plan: 1. Functional deficits which require 3+ hours per day of interdisciplinary therapy in a comprehensive inpatient rehab setting. Physiatrist is providing close team supervision and 24 hour management of active medical problems listed below. Physiatrist and rehab team continue to assess barriers to discharge/monitor patient progress toward functional and medical goals  Care Tool:  Bathing              Bathing assist Assist Level: Maximal Assistance - Patient 24 - 49%     Upper Body Dressing/Undressing Upper body dressing   What is the patient wearing?: Hospital gown only    Upper body assist Assist Level: Moderate Assistance - Patient 50 - 74%    Lower Body Dressing/Undressing Lower body dressing      What is the patient wearing?: Underwear/pull up  Lower body assist Assist for lower body dressing: Maximal Assistance - Patient 25 - 49%     Toileting Toileting    Toileting assist Assist for toileting: 2 Helpers     Transfers Chair/bed transfer  Transfers assist     Chair/bed transfer assist level: Moderate Assistance - Patient 50 - 74%     Locomotion Ambulation   Ambulation assist      Assist level: 2 helpers Assistive device: Other (comment) (3 Musketeer) Max distance: 61ft   Walk 10 feet activity   Assist  Walk 10 feet activity did not occur: Safety/medical concerns (limited by decreased balance and extensor hypertonicity)  Assist level: 2 helpers Assistive device: Other (comment)   Walk 50 feet  activity   Assist Walk 50 feet with 2 turns activity did not occur: Safety/medical concerns         Walk 150 feet activity   Assist Walk 150 feet activity did not occur: Safety/medical concerns         Walk 10 feet on uneven surface  activity   Assist Walk 10 feet on uneven surfaces activity did not occur: Safety/medical concerns         Wheelchair     Assist Will patient use wheelchair at discharge?: Yes Type of Wheelchair: Manual    Wheelchair assist level: Supervision/Verbal cueing Max wheelchair distance: 75    Wheelchair 50 feet with 2 turns activity    Assist        Assist Level: Supervision/Verbal cueing   Wheelchair 150 feet activity     Assist      Assist Level: Supervision/Verbal cueing   Blood pressure 125/74, pulse (!) 59, temperature 98 F (36.7 C), temperature source Oral, resp. rate 17, height 5\' 9"  (1.753 m), weight 88.1 kg, SpO2 99 %.  Medical Problem List and Plan: 1.  Decline in ADL and Mobility  secondary to Atypical meningioma with bilateral craniotomy/tumor resection 06/21/21  -weaning steroids             -patient may Not shower             -ELOS/Goals: 2-3 wks Min A  --Continue CIR therapies including PT, OT, and SLP    -PRAFo's for BLE 2.  Antithrombotics: -DVT/anticoagulation:  Pharmaceutical: Heparin             -antiplatelet therapy: 3. Pain Management: 4. Mood: LCSW to follow for evaluation and support.              -antipsychotic agents: N/A 5. Neuropsych: This patient is capable of making decisions on his own behalf. 6. Skin/Wound Care: Monitor wound for healing.  7. Fluids/Electrolytes/Nutrition: encourage PO  -labs have been reviewed  8. Spasticity: Titrate baclofen as needed, 10mg  tid currently  -rom, splinting as needed 9. Thrombocytopenia: Monitor for signs of bleeding.  -up to 178k  10 Hyperglycemia: Likely due to steroids. -- Hgb A1C 6.0. 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 Acute blood loss anemia: Monitor for signs of bleeding with drop in plts.             --hgb holding at 11.9. 13. Chronic R-knee pain: S/P steroid injection 03/22 14. H/o Stroke with residual: 15. GERD?: Has been refusing PPI.  16. Constipation:  -stool burden on kub, a couple liquid stools  -sorbitol   followed by SSE yesterday with good results 17. Urinary frequency:  -need to check PVR's before rxing     LOS: 2 days A FACE TO  [place] EVALUATION WAS PERFORMED  Meredith Staggers 06/28/2021, 12:57 PM

## 2021-06-29 LAB — URINALYSIS, ROUTINE W REFLEX MICROSCOPIC
Bacteria, UA: NONE SEEN
Bilirubin Urine: NEGATIVE
Glucose, UA: 50 mg/dL — AB
Ketones, ur: NEGATIVE mg/dL
Nitrite: NEGATIVE
Protein, ur: NEGATIVE mg/dL
RBC / HPF: >50 RBC/hpf — ABNORMAL HIGH (ref 0–5)
Specific Gravity, Urine: 1.027 (ref 1.005–1.030)
pH: 5 (ref 5.0–8.0)

## 2021-06-29 NOTE — Progress Notes (Addendum)
PROGRESS NOTE   Subjective/Complaints: Sleepy Tolerated PT well today No complaints Vitals stable  ROS: Patient denies fever, rash, sore throat, blurred vision, nausea, vomiting, diarrhea, cough, shortness of breath or chest pain, joint or back pain, headache, or mood change.    Objective:   No results found. Recent Labs    06/27/21 0516  WBC 8.8  HGB 11.9*  HCT 35.6*  PLT 178   Recent Labs    06/27/21 0516  NA 139  K 4.0  CL 106  CO2 26  GLUCOSE 136*  BUN 22  CREATININE 0.83  CALCIUM 8.5*    Intake/Output Summary (Last 24 hours) at 06/29/2021 1847 Last data filed at 06/29/2021 1747 Gross per 24 hour  Intake 560 ml  Output 1275 ml  Net -715 ml        Physical Exam: Vital Signs Blood pressure 112/74, pulse 77, temperature 98.4 F (36.9 C), temperature source Oral, resp. rate 18, height 5\' 9"  (1.753 m), weight 88.1 kg, SpO2 100 %. Gen: no distress, normal appearing HEENT: oral mucosa pink and moist, NCAT Cardio: Reg rate Chest: normal effort, normal rate of breathing Abd: soft, non-distended Ext: no edema Psych: pleasant, normal affect Skin: Scalp incision CDI with staples, no drainage Neuro: Pt is cognitively appropriate with normal insight, memory, and awareness. Cranial nerves 2-12 are intact. Sensory exam is normal. Reflexes are 2+ UE and 3+ LE, extensor tone bilateral LE's, tight heel cords R>L. UE motor 5.5 LE 2-3/5 prox to trace distally, left 0-1/5 right.  Musculoskeletal: Full ROM, No pain with AROM or PROM in the neck, trunk, or extremities. Posture appropriate    Assessment/Plan: 1. Functional deficits which require 3+ hours per day of interdisciplinary therapy in a comprehensive inpatient rehab setting. Physiatrist is providing close team supervision and 24 hour management of active medical problems listed below. Physiatrist and rehab team continue to assess barriers to discharge/monitor  patient progress toward functional and medical goals  Care Tool:  Bathing              Bathing assist Assist Level: Maximal Assistance - Patient 24 - 49%     Upper Body Dressing/Undressing Upper body dressing   What is the patient wearing?: Hospital gown only    Upper body assist Assist Level: Moderate Assistance - Patient 50 - 74%    Lower Body Dressing/Undressing Lower body dressing      What is the patient wearing?: Underwear/pull up     Lower body assist Assist for lower body dressing: Maximal Assistance - Patient 25 - 49%     Toileting Toileting    Toileting assist Assist for toileting: Moderate Assistance - Patient 50 - 74%     Transfers Chair/bed transfer  Transfers assist     Chair/bed transfer assist level: Moderate Assistance - Patient 50 - 74%     Locomotion Ambulation   Ambulation assist      Assist level: 2 helpers Assistive device: Other (comment) (3 Musketeer) Max distance: 26ft   Walk 10 feet activity   Assist  Walk 10 feet activity did not occur: Safety/medical concerns (limited by decreased balance and extensor hypertonicity)  Assist level: 2 helpers Assistive  device: Other (comment)   Walk 50 feet activity   Assist Walk 50 feet with 2 turns activity did not occur: Safety/medical concerns         Walk 150 feet activity   Assist Walk 150 feet activity did not occur: Safety/medical concerns         Walk 10 feet on uneven surface  activity   Assist Walk 10 feet on uneven surfaces activity did not occur: Safety/medical concerns         Wheelchair     Assist Will patient use wheelchair at discharge?: Yes Type of Wheelchair: Manual    Wheelchair assist level: Supervision/Verbal cueing Max wheelchair distance: 75    Wheelchair 50 feet with 2 turns activity    Assist        Assist Level: Supervision/Verbal cueing   Wheelchair 150 feet activity     Assist      Assist Level:  Supervision/Verbal cueing   Blood pressure 112/74, pulse 77, temperature 98.4 F (36.9 C), temperature source Oral, resp. rate 18, height 5\' 9"  (1.753 m), weight 88.1 kg, SpO2 100 %.  Medical Problem List and Plan: 1.  Decline in ADL and Mobility  secondary to Atypical meningioma with bilateral craniotomy/tumor resection 06/21/21  -weaning steroids, currently 2mg  q8H             -patient may Not shower             -ELOS/Goals: 2-3 wks Min A  --Continue CIR therapies including PT, OT, and SLP    -PRAFo's for BLE 2.  Impaired mobility -DVT/anticoagulation:  Pharmaceutical: Continue Heparin 5,000U q8H             -antiplatelet therapy: 3. Pain Management: N/A 4. Mood: LCSW to follow for evaluation and support.              -antipsychotic agents: N/A 5. Neuropsych: This patient is capable of making decisions on his own behalf. 6. Skin/Wound Care: Monitor wound for healing.  7. Fluids/Electrolytes/Nutrition: encourage PO  -labs have been reviewed  8. Spasticity: Titrate baclofen as needed, continue 10mg  tid currently  -rom, splinting as needed 9. Thrombocytopenia: Monitor for signs of bleeding.  -up to 178k  10 Hyperglycemia: Likely due to steroids. -- Hgb A1C 6.0. 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 Acute blood loss anemia: Monitor for signs of bleeding with drop in plts.             --hgb holding at 11.9. 13. Chronic R-knee pain: S/P steroid injection 03/22 14. H/o Stroke with residual: 15. GERD?: Has been refusing PPI.  16. Constipation:  -stool burden on kub, a couple liquid stools  -sorbitol   followed by SSE with good results  Continue Senna 2 tabs HS.  17. Urinary frequency:  -need to check PVR's before rxing  -UA/UC ordered     LOS: 3 days A FACE TO FACE EVALUATION WAS PERFORMED  Clide Deutscher Deana Krock 06/29/2021, 6:47 PM

## 2021-06-29 NOTE — Progress Notes (Signed)
Physical Therapy Session Note  Patient Details  Name: James Olsen MRN: [id number] Date of Birth: 1946/12/14  Today's Date: 06/29/2021 PT Individual Time: 1330-1430 PT Individual Time Calculation (min): 60 min   Short Term Goals: Week 1:  PT Short Term Goal 1 (Week 1): Patient will perform bed mobility with min A. PT Short Term Goal 2 (Week 1): Patient will perform basic transfers with min A using LRAD. PT Short Term Goal 3 (Week 1): Patient will ambulate >100 ft using LRAD PT Short Term Goal 4 (Week 1): Patient will perform standing balance >5 min with CGA using LRAD.  Skilled Therapeutic Interventions/Progress Updates:    Pain:  Pt reports no pain.  Treatment to tolerance.  Rest breaks and repositioning as needed.  Pt initially supine and agreeable to treatment session w/focus on stretching and funcitonal mobility.  Stretching in supine performed by therapist including: Gastrocs Soleus Hip IR and ERs Hamstrings Quads Lower trunk rotation Hip adducters  Pt supine to sit w/mod assist. Maintains sitting balance w/cga while therapist dons shoes for time management Squat pivot transfer bed to wc w/mod to max assist of 1. Pt transported to gym.   Pt educated re: use of LiteGait, harness, procedure, safety.  Pt agreeable.  Sit to stand in [place] w/mod assist.  Therapist dons harness and secures pt.  Pt w/post tendency at times in standing.    LiteGait Gait training:  4ft x 2 using for security but not bodywt support, single therapist operating Lt Gait and providing support therefore limited ability to assist w/wt shfiting during gait, max assist of 1 for clearance of RLE thru swing and to prevent scissoring. Cues for upright posture. Cues to widen base bilaterally during gait w/some control of abd on L.  Pt required extended rest break in sitting between gait trials due to dyspnea w/exertion.    Pt requesting assistance to commode for BM following gait.  Sit to stand in  [place] w/supervision.  Transported to commode perched sitting in [place].  Sit to stand and stand to sit from [place] w/bilat UE assist/support w/cga.   Nursing agreed to handoff of pt on commode.  Therapy Documentation Precautions:  Precautions Precautions: Fall, Other (comment) Precaution Comments: BLE extension hypertonicity Restrictions Weight Bearing Restrictions: No  Therapy/Group: Individual Therapy Callie Fielding, [place] 06/29/2021, 3:29 PM

## 2021-06-30 MED ORDER — TAMSULOSIN HCL 0.4 MG PO CAPS: 0.4000 mg | ORAL_CAPSULE | Freq: Every day | ORAL | Status: DC

## 2021-06-30 MED ORDER — CEPHALEXIN 250 MG PO CAPS
500.0000 mg | ORAL_CAPSULE | Freq: Two times a day (BID) | ORAL | Status: DC
  Administered 2021-06-30 – 2021-07-01 (×3): 500 mg via ORAL
  Filled 2021-06-30 (×3): qty 2

## 2021-06-30 MED ORDER — MAGNESIUM CITRATE PO SOLN
1.0000 | Freq: Once | ORAL | Status: AC
  Administered 2021-06-30: 1 via ORAL
  Filled 2021-06-30: qty 296

## 2021-06-30 NOTE — Progress Notes (Signed)
Occupational Therapy Session Note  Patient Details  Name: James Olsen MRN: [id number] Date of Birth: [birth year]  Today's Date: 06/30/2021 OT Individual Time: 0017-4944 OT Individual Time Calculation (min): 54 min   Today's Date: 06/30/2021 OT Individual Time: 1300-1358 OT Individual Time Calculation (min): 58 min    Short Term Goals: Week 1:  OT Short Term Goal 1 (Week 1): Pt will dress LB using AE as needed with mod assist. OT Short Term Goal 2 (Week 1): Pt will complete toilet transfer with min assist. OT Short Term Goal 3 (Week 1): Pt will complete toileting tasks with mod assist. Week 2:     Skilled Therapeutic Interventions/Progress Updates:     Pt received in bed finishing breakfast with mild pain in bladder. Attempt to toilet at seated level on BSC provided for pain relief  ADL:  Pt completes bathing with S at sit to stand level in stedy for power up and A to wash buttocks, back and B feet. Pt completes UB dressing with set up Pt completes LB dressing with MAX A for threading BLE and sit to stand at sink with MOD A.  Pt completes toileting with MAX A for CM components and increased time to attempt to void both bladder and bowel Pt completes toileting transfer with S at sit to stand level in stedy from EOB with no VC for technique, but min guarding provided at EOB for extensor tone  Pt left at end of session in bed with exit alarm on, call light in reach and all needs met  Session 2:  Pt received in bed with 0 out of 10 pain   Therapeutic activity Pt completes squat pivot transfers training with min-MOD A at beginning of session but requires MAX A to return to bed dt fatigue with manual facilitation of B feet and VC for hand placemnet.   Standing balance/tolerance task at [facility] with therapist seated in front of pt and standing next to patient with yoga blocks by hips to encourage power up/anterior weight shift. Min-mod A requried for balcne in stance while  manipulating push pins onto vertical board in min ranges outside BOS.   Standing at cabinets with MIN increaseing to MOD A to power up and maintian balance while loading/unloading dishwear onto counther tops for dynamic reach with VC for weight shift/midline orientation.  Pt left at end of session in bed with exit alarm on, call light in reach and all needs met   Therapy Documentation Precautions:  Precautions Precautions: Fall, Other (comment) Precaution Comments: BLE extension hypertonicity Restrictions Weight Bearing Restrictions: No General:   Vital Signs: Therapy Vitals Temp: 98.2 F (36.8 C) Temp Source: Oral Pulse Rate: (!) 56 Resp: 18 BP: 100/64 Patient Position (if appropriate): Lying Oxygen Therapy SpO2: 99 % O2 Device: Room Air Pain:   ADL: ADL Eating: Set up Where Assessed-Eating: Wheelchair Grooming: Setup Where Assessed-Grooming: Sitting at sink Upper Body Dressing: Minimal assistance Where Assessed-Upper Body Dressing: Sitting at sink Lower Body Dressing: Maximal assistance Where Assessed-Lower Body Dressing: Other (Comment), Wheelchair (and standing at [employer]) Toilet Transfer: Dependent (using stedy with mod assist for sit<>stand) Science writer: Bedside commode Vision   Perception    Praxis   Exercises:   Other Treatments:     Therapy/Group: Individual Therapy  Tonny Branch 06/30/2021, 6:45 AM

## 2021-06-30 NOTE — Progress Notes (Signed)
Physical Therapy Session Note  Patient Details  Name: James Olsen MRN: [id number] Date of Birth: April 06, 1946  Today's Date: 06/30/2021 PT Individual Time: 0910-1010 PT Individual Time Calculation (min): 60 min   Short Term Goals: Week 1:  PT Short Term Goal 1 (Week 1): Patient will perform bed mobility with min A. PT Short Term Goal 2 (Week 1): Patient will perform basic transfers with min A using LRAD. PT Short Term Goal 3 (Week 1): Patient will ambulate >100 ft using LRAD PT Short Term Goal 4 (Week 1): Patient will perform standing balance >5 min with CGA using LRAD.  Skilled Therapeutic Interventions/Progress Updates:     Patient in bed upon PT arrival. Patient alert and agreeable to PT session. Patient denied pain during session.  Focused session on neuromuscular reeducation for increased motor control and reduced assist with functional movements out of synergistic patterns.   Therapeutic Activity: Bed Mobility: Patient performed supine to/from sit with min A for B lower extremities. Provided verbal cues for bringing knees to chest to initiate mobility and prevent activation of extensor tone. Transfers: Patient performed sit to/from stand x2 with B HHA with PT in front and patient holding PT's shoulders with mod-min A. Provided mod multimodal cues for forward weight shift and initiation of knee/hip flexion for sitting.  Gait Training:  Patient ambulated 10 feet with a 180 degree turn using B hand on therapist's shoulders with mod progressing to max A due to fatigue at end of session. Ambulated with decreased R DF, increased R adduction, decreased R knee control in stance with mild extensor thrust, and decreased L weight shift. Provided min multimodal cues for L weight shift and lateral hip activation, initiation of stepping with R foot, and sequencing for turning to sit, patient with difficulty turning his R foot and required max A to complete turn and sit on the bed  safely.  Neuromuscular Re-ed: Patient performed the following activities: -standing mini- squats with mod A with B hand on therapist's shoulders x5 and CGA in // bars x8, focused on initiation and symmetry of movement of hip/knee flexion, provided a mirror for visual feedback in // bars -ambulating forwards and backwards x10 feet x3 in // bars with a mirror in front for visual feedback, required min A forwards with assist for R limb advancement x3 progressing to close supervision forwards without limb assist; required min A and max A for R limb advancement throughout backwards progressing to close supervision and mod-min A for R limb advancement throughout backwards -R step-tap to 7" step x10 progressing from max A to min A for R foot placement, focused on hip flexor, hamstring, and DF activation for foot clearance on/off the step   Patient in bed at end of session with breaks locked, bed alarm set, and all needs within reach.   Therapy Documentation Precautions:  Precautions Precautions: Fall, Other (comment) Precaution Comments: BLE extension hypertonicity Restrictions Weight Bearing Restrictions: No    Therapy/Group: Individual Therapy  Elzena Muston L Jeray Shugart PT, DPT  06/30/2021, 4:03 PM

## 2021-06-30 NOTE — Progress Notes (Signed)
PROGRESS NOTE   Subjective/Complaints: C/o constipation- agreeable to mag citrate today Crani incision healing well +urinary retention, elevated PVRs. UA positive.  Working well with therapy.   ROS: Patient denies fever, rash, sore throat, blurred vision, nausea, vomiting, diarrhea, cough, shortness of breath or chest pain, joint or back pain, headache, or mood change. +constipation   Objective:   No results found. No results for input(s): WBC, HGB, HCT, PLT in the last 72 hours.  No results for input(s): NA, K, CL, CO2, GLUCOSE, BUN, CREATININE, CALCIUM in the last 72 hours.   Intake/Output Summary (Last 24 hours) at 06/30/2021 1848 Last data filed at 06/30/2021 1825 Gross per 24 hour  Intake 680 ml  Output 2075 ml  Net -1395 ml        Physical Exam: Vital Signs Blood pressure 103/60, pulse 61, temperature 97.8 F (36.6 C), temperature source Oral, resp. rate 18, height 5\' 9"  (1.753 m), weight 88.1 kg, SpO2 99 %. Gen: no distress, normal appearing HEENT: oral mucosa pink and moist, NCAT Cardio: Reg rate Chest: normal effort, normal rate of breathing Abd: soft, non-distended Ext: no edema Psych: pleasant, normal affect Skin: Scalp incision CDI with staples, no drainage Neuro: Pt is cognitively appropriate with normal insight, memory, and awareness. Cranial nerves 2-12 are intact. Sensory exam is normal. Reflexes are 2+ UE and 3+ LE, extensor tone bilateral LE's, tight heel cords R>L. UE motor 5.5 LE 2-3/5 prox to trace distally, left 0-1/5 right.  Musculoskeletal: Full ROM, No pain with AROM or PROM in the neck, trunk, or extremities. Posture appropriate    Assessment/Plan: 1. Functional deficits which require 3+ hours per day of interdisciplinary therapy in a comprehensive inpatient rehab setting. Physiatrist is providing close team supervision and 24 hour management of active medical problems listed  below. Physiatrist and rehab team continue to assess barriers to discharge/monitor patient progress toward functional and medical goals  Care Tool:  Bathing    Body parts bathed by patient: Chest, Abdomen, Face (underams both)   Body parts bathed by helper: Buttocks (back)     Bathing assist Assist Level: Moderate Assistance - Patient 50 - 74%     Upper Body Dressing/Undressing Upper body dressing   What is the patient wearing?: Pull over shirt    Upper body assist Assist Level: Moderate Assistance - Patient 50 - 74%    Lower Body Dressing/Undressing Lower body dressing      What is the patient wearing?:  (shorts)     Lower body assist Assist for lower body dressing: Maximal Assistance - Patient 25 - 49%     Toileting Toileting    Toileting assist Assist for toileting: Moderate Assistance - Patient 50 - 74%     Transfers Chair/bed transfer  Transfers assist     Chair/bed transfer assist level: Minimal Assistance - Patient > 75%     Locomotion Ambulation   Ambulation assist      Assist level: Contact Guard/Touching assist Assistive device: Parallel bars Max distance: 10 ft   Walk 10 feet activity   Assist  Walk 10 feet activity did not occur: Safety/medical concerns (limited by decreased balance and extensor hypertonicity)  Assist  level: Moderate Assistance - Patient - 50 - 74% Assistive device: Hand held assist   Walk 50 feet activity   Assist Walk 50 feet with 2 turns activity did not occur: Safety/medical concerns         Walk 150 feet activity   Assist Walk 150 feet activity did not occur: Safety/medical concerns         Walk 10 feet on uneven surface  activity   Assist Walk 10 feet on uneven surfaces activity did not occur: Safety/medical concerns         Wheelchair     Assist Will patient use wheelchair at discharge?: Yes Type of Wheelchair: Manual    Wheelchair assist level: Supervision/Verbal cueing Max  wheelchair distance: 75    Wheelchair 50 feet with 2 turns activity    Assist        Assist Level: Supervision/Verbal cueing   Wheelchair 150 feet activity     Assist      Assist Level: Supervision/Verbal cueing   Blood pressure 103/60, pulse 61, temperature 97.8 F (36.6 C), temperature source Oral, resp. rate 18, height 5\' 9"  (1.753 m), weight 88.1 kg, SpO2 99 %.  Medical Problem List and Plan: 1.  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  Impaired mobility -DVT/anticoagulation:  Pharmaceutical: Continue Heparin 5,000U q8H             -antiplatelet therapy: 3. Pain Management: N/A 4. Mood: LCSW to follow for evaluation and support.              -antipsychotic agents: N/A 5. Neuropsych: This patient is capable of making decisions on his own behalf. 6. Skin/Wound Care: Monitor wound for healing.  7. Fluids/Electrolytes/Nutrition: encourage PO  -labs have been reviewed  8. Spasticity: Titrate baclofen as needed, continue 10mg  tid currently  -rom, splinting as needed 9. Thrombocytopenia: Monitor for signs of bleeding.  -up to 178k  10 Hyperglycemia: Likely due to steroids. -- Hgb A1C 6.0. 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 Acute blood loss anemia: Monitor for signs of bleeding with drop in plts.             --hgb holding at 11.9.  13. Chronic R-knee pain: S/P steroid injection 03/22 14. H/o Stroke with residual: 15. GERD?: Has been refusing PPI.  16. Constipation:  -stool burden on kub, a couple liquid stools  -sorbitol   followed by SSE with good results  Continue Senna 2 tabs HS.   Mag citrate on 7/10 17. Urinary frequency:  -need to check PVR's before rxing  -UA+: keflex  started, f/u UC     LOS: 4 days A FACE TO FACE EVALUATION WAS PERFORMED  James Olsen 06/30/2021, 6:48 PM

## 2021-07-01 ENCOUNTER — Inpatient Hospital Stay: Payer: Medicare Other | Attending: Internal Medicine

## 2021-07-01 LAB — CBC
HCT: 36.4 % — ABNORMAL LOW (ref 39.0–52.0)
Hemoglobin: 11.9 g/dL — ABNORMAL LOW (ref 13.0–17.0)
MCH: 29.9 pg (ref 26.0–34.0)
MCHC: 32.7 g/dL (ref 30.0–36.0)
MCV: 91.5 fL (ref 80.0–100.0)
Platelets: 201 10*3/uL (ref 150–400)
RBC: 3.98 MIL/uL — ABNORMAL LOW (ref 4.22–5.81)
RDW: 13.5 % (ref 11.5–15.5)
WBC: 8.7 10*3/uL (ref 4.0–10.5)
nRBC: 0 % (ref 0.0–0.2)

## 2021-07-01 LAB — BASIC METABOLIC PANEL
Anion gap: 7 (ref 5–15)
BUN: 15 mg/dL (ref 8–23)
CO2: 27 mmol/L (ref 22–32)
Calcium: 8.4 mg/dL — ABNORMAL LOW (ref 8.9–10.3)
Chloride: 99 mmol/L (ref 98–111)
Creatinine, Ser: 0.67 mg/dL (ref 0.61–1.24)
GFR, Estimated: >60 mL/min (ref >60)
Glucose, Bld: 172 mg/dL — ABNORMAL HIGH (ref 70–99)
Potassium: 4.1 mmol/L (ref 3.5–5.1)
Sodium: 133 mmol/L — ABNORMAL LOW (ref 135–145)

## 2021-07-01 LAB — URINE CULTURE: Culture: NO GROWTH

## 2021-07-01 MED ORDER — BACLOFEN 10 MG PO TABS
10.0000 mg | ORAL_TABLET | Freq: Four times a day (QID) | ORAL | Status: DC
  Administered 2021-07-01 – 2021-07-05 (×16): 10 mg via ORAL
  Filled 2021-07-01 (×16): qty 1

## 2021-07-01 MED ORDER — TAMSULOSIN HCL 0.4 MG PO CAPS
0.4000 mg | ORAL_CAPSULE | Freq: Every day | ORAL | Status: DC
  Administered 2021-07-01 – 2021-07-06 (×6): 0.4 mg via ORAL
  Filled 2021-07-01 (×6): qty 1

## 2021-07-01 MED ORDER — BETHANECHOL CHLORIDE 10 MG PO TABS
10.0000 mg | ORAL_TABLET | Freq: Three times a day (TID) | ORAL | Status: DC
  Administered 2021-07-01 – 2021-07-02 (×3): 10 mg via ORAL
  Filled 2021-07-01 (×3): qty 1

## 2021-07-01 NOTE — Progress Notes (Signed)
Occupational Therapy Session Note  Patient Details  Name: James Olsen MRN: [id number] Date of Birth: [birth year]  Today's Date: 07/01/2021 OT Individual Time: 6761-9509 OT Individual Time Calculation (min): 58 min    Short Term Goals: Week 1:  OT Short Term Goal 1 (Week 1): Pt will dress LB using AE as needed with mod assist. OT Short Term Goal 2 (Week 1): Pt will complete toilet transfer with min assist. OT Short Term Goal 3 (Week 1): Pt will complete toileting tasks with mod assist.  Skilled Therapeutic Interventions/Progress Updates:     Pt received in bed without pain.  ADL:  Pt completes bathing with A to wash B feet and back. Pt uses cutout in BSC in shower with VC for anteriore weigth shift to wash peri area and buttocks. Pt completes UB dressing with setup Pt completes LB dressing with MOD A to advance pants past hips at sit to stand level at sink with VC for hand placement during transitional movement Pt completes footwear with MAX A Overall. Review of use of sock aide. Overall pt requires MOD A to use sock aid to don B socks and total A to don shoes.  Pt completes shower/Tub transfer with stedy and CGA for sit to stand in stedy Pt grooms seated in w/c for shaving with electric razor and oral care with S  Pt left at end of session in bed with exit alarm on, call light in reach and all needs met   Therapy Documentation Precautions:  Precautions Precautions: Fall, Other (comment) Precaution Comments: BLE extension hypertonicity Restrictions Weight Bearing Restrictions: No General:   Vital Signs: Therapy Vitals Temp: 97.7 F (36.5 C) Temp Source: Oral Pulse Rate: (!) 55 Resp: 18 BP: 110/79 Patient Position (if appropriate): Lying Oxygen Therapy SpO2: 100 % O2 Device: Room Air Pain:   ADL: ADL Eating: Set up Where Assessed-Eating: Wheelchair Grooming: Setup Where Assessed-Grooming: Sitting at sink Upper Body Dressing: Minimal assistance Where  Assessed-Upper Body Dressing: Sitting at sink Lower Body Dressing: Maximal assistance Where Assessed-Lower Body Dressing: Other (Comment), Wheelchair (and standing at [employer]) Toilet Transfer: Dependent (using stedy with mod assist for sit<>stand) Science writer: Bedside commode Vision   Perception    Praxis   Exercises:   Other Treatments:     Therapy/Group: Individual Therapy  Tonny Branch 07/01/2021, 6:42 AM

## 2021-07-01 NOTE — Progress Notes (Signed)
PROGRESS NOTE   Subjective/Complaints: Happy to have moved his bowels. Retatining urine 300-800cc. Urine cx negative. Ongoing tone in LE's.   ROS: Patient denies fever, rash, sore throat, blurred vision, nausea, vomiting, diarrhea, cough, shortness of breath or chest pain, joint or back pain, headache, or mood change.     Objective:   No results found. Recent Labs    07/01/21 0737  WBC 8.7  HGB 11.9*  HCT 36.4*  PLT 201    Recent Labs    07/01/21 0737  NA 133*  K 4.1  CL 99  CO2 27  GLUCOSE 172*  BUN 15  CREATININE 0.67  CALCIUM 8.4*     Intake/Output Summary (Last 24 hours) at 07/01/2021 1012 Last data filed at 07/01/2021 0841 Gross per 24 hour  Intake 560 ml  Output 1750 ml  Net -1190 ml        Physical Exam: Vital Signs Blood pressure 103/65, pulse (!) 57, temperature 97.7 F (36.5 C), temperature source Oral, resp. rate 16, height 5\' 9"  (1.753 m), weight 88.1 kg, SpO2 100 %. Constitutional: No distress . Vital signs reviewed. HEENT: EOMI, oral membranes moist Neck: supple Cardiovascular: RRR without murmur. No JVD    Respiratory/Chest: CTA Bilaterally without wheezes or rales. Normal effort    GI/Abdomen: BS +, non-tender, non-distended Ext: no clubbing, cyanosis, or edema Psych: pleasant and cooperative  Skin: Scalp incision CDI with staples, no drainage Neuro: Pt is cognitively appropriate with normal insight, memory, and awareness. Cranial nerves 2-12 are intact. Sensory exam is normal. Reflexes are 2+ UE and 3+ LE, extensor tone bilateral LE's (MAS 2), tight heel cords R>L. UE motor 5.5 LE 2-3/5 prox to trace distally, left 0-1/5 right. --no significant motor changes Musculoskeletal: No pain with ROM   Assessment/Plan: 1. Functional deficits which require 3+ hours per day of interdisciplinary therapy in a comprehensive inpatient rehab setting. Physiatrist is providing close team supervision  and 24 hour management of active medical problems listed below. Physiatrist and rehab team continue to assess barriers to discharge/monitor patient progress toward functional and medical goals  Care Tool:  Bathing    Body parts bathed by patient: Chest, Abdomen, Face (underams both)   Body parts bathed by helper: Buttocks (back)     Bathing assist Assist Level: Moderate Assistance - Patient 50 - 74%     Upper Body Dressing/Undressing Upper body dressing   What is the patient wearing?: Pull over shirt    Upper body assist Assist Level: Moderate Assistance - Patient 50 - 74%    Lower Body Dressing/Undressing Lower body dressing      What is the patient wearing?:  (shorts)     Lower body assist Assist for lower body dressing: Maximal Assistance - Patient 25 - 49%     Toileting Toileting    Toileting assist Assist for toileting: Moderate Assistance - Patient 50 - 74%     Transfers Chair/bed transfer  Transfers assist     Chair/bed transfer assist level: Minimal Assistance - Patient > 75%     Locomotion Ambulation   Ambulation assist      Assist level: Contact Guard/Touching assist Assistive device: Parallel bars Max  distance: 10 ft   Walk 10 feet activity   Assist  Walk 10 feet activity did not occur: Safety/medical concerns (limited by decreased balance and extensor hypertonicity)  Assist level: Moderate Assistance - Patient - 50 - 74% Assistive device: Hand held assist   Walk 50 feet activity   Assist Walk 50 feet with 2 turns activity did not occur: Safety/medical concerns         Walk 150 feet activity   Assist Walk 150 feet activity did not occur: Safety/medical concerns         Walk 10 feet on uneven surface  activity   Assist Walk 10 feet on uneven surfaces activity did not occur: Safety/medical concerns         Wheelchair     Assist Will patient use wheelchair at discharge?: Yes Type of Wheelchair: Manual     Wheelchair assist level: Supervision/Verbal cueing Max wheelchair distance: 75    Wheelchair 50 feet with 2 turns activity    Assist        Assist Level: Supervision/Verbal cueing   Wheelchair 150 feet activity     Assist      Assist Level: Supervision/Verbal cueing   Blood pressure 103/65, pulse (!) 57, temperature 97.7 F (36.5 C), temperature source Oral, resp. rate 16, height 5\' 9"  (1.753 m), weight 88.1 kg, SpO2 100 %.  Medical Problem List and Plan: 1.  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  Impaired mobility -DVT/anticoagulation:  Pharmaceutical: Continue Heparin 5,000U q8H             -antiplatelet therapy: 3. Pain Management: N/A 4. Mood: LCSW to follow for evaluation and support.              -antipsychotic agents: N/A 5. Neuropsych: This patient is capable of making decisions on his own behalf. 6. Skin/Wound Care: Monitor wound for healing.  7. Fluids/Electrolytes/Nutrition: encourage PO  -labs have been reviewed 7/11 8. Spasticity: Titrate baclofen as needed, continue 10mg  tid currently  -rom, splinting.   7/11-increase baclofen to 10mg  qid 9. Thrombocytopenia: Monitor for signs of bleeding.  -up to 201k  10 Hyperglycemia: Likely due to steroids. -- Hgb A1C 6.0. 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 Acute blood loss anemia: Monitor for signs of bleeding with drop in plts.             --hgb holding at 11.9 again 7/11  13. Chronic R-knee pain: S/P steroid injection 03/22 14. H/o Stroke with residual: 15. GERD?: Has been refusing PPI.  16. Constipation:  -stool burden on kub, a couple liquid stools  -sorbitol   followed by SSE with good results  Continue Senna 2 tabs  HS.   Mag citrate on 7/10 with further results 17. Urinary frequency:  -pvr's 300-800cc  -UC negative--dc abx   -add urecholine and flomax to assist emptying     LOS: 5 days A FACE TO FACE EVALUATION WAS PERFORMED  Meredith Staggers 07/01/2021, 10:12 AM

## 2021-07-01 NOTE — Progress Notes (Signed)
Physical Therapy Session Note  Patient Details  Name: James Olsen MRN: [id number] Date of Birth: December 08, 1946  Today's Date: 07/01/2021 PT Individual Time: 1100-1200 PT Individual Time Calculation (min): 60 min   Short Term Goals: Week 1:  PT Short Term Goal 1 (Week 1): Patient will perform bed mobility with min A. PT Short Term Goal 2 (Week 1): Patient will perform basic transfers with min A using LRAD. PT Short Term Goal 3 (Week 1): Patient will ambulate >100 ft using LRAD PT Short Term Goal 4 (Week 1): Patient will perform standing balance >5 min with CGA using LRAD.  Skilled Therapeutic Interventions/Progress Updates:     Patient returning from the bathroom in the [place] with NT upon PT arrival. Patient alert and agreeable to PT session. Patient denied pain during session.  Therapeutic Activity: Bed Mobility: Patient performed sit to supine with mod A for lower extremity management due to fatigue at end of session. Provided verbal cues for lowering to side-lying and bringing knees to chest to prevent extensor tone. Transfers: Patient performed sit to/from stand holding Lite gait x1 and RW x2 with min A and increased time for boosting up. Provided verbal cues for hand placement, forward weight shift, and reaching back to sit.  Gait Training:  Patient ambulated 40 feet in the Lite gait with assist for forward progression of device and 140 feet + 80 feet using RW with mod A for stabilizing RW and forward progressing. Ambulated with decreased R DF and foot clearance with intermittent toe catching, decreased L weight shift, increased forward trunk flexion, mild extensor thrust in R stance, decreased lateral hip and gluteal activation, and narrow BOS with increased R adduction. Provided verbal cues for erect posture, proximity to RW, increased L weight shift with facilitation to improved R foot clearance and cues for leading with his heel to promote DF.  Neuromuscular Re-ed: Patient  performed the following lower extremity tone management activities: -B hamstring with DF stretch in supine 1x2 min -B knee flexion through full range x10 -B hip flexion 2x30 sec  Patient in bed at end of session with breaks locked, bed alarm set, and all needs within reach. Patient tearful following gait training, reports feeling emotional about his excellent progress today!  Therapy Documentation Precautions:  Precautions Precautions: Fall, Other (comment) Precaution Comments: BLE extension hypertonicity Restrictions Weight Bearing Restrictions: No    Therapy/Group: Individual Therapy  Montae Stager L Haidee Stogsdill PT, DPT  07/01/2021, 12:44 PM

## 2021-07-02 MED ORDER — BETHANECHOL CHLORIDE 25 MG PO TABS
25.0000 mg | ORAL_TABLET | Freq: Three times a day (TID) | ORAL | Status: DC
  Administered 2021-07-02 – 2021-07-04 (×6): 25 mg via ORAL
  Filled 2021-07-02 (×6): qty 1

## 2021-07-02 NOTE — Progress Notes (Signed)
Occupational Therapy Session Note  Patient Details  Name: James Olsen MRN: [id number] Date of Birth: 03-03-1946  Today's Date: 07/02/2021 OT Individual Time: 1350-1500 OT Individual Time Calculation (min): 70 min    Short Term Goals: Week 1:  OT Short Term Goal 1 (Week 1): Pt will dress LB using AE as needed with mod assist. OT Short Term Goal 2 (Week 1): Pt will complete toilet transfer with min assist. OT Short Term Goal 3 (Week 1): Pt will complete toileting tasks with mod assist.  Skilled Therapeutic Interventions/Progress Updates:    Pt received supine with no c/o pain. Pt agreeable to OT session. Pt reporting fatigue from previous sessions. Pt donned shoes with min A, occasional min A for sitting balance and cueing for problem solving. Pt completed squat pivot transfer to the w/c with mod A. In the therapy gym pt completed sit > stand with mod A, and then completed standing level functional reaching with BUE, use of RW for unilateral support. From the mat, pt worked on glute, hip, and core strengthening through quadruped and tall kneeling. Pt was guided through hip thrusts with focus on glute activation and breaking up extensor tone. Verbal and tactile cueing provided to increase proper muscle activation and technique. Modified plank holds to strength core stability, as well as B tricep stability for 30 sec x3 repetitions. Pt transitioned to supine on mat for a rest break. He then completed 2x10 hip bridges with isometric hold and tricep extensions with a 5lb dowel. Pt returned to the w/c with min A, lateral scoot. Mod A squat pivot back to bed. Pt was left supine with all needs met, bed alarm set.   Therapy Documentation Precautions:  Precautions Precautions: Fall, Other (comment) Precaution Comments: BLE extension hypertonicity Restrictions Weight Bearing Restrictions: No Therapy/Group: Individual Therapy  [name] 07/02/2021, 6:11 AM

## 2021-07-02 NOTE — Patient Care Conference (Signed)
Inpatient RehabilitationTeam Conference and Plan of Care Update Date: 07/02/2021   Time: 10:16 AM    Patient Name: James Olsen      Medical Record Number: [id number]  Date of Birth: 11/05/1946 Sex: Male         Room/Bed: 4W13C/4W13C-01 Payor Info: Payor: MEDICARE / Plan: MEDICARE PART A AND B / Product Type: *No Product type* /    Admit Date/Time:  06/26/2021  3:14 PM  Primary Diagnosis:  Atypical meningioma of brain [facility])  Hospital Problems: Principal Problem:   Atypical meningioma of brain [facility]) Active Problems:   S/P resection of meningioma    Expected Discharge Date: Expected Discharge Date: 07/18/21  Team Members Present: Physician leading conference: Dr. Alger Simons Care Coodinator Present: Loralee Pacas, LCSWA;Tyreonna Czaplicki Creig Hines, RN, BSN, CRRN Nurse Present: Dorthula Nettles, RN PT Present: Apolinar Junes, PT OT Present: Laverle Hobby, OT PPS Coordinator present : Gunnar Fusi, SLP     Current Status/Progress Goal Weekly Team Focus  Bowel/Bladder   patient is continent x2, urinary retention with bladder scan and straight cath q6  Pt will be able to urinate with out cath  assess qshft and prn   Swallow/Nutrition/ Hydration             ADL's   Max A LB ADLs, set up assist UB ADLs, mod A transfers, CGA stedy. Very hard worker  CGA overall  ADLs, transfers, safety, d/c planning   Mobility   Min A bed mobility and transfers, mod A gait 140 ft with RW  CGA-supervision overall  Lower extremity tone management/NMR, gait and stair training, functional mobility, activity tolerance, balance, patient/caregiver education   Communication             Safety/Cognition/ Behavioral Observations            Pain   denies pain. spasm managed by meds  parient will remain pain free  assess qs and prn   Skin   staples to the head intact and dry  incision site will remain free from infection  assess and provide care q shift and prn     Discharge Planning:  D/c to home  with 24/7 care from wife.   Team Discussion: Urine retention due to a neurogenic bladder. Incontinent bladder, continent bowel. Incision to crown of head has staples. Patient on target to meet rehab goals: yes, max assist lower body ADL's, mod assist upper body ADL's, using a steady. Contact guard goals. Tone is breaking up. Min assist to ambulate 130-140 ft. Did stair with min assist +2 for safety.   *See Care Plan and progress notes for long and short-term goals.   Revisions to Treatment Plan:  Bladder training medications to maximize emptying  Teaching Needs: Family education, medication management, skin/wound care, bladder management, transfer training, gait training, balance training, endurance training, stair training, safety awareness.  Current Barriers to Discharge: Decreased caregiver support, Medical stability, Home enviroment access/layout, Neurogenic bowel and bladder, Wound care, Lack of/limited family support, Weight, Medication compliance, and Behavior  Possible Resolutions to Barriers: Continue current medications, provide emotional support.     Medical Summary Current Status: bilateral meningiomas s/p craniotomy/resection. lower extremity tone/weakness, neurogenic bladder.  Barriers to Discharge: Medical stability   Possible Resolutions to [employer] Focus: bladder training/medications to maximize emptying. spasticity mgt   Continued Need for Acute Rehabilitation Level of Care: The patient requires daily medical management by a physician with specialized training in physical medicine and rehabilitation for the following reasons: Direction of a multidisciplinary  physical rehabilitation program to maximize functional independence : Yes Medical management of patient stability for increased activity during participation in an intensive rehabilitation regime.: Yes Analysis of laboratory values and/or radiology reports with any subsequent need for medication adjustment  and/or medical intervention. : Yes   I attest that I was present, lead the team conference, and concur with the assessment and plan of the team.   Cristi Loron 07/02/2021, 3:22 PM

## 2021-07-02 NOTE — Progress Notes (Signed)
Occupational Therapy Session Note  Patient Details  Name: James Olsen MRN: [id number] Date of Birth: 1946/10/19  Today's Date: 07/02/2021 OT Individual Time: 1129-1200 OT Individual Time Calculation (min): 31 min  and Today's Date: 07/02/2021 OT Missed Time: 14 Minutes Missed Time Reason: Nursing care;Other (comment) (bladder scan/ cath)   Short Term Goals: Week 1:  OT Short Term Goal 1 (Week 1): Pt will dress LB using AE as needed with mod assist. OT Short Term Goal 2 (Week 1): Pt will complete toilet transfer with min assist. OT Short Term Goal 3 (Week 1): Pt will complete toileting tasks with mod assist.  Skilled Therapeutic Interventions/Progress Updates:  Pt received supine in bed post cath, reporting fatigue but motivated to participate. Pt currently requires MIN A for bed mobility, MIN A for lateral scoot transfer from EOB<> w/c and MINA for standing with RW throughout session. Session focus on dynamic standing balance via bean bag toss activity. Additionally worked on heel strike accuracy with RLE by instructing pt to tap bean bag with R heel in standing, pt unable to tap bean bag with accuracy on RLE despite multiple attempts, attempted task from sitting with pt needing manual assist with BUEs to reposition RLE. Pt transported back to room with total A d/t faitgue.  Pt left supine in bed with all needs within reach and bed alarm activated.   Therapy Documentation Precautions:  Precautions Precautions: Fall, Other (comment) Precaution Comments: BLE extension hypertonicity Restrictions Weight Bearing Restrictions: No General: General OT Amount of Missed Time: 14 Minutes Vital Signs: Therapy Vitals Temp: 97.8 F (36.6 C) Temp Source: Oral Pulse Rate: 68 Resp: 17 BP: 109/70 Patient Position (if appropriate): Lying Oxygen Therapy SpO2: 100 % O2 Device: Room Air Pain: Pt reports pain in RLE during session, offered rest breaks and repositioning as pain mgmt strategy.     Therapy/Group: Individual Therapy  Precious Haws 07/02/2021, 3:39 PM

## 2021-07-02 NOTE — Progress Notes (Signed)
Occupational Therapy Session Note  Patient Details  Name: James Olsen MRN: [id number] Date of Birth: Jan 02, 1946  Today's Date: 07/02/2021 OT Individual Time: 0800-0830 OT Individual Time Calculation (min): 30 min    Short Term Goals: Week 1:  OT Short Term Goal 1 (Week 1): Pt will dress LB using AE as needed with mod assist. OT Short Term Goal 2 (Week 1): Pt will complete toilet transfer with min assist. OT Short Term Goal 3 (Week 1): Pt will complete toileting tasks with mod assist.  Skilled Therapeutic Interventions/Progress Updates:  Pt received supine in bed agreeable to OT intervention. Pt currently require MIN A for bed mobility via log roll technique needing assist to fully maneuver BLEs to EOB and light MIN A to elevate trunk into sitting. Pt completed lateral scoot transfer from EOB >w/c with MIN A. Pt required set- up for UB dressing and MAX A to don footwear, pt completed oral care at sink with supervision. Pt engaged in light BUE therex via w/c push ups x10 reps. Pt reports needing to void bowels at end of session, NT enter at end of session to transport pt to toilet via stedy, pt left with NT present.   Therapy Documentation Precautions:  Precautions Precautions: Fall, Other (comment) Precaution Comments: BLE extension hypertonicity Restrictions Weight Bearing Restrictions: No General:   Vital Signs:  Pain: Pt reports no pain during session.   Therapy/Group: Individual Therapy  Corinne Ports [facility] 07/02/2021, 11:12 AM

## 2021-07-02 NOTE — Progress Notes (Signed)
Patient ID: James Olsen, male   DOB: 1946-12-02, 75 y.o.   MRN: [id number]  SW met with pt in room to provide updates from team conference, and d/c date 7/28. Pt agreeable to SW following up with his wife. States SW should try home phone due to wife having   SW spoke with pt wife James Olsen [phone]) to provide updates. She asked if grab bars are needed for bathroom. Told her to take pictures and send to her husband or show therapist so they can provide suggestions.   Loralee Pacas, MSW, [place] Office: [phone] Cell: [phone] Fax: [phone]

## 2021-07-02 NOTE — Progress Notes (Addendum)
PROGRESS NOTE   Subjective/Complaints: Making gains in therapies. Feels that spasticity is better. Feeling urge to empty bladder but unable to get over threshold. Being cathed for 600-700ccs.   ROS: Patient denies fever, rash, sore throat, blurred vision, nausea, vomiting, diarrhea, cough, shortness of breath or chest pain, joint or back pain, headache, or mood change.     Objective:   No results found. Recent Labs    07/01/21 0737  WBC 8.7  HGB 11.9*  HCT 36.4*  PLT 201    Recent Labs    07/01/21 0737  NA 133*  K 4.1  CL 99  CO2 27  GLUCOSE 172*  BUN 15  CREATININE 0.67  CALCIUM 8.4*     Intake/Output Summary (Last 24 hours) at 07/02/2021 1126 Last data filed at 07/02/2021 0826 Gross per 24 hour  Intake 560 ml  Output 2700 ml  Net -2140 ml        Physical Exam: Vital Signs Blood pressure 109/64, pulse (!) 53, temperature 97.7 F (36.5 C), temperature source Oral, resp. rate 18, height 5\' 9"  (1.753 m), weight 88.1 kg, SpO2 99 %. Constitutional: No distress . Vital signs reviewed. HEENT: EOMI, oral membranes moist Neck: supple Cardiovascular: RRR without murmur. No JVD    Respiratory/Chest: CTA Bilaterally without wheezes or rales. Normal effort    GI/Abdomen: BS +, non-tender, non-distended Ext: no clubbing, cyanosis, or edema Psych: pleasant and cooperative Skin: Scalp incision CDI with staples--stable Neuro: Pt is cognitively appropriate with normal insight, memory, and awareness. Cranial nerves 2-12 are intact. Sensory exam is normal. Reflexes are 2+ UE and 3+ LE, extensor tone bilateral LE's (MAS 2), tight heel cords R>L. UE motor 5.5 LE 2-3/5 prox to trace distally, left 0-1/5 right. --no significant motor changes Musculoskeletal: No pain with ROM   Assessment/Plan: 1. Functional deficits which require 3+ hours per day of interdisciplinary therapy in a comprehensive inpatient rehab  setting. Physiatrist is providing close team supervision and 24 hour management of active medical problems listed below. Physiatrist and rehab team continue to assess barriers to discharge/monitor patient progress toward functional and medical goals  Care Tool:  Bathing    Body parts bathed by patient: Face   Body parts bathed by helper: Buttocks (back)     Bathing assist Assist Level: Supervision/Verbal cueing     Upper Body Dressing/Undressing Upper body dressing   What is the patient wearing?: Pull over shirt    Upper body assist Assist Level: Set up assist    Lower Body Dressing/Undressing Lower body dressing      What is the patient wearing?:  (shorts)     Lower body assist Assist for lower body dressing: Maximal Assistance - Patient 25 - 49%     Toileting Toileting    Toileting assist Assist for toileting: Moderate Assistance - Patient 50 - 74%     Transfers Chair/bed transfer  Transfers assist     Chair/bed transfer assist level: Minimal Assistance - Patient > 75% (lateral scoot transfer to w/c to pts L side)     Locomotion Ambulation   Ambulation assist      Assist level: Contact Guard/Touching assist Assistive device: Parallel bars  Max distance: 10 ft   Walk 10 feet activity   Assist  Walk 10 feet activity did not occur: Safety/medical concerns (limited by decreased balance and extensor hypertonicity)  Assist level: Moderate Assistance - Patient - 50 - 74% Assistive device: Hand held assist   Walk 50 feet activity   Assist Walk 50 feet with 2 turns activity did not occur: Safety/medical concerns         Walk 150 feet activity   Assist Walk 150 feet activity did not occur: Safety/medical concerns         Walk 10 feet on uneven surface  activity   Assist Walk 10 feet on uneven surfaces activity did not occur: Safety/medical concerns         Wheelchair     Assist Will patient use wheelchair at discharge?:  Yes Type of Wheelchair: Manual    Wheelchair assist level: Supervision/Verbal cueing Max wheelchair distance: 75    Wheelchair 50 feet with 2 turns activity    Assist        Assist Level: Supervision/Verbal cueing   Wheelchair 150 feet activity     Assist      Assist Level: Supervision/Verbal cueing   Blood pressure 109/64, pulse (!) 53, temperature 97.7 F (36.5 C), temperature source Oral, resp. rate 18, height 5\' 9"  (1.753 m), weight 88.1 kg, SpO2 99 %.  Medical Problem List and Plan: 1.  Decline in ADL and Mobility  secondary to Atypical meningioma with bilateral craniotomy/tumor resection 06/21/21  -weaning steroids, currently 2mg  q8H             -patient [place]             -ELOS/Goals: 2-3 wks Min A  -team conference today   -PRAFo's for BLE 2.  Impaired mobility -DVT/anticoagulation:  Pharmaceutical: Continue Heparin 5,000U q8H             -antiplatelet therapy: 3. Pain Management: N/A 4. Mood: LCSW to follow for evaluation and support.              -antipsychotic agents: N/A 5. Neuropsych: This patient is capable of making decisions on his own behalf. 6. Skin/Wound Care: Remove staples tomorrow or Thursday  7. Fluids/Electrolytes/Nutrition: encourage PO  -labs have been reviewed 7/11 8. Spasticity: Titrate baclofen as needed, continue 10mg  tid currently  -rom, splinting.   7/12: continue baclofen10mg  qid 9. Thrombocytopenia: Monitor for signs of bleeding.  -up to 201k  10 Hyperglycemia: Likely due to steroids. -- Hgb A1C 6.0. 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 Acute blood loss anemia: Monitor for signs of bleeding with drop in plts.             --hgb holding at 11.9 again 7/11  13. Chronic R-knee pain: S/P steroid injection 03/22 14. H/o Stroke with residual: 15. GERD?: Has been refusing PPI.  16. Constipation:  -stool burden on kub, a couple liquid stools  -sorbitol   followed by [facility] with good results  Continue  Senna 2 tabs HS.   Mag citrate on 7/10 with further results 17. Neurogenic bladder:  -pvr's 300-800cc  -UC negative--   -continue flomax and increase urecholine to assist emptying  -toilet or bsc to void     LOS: 6 days A FACE TO FACE EVALUATION WAS PERFORMED  Meredith Staggers 07/02/2021, 11:26 AM

## 2021-07-02 NOTE — Progress Notes (Signed)
Physical Therapy Session Note  Patient Details  Name: James Olsen MRN: [id number] Date of Birth: 1946/11/15  Today's Date: 07/02/2021 PT Individual Time: 0900-1000 PT Individual Time Calculation (min): 60 min   Short Term Goals: Week 1:  PT Short Term Goal 1 (Week 1): Patient will perform bed mobility with min A. PT Short Term Goal 2 (Week 1): Patient will perform basic transfers with min A using LRAD. PT Short Term Goal 3 (Week 1): Patient will ambulate >100 ft using LRAD PT Short Term Goal 4 (Week 1): Patient will perform standing balance >5 min with CGA using LRAD.  Skilled Therapeutic Interventions/Progress Updates:     Patient in w/c in the room upon PT arrival. Patient alert and agreeable to PT session. Patient denied pain during session.  Therapeutic Activity: Bed Mobility: Patient performed sit to supine with min A for B lower extremity management. Provided verbal cues for knees to chest to bring legs onto the bed. Transfers: Patient performed sit to/from stand x6 with min A for facilitation for forward weight shift and boosting up with gluteal activation.   Gait Training:  Patient ambulated 90 feet and 131 feet using RW with min A for facilitation for L weight shift and RW management. Ambulated with decreased R DF and hamstring activation, decreased L weight shift, and forward trunk flexion. Provided verbal cues for increased knee flexion and DF during swing phase for improved foot clearance. Patient ascended/descended 4-6" steps using B rails with min A of 1 person and CGA-SBA of a second for safety. Performed step-to gait pattern leading with L while ascending and R while descending. Provided cues for technique and sequencing and facilitation for R knee flexion and foot placement.   Neuromuscular Re-ed: Patient performed the following standing balance and lower extremity motor control activities: -alternating step taps to 6" step with B hand rails 2x10 with CGA for  balance and min A for R foot placement focused on increased hamstring and hip flexor activation to clear his R foot on and off the step -standing with B upper extremity support on rail with R hamstring curls 2x10 with min A and PNF tapping to hamstrings to facilitate hamstring activation  Educated on concepts of neuroplasticity, synergistic patterns following brain injury, and concepts of motor planning and muscle memory during rest breaks between activities.  Patient in bed due to increased fatigue at end of session with breaks locked, bed alarm set, and all needs within reach.   Therapy Documentation Precautions:  Precautions Precautions: Fall, Other (comment) Precaution Comments: BLE extension hypertonicity Restrictions Weight Bearing Restrictions: No   Therapy/Group: Individual Therapy  [place] Hara L Vernadette Stutsman PT, DPT  07/02/2021, 12:21 PM

## 2021-07-03 LAB — BPAM RBC
Blood Product Expiration Date: 202207252359
Blood Product Expiration Date: 202207252359
ISSUE DATE / TIME: 202207011105
ISSUE DATE / TIME: 202207011105
Unit Type and Rh: 6200
Unit Type and Rh: 6200

## 2021-07-03 LAB — TYPE AND SCREEN
ABO/RH(D): A POS
Antibody Screen: NEGATIVE
Unit division: 0
Unit division: 0

## 2021-07-03 NOTE — Progress Notes (Signed)
Occupational Therapy Session Note  Patient Details  Name: James Olsen MRN: [id number] Date of Birth: 1946/02/03  Today's Date: 07/03/2021 OT Individual Time: 9628-3662 OT Individual Time Calculation (min): 76 min    Short Term Goals: Week 1:  OT Short Term Goal 1 (Week 1): Pt will dress LB using AE as needed with mod assist. OT Short Term Goal 2 (Week 1): Pt will complete toilet transfer with min assist. OT Short Term Goal 3 (Week 1): Pt will complete toileting tasks with mod assist.  Skilled Therapeutic Interventions/Progress Updates:  Pt received supine in bed agreeable to OT intervention. Pt currently requires MIN A for bed mobility and CGA for sit<>stands throughout session with RW. Session focus on dynamic standing balance, BLE strengthing with an emphasis on increased hamstring and hip flexor activation to improve RLE coordination during functional mobility. Pt completed standing dynamic balance via BITS with pt able to tolerate standing 3 mins with pt completing task with 98.73% accuracy with a reaction time of 1.15 seconds. Remainder of session to focus on functional sit<>stands from [facility] with CGA and RW, continued worked on coordination with functional stepping with RLE. Pt able to stand and complete x10 reps of hip flexion using theraband fastened across RW as marker to tap R/L knee to band. Pt completed seated hip ABD/ ADD with small ball in between thighs with pt able to complete total of 20 reps seated at EOM. Pt completed ~ 20 of functional ambulation with Rw and CGA. Pt then reports increased pain requesting to return to room for pain meds. Pain meds administered during session while pt completed oral care from w/c level at sink. Pt reports desire to eventually be able to stand to use urinal, discussed working on standing tolerance and balance in further sessions. Pt completed lateral scoot transfer back to bed with MIN A d/t fatigue. Pt left supine in bed with all needs within  reach and bed alarm activated.   Therapy Documentation Precautions:  Precautions Precautions: Fall, Other (comment) Precaution Comments: BLE extension hypertonicity Restrictions Weight Bearing Restrictions: No General:   Vital Signs:  Pain: Pt reports 3/10 pain in R thigh and L knee, returned to room for pain meds, provided rest breaks during session with pt able to tolerate session.    Therapy/Group: Individual Therapy  Precious Haws 07/03/2021, 12:12 PM

## 2021-07-03 NOTE — Progress Notes (Signed)
Physical Therapy Session Note  Patient Details  Name: James Olsen MRN: [id number] Date of Birth: 11-Jul-1946  Today's Date: 07/03/2021 PT Individual Time: 0900-1000 PT Individual Time Calculation (min): 60 min   Short Term Goals: Week 1:  PT Short Term Goal 1 (Week 1): Patient will perform bed mobility with min A. PT Short Term Goal 2 (Week 1): Patient will perform basic transfers with min A using LRAD. PT Short Term Goal 3 (Week 1): Patient will ambulate >100 ft using LRAD PT Short Term Goal 4 (Week 1): Patient will perform standing balance >5 min with CGA using LRAD.  Skilled Therapeutic Interventions/Progress Updates:     Patient in bed upon PT arrival. Patient alert and agreeable to PT session. Patient denied pain during session, reports poor sleep last night due to requiring in/out catheterization x2.   Applied Kinesiotape over gastroc/soleus using inhibitory technique with the aim of reducing PF activation and over anterior tib and peroneus longus using facilitory technique with aim of facilitating DF activation. Prior to application, patient denied any history of skin irritation or allergy to adhesive. Educated patient on purpose of kinesiotape placement and signs symptoms of allergic reaction or irritation. Cleaned patient's skin and applied test strip at beginning of session and removed at end of session without sings of skin irritation. Instructed patient that the tape can be left on up to 3 days and can be worn in the shower. Instructed to removed the tape if peeling off, signs of skin irritation arise, or it has been on for >3 days. Informed patient that the tape is best removed in the shower or with a wet wash cloth. Patient stated understanding. Rehab team informed about tape placement to assist with monitoring patient's response.  Therapeutic Activity: Bed Mobility: Patient performed rolling supine<>prone and supine to/from sit with CGA-supervision and increased time due to  extensor tone. Provided verbal cues for bending his knees to reduce lower extremity tone with mobility. Transfers: Patient performed sit to/from stand x2 with min A for forward weight shift and reduced control during descent due to posterior bias when turning to sit with narrow BOS and increased B knee extension. Provided verbal cues for increased BOS, increased knee flexion in stance during turns and forward weight shift to reduce posterior bias.  Gait Training:  Patient ambulated 108 feet x2 using RW with CGA-min A. Ambulated with decreased R foot clearance due to decreased DF and hamstring activation in swing, decreased L weight shift, lateral hip instability R>L, and increased hip flexion. Provided mod multimodal cues for increased BOS, R hamstring activation, and leading with his heel at initial contact.  Neuromuscular Re-ed: Patient performed the following lower extremity motor control activities: -mini squats x5 with B upper extremity support on RW, focused on symmetrical movement and initiation of knee bend to break up extensor patterning -quadruped rocking then progressing to reciprocal crawling forward and backward for reciprocal pattern in flexor patterning for reduced extensor tone -attempted tall kneeling, however, patient with increased groin pain with this due to patient reports of staff pulling on his leg to get him out of bed  Placed sign over bed for appropriate assist with bed mobility to reduce patient's pain and risk of injury. RN made aware.  Patient in bed at end of session with breaks locked, bed alarm set, and all needs within reach.   Therapy Documentation Precautions:  Precautions Precautions: Fall, Other (comment) Precaution Comments: BLE extension hypertonicity Restrictions Weight Bearing Restrictions: No    Therapy/Group:  Individual Therapy  Travian Kerner L Merritt Mccravy PT, DPT  07/03/2021, 4:53 PM

## 2021-07-03 NOTE — Progress Notes (Signed)
Occupational Therapy Session Note  Patient Details  Name: James Olsen MRN: [id number] Date of Birth: 1946/03/17  Today's Date: 07/03/2021 OT Individual Time: 1400-1455 OT Individual Time Calculation (min): 55 min    Short Term Goals: Week 1:  OT Short Term Goal 1 (Week 1): Pt will dress LB using AE as needed with mod assist. OT Short Term Goal 2 (Week 1): Pt will complete toilet transfer with min assist. OT Short Term Goal 3 (Week 1): Pt will complete toileting tasks with mod assist.  Skilled Therapeutic Interventions/Progress Updates:    Pt supine with 4/10 pain in his R hip flexors and knee described as soreness. Pt reporting fatigue from earlier sessions and agreeable to shower for both pain relief and OT session. Pt completed bed mobility with min A to manage RLE. Sit >stand in the stedy with CGA. Transfer to [facility] over toilet first. Pt able to complete clothing management standing in stedy with CGA. Pt completed sit > stand with min A from the toilet for stedy transfer into the shower onto bariatric BSC. He completed UB bathing with supervision. LB bathing with min A for distal reaching and for forward sitting balance to reach around [facility] to wash buttocks. Pt required min cueing for body mechanics for sit > stand following shower. Shaving task completed perched at the sink in stedy with (S). Pt returned to EOB. He required mod A to don pants. Pt returned to supine and was provided with an ice pack for his R hip flexor pain. Instructed in 20/20 ice principle. Pt left supine with all needs met, bed alarm set.   Therapy Documentation Precautions:  Precautions Precautions: Fall, Other (comment) Precaution Comments: BLE extension hypertonicity Restrictions Weight Bearing Restrictions: No  Therapy/Group: Individual Therapy  [name] 07/03/2021, 6:13 AM

## 2021-07-04 MED ORDER — POLYETHYLENE GLYCOL 3350 17 G PO PACK
17.0000 g | PACK | Freq: Two times a day (BID) | ORAL | Status: DC
  Administered 2021-07-05 – 2021-07-11 (×13): 17 g via ORAL
  Filled 2021-07-04 (×15): qty 1

## 2021-07-04 MED ORDER — DEXAMETHASONE 2 MG PO TABS
2.0000 mg | ORAL_TABLET | Freq: Two times a day (BID) | ORAL | Status: DC
  Administered 2021-07-04 – 2021-07-09 (×10): 2 mg via ORAL
  Filled 2021-07-04 (×10): qty 1

## 2021-07-04 MED ORDER — BETHANECHOL CHLORIDE 25 MG PO TABS
50.0000 mg | ORAL_TABLET | Freq: Three times a day (TID) | ORAL | Status: DC
  Administered 2021-07-04 – 2021-07-08 (×12): 50 mg via ORAL
  Filled 2021-07-04 (×12): qty 2

## 2021-07-04 MED ORDER — SORBITOL 70 % SOLN
60.0000 mL | Status: AC
  Administered 2021-07-04: 60 mL via ORAL
  Filled 2021-07-04: qty 60

## 2021-07-04 NOTE — Progress Notes (Signed)
Physical Therapy Session Note  Patient Details  Name: James Olsen MRN: [id number] Date of Birth: [birth year]  Today's Date: 07/04/2021 PT Individual Time: 1105-1205 PT Individual Time Calculation (min): 60 min   Short Term Goals: Week 1:  PT Short Term Goal 1 (Week 1): Patient will perform bed mobility with min A. PT Short Term Goal 2 (Week 1): Patient will perform basic transfers with min A using LRAD. PT Short Term Goal 3 (Week 1): Patient will ambulate >100 ft using LRAD PT Short Term Goal 4 (Week 1): Patient will perform standing balance >5 min with CGA using LRAD.  Skilled Therapeutic Interventions/Progress Updates:     Patient in bed upon PT arrival. Patient alert and agreeable to PT session. Patient reported 3/10 R hip flexor/groin pain during session, RN made aware. PT provided repositioning, rest breaks, and distraction as pain interventions throughout session.   Therapeutic Activity: Bed Mobility: Patient performed supine to sit with min A in a flat bed without use of bed rail. Provided verbal cues for bending B knees to prevent extensor tone and bringing his opposite shoulder forward to bring his COM over COG. Donned B tennis shoes with total A for energy/time management. Transfers: Patient performed stand pivot bed<>w/c with min A and increased time without AD to work on balance and lower extremity motor control without relying on upper extremities. He performed sit to/from stand x3 with CGA using RW. Provided verbal cues for hand placement x1, completion of turns prior to sitting, and eccentric muscle activation for controlled descent.  Gait Training:  Doffed/donned R shoe with total A to insert 1/4" heel lift to prevent knee extension in stance to improved initiation of swing. Applied DF wrap to R foot as well for increased R toe clearance and improved gait speed. Patient ambulated 92 feet, 40 feet in front of a mirror for visual feedback, 182 feet, and 85 feet using RW, DF  wrap, and heel lift with CGA. Ambulated with improved R toe clearance and swing initiation, continues to demonstrate with R>L adductor tone with narrow BOS, decreased L weight shift (improved following visual feedback from mirror), and increased use of B upper extremities. Provided focused on patient utilizing intrinsic feedback during gait trails with summative feedback to reduce distraction and promote self correction  Patient in w/c with his wife in the room at end of session with breaks locked, seat belt alarm set, and all needs within reach.   Therapy Documentation Precautions:  Precautions Precautions: Fall, Other (comment) Precaution Comments: BLE extension hypertonicity Restrictions Weight Bearing Restrictions: No    Therapy/Group: Individual Therapy  Bijan Ridgley L Tung Pustejovsky PT, DPT  07/04/2021, 4:47 PM

## 2021-07-04 NOTE — Progress Notes (Signed)
PROGRESS NOTE   Subjective/Complaints: Had a reasonable night. Was able to void yesterday evening although it was incontinent. Otherwise has been retaining. Also feels constipated.   ROS: Patient denies fever, rash, sore throat, blurred vision, nausea, vomiting, diarrhea, cough, shortness of breath or chest pain, joint or back pain, headache, or mood change.     Objective:   No results found. No results for input(s): WBC, HGB, HCT, PLT in the last 72 hours.   No results for input(s): NA, K, CL, CO2, GLUCOSE, BUN, CREATININE, CALCIUM in the last 72 hours.    Intake/Output Summary (Last 24 hours) at 07/04/2021 1026 Last data filed at 07/04/2021 0743 Gross per 24 hour  Intake 596 ml  Output 3070 ml  Net -2474 ml        Physical Exam: Vital Signs Blood pressure 115/67, pulse 60, temperature 98.1 F (36.7 C), temperature source Oral, resp. rate 17, height 5\' 9"  (1.753 m), weight 82.3 kg, SpO2 100 %. Constitutional: No distress . Vital signs reviewed. HEENT: EOMI, oral membranes moist Neck: supple Cardiovascular: RRR without murmur. No JVD    Respiratory/Chest: CTA Bilaterally without wheezes or rales. Normal effort    GI/Abdomen: BS scarce, sl-distended Ext: no clubbing, cyanosis, or edema Psych: pleasant and cooperative  Skin: Scalp incision CDI with staples Neuro: Pt is cognitively appropriate with normal insight, memory, and awareness. Cranial nerves 2-12 are intact. Sensory exam is normal. Reflexes are 2+ UE and 3+ LE, extensor tone bilateral LE's (MAS 1-2)  R>L. UE motor 5.5 LE 2-3/5 prox to trace distally, left 0-1/5 right. --no significant motor changes Musculoskeletal: No pain with ROM   Assessment/Plan: 1. Functional deficits which require 3+ hours per day of interdisciplinary therapy in a comprehensive inpatient rehab setting. Physiatrist is providing close team supervision and 24 hour management of active  medical problems listed below. Physiatrist and rehab team continue to assess barriers to discharge/monitor patient progress toward functional and medical goals  Care Tool:  Bathing    Body parts bathed by patient: Face   Body parts bathed by helper: Buttocks (back)     Bathing assist Assist Level: Supervision/Verbal cueing     Upper Body Dressing/Undressing Upper body dressing   What is the patient wearing?: Pull over shirt    Upper body assist Assist Level: Set up assist    Lower Body Dressing/Undressing Lower body dressing      What is the patient wearing?:  (shorts)     Lower body assist Assist for lower body dressing: Maximal Assistance - Patient 25 - 49%     Toileting Toileting    Toileting assist Assist for toileting: Moderate Assistance - Patient 50 - 74%     Transfers Chair/bed transfer  Transfers assist     Chair/bed transfer assist level: Minimal Assistance - Patient > 75% (lateral scoot transfer)     Locomotion Ambulation   Ambulation assist      Assist level: Minimal Assistance - Patient > 75% Assistive device: Walker-rolling Max distance: 131 ft   Walk 10 feet activity   Assist  Walk 10 feet activity did not occur: Safety/medical concerns (limited by decreased balance and extensor hypertonicity)  Assist  level: Minimal Assistance - Patient > 75% Assistive device: Walker-rolling   Walk 50 feet activity   Assist Walk 50 feet with 2 turns activity did not occur: Safety/medical concerns  Assist level: Minimal Assistance - Patient > 75% Assistive device: Walker-rolling    Walk 150 feet activity   Assist Walk 150 feet activity did not occur: Safety/medical concerns         Walk 10 feet on uneven surface  activity   Assist Walk 10 feet on uneven surfaces activity did not occur: Safety/medical concerns         Wheelchair     Assist Will patient use wheelchair at discharge?: Yes Type of Wheelchair: Manual     Wheelchair assist level: Supervision/Verbal cueing Max wheelchair distance: 75    Wheelchair 50 feet with 2 turns activity    Assist        Assist Level: Supervision/Verbal cueing   Wheelchair 150 feet activity     Assist      Assist Level: Supervision/Verbal cueing   Blood pressure 115/67, pulse 60, temperature 98.1 F (36.7 C), temperature source Oral, resp. rate 17, height 5\' 9"  (1.753 m), weight 82.3 kg, SpO2 100 %.  Medical Problem List and Plan: 1.  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  Impaired mobility -DVT/anticoagulation:  Pharmaceutical: Continue Heparin 5,000U q8H             -antiplatelet therapy: 3. Pain Management: N/A 4. Mood: LCSW to follow for evaluation and support.              -antipsychotic agents: N/A 5. Neuropsych: This patient is capable of making decisions on his own behalf. 6. Skin/Wound Care: Remove staples 7/14  7. Fluids/Electrolytes/Nutrition: encourage PO  -solid PO 8. Spasticity: Titrate baclofen as needed, continue 10mg  tid currently  -rom, splinting.   7/12: continue baclofen10mg  qid 9. Thrombocytopenia: Monitor for signs of bleeding.  -up to 201k  10 Hyperglycemia: Likely due to steroids. -- Hgb A1C 6.0. 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 Acute blood loss anemia: Monitor for signs of bleeding with drop in plts.             --hgb holding at 11.9 again 7/11  13. Chronic R-knee pain: S/P steroid injection 03/22 14. H/o Stroke with residual: 15. GERD?: Has been refusing PPI.  16. Constipation:  -stool burden on kub, a couple liquid stools  -sorbitol   followed by SSE with good results  Continue Senna 2 tabs HS.   7/14 Give sorbitol 60cc  today followed by SSE  -increase daily regimen as well 17. Neurogenic bladder:  -pvr's 300-800cc, emptied partially, incontinently  -UC negative--   -continue flomax and increase urecholine to 50mg  tid  -rx constipation  -toilet or bsc to void     LOS: 8 days A FACE TO FACE EVALUATION WAS PERFORMED  Meredith Staggers 07/04/2021, 10:26 AM

## 2021-07-04 NOTE — Progress Notes (Signed)
Occupational Therapy Session Note  Patient Details  Name: James Olsen MRN: [id number] Date of Birth: 1946-02-13  700-800 60 min  Today's Date: 07/04/2021 OT Individual Time: 1300-1400 OT Individual Time Calculation (min): 60 min   Short Term Goals: Week 1:  OT Short Term Goal 1 (Week 1): Pt will dress LB using AE as needed with mod assist. OT Short Term Goal 2 (Week 1): Pt will complete toilet transfer with min assist. OT Short Term Goal 3 (Week 1): Pt will complete toileting tasks with mod assist.  Skilled Therapeutic Interventions/Progress Updates:     Pt received in bed with mild R hip pain. Time to initate movements of bed mobility with supervision (!) to assist with not hurting RLE, as well as, alerted RN to pt wanting tylenol. Discussed ice v heat for hip pain relief  ADL: Pt declines bathing/dressing this session Pt completes toileting with MIN A for standing balance and use of grab bar for steadying while completing clothing management in standing Pt completes toileting transfer with MIN A using grab bar to power up into standing and pivot to toilet Pt grooms seated in w/c with set up. Functional transfers with RW and functional mobility ~10' with RW with VC for RW management/proximity to RW and heel strike during stepping provided with CGA Therapeutic exercise 3x10 w/c push ups with focus on UE use instead of LE to improve BUE strengthening requried for power up during STS needed for ADLs   Pt left at end of session in w/c with exit alarm on, call light in reach and all needs met  Session 2:  Pt received in w/c agreeable to OT with no pain reported  Therapeutic exercise Pt completes 3x30 ball toss (chest, bounce, overhead pass) in seated position with 4 # wrist weights to improve BUE coordination/strengthening required for BADLs/functional transfers.  Pt completes 2x10 woodchops with minor posterior lean for core strenthening/trunk rotation   Pt completes  modified sit ups at EOM with foam wedge for core stability/trunk control   Therapeutic activity Pt completes step over 4" threshold with walker with CGA LUE and MOD facilitation RLE for hip flexion with ace wrap around RLE into dorsiflexion  Pt left at end of session in bed with exit alarm on, call light in reach and all needs met    Therapy Documentation Precautions:  Precautions Precautions: Fall, Other (comment) Precaution Comments: BLE extension hypertonicity Restrictions Weight Bearing Restrictions: No General:   Vital Signs: Therapy Vitals Temp: 98.1 F (36.7 C) Temp Source: Oral Pulse Rate: 60 Resp: 17 BP: 115/67 Patient Position (if appropriate): Lying Oxygen Therapy SpO2: 100 % O2 Device: Room Air Pain:   ADL: ADL Eating: Set up Where Assessed-Eating: Wheelchair Grooming: Setup Where Assessed-Grooming: Sitting at sink Upper Body Dressing: Minimal assistance Where Assessed-Upper Body Dressing: Sitting at sink Lower Body Dressing: Maximal assistance Where Assessed-Lower Body Dressing: Other (Comment), Wheelchair (and standing at [employer]) Toilet Transfer: Dependent (using stedy with mod assist for sit<>stand) Science writer: Bedside commode Vision   Perception    Praxis   Exercises:   Other Treatments:     Therapy/Group: Individual Therapy  Tonny Branch 07/04/2021, 6:46 AM

## 2021-07-05 MED ORDER — BACLOFEN 5 MG HALF TABLET
15.0000 mg | ORAL_TABLET | Freq: Three times a day (TID) | ORAL | Status: DC
  Administered 2021-07-05 – 2021-07-08 (×9): 15 mg via ORAL
  Filled 2021-07-05 (×9): qty 1

## 2021-07-05 NOTE — Progress Notes (Addendum)
Physical Therapy Weekly Progress Note  Patient Details  Name: James Olsen MRN: [id number] Date of Birth: December 02, 1946  Beginning of progress report period: June 27, 2021 End of progress report period: July 05, 2021  Today's Date: 07/05/2021 PT Individual Time: 7253-6644 PT Individual Time Calculation (min): 70 min   Patient has met 4 of 4 short term goals.  Patient with great progress this week. Currently performs bed mobility with min A-CGA, transfers with CGA using RW, ambulating 250 ft CGA using RW, and stairs CGA +2 with increased time for safety. Continues to present with mild posterior bias and significant gait deviations due to lower extremity extensor tone R>L, however, has progress to moving out of synergy in both limbs, however, lacks DF activation on the R.   Patient continues to demonstrate the following deficits muscle joint tightness, decreased cardiorespiratoy endurance, impaired timing and sequencing, abnormal tone, unbalanced muscle activation, and decreased coordination, and decreased standing balance, decreased postural control, hemiplegia, and decreased balance strategies and therefore will continue to benefit from skilled PT intervention to increase functional independence with mobility.  Patient progressing toward long term goals..  Plan of care revisions: upgraded long term goals to supervision-mod I overall due to patient's progress and for reduced fall risk and caregiver burden at discharge.  PT Short Term Goals Week 1:  PT Short Term Goal 1 (Week 1): Patient will perform bed mobility with min A. PT Short Term Goal 1 - Progress (Week 1): Met PT Short Term Goal 2 (Week 1): Patient will perform basic transfers with min A using LRAD. PT Short Term Goal 2 - Progress (Week 1): Met PT Short Term Goal 3 (Week 1): Patient will ambulate >100 ft using LRAD PT Short Term Goal 3 - Progress (Week 1): Met PT Short Term Goal 4 (Week 1): Patient will perform standing balance >5  min with CGA using LRAD. PT Short Term Goal 5 - Progress (Week 1): Met Week 2:  PT Short Term Goal 1 (Week 2): Patient will perform bed mobilty with supervision without hospital bed features consistently. PT Short Term Goal 2 (Week 2): Patient will perform basic transfers with supervision using LRAD. PT Short Term Goal 3 (Week 2): Patient will ambulate >300 ft during 6 min walk test with CGA using LRAD for safety. PT Short Term Goal 4 (Week 2): Patient will improved his score on the Berg by >5 point to meet the MCID and demonstrate improved standing balance and reduced fall risk.  Skilled Therapeutic Interventions/Progress Updates:     Patient in w/c in the room upon PT arrival. Patient alert and agreeable to PT session. Patient reported 1-2/10 R hip flexor/groin pain during session, RN made aware. PT provided repositioning, rest breaks, and distraction as pain interventions throughout session.   Therapeutic Activity: Bed Mobility: Patient performed supine sit to supine with supervision and min A x1 for R lower extremity to lift onto the bed. Provided verbal cues for lying in side-lying and bringing knees to chest to lift his legs onto the bed. Transfers: Patient performed sit to/from stand x5 with CGA-supervision without AD and x2 with supervision using RW. Provided verbal cues for reaching back to sit to control descent.  Gait Training:  Patient ambulated ~100 feet using RW with CGA and w/c follow for safety. Ambulated with decreased R foot clearance due to decreased DF and hamstring activation in swing, decreased L weight shift, lateral hip instability R>L, and adductor tone in swing R>L. Provided min verbal cues for  increased BOS, R hamstring activation, and leading with his heel at initial contact. Patient continues to demonstrate improvements in gait deviations and ability to self correct deviations with intrinsic feedback. 6 Min Walk Test:  Instructed patient to ambulate as quickly and as  safely as possible for 6 minutes using LRAD. Patient was allowed to take standing rest breaks without stopping the test, but if the patient required a sitting rest break the clock would be stopped and the test would be over.  Results: 249 feet (75.9 meters, Avg speed 0.66ms) using a RW. SPO2 100%, HR 83 bpm, and RPE 7-8/10 at end of test. Results indicate that the patient has reduced endurance with ambulation compared to age matched norms (527 meters).   Neuromuscular Re-ed: Patient performed the Berg Balance Test (see details below): Patient demonstrates increased fall risk as noted by score of 19/56 on Berg Balance Scale.  (<36= high risk for falls, close to 100%; 37-45 significant >80%; 46-51 moderate >50%; 52-55 lower >25%) Improved from 7/56 on evaluation on 06/27/21. Educated patient on results, interpretation, and meaningful improvement on assessment.  -Patient stood with supervision without AD x5 min, 3/5 min while holding a discussion with MD during rounding without LOB.   Removed Kinesiotape from patient's R lower limb with water for improved pain and skin integrity management at end of session, as it had been on for 3 days. Educated patient on performing self assessment of gait changes without tape during OT session this afternoon.  Patient in bed due to fatigue at end of session with breaks locked, bed alarm set, and all needs within reach.   Therapy Documentation Precautions:  Precautions Precautions: Fall, Other (comment) Precaution Comments: BLE extension hypertonicity Restrictions Weight Bearing Restrictions: No Balance: Standardized Balance Assessment Standardized Balance Assessment: Berg Balance Test Berg Balance Test Sit to Stand: Able to stand  independently using hands Standing Unsupported: Able to stand 2 minutes with supervision Sitting with Back Unsupported but Feet Supported on Floor or Stool: Able to sit safely and securely 2 minutes Stand to Sit: Sits  independently, has uncontrolled descent Transfers: Needs one person to assist Standing Unsupported with Eyes Closed: Able to stand 10 seconds with supervision Standing Ubsupported with Feet Together: Needs help to attain position and unable to hold for 15 seconds From Standing, Reach Forward with Outstretched Arm: Reaches forward but needs supervision From Standing Position, Pick up Object from Floor: Unable to pick up and needs supervision From Standing Position, Turn to Look Behind Over each Shoulder: Needs supervision when turning Turn 360 Degrees: Needs assistance while turning Standing Unsupported, Alternately Place Feet on Step/Stool: Needs assistance to keep from falling or unable to try Standing Unsupported, One Foot in Front: Needs help to step but can hold 15 seconds Standing on One Leg: Unable to try or needs assist to prevent fall Total Score: 19/56   Therapy/Group: Individual Therapy  Kwanza Cancelliere L Nicholas Trompeter PT, DPT  07/05/2021, 10:49 AM

## 2021-07-05 NOTE — Plan of Care (Signed)
  Problem: RH Balance Goal: LTG Patient will maintain dynamic standing balance (PT) Description: LTG:  Patient will maintain dynamic standing balance with assistance during mobility activities (PT) Flowsheets (Taken 07/05/2021 1114) LTG: Pt will maintain dynamic standing balance during mobility activities with:: (upgraded goal due to patient progress and for reduced caregiver burden at d/c.) Independent with assistive device  Note: upgraded goal due to patient progress and for reduced caregiver burden at d/c.   Problem: Sit to Stand Goal: LTG:  Patient will perform sit to stand with assistance level (PT) Description: LTG:  Patient will perform sit to stand with assistance level (PT) Flowsheets (Taken 07/05/2021 1114) LTG: PT will perform sit to stand in preparation for functional mobility with assistance level: (upgraded goal due to patient progress and for reduced caregiver burden at d/c.) Independent with assistive device Note: upgraded goal due to patient progress and for reduced caregiver burden at d/c.   Problem: RH Bed Mobility Goal: LTG Patient will perform bed mobility with assist (PT) Description: LTG: Patient will perform bed mobility with assistance, with/without cues (PT). Flowsheets (Taken 07/05/2021 1114) LTG: Pt will perform bed mobility with assistance level of: (upgraded goal due to patient progress and for reduced caregiver burden at d/c.) Independent Note: upgraded goal due to patient progress and for reduced caregiver burden at d/c.   Problem: RH Bed to Chair Transfers Goal: LTG Patient will perform bed/chair transfers w/assist (PT) Description: LTG: Patient will perform bed to chair transfers with assistance (PT). Flowsheets (Taken 07/05/2021 1114) LTG: Pt will perform Bed to Chair Transfers with assistance level: (upgraded goal due to patient progress and for reduced caregiver burden at d/c.) Supervision/Verbal cueing Note: upgraded goal due to patient progress and for  reduced caregiver burden at d/c.   Problem: RH Ambulation Goal: LTG Patient will ambulate in controlled environment (PT) Description: LTG: Patient will ambulate in a controlled environment, # of feet with assistance (PT). Flowsheets (Taken 07/05/2021 1114) LTG: Pt will ambulate in controlled environ  assist needed:: (upgraded goal due to patient progress and for reduced caregiver burden at d/c.) Supervision/Verbal cueing LTG: Ambulation distance in controlled environment: >350 feet during 6 min Walk Test to demonstrate improved endurance with functional mobility. Goal: LTG Patient will ambulate in home environment (PT) Description: LTG: Patient will ambulate in home environment, # of feet with assistance (PT). Flowsheets Taken 07/05/2021 1117 LTG: Pt will ambulate in home environ  assist needed:: (upgraded goal due to patient progress and for reduced caregiver burden at d/c.) Supervision/Verbal cueing Taken 06/27/2021 1717 LTG: Ambulation distance in home environment: 50 ft using LRAD Note: upgraded goal due to patient progress and for reduced caregiver burden at d/c.

## 2021-07-05 NOTE — Progress Notes (Signed)
PROGRESS NOTE   Subjective/Complaints: Up with PT working on stance, base of support, leg control. Had BM yesterday. Still not much activity with bladder.   ROS: Patient denies fever, rash, sore throat, blurred vision, nausea, vomiting, diarrhea, cough, shortness of breath or chest pain, joint or back pain, headache, or mood change.     Objective:   No results found. No results for input(s): WBC, HGB, HCT, PLT in the last 72 hours.   No results for input(s): NA, K, CL, CO2, GLUCOSE, BUN, CREATININE, CALCIUM in the last 72 hours.    Intake/Output Summary (Last 24 hours) at 07/05/2021 1040 Last data filed at 07/05/2021 0745 Gross per 24 hour  Intake 440 ml  Output 2500 ml  Net -2060 ml        Physical Exam: Vital Signs Blood pressure 99/66, pulse 65, temperature 98 F (36.7 C), temperature source Oral, resp. rate 18, height 5\' 9"  (1.753 m), weight 82.3 kg, SpO2 99 %. Constitutional: No distress . Vital signs reviewed. HEENT: EOMI, oral membranes moist Neck: supple Cardiovascular: RRR without murmur. No JVD    Respiratory/Chest: CTA Bilaterally without wheezes or rales. Normal effort    GI/Abdomen: BS +, non-tender, non-distended Ext: no clubbing, cyanosis, or edema Psych: pleasant and cooperative Skin: scalp wound CDI, staples out Neuro: Pt is cognitively appropriate with normal insight, memory, and awareness. Cranial nerves 2-12 are intact. Sensory exam is normal. Reflexes are 2+ UE and 3+ LE, extensor/flexor tone bilateral LE's (MAS 1-1+)  R>L. UE motor 5/5. RLE 3+/5 prox to 1-2/5 distally, LLE 3-4/5 prox to  2/5.  Musculoskeletal: No pain with ROM   Assessment/Plan: 1. Functional deficits which require 3+ hours per day of interdisciplinary therapy in a comprehensive inpatient rehab setting. Physiatrist is providing close team supervision and 24 hour management of active medical problems listed below. Physiatrist  and rehab team continue to assess barriers to discharge/monitor patient progress toward functional and medical goals  Care Tool:  Bathing    Body parts bathed by patient: Face   Body parts bathed by helper: Buttocks (back)     Bathing assist Assist Level: Supervision/Verbal cueing     Upper Body Dressing/Undressing Upper body dressing   What is the patient wearing?: Pull over shirt    Upper body assist Assist Level: Set up assist    Lower Body Dressing/Undressing Lower body dressing      What is the patient wearing?:  (shorts)     Lower body assist Assist for lower body dressing: Maximal Assistance - Patient 25 - 49%     Toileting Toileting    Toileting assist Assist for toileting: Moderate Assistance - Patient 50 - 74%     Transfers Chair/bed transfer  Transfers assist     Chair/bed transfer assist level: Minimal Assistance - Patient > 75% (lateral scoot transfer)     Locomotion Ambulation   Ambulation assist      Assist level: Minimal Assistance - Patient > 75% Assistive device: Walker-rolling Max distance: 131 ft   Walk 10 feet activity   Assist  Walk 10 feet activity did not occur: Safety/medical concerns (limited by decreased balance and extensor hypertonicity)  Assist level:  Minimal Assistance - Patient > 75% Assistive device: Walker-rolling   Walk 50 feet activity   Assist Walk 50 feet with 2 turns activity did not occur: Safety/medical concerns  Assist level: Minimal Assistance - Patient > 75% Assistive device: Walker-rolling    Walk 150 feet activity   Assist Walk 150 feet activity did not occur: Safety/medical concerns         Walk 10 feet on uneven surface  activity   Assist Walk 10 feet on uneven surfaces activity did not occur: Safety/medical concerns         Wheelchair     Assist Will patient use wheelchair at discharge?: Yes Type of Wheelchair: Manual    Wheelchair assist level: Supervision/Verbal  cueing Max wheelchair distance: 75    Wheelchair 50 feet with 2 turns activity    Assist        Assist Level: Supervision/Verbal cueing   Wheelchair 150 feet activity     Assist      Assist Level: Supervision/Verbal cueing   Blood pressure 99/66, pulse 65, temperature 98 F (36.7 C), temperature source Oral, resp. rate 18, height 5\' 9"  (1.753 m), weight 82.3 kg, SpO2 99 %.  Medical Problem List and Plan: 1.  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  Impaired mobility -DVT/anticoagulation:  Pharmaceutical: Continue Heparin 5,000U q8H             -antiplatelet therapy: 3. Pain Management: N/A 4. Mood: LCSW to follow for evaluation and support.              -antipsychotic agents: N/A 5. Neuropsych: This patient is capable of making decisions on his own behalf. 6. Skin/Wound Care: Remove staples 7/14  7. Fluids/Electrolytes/Nutrition: encourage PO  -solid PO 8. Spasticity: Titrate baclofen as needed, continue 10mg  tid currently  -rom, splinting.   7/15: adjust baclofen to 15mg  tid  9. Thrombocytopenia: Monitor for signs of bleeding.  -up to 201k  10 Hyperglycemia: Likely due to steroids. -- Hgb A1C 6.0. 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 Acute blood loss anemia: Monitor for signs of bleeding with drop in plts.             --hgb holding at 11.9 again 7/11   -recheck  monday 13. Chronic R-knee pain: S/P steroid injection 03/22 14. H/o Stroke with residual: 15. GERD?: Has been refusing PPI.  16. Constipation/neurogenic bowel:  -stool burden on kub, a couple liquid stools  -sorbitol   followed by [facility] with good results  Continue Senna 2 tabs HS.   7/15 had results this morning after  sorbitol, SSE   -have increased daily regimen adding miralax bid 17. Neurogenic bladder:  -pvr's/cath volumes 300-800cc, occ incontinent partial void  -UC negative--   -continue flomax and increased urecholine to 50mg  tid 7/14  -rxing constipation  -toilet or bsc to void, standing if possible     LOS: 9 days A FACE TO FACE EVALUATION WAS PERFORMED  Meredith Staggers 07/05/2021, 10:40 AM

## 2021-07-05 NOTE — Progress Notes (Signed)
Occupational Therapy Weekly Progress Note  Patient Details  Name: James Olsen MRN: [id number] Date of Birth: 1945/12/24  Beginning of progress report period: June 27, 2021 End of progress report period: July 05, 2021  Today's Date: 07/05/2021 OT Individual Time: 0700-0800 OT Individual Time Calculation (min): 60 min   Today's Date: 07/05/2021 OT Individual Time: 1315-1330 OT Individual Time Calculation (min): 15 min   Today's Date: 07/05/2021 OT Group Time: 1330-1430 OT Group Time Calculation (min): 60 min  Patient has met 3 of 3 short term goals.  Pt has made excellent progress this reporting period now requiring MIN-CGA for functional transfers with RW at ambulatory level, set up for UB dressing and MOD A for LB dressing. Pt continues to require increased A d/t extensor tone in BLE (R>L) impacting functional mobility/ADLs. Pt would benefit from continued skilled services to improve use of AE PRN, family education as well as continued NMR to restore function.  Patient continues to demonstrate the following deficits: muscle weakness, decreased cardiorespiratoy endurance, impaired timing and sequencing, abnormal tone, unbalanced muscle activation, and decreased coordination, delayed processing, and decreased standing balance, decreased postural control, and decreased balance strategies and therefore will continue to benefit from skilled OT intervention to enhance overall performance with BADL and iADL.  Patient progressing toward long term goals..  Continue plan of care.  OT Short Term Goals Week 1:  OT Short Term Goal 1 (Week 1): Pt will dress LB using AE as needed with mod assist. OT Short Term Goal 1 - Progress (Week 1): Met OT Short Term Goal 2 (Week 1): Pt will complete toilet transfer with min assist. OT Short Term Goal 2 - Progress (Week 1): Met OT Short Term Goal 3 (Week 1): Pt will complete toileting tasks with mod assist. OT Short Term Goal 3 - Progress (Week 1):  Met  Skilled Therapeutic Interventions/Progress Updates:    Session 1: Pt received in bed with 3 out of 10 pain in R hip. Increased time provided for mobility intiially to decrease pain and RN alerted to bring tylenol when available  ADL: Pt completes UB dressing with set up Pt completes LB dressing with use of reacher and A to thread RLE with reacher and VC for hemi strategy. CGA STS at sink for advancing pants past hips Pt completes footwear with total A for shoes, and MOD A for use of sock aide to don socks Pt completes toileting with CGA standing with RW, hygiene in standing with RW with MIN A Pt completes toileting transfer with MIN A for ambulation into/out of bathroom with RW and use of ace wrap for dorsiflexion in RLE. VC for wider steps and heel strike d/t increased adductor activation. Pt left at end of session in w/c with exit alarm on, call light in reach and all needs met  Session 2:  Pt received in EOB with NT and RN in room who finished cathing (pt missed 15 min d/t cathing).   Therapeutic activity OT applied inhibitory K tape to adductors. Pt uses RW for functional mobility in hallway to dayroom in prep for group. MIN facilitation of foot placement required during mobility.  Pt transported to group location at end of session in w/c.  Session 3: Pt participated in rhythmic drumming group. Pain not reported during session Focus of group on BUE coordination, strengthening, endurance, timing/control, activity tolerance, and social participation and engagement. Pt performs session from seated position for energy conservation. Skilled interventions included slowing pace of movement to improve  access to rhythm as NMR. Warm up performed prior to exercises and UB stretching completed at end of group with demo from OT. Pt able to select preferred song to share with group. Returned pt to room at end of session. Exited session with pt seated in bed, exit alarm on and call light in  reach  Therapy Documentation Precautions:  Precautions Precautions: Fall, Other (comment) Precaution Comments: BLE extension hypertonicity Restrictions Weight Bearing Restrictions: No General:   Vital Signs: Therapy Vitals Temp: 98 F (36.7 C) Temp Source: Oral Pulse Rate: 65 Resp: 18 BP: 99/66 Patient Position (if appropriate): Lying Oxygen Therapy SpO2: 99 % O2 Device: Room Air Pain:   ADL: ADL Eating: Set up Where Assessed-Eating: Wheelchair Grooming: Setup Where Assessed-Grooming: Sitting at sink Upper Body Dressing: Minimal assistance Where Assessed-Upper Body Dressing: Sitting at sink Lower Body Dressing: Maximal assistance Where Assessed-Lower Body Dressing: Other (Comment), Wheelchair (and standing at [employer]) Toilet Transfer: Dependent (using stedy with mod assist for sit<>stand) Science writer: Bedside commode Vision   Perception    Praxis   Exercises:   Other Treatments:     Therapy/Group: Individual Therapy and Group Therapy  Tonny Branch 07/05/2021, 6:47 AM

## 2021-07-06 MED ORDER — TAMSULOSIN HCL 0.4 MG PO CAPS
0.8000 mg | ORAL_CAPSULE | Freq: Every day | ORAL | Status: DC
  Administered 2021-07-07: 0.8 mg via ORAL
  Filled 2021-07-06: qty 2

## 2021-07-06 NOTE — Progress Notes (Signed)
PROGRESS NOTE   Subjective/Complaints:  Hasn't been able to void yet- getting cathed at least 3-4x/day.  LBM yesterday- going daily.  Eating well- denies pain and HA's.  Muscle pull in R leg tylenol prn helps.   ROS:   Pt denies SOB, abd pain, CP, N/V/C/D, and vision changes    Objective:   No results found. No results for input(s): WBC, HGB, HCT, PLT in the last 72 hours.   No results for input(s): NA, K, CL, CO2, GLUCOSE, BUN, CREATININE, CALCIUM in the last 72 hours.    Intake/Output Summary (Last 24 hours) at 07/06/2021 1828 Last data filed at 07/06/2021 1508 Gross per 24 hour  Intake 240 ml  Output 2057 ml  Net -1817 ml        Physical Exam: Vital Signs Blood pressure 104/72, pulse 68, temperature 97.9 F (36.6 C), temperature source Oral, resp. rate 18, height 5\' 9"  (1.753 m), weight 82.3 kg, SpO2 100 %.    General: awake, alert, appropriate, NAD HENT: conjugate gaze; oropharynx moist- crani site from 1 side to the other- posterior aspect of skull- looks good CV: regular rate; no JVD Pulmonary: CTA B/L; no W/R/R- good air movement GI: soft, NT, ND, (+)BS Psychiatric: appropriate; interactive Neurological: alert  Ext: no clubbing, cyanosis, or edema Psych: pleasant and cooperative Skin: scalp wound CDI, staples out Neuro: Pt is cognitively appropriate with normal insight, memory, and awareness. Cranial nerves 2-12 are intact. Sensory exam is normal. Reflexes are 2+ UE and 3+ LE, extensor/flexor tone bilateral LE's (MAS 1-1+)  R>L. UE motor 5/5. RLE 3+/5 prox to 1-2/5 distally, LLE 3-4/5 prox to  2/5.  Musculoskeletal: No pain with ROM   Assessment/Plan: 1. Functional deficits which require 3+ hours per day of interdisciplinary therapy in a comprehensive inpatient rehab setting. Physiatrist is providing close team supervision and 24 hour management of active medical problems listed  below. Physiatrist and rehab team continue to assess barriers to discharge/monitor patient progress toward functional and medical goals  Care Tool:  Bathing    Body parts bathed by patient: Face   Body parts bathed by helper: Buttocks (back)     Bathing assist Assist Level: Supervision/Verbal cueing     Upper Body Dressing/Undressing Upper body dressing   What is the patient wearing?: Pull over shirt    Upper body assist Assist Level: Set up assist    Lower Body Dressing/Undressing Lower body dressing      What is the patient wearing?:  (shorts)     Lower body assist Assist for lower body dressing: Maximal Assistance - Patient 25 - 49%     Toileting Toileting    Toileting assist Assist for toileting: Moderate Assistance - Patient 50 - 74%     Transfers Chair/bed transfer  Transfers assist     Chair/bed transfer assist level: Contact Guard/Touching assist Chair/bed transfer assistive device: Programmer, multimedia   Ambulation assist      Assist level: Contact Guard/Touching assist Assistive device: Walker-rolling Max distance: 250 ft   Walk 10 feet activity   Assist  Walk 10 feet activity did not occur: Safety/medical concerns (limited by decreased balance and extensor hypertonicity)  Assist level: Contact Guard/Touching assist Assistive device: Walker-rolling   Walk 50 feet activity   Assist Walk 50 feet with 2 turns activity did not occur: Safety/medical concerns  Assist level: Contact Guard/Touching assist Assistive device: Walker-rolling    Walk 150 feet activity   Assist Walk 150 feet activity did not occur: Safety/medical concerns  Assist level: Contact Guard/Touching assist Assistive device: Walker-rolling    Walk 10 feet on uneven surface  activity   Assist Walk 10 feet on uneven surfaces activity did not occur: Safety/medical concerns         Wheelchair     Assist Will patient use wheelchair at  discharge?: Yes Type of Wheelchair: Manual    Wheelchair assist level: Supervision/Verbal cueing Max wheelchair distance: 75    Wheelchair 50 feet with 2 turns activity    Assist        Assist Level: Supervision/Verbal cueing   Wheelchair 150 feet activity     Assist      Assist Level: Supervision/Verbal cueing   Blood pressure 104/72, pulse 68, temperature 97.9 F (36.6 C), temperature source Oral, resp. rate 18, height 5\' 9"  (1.753 m), weight 82.3 kg, SpO2 100 %.  Medical Problem List and Plan: 1.  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  Impaired mobility -DVT/anticoagulation:  Pharmaceutical: Continue Heparin 5,000U q8H             -antiplatelet therapy: 3. Pain Management: N/A 4. Mood: LCSW to follow for evaluation and support.              -antipsychotic agents: N/A 5. Neuropsych: This patient is capable of making decisions on his own behalf. 6. Skin/Wound Care: Remove staples 7/14  7. Fluids/Electrolytes/Nutrition: encourage PO  -solid PO 8. Spasticity: Titrate baclofen as needed, continue 10mg  tid currently  -rom, splinting.   7/15: adjust baclofen to 15mg  tid  9. Thrombocytopenia: Monitor for signs of bleeding.  -up to 201k  10 Hyperglycemia: Likely due to steroids. -- Hgb A1C 6.0. 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 Acute blood loss anemia: Monitor for signs of bleeding with drop in plts.             --hgb holding at 11.9 again 7/11   -recheck  monday 13. Chronic R-knee pain: S/P steroid injection 03/22 14. H/o Stroke with residual: 15. GERD?: Has been refusing PPI.  16. Constipation/neurogenic bowel:  -stool burden on kub, a couple liquid stools  -sorbitol   followed by SSE with good  results  Continue Senna 2 tabs HS.   7/15 had results this morning after sorbitol, SSE   -have increased daily regimen adding miralax bid  7/16- now going daily- con't reigmen 17. Neurogenic bladder:  -pvr's/cath volumes 300-800cc, occ incontinent partial void  -UC negative--   -continue flomax and increased urecholine to 50mg  tid 7/14  -rxing constipation  -toilet or bsc to void, standing if possible  7/16- taught pt bladder tapping- increase Flomax to 0.8 mg nightly.     LOS: 10 days A FACE TO FACE EVALUATION WAS PERFORMED  James Olsen 07/06/2021, 6:28 PM

## 2021-07-07 NOTE — Progress Notes (Signed)
Physical Therapy Session Note  Patient Details  Name: James Olsen MRN: [id number] Date of Birth: November 28, 1946  Today's Date: 07/07/2021 PT Individual Time: 0973-5329 PT Individual Time Calculation (min): 78 min   Short Term Goals: Week 2:  PT Short Term Goal 1 (Week 2): Patient will perform bed mobilty with supervision without hospital bed features consistently. PT Short Term Goal 2 (Week 2): Patient will perform basic transfers with supervision using LRAD. PT Short Term Goal 3 (Week 2): Patient will ambulate >300 ft during 6 min walk test with CGA using LRAD for safety. PT Short Term Goal 4 (Week 2): Patient will improved his score on the Berg by >5 point to meet the MCID and demonstrate improved standing balance and reduced fall risk.  Skilled Therapeutic Interventions/Progress Updates:     Patient in bed upon PT arrival. Patient alert and agreeable to PT session. Patient denied pain during session, continues to have intermittent hip flexor soreness, reports it is improving. Patient expressed frustration due to asking to go to the bathroom >1 hour ago and asking to get cleaned up this morning. Patient was told he had to wait for therapy to get cleaned up.   Therapeutic Activity: Bed Mobility: Patient performed supine to/from sit with supervision and increased time due to B lower extremity extensor tone, increased tone this session R>L due to day off from therapy and minimal OOB mobility yesterday. Provided verbal cues for B knees to chest to improved rolling and bringing legs off the bed due to extensor tone. Transfers: Patient performed sit to/from stand with CGA x2 and supervision x6 using RW or B stair rails. Provided verbal cues for scooting forward, foot placement and forward weight shift x2, patient demonstrated these using teach back x1 and without cuing for remainder of transfers.  Gait Training:  Patient ambulated 232 feet x2 using RW with CGA. Ambulated with reciprocal gait  pattern, decreased R hip flexion, knee flexion, and DF limiting R foot clearance in swing, decreased L weight shift, mild lateral hip instability, increased adductor tone R>L, and increased upper extremity support with downward head gaze. Provided min-no cues for hamstring activation, L weight shift, R hip abduction, and R foot clearance during gait, provided cues in summative feedback  after trials for patient to utilize intrinsic feedback to self-correct during gait training. Second trial patient had R PLS AFO and toe cover donned with improved gait speed and R foot clearance.  Patient ascended/descended 4 steps x2 using B handrails with min A. Performed reciprocal gait pattern to initiate automatic stepping pattern on R with great success while ascending , needing very little assist to bring R foot up to the next step and min A to facilitate R knee bend and L foot placement on descent and mod A to block R knee from buckling. Provided cues for technique and sequencing.   Neuromuscular Re-education: Patient performed the following activities for improved lower extremity motor control with focus on improving gait deviations during gait training: -mini squats with resistance at distal lateral thighs for cuing abductor activation x5 -R hamstring curls x10 with tactile target to facilitate knee flexion rather than hip flexion -DF/knee flexion on second 6" step of stairs using B HRs focused progressive ROM with 10 sec hold x5 -seated hip abduction with resistance band around thighs 2x10  Patient in w/c in the room at end of session with breaks locked, chair alarm set, and all needs within reach.   Therapy Documentation Precautions:  Precautions Precautions: Fall,  Other (comment) Precaution Comments: BLE extension hypertonicity Restrictions Weight Bearing Restrictions: No    Therapy/Group: Individual Therapy  [name] PT, DPT  07/07/2021, 11:56 AM

## 2021-07-07 NOTE — Progress Notes (Signed)
Occupational Therapy Session Note  Patient Details  Name: James Olsen MRN: [id number] Date of Birth: Jan 18, 1946  Today's Date: 07/07/2021 OT Individual Time: 5284-1324 OT Individual Time Calculation (min): 58 min    Short Term Goals: Week 2:  OT Short Term Goal 1 (Week 2): Pt will consistently transfer to toilet with no more than CGA OT Short Term Goal 2 (Week 2): Pt will don footwear with AE PRN and no more than MIN A OT Short Term Goal 3 (Week 2): Pt will complete 3/3 components of toileting wiht CGA for balance only OT Short Term Goal 4 (Week 2): Pt will bathe 10/10 body part with AE PRN and CGA for balance only  Skilled Therapeutic Interventions/Progress Updates:    Pt received supine, agreeable to OT at alternate time than scheduled, reporting no pain. Session focused on BADLs, functional ambulation, and standing balance/weight shifting. Sup<>sit close spvsn with increased time for initiation of movement in RLE. Sitting EOB, pt donned tennis shoes mod A: pt able to use figure 4 technique but required min A to maintain with BLEs as he pulled shoes over feet; used stool under RLE to tie shoe; tied L shoe without any compensatory strategies with foot on floor. Sit<>stand with RW close spvsn-CGA throughout session. Pt requested functional ambulation to gym with RW; ambulated ~200' with multiple turns without rest break, self-correcting throughout trial for decreased RLE clearance due to decreased hip/knee FLX and dorsiflexion from increased extensor tone; heavy use of BUEs on RW. Pt fatigued after trial and required seated rest break. Pt engaged in standing balance activities using Biodex to improve independence in functional mobility and BADLs for safe d/c home. Pt took one 5-6" step up (min A) and down (mod A) with BUEs supported on Biodex; increased time and slight manual facilitation of RLE required for stepping down due to large step. Activities focused on pt increasing awareness towards  COG, compensatory weight shifting to L due to increased extensor tone in RLE, and practicing weight shifting in all directions following visual cues/targets. Pt demoed difficulties weight shifting posteriorly to heels as well as to R side, often attempting to compensate for this by increased trunk FLX/EXT. Pt fatigued after ~4 minutes standing on Biodex. Pt returned to room to engage in sponge bath sitting at sink in w/c (set up A) and oral hygiene (mod I). Stand step transfer with no AD min-mod A and HHA with min vc's for BLE positioning. UB dressing set up A. Pt remained supine, alarm set, call bell in reach, and all immediate needs met.   Therapy Documentation Precautions:  Precautions Precautions: Fall, Other (comment) Precaution Comments: BLE extension hypertonicity Restrictions Weight Bearing Restrictions: No  Pain: Pain Assessment Pain Scale: 0-10 Pain Score: 0-No pain   Therapy/Group: Individual Therapy  Mellissa Kohut 07/07/2021, 7:59 AM

## 2021-07-08 LAB — BASIC METABOLIC PANEL
Anion gap: 8 (ref 5–15)
BUN: 21 mg/dL (ref 8–23)
CO2: 25 mmol/L (ref 22–32)
Calcium: 8.9 mg/dL (ref 8.9–10.3)
Chloride: 100 mmol/L (ref 98–111)
Creatinine, Ser: 0.94 mg/dL (ref 0.61–1.24)
GFR, Estimated: >60 mL/min (ref >60)
Glucose, Bld: 162 mg/dL — ABNORMAL HIGH (ref 70–99)
Potassium: 3.9 mmol/L (ref 3.5–5.1)
Sodium: 133 mmol/L — ABNORMAL LOW (ref 135–145)

## 2021-07-08 LAB — CBC
HCT: 39.3 % (ref 39.0–52.0)
Hemoglobin: 12.9 g/dL — ABNORMAL LOW (ref 13.0–17.0)
MCH: 29.9 pg (ref 26.0–34.0)
MCHC: 32.8 g/dL (ref 30.0–36.0)
MCV: 91.2 fL (ref 80.0–100.0)
Platelets: 230 10*3/uL (ref 150–400)
RBC: 4.31 MIL/uL (ref 4.22–5.81)
RDW: 13.5 % (ref 11.5–15.5)
WBC: 6.3 10*3/uL (ref 4.0–10.5)
nRBC: 0 % (ref 0.0–0.2)

## 2021-07-08 MED ORDER — BETHANECHOL CHLORIDE 25 MG PO TABS
50.0000 mg | ORAL_TABLET | Freq: Four times a day (QID) | ORAL | Status: DC
  Administered 2021-07-08 – 2021-07-18 (×40): 50 mg via ORAL
  Filled 2021-07-08 (×40): qty 2

## 2021-07-08 MED ORDER — TAMSULOSIN HCL 0.4 MG PO CAPS
0.4000 mg | ORAL_CAPSULE | Freq: Every day | ORAL | Status: DC
  Administered 2021-07-08 – 2021-07-17 (×10): 0.4 mg via ORAL
  Filled 2021-07-08 (×10): qty 1

## 2021-07-08 MED ORDER — BACLOFEN 10 MG PO TABS
10.0000 mg | ORAL_TABLET | Freq: Three times a day (TID) | ORAL | Status: DC
  Administered 2021-07-08 – 2021-07-18 (×30): 10 mg via ORAL
  Filled 2021-07-08 (×30): qty 1

## 2021-07-08 NOTE — Progress Notes (Signed)
Occupational Therapy Session Note  Patient Details  Name: James Olsen MRN: [id number] Date of Birth: [birth year]  Today's Date: 07/08/2021 OT Individual Time: 0626-9485 OT Individual Time Calculation (min): 60 min   Session 2: OT Individual Time: 1130-1200 OT Individual Time Calculation (min): 30 min    Short Term Goals: Week 2:  OT Short Term Goal 1 (Week 2): Pt will consistently transfer to toilet with no more than CGA OT Short Term Goal 2 (Week 2): Pt will don footwear with AE PRN and no more than MIN A OT Short Term Goal 3 (Week 2): Pt will complete 3/3 components of toileting wiht CGA for balance only OT Short Term Goal 4 (Week 2): Pt will bathe 10/10 body part with AE PRN and CGA for balance only  Skilled Therapeutic Interventions/Progress Updates:    Pt received on the toilet with NT present. Pt was unable to void BM. He completed ambulatory transfer into the shower with min A, using the RW. Mod cueing for sequencing turning into the shower with increased time required for pt to motor plan RLE dorsiflexion and knee flexion. Pt completed UB bathing set up assist seated in shower. Min A to stand in the shower for peri hygiene. Pt completed ambulatory transfer out of shower and to w/c with min cueing for R foot placement during gait and increasing BOS. Pt completed oral care at the sink with set up assist. BP assessed- 110/67, as pt relayed concerns of dizziness after taking medication/eating, pt doesn't think its transfer/positionally related. UB dressing set up assist. Mod A for LB dressing, without AE. Discussed compensatory vs restorative approaches. Pt able to don shoes and socks with min A. Pt left sitting up with all needs met.   Session 2: Pt received supine, with extensive discussion re today's events of dizziness and possible dehydration. Reinforced need to increase water intake and still attempt to void urine. Pt agreeable to get OOB and try to void on commode. Pt required  increased time from fatigue this session. Mod A to stand from EOB and then min A for ambulatory transfer into the bathroom with the RW. Toileting tasks with min A. Pt returned to supine in bed and was left with all needs met, bed alarm set.    Therapy Documentation Precautions:  Precautions Precautions: Fall, Other (comment) Precaution Comments: BLE extension hypertonicity Restrictions Weight Bearing Restrictions: No  Therapy/Group: Individual Therapy  [name] 07/08/2021, 5:59 AM

## 2021-07-08 NOTE — Progress Notes (Signed)
Physical Therapy Session Note  Patient Details  Name: James Olsen MRN: [id number] Date of Birth: [birth year]  Today's Date: 07/08/2021 PT Individual Time: 1005-1100 PT Individual Time Calculation (min): 55 min   Short Term Goals: Week 2:  PT Short Term Goal 1 (Week 2): Patient will perform bed mobilty with supervision without hospital bed features consistently. PT Short Term Goal 2 (Week 2): Patient will perform basic transfers with supervision using LRAD. PT Short Term Goal 3 (Week 2): Patient will ambulate >300 ft during 6 min walk test with CGA using LRAD for safety. PT Short Term Goal 4 (Week 2): Patient will improved his score on the Berg by >5 point to meet the MCID and demonstrate improved standing balance and reduced fall risk.  Skilled Therapeutic Interventions/Progress Updates:     Patient in w/c in the room upon PT arrival. Patient alert and agreeable to PT session. Patient denied pain during session.  Patient became light headed during gait training, also appeared more fatigued and SOB than previous sessions. Orthostatic vitals below. Patient reports that he has not been drinking very many fluids due to in/out catheterizations and bladder discomfort. Educated on importance of drinking fluids and symptoms of dehydration. Patient stated understanding and nursing staff made aware of patient's concerns regarding catheterizations.   Orthostatic Vitals: Sitting: BP 122/80, HR 70, SPO2 100% (asymptomatic) Standing: BP 90/73, HR 82, SPO2 98% (symptomatic) Standing x3 min: BP 114/75, HR 79, SPO2 99% (symptoms improving)  Therapeutic Activity: Bed Mobility: Patient performed sit to supine with supervision in a flat bed without use of bed rail.  Transfers: Patient performed sit to/from stand x2 with RW and x1 without AD with supervision and increased time for boosting up without AD. Provided verbal cues for forward weight shift and pushing his toes into the floor to prevent  posterior bias.  Gait Training:  Patient ambulated 120 feet and 60 feet using RW with CGA and 60 feet using B HHA with therapist in front for reduced use of upper extremities during gait. Patient with PLS AFO donned for second and third gait trails. Ambulated with improved clearance of R foot both with and without AFO, only brushing his foot without the AFO rather than intermittently catching as in previous sessions. Provided verbal cues for leading with R heel at initial contact for increased DF for foot clearance and step length, and looking ahead as able due to proprioceptive deficits. Patient stopped during gait trail with B HHA and began to oscillate in standing and reported "lightheadedness" when asked about symptoms.    Spent increased time discussing bladder control challenges with catheterizations. Instructed patient and nursing staff to allow patient to attempt to void in standing prior to catheterizations, and not void bed level or seated, for improved urinary tract alignment and more automatic voiding response. Also, discussed concerns about dehydration and patient requested ice water at end of session and reported that he would increase his fluid intake throughout the day. Educated on slow sips frequently versus consuming large quantities all at once, patient stated understanding.   Patient in bed at end of session with breaks locked, bed alarm set, and all needs within reach.   Therapy Documentation Precautions:  Precautions Precautions: Fall, Other (comment) Precaution Comments: BLE extension hypertonicity Restrictions Weight Bearing Restrictions: No    Therapy/Group: Individual Therapy  [name] PT, DPT  07/08/2021, 7:35 PM

## 2021-07-08 NOTE — Progress Notes (Signed)
PROGRESS NOTE   Subjective/Complaints:  Having gas this morning. Still no voids. Volumes 600cc with I/O caths. Feeling light-headed at times after taking meds.   ROS: Patient denies fever, rash, sore throat,  nausea, vomiting, diarrhea, cough, shortness of breath or chest pain, joint or back pain, headache, or mood change.       Objective:   No results found. Recent Labs    07/08/21 0817  WBC 6.3  HGB 12.9*  HCT 39.3  PLT 230     Recent Labs    07/08/21 0817  NA 133*  K 3.9  CL 100  CO2 25  GLUCOSE 162*  BUN 21  CREATININE 0.94  CALCIUM 8.9      Intake/Output Summary (Last 24 hours) at 07/08/2021 1137 Last data filed at 07/08/2021 0800 Gross per 24 hour  Intake 300 ml  Output 2400 ml  Net -2100 ml        Physical Exam: Vital Signs Blood pressure 98/61, pulse 65, temperature 97.8 F (36.6 C), temperature source Oral, resp. rate 18, height 5\' 9"  (1.753 m), weight 82.3 kg, SpO2 98 %.    Constitutional: No distress . Vital signs reviewed. HEENT: EOMI, oral membranes moist Neck: supple Cardiovascular: RRR without murmur. No JVD    Respiratory/Chest: CTA Bilaterally without wheezes or rales. Normal effort    GI/Abdomen: BS +, non-tender, non-distended Ext: no clubbing, cyanosis, or edema Psych: pleasant and cooperative  Skin: scalp wound CDI, staples out Neuro: Pt is cognitively appropriate with normal insight, memory, and awareness. Cranial nerves 2-12 are intact. Sensory exam is normal. Reflexes are 2+ UE and 3+ LE, extensor/flexor tone bilateral LE's (MAS 1-1+)  R>L. UE motor 5/5. RLE 3+/5 prox to 1-2/5 distally, LLE 3-4/5 prox to  2/5--stable.  Musculoskeletal: No pain with ROM   Assessment/Plan: 1. Functional deficits which require 3+ hours per day of interdisciplinary therapy in a comprehensive inpatient rehab setting. Physiatrist is providing close team supervision and 24 hour management  of active medical problems listed below. Physiatrist and rehab team continue to assess barriers to discharge/monitor patient progress toward functional and medical goals  Care Tool:  Bathing    Body parts bathed by patient: Face, Abdomen, Chest, Left arm, Right arm   Body parts bathed by helper: Buttocks (back)     Bathing assist Assist Level: Set up assist     Upper Body Dressing/Undressing Upper body dressing   What is the patient wearing?: Pull over shirt    Upper body assist Assist Level: Set up assist    Lower Body Dressing/Undressing Lower body dressing      What is the patient wearing?:  (shorts)     Lower body assist Assist for lower body dressing: Maximal Assistance - Patient 25 - 49%     Toileting Toileting    Toileting assist Assist for toileting: Moderate Assistance - Patient 50 - 74%     Transfers Chair/bed transfer  Transfers assist     Chair/bed transfer assist level: Contact Guard/Touching assist Chair/bed transfer assistive device: Programmer, multimedia   Ambulation assist      Assist level: Contact Guard/Touching assist Assistive device: Walker-rolling Max distance: 250  ft   Walk 10 feet activity   Assist  Walk 10 feet activity did not occur: Safety/medical concerns (limited by decreased balance and extensor hypertonicity)  Assist level: Contact Guard/Touching assist Assistive device: Walker-rolling   Walk 50 feet activity   Assist Walk 50 feet with 2 turns activity did not occur: Safety/medical concerns  Assist level: Contact Guard/Touching assist Assistive device: Walker-rolling    Walk 150 feet activity   Assist Walk 150 feet activity did not occur: Safety/medical concerns  Assist level: Contact Guard/Touching assist Assistive device: Walker-rolling    Walk 10 feet on uneven surface  activity   Assist Walk 10 feet on uneven surfaces activity did not occur: Safety/medical concerns          Wheelchair     Assist Will patient use wheelchair at discharge?: Yes Type of Wheelchair: Manual    Wheelchair assist level: Supervision/Verbal cueing Max wheelchair distance: 75    Wheelchair 50 feet with 2 turns activity    Assist        Assist Level: Supervision/Verbal cueing   Wheelchair 150 feet activity     Assist      Assist Level: Supervision/Verbal cueing   Blood pressure 98/61, pulse 65, temperature 97.8 F (36.6 C), temperature source Oral, resp. rate 18, height 5\' 9"  (1.753 m), weight 82.3 kg, SpO2 98 %.  Medical Problem List and Plan: 1.  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  Impaired mobility -DVT/anticoagulation:  Pharmaceutical: Continue Heparin 5,000U q8H             -antiplatelet therapy: 3. Pain Management: N/A 4. Mood: LCSW to follow for evaluation and support.              -antipsychotic agents: N/A 5. Neuropsych: This patient is capable of making decisions on his own behalf. 6. Skin/Wound Care: Remove staples 7/14  7. Fluids/Electrolytes/Nutrition: encourage PO  -solid PO 8. Spasticity:   baclofen   -rom, splinting.   7/18 suspect baclofen is making him feel dizzy---cut back to 10mg  tid 9. Thrombocytopenia: Monitor for signs of bleeding.  -up to 201k  10 Hyperglycemia: Likely due to steroids. -- Hgb A1C 6.0. 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 Acute blood loss anemia: Monitor for signs of bleeding with drop in plts.             --hgb holding at 11.9 again 7/11   -recheck  monday 13. Chronic R-knee pain: S/P steroid injection 03/22 14. H/o Stroke with residual: 15. GERD?: Has been refusing PPI.  16. Constipation/neurogenic bowel:  -stool burden  on kub, a couple liquid stools  -sorbitol   followed by SSE with good results  Continue Senna 2 tabs HS.   7/15 had results this morning after sorbitol, SSE   -have increased daily regimen adding miralax bid  7/18-  going daily- con't reigmen 17. Neurogenic bladder:  -pvr's/cath volumes 300-800cc, occ incontinent partial void  -UC negative--   -continue flomax and increased urecholine to 50mg  tid 7/14  -rxing constipation  -toilet or bsc to void, standing if possible  7/18 no voids yet despite flomax increase Saturday--likely contributing  to dizziness along with baclofen---go back to 0.4mg    -educate on self-caths   -increase urecholine to qid   -outpt urology consult    LOS: 12 days A FACE TO [place] 07/08/2021, 11:37 AM

## 2021-07-08 NOTE — Plan of Care (Signed)
Goals upgraded d/t pt progress  Problem: RH Balance Goal: LTG: Patient will maintain dynamic sitting balance (OT) Description: LTG:  Patient will maintain dynamic sitting balance with assistance during activities of daily living (OT) Outcome: Completed/Met Goal: LTG Patient will maintain dynamic standing with ADLs (OT) Description: LTG:  Patient will maintain dynamic standing balance with assist during activities of daily living (OT)  Flowsheets (Taken 07/08/2021 1252) LTG: Pt will maintain dynamic standing balance during ADLs with: (upgraded 7/18- SD) Supervision/Verbal cueing Note: upgraded 7/18- SD   Problem: RH Balance Goal: LTG Patient will maintain dynamic standing with ADLs (OT) Description: LTG:  Patient will maintain dynamic standing balance with assist during activities of daily living (OT)  Flowsheets (Taken 07/08/2021 1252) LTG: Pt will maintain dynamic standing balance during ADLs with: (upgraded 7/18- SD) Supervision/Verbal cueing Note: upgraded 7/18- SD   Problem: Sit to Stand Goal: LTG:  Patient will perform sit to stand in prep for activites of daily living with assistance level (OT) Description: LTG:  Patient will perform sit to stand in prep for activites of daily living with assistance level (OT) Flowsheets (Taken 07/08/2021 1252) LTG: PT will perform sit to stand in prep for activites of daily living with assistance level: (upgraded 7/18- SD) Supervision/Verbal cueing Note: upgraded 7/18- SD   Problem: RH Bathing Goal: LTG Patient will bathe all body parts with assist levels (OT) Description: LTG: Patient will bathe all body parts with assist levels (OT) Flowsheets (Taken 07/08/2021 1252) LTG: Pt will perform bathing with assistance level/cueing: (upgraded 7/18- SD) Supervision/Verbal cueing Note: upgraded 7/18- SD   Problem: RH Dressing Goal: LTG Patient will perform upper body dressing (OT) Description: LTG Patient will perform upper body dressing with assist,  with/without cues (OT). Outcome: Completed/Met Goal: LTG Patient will perform lower body dressing w/assist (OT) Description: LTG: Patient will perform lower body dressing with assist, with/without cues in positioning using equipment (OT) Flowsheets (Taken 07/08/2021 1252) LTG: Pt will perform lower body dressing with assistance level of: (upgraded 7/18- SD) Supervision/Verbal cueing Note: upgraded 7/18- SD   Problem: RH Toileting Goal: LTG Patient will perform toileting task (3/3 steps) with assistance level (OT) Description: LTG: Patient will perform toileting task (3/3 steps) with assistance level (OT)  Flowsheets (Taken 07/08/2021 1252) LTG: Pt will perform toileting task (3/3 steps) with assistance level: (upgraded 7/18- SD) Supervision/Verbal cueing Note: upgraded 7/18- SD   Problem: RH Toilet Transfers Goal: LTG Patient will perform toilet transfers w/assist (OT) Description: LTG: Patient will perform toilet transfers with assist, with/without cues using equipment (OT) Flowsheets (Taken 07/08/2021 1252) LTG: Pt will perform toilet transfers with assistance level of: (upgraded 7/18- SD) Supervision/Verbal cueing Note: upgraded 7/18- SD   Problem: RH Tub/Shower Transfers Goal: LTG Patient will perform tub/shower transfers w/assist (OT) Description: LTG: Patient will perform tub/shower transfers with assist, with/without cues using equipment (OT) Flowsheets (Taken 07/08/2021 1252) LTG: Pt will perform tub/shower stall transfers with assistance level of: (upgraded 7/18- SD) Supervision/Verbal cueing Note: upgraded 7/18- SD

## 2021-07-08 NOTE — Progress Notes (Signed)
Occupational Therapy Session Note  Patient Details  Name: James Olsen MRN: [id number] Date of Birth: 08/25/1946  Today's Date: 07/08/2021 OT Group Time: 1430-1530 OT Group Time Calculation (min): 60 min   Short Term Goals: Week 2:  OT Short Term Goal 1 (Week 2): Pt will consistently transfer to toilet with no more than CGA OT Short Term Goal 2 (Week 2): Pt will don footwear with AE PRN and no more than MIN A OT Short Term Goal 3 (Week 2): Pt will complete 3/3 components of toileting wiht CGA for balance only OT Short Term Goal 4 (Week 2): Pt will bathe 10/10 body part with AE PRN and CGA for balance only  Skilled Therapeutic Interventions/Progress Updates:  Pt was seen for skilled group session with focus of group session on  stress management, social interaction, and  education on healthy coping strategies.  Pt reported no pain during session, pt declined standing activity during session. Pt cooperative, interactive and appreciative of group session. Pt transported back to room by this OTA with total A, pt completed stand pivot back to bed with MIN A, where pt was left supine in bed with all needs within reach and bed alarm activated.    Therapy Documentation Precautions:  Precautions Precautions: Fall, Other (comment) Precaution Comments: BLE extension hypertonicity Restrictions Weight Bearing Restrictions: No General:   Vital Signs: Therapy Vitals Temp: 98 F (36.7 C) Temp Source: Oral Pulse Rate: 66 Resp: 15 BP: 103/67 Patient Position (if appropriate): Lying Oxygen Therapy SpO2: 100 % O2 Device: Room Air Pain: Pt reports no pain during group session.     Therapy/Group: Group Therapy  Precious Haws 07/08/2021, 4:11 PM

## 2021-07-09 MED ORDER — DEXAMETHASONE 2 MG PO TABS
1.0000 mg | ORAL_TABLET | Freq: Two times a day (BID) | ORAL | Status: DC
  Administered 2021-07-09 – 2021-07-14 (×10): 1 mg via ORAL
  Filled 2021-07-09 (×10): qty 1

## 2021-07-09 NOTE — Progress Notes (Signed)
PROGRESS NOTE   Subjective/Complaints:  Dizziness better? Admits to not drinking as much to avoid large bladder volumes. Had 700cc caths yesterday associated with discomfort.   ROS: Patient denies fever, rash, sore throat, blurred vision, nausea, vomiting, diarrhea, cough, shortness of breath or chest pain, joint or back pain, headache, or mood change.        Objective:   No results found. Recent Labs    07/08/21 0817  WBC 6.3  HGB 12.9*  HCT 39.3  PLT 230     Recent Labs    07/08/21 0817  NA 133*  K 3.9  CL 100  CO2 25  GLUCOSE 162*  BUN 21  CREATININE 0.94  CALCIUM 8.9      Intake/Output Summary (Last 24 hours) at 07/09/2021 1116 Last data filed at 07/09/2021 0839 Gross per 24 hour  Intake 720 ml  Output 2025 ml  Net -1305 ml        Physical Exam: Vital Signs Blood pressure 101/61, pulse 60, temperature 98 F (36.7 C), temperature source Oral, resp. rate 16, height 5\' 9"  (1.753 m), weight 82.3 kg, SpO2 100 %.    Constitutional: No distress . Vital signs reviewed. HEENT: EOMI, oral membranes moist Neck: supple Cardiovascular: RRR without murmur. No JVD    Respiratory/Chest: CTA Bilaterally without wheezes or rales. Normal effort    GI/Abdomen: BS +, non-tender, non-distended Ext: no clubbing, cyanosis, or edema Psych: pleasant and cooperative   Skin: scalp wound CDI, staples out Neuro: Pt is cognitively appropriate with normal insight, memory, and awareness. Cranial nerves 2-12 are intact. Sensory exam is normal. Reflexes are 2+ UE and 3+ LE, extensor/flexor tone bilateral LE's (MAS 1-1+)  R>L. UE motor 5/5. RLE 3+/5 prox to 1-2/5 distally, LLE 3-4/5 prox to  2/5--no significant changes apparent.  Musculoskeletal: No pain with ROM   Assessment/Plan: 1. Functional deficits which require 3+ hours per day of interdisciplinary therapy in a comprehensive inpatient rehab setting. Physiatrist is  providing close team supervision and 24 hour management of active medical problems listed below. Physiatrist and rehab team continue to assess barriers to discharge/monitor patient progress toward functional and medical goals  Care Tool:  Bathing    Body parts bathed by patient: Face, Abdomen, Chest, Left arm, Right arm   Body parts bathed by helper: Buttocks (back)     Bathing assist Assist Level: Set up assist     Upper Body Dressing/Undressing Upper body dressing   What is the patient wearing?: Pull over shirt    Upper body assist Assist Level: Set up assist    Lower Body Dressing/Undressing Lower body dressing      What is the patient wearing?:  (shorts)     Lower body assist Assist for lower body dressing: Maximal Assistance - Patient 25 - 49%     Toileting Toileting    Toileting assist Assist for toileting: Moderate Assistance - Patient 50 - 74%     Transfers Chair/bed transfer  Transfers assist     Chair/bed transfer assist level: Contact Guard/Touching assist Chair/bed transfer assistive device: Programmer, multimedia   Ambulation assist      Assist level: Contact Guard/Touching  assist Assistive device: Walker-rolling Max distance: 250 ft   Walk 10 feet activity   Assist  Walk 10 feet activity did not occur: Safety/medical concerns (limited by decreased balance and extensor hypertonicity)  Assist level: Contact Guard/Touching assist Assistive device: Walker-rolling   Walk 50 feet activity   Assist Walk 50 feet with 2 turns activity did not occur: Safety/medical concerns  Assist level: Contact Guard/Touching assist Assistive device: Walker-rolling    Walk 150 feet activity   Assist Walk 150 feet activity did not occur: Safety/medical concerns  Assist level: Contact Guard/Touching assist Assistive device: Walker-rolling    Walk 10 feet on uneven surface  activity   Assist Walk 10 feet on uneven surfaces activity  did not occur: Safety/medical concerns         Wheelchair     Assist Will patient use wheelchair at discharge?: Yes Type of Wheelchair: Manual    Wheelchair assist level: Supervision/Verbal cueing Max wheelchair distance: 75    Wheelchair 50 feet with 2 turns activity    Assist        Assist Level: Supervision/Verbal cueing   Wheelchair 150 feet activity     Assist      Assist Level: Supervision/Verbal cueing   Blood pressure 101/61, pulse 60, temperature 98 F (36.7 C), temperature source Oral, resp. rate 16, height 5\' 9"  (1.753 m), weight 82.3 kg, SpO2 100 %.  Medical Problem List and Plan: 1.  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  Impaired mobility -DVT/anticoagulation:  Pharmaceutical: Continue Heparin 5,000U q8H             -antiplatelet therapy: 3. Pain Management: N/A 4. Mood: LCSW to follow for evaluation and support.              -antipsychotic agents: N/A 5. Neuropsych: This patient is capable of making decisions on his own behalf. 6. Skin/Wound Care: Remove staples 7/14  7. Fluids/Electrolytes/Nutrition: encourage PO  -solid PO 8. Spasticity:   baclofen   -rom, splinting.   7/19 baclofen reduced to 10mg  tid d/t dizziness, hypotension 9. Thrombocytopenia: Monitor for signs of bleeding.  -up to 201k  10 Hyperglycemia: Likely due to steroids. -- Hgb A1C 6.0. 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 Acute blood loss anemia: Monitor for signs of bleeding with drop in plts.             --hgb holding at 11.9 again 7/11   -recheck  monday 13. Chronic R-knee pain: S/P steroid injection 03/22 14. H/o Stroke with residual: 15. GERD?: Has been refusing PPI.   16. Constipation/neurogenic bowel:  -stool burden on kub, a couple liquid stools  -sorbitol   followed by SSE with good results  Continue Senna 2 tabs HS.   7/15 had results this morning after sorbitol, SSE   -have increased daily regimen adding miralax bid  7/19-  regular pattern now 17. Neurogenic bladder:  -pvr's/cath volumes 300-800cc, occ incontinent partial void  -UC negative--   -continue flomax and increased urecholine to 50mg  tid 7/14  -rxing constipation  -toilet or bsc to void, standing if possible  7/19. Flomax reduced d/t  hypotension   -need to keep cath volumes between 300-500cc. Discouraged water "restriction"   -educate on self-caths   -increased urecholine to qid   -outpt urology consult    LOS: 13 days A FACE TO [place] 07/09/2021, 11:16 AM

## 2021-07-09 NOTE — Progress Notes (Signed)
Occupational Therapy Session Note  Patient Details  Name: James Olsen MRN: [id number] Date of Birth: 08-14-1946  Today's Date: 07/09/2021 OT Individual Time: 7106-2694 OT Individual Time Calculation (min): 60 min   Session 2: OT Individual Time: 8546-2703 OT Individual Time Calculation (min): 40 min    Short Term Goals: Week 2:  OT Short Term Goal 1 (Week 2): Pt will consistently transfer to toilet with no more than CGA OT Short Term Goal 2 (Week 2): Pt will don footwear with AE PRN and no more than MIN A OT Short Term Goal 3 (Week 2): Pt will complete 3/3 components of toileting wiht CGA for balance only OT Short Term Goal 4 (Week 2): Pt will bathe 10/10 body part with AE PRN and CGA for balance only  Skilled Therapeutic Interventions/Progress Updates:    Pt received supine with NT performing bladder scan. Discussed voiding trials and pt reporting he was able to void yesterday and today- about 100 cc. Reinforced with pt and NT need for pt to stand. Pt completed bed mobility with increased effort/time and CGA. Sit > stand with CGA and pivot to w/c with min A. In the therapy gym, pt stood in the parallel bars to focus on hip flexion and hip abduction, with graded assistance provided- mod-max A. Second trial with great improvement in hip flexion, moderate use of compensatory trunk movements, only min A required. Pt returned to his room via 150 ft of functional mobility with the RW, cueing required for increased R foot clearance and swing through, min A overall. Pt transferred to toilet and NT entered to take over supervision.   Session 2:  Pt received supine with no c/o pain, reporting fatigue but agreeable to OT session focused on shower. Pt completed bed mobility with CGA to EOB. He required total A for donning socks d/t fatigue. He required mod A to stand from EOB and min A for ambulatory transfer into the shower, min facilitation for RLE clearance. Min cueing for sequencing transfer  with lateral stepping and min facilitation for R LE adduction. Pt completed bathing seated on the BSC in the shower with set up assist for UB and min A for standing level peri hygiene. Pt donned shirt with set up assist. He required mod A to don socks. He transferred to the w/c and NT was alerted to pt being ready for bladder scan. Pt left sitting up with NT present.    Therapy Documentation Precautions:  Precautions Precautions: Fall, Other (comment) Precaution Comments: BLE extension hypertonicity Restrictions Weight Bearing Restrictions: No  Therapy/Group: Individual Therapy  [name] 07/09/2021, 6:06 AM

## 2021-07-09 NOTE — Progress Notes (Signed)
Physical Therapy Session Note  Patient Details  Name: James Olsen MRN: [id number] Date of Birth: [birth year]  Today's Date: 07/09/2021 PT Individual Time: 7622-6333 PT Individual Time Calculation (min): 26 min   Short Term Goals: Week 2:  PT Short Term Goal 1 (Week 2): Patient will perform bed mobilty with supervision without hospital bed features consistently. PT Short Term Goal 2 (Week 2): Patient will perform basic transfers with supervision using LRAD. PT Short Term Goal 3 (Week 2): Patient will ambulate >300 ft during 6 min walk test with CGA using LRAD for safety. PT Short Term Goal 4 (Week 2): Patient will improved his score on the Berg by >5 point to meet the MCID and demonstrate improved standing balance and reduced fall risk.  Skilled Therapeutic Interventions/Progress Updates:     Pt received seated in WC and agrees to therapy, though reports significant fatigue from "long day of therapy" and requesting session that is "not too strenuous on R leg". WC transport to gym for time management. Pt performs stand pivot transfer to mat table with modA and cues on body mechanics and sequencing. Sit to supine to prone with minA. Pt positioned in prone for passive stretch of R hip flexors. Progression to PT providing manual stretch of R quad and gradual incorporation of R hip extension for increased hip flexor involvement. Pt reports "good response" to stretches. Pt then performs prone press ups with verbal cue to maintain contact between hips and mat for stretching of anterior muscle groups. Pt completes 2x10 press ups with rest break between. Prone to supine with minA and cues on sequencing. Stand pivot from mat>WC>bed with modA. Sit to supine with minA. Left supine with alarm intact and all needs within reach.  Therapy Documentation Precautions:  Precautions Precautions: Fall, Other (comment) Precaution Comments: BLE extension hypertonicity Restrictions Weight Bearing Restrictions:  No  Therapy/Group: Individual Therapy  Breck Coons, PT, DPT 07/09/2021, 3:42 PM

## 2021-07-09 NOTE — Progress Notes (Signed)
Occupational Therapy Session Note  Patient Details  Name: James Olsen MRN: [id number] Date of Birth: 03/08/1946  Today's Date: 07/09/2021 OT Individual Time: 1035-1100 OT Individual Time Calculation (min): 25 min    Short Term Goals: Week 1:  OT Short Term Goal 1 (Week 1): Pt will dress LB using AE as needed with mod assist. OT Short Term Goal 1 - Progress (Week 1): Met OT Short Term Goal 2 (Week 1): Pt will complete toilet transfer with min assist. OT Short Term Goal 2 - Progress (Week 1): Met OT Short Term Goal 3 (Week 1): Pt will complete toileting tasks with mod assist. OT Short Term Goal 3 - Progress (Week 1): Met  Skilled Therapeutic Interventions/Progress Updates:    Pt resting in w/c upon arrival. OT intervention with focus on sit<>stand, squats, and seated marching. Sit<>stand with CGA. Squats using RW 1x5, 1x8, and 1 x10 with rest breaks. Seated marching in w/c with facilitation for RLE. Pt remained seated in w/c with seat belt activated and all needs within reach.   Therapy Documentation Precautions:  Precautions Precautions: Fall, Other (comment) Precaution Comments: BLE extension hypertonicity Restrictions Weight Bearing Restrictions: No   Pain: Pain Assessment Pain Scale: 0-10 Pain Score: 0-No pain   Therapy/Group: Individual Therapy  Leroy Libman 07/09/2021, 11:06 AM

## 2021-07-09 NOTE — Patient Care Conference (Signed)
Inpatient RehabilitationTeam Conference and Plan of Care Update Date: 07/09/2021   Time: 10:14 AM    Patient Name: James Olsen      Medical Record Number: [id number]  Date of Birth: 05/25/1946 Sex: Male         Room/Bed: 4W13C/4W13C-01 Payor Info: Payor: MEDICARE / Plan: MEDICARE PART A AND B / Product Type: *No Product type* /    Admit Date/Time:  06/26/2021  3:14 PM  Primary Diagnosis:  Atypical meningioma of brain [facility])  Hospital Problems: Principal Problem:   Atypical meningioma of brain [facility]) Active Problems:   S/P resection of meningioma    Expected Discharge Date: Expected Discharge Date: 07/18/21  Team Members Present: Physician leading conference: Dr. Alger Simons Care Coodinator Present: Loralee Pacas, LCSWA;Trei Schoch Creig Hines, RN, BSN, CRRN Nurse Present: Dorthula Nettles, RN PT Present: Tereasa Coop, PT OT Present: Laverle Hobby, OT PPS Coordinator present : Gunnar Fusi, SLP     Current Status/Progress Goal Weekly Team Focus  Bowel/Bladder   pt is continent of B?B. Urinary retention -bladder scan q 6 hours, straight cath .  Pt will be able to urinate with out cath  assess q shift and PRN   Swallow/Nutrition/ Hydration             ADL's   Mod A LB ADLs, set up UB ADLs, min A transfers  upgraded to supervision  ADLs, transfers, safety, d/c planning   Mobility   Supervision bed mobility, CGA-suprevision transfers and gait with RW, progressing to gait without RW with min A BHHA, stairs CGA-min A,  new orthostatic hypotension (pt reports  not drinking fluids)  Upgraded to mod I-supervision overall for reduced caregiver burden  Lower extremity tone management/NMR, gait and stair training, functional mobility, activity tolerance, balance, patient/caregiver education   Communication             Safety/Cognition/ Behavioral Observations            Pain   denies pain  pain<3  assess pain q shift and PRN   Skin   staplea are removed. skin is clean and dry.   incision site will remain free from infection  assess skin q shift and PRN     Discharge Planning:  D/c to home with 24/7 care from wife.   Team Discussion: Having side effects from the Baclofen. On the max dose of Urecholine. Need to cath at a max of 400-500cc, not let it go over that. Nursing is I&O cathing q 6 hr. No reports of pain. Incision to head is open to air. Patient on target to meet rehab goals: yes, making good progress. Mod assist for lower body ADL's, set-up for upper body. Min assist for transfers. Goals have been upgraded to supervision. Supervision for transfers, walked > 250 ft with RW and min assist,  *See Care Plan and progress notes for long and short-term goals.   Revisions to Treatment Plan:  Allow patient to stand to attempt to void. Don't go over 400-500cc in bladder.  Teaching Needs: Family education, medication management, I&O cath education, skin/wound care, transfer training, gait training, stair training, balance training, endurance training, safety awareness.  Current Barriers to Discharge: Decreased caregiver support, Medical stability, Home enviroment access/layout, Incontinence, Wound care, Lack of/limited family support, Weight, Weight bearing restrictions, Medication compliance, and Behavior  Possible Resolutions to Barriers: Continue current medications, provide emotional support.     Medical Summary Current Status: improved motor LE. still with tone in LE's. urine retention on max meds.  pt to learn self-caths (vs foley)  Barriers to Discharge: Medical stability   Possible Resolutions to [employer] Focus: ongoing medical mgt of bladder, training for self-caths. daily assessment of labs/pt data   Continued Need for Acute Rehabilitation Level of Care: The patient requires daily medical management by a physician with specialized training in physical medicine and rehabilitation for the following reasons: Direction of a multidisciplinary physical  rehabilitation program to maximize functional independence : Yes Medical management of patient stability for increased activity during participation in an intensive rehabilitation regime.: Yes Analysis of laboratory values and/or radiology reports with any subsequent need for medication adjustment and/or medical intervention. : Yes   I attest that I was present, lead the team conference, and concur with the assessment and plan of the team.   Cristi Loron 07/09/2021, 4:00 PM

## 2021-07-09 NOTE — Progress Notes (Signed)
Patient ID: James Olsen, male   DOB: May 02, 1946, 75 y.o.   MRN: [id number]  SW met with pt in room to provide updates from team conference, and no change in d/c date. SW discussed with pt that he will either d/c with I/O cath or foley catheter. SW discussed the current physical support he requires, and if his wife is able to manage. He supports that his wife is not able to physically help as much. SW indicated will f/u with his wife.   1740-SW spoke with pt wife to discuss above. Wife did express concerns about providing physical support as she has a pinched sciatic nerve. SW encouraged family education for her to be able to see his progress, and areas he may require help. Family education scheduled for Fri Pamalee Leyden, MSW, [place] Office: [phone] Cell: [phone] Fax: [phone]

## 2021-07-09 NOTE — Progress Notes (Signed)
Physical Therapy Session Note  Patient Details  Name: James Olsen MRN: [id number] Date of Birth: 17-Jan-1946  Today's Date: 07/09/2021 PT Individual Time: 1130-1200 PT Individual Time Calculation (min): 30 min   Short Term Goals: Week 2:  PT Short Term Goal 1 (Week 2): Patient will perform bed mobilty with supervision without hospital bed features consistently. PT Short Term Goal 2 (Week 2): Patient will perform basic transfers with supervision using LRAD. PT Short Term Goal 3 (Week 2): Patient will ambulate >300 ft during 6 min walk test with CGA using LRAD for safety. PT Short Term Goal 4 (Week 2): Patient will improved his score on the Berg by >5 point to meet the MCID and demonstrate improved standing balance and reduced fall risk.  Skilled Therapeutic Interventions/Progress Updates:    Patient in w/c in room and denies pain.  Assisted in w/c to dayroom.  Patient sit to stand at rail on wall with CGA.  Standing hamstring curls with A x 10, hip flexion x 10 and hip abduction with A x 10 with bilateral UE support.  Forward march with 1 hand on wall rail on L, then turning and back with R hand on rail and L HHA x 14' x 2.  Patient seated for R ankle PF 10 with manual resistance and manual stretch to PF's w/ 30 sec hold x 3.  Standing with both feet on wedge for stretch x 1 minute with bilat UE support.  Assisted in w/c to room.  Patient requesting back to bed despite lunch coming reports can use the urinal easier.  Squat pivot min A to bed and to supine with A for R LE.  Patient left with call bell and lunch tray in reach and bed alarm active.   Therapy Documentation Precautions:  Precautions Precautions: Fall, Other (comment) Precaution Comments: BLE extension hypertonicity Restrictions Weight Bearing Restrictions: No  Pain: Pain Assessment Pain Score: 0-No pain   Therapy/Group: Individual Therapy  Reginia Naas Magda Kiel, PT 07/09/2021, 12:37 PM

## 2021-07-09 NOTE — Progress Notes (Signed)
Patient educated on self cath and use of sterile gloves. Patient verbalized understanding and agrees to demonstrate next time cath is due.

## 2021-07-10 DIAGNOSIS — R339 Retention of urine, unspecified: Secondary | ICD-10-CM

## 2021-07-10 NOTE — Progress Notes (Signed)
Patient ID: James Olsen, male   DOB: 09-Apr-1946, 75 y.o.   MRN: [id number]  SW provided pt with HHA list in the event this is recommended. SW informed team has not decided [facility] or outpatient at this time.   Loralee Pacas, MSW, [place] Office: [phone] Cell: [phone] Fax: [phone]

## 2021-07-10 NOTE — Progress Notes (Signed)
PROGRESS NOTE   Subjective/Complaints:  Dizziness improved. Had bm yesterday.  Says he had partial spontaneous voids. Flow sheet seems to indicate 2-3 episodes where e emptied with 200+cc residual.   ROS: Patient denies fever, rash, sore throat, blurred vision, nausea, vomiting, diarrhea, cough, shortness of breath or chest pain, joint or back pain, headache, or mood change.       Objective:   No results found. Recent Labs    07/08/21 0817  WBC 6.3  HGB 12.9*  HCT 39.3  PLT 230     Recent Labs    07/08/21 0817  NA 133*  K 3.9  CL 100  CO2 25  GLUCOSE 162*  BUN 21  CREATININE 0.94  CALCIUM 8.9      Intake/Output Summary (Last 24 hours) at 07/10/2021 0835 Last data filed at 07/10/2021 0733 Gross per 24 hour  Intake 600 ml  Output 1537 ml  Net -937 ml        Physical Exam: Vital Signs Blood pressure 99/61, pulse 69, temperature 98.4 F (36.9 C), resp. rate 18, height 5\' 9"  (1.753 m), weight 82.3 kg, SpO2 100 %.    Constitutional: No distress . Vital signs reviewed. HEENT: EOMI, oral membranes moist Neck: supple Cardiovascular: RRR without murmur. No JVD    Respiratory/Chest: CTA Bilaterally without wheezes or rales. Normal effort    GI/Abdomen: BS +, non-tender, non-distended Ext: no clubbing, cyanosis, or edema Psych: pleasant and cooperative  Skin: scalp wound CDI, staples out Neuro: Pt is cognitively appropriate with normal insight, memory, and awareness. Cranial nerves 2-12 are intact. Sensory exam is normal. Reflexes are 2+ UE and 3+ LE, extensor/flexor tone bilateral LE's (MAS 1-1+)  R>L. UE motor 5/5. RLE 3+/5 prox to 1-2/5 distally, LLE 3-4/5 prox to  2/5--NO significant changes apparent.  Musculoskeletal: No pain with ROM. R>L heel cord tightness   Assessment/Plan: 1. Functional deficits which require 3+ hours per day of interdisciplinary therapy in a comprehensive inpatient rehab  setting. Physiatrist is providing close team supervision and 24 hour management of active medical problems listed below. Physiatrist and rehab team continue to assess barriers to discharge/monitor patient progress toward functional and medical goals  Care Tool:  Bathing    Body parts bathed by patient: Face, Abdomen, Chest, Left arm, Right arm   Body parts bathed by helper: Buttocks (back)     Bathing assist Assist Level: Set up assist     Upper Body Dressing/Undressing Upper body dressing   What is the patient wearing?: Pull over shirt    Upper body assist Assist Level: Set up assist    Lower Body Dressing/Undressing Lower body dressing      What is the patient wearing?:  (shorts)     Lower body assist Assist for lower body dressing: Maximal Assistance - Patient 25 - 49%     Toileting Toileting    Toileting assist Assist for toileting: Moderate Assistance - Patient 50 - 74%     Transfers Chair/bed transfer  Transfers assist     Chair/bed transfer assist level: Contact Guard/Touching assist Chair/bed transfer assistive device: Programmer, multimedia   Ambulation assist  Assist level: Contact Guard/Touching assist Assistive device: Walker-rolling Max distance: 250 ft   Walk 10 feet activity   Assist  Walk 10 feet activity did not occur: Safety/medical concerns (limited by decreased balance and extensor hypertonicity)  Assist level: Contact Guard/Touching assist Assistive device: Walker-rolling   Walk 50 feet activity   Assist Walk 50 feet with 2 turns activity did not occur: Safety/medical concerns  Assist level: Contact Guard/Touching assist Assistive device: Walker-rolling    Walk 150 feet activity   Assist Walk 150 feet activity did not occur: Safety/medical concerns  Assist level: Contact Guard/Touching assist Assistive device: Walker-rolling    Walk 10 feet on uneven surface  activity   Assist Walk 10 feet on  uneven surfaces activity did not occur: Safety/medical concerns         Wheelchair     Assist Will patient use wheelchair at discharge?: Yes Type of Wheelchair: Manual    Wheelchair assist level: Supervision/Verbal cueing Max wheelchair distance: 75    Wheelchair 50 feet with 2 turns activity    Assist        Assist Level: Supervision/Verbal cueing   Wheelchair 150 feet activity     Assist      Assist Level: Supervision/Verbal cueing   Blood pressure 99/61, pulse 69, temperature 98.4 F (36.9 C), resp. rate 18, height 5\' 9"  (1.753 m), weight 82.3 kg, SpO2 100 %.  Medical Problem List and Plan: 1.  Decline in ADL and Mobility  secondary to Atypical meningioma with bilateral craniotomy/tumor resection 06/21/21  -weaning steroids, currently 2mg  q8H             -patient [place]             -ELOS/Goals: 07/18/21,  Min A  -Continue CIR therapies including PT, OT,   -PRAFo's for BLE 2.  Impaired mobility -DVT/anticoagulation:  Pharmaceutical: Continue Heparin 5,000U q8H             -antiplatelet therapy: 3. Pain Management: N/A 4. Mood: LCSW to follow for evaluation and support.              -antipsychotic agents: N/A 5. Neuropsych: This patient is capable of making decisions on his own behalf. 6. Skin/Wound Care: Remove staples 7/14  7. Fluids/Electrolytes/Nutrition: encourage PO  -solid PO 8. Spasticity:   baclofen   -rom, splinting. Reviewed today with pt  7/19 baclofen reduced to 10mg  tid d/t dizziness, hypotension 9. Thrombocytopenia: Monitor for signs of bleeding.  -up to 201k  10 Hyperglycemia: Likely due to steroids. -- Hgb A1C 6.0. 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 Acute blood loss anemia: Monitor for signs of bleeding with drop in plts.             --hgb holding at 11.9 again 7/11   -recheck  monday 13. Chronic R-knee pain: S/P steroid injection 03/22 14. H/o Stroke with residual: 15. GERD?: Has been refusing  PPI.  16. Constipation/neurogenic bowel:  -stool burden on kub, a couple liquid stools  -sorbitol   followed by SSE with good results  Continue Senna 2 tabs HS.   7/15 had results this morning after sorbitol, SSE   -have increased daily regimen adding miralax bid  7/19-  regular pattern now 17. Neurogenic bladder:  -pvr's/cath volumes 300-800cc, occ incontinent partial void  -UC negative--   -continue flomax and increased urecholine to 50mg  tid 7/14  -rxing constipation  -toilet or bsc to void, standing if possible  7/20.  Pt learning  self caths   -appears to have had some spontaneous voids yesterday also   -need to keep cath volumes between 300-500cc. Discouraged water "restriction"   -  urecholine 50mg  qid   -outpt urology consult likely    LOS: 14 days A FACE TO [place] 07/10/2021, 8:35 AM

## 2021-07-10 NOTE — Progress Notes (Signed)
Occupational Therapy Session Note  Patient Details  Name: James Olsen MRN: [id number] Date of Birth: 09/27/1946  Today's Date: 07/10/2021 OT Individual Time: 1345-1440 OT Individual Time Calculation (min): 55 min    Short Term Goals: Week 1:  OT Short Term Goal 1 (Week 1): Pt will dress LB using AE as needed with mod assist. OT Short Term Goal 1 - Progress (Week 1): Met OT Short Term Goal 2 (Week 1): Pt will complete toilet transfer with min assist. OT Short Term Goal 2 - Progress (Week 1): Met OT Short Term Goal 3 (Week 1): Pt will complete toileting tasks with mod assist. OT Short Term Goal 3 - Progress (Week 1): Met Week 2:  OT Short Term Goal 1 (Week 2): Pt will consistently transfer to toilet with no more than CGA OT Short Term Goal 2 (Week 2): Pt will don footwear with AE PRN and no more than MIN A OT Short Term Goal 3 (Week 2): Pt will complete 3/3 components of toileting wiht CGA for balance only OT Short Term Goal 4 (Week 2): Pt will bathe 10/10 body part with AE PRN and CGA for balance only  Skilled Therapeutic Interventions/Progress Updates:     Pt received in direct handoff from previous OT. Pt with no pain reported. Functional mobility with RW and AFO in L shoe into room from dayroom with CGA to power up and ambualte with good safety awareness.   ADL: Pt gathers clothing items seated in w/c for rest break prior to ambulating to shower with CGA for balance and MIN A to side step/adduct RLE into shower stall Pt completes bathing with MIN A to power up into standing for bathing bottocks.  Pt completes UB dressing with set up Pt completes LB dressing with reacher use/VC for technique and CGA + multiple attempts for threading Les. Pt unable ot recall hemi strategy/requires cuing Pt completes footwear with MIN A to position Les into figure 4 d/t fatigue Pt with small incontinent BM in shower and completes posterior hygiene in standing with MIN A using grab bar  Pt left  at end of session in w/c awaiting PT session next, call light in reach and all needs met   Therapy Documentation Precautions:  Precautions Precautions: Fall, Other (comment) Precaution Comments: BLE extension hypertonicity Restrictions Weight Bearing Restrictions: No General:   Vital Signs: Therapy Vitals Temp: 97.8 F (36.6 C) Pulse Rate: 66 Resp: 20 BP: 103/63 Patient Position (if appropriate): Lying Oxygen Therapy SpO2: 100 % O2 Device: Room Air Pain:   ADL: ADL Eating: Set up Where Assessed-Eating: Wheelchair Grooming: Setup Where Assessed-Grooming: Sitting at sink Upper Body Dressing: Minimal assistance Where Assessed-Upper Body Dressing: Sitting at sink Lower Body Dressing: Maximal assistance Where Assessed-Lower Body Dressing: Other (Comment), Wheelchair (and standing at stedy) Toilet Transfer: Dependent (using stedy with mod assist for sit<>stand) Toilet Transfer Equipment: Bedside commode Vision   Perception    Praxis   Exercises:   Other Treatments:     Therapy/Group: Individual Therapy   M  07/10/2021, 6:52 AM 

## 2021-07-10 NOTE — Progress Notes (Addendum)
Physical Therapy Session Note  Patient Details  Name: James Olsen MRN: [id number] Date of Birth: December 22, 1946  Today's Date: 07/10/2021 PT Individual Time: 2426-8341 and 1455-1535 PT Individual Time Calculation (min): 70 min and 40 min  Short Term Goals: Week 2:  PT Short Term Goal 1 (Week 2): Patient will perform bed mobilty with supervision without hospital bed features consistently. PT Short Term Goal 2 (Week 2): Patient will perform basic transfers with supervision using LRAD. PT Short Term Goal 3 (Week 2): Patient will ambulate >300 ft during 6 min walk test with CGA using LRAD for safety. PT Short Term Goal 4 (Week 2): Patient will improved his score on the Berg by >5 point to meet the MCID and demonstrate improved standing balance and reduced fall risk.  Skilled Therapeutic Interventions/Progress Updates:     Session 1: Patient in w/c ready for therapy upon PT arrival. Patient alert and agreeable to PT session. Patient denied pain during session, reported increased gastroc/soleus tightness and lateral hip tightness this morning.  Therapeutic Activity: Transfers: Patient performed sit to/from stand x6 with supervision-CGA without AD. Provided min verbal and intermittent tactile cues for forward weight shift and hip extension on R.  Gait Training:  Patient performed the following gait trials focused on decreased upper extremity support during gait and improved balance: -ambulated 45 feet using B upper HHA on therapist's arms with min A -ambulated 30 feet with 2x180 deg turns x2 with a mirror for visual feedback without AD with min A and mod A x2 for posterior R LOB and increased time for initiation of stepping without upper extremity support -ambulated 40 feet walking laps back and forth in the // bars for reduced fear of falling with single HHA with min A-CGA with a mirror for visual feedback -ambulated 20 feet with single upper extremity support outside of // bars with a mirror  for visual feedback with min A and mod A to transfer to sitting in a chair, continues to demonstrate hesitant gait with signs of fear of falling without // bars Patient ambulated in non-skid socks for first 2 trials then donned B socks, tennis shoes, and R PLS AFO for remainder of gait trials. Ambulated with reciprocal gait pattern, decreased R hip flexion, knee flexion, and DF limiting R foot clearance in swing, decreased R weight shift, lateral hip instability R>L (exaggerated without upper extremity support), and increased adductor tone R>L. Provided min cues for hamstring activation, L weight shift, R hip abduction, and R foot clearance during gait, provided cues in summative feedback after trials for patient to utilize intrinsic and visual feedback to self-correct during gait training.  Patient in w/c in the room at end of session with breaks locked, chair alarm set, and all needs within reach.   Session 2: Patient in w/c in the room upon PT arrival. Patient alert and agreeable to PT session. Patient denied pain during session, reported increased fatigue with back-to-back sessions. Focused session on R lower extremity tone management. Utilized heat modalities, prolonged stretching, and patient self-selected relaxation music (smooth jazz) to reduce muscle tone and tension during session. Applied moist hot pack to muscle to be stretched and maintained stretch 2-3 min each for hip flexor/rectus femorus in supine with R lower leg bent off the mat, gluteals with focus on glute med in side-lying with cross body stretch, gastroc soleus with massage for muscle relaxation alone with heat and gentle posterior glide of the talus in supine, hamstring in supine with gentle gastroc stretch.  Patient performed lateral scoot transfers to minimize exacerbation of muscle tone w/c<>mat table and w/c>bed. Patient with LOB on return to bed and required max A to transition to supine due to landing on the w/c tire during  transfer. Pain in bed with CSW in the room at end of session with breaks locked, bed alarm set, and all needs in reach.   Therapy Documentation Precautions:  Precautions Precautions: Fall, Other (comment) Precaution Comments: BLE extension hypertonicity Restrictions Weight Bearing Restrictions: No    Therapy/Group: Individual Therapy  Mate Alegria L Sashia Campas PT, DPT  07/10/2021, 4:07 PM

## 2021-07-10 NOTE — Progress Notes (Signed)
Occupational Therapy Session Note  Patient Details  Name: James Olsen MRN: [id number] Date of Birth: 03-12-1946  Today's Date: 07/10/2021 OT Individual Time: 1300-1345 OT Individual Time Calculation (min): 45 min    Short Term Goals: Week 2:  OT Short Term Goal 1 (Week 2): Pt will consistently transfer to toilet with no more than CGA OT Short Term Goal 2 (Week 2): Pt will don footwear with AE PRN and no more than MIN A OT Short Term Goal 3 (Week 2): Pt will complete 3/3 components of toileting wiht CGA for balance only OT Short Term Goal 4 (Week 2): Pt will bathe 10/10 body part with AE PRN and CGA for balance only  Skilled Therapeutic Interventions/Progress Updates:    Pt received sitting in the w/c with no c/o pain. He requested to void urine in standing- he required min A for balance support but was able to manage clothing and urinal himself, also stabilization on the RW provided. 50 cc voided. In the therapy gym pt transferred into quadruped on the mat, requiring min A to get there. From here, worked on core stabilization, knee and hip flexion, and dynamic reaching. Pt was able to hold modified plank with B forearms on a therapy ball. Activities graded to increase/decrease challenges. Pt passed off to OT in the hallway.   Therapy Documentation Precautions:  Precautions Precautions: Fall, Other (comment) Precaution Comments: BLE extension hypertonicity Restrictions Weight Bearing Restrictions: No   Therapy/Group: Individual Therapy  [name] 07/10/2021, 6:30 AM

## 2021-07-10 NOTE — Progress Notes (Signed)
Pt demonstrated self cath and use of sterile gloves.

## 2021-07-11 NOTE — Progress Notes (Signed)
PROGRESS NOTE   Subjective/Complaints:  Up with therapy. Dizziness resolved. Is emptying bladder somewhat. Has cathed also  ROS: Patient denies fever, rash, sore throat, blurred vision, nausea, vomiting, diarrhea, cough, shortness of breath or chest pain, joint or back pain, headache, or mood change.       Objective:   No results found. No results for input(s): WBC, HGB, HCT, PLT in the last 72 hours.    No results for input(s): NA, K, CL, CO2, GLUCOSE, BUN, CREATININE, CALCIUM in the last 72 hours.     Intake/Output Summary (Last 24 hours) at 07/11/2021 1023 Last data filed at 07/11/2021 0805 Gross per 24 hour  Intake 960 ml  Output 1070 ml  Net -110 ml        Physical Exam: Vital Signs Blood pressure (!) 106/59, pulse 72, temperature 98.2 F (36.8 C), temperature source Oral, resp. rate 18, height 5\' 9"  (1.753 m), weight 80.3 kg, SpO2 99 %.    Constitutional: No distress . Vital signs reviewed. HEENT: EOMI, oral membranes moist Neck: supple Cardiovascular: RRR without murmur. No JVD    Respiratory/Chest: CTA Bilaterally without wheezes or rales. Normal effort    GI/Abdomen: BS +, non-tender, non-distended Ext: no clubbing, cyanosis, or edema Psych: pleasant and cooperative   Skin: scalp wound CDI, staples out Neuro: Pt is cognitively appropriate with normal insight, memory, and awareness. Cranial nerves 2-12 are intact. Sensory exam is normal. Reflexes are 2+ UE and 3+ LE, extensor/flexor tone bilateral LE's (MAS 1-1+)  R>L. UE motor 5/5. RLE 3+/5 prox to 1-2/5 distally, LLE 3-4/5 prox to  2/5--  Musculoskeletal: No pain with ROM. R>L heel cord tightness still present   Assessment/Plan: 1. Functional deficits which require 3+ hours per day of interdisciplinary therapy in a comprehensive inpatient rehab setting. Physiatrist is providing close team supervision and 24 hour management of active medical  problems listed below. Physiatrist and rehab team continue to assess barriers to discharge/monitor patient progress toward functional and medical goals  Care Tool:  Bathing    Body parts bathed by patient: Face, Abdomen, Chest, Left arm, Right arm, Front perineal area, Buttocks, Right upper leg, Left upper leg, Right lower leg   Body parts bathed by helper: Buttocks (back) Body parts n/a: Left lower leg   Bathing assist Assist Level: Minimal Assistance - Patient > 75%     Upper Body Dressing/Undressing Upper body dressing   What is the patient wearing?: Pull over shirt    Upper body assist Assist Level: Set up assist    Lower Body Dressing/Undressing Lower body dressing      What is the patient wearing?: Pants     Lower body assist Assist for lower body dressing: Minimal Assistance - Patient > 75%     Toileting Toileting    Toileting assist Assist for toileting: Moderate Assistance - Patient 50 - 74%     Transfers Chair/bed transfer  Transfers assist     Chair/bed transfer assist level: Contact Guard/Touching assist Chair/bed transfer assistive device: Programmer, multimedia   Ambulation assist      Assist level: Contact Guard/Touching assist Assistive device: Walker-rolling Max distance: 250 ft  Walk 10 feet activity   Assist  Walk 10 feet activity did not occur: Safety/medical concerns (limited by decreased balance and extensor hypertonicity)  Assist level: Contact Guard/Touching assist Assistive device: Walker-rolling   Walk 50 feet activity   Assist Walk 50 feet with 2 turns activity did not occur: Safety/medical concerns  Assist level: Contact Guard/Touching assist Assistive device: Walker-rolling    Walk 150 feet activity   Assist Walk 150 feet activity did not occur: Safety/medical concerns  Assist level: Contact Guard/Touching assist Assistive device: Walker-rolling    Walk 10 feet on uneven surface   activity   Assist Walk 10 feet on uneven surfaces activity did not occur: Safety/medical concerns         Wheelchair     Assist Will patient use wheelchair at discharge?: Yes Type of Wheelchair: Manual    Wheelchair assist level: Supervision/Verbal cueing Max wheelchair distance: 75    Wheelchair 50 feet with 2 turns activity    Assist        Assist Level: Supervision/Verbal cueing   Wheelchair 150 feet activity     Assist      Assist Level: Supervision/Verbal cueing   Blood pressure (!) 106/59, pulse 72, temperature 98.2 F (36.8 C), temperature source Oral, resp. rate 18, height 5\' 9"  (1.753 m), weight 80.3 kg, SpO2 99 %.  Medical Problem List and Plan: 1.  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  Impaired mobility -DVT/anticoagulation:  Pharmaceutical: Continue Heparin 5,000U q8H             -antiplatelet therapy: 3. Pain Management: N/A 4. Mood: LCSW to follow for evaluation and support.              -antipsychotic agents: N/A 5. Neuropsych: This patient is capable of making decisions on his own behalf. 6. Skin/Wound Care: Remove staples 7/14  7. Fluids/Electrolytes/Nutrition: encourage PO  -solid PO 8. Spasticity:   baclofen   -rom, splinting. Reviewed today with pt   baclofen reduced to 10mg  tid d/t dizziness, hypotension 9. Thrombocytopenia: Monitor for signs of bleeding.  -up to 201k  10 Hyperglycemia: Likely due to steroids. -- Hgb A1C 6.0. 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 Acute blood loss anemia: Monitor for signs of bleeding with drop in plts.             --hgb holding at 11.9 again 7/11   -recheck  monday 13. Chronic R-knee pain: S/P steroid injection  03/22 14. H/o Stroke with residual: 15. GERD?: Has been refusing PPI.  16. Constipation/neurogenic bowel:  -stool burden on kub, a couple liquid stools  -sorbitol   followed by [facility] with good results  Continue Senna 2 tabs HS.   7/15 had results this morning after sorbitol, SSE   -have increased daily regimen adding miralax bid  7/20-21-  regular pattern now 17. Neurogenic bladder:  -pvr's/cath volumes 300-800cc, occ incontinent partial void  -UC negative--   -continue flomax and increased urecholine to 50mg  tid 7/14  -rxing constipation  -toilet or bsc to void, standing if possible  7/21.  Pt learning self caths   -2 I/o caths since yesterday   -  need to keep cath volumes between 300-500cc. Discouraged water "restriction"   -  urecholine 50mg  qid   -outpt urology consult likely    LOS: 15 days A FACE TO [place] 07/11/2021, 10:23 AM

## 2021-07-11 NOTE — Progress Notes (Signed)
Occupational Therapy Session Note  Patient Details  Name: James Olsen MRN: [id number] Date of Birth: 01-Oct-1946  Today's Date: 07/11/2021 OT Individual Time: 0800-0900 OT Individual Time Calculation (min): 60 min    Today's Date: 07/11/2021 OT Individual Time: 1300-1330 OT Individual Time Calculation (min): 30 min  and Today's Date: 07/11/2021 OT Missed Time: 30 Minutes Missed Time Reason: Nursing care;Other (comment)  Today's Date: 07/11/2021 OT Individual Time: 4825-0037 OT Individual Time Calculation (min): 25 min   Short Term Goals: Week 1:  OT Short Term Goal 1 (Week 1): Pt will dress LB using AE as needed with mod assist. OT Short Term Goal 1 - Progress (Week 1): Met OT Short Term Goal 2 (Week 1): Pt will complete toilet transfer with min assist. OT Short Term Goal 2 - Progress (Week 1): Met OT Short Term Goal 3 (Week 1): Pt will complete toileting tasks with mod assist. OT Short Term Goal 3 - Progress (Week 1): Met  Skilled Therapeutic Interventions/Progress Updates:    Session 1: Pt received in bed with 4 out of 10 pain in L calf- more tightness than pain per pt. Stretching and standing provided for pain relief  ADL:  Pt completes toileting with CGA in standing for clothing management and sitting for hygeine Pt completes toileting transfer with CGA overall for sit to stand from EOB and functional mobility with RW to bathroom/BSC without AFO Pt completes footwear with S and increased time to don socks in figure 4 and MOD A to don shoes/AFO in R shoe. OT installs elastic laces into shoes to increase independence with donning footwear. Continent standing urine void with urinal 125 mL!  Therapeutic activity Pt requesting to compelte laundry. Pt gathers laundry items in bag with reacher at supervision position and completes functional mobility with RW to laundry room with CGA-Sup A. Standing loading clothing into shelf above washer as washer is running with Supervision A  for standing tolerance and balance.  Pt left at end of session in w/c with exit alarm on, call light in reach and all needs met  Session 2:  Pt received in bed with urina in place and no pain. Pt very uncomfortable d/t not being able to void bladder laying in bed. Attempted standing void at EOB with urinal, standing over toilet, and even seated & standing in shower with water running to attempt to void. Pt unable. Pt overall CGA for all mobility into and out of bathroom with RW at amb level. RN notified to cath pt as he is very uncomfortable.  Pt left at end of session in bed with exit alarm on, call light in reach and all needs met. Pt missed 30 min skilled OT d/t cathing  Session 3:  Pt received in bed with no pain but discomfort in bladder.  ADL:  Pt completes bathing with UB at EOB with set up Pt completes UB dressing with set up Pt attempted to void bladder in standing with urinal however cannot void.  Therapeutic exercise OT completes BLE heel cord, hamstring, and hip ext/int rotation stretch 1 min each.  Pt left at end of session in bed with exit alarm on, call light in reach and all needs met   Therapy Documentation Precautions:  Precautions Precautions: Fall, Other (comment) Precaution Comments: BLE extension hypertonicity Restrictions Weight Bearing Restrictions: No General:   Vital Signs: Therapy Vitals Temp: 98 F (36.7 C) Temp Source: Oral Pulse Rate: 69 Resp: 18 BP: 113/73 Patient Position (if appropriate): Lying Oxygen  Therapy SpO2: 99 % O2 Device: Room Air Pain:   ADL: ADL Eating: Set up Where Assessed-Eating: Wheelchair Grooming: Setup Where Assessed-Grooming: Sitting at sink Upper Body Dressing: Minimal assistance Where Assessed-Upper Body Dressing: Sitting at sink Lower Body Dressing: Maximal assistance Where Assessed-Lower Body Dressing: Other (Comment), Wheelchair (and standing at [employer]) Toilet Transfer: Dependent (using stedy with mod  assist for sit<>stand) Science writer: Bedside commode Vision   Perception    Praxis   Exercises:   Other Treatments:     Therapy/Group: Individual Therapy  Tonny Branch 07/11/2021, 6:48 AM

## 2021-07-11 NOTE — Progress Notes (Signed)
Physical Therapy Session Note  Patient Details  Name: James Olsen MRN: [id number] Date of Birth: 1946/09/02  Today's Date: 07/11/2021 PT Individual Time: 0950-1045 PT Individual Time Calculation (min): 55 min   Short Term Goals: Week 2:  PT Short Term Goal 1 (Week 2): Patient will perform bed mobilty with supervision without hospital bed features consistently. PT Short Term Goal 2 (Week 2): Patient will perform basic transfers with supervision using LRAD. PT Short Term Goal 3 (Week 2): Patient will ambulate >300 ft during 6 min walk test with CGA using LRAD for safety. PT Short Term Goal 4 (Week 2): Patient will improved his score on the Berg by >5 point to meet the MCID and demonstrate improved standing balance and reduced fall risk.  Skilled Therapeutic Interventions/Progress Updates:     Patient in w/c in the room upon PT arrival. Patient alert and agreeable to PT session. Patient reported 2-3/10 R calf pain/tightness during session, RN made aware. PT provided repositioning, rest breaks, and distraction as pain interventions throughout session.   Applied Kinesiotape over gastroc using inhibitory technique with the aim of increased extensor tone management and pain. Instructed patient that the tape can be left on up to 3 days and can be worn in the shower. Instructed to removed the tape if peeling off, signs of skin irritation arise, or it has been on for >3 days. Informed patient that the tape is best removed in the shower or with a wet wash cloth. Patient stated understanding. Rehab team informed about tape placement to assist with monitoring patient's response.  Patient with sudden onset of numbness/tingling on L side of his body during gait in Lite Gait harness and R arm during BP assessment, sensation intact on assessment. Patient reports that this has occurred in the past at prior to finding each tumor and following surgery to resect his last tumor. Patient may be experiencing  hypersensitivity as both events occurred with compression near or around to affected area. Assessed vitals, patient continues to be orthostatic, see vitals below.  Orthostatic Vitals: Sitting: BP 127/76, HR 77 Standing: BP 102/72, HR 79 Standing x3 min: BP 108/67, HR 88 Patient reports he is drinking 3-4 cups of fluids a day, educated on increasing to at least 8 8oz cups of fluid a day and provided patient with a paper log to keep track of fluid intake.  Therapeutic Activity: Bed Mobility: Patient performed sit to supine with supervision and min use of bed rail in flat bed. Provided verbal cues for returning through side-lying to improved positioning when lying in the bed. Transfers: Patient performed sit to/from stand x4 with supervision with or without RW. Provided verbal cues for scooting forward and forward weight shift x1. Patient maintained standing balance >3 min during vitals assessment with supervision with no upper extremity support progressing to B upper extremity support due to decreased endurance in standing.   Gait Training:  Patient ambulated 15 feet in the Lite Gait with close supervision-CGA, limited by sudden onset of L side numbness/tingling from. Patient ambulated ~85 feet back to his room using RW with CGA. Ambulated with R PLS AFO with reciprocal gait pattern, decreased R hip flexion, knee flexion, and DF limiting R foot clearance in swing, decreased L weight shift, lateral hip instability R>L, and increased adductor tone R>L. Provided min cues for hamstring activation, L weight shift, R hip abduction, and R foot clearance during gait, provided cues in summative feedback after trials for patient to utilize intrinsic and visual  feedback to self-correct during gait training.  Patient in bed at end of session with breaks locked, bed alarm set, and all needs within reach. Set up patient with a fresh cup of water  Therapy Documentation Precautions:  Precautions Precautions: Fall,  Other (comment) Precaution Comments: BLE extension hypertonicity Restrictions Weight Bearing Restrictions: No    Therapy/Group: Individual Therapy  Earvin Blazier L Kyarra Vancamp PT, DPT  07/11/2021, 12:27 PM

## 2021-07-12 MED ORDER — POLYETHYLENE GLYCOL 3350 17 G PO PACK
17.0000 g | PACK | Freq: Every day | ORAL | Status: DC
  Administered 2021-07-15 – 2021-07-18 (×4): 17 g via ORAL
  Filled 2021-07-12 (×6): qty 1

## 2021-07-12 MED ORDER — SENNOSIDES-DOCUSATE SODIUM 8.6-50 MG PO TABS
1.0000 | ORAL_TABLET | Freq: Every day | ORAL | Status: DC
  Administered 2021-07-12 – 2021-07-17 (×6): 1 via ORAL
  Filled 2021-07-12 (×7): qty 1

## 2021-07-12 NOTE — Progress Notes (Signed)
Occupational Therapy Weekly Progress Note  Patient Details  Name: James Olsen MRN: [id number] Date of Birth: [birth year]  Beginning of progress report period: July 05, 2021 End of progress report period: July 12, 2021  Today's Date: 07/12/2021 OT Individual Time: 1000-1059 OT Individual Time Calculation (min): 59 min    Patient has met 3 of 4 short term goals.  Pt is making good progress towards goals requrieing CGA for ADL performance except footwear which pt requires MOD A Overall d/t AFO. Pt and wife have been present for family education session as stated below and wife demo understanding of supervision/guarding techniques for at home and problem solves shower set up. Pt continues to be limited by increased time in RLE and fatigue.  Patient continues to demonstrate the following deficits: muscle weakness, decreased cardiorespiratoy endurance, impaired timing and sequencing, abnormal tone, unbalanced muscle activation, and decreased coordination, and decreased standing balance, decreased postural control, and decreased balance strategies and therefore will continue to benefit from skilled OT intervention to enhance overall performance with BADL, iADL, and Reduce care partner burden.  Patient progressing toward long term goals..  Continue plan of care.  OT Short Term Goals Week 2:  OT Short Term Goal 1 (Week 2): Pt will consistently transfer to toilet with no more than CGA OT Short Term Goal 1 - Progress (Week 2): Met OT Short Term Goal 2 (Week 2): Pt will don footwear with AE PRN and no more than MIN A OT Short Term Goal 2 - Progress (Week 2): Progressing toward goal OT Short Term Goal 3 (Week 2): Pt will complete 3/3 components of toileting wiht CGA for balance only OT Short Term Goal 3 - Progress (Week 2): Met OT Short Term Goal 4 (Week 2): Pt will bathe 10/10 body part with AE PRN and CGA for balance only OT Short Term Goal 4 - Progress (Week 2): Met Week 3:  OT Short Term Goal  1 (Week 3): stg=ltg d/t elos  Skilled Therapeutic Interventions/Progress Updates:     Pt received in bed with no pain during session Family Education  Wife present today for family education. Education provided on mobility techniques, hand placement, AFO donning, hemi strategies, bed mobility, toileting/transfers, energy conservation and shower transfers. Pt and OT demo bed mobility and basic transfers at ambulatory level with RW. Pt and wife ambulate to bathroom/BSC over toilet in ADL apartment with min cuing for wife closer proximity to pt during mobiltiy. Pt and OT problem solve getting into shower and overall consensus is to put [facility] into shower to improve safety with sit to stand as the shower is deep enough for BSC to fit in front of bench. All questions answered and wife reports will have [name] check out shower set up as well if they feel uncomfortable doing it prior.     Pt left at end of session in bed with exit alarm on, call light in reach and all needs met   Therapy Documentation Precautions:  Precautions Precautions: Fall, Other (comment) Precaution Comments: BLE extension hypertonicity Restrictions Weight Bearing Restrictions: No General:   Vital Signs: Therapy Vitals Temp: 97.6 F (36.4 C) Pulse Rate: 60 Resp: 20 BP: 107/68 Patient Position (if appropriate): Lying Oxygen Therapy SpO2: 100 % O2 Device: Room Air Pain:   ADL: ADL Eating: Set up Where Assessed-Eating: Wheelchair Grooming: Setup Where Assessed-Grooming: Sitting at sink Upper Body Dressing: Minimal assistance Where Assessed-Upper Body Dressing: Sitting at sink Lower Body Dressing: Maximal assistance Where Assessed-Lower Body Dressing: Other (  Comment), Wheelchair (and standing at [employer]) Armed forces technical officer: Dependent (using stedy with mod assist for sit<>stand) Science writer: Bedside commode Vision   Perception    Praxis   Exercises:   Other Treatments:     Therapy/Group:  Individual Therapy  James Olsen 07/12/2021, 6:41 AM

## 2021-07-12 NOTE — Discharge Instructions (Addendum)
Inpatient Rehab Discharge Instructions  HAWKEYE PINE Discharge date and time:    Activities/Precautions/ Functional Status: Activity: no lifting, driving, or strenuous exercise till cleared by MD Diet: regular diet Wound Care: keep wound clean and dry.  Contact Dr. Christella Noa if you develop any problems with your incision/wound--redness, swelling, increase in pain, drainage or if you develop fever or chills.     Functional status:  ___ No restrictions     ___ Walk up steps independently _X__ 24/7 supervision/assistance   ___ Walk up steps with assistance ___ Intermittent supervision/assistance  ___ Bathe/dress independently ___ Walk with walker     ___ Bathe/dress with assistance ___ Walk Independently    ___ Shower independently ___ Walk with assistance    ___ Shower with assistance _X__ No alcohol     ___ Return to work/school ________  COMMUNITY REFERRALS UPON DISCHARGE:    Home Health:   PT     OT     RN    SNA                Agency: [facility]  Phone: [phone] *Please expect follow-up within 2-3 days of discharge to schedule your home visit. If you have not received follow-up, be sure to contact the branch directly.*  Medical Equipment/Items Ordered: rolling walker and 3in1 bedside commode                                                 Agency/Supplier: [facility] [employer] [phone]  Medical Equipment/Items Ordered: size 60f catheters                                                 Agency/Supplier:[employer] #[id number]  Special Instructions: Monitor fluid intake and urine output--record so you can keep up with amounts. Cath at least twice a day and document residual amount.  No driving till cleared by Dr. VMickeal Skinner Have to be seizure free for 6 months per [place] law.    My questions have been answered and I understand these instructions. I will adhere to these goals and the provided educational materials after my discharge from the hospital.  Patient/Caregiver  Signature _______________________________ Date __________  Clinician Signature _______________________________________ Date __________  Please bring this form and your medication list with you to all your follow-up doctor's appointments.

## 2021-07-12 NOTE — Progress Notes (Signed)
PROGRESS NOTE   Subjective/Complaints:  Feels that bowels are a little overactive, mushy.  Voiding partially, still needing I/o cath half of the time. Has noticed some tingling in legs a times  ROS: Patient denies fever, rash, sore throat, blurred vision, nausea, vomiting, diarrhea, cough, shortness of breath or chest pain, joint or back pain, headache, or mood change.       Objective:   No results found. No results for input(s): WBC, HGB, HCT, PLT in the last 72 hours.    No results for input(s): NA, K, CL, CO2, GLUCOSE, BUN, CREATININE, CALCIUM in the last 72 hours.     Intake/Output Summary (Last 24 hours) at 07/12/2021 1151 Last data filed at 07/12/2021 0945 Gross per 24 hour  Intake 960 ml  Output 3595 ml  Net -2635 ml        Physical Exam: Vital Signs Blood pressure 107/68, pulse 60, temperature 97.6 F (36.4 C), resp. rate 20, height '5\' 9"'$  (1.753 m), weight 80.3 kg, SpO2 100 %.   Constitutional: No distress . Vital signs reviewed. HEENT: EOMI, oral membranes moist Neck: supple Cardiovascular: RRR without murmur. No JVD    Respiratory/Chest: CTA Bilaterally without wheezes or rales. Normal effort    GI/Abdomen: BS +, non-tender, non-distended Ext: no clubbing, cyanosis, or edema Psych: pleasant and cooperative Skin: scalp wound CDI, staples out Neuro: Pt is cognitively appropriate with normal insight, memory, and awareness. Cranial nerves 2-12 are intact. Sensory exam is normal. Reflexes are 2+ UE and 3+ LE, extensor/flexor tone bilateral LE's (MAS 1-1+)  R>L. UE motor 5/5. RLE 3+/5 prox to 1-2/5 distally, LLE 3-4/5 prox to  2/5--  Musculoskeletal: No pain with ROM. R>L heel cord tightness still present   Assessment/Plan: 1. Functional deficits which require 3+ hours per day of interdisciplinary therapy in a comprehensive inpatient rehab setting. Physiatrist is providing close team supervision and 24  hour management of active medical problems listed below. Physiatrist and rehab team continue to assess barriers to discharge/monitor patient progress toward functional and medical goals  Care Tool:  Bathing    Body parts bathed by patient: Face, Abdomen, Chest, Left arm, Right arm, Front perineal area, Buttocks, Right upper leg, Left upper leg, Right lower leg   Body parts bathed by helper: Buttocks (back) Body parts n/a: Left lower leg   Bathing assist Assist Level: Minimal Assistance - Patient > 75%     Upper Body Dressing/Undressing Upper body dressing   What is the patient wearing?: Pull over shirt    Upper body assist Assist Level: Set up assist    Lower Body Dressing/Undressing Lower body dressing      What is the patient wearing?: Pants     Lower body assist Assist for lower body dressing: Minimal Assistance - Patient > 75%     Toileting Toileting    Toileting assist Assist for toileting: Moderate Assistance - Patient 50 - 74%     Transfers Chair/bed transfer  Transfers assist     Chair/bed transfer assist level: Contact Guard/Touching assist Chair/bed transfer assistive device: Programmer, multimedia   Ambulation assist      Assist level: Contact Guard/Touching assist Assistive  device: Walker-rolling Max distance: 250 ft   Walk 10 feet activity   Assist  Walk 10 feet activity did not occur: Safety/medical concerns (limited by decreased balance and extensor hypertonicity)  Assist level: Contact Guard/Touching assist Assistive device: Walker-rolling   Walk 50 feet activity   Assist Walk 50 feet with 2 turns activity did not occur: Safety/medical concerns  Assist level: Contact Guard/Touching assist Assistive device: Walker-rolling    Walk 150 feet activity   Assist Walk 150 feet activity did not occur: Safety/medical concerns  Assist level: Contact Guard/Touching assist Assistive device: Walker-rolling    Walk 10 feet  on uneven surface  activity   Assist Walk 10 feet on uneven surfaces activity did not occur: Safety/medical concerns         Wheelchair     Assist Will patient use wheelchair at discharge?: Yes Type of Wheelchair: Manual    Wheelchair assist level: Supervision/Verbal cueing Max wheelchair distance: 75    Wheelchair 50 feet with 2 turns activity    Assist        Assist Level: Supervision/Verbal cueing   Wheelchair 150 feet activity     Assist      Assist Level: Supervision/Verbal cueing   Blood pressure 107/68, pulse 60, temperature 97.6 F (36.4 C), resp. rate 20, height '5\' 9"'$  (1.753 m), weight 80.3 kg, SpO2 100 %.  Medical Problem List and Plan: 1.  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  Impaired mobility -DVT/anticoagulation:  Pharmaceutical: Continue Heparin 5,000U q8H             -antiplatelet therapy: 3. Pain Management: N/A 4. Mood: LCSW to follow for evaluation and support.              -antipsychotic agents: N/A 5. Neuropsych: This patient is capable of making decisions on his own behalf. 6. Skin/Wound Care: Remove staples 7/14  7. Fluids/Electrolytes/Nutrition: encourage PO  -solid PO 8. Spasticity:   baclofen   -rom, splinting. Reviewed today with pt   baclofen reduced to '10mg'$  tid d/t dizziness, hypotension 9. Thrombocytopenia: Monitor for signs of bleeding.  -up to 201k ---recheck monday 10 Hyperglycemia: Likely due to steroids. -- Hgb A1C 6.0. 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 Acute blood loss anemia: Monitor for signs of bleeding with drop in plts.             --hgb holding at 12.9    -recheck  monday 13. Chronic R-knee pain: S/P steroid injection  03/22 14. H/o Stroke with residual: 15. GERD?: Has been refusing PPI.  16. Constipation/neurogenic bowel:  -stool burden on kub, a couple liquid stools  -sorbitol   followed by SSE with good results  Continue Senna 2 tabs HS.   7/15 had results this morning after sorbitol, SSE   -have increased daily regimen adding miralax bid  7/22- reduce miralax to QD 17. Neurogenic bladder:  -pvr's/cath volumes 300-800cc, occ incontinent partial void  -UC negative--   -continue flomax and increased urecholine to '50mg'$  tid 7/14  -rxing constipation  -toilet or bsc to void, standing if possible  7/22.  Pt has learned self caths   -  continue caths prn   -need to keep cath volumes between 300-500cc. Discouraged water "restriction"   -  urecholine '50mg'$  qid   -outpt urology consult likely    LOS: 16 days A FACE TO [place] 07/12/2021, 11:51 AM

## 2021-07-12 NOTE — Progress Notes (Signed)
Occupational Therapy Session Note  Patient Details  Name: James Olsen MRN: [id number] Date of Birth: Jun 19, 1946  Today's Date: 07/12/2021 OT Individual Time: 7106-2694 OT Individual Time Calculation (min): 30 min    Short Term Goals: Week 2:  OT Short Term Goal 1 (Week 2): Pt will consistently transfer to toilet with no more than CGA OT Short Term Goal 2 (Week 2): Pt will don footwear with AE PRN and no more than MIN A OT Short Term Goal 3 (Week 2): Pt will complete 3/3 components of toileting wiht CGA for balance only OT Short Term Goal 4 (Week 2): Pt will bathe 10/10 body part with AE PRN and CGA for balance only  Skilled Therapeutic Interventions/Progress Updates:    Pt received supine with no c/o pain, agreeable to session but reporting he feels "out of it". He completed bed mobility to EOB and BP was assessed- WNL. Pt required increased time to don shoes with AFO, first trying figure 4 method and then leaning forward seated. Mod A required to don R shoe and AFO d/t AFO. Pt completed sit > stand with min A and then min A for 125 ft of functional mobility. Decreased R foot clearance and BOS, cued pt for both, he reported fatigue was the cause. Pt returned to his room and was left sitting up in the w/c with all needs met.   Therapy Documentation Precautions:  Precautions Precautions: Fall, Other (comment) Precaution Comments: BLE extension hypertonicity Restrictions Weight Bearing Restrictions: No  Therapy/Group: Individual Therapy  [name] 07/12/2021, 6:36 AM

## 2021-07-12 NOTE — Progress Notes (Signed)
Physical Therapy Session Note  Patient Details  Name: James Olsen MRN: [id number] Date of Birth: 08/23/1946  Today's Date: 07/12/2021 PT Individual Time: 1100-1155 and 1530-1405 PT Individual Time Calculation (min): 55 min and 35 min  Short Term Goals: Week 1:  PT Short Term Goal 1 (Week 1): Patient will perform bed mobility with min A. PT Short Term Goal 1 - Progress (Week 1): Met PT Short Term Goal 2 (Week 1): Patient will perform basic transfers with min A using LRAD. PT Short Term Goal 2 - Progress (Week 1): Met PT Short Term Goal 3 (Week 1): Patient will ambulate >100 ft using LRAD PT Short Term Goal 3 - Progress (Week 1): Met PT Short Term Goal 4 (Week 1): Patient will perform standing balance >5 min with CGA using LRAD. PT Short Term Goal 5 - Progress (Week 1): Met Week 2:  PT Short Term Goal 1 (Week 2): Patient will perform bed mobilty with supervision without hospital bed features consistently. PT Short Term Goal 2 (Week 2): Patient will perform basic transfers with supervision using LRAD. PT Short Term Goal 3 (Week 2): Patient will ambulate >300 ft during 6 min walk test with CGA using LRAD for safety. PT Short Term Goal 4 (Week 2): Patient will improved his score on the Berg by >5 point to meet the MCID and demonstrate improved standing balance and reduced fall risk. Week 3:     Skilled Therapeutic Interventions/Progress Updates:    AM SESSION Pain:  Pt reports no pain.  Treatment to tolerance.  Rest breaks and repositioning as needed.  Pt initially oob in wc w/wife at bedside and agreeable to treatment session w/focus on Family Training w/wife.  Major points addressed in session included: *Safe guarding technique w/Sit to stand, stand pivot transfer, car transfers, gait, and stairs. *Posterior bias that can occur w/transfers/functional mobility *Balance deficits and need for use of RW at all times w/goal of return to no AD in future w/continued PT intervention   *Possible need for AFO, plan to reassess 7/25 w/primary. *Stretching for tone management *Use of Step-To Gait pattern on stairs for safety.  Pt performed gait, car transfer, and stair training w/therapist demonstrating guarding then repeated each w/wife w/therapist acting as patient and again w/wife guarding patient w/good technique demonstrated w/each activity.  Discussed impaired clearance w/swing phase of gait/safety implications,  removing throw rugs and futher difficulty navigating on any rugs in home.    Pt performed all functional mobility w/CGA and wife able to provide level of care during session. AFO may be useful for decreasing caregiver burden considering extenuating circumstances.  It did make stair negotiation safer and assist pt w/managing his own limb into and out of car during this session.  However, pt continues to demonstrate improvements in all areas so continued monitoring is prudent per POC for this decision. At end of session, pt transported to room.  Pt left oob in wc w/alarm belt set and needs in reach   PM SESSION: Pt initially supine, agreeable to session w/focus on stretching and gait mechanics.  Therapist assisted pt in stretching hamstrings, hip adducters, hip Irs, gastrocs, soleus, post tib, gluts. Supine to sit w/bed rail and supervision. Therapist donned shoes and L AFO for time management.  Sit to stand w/supervision to RW.  Gait 276f w/RW, cues for upright gaze and posture, pt w/several sequences of short distance gait achieving clearance + increased cadence, at other times fails to clear but self corrects to complete step  safely.  Focused on use on hip and knee flexion to improve clearance as well as upright posture.   stand pivot transfer to bed w/RW, cga.  Therapist removes shoes/afo.  Sit to supine w/supervision.  Scoots in bed using bedrails.  Pt left supine w/rails up x 4,  alarm set, bed in lowest position, and needs in reach.   Therapy  Documentation Precautions:  Precautions Precautions: Fall, Other (comment) Precaution Comments: BLE extension hypertonicity Restrictions Weight Bearing Restrictions: No   Therapy/Group: Individual Therapy Callie Fielding, [place] 07/12/2021, 12:35 PM

## 2021-07-12 NOTE — Plan of Care (Signed)
  Problem: Consults Goal: RH BRAIN INJURY PATIENT EDUCATION Description: Description: See Patient Education module for eduction specifics Outcome: Progressing Goal: Skin Care Protocol Initiated - if Braden Score 18 or less Description: If consults are not indicated, leave blank or document N/A Outcome: Progressing   Problem: RH BOWEL ELIMINATION Goal: RH STG MANAGE BOWEL WITH ASSISTANCE Description: STG Manage Bowel with min Assistance. Outcome: Progressing   Problem: RH SKIN INTEGRITY Goal: RH STG ABLE TO PERFORM INCISION/WOUND CARE W/ASSISTANCE Description: STG Able To Perform Incision/Wound Care With min Assistance. Outcome: Progressing   Problem: RH SAFETY Goal: RH STG ADHERE TO SAFETY PRECAUTIONS W/ASSISTANCE/DEVICE Description: STG Adhere to Safety Precautions With min Assistance/Device. Outcome: Progressing   Problem: RH COGNITION-NURSING Goal: RH STG USES MEMORY AIDS/STRATEGIES W/ASSIST TO PROBLEM SOLVE Description: STG Uses Memory Aids/Strategies With cues and reminders to Problem Solve. Outcome: Progressing   Problem: RH PAIN MANAGEMENT Goal: RH STG PAIN MANAGED AT OR BELOW PT'S PAIN GOAL Description: < 3 on a 0-10 pain scale. Outcome: Progressing   Problem: RH KNOWLEDGE DEFICIT BRAIN INJURY Goal: RH STG INCREASE KNOWLEDGE OF SELF CARE AFTER BRAIN INJURY Description: Patient will be able to demonstrate knowledge of medication management, pain management, bowel management, skin/wound care with educational materials and handouts provided by staff independently at discharge. Outcome: Progressing

## 2021-07-13 NOTE — Plan of Care (Signed)
  Problem: Consults Goal: RH BRAIN INJURY PATIENT EDUCATION Description: Description: See Patient Education module for eduction specifics Outcome: Progressing Goal: Skin Care Protocol Initiated - if Braden Score 18 or less Description: If consults are not indicated, leave blank or document N/A Outcome: Progressing   Problem: RH BOWEL ELIMINATION Goal: RH STG MANAGE BOWEL WITH ASSISTANCE Description: STG Manage Bowel with min Assistance. Outcome: Progressing   Problem: RH SKIN INTEGRITY Goal: RH STG ABLE TO PERFORM INCISION/WOUND CARE W/ASSISTANCE Description: STG Able To Perform Incision/Wound Care With min Assistance. Outcome: Progressing   Problem: RH SAFETY Goal: RH STG ADHERE TO SAFETY PRECAUTIONS W/ASSISTANCE/DEVICE Description: STG Adhere to Safety Precautions With min Assistance/Device. Outcome: Progressing   Problem: RH COGNITION-NURSING Goal: RH STG USES MEMORY AIDS/STRATEGIES W/ASSIST TO PROBLEM SOLVE Description: STG Uses Memory Aids/Strategies With cues and reminders to Problem Solve. Outcome: Progressing   Problem: RH PAIN MANAGEMENT Goal: RH STG PAIN MANAGED AT OR BELOW PT'S PAIN GOAL Description: < 3 on a 0-10 pain scale. Outcome: Progressing   Problem: RH KNOWLEDGE DEFICIT BRAIN INJURY Goal: RH STG INCREASE KNOWLEDGE OF SELF CARE AFTER BRAIN INJURY Description: Patient will be able to demonstrate knowledge of medication management, pain management, bowel management, skin/wound care with educational materials and handouts provided by staff independently at discharge. Outcome: Progressing

## 2021-07-13 NOTE — Progress Notes (Signed)
PROGRESS NOTE   Subjective/Complaints:  No issues overnight.  Patient had lunch.  ROS: Patient denies nausea, vomiting, diarrhea, constipation    Objective:   No results found. No results for input(s): WBC, HGB, HCT, PLT in the last 72 hours.    No results for input(s): NA, K, CL, CO2, GLUCOSE, BUN, CREATININE, CALCIUM in the last 72 hours.     Intake/Output Summary (Last 24 hours) at 07/13/2021 1255 Last data filed at 07/13/2021 1244 Gross per 24 hour  Intake 476 ml  Output 1900 ml  Net -1424 ml         Physical Exam: Vital Signs Blood pressure 101/67, pulse (!) 57, temperature 97.7 F (36.5 C), temperature source Oral, resp. rate 20, height '5\' 9"'$  (1.753 m), weight 80.3 kg, SpO2 99 %.  General: No acute distress Mood and affect are appropriate Heart: Regular rate and rhythm no rubs murmurs or extra sounds Lungs: Clear to auscultation, breathing unlabored, no rales or wheezes Abdomen: Positive bowel sounds, soft nontender to palpation, nondistended Extremities: No clubbing, cyanosis, or edema  Musculoskeletal: Full range of motion in all 4 extremities. No joint swelling  Neuro: Pt is cognitively appropriate with normal insight, memory, and awareness. Cranial nerves 2-12 are intact. Sensory exam is normal. Reflexes are 2+ UE and 3+ LE, extensor/flexor tone bilateral LE's (MAS 1-1+)  R>L. UE motor 5/5. RLE 3+/5 prox to 1-2/5 distally, LLE 3-4/5 prox to  2/5--unchanged 7/23 Musculoskeletal: No pain with ROM. R>L heel cord tightness still present   Assessment/Plan: 1. Functional deficits which require 3+ hours per day of interdisciplinary therapy in a comprehensive inpatient rehab setting. Physiatrist is providing close team supervision and 24 hour management of active medical problems listed below. Physiatrist and rehab team continue to assess barriers to discharge/monitor patient progress toward functional and  medical goals  Care Tool:  Bathing    Body parts bathed by patient: Face, Abdomen, Chest, Left arm, Right arm, Front perineal area, Buttocks, Right upper leg, Left upper leg, Right lower leg   Body parts bathed by helper: Buttocks (back) Body parts n/a: Left lower leg   Bathing assist Assist Level: Minimal Assistance - Patient > 75%     Upper Body Dressing/Undressing Upper body dressing   What is the patient wearing?: Pull over shirt    Upper body assist Assist Level: Set up assist    Lower Body Dressing/Undressing Lower body dressing      What is the patient wearing?: Pants     Lower body assist Assist for lower body dressing: Minimal Assistance - Patient > 75%     Toileting Toileting    Toileting assist Assist for toileting: Moderate Assistance - Patient 50 - 74%     Transfers Chair/bed transfer  Transfers assist     Chair/bed transfer assist level: (P) Contact Guard/Touching assist Chair/bed transfer assistive device: (P) Walker   Locomotion Ambulation   Ambulation assist      Assist level: Contact Guard/Touching assist Assistive device: Walker-rolling Max distance: 205   Walk 10 feet activity   Assist  Walk 10 feet activity did not occur: Safety/medical concerns (limited by decreased balance and extensor hypertonicity)  Assist level:  Contact Guard/Touching assist Assistive device: Walker-rolling   Walk 50 feet activity   Assist Walk 50 feet with 2 turns activity did not occur: Safety/medical concerns  Assist level: Contact Guard/Touching assist Assistive device: Walker-rolling    Walk 150 feet activity   Assist Walk 150 feet activity did not occur: Safety/medical concerns  Assist level: Contact Guard/Touching assist Assistive device: Walker-rolling    Walk 10 feet on uneven surface  activity   Assist Walk 10 feet on uneven surfaces activity did not occur: Safety/medical concerns         Wheelchair     Assist Will  patient use wheelchair at discharge?: Yes Type of Wheelchair: Manual    Wheelchair assist level: Supervision/Verbal cueing Max wheelchair distance: 75    Wheelchair 50 feet with 2 turns activity    Assist        Assist Level: Supervision/Verbal cueing   Wheelchair 150 feet activity     Assist      Assist Level: Supervision/Verbal cueing   Blood pressure 101/67, pulse (!) 57, temperature 97.7 F (36.5 C), temperature source Oral, resp. rate 20, height '5\' 9"'$  (1.753 m), weight 80.3 kg, SpO2 99 %.  Medical Problem List and Plan: 1.  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  Impaired mobility -DVT/anticoagulation:  Pharmaceutical: Continue Heparin 5,000U q8H             -antiplatelet therapy: 3. Pain Management: N/A 4. Mood: LCSW to follow for evaluation and support.              -antipsychotic agents: N/A 5. Neuropsych: This patient is capable of making decisions on his own behalf. 6. Skin/Wound Care: Remove staples 7/14  7. Fluids/Electrolytes/Nutrition: encourage PO  -solid PO 8. Spasticity:   baclofen   -rom, splinting. Reviewed today with pt   baclofen reduced to '10mg'$  tid d/t dizziness, hypotension 9. Thrombocytopenia: Monitor for signs of bleeding.  -up to 201k ---recheck monday 10 Hyperglycemia: Likely due to steroids. -- Hgb A1C 6.0. 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 Acute blood loss anemia: Monitor for signs of bleeding with drop in plts.             --hgb holding at 12.9    -recheck  monday 13. Chronic R-knee pain: S/P steroid injection 03/22 14. H/o Stroke with residual: 15. GERD?: Has been refusing PPI.  16. Constipation/neurogenic bowel:  -stool burden on kub, a couple  liquid stools  -sorbitol   followed by SSE with good results  Continue Senna 2 tabs HS.   7/15 had results this morning after sorbitol, SSE   -have increased daily regimen adding miralax bid  7/22- reduce miralax to QD 17. Neurogenic bladder:  -pvr's/cath volumes 300-800cc, occ incontinent partial void  -UC negative--   -continue flomax and increased urecholine to '50mg'$  tid 7/14  -rxing constipation  -toilet or bsc to void, standing if possible  7/22.  Pt has learned self caths   -continue caths prn   -need to keep cath volumes between 300-500cc. Discouraged water "restriction"   -  urecholine '50mg'$  qid   -outpt urology consult likely  LOS: 17 days A FACE TO FACE EVALUATION WAS PERFORMED  Charlett Blake 07/13/2021, 12:55 PM

## 2021-07-13 NOTE — Progress Notes (Signed)
Pt scanned for 481 ml urine pt performed self cath with minimal cueing

## 2021-07-14 MED ORDER — DEXAMETHASONE 0.5 MG PO TABS
0.5000 mg | ORAL_TABLET | Freq: Two times a day (BID) | ORAL | Status: DC
  Administered 2021-07-14 – 2021-07-16 (×4): 0.5 mg via ORAL
  Filled 2021-07-14 (×5): qty 1

## 2021-07-14 NOTE — Plan of Care (Signed)
  Problem: Consults Goal: RH BRAIN INJURY PATIENT EDUCATION Description: Description: See Patient Education module for eduction specifics Outcome: Progressing Goal: Skin Care Protocol Initiated - if Braden Score 18 or less Description: If consults are not indicated, leave blank or document N/A Outcome: Progressing   Problem: RH BOWEL ELIMINATION Goal: RH STG MANAGE BOWEL WITH ASSISTANCE Description: STG Manage Bowel with min Assistance. Outcome: Progressing   Problem: RH SKIN INTEGRITY Goal: RH STG ABLE TO PERFORM INCISION/WOUND CARE W/ASSISTANCE Description: STG Able To Perform Incision/Wound Care With min Assistance. Outcome: Progressing   Problem: RH SAFETY Goal: RH STG ADHERE TO SAFETY PRECAUTIONS W/ASSISTANCE/DEVICE Description: STG Adhere to Safety Precautions With min Assistance/Device. Outcome: Progressing   Problem: RH COGNITION-NURSING Goal: RH STG USES MEMORY AIDS/STRATEGIES W/ASSIST TO PROBLEM SOLVE Description: STG Uses Memory Aids/Strategies With cues and reminders to Problem Solve. Outcome: Progressing   Problem: RH PAIN MANAGEMENT Goal: RH STG PAIN MANAGED AT OR BELOW PT'S PAIN GOAL Description: < 3 on a 0-10 pain scale. Outcome: Progressing   Problem: RH KNOWLEDGE DEFICIT BRAIN INJURY Goal: RH STG INCREASE KNOWLEDGE OF SELF CARE AFTER BRAIN INJURY Description: Patient will be able to demonstrate knowledge of medication management, pain management, bowel management, skin/wound care with educational materials and handouts provided by staff independently at discharge. Outcome: Progressing

## 2021-07-14 NOTE — Progress Notes (Signed)
PROGRESS NOTE   Subjective/Complaints:  No issues overnight, cath frequencies have gone down from 4-day to 2 a day, patient is pleased about that.  He is trying to stand to void more often  ROS: Patient denies nausea, vomiting, diarrhea, constipation    Objective:   No results found. No results for input(s): WBC, HGB, HCT, PLT in the last 72 hours.    No results for input(s): NA, K, CL, CO2, GLUCOSE, BUN, CREATININE, CALCIUM in the last 72 hours.     Intake/Output Summary (Last 24 hours) at 07/14/2021 1057 Last data filed at 07/14/2021 1036 Gross per 24 hour  Intake 657 ml  Output 1625 ml  Net -968 ml         Physical Exam: Vital Signs Blood pressure 106/66, pulse (!) 58, temperature 97.8 F (36.6 C), temperature source Oral, resp. rate 18, height '5\' 9"'$  (1.753 m), weight 80.3 kg, SpO2 100 %.    General: No acute distress Mood and affect are appropriate Heart: Regular rate and rhythm no rubs murmurs or extra sounds Lungs: Clear to auscultation, breathing unlabored, no rales or wheezes Abdomen: Positive bowel sounds, soft nontender to palpation, nondistended Extremities: No clubbing, cyanosis, or edema Skin: No evidence of breakdown, no evidence of rash  Musculoskeletal: Full range of motion in all 4 extremities. No joint swelling    Neuro: Pt is cognitively appropriate with normal insight, memory, and awareness. Cranial nerves 2-12 are intact. Sensory exam is normal.extensor/flexor tone bilateral LE's (MAS 1-1+)  BUE motor 5/5. RLE 3+/5 prox to 1-2/5 distally, LLE 3-4/5 prox to  2/5--unchanged 7/24 Musculoskeletal: No pain with ROM. R>L heel cord tightness still present   Assessment/Plan: 1. Functional deficits which require 3+ hours per day of interdisciplinary therapy in a comprehensive inpatient rehab setting. Physiatrist is providing close team supervision and 24 hour management of active medical  problems listed below. Physiatrist and rehab team continue to assess barriers to discharge/monitor patient progress toward functional and medical goals  Care Tool:  Bathing    Body parts bathed by patient: Face, Abdomen, Chest, Left arm, Right arm, Front perineal area, Buttocks, Right upper leg, Left upper leg, Right lower leg   Body parts bathed by helper: Buttocks (back) Body parts n/a: Left lower leg   Bathing assist Assist Level: Minimal Assistance - Patient > 75%     Upper Body Dressing/Undressing Upper body dressing   What is the patient wearing?: Pull over shirt    Upper body assist Assist Level: Set up assist    Lower Body Dressing/Undressing Lower body dressing      What is the patient wearing?: Pants     Lower body assist Assist for lower body dressing: Minimal Assistance - Patient > 75%     Toileting Toileting    Toileting assist Assist for toileting: Moderate Assistance - Patient 50 - 74%     Transfers Chair/bed transfer  Transfers assist     Chair/bed transfer assist level: (P) Contact Guard/Touching assist Chair/bed transfer assistive device: (P) Walker   Locomotion Ambulation   Ambulation assist      Assist level: Contact Guard/Touching assist Assistive device: Walker-rolling Max distance: 205   Walk  10 feet activity   Assist  Walk 10 feet activity did not occur: Safety/medical concerns (limited by decreased balance and extensor hypertonicity)  Assist level: Contact Guard/Touching assist Assistive device: Walker-rolling   Walk 50 feet activity   Assist Walk 50 feet with 2 turns activity did not occur: Safety/medical concerns  Assist level: Contact Guard/Touching assist Assistive device: Walker-rolling    Walk 150 feet activity   Assist Walk 150 feet activity did not occur: Safety/medical concerns  Assist level: Contact Guard/Touching assist Assistive device: Walker-rolling    Walk 10 feet on uneven surface   activity   Assist Walk 10 feet on uneven surfaces activity did not occur: Safety/medical concerns         Wheelchair     Assist Will patient use wheelchair at discharge?: Yes Type of Wheelchair: Manual    Wheelchair assist level: Supervision/Verbal cueing Max wheelchair distance: 75    Wheelchair 50 feet with 2 turns activity    Assist        Assist Level: Supervision/Verbal cueing   Wheelchair 150 feet activity     Assist      Assist Level: Supervision/Verbal cueing   Blood pressure 106/66, pulse (!) 58, temperature 97.8 F (36.6 C), temperature source Oral, resp. rate 18, height '5\' 9"'$  (1.753 m), weight 80.3 kg, SpO2 100 %.  Medical Problem List and Plan: 1.  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  Impaired mobility -DVT/anticoagulation:  Pharmaceutical: Continue Heparin 5,000U q8H             -antiplatelet therapy: 3. Pain Management: N/A 4. Mood: LCSW to follow for evaluation and support.              -antipsychotic agents: N/A 5. Neuropsych: This patient is capable of making decisions on his own behalf. 6. Skin/Wound Care: Remove staples 7/14  7. Fluids/Electrolytes/Nutrition: encourage PO  -solid PO 8. Spasticity:   baclofen   -rom, splinting. Reviewed today with pt   baclofen reduced to '10mg'$  tid d/t dizziness, hypotension 9. Thrombocytopenia: Monitor for signs of bleeding.  -up to 201k ---recheck monday 10 Hyperglycemia: Likely due to steroids. -- Hgb A1C 6.0. 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 Acute blood loss anemia: Monitor for signs of bleeding with drop in plts.             --hgb holding at 12.9    -recheck   monday 13. Chronic R-knee pain: S/P steroid injection 03/22 14. H/o Stroke with residual: 15. GERD?: Has been refusing PPI.  16. Constipation/neurogenic bowel:  -stool burden on kub, a couple liquid stools  -sorbitol   followed by SSE with good results  Continue Senna 2 tabs HS.   7/15 had results this morning after sorbitol, SSE   -have increased daily regimen adding miralax bid  7/22- reduce miralax to QD 17. Neurogenic bladder:  -pvr's/cath volumes 300-800cc, occ incontinent partial void  -UC negative--   -continue flomax and increased urecholine to '50mg'$  tid 7/14  -rxing constipation  -toilet or bsc to void, standing if possible  7/22.  Pt  has learned self caths   -continue caths prn   -need to keep cath volumes between 300-500cc. Discouraged water "restriction"   -  urecholine '50mg'$  qid   -outpt urology consult likely Patient has required intermittent catheterization twice a day for the last 2 days prior to that time it was 4 times per day Vitals:   07/13/21 1929 07/14/21 0258  BP: 106/68 106/66  Pulse: 66 (!) 58  Resp: 18 18  Temp: 98.2 F (36.8 C) 97.8 F (36.6 C)  SpO2: 99% 100%   Blood pressures on the low side would not increase Flomax LOS: 18 days A FACE TO FACE EVALUATION WAS PERFORMED  Charlett Blake 07/14/2021, 10:57 AM

## 2021-07-14 NOTE — Progress Notes (Signed)
Physical Therapy Weekly Progress Note  Patient Details  Name: ZARED KNOTH MRN: [id number] Date of Birth: 29-Dec-1945  Beginning of progress report period: July 05, 2021 End of progress report period: July 14, 2021  Today's Date: 07/14/2021 PT Individual Time: 1000-1100 PT Individual Time Calculation (min): 60 min   Patient has met 4 of 4 short term goals. He has made great progress this week progressing to supervision with bed mobility and transfers and CGA-supervision with gait with RW. Focused on d/c planning, gait on unlevel surfaces, orthotic fitting, and stair training this week prior to d/c.  Patient continues to demonstrate the following deficits muscle weakness, decreased cardiorespiratoy endurance, abnormal tone, unbalanced muscle activation, decreased coordination, and decreased motor planning, and decreased standing balance, decreased postural control, hemiplegia, and decreased balance strategies and therefore will continue to benefit from skilled PT intervention to increase functional independence with mobility.  Patient progressing toward long term goals..  Continue plan of care.  PT Short Term Goals Week 2:  PT Short Term Goal 1 (Week 2): Patient will perform bed mobilty with supervision without hospital bed features consistently. PT Short Term Goal 1 - Progress (Week 2): Met PT Short Term Goal 2 (Week 2): Patient will perform basic transfers with supervision using LRAD. PT Short Term Goal 2 - Progress (Week 2): Met PT Short Term Goal 3 (Week 2): Patient will ambulate >300 ft during 6 min walk test with CGA using LRAD for safety. PT Short Term Goal 3 - Progress (Week 2): Met PT Short Term Goal 4 (Week 2): Patient will improved his score on the Berg by >5 point to meet the MCID and demonstrate improved standing balance and reduced fall risk. PT Short Term Goal 4 - Progress (Week 2): Met Week 3:  PT Short Term Goal 1 (Week 3): STG=LTG due to ELOS.  Skilled Therapeutic  Interventions/Progress Updates:     Patient in w/c in the room upon PT arrival. Patient alert and agreeable to PT session. Patient denied pain during session, reports R leg stiffness due to day of rest yesterday without therapies.   Therapeutic Activity: Bed Mobility: Patient performed sit to supine supervision. Provided verbal cues for correcting positioning in bed x1. Transfers: Patient performed sit to/from stand x6 with supervision-mod I with and without RW. Provided verbal cues for forward weight shift and shifting weight into his toes to reduce posterior bias x1.  Gait Training:  6 Min Walk Test:  Instructed patient to ambulate as quickly and as safely as possible for 6 minutes using LRAD. Patient was allowed to take standing rest breaks without stopping the test, but if the patient required a sitting rest break the clock would be stopped and the test would be over.  Results: 459 feet (140.0 meters, Avg speed 0.39 m/s) using a RW RPE 6/10 at end of test. Results indicate that the patient has reduced endurance with ambulation compared to age matched norms (527 meters). Patient ambulated >100 feet on unlevel brick side-walk, asphalt, and concrete side-walk to simulate community and outdoor surfaces, as patient's driveway is very unlevel. Ambulated using RW with CGA, provided cues for lifting RW over changes in level or broken areas of surface. Patient demonstrated slight decrease in gait speed without LOB during activity.    Patient was transported in the w/c with total A to/from [place] entrance for energy conservation and time management.  Neuromuscular Re-ed: Patient performed the [facility] Test, see details below: Patient demonstrates increased fall risk as noted by  score of 27/56 on Berg Balance Scale.  (<36= high risk for falls, close to 100%; 37-45 significant >80%; 46-51 moderate >50%; 52-55 lower >25%) Improved score from 19/56 when assessed last week, meeting the MCID this week.   Patient required increased time to complete test due to increased focus, determination to attempt all tasks or perform them with min A despite testing scores.  Patient in bed due to fatigue at end of session with breaks locked, bed alarm set, and all needs within reach.   Therapy Documentation Precautions:  Precautions Precautions: Fall, Other (comment) Precaution Comments: BLE extension hypertonicity Restrictions Weight Bearing Restrictions: No Balance: Standardized Balance Assessment Standardized Balance Assessment: Berg Balance Test Berg Balance Test Sit to Stand: Able to stand  independently using hands Standing Unsupported: Able to stand 2 minutes with supervision Sitting with Back Unsupported but Feet Supported on Floor or Stool: Able to sit safely and securely 2 minutes Stand to Sit: Controls descent by using hands Transfers: Needs one person to assist Standing Unsupported with Eyes Closed: Able to stand 10 seconds safely Standing Ubsupported with Feet Together: Needs help to attain position but able to stand for 30 seconds with feet together From Standing, Reach Forward with Outstretched Arm: Can reach forward >5 cm safely (2") From Standing Position, Pick up Object from Floor: Unable to pick up shoe, but reaches 2-5 cm (1-2") from shoe and balances independently From Standing Position, Turn to Look Behind Over each Shoulder: Turn sideways only but maintains balance Turn 360 Degrees: Needs assistance while turning Standing Unsupported, Alternately Place Feet on Step/Stool: Needs assistance to keep from falling or unable to try Standing Unsupported, One Foot in Front: Able to take small step independently and hold 30 seconds Standing on One Leg: Unable to try or needs assist to prevent fall Total Score: 27   Therapy/Group: Individual Therapy  Avea Mcgowen L Kadey Mihalic PT, DPT  07/14/2021, 6:20 AM

## 2021-07-15 LAB — CBC
HCT: 40.2 % (ref 39.0–52.0)
Hemoglobin: 13 g/dL (ref 13.0–17.0)
MCH: 30 pg (ref 26.0–34.0)
MCHC: 32.3 g/dL (ref 30.0–36.0)
MCV: 92.6 fL (ref 80.0–100.0)
Platelets: 217 10*3/uL (ref 150–400)
RBC: 4.34 MIL/uL (ref 4.22–5.81)
RDW: 13.5 % (ref 11.5–15.5)
WBC: 5.4 10*3/uL (ref 4.0–10.5)
nRBC: 0 % (ref 0.0–0.2)

## 2021-07-15 LAB — BASIC METABOLIC PANEL
Anion gap: 8 (ref 5–15)
BUN: 18 mg/dL (ref 8–23)
CO2: 27 mmol/L (ref 22–32)
Calcium: 9 mg/dL (ref 8.9–10.3)
Chloride: 97 mmol/L — ABNORMAL LOW (ref 98–111)
Creatinine, Ser: 0.85 mg/dL (ref 0.61–1.24)
GFR, Estimated: >60 mL/min (ref >60)
Glucose, Bld: 118 mg/dL — ABNORMAL HIGH (ref 70–99)
Potassium: 3.7 mmol/L (ref 3.5–5.1)
Sodium: 132 mmol/L — ABNORMAL LOW (ref 135–145)

## 2021-07-15 NOTE — Plan of Care (Signed)
  Problem: Consults Goal: RH BRAIN INJURY PATIENT EDUCATION Description: Description: See Patient Education module for eduction specifics Outcome: Progressing Goal: Skin Care Protocol Initiated - if Braden Score 18 or less Description: If consults are not indicated, leave blank or document N/A Outcome: Progressing   Problem: RH BOWEL ELIMINATION Goal: RH STG MANAGE BOWEL WITH ASSISTANCE Description: STG Manage Bowel with min Assistance. Outcome: Progressing   Problem: RH SKIN INTEGRITY Goal: RH STG ABLE TO PERFORM INCISION/WOUND CARE W/ASSISTANCE Description: STG Able To Perform Incision/Wound Care With min Assistance. Outcome: Progressing   Problem: RH SAFETY Goal: RH STG ADHERE TO SAFETY PRECAUTIONS W/ASSISTANCE/DEVICE Description: STG Adhere to Safety Precautions With min Assistance/Device. Outcome: Progressing   Problem: RH COGNITION-NURSING Goal: RH STG USES MEMORY AIDS/STRATEGIES W/ASSIST TO PROBLEM SOLVE Description: STG Uses Memory Aids/Strategies With cues and reminders to Problem Solve. Outcome: Progressing   Problem: RH PAIN MANAGEMENT Goal: RH STG PAIN MANAGED AT OR BELOW PT'S PAIN GOAL Description: < 3 on a 0-10 pain scale. Outcome: Progressing   Problem: RH KNOWLEDGE DEFICIT BRAIN INJURY Goal: RH STG INCREASE KNOWLEDGE OF SELF CARE AFTER BRAIN INJURY Description: Patient will be able to demonstrate knowledge of medication management, pain management, bowel management, skin/wound care with educational materials and handouts provided by staff independently at discharge. Outcome: Progressing

## 2021-07-15 NOTE — Progress Notes (Signed)
PROGRESS NOTE   Subjective/Complaints:  Continues to void more on his own. I/O cathed only once in last 24 hours including this morning.   ROS: Patient denies fever, rash, sore throat, blurred vision, nausea, vomiting, diarrhea, cough, shortness of breath or chest pain, joint or back pain, headache, or mood change.     Objective:   No results found. Recent Labs    07/15/21 0506  WBC 5.4  HGB 13.0  HCT 40.2  PLT 217      Recent Labs    07/15/21 0506  NA 132*  K 3.7  CL 97*  CO2 27  GLUCOSE 118*  BUN 18  CREATININE 0.85  CALCIUM 9.0       Intake/Output Summary (Last 24 hours) at 07/15/2021 0913 Last data filed at 07/15/2021 0745 Gross per 24 hour  Intake 900 ml  Output 1550 ml  Net -650 ml        Physical Exam: Vital Signs Blood pressure 101/67, pulse 60, temperature 97.7 F (36.5 C), temperature source Oral, resp. rate 18, height '5\' 9"'$  (1.753 m), weight 80.3 kg, SpO2 100 %.    Constitutional: No distress . Vital signs reviewed. HEENT: EOMI, oral membranes moist Neck: supple Cardiovascular: RRR without murmur. No JVD    Respiratory/Chest: CTA Bilaterally without wheezes or rales. Normal effort    GI/Abdomen: BS +, non-tender, non-distended Ext: no clubbing, cyanosis, or edema Psych: pleasant and cooperative  Skin: No evidence of breakdown, no evidence of rash. Scalp wound almost healed. Musculoskeletal: Full range of motion in all 4 extremities. No joint swelling Neuro: Pt is cognitively appropriate with normal insight, memory, and awareness. Continued word finding deficits, processing delays.  Cranial nerves 2-12 are intact. Sensory exam is normal.extensor/flexor tone bilateral LE's (MAS 1-1+)  BUE motor 5/5. RLE 3+/5 prox to 1-2/5 distally, LLE 3-4/5 prox to  2/5--unchanged 7/24 Musculoskeletal: No pain with ROM. R heel cord a little tight   Assessment/Plan: 1. Functional deficits which  require 3+ hours per day of interdisciplinary therapy in a comprehensive inpatient rehab setting. Physiatrist is providing close team supervision and 24 hour management of active medical problems listed below. Physiatrist and rehab team continue to assess barriers to discharge/monitor patient progress toward functional and medical goals  Care Tool:  Bathing    Body parts bathed by patient: Face, Abdomen, Chest, Left arm, Right arm, Front perineal area, Buttocks, Right upper leg, Left upper leg, Right lower leg   Body parts bathed by helper: Buttocks (back) Body parts n/a: Left lower leg   Bathing assist Assist Level: Minimal Assistance - Patient > 75%     Upper Body Dressing/Undressing Upper body dressing   What is the patient wearing?: Pull over shirt    Upper body assist Assist Level: Set up assist    Lower Body Dressing/Undressing Lower body dressing      What is the patient wearing?: Pants     Lower body assist Assist for lower body dressing: Minimal Assistance - Patient > 75%     Toileting Toileting    Toileting assist Assist for toileting: Moderate Assistance - Patient 50 - 74%     Transfers Chair/bed transfer  Transfers  assist     Chair/bed transfer assist level: Supervision/Verbal cueing Chair/bed transfer assistive device: (P) Walker   Locomotion Ambulation   Ambulation assist      Assist level: Contact Guard/Touching assist Assistive device: Walker-rolling Max distance: 459 ft   Walk 10 feet activity   Assist  Walk 10 feet activity did not occur: Safety/medical concerns (limited by decreased balance and extensor hypertonicity)  Assist level: Contact Guard/Touching assist Assistive device: Walker-rolling   Walk 50 feet activity   Assist Walk 50 feet with 2 turns activity did not occur: Safety/medical concerns  Assist level: Contact Guard/Touching assist Assistive device: Walker-rolling    Walk 150 feet activity   Assist Walk 150  feet activity did not occur: Safety/medical concerns  Assist level: Contact Guard/Touching assist Assistive device: Walker-rolling    Walk 10 feet on uneven surface  activity   Assist Walk 10 feet on uneven surfaces activity did not occur: Safety/medical concerns   Assist level: Contact Guard/Touching assist Assistive device: Aeronautical engineer Will patient use wheelchair at discharge?: Yes Type of Wheelchair: Manual    Wheelchair assist level: Supervision/Verbal cueing Max wheelchair distance: 75    Wheelchair 50 feet with 2 turns activity    Assist        Assist Level: Supervision/Verbal cueing   Wheelchair 150 feet activity     Assist      Assist Level: Supervision/Verbal cueing   Blood pressure 101/67, pulse 60, temperature 97.7 F (36.5 C), temperature source Oral, resp. rate 18, height '5\' 9"'$  (1.753 m), weight 80.3 kg, SpO2 100 %.  Medical Problem List and Plan: 1.  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  Impaired mobility -DVT/anticoagulation:  Pharmaceutical: Continue Heparin 5,000U q8H             -antiplatelet therapy: 3. Pain Management: N/A 4. Mood: LCSW to follow for evaluation and support.              -antipsychotic agents: N/A 5. Neuropsych: This patient is capable of making decisions on his own behalf. 6. Skin/Wound Care: Remove staples 7/14  7. Fluids/Electrolytes/Nutrition: encourage PO  -solid PO 8. Spasticity:   baclofen   -rom, splinting. improving   baclofen reduced to '10mg'$  tid d/t dizziness, hypotension 9. Thrombocytopenia: Monitor for signs of bleeding.  -7/25 holding at 217k 10 Hyperglycemia:  Likely due to steroids. -- Hgb A1C 6.0. 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 Acute blood loss anemia: Monitor for signs of bleeding with drop in plts.             --hgb up to 13k 7/25 13. Chronic R-knee pain: S/P steroid injection 03/22 14. H/o Stroke with residual: 15. GERD?: Has been refusing PPI.  16. Constipation/neurogenic bowel:  -improved  7/25- stools regular and formed on qd miralax, hs senna-s 17. Neurogenic bladder:  -pvr's/cath volumes 300-800cc, occ incontinent partial void  -UC negative--   -continue flomax and increased urecholine to '50mg'$  tid 7/14  -rxing constipation  -toilet or bsc to void, standing if possible  7/25.  Caths down to bid   -continue caths prn   -  keep cath volumes between 300-500cc. Discouraged water "restriction"   -  urecholine '50mg'$  qid   -outpt urology consult will be requested    -continue with plan. Vitals:   07/14/21 1941 07/15/21 0425  BP: 106/62 101/67  Pulse: 67 60  Resp: 16 18  Temp: 98 F (36.7 C) 97.7 F (36.5 C)  SpO2: 98% 100%     LOS: 19 days A FACE TO FACE EVALUATION WAS PERFORMED  Meredith Staggers 07/15/2021, 9:13 AM

## 2021-07-15 NOTE — Progress Notes (Signed)
Patient ID: James Olsen, male   DOB: Aug 01, 1946, 74 y.o.   MRN: [id number]  SW met with pt in room to discuss [facility] preference. Prefers [facility] SW sent HHPT/OT/SN/aide referral to [facility] and waiting on follow-up.  Loralee Pacas, MSW, [place] Office: [phone] Cell: [phone] Fax: [phone]

## 2021-07-15 NOTE — Progress Notes (Signed)
Occupational Therapy Session Note  Patient Details  Name: SCOTT VANDERVEER MRN: [id number] Date of Birth: 21-Mar-1946  Today's Date: 07/15/2021 OT Individual Time: 1100-1200 OT Individual Time Calculation (min): 60 min    Short Term Goals: Week 3:  OT Short Term Goal 1 (Week 3): stg=ltg d/t elos  Skilled Therapeutic Interventions/Progress Updates:    Pt received sitting in the w/c with no c/o pain. Pt completed sit > stand with supervision, wearing R AFO and using RW. Pt completed ambulatory transfer into the bathroom with very light CGA- (S). Pt required min A for shower transfer for posterior entry to simulated walk in shower. Extensive discussion re home IADLs, grocery shopping, and meal preparation, with recommendations provided for he and his wife to conserve energy and maintain independence. Pt completed bathing seated on shower chair with distant supervision. Pt required mod cueing for sequencing sit > stand as pt became frustrated. Pt was eventually able to stand with supervision and complete peri hygiene with close supervision. Pt donned shirt with set up assist, pants with increased time/effort to use figure 4 technique but CGA overall. Grooming tasks completed at the sink with set up assist. Pt was left sitting up with all needs met in the w/c.   Therapy Documentation Precautions:  Precautions Precautions: Fall, Other (comment) Precaution Comments: BLE extension hypertonicity Restrictions Weight Bearing Restrictions: No  Therapy/Group: Individual Therapy  [name] 07/15/2021, 6:08 AM

## 2021-07-15 NOTE — Progress Notes (Signed)
Physical Therapy Session Note  Patient Details  Name: James Olsen MRN: [id number] Date of Birth: February 21, 1946  Today's Date: 07/15/2021 PT Individual Time: 850 [id number] and 1400-1455 PT Individual Time Calculation (min): 75 min and 55 min   Short Term Goals: Week 3:  PT Short Term Goal 1 (Week 3): STG=LTG due to ELOS.  Skilled Therapeutic Interventions/Progress Updates:     Session 1: Patient in bed upon PT arrival. Patient alert and agreeable to PT session. Patient denied pain during session. Patient requested to brush his teeth at beginning of session.  Therapeutic Activity: Bed Mobility: Patient performed supine to sit with supervision in a flat bed without use of bed rails. Provided verbal cues for bringing opposite shoulder forward with improved trunk control when coming to sitting. Transfers: Patient performed sit to/from stand x6 with supervision using RW. Provided verbal cues for controlled descent x2. ADLs: Patient brushed his teeth and washed his face independently at the sink with supervision-CGA for standing balance and cues for maintaining weight in his toes to reduce posterior sway/bias in standing. Patient voided in standing using the urinal with set-up assist and CGA for standing balance with RW. Provided cues for management of shorts and alternating single upper extremity support to improve balance and safety with this activity. Doffed non-skid socks and donned socks, shoes, and R AFO with total A for energy/time management.   Gait Training:  Patient ambulated ~200 feet using RW x2 with close supervision. Ambulated with reciprocal gait pattern, decreased R toe clearance due to decreased knee flexionin swing, decreased gait speed, downward head gaze, and increased adductor tone R>L. Provided verbal cues for looking ahead, increased hamstring activation in pre-swing, increased BOS, and increased L weight shift for improved R toe clearance. Patient ascended/descended 4x6" steps  using B rails with CGA. Performed step-to gait pattern leading with L while ascending and R while descending. Provided cues for technique and sequencing.   Neuromuscular Re-ed: Patient performed the following lower extremity motor control activities for improved functional mobility: -ascended/descended 4x6" steps using B rails with reciprocal gait pattern for increased R lower extremity motor planning with a functional task; provided min cues and assist for R foot placement while ascending and mod cues and assist for R eccentric control to lower to his L foot -lunges with L foot forward and R foot back with floor mat under patient to progress to kneeling on R knee 2x4 with tactile cues for R knee flexion, and progressed to kneeling and returned to standing x1 with min A to lower with control and max A to return to standing  Discussed patient's fear of falling and not being able to get up. Educated on fall recovery techniques to progress with HHPT, starting with kneeling and returning to standing using backward chaining technique. Patient appreciative of education and activity performed for kneeling during session.   Patient in w/c in the room at end of session with breaks locked, chair alarm set, and all needs within reach.   Session 2: Patient in bed upon PT arrival. Patient alert and agreeable to PT session. Patient denied pain during session, reported increased "heaviness" in R leg. Educated on muscle fatigue from high demand activities, stairs and lunges, during previous session.   Therapeutic Activity: Bed Mobility: Patient performed supine to/from sit with supervision-mod I for increased time in a flat bed without use of bed rails. Doffed non-skid socks and donned socks, shoes, and R AFO with total A for energy/time management.  Transfers: Patient performed  sit to/from stand x2 and stand pivot to [facility] over the toilet with CGA-min A using RW, increased assist due to fatigue. Provided verbal cues  for forward weight shift and gluteal activation for boosting up and hip extension. Patient voided in standing using the urinal with set-up assist and CGA for standing balance with RW. Provided cues for management of shorts and alternating single upper extremity support to improve balance and safety with this activity. Patient initially with increased posterior lean and returned to sitting abruptly when having both hands off the RW to place the urinal. The urinal dropped on the floor and PT retrieved the urinal and cleaned a small spill before the patient returned to standing, as above.  Gait Training:  Patient ambulated 50 feet and 430 feet using RW with close supervision. Ambulated as above focused on R foot clearance, stepping over low obstacles to simulate area rugs at home, and weaving over 40 feet to work on foot placement during turns.   Patient on the toilet to have a BM handed off to NT at end of session.   Therapy Documentation Precautions:  Precautions Precautions: Fall, Other (comment) Precaution Comments: BLE extension hypertonicity Restrictions Weight Bearing Restrictions: No    Therapy/Group: Individual Therapy  Eusebio Blazejewski L Lanier Millon PT, DPT  07/15/2021, 4:41 PM

## 2021-07-16 MED ORDER — ACETAMINOPHEN 325 MG PO TABS: 325.0000 mg | ORAL_TABLET | ORAL | Status: DC | PRN

## 2021-07-16 NOTE — Progress Notes (Signed)
Patient self cath with education provided as needed

## 2021-07-16 NOTE — Progress Notes (Signed)
Occupational Therapy Session Note  Patient Details  Name: James Olsen MRN: [id number] Date of Birth: 1946-02-07  Today's Date: 07/16/2021 OT Individual Time: [id number] OT Individual Time Calculation (min): 49 min    Short Term Goals: Week 3:  OT Short Term Goal 1 (Week 3): stg=ltg d/t elos  Skilled Therapeutic Interventions/Progress Updates:    Pt agreeable to therapy but reports some increased back pain after working with PT.  He was agreeable to completion of shower and letting the warm water run on his back.  He was already using a heating pad prior to session as well.  Next, had him complete transfer to sitting with supervision.  He then ambulated to the shower bench with min guard assist using the RW for support.  Min assist needed for removal of LB clothing with supervision for UB.  He was issued a LH sponge for washing his lower legs and feet as well as his back.  Min guard assist to complete all aspects of showering with transfer back out to the EOB at the same level with use of the RW for dressing tasks.  He was able to complete donning a pullover shirt with setup and then donned his shorts with min assist using the reacher to help thread them.  Next, he donned his gripper socks, crossing one LE over the other with min assist.  Min assist for dynamic sitting balance as well as he demonstrated posterior LOB when attempting to cross the LLE over the right knee.  Finished session with pt in supine and with the call button and phone in reach and bed alarm in place.    Therapy Documentation Precautions:  Precautions Precautions: Fall Precaution Comments: BLE extension hypertonicity Restrictions Weight Bearing Restrictions: No  Pain: Pain Assessment Pain Scale: Faces Pain Score: 0-No pain Faces Pain Scale: Hurts a little bit Pain Type: Acute pain Pain Location: Back Pain Orientation: Mid Pain Descriptors / Indicators: Discomfort Pain Onset: With Activity Pain  Intervention(s): Repositioned ADL: See Care Tool Section for some details of mobility and selfcare  Therapy/Group: Individual Therapy  Alen Matheson OTR/L 07/16/2021, 4:17 PM

## 2021-07-16 NOTE — Progress Notes (Signed)
Physical Therapy Session Note  Patient Details  Name: James Olsen MRN: [id number] Date of Birth: [birth year]  Today's Date: 07/16/2021 PT Individual Time: 0915-1000 and 1300-1355 PT Individual Time Calculation (min): 45 min and 55 min  Short Term Goals: Week 3:  PT Short Term Goal 1 (Week 3): STG=LTG due to ELOS.  Skilled Therapeutic Interventions/Progress Updates:     Session 1: Patient in the bathroom toileting upon PT arrival. Patient missed 15 min of skilled PT due to toileting, RN made aware. Will attempt to make-up missed time as able.   Patient alert and agreeable to PT session. Patient denied pain during session. Patient reports that he had a poor nights sleep due to urinary urgency and frequency and reported increased fatigue and "muscle tightness" today. Educated on shifting fluid intake to earlier in the day and reducing intake in the evenings to manage night time frequency. Emphasized to continue the 8x8oz minimum per day, and just increase morning and afternoon intake.   Therapeutic Activity: Bed Mobility: Patient performed sit to supine with mod I for increased time in a flat bed without use of bed rails.  Transfers: Patient performed stand pivot toilet>w/c and sit to/from stand using RW with supervision-mod I. Provided verbal cues for controlled descent x1. Performed peri-care and lower body clothing management with min/mod A due to increased fatigue.   Gait Training:  Patient ambulated 10 feet using RW with supervision in non-skid socks. Ambulated with decreased DF and knee flexion on R with reduced toe clearance x1. Provided verbal cues for increased hamstring activation for improved toe clearance during R swing phase.  Neuromuscular Re-ed: Patient performed the following activities for management of B lower extremity extensor tone: -B progressive pressure DF stretch in sitting x30 sec -B progressive pressure DF and hamstring stretch in supine 2x1 min -B progressive  pressure hip flexion, abduction, and IR/ER stretching 2x30 sec each motion  Manual Therapy: -soft tissue mobilization with graded pressure for muscle relaxation and management of hypertonicity to B gastroc/soleus, hamstrings, quadriceps, and R hip flexor -R hip distraction mobilization 2x45 sec and prolonged distraction x30 sec for improved hip joint mobility due to capsular restriction at end range with hip flexion and IR  Patient in bed at end of session with breaks locked, bed alarm set, and all needs within reach.   Session 2: Patient in bed with LPN in the room upon PT arrival. Patient alert and agreeable to PT session. Patient reported 4/10 R low back pain, indicated muscular pain at quadratus lumborum, during session, RN made aware. PT provided repositioning, rest breaks, and distraction as pain interventions throughout session.  Continues to report increased fatigue today, but did state he was able to nap between morning and afternoon sessions.   Focused first part of session on orthotics consult for R AFO and toe cap with Gerald Stabs, [facility].   Therapeutic Activity: Bed Mobility: Patient performed supine to/from sit with mod I in a flat bed with min use of bed rail when bringing his R leg onto the bed due to fatigue.  Transfers: Patient performed sit to/from stand x2 with mod I using RW.   Orthotics consult:  Patient ambulated 80 feet x2 using RW with supervision. Ambulated with R PLS AFO on first trial and without AFO on second trial with increased toe drag on second trial. Discussed benefits of PLS AFO and toe cap to improve R toe clearance for safety with mobility and increased independence at d/c. Educated on progressing without the  AFO with therapy only at d/c to eventually not need the AFO, patient in agreement. Educated about toe cap as a permanent modification to his personal shoe and benefits, patient in agreement to this modification. CPO obtained patient's shoe for modification at end of  session.   Discussed d/c planning, d/c process, home safety, and HEP with patient during session. Retrieved and applied head pack to patient's back for muscle tension/pain management at end of session. Placed 6 layers between pack and patient's skin and provided instructions for patient to call if the pack felt hot and to have staff check his skin on removal in 15 min, patient stated understanding.   Patient in bed at end of session with breaks locked, bed alarm set, and all needs within reach.   Therapy Documentation Precautions:  Precautions Precautions: Fall Precaution Comments: BLE extension hypertonicity Restrictions Weight Bearing Restrictions: No General: PT Amount of Missed Time (min): 15 Minutes PT Missed Treatment Reason: Toileting  Therapy/Group: Individual Therapy  Kortlyn Koltz L Calyb Mcquarrie PT, DPT  07/16/2021, 4:28 PM

## 2021-07-16 NOTE — Patient Care Conference (Signed)
Inpatient RehabilitationTeam Conference and Plan of Care Update Date: 07/16/2021   Time: 10:20 AM    Patient Name: James Olsen      Medical Record Number: [id number]  Date of Birth: June 18, 1946 Sex: Male         Room/Bed: 4W13C/4W13C-01 Payor Info: Payor: MEDICARE / Plan: MEDICARE PART A AND B / Product Type: *No Product type* /    Admit Date/Time:  06/26/2021  3:14 PM  Primary Diagnosis:  Atypical meningioma of brain [facility])  Hospital Problems: Principal Problem:   Atypical meningioma of brain [facility]) Active Problems:   S/P resection of meningioma    Expected Discharge Date: Expected Discharge Date: 07/18/21  Team Members Present: Physician leading conference: Dr. Alger Simons Social Worker Present: Loralee Pacas, [place] Nurse Present: Dorthula Nettles, RN PT Present: Apolinar Junes, PT OT Present: Laverle Hobby, OT PPS Coordinator present : Gunnar Fusi, SLP     Current Status/Progress Goal Weekly Team Focus  Bowel/Bladder   cont of bowel and bladder. urinaary retention at times. self cath  ptt will be able to urinate without cath  assess every shift and prn   Swallow/Nutrition/ Hydration             ADL's   (S) UB ADLs, CGA LB ADLs, CGA transfers  supervision  ADLs, transfers, safety, d/c planning and family edu   Mobility   Supervision overall, gait >450 ft with RW, CGA 4 steps  Mod I-supervision overall  Gait and stair training, balance, functional mobility, lower extremity tone management, patient/caregiver education   Communication             Safety/Cognition/ Behavioral Observations            Pain   no pain  pt remain with no pain  assess every shift   Skin   skin is dry and intact  skin remain intact  assess every shift     Discharge Planning:  D/c to home with 24/7 care from wife.   Team Discussion: Heel cord tightness, voiding more. Cathed couple times per day, urology will follow up. Continent B/B, but has trouble starting stream. No  reports of pain, sleep, or skin issues. Educating on transfers and self-cathing. Patient on target to meet rehab goals: yes, contact guard for upper body, lower body, transfers. Appears to be getting depressed. On track to meet goals. Progressed to supervision. Walking to all appointments, all gyms. Doesn't open up very well. Needs encouragement.  *See Care Plan and progress notes for long and short-term goals.   Revisions to Treatment Plan:  Not at this time.  Teaching Needs: Family education, medication management, transfer training, gait training, balance training, endurance training, safety awareness.  Current Barriers to Discharge: Decreased caregiver support, Medical stability, Home enviroment access/layout, Lack of/limited family support, Weight, Medication compliance, and Behavior  Possible Resolutions to Barriers: Continue current medication, provide emotional support.     Medical Summary Current Status: still with R>L tone, heel cord tightness. Voiding more, learned self-caths. req cath bid currently.  Barriers to Discharge: Medical stability   Possible Resolutions to [employer] Focus: daily assessment of labs and pt data, neurogenic bladder mgt   Continued Need for Acute Rehabilitation Level of Care: The patient requires daily medical management by a physician with specialized training in physical medicine and rehabilitation for the following reasons: Direction of a multidisciplinary physical rehabilitation program to maximize functional independence : Yes Medical management of patient stability for increased activity during participation in an intensive rehabilitation  regime.: Yes Analysis of laboratory values and/or radiology reports with any subsequent need for medication adjustment and/or medical intervention. : Yes   I attest that I was present, lead the team conference, and concur with the assessment and plan of the team.   Cristi Loron 07/16/2021, 3:07 PM

## 2021-07-16 NOTE — Progress Notes (Signed)
Occupational Therapy Discharge Summary  Patient Details  Name: James Olsen MRN: [id number] Date of Birth: [birth year]  Today's Date: 07/17/2021 OT Individual Time: 1431-1505 OT Individual Time Calculation (min): 34 min    Patient has met 5 of 7 long term goals due to improved activity tolerance, improved balance, postural control, ability to compensate for deficits, functional use of  RIGHT upper and RIGHT lower extremity, improved attention, improved awareness, and improved coordination.  Patient to discharge at overall Supervision level.  Patient's care partner is independent to provide the necessary physical assistance at discharge.    Reasons goals not met: Pt did not meet shower goal or LB dressing goal as pt requires CGA for crossing threshold into shower and A to don R AFO in shoe (MIN A). Pt wife able to provide this level of A at home and has been through education.   Recommendation:  Patient will benefit from ongoing skilled OT services in home health setting to continue to advance functional skills in the area of BADL.  Equipment: BSC  Reasons for discharge: treatment goals met and discharge from hospital  Patient/family agrees with progress made and goals achieved: Yes  OT Discharge Precautions/Restrictions  Precautions Precautions: Fall Restrictions Weight Bearing Restrictions: No  Pain Pain Assessment Pain Scale: 0-10 Pain Score: 3  Pain Location: Back Pain Orientation: Right;Mid Pain Intervention(s): Medication (See eMAR) ADL ADL Eating: Independent Where Assessed-Eating: Wheelchair Grooming: Modified independent Where Assessed-Grooming: Sitting at sink Upper Body Bathing: Modified independent Where Assessed-Upper Body Bathing: Shower Lower Body Bathing: Contact guard Where Assessed-Lower Body Bathing: Shower Upper Body Dressing: Supervision/safety Where Assessed-Upper Body Dressing: Sitting at sink Lower Body Dressing: Supervision/safety Where  Assessed-Lower Body Dressing: Standing at sink Toileting: Supervision/safety Where Assessed-Toileting: Glass blower/designer: Close supervision Armed forces technical officer Method: Counselling psychologist: Geophysical data processor: Close supervision Social research officer, government Method: Heritage manager: Civil engineer, contracting with back Vision Baseline Vision/History: Wears glasses Wears Glasses: At all times Patient Visual Report: No change from baseline Vision Assessment?: No apparent visual deficits Perception  Perception: Within Functional Limits Praxis Praxis: Intact Cognition Overall Cognitive Status: Within Functional Limits for tasks assessed Arousal/Alertness: Awake/alert Orientation Level: Oriented X4 Attention: Selective Sustained Attention: Appears intact Selective Attention: Appears intact Memory: Appears intact Awareness: Appears intact Safety/Judgment: Appears intact Sensation Sensation Light Touch: Appears Intact Hot/Cold: Appears Intact Coordination Gross Motor Movements are Fluid and Coordinated: No Fine Motor Movements are Fluid and Coordinated: Yes Coordination and Movement Description: R hemi, RLE>RUE, decreased coordination Motor  Motor Motor: Hemiplegia;Abnormal tone Mobility  Bed Mobility Bed Mobility: Rolling Right;Rolling Left;Supine to Sit;Sit to Supine Rolling Right: Supervision/verbal cueing Rolling Left: Supervision/Verbal cueing Supine to Sit: Supervision/Verbal cueing Sit to Supine: Supervision/Verbal cueing Transfers Sit to Stand: Supervision/Verbal cueing Stand to Sit: Supervision/Verbal cueing  Trunk/Postural Assessment  Cervical Assessment Cervical Assessment: Within Functional Limits Thoracic Assessment Thoracic Assessment: Within Functional Limits Lumbar Assessment Lumbar Assessment: Exceptions to [facility] (posterior pelvic tilt) Postural Control Postural Control: Deficits on evaluation (righting reactions  delayed)  Balance Balance Balance Assessed: Yes Static Sitting Balance Static Sitting - Balance Support: Bilateral upper extremity supported;Feet supported Static Sitting - Level of Assistance: 6: Modified independent (Device/Increase time) Dynamic Sitting Balance Dynamic Sitting - Balance Support: Right upper extremity supported;Left upper extremity supported;Feet supported Dynamic Sitting - Level of Assistance: 6: Modified independent (Device/Increase time) Static Standing Balance Static Standing - Balance Support: Bilateral upper extremity supported;During functional activity Static Standing - Level of Assistance: 5: Stand by assistance Dynamic  Standing Balance Dynamic Standing - Balance Support: Bilateral upper extremity supported;During functional activity Dynamic Standing - Level of Assistance: 5: Stand by assistance Extremity/Trunk Assessment RUE Assessment RUE Assessment: Within Functional Limits LUE Assessment LUE Assessment: Within Functional Limits   [name] 07/16/2021, 3:14 PM

## 2021-07-16 NOTE — Progress Notes (Signed)
PROGRESS NOTE   Subjective/Complaints: Continues to progress with bladder. Still typically 2 caths per day. Bowels moving. Had questions re:f/u referrals. OT reported signs of depression. Pt did not disclose any feelings to me this week.   ROS: Patient denies fever, rash, sore throat, blurred vision, nausea, vomiting, diarrhea, cough, shortness of breath or chest pain, joint or back pain, headache,     Objective:   No results found. Recent Labs    07/15/21 0506  WBC 5.4  HGB 13.0  HCT 40.2  PLT 217      Recent Labs    07/15/21 0506  NA 132*  K 3.7  CL 97*  CO2 27  GLUCOSE 118*  BUN 18  CREATININE 0.85  CALCIUM 9.0       Intake/Output Summary (Last 24 hours) at 07/16/2021 1302 Last data filed at 07/16/2021 0835 Gross per 24 hour  Intake 356 ml  Output 1370 ml  Net -1014 ml        Physical Exam: Vital Signs Blood pressure 103/64, pulse 62, temperature 97.6 F (36.4 C), resp. rate 17, height '5\' 9"'$  (1.753 m), weight 80.3 kg, SpO2 100 %.    Constitutional: No distress . Vital signs reviewed. HEENT: EOMI, oral membranes moist Neck: supple Cardiovascular: RRR without murmur. No JVD    Respiratory/Chest: CTA Bilaterally without wheezes or rales. Normal effort    GI/Abdomen: BS +, non-tender, non-distended Ext: no clubbing, cyanosis, or edema Psych: seems in relative good spirits. Skin: No evidence of breakdown, no evidence of rash. Scalp wound almost healed. Musculoskeletal: Full range of motion in all 4 extremities. No joint swelling Neuro: Pt is cognitively appropriate with normal insight, memory, and awareness. Continued word finding deficits, processing delays.  Cranial nerves 2-12 are intact. Sensory exam is normal.extensor/flexor tone bilateral LE's (MAS 1-1+)  BUE motor 5/5. RLE 3+/5 prox to 1-2/5 distally, LLE 3-4/5 prox to  2/5--unchanged 7/24 Musculoskeletal: No pain with ROM. R heel cord a  little tighter than left.   Assessment/Plan: 1. Functional deficits which require 3+ hours per day of interdisciplinary therapy in a comprehensive inpatient rehab setting. Physiatrist is providing close team supervision and 24 hour management of active medical problems listed below. Physiatrist and rehab team continue to assess barriers to discharge/monitor patient progress toward functional and medical goals  Care Tool:  Bathing    Body parts bathed by patient: Face, Abdomen, Chest, Left arm, Right arm, Front perineal area, Buttocks, Right upper leg, Left upper leg, Right lower leg, Left lower leg   Body parts bathed by helper: Buttocks (back) Body parts n/a: Left lower leg   Bathing assist Assist Level: Contact Guard/Touching assist     Upper Body Dressing/Undressing Upper body dressing   What is the patient wearing?: Pull over shirt    Upper body assist Assist Level: Set up assist    Lower Body Dressing/Undressing Lower body dressing      What is the patient wearing?: Pants     Lower body assist Assist for lower body dressing: Contact Guard/Touching assist     Toileting Toileting    Toileting assist Assist for toileting: Contact Guard/Touching assist     Transfers Chair/bed  transfer  Transfers assist     Chair/bed transfer assist level: Supervision/Verbal cueing Chair/bed transfer assistive device: Programmer, multimedia   Ambulation assist      Assist level: Supervision/Verbal cueing Assistive device: Walker-rolling Max distance: 430 ft   Walk 10 feet activity   Assist  Walk 10 feet activity did not occur: Safety/medical concerns (limited by decreased balance and extensor hypertonicity)  Assist level: Supervision/Verbal cueing Assistive device: Walker-rolling   Walk 50 feet activity   Assist Walk 50 feet with 2 turns activity did not occur: Safety/medical concerns  Assist level: Supervision/Verbal cueing Assistive device:  Walker-rolling    Walk 150 feet activity   Assist Walk 150 feet activity did not occur: Safety/medical concerns  Assist level: Supervision/Verbal cueing Assistive device: Walker-rolling    Walk 10 feet on uneven surface  activity   Assist Walk 10 feet on uneven surfaces activity did not occur: Safety/medical concerns   Assist level: Contact Guard/Touching assist Assistive device: Aeronautical engineer Will patient use wheelchair at discharge?: Yes Type of Wheelchair: Manual    Wheelchair assist level: Supervision/Verbal cueing Max wheelchair distance: 75    Wheelchair 50 feet with 2 turns activity    Assist        Assist Level: Supervision/Verbal cueing   Wheelchair 150 feet activity     Assist      Assist Level: Supervision/Verbal cueing   Blood pressure 103/64, pulse 62, temperature 97.6 F (36.4 C), resp. rate 17, height '5\' 9"'$  (1.753 m), weight 80.3 kg, SpO2 100 %.  Medical Problem List and Plan: 1.  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 Mickeal Skinner 2.  Impaired mobility -DVT/anticoagulation:  Pharmaceutical: Continue Heparin 5,000U q8H             -antiplatelet therapy: 3. Pain Management: N/A 4. Mood: will speak to patient re: his mood.              -antipsychotic agents: N/A 5. Neuropsych: This patient is capable of making decisions on his own behalf. 6. Skin/Wound Care: Remove staples 7/14  7. Fluids/Electrolytes/Nutrition: encourage PO  -solid PO 8. Spasticity:   baclofen   -rom, splinting. improving   baclofen reduced to '10mg'$  tid d/t dizziness, hypotension 9. Thrombocytopenia: Monitor for signs of bleeding.  -7/25 holding  at 217k 10 Hyperglycemia: Likely due to steroids. -- Hgb A1C 6.0. 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 Acute blood loss anemia: Monitor for signs of bleeding with drop in plts.             --hgb up to 13k 7/25 13. Chronic R-knee pain: S/P steroid injection 03/22 14. H/o Stroke with residual: 15. GERD?: protonix 16. Constipation/neurogenic bowel:  -improved  7/25- stools regular and formed on qd miralax, hs senna-s 17. Neurogenic bladder:  -pvr's/cath volumes 300-800cc, occ incontinent partial void  -UC negative--   -continue flomax and increased urecholine to '50mg'$  tid 7/14  -rxing constipation  -toilet or bsc to void, standing if possible  7/25.  Caths down to bid   -  continue caths prn   -  keep cath volumes between 300-500cc.    -  urecholine '50mg'$  qid   -will request referral to outpt urology      -he has physical awareness that bladder is full Vitals:   07/15/21 2023 07/16/21 0336  BP: 109/69 103/64  Pulse: 72 62  Resp: 18 17  Temp: 97.8 F (36.6 C) 97.6 F (36.4 C)  SpO2: 99% 100%     LOS: 20 days A FACE TO FACE EVALUATION WAS PERFORMED  Meredith Staggers 07/16/2021, 1:02 PM

## 2021-07-16 NOTE — Progress Notes (Signed)
Patient ID: James Olsen, male   DOB: October 16, 1946, 75 y.o.   MRN: [id number]  Pt accepted with [facility] for HHPT/OT/SN/aide.   SW met with pt in room to provide updates from team conference, d/c date remains 7/28, HHA, and will d/c with catheters and physician to schedule outpatient urology referral. Pt would like SW to order RW and 3in1 BSC. Pt aware SW to follow- up with his wife.   SW spoke with pt wife James Olsen [phone]) to discuss above. Wife requested handicap placard.   SW ordered DME: RW and 3in1 BSC.   Loralee Pacas, MSW, [place] Office: [phone] Cell: [phone] Fax: [phone]

## 2021-07-16 NOTE — Progress Notes (Signed)
Orthopedic Tech Progress Note Patient Details:  HAMDAAN KOEGLER 05-15-1946 [id number]  Called in order to [facility] for a TOE CAP and an AFO CONSULT   Patient ID: EYDEN SCHEY, male   DOB: 09-14-1946, 75 y.o.   MRN: [id number]  Janit Pagan 07/16/2021, 3:11 PM

## 2021-07-16 NOTE — Progress Notes (Signed)
Occupational Therapy Session Note  Patient Details  Name: James Olsen MRN: [id number] Date of Birth: November 09, 1946  Today's Date: 07/16/2021 OT Individual Time: 0730-0830 OT Individual Time Calculation (min): 60 min    Short Term Goals: Week 3:  OT Short Term Goal 1 (Week 3): stg=ltg d/t elos  Skilled Therapeutic Interventions/Progress Updates:    Pt supine requesting to use the bathroom, no c/o pain. Bed mobility with (S). Sit > stand with supervision and CGA-(S) for ambulatory transfer into the baththroom. All toileting tasks with supervision overall. Cueing provided for problem solving, pt fatigued from poor night of sleep and yesterday's activities, likely affecting processing speed. Pt returned to the w/c and was taken to the ADL apt for focus on kitchen accessibility. Edu focused on energy conservation, fall risk reduction, and safety overall. As always, pt very receptive and appreciative of education. Pt able to return demo, transferring pot of water along countertop with supervision overall. Lengthy discussion re mental health, self efficacy and adjustment to ongoing physical deficits. Pt returned to his room and was left sitting up with all needs met in w/c.   Therapy Documentation Precautions:  Precautions Precautions: Fall, Other (comment) Precaution Comments: BLE extension hypertonicity Restrictions Weight Bearing Restrictions: No Therapy/Group: Individual Therapy  [name] 07/16/2021, 6:41 AM

## 2021-07-17 DIAGNOSIS — R339 Retention of urine, unspecified: Secondary | ICD-10-CM

## 2021-07-17 MED ORDER — BACLOFEN 10 MG PO TABS: 10.0000 mg | ORAL_TABLET | Freq: Three times a day (TID) | ORAL | 0 refills | Status: DC

## 2021-07-17 MED ORDER — PANTOPRAZOLE SODIUM 40 MG PO TBEC: 40.0000 mg | DELAYED_RELEASE_TABLET | Freq: Every day | ORAL | 0 refills | Status: DC

## 2021-07-17 MED ORDER — SENNOSIDES-DOCUSATE SODIUM 8.6-50 MG PO TABS: 1.0000 | ORAL_TABLET | Freq: Every day | ORAL | 0 refills | Status: DC

## 2021-07-17 MED ORDER — TRAZODONE HCL 50 MG PO TABS: 25.0000 mg | ORAL_TABLET | Freq: Every evening | ORAL | 0 refills | Status: DC | PRN

## 2021-07-17 MED ORDER — TAMSULOSIN HCL 0.4 MG PO CAPS: 0.4000 mg | ORAL_CAPSULE | Freq: Every day | ORAL | 0 refills | Status: DC

## 2021-07-17 MED ORDER — LIDOCAINE HCL URETHRAL/MUCOSAL 2 % EX GEL: 1.0000 "application " | CUTANEOUS | 0 refills | Status: DC | PRN

## 2021-07-17 MED ORDER — BETHANECHOL CHLORIDE 50 MG PO TABS: 50.0000 mg | ORAL_TABLET | Freq: Four times a day (QID) | ORAL | 0 refills | Status: DC

## 2021-07-17 MED ORDER — POLYETHYLENE GLYCOL 3350 17 G PO PACK: 17.0000 g | PACK | Freq: Every day | ORAL | 0 refills | Status: DC

## 2021-07-17 NOTE — Progress Notes (Signed)
Pt educated on medication management, pain management, and self catheterizations. Sheela Stack, LPN

## 2021-07-17 NOTE — Progress Notes (Signed)
Occupational Therapy Session Note  Patient Details  Name: LUCINO RASH MRN: [id number] Date of Birth: 1946/10/29  Today's Date: 07/17/2021 OT Individual Time: 1431-1505 OT Individual Time Calculation (min): 34 min    Short Term Goals: Week 3:  OT Short Term Goal 1 (Week 3): stg=ltg d/t elos  Skilled Therapeutic Interventions/Progress Updates:    Pt in bed to start session with transfer to sitting EOB with supervision.  Focused on working on donning his right shoe and AFO for LB dressing.  He needed overall min assist to complete with initial mod assist for placement and integration of a LH shoe horn.  Transitioned to use of a shoe funnel and he was able to first place his foot in the shoe by crossing his leg over the left knee.  He then placed the foot on the ground and with increased time, he could get his right heel down in the shoe.  Still with min assist overall needed secondary to weakness in the RLE when crossing it over and maintaining it on the left knee.  Next, had him complete toile transfer secondary to request to go to the bathroom.  He was able to complete with supervision including clothing management.  Left him on the toilet with NT in to supervise for safety.    Therapy Documentation Precautions:  Precautions Precautions: Fall Precaution Comments: BLE extension hypertonicity Restrictions Weight Bearing Restrictions: No  Pain: Pain Assessment Pain Scale: Faces Faces Pain Scale: Hurts a little bit ADL: See Care Tool Section for some details of mobility and selfcare  Therapy/Group: Individual Therapy  Elspeth Blucher OTR/L 07/17/2021, 4:12 PM

## 2021-07-17 NOTE — Progress Notes (Signed)
Pt has incomplete bladder emptying due to neurogenic bladder. Due to his chronic urine retention, he will need to perform CIC up to 3x per day with 14FR straight tip catheter.   Meredith Staggers, MD, [facility] 07/17/2021

## 2021-07-17 NOTE — Progress Notes (Signed)
Patient ID: James Olsen, male   DOB: 09-29-1946, 75 y.o.   MRN: [id number]  SW placed catheter referral with [facility] for 69f- Glide by [facility].  *Annie Mainreports there will be f/u with patient.   SW provided pt with samples and informed on above.   ALoralee Pacas MSW, L[place] CityOffice: [phone]: [phone]: [phone]

## 2021-07-17 NOTE — Progress Notes (Signed)
Physical Therapy Discharge Summary  Patient Details  Name: James Olsen MRN: [id number] Date of Birth: 22-Apr-1946  Today's Date: 07/17/2021 PT Individual Time: 1010-1130 PT Individual Time Calculation (min): 80 min    Patient has met 9 of 9 long term goals due to improved activity tolerance, improved balance, improved postural control, increased strength, ability to compensate for deficits, and improved coordination.  Patient to discharge at an ambulatory level Supervision.   Patient's care partner is independent to provide the necessary physical assistance at discharge.  Reasons goals not met: n/a  Recommendation:  Patient will benefit from ongoing skilled PT services in home health setting to continue to advance safe functional mobility, address ongoing impairments in balance, strength, extensor hypertonicity in B LEs, activity tolerance, functional mobility, gait and stair training, community integration, patient/caregiver eduation, and minimize fall risk.  Equipment: RW  Reasons for discharge: treatment goals met  Patient/family agrees with progress made and goals achieved: Yes  Skilled Therapeutic Interventions: Patient in w/c in the room upon PT arrival. Patient alert and agreeable to PT session. Patient denied pain during session. Patient reported improved sleep, but increased fatigue. BP 102/63 in sitting, 92/70 in standing, recovered to 100/71 in standing after 2 min. Educated on continued progress with fluid intake and working to create a daily routine and log for fluid intake at home.   Therapeutic Activity: Bed Mobility: Patient performed supine to/from sit and rolling supine<>prone independently on a mat table and sit to supine on a flat bed without use of bed rails independently. Transfers: Patient performed sit to/from stand and stand pivot with mod I using RW throughout session. Patient performed an ambulatory transfer to the bathroom, bringing his urinal in on his  RW and performed standing voiding using the urinal with mod I, leaving the urinal for PT to measure and empty.    Therapeutic Exercise: Patient performed the one set of the following exercises, provided with HEP handout, with verbal and tactile cues for proper technique. Access Code: [email or web address] Seated Piriformis Stretch - 2-3 x daily - 7 x weekly - 1 sets - 2 reps - 30 sec hold Seated Hamstring Stretch with Strap - 2-3 x daily - 7 x weekly - 1 sets - 2 reps - 30-60 sec hold Supine Heel Slide - 2-3 x daily - 7 x weekly - 2 sets - 10 reps Supine Bridge - 2-3 x daily - 7 x weekly - 2 sets - 5-10 reps Prone Press Up On Elbows - 2-3 x daily - 7 x weekly - 1 sets - 2 reps - 30-60 sec hold Prone Press Up - 2-3 x daily - 7 x weekly - 2 sets - 10 reps Supine Single Knee to Chest Stretch - 2-3 x daily - 7 x weekly - 1 sets - 2 reps - 60 sec hold Supine Bilateral Hip Internal Rotation Stretch - 2 x daily - 7 x weekly - 1 sets - 2 reps - 30-60 sec hold  Educated on PT goals for HHPT, progression out of AFO, progressive walking program outside at cooler hours, fall risk/prevention, home modifications to prevent falls, and activation of emergency services in the event of a fall during session.   Patient in bed with his wife at bedside at end of session with breaks locked, bed alarm set, and all needs within reach.   PT Discharge Precautions/Restrictions Precautions Precautions: Fall Precaution Comments: BLE extension hypertonicity Restrictions Weight Bearing Restrictions: No Vision/Perception  Perception Perception: Within Functional Limits Praxis Praxis:  Intact Praxis Impairment Details: Motor planning  Cognition Overall Cognitive Status: Within Functional Limits for tasks assessed Arousal/Alertness: Awake/alert Attention: Selective Sustained Attention: Appears intact Selective Attention: Appears intact Memory: Appears intact Awareness: Appears intact Safety/Judgment: Appears  intact Sensation Sensation Light Touch: Impaired Detail Peripheral sensation comments: has intermittent numbness/tingling in R side, able to loate light touch with testing Light Touch Impaired Details: Impaired RLE;Impaired RUE Hot/Cold: Appears Intact Proprioception: Impaired by gross assessment Stereognosis: Not tested Coordination Gross Motor Movements are Fluid and Coordinated: No Fine Motor Movements are Fluid and Coordinated: No Coordination and Movement Description: R hemi with B LE hypertonicity R>L in extension pattern, decreased coordination Motor  Motor Motor: Hemiplegia;Abnormal postural alignment and control;Abnormal tone  Mobility Bed Mobility Rolling Right: Independent Rolling Left: Independent Supine to Sit: Independent Sit to Supine: Independent Transfers Transfers: Sit to Stand;Stand to Sit;Stand Pivot Transfers Sit to Stand: Independent with assistive device Stand to Sit: Independent with assistive device Stand Pivot Transfers: Independent with assistive device Transfer (Assistive device): Rolling walker Locomotion  Gait Ambulation: Yes Gait Assistance: Supervision/Verbal cueing Gait Distance (Feet): 459 Feet Assistive device: Rolling walker Gait Assistance Details: Verbal cues for gait pattern Gait Gait: Yes Gait Pattern: Step-through pattern;Decreased stance time - right;Decreased hip/knee flexion - right;Decreased stride length;Decreased dorsiflexion - right;Decreased dorsiflexion - left;Decreased weight shift to left;Trunk flexed;Decreased trunk rotation;Lateral hip instability;Poor foot clearance - right;Narrow base of support Gait velocity: 0.39 m/s (avg gait speed on 6MWT) Stairs / Additional Locomotion Stairs: Yes Stairs Assistance: Contact Guard/Touching assist Stair Management Technique: Two rails Number of Stairs: 4 Height of Stairs: 6 Ramp: Contact Guard/touching assist Curb: Contact Guard/Touching assist Wheelchair Mobility Wheelchair  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, petroleum)  Trunk/Postural Assessment  Cervical Assessment Cervical Assessment: Within Functional Limits Thoracic Assessment Thoracic Assessment: Within Functional Limits Lumbar Assessment Lumbar Assessment: Exceptions to [facility] (posterior pelvic tilt) Postural Control Postural Control: Deficits on evaluation (righting reactions delayed)  Balance Standardized Balance Assessment Standardized Balance Assessment: Berg Balance Test Berg Balance Test Sit to Stand: Able to stand  independently using hands Standing Unsupported: Able to stand 2 minutes with supervision Sitting with Back Unsupported but Feet Supported on Floor or Stool: Able to sit safely and securely 2 minutes Stand to Sit: Controls descent by using hands Transfers: Needs one person to assist Standing Unsupported with Eyes Closed: Able to stand 10 seconds safely Standing Ubsupported with Feet Together: Needs help to attain position but able to stand for 30 seconds with feet together From Standing, Reach Forward with Outstretched Arm: Can reach forward >5 cm safely (2") From Standing Position, Pick up Object from Floor: Unable to pick up shoe, but reaches 2-5 cm (1-2") from shoe and balances independently From Standing Position, Turn to Look Behind Over each Shoulder: Turn sideways only but maintains balance Turn 360 Degrees: Needs assistance while turning Standing Unsupported, Alternately Place Feet on Step/Stool: Needs assistance to keep from falling or unable to try Standing Unsupported, One Foot in Front: Able to take small step independently and hold 30 seconds Standing on One Leg: Unable to try or needs assist to prevent fall Total Score: 27 Static Sitting Balance Static Sitting - Balance Support: Feet supported Static Sitting - Level of Assistance: 7: Independent Dynamic Sitting Balance Dynamic Sitting - Balance Support: Right upper extremity supported;Left upper extremity  supported;Feet supported Dynamic Sitting - Level of Assistance: 6: Modified independent (Device/Increase time) Static Standing Balance Static Standing - Balance Support: No upper extremity supported;During functional activity Static Standing - Level  of Assistance: 7: Independent Dynamic Standing Balance Dynamic Standing - Balance Support: Bilateral upper extremity supported;During functional activity Dynamic Standing - Level of Assistance: 6: Modified independent (Device/Increase time) Extremity Assessment  RLE Assessment RLE Assessment: Exceptions to [facility] Passive Range of Motion (PROM) Comments: DF to neutral and 5 deg past with prolonged stretch, lacking hip internal rotation, limited hip flexion due to hip flexor tightness RLE Strength RLE Overall Strength: Deficits Right Hip Flexion: 2/5 Right Hip ABduction: 1/5 Right Hip ADduction: 5/5 Right Knee Flexion: 2-/5 Right Knee Extension: 5/5 Right Ankle Dorsiflexion: 1/5 Right Ankle Plantar Flexion: 4/5 RLE Tone RLE Tone: Moderate;Hypertonic;Modified Ashworth Body Part - Modified Ashworth Scale: Gastrocnemius;Soleus;Quadriceps QUADRICEPS - Modified Ashworth Scale for Grading Hypertonia RLE: Slight increase in muscle tone, manifested by a catch, followed by minimal resistance throughout the remainder (less than half) of the ROM GASTROCNEMIUS - Modified Ashworth Scale for Grading Hypertonia RLE: More marked increase in muscle tone through most of the ROM, but affected part(s) easily moved SOLEUS - Modified Ashworth Scale for Grading Hypertonia RLE: More marked increase in muscle tone through most of the ROM, but affected part(s) easily moved RLE Tone Comments: hypertonicity in extension pattern, able to move out of synergy with increased time and effort LLE Assessment LLE Assessment: Exceptions to [facility] Passive Range of Motion (PROM) Comments: DF to neutral and 5 deg past with prolonged stretch Active Range of Motion (AROM) Comments: WFL  except DF LLE Strength LLE Overall Strength: Deficits Left Hip Flexion: 4/5 Left Hip ABduction: 4/5 Left Hip ADduction: 5/5 Left Knee Flexion: 5/5 Left Knee Extension: 5/5 Left Ankle Dorsiflexion: 1/5 Left Ankle Plantar Flexion: 4/5 LLE Tone LLE Tone: Mild;Hypertonic;Modified Ashworth Body Part - Modified Ashworth Scale: Quadriceps;Gastrocnemius;Soleus Quadriceps - Modified Ashworth Scale for Grading Hypertonia LLE: Slight increase in muscle tone, manifested by a catch and release or by minimal resistance at the end of the range of motion when the affected part(s) is moved in flexion or extension Gastrocnemius - Modified Ashworth Scale for Grading Hypertonia LLE: Slight increase in muscle tone, manifested by a catch and release or by minimal resistance at the end of the range of motion when the affected part(s) is moved in flexion or extension Soleus - Modified Ashworth Scale for Grading Hypertonia LLE: Slight increase in muscle tone, manifested by a catch and release or by minimal resistance at the end of the range of motion when the affected part(s) is moved in flexion or extension Modified Ashworth Scale for Grading Hypertonia LLE: Slight increase in muscle tone, manifested by a catch and release or by minimal resistance at the end of the range of motion when the affected part(s) is moved in flexion or extension LLE Tone Comments: hypertonicity in extension pattern, able to move out of synergy volitionally    Gayanne Prescott L Ahnyla Mendel PT, DPT  07/17/2021, 10:06 AM

## 2021-07-17 NOTE — Progress Notes (Signed)
Physical Therapy Session Note  Patient Details  Name: James Olsen MRN: [id number] Date of Birth: 1946/04/10  Today's Date: 07/17/2021 PT Individual Time: 0916-1000 PT Individual Time Calculation (min): 44 min   Short Term Goals: Week 3:  PT Short Term Goal 1 (Week 3): STG=LTG due to ELOS.  Skilled Therapeutic Interventions/Progress Updates: Pt presents supine in bed and agreeable to therapy.  Pt transfers sup to sit w/ mod I, increased time.  Pt transferred sit to stand multiple trials from bed, car simulator and w/c w/ supervision.  Pt wheeled to small gym for time conservation.  Pt amb w/ RW and Supervision x 20' to car.  Pt able to transfer supervision in and out of car.  Pt negotiated 8 steps w/ B rails and supervision step-to gait pattern w/ occasional verbal cues for maintaining appropriate BOS.  Pt negotiated (1) 5" step w/ RW and CGA after demo.  Pt states 3 STE home and then a porch and then 1 STE home.  Pt returned to room and remained in w/c w/ chair alarm on and all needs in reach.     Therapy Documentation Precautions:  Precautions Precautions: Fall Precaution Comments: BLE extension hypertonicity Restrictions Weight Bearing Restrictions: No General:   Vital Signs:   Pain:0/10 Pain Assessment Pain Scale: 0-10 Pain Score: 0-No pain     Therapy/Group: Individual Therapy  Ladoris Gene 07/17/2021, 10:46 AM

## 2021-07-17 NOTE — Progress Notes (Signed)
Late entry Patient self I/O cath and educated on process and procedure, tolerated well ,states understanding and support provided

## 2021-07-17 NOTE — Plan of Care (Signed)
SMS OTR/L 

## 2021-07-17 NOTE — Progress Notes (Signed)
Occupational Therapy Session Note  Patient Details  Name: James Olsen MRN: [id number] Date of Birth: 10-14-1946  Today's Date: 07/17/2021 OT Individual Time: 0700-0800 OT Individual Time Calculation (min): 60 min     Skilled Therapeutic Interventions/Progress Updates:     Pt received in bed with 4 out of 10 pain in back. heat provided for pain relief at end of session  ADL:  Pt completes bathing with supervision sit to stand at sink to freshen up as pt had shower yesterday Pt completes UB dressing with set up Pt completes LB dressing with S at sit to stand level  Pt completes footwear with S to don B non skid socks Pt completes toileting with supervision with VC for holding onto pants at hips during sit to stand. Education on pants clips for at home to improve independence with toileting Pt completes toileting transfer with supervision using RW and non skid socks on feet Pt completes shower/Tub transfer with CGA for stepping posteriorly into shower over 4 inch threshold  Pt left at end of session in bed with exit alarm on, call light in reach and all needs met   Therapy Documentation Precautions:  Precautions Precautions: Fall Precaution Comments: BLE extension hypertonicity Restrictions Weight Bearing Restrictions: No General:   Vital Signs: Therapy Vitals Temp: 98 F (36.7 C) Pulse Rate: 66 Resp: 18 BP: 102/67 Patient Position (if appropriate): Lying Oxygen Therapy SpO2: 100 % O2 Device: Room Air Pain: Pain Assessment Pain Score: 0-No pain ADL: ADL Eating: Independent Where Assessed-Eating: Wheelchair Grooming: Modified independent Where Assessed-Grooming: Sitting at sink Upper Body Bathing: Modified independent Where Assessed-Upper Body Bathing: Shower Lower Body Bathing: Contact guard Where Assessed-Lower Body Bathing: Shower Upper Body Dressing: Supervision/safety Where Assessed-Upper Body Dressing: Sitting at sink Lower Body Dressing:  Supervision/safety Where Assessed-Lower Body Dressing: Standing at sink Toileting: Supervision/safety Where Assessed-Toileting: Glass blower/designer: Close supervision Armed forces technical officer Method: Counselling psychologist: Geophysical data processor: Close supervision Social research officer, government Method: Heritage manager: Civil engineer, contracting with back Vision Baseline Vision/History: Wears glasses Wears Glasses: At all times Patient Visual Report: No change from baseline Vision Assessment?: No apparent visual deficits Perception  Perception: Within Functional Limits Praxis Praxis: Intact Praxis Impairment Details: Motor planning Exercises:   Other Treatments:     Therapy/Group: Individual Therapy  Tonny Branch 07/17/2021, 6:44 AM

## 2021-07-17 NOTE — Discharge Summary (Signed)
Physician Discharge Summary  Patient ID: DVANTE ROGIER MRN: [id number] DOB/AGE: Mar 21, 1946 75 y.o.  Admit date: 06/26/2021 Discharge date: 07/18/2021  Discharge Diagnoses:  Principal Problem:   Atypical meningioma of brain [facility]) Active Problems:   ELEVATION, TRANSAMINASE/LDH LEVELS   Acute blood loss anemia   Seizure prophylaxis   S/P resection of meningioma   Urinary retention   Discharged Condition: Stable   Significant Diagnostic Studies: DG Abd 1 View  Result Date: 06/26/2021 CLINICAL DATA:  Abdominal pain. No bowel movement since surgery 5 days ago. EXAM: ABDOMEN - 1 VIEW COMPARISON:  None. FINDINGS: Gas and stool throughout the colon with some gas-filled small bowel. No small or large bowel distention. No radiopaque stones. Surgical clips in the right upper quadrant. Degenerative changes in the spine and hips. IMPRESSION: Nonobstructive bowel gas pattern with stool-filled colon and nondilated gas-filled small bowel. Changes likely constipation and/or ileus. Electronically Signed   By: Lucienne Capers M.D.   On: 06/26/2021 19:43    Labs:  Basic Metabolic Panel: BMP Latest Ref Rng & Units 07/15/2021 07/08/2021 07/01/2021  Glucose 70 - 99 mg/dL 118(H) 162(H) 172(H)  BUN 8 - 23 mg/dL '18 21 15  '$ Creatinine 0.61 - 1.24 mg/dL 0.85 0.94 0.67  Sodium 135 - 145 mmol/L 132(L) 133(L) 133(L)  Potassium 3.5 - 5.1 mmol/L 3.7 3.9 4.1  Chloride 98 - 111 mmol/L 97(L) 100 99  CO2 22 - 32 mmol/L '27 25 27  '$ Calcium 8.9 - 10.3 mg/dL 9.0 8.9 8.4(L)     CBC: CBC Latest Ref Rng & Units 07/15/2021 07/08/2021 07/01/2021  WBC 4.0 - 10.5 K/uL 5.4 6.3 8.7  Hemoglobin 13.0 - 17.0 g/dL 13.0 12.9(L) 11.9(L)  Hematocrit 39.0 - 52.0 % 40.2 39.3 36.4(L)  Platelets 150 - 400 K/uL 217 230 201     CBG: No results for input(s): GLUCAP in the last 168 hours.  Brief HPI:   ZAMIR DUDZIK is a 75 y.o. 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  He was admitted on 06/21/2021 for resection of tumor with sacrifice of SSS and resultant RUE and BLE weakness postop.  He was noted to have issues with spasticity therefore baclofen was added and he was started on slow steroid taper.  He was showing improvement in BLE strength but continued to be limited by significant extensor tone with standing attempts.  CIR was recommended due to functional decline.   Hospital Course: NICOLAE DANN was admitted to rehab 06/26/2021 for inpatient therapies to consist of PT and OT at least three hours five days a week. Past admission physiatrist, therapy team and rehab RN have worked together to provide customized collaborative inpatient rehab. He reported significant constipation at admission and KUB done showing large stool burden with question of ileus.  He was treated with enemas and bowel program and bowel program was augmented with good results.  His blood pressures were monitored on TID basis and have been stable.  He has been seizure-free on current dose Keppra.  Steroids have been slowly weaned off and serial check of electrolytes showed improvement in fasting blood sugars and mild hyponatremia to be stable.  Follow-up CBC shows acute blood loss anemia and thrombocytopenia have resolved.   His cranial incision is clean, dry and intact and is healing well without any signs or symptoms of infection.  Baclofen was  increased to QID but he developed dizziness with hypotension therefore dose was decreased down to TID.  Range of motion is also been used during therapy to help manage spasticity.  He was noted to have problems with urinary retention and Flomax as well as Urecholine was added and titrated upwards.  UA showed pyuria however urine culture was negative for infection therefore empiric Keflex was discontinued. H his voiding function has  improved however he continues to have intermittent elevated PVRs due to neurogenic bladder and has been educated on performing in and out catheterization at least twice a day to keep bladder fully decompressed.  He has made steady gains during his stay and he currently requires supervision.  He will continue to receive follow-up home health PT, OT, RN and aide by   Rehab course: During patient's stay in rehab weekly team conferences were held to monitor patient's progress, set goals and discuss barriers to discharge. At admission, patient required mod assist with mobility and with basic self care tasks.  He has had improvement in activity tolerance, balance, postural control as well as ability to compensate for deficits.  He is able to complete ADL tasks with supervision. He requires supervision for transfers and to ambulate 450 feet with rolling walker.  Family education was completed with wife.   Discharge disposition: 01-Home or Self Care  Diet: Regular  Special Instructions: No strenuous activity till cleared by MD. No driving till seizure-free for 6 months. Discharge Instructions     Ambulatory referral to Hematology / Oncology   Complete by: As directed    Needs follow up appointment with Dr. Mickeal Skinner   Ambulatory referral to Physical Medicine Rehab   Complete by: As directed    3-4 weeks follow up appointment      Allergies as of 07/18/2021   No Known Allergies      Medication List     STOP taking these medications    Red Yeast Rice Extract 600 MG Caps       TAKE these medications    acetaminophen 325 MG tablet Commonly known as: TYLENOL Take 1-2 tablets (325-650 mg total) by mouth every 4 (four) hours as needed for mild pain.   baclofen 10 MG tablet Commonly known as: LIORESAL Take 1 tablet (10 mg total) by mouth 3 (three) times daily.   bethanechol 50 MG tablet Commonly known as: URECHOLINE Take 1 tablet (50 mg total) by mouth 4 (four) times daily.    levETIRAcetam 500 MG tablet Commonly known as: Keppra Take 1 tablet (500 mg total) by mouth 2 (two) times daily.   lidocaine 2 % jelly Commonly known as: XYLOCAINE Place 1 application into the urethra as needed (for I/O caths).   pantoprazole 40 MG tablet Commonly known as: PROTONIX Take 1 tablet (40 mg total) by mouth at bedtime.   polyethylene glycol 17 g packet Commonly known as: MIRALAX / GLYCOLAX Take 17 g by mouth daily.   PreserVision AREDS 2 Caps Take 1 capsule by mouth 2 (two) times daily.   senna-docusate 8.6-50 MG tablet Commonly known as: Senokot-S Take 1 tablet by mouth at bedtime.   tamsulosin 0.4 MG Caps capsule Commonly known as: FLOMAX Take 1 capsule (0.4 mg total) by mouth daily after supper.   traZODone 50 MG tablet Commonly known as: DESYREL Take 0.5-1 tablets (25-50 mg total) by mouth at bedtime as needed for sleep.        Follow-up Information     Meredith Staggers, MD Follow  up.   Specialty: Physical Medicine and Rehabilitation Why: office will call you with follow up appointment Contact information: [address] [place] [address] [phone]         [name], MD. Call.   Specialty: Family Medicine Why: for post hospital follow up Contact information: [address] [place] [address] [phone]         [name], MD Follow up on 08/05/2021.   Specialty: Neurosurgery Why: post op appointment at 10:30 am Contact information: [address] [place] [address] [phone]         [name], MD Follow up.   Specialties: Psychiatry, Neurology, Oncology Why: Office will contact you with follow up appointment---call them on Monday if they have not heard from them. Contact information: [place] [address] [phone]         Festus Aloe, MD Follow up on 07/25/2021.   Specialty: Urology Why: be there at 2:30 pm for 2:45  appt Contact information: [place]  [address] [phone]                 Signed: Bary Leriche 07/18/2021, 5:18 PM

## 2021-07-18 NOTE — Progress Notes (Signed)
Patient ID: James Olsen, male   DOB: May 21, 1946, 75 y.o.   MRN: [id number]  SW gave pt handicap placard. Pt aware representative from [employer] will f/u about catheters.   Loralee Pacas, MSW, [place] Office: [phone] Cell: [phone] Fax: [phone]

## 2021-07-18 NOTE — Progress Notes (Signed)
PROGRESS NOTE   Subjective/Complaints: Pt had a good night. Excited about dc home. Only needed to be cathed once yesterday  ROS: Patient denies fever, rash, sore throat, blurred vision, nausea, vomiting, diarrhea, cough, shortness of breath or chest pain, joint or back pain, headache, or mood change.    Objective:   No results found. No results for input(s): WBC, HGB, HCT, PLT in the last 72 hours.     No results for input(s): NA, K, CL, CO2, GLUCOSE, BUN, CREATININE, CALCIUM in the last 72 hours.      Intake/Output Summary (Last 24 hours) at 07/18/2021 0954 Last data filed at 07/18/2021 0835 Gross per 24 hour  Intake 714 ml  Output 1500 ml  Net -786 ml        Physical Exam: Vital Signs Blood pressure 98/64, pulse 65, temperature 98.2 F (36.8 C), resp. rate 17, height '5\' 9"'$  (1.753 m), weight 80.3 kg, SpO2 98 %.    Constitutional: No distress . Vital signs reviewed. HEENT: EOMI, oral membranes moist Neck: supple Cardiovascular: RRR without murmur. No JVD    Respiratory/Chest: CTA Bilaterally without wheezes or rales. Normal effort    GI/Abdomen: BS +, non-tender, non-distended Ext: no clubbing, cyanosis, or edema Psych: pleasant and cooperative  Skin: No evidence of breakdown, no evidence of rash. Scalp wound almost healed. Musculoskeletal: Full range of motion in all 4 extremities. No joint swelling Neuro: Pt is cognitively appropriate with normal insight, memory, and awareness. Continued word finding deficits, processing delays.  Cranial nerves 2-12 are intact. Sensory exam is normal.extensor/flexor tone bilateral LE's (MAS 1-1+)  BUE motor 5/5. RLE 3+/5 prox to 1-2/5 distally, LLE 3-4/5 prox to  2/5--unchanged 7/24 Musculoskeletal: No pain with ROM. R heel cord stretched easily to neutral   Assessment/Plan: 1. Functional deficits which require 3+ hours per day of interdisciplinary therapy in a  comprehensive inpatient rehab setting. Physiatrist is providing close team supervision and 24 hour management of active medical problems listed below. Physiatrist and rehab team continue to assess barriers to discharge/monitor patient progress toward functional and medical goals  Care Tool:  Bathing    Body parts bathed by patient: Face, Abdomen, Chest, Left arm, Right arm, Front perineal area, Buttocks, Right upper leg, Left upper leg, Right lower leg, Left lower leg   Body parts bathed by helper: Buttocks (back) Body parts n/a: Left lower leg   Bathing assist Assist Level: Supervision/Verbal cueing (simulated at sink)     Upper Body Dressing/Undressing Upper body dressing   What is the patient wearing?: Pull over shirt    Upper body assist Assist Level: Set up assist    Lower Body Dressing/Undressing Lower body dressing      What is the patient wearing?: Pants     Lower body assist Assist for lower body dressing: Supervision/Verbal cueing     Toileting Toileting    Toileting assist Assist for toileting: Supervision/Verbal cueing     Transfers Chair/bed transfer  Transfers assist     Chair/bed transfer assist level: Independent with assistive device Chair/bed transfer assistive device: Programmer, multimedia   Ambulation assist      Assist level: Supervision/Verbal cueing Assistive  device: Walker-rolling Max distance: 459 ft   Walk 10 feet activity   Assist  Walk 10 feet activity did not occur: Safety/medical concerns (limited by decreased balance and extensor hypertonicity)  Assist level: Supervision/Verbal cueing Assistive device: Walker-rolling   Walk 50 feet activity   Assist Walk 50 feet with 2 turns activity did not occur: Safety/medical concerns  Assist level: Supervision/Verbal cueing Assistive device: Walker-rolling    Walk 150 feet activity   Assist Walk 150 feet activity did not occur: Safety/medical  concerns  Assist level: Supervision/Verbal cueing Assistive device: Walker-rolling    Walk 10 feet on uneven surface  activity   Assist Walk 10 feet on uneven surfaces activity did not occur: Safety/medical concerns   Assist level: Contact Guard/Touching assist Assistive device: Aeronautical engineer Will patient use wheelchair at discharge?: No Type of Wheelchair: Manual Wheelchair activity did not occur: N/A  Wheelchair assist level: Supervision/Verbal cueing Max wheelchair distance: 75    Wheelchair 50 feet with 2 turns activity    Assist    Wheelchair 50 feet with 2 turns activity did not occur: N/A   Assist Level: Supervision/Verbal cueing   Wheelchair 150 feet activity     Assist  Wheelchair 150 feet activity did not occur: N/A   Assist Level: Supervision/Verbal cueing   Blood pressure 98/64, pulse 65, temperature 98.2 F (36.8 C), resp. rate 17, height '5\' 9"'$  (1.753 m), weight 80.3 kg, SpO2 98 %.  Medical Problem List and Plan: 1.  Decline in ADL and Mobility  secondary to Atypical meningioma with bilateral craniotomy/tumor resection 06/21/21  -steroids wean to off             -patient [place]             -dc home today, has R AFO  -[facility] f/u in 4-5 weeks  -needs f/u with [facility], Dr. Mickeal Skinner 2.  Impaired mobility -DVT/anticoagulation:  Pharmaceutical: Continue Heparin 5,000U q8H             -antiplatelet therapy: 3. Pain Management: N/A 4. Mood: will speak to patient re: his mood.              -antipsychotic agents: N/A 5. Neuropsych: This patient is capable of making decisions on his own behalf. 6. Skin/Wound Care: Removed staples   7. Fluids/Electrolytes/Nutrition: encourage PO  -solid PO 8. Spasticity:   baclofen   -rom, splinting. improving   baclofen reduced to '10mg'$  tid ---continue for now 9. Thrombocytopenia: Monitor for signs of bleeding.  -7/25 holding at 217k 10 Hyperglycemia: Likely due to steroids. -- Hgb A1C  6.0. 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 Acute blood loss anemia: Monitor for signs of bleeding with drop in plts.             --hgb up to 13k 7/25 13. Chronic R-knee pain: S/P steroid injection 03/22 14. H/o Stroke with residual: 15. GERD?: protonix 16. Constipation/neurogenic bowel:  -improved  7/25- stools regular and formed on qd miralax, hs senna-s 17. Neurogenic bladder/urine retention:  Dc home on I/O cath 1-2 x per day -flomax 0.4 and urecholine '50mg'$  qid  -urology referral---may not ulimately need   LOS: 22 days A FACE TO FACE EVALUATION WAS PERFORMED  Meredith Staggers 07/18/2021, 9:54 AM

## 2021-07-18 NOTE — Progress Notes (Signed)
Patient verbalized understanding of self catheterization prior to discharge. All questions answered.

## 2021-07-18 NOTE — Progress Notes (Signed)
Late entry 07/17/2021 @ 20000 Upon arriving to patient room he was very anxious, verbally shouting at assigned NT, stating he needed to "be catheterized now", after speaking calmly and directly to patient , this writer was able to calm and provide reassurance so that this task be provided .He was redirected and reassured by providing  him with the equipment he needed on self cath himself .I/O craterization was successfully, continuous support provided.

## 2021-07-18 NOTE — Progress Notes (Signed)
Inpatient Rehabilitation Care Coordinator Discharge Note  The overall goal for the admission was met for:   Discharge location: Yes. D/c to home with supervision from wife.   Length of Stay: Yes. 21 days.  Discharge activity level: Yes. Supervision.   Home/community participation: Yes. Limited.  Services provided included: MD, RD, PT, OT, SLP, RN, CM, TR, Pharmacy, Neuropsych, and SW  Financial Services: Medicare and Private Insurance: [employer] offered to/list presented to:Yes  Follow-up services arranged: Home Health: [facility] for HHPT/OT/SN (catheter edu)/aide, DME: [employer] [facility] and RW, and Patient/Family request agency HH: [facility]), DME: N/A  Comments (or additional information):  Patient/Family verbalized understanding of follow-up arrangements: Yes  Individual responsible for coordination of the follow-up plan: contact pt [phone]  Confirmed correct DME delivered: Rana Snare 07/18/2021    Rana Snare

## 2021-07-26 ENCOUNTER — Encounter: Payer: Self-pay | Admitting: Internal Medicine

## 2021-07-29 ENCOUNTER — Telehealth: Payer: Self-pay | Admitting: Internal Medicine

## 2021-07-29 NOTE — Telephone Encounter (Signed)
Scheduled appt per 8/8 sch msg. Pt aware.  

## 2021-08-01 ENCOUNTER — Other Ambulatory Visit: Payer: Self-pay | Admitting: Physical Medicine and Rehabilitation

## 2021-08-05 ENCOUNTER — Other Ambulatory Visit: Payer: Self-pay | Admitting: Neurosurgery

## 2021-08-05 DIAGNOSIS — D329 Benign neoplasm of meninges, unspecified: Secondary | ICD-10-CM

## 2021-08-06 ENCOUNTER — Inpatient Hospital Stay: Payer: Medicare Other | Attending: Internal Medicine | Admitting: Internal Medicine

## 2021-08-06 ENCOUNTER — Other Ambulatory Visit: Payer: Self-pay | Admitting: Internal Medicine

## 2021-08-06 DIAGNOSIS — D420 Neoplasm of uncertain behavior of cerebral meninges: Secondary | ICD-10-CM | POA: Diagnosis not present

## 2021-08-06 DIAGNOSIS — R569 Unspecified convulsions: Secondary | ICD-10-CM | POA: Diagnosis not present

## 2021-08-06 MED ORDER — BACLOFEN 10 MG PO TABS: 10.0000 mg | ORAL_TABLET | Freq: Three times a day (TID) | ORAL | 1 refills | Status: DC

## 2021-08-06 NOTE — Progress Notes (Signed)
I connected with James Olsen on 08/06/21 at 10:30 AM EDT by telephone visit and verified that I am speaking with the correct person using two identifiers.  I discussed the limitations, risks, security and privacy concerns of performing an evaluation and management service by telemedicine and the availability of in-person appointments. I also discussed with the patient that there may be a patient responsible charge related to this service. The patient expressed understanding and agreed to proceed.  Other persons participating in the visit and their role in the encounter:  n/a  Patient's location:  Home  Provider's location:  Office  Chief Complaint:  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[place])  Focal seizures ([place])  History of Present Ilness: James Olsen describes modest improvement in motor function since surgery on 06/21/21.  He is currently ambulating around the home in a walker.  He does describes some issues with word/language expression, sometimes getting words or sounds jumbled.  His hands and arms seem to be at full strength.  Spasticity and cramping are severe at night-time, baclofen helps but not as much during middle of night.  Denies headaches, seizures.  Does have upcoming MRI scan, not date yet. Observations: Modest expressive dysphasia Assessment and Plan: 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[place])  Focal seizures ([place])  Clinically improving motor deficits post-operatively.    We are ok with him taking an additional '10mg'$  baclofen overnight if spasms are severe.   He will obtain MRI brain, we will review in brain/spine tumor board.    Follow Up Instructions: Follow up after MRI study, tumor board.  I discussed the assessment and treatment plan with the patient.  The patient was provided an opportunity to ask questions and all were answered.  The patient agreed with the plan and demonstrated understanding of the instructions.    The patient was advised to call back or seek an in-person  evaluation if the symptoms worsen or if the condition fails to improve as anticipated.  I provided 5-10 minutes of non-face-to-face time during this enocunter.  [name], MD   I provided 15 minutes of non face-to-face telephone visit time during this encounter, and > 50% was spent counseling as documented under my assessment & plan.

## 2021-08-09 ENCOUNTER — Other Ambulatory Visit: Payer: Medicare Other

## 2021-08-12 ENCOUNTER — Other Ambulatory Visit: Payer: Self-pay

## 2021-08-12 ENCOUNTER — Encounter (INDEPENDENT_AMBULATORY_CARE_PROVIDER_SITE_OTHER): Payer: Medicare Other | Admitting: Ophthalmology

## 2021-08-12 DIAGNOSIS — H353132 Nonexudative age-related macular degeneration, bilateral, intermediate dry stage: Secondary | ICD-10-CM

## 2021-08-12 DIAGNOSIS — H43813 Vitreous degeneration, bilateral: Secondary | ICD-10-CM | POA: Diagnosis not present

## 2021-08-12 DIAGNOSIS — H2513 Age-related nuclear cataract, bilateral: Secondary | ICD-10-CM | POA: Diagnosis not present

## 2021-08-18 ENCOUNTER — Ambulatory Visit
Admission: RE | Admit: 2021-08-18 | Discharge: 2021-08-18 | Disposition: A | Discharge status: Home / Self Care | Payer: Medicare Other | Source: Ambulatory Visit | Attending: Neurosurgery | Admitting: Neurosurgery

## 2021-08-18 DIAGNOSIS — D329 Benign neoplasm of meninges, unspecified: Secondary | ICD-10-CM

## 2021-08-18 MED ORDER — GADOBENATE DIMEGLUMINE 529 MG/ML IV SOLN
20.0000 mL | Freq: Once | INTRAVENOUS | Status: AC | PRN
  Administered 2021-08-18: 20 mL via INTRAVENOUS

## 2021-08-20 ENCOUNTER — Other Ambulatory Visit: Payer: Self-pay | Admitting: Radiation Therapy

## 2021-08-22 ENCOUNTER — Inpatient Hospital Stay: Payer: Medicare Other | Attending: Internal Medicine | Admitting: Internal Medicine

## 2021-08-22 DIAGNOSIS — R569 Unspecified convulsions: Secondary | ICD-10-CM | POA: Diagnosis not present

## 2021-08-22 DIAGNOSIS — D420 Neoplasm of uncertain behavior of cerebral meninges: Secondary | ICD-10-CM | POA: Diagnosis not present

## 2021-08-22 NOTE — Progress Notes (Signed)
I connected with James Olsen on 08/22/21 at  2:30 PM EDT by telephone visit and verified that I am speaking with the correct person using two identifiers.  I discussed the limitations, risks, security and privacy concerns of performing an evaluation and management service by telemedicine and the availability of in-person appointments. I also discussed with the patient that there may be a patient responsible charge related to this service. The patient expressed understanding and agreed to proceed.  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  He is still ambulating around the home in a walker.  He continues to describe some issues with word/language expression, sometimes getting words or sounds jumbled.  Denies headaches, seizures.  Recently completed MRI brain.  Observations: Modest expressive dysphasia  Imaging:  [facility] Clinician Interpretation: I have personally reviewed the CNS images as listed.  My interpretation, in the context of the patient's clinical presentation, is  post-operative changes  MR BRAIN W WO CONTRAST  Result Date: 08/18/2021 CLINICAL DATA:  Follow-up meningioma resection. EXAM: MRI HEAD WITHOUT AND WITH CONTRAST TECHNIQUE: Multiplanar, multiecho pulse sequences of the brain and surrounding structures were obtained without and with intravenous contrast. CONTRAST:  77m MULTIHANCE GADOBENATE DIMEGLUMINE 529 MG/ML IV SOLN COMPARISON:  Head CT 06/22/2021 and MRI 05/03/2021 FINDINGS: Brain: Sequelae of vertex meningioma resection are again identified with repeat resection having occurred since the prior MRI. There is encephalomalacia in the left greater than right posterior frontal lobes at the  vertex with regional blood products. There is regional dural thickening including a more nodular area of extra-axial soft tissue/dural enhancement at the posterior aspect of the craniotomy which measures 2.2 x 2.1 cm and is indeterminate for residual tumor (series 17, image 142). There is also a similar region of dural enhancement near the anterior aspect of the craniotomy (series 17, image 130) which appears more sheet like and less nodular on coronal reformats and is located slightly more anterior to the tumor on the preoperative MRI more suggestive of postoperative dural thickening rather than residual tumor in this location. There is mild residual edema in the posterior left frontal lobe which has decreased from the prior MRI. A small extra-axial fluid collection is noted subjacent to the craniotomy without significant mass effect. A meningioma more inferiorly and laterally over the left frontoparietal convexity has enlarged from the prior MRI and now measures 11 mm (series 17, image 104, previously 7 mm), however there is no significant mass effect or brain edema. No acute infarct or midline shift is present. Patchy T2 hyperintensities elsewhere in the cerebral white matter bilaterally are unchanged from the prior MRI and are nonspecific but compatible with moderate chronic small vessel ischemic disease. There is mild cerebral atrophy. Vascular: Major intracranial arterial flow voids are preserved. Ligation of the superior sagittal sinus at the site of the meningioma resection. Skull and upper cervical spine: Vertex craniotomy. Sinuses/Orbits: Unremarkable orbits. Small mucous retention cyst in the left maxillary sinus. Clear mastoid air cells. Other: None. IMPRESSION: 1. Postoperative changes from vertex meningioma resection with regional dural thickening as described above including a mildly nodular area at the posterior aspect of the resection site which is indeterminate for residual tumor. Attention on  follow-up. Decreased left frontal lobe  edema. 2. Increased size of the separate meningioma more inferiorly over the left lateral cerebral convexity, now 11 mm. 3. Electronically Signed   By: Logan Bores M.D.   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    Follow Up Instructions: Follow up after next scheduled MRI brain in November.  I discussed the assessment and treatment plan with the patient.  The patient was provided an opportunity to ask questions and all were answered.  The patient agreed with the plan and demonstrated understanding of the instructions.    The patient was advised to call back or seek an in-person evaluation if the symptoms worsen or if the condition fails to improve as anticipated.  I provided 5-10 minutes of non-face-to-face time during this enocunter.  [name], MD   I provided 15 minutes of non face-to-face telephone visit time during this encounter, and > 50% was spent counseling as documented under my assessment & plan.

## 2021-08-23 ENCOUNTER — Telehealth: Payer: Self-pay | Admitting: Internal Medicine

## 2021-08-23 NOTE — Telephone Encounter (Signed)
Scheduled appt per 9/1 los. Pt aware.  

## 2021-09-02 ENCOUNTER — Inpatient Hospital Stay: Payer: Medicare Other

## 2021-09-11 ENCOUNTER — Encounter: Payer: Medicare Other | Attending: Physical Medicine & Rehabilitation | Admitting: Physical Medicine & Rehabilitation

## 2021-09-11 ENCOUNTER — Other Ambulatory Visit: Payer: Self-pay

## 2021-09-11 ENCOUNTER — Encounter: Payer: Self-pay | Admitting: Physical Medicine & Rehabilitation

## 2021-09-11 VITALS — BP 112/71 | HR 71 | Temp 98.2°F | Ht 69.0 in | Wt 181.0 lb

## 2021-09-11 DIAGNOSIS — R252 Cramp and spasm: Secondary | ICD-10-CM | POA: Insufficient documentation

## 2021-09-11 DIAGNOSIS — I69351 Hemiplegia and hemiparesis following cerebral infarction affecting right dominant side: Secondary | ICD-10-CM | POA: Diagnosis present

## 2021-09-11 DIAGNOSIS — D420 Neoplasm of uncertain behavior of cerebral meninges: Secondary | ICD-10-CM | POA: Insufficient documentation

## 2021-09-11 NOTE — Progress Notes (Signed)
Subjective:    Patient ID: James Olsen, male    DOB: [birth year], 75 y.o.   MRN: [id number]  HPI James Olsen is here in follow-up of his atypical meningioma status post bilateral craniotomy with tumor resection and subsequent rehab stay.  He was discharged at the end of July.  He has been followed by Dr. Mickeal Skinner at the cancer center.  He has another MRI set up for November.  He has been clinically and radiographically stable.  He received home health PT and has noticed increased strength at his knee. His right foot has been slow to recover. He has 4 visits left. 'He is using an AFO which is fitting appropriately. He denies any falls  From a bladder stand point, he hasn't cathed for 2 weeks. He was set back a bit d/t a bladder infection. His PCP decreased urecholine to bid and changed him to rapaflo. He has been trying to drink more and that causes him to wake up at night to empty  He is still having spasms at night. He is taking baclofen 10mg  TID with last dose at bedtime. He often wakes up from sleep with calf and foot cramps. ''  His bowels are moving regularly.   Pain Inventory Average Pain 0 Pain Right Now 0 My pain is  no pain  LOCATION OF PAIN     BOWEL Number of stools per week: 14 plus Oral laxative use No  Type of laxative none Enema or suppository use No  History of colostomy No  Incontinent No   BLADDER Normal In and out cath, frequency Not in 2 weeks Able to self cath Yes    Mobility walk with assistance use a walker ability to climb steps?  yes Do you have any goals in this area?  yes  Function retired  Neuro/Psych weakness numbness trouble walking spasms  Prior Studies Any changes since last visit?  yes CT/MRI  Physicians involved in your care Any changes since last visit?  yes, Dr. Harold Barban   Family History  Problem Relation Age of Onset   Diabetes Mother    Skin cancer Brother    Congenital heart disease Brother    Social  History   Socioeconomic History   Marital status: Married    Spouse name: Not on file   Number of children: Not on file   Years of education: Not on file   Highest education level: Not on file  Occupational History   Not on file  Tobacco Use   Smoking status: Never   Smokeless tobacco: Never  Vaping Use   Vaping Use: Never used  Substance and Sexual Activity   Alcohol use: Yes    Comment: occasionally   Drug use: No   Sexual activity: Not Currently  Other Topics Concern   Not on file  Social History Narrative   Not on file   Social Determinants of Health   Financial Resource Strain: Not on file  Food Insecurity: Not on file  Transportation Needs: Not on file  Physical Activity: Not on file  Stress: Not on file  Social Connections: Not on file   Past Surgical History:  Procedure Laterality Date   APPLICATION OF CRANIAL NAVIGATION N/A 06/21/2021   Procedure: Ward;  Surgeon: [name], MD;  Location: [place];  Service: Neurosurgery;  Laterality: N/A;   BRAIN SURGERY     CHOLECYSTECTOMY  2008   lap choli   COLONOSCOPY     CRANIOPLASTY N/A  10/29/2018   Procedure: CRANIOPLASTY;  Surgeon: [name], MD;  Location: [place];  Service: Neurosurgery;  Laterality: N/A;  CRANIOPLASTY   CRANIOTOMY N/A 08/18/2018   Procedure: CRANIOTOMY TUMOR EXCISION;  Surgeon: [name], MD;  Location: [place];  Service: Neurosurgery;  Laterality: N/A;  CRANIOTOMY TUMOR EXCISION   CRANIOTOMY Bilateral 06/21/2021   Procedure: Bilateral Craniotomy for tumor resection;  Surgeon: [name], MD;  Location: [place];  Service: Neurosurgery;  Laterality: Bilateral;   DIAGNOSTIC LAPAROSCOPY     EYE SURGERY     laser surgery to relieve pressure   NAILBED REPAIR Left 05/04/2014   Procedure: LEFT INDEX NAIL ABLATION V-Y FLAP COVERAGE;  Surgeon: [name]., MD;  Location: [place];  Service: Orthopedics;  Laterality: Left;  index   PILONIDAL CYST  EXCISION     x2   TONSILLECTOMY     VASECTOMY     Past Medical History:  Diagnosis Date   Arthritis    GERD (gastroesophageal reflux disease)    Glaucoma    pt denies.    History of blood transfusion     5 units after previous surgery   History of radiation therapy 12/09/18- 01/24/2019   Left sagittal brain, 11.8 Gy in 31 fractions for a total dose of 55.8 Gy   Macular degeneration    Stroke [facility])    after surgery in August 2019 mostly recovered still some weakness in left arm   Wears glasses    BP 112/71   Pulse 71   Temp 98.2 F (36.8 C)   Ht 5\' 9"  (1.753 m)   Wt 181 lb (82.1 kg)   SpO2 98%   BMI 26.73 kg/m   Opioid Risk Score:   Fall Risk Score:  `1  Depression screen PHQ 2/9  Depression screen PHQ 2/9 09/13/2018  Decreased Interest 0  Down, Depressed, Hopeless 0  PHQ - 2 Score 0  Altered sleeping 1  Tired, decreased energy 0  Change in appetite 0  Feeling bad or failure about yourself  0  Trouble concentrating 0  Moving slowly or fidgety/restless 0  Suicidal thoughts 0  PHQ-9 Score 1  Difficult doing work/chores Not difficult at all    Review of Systems  Genitourinary:        Urine Retention   Musculoskeletal:  Positive for gait problem.       Right leg not functioning well  All other systems reviewed and are negative.     Objective:   Physical Exam Gen: no distress, normal appearing HEENT: oral mucosa pink and moist, NCAT Cardio: Reg rate Chest: normal effort, normal rate of breathing Abd: soft, non-distended Ext: no edema Psych: pleasant, normal affect Skin: intact Neuro: Patient is alert and oriented x3.  Cranial nerve exam is unremarkable.  Upper extremity strength is 4 out of 5 and grossly equal left to right.  Right lower extremity is 1-2 out of 5 with hip flexion and knee extension.  Hamstrings are 1 out of 5.  He is 1-5 plantarflexion and absent ankle dorsiflexion.  Left lower extremity is 3 out of 5 proximally prep 3+ out of 5, 3-4 out of  5 at the ankle.  Sensory exam slightly diminished.  He demonstrates excellent form with walking demonstrating good knee bend on the right and avoids substitution patterns with swing phase on the right side.  He does skin the right foot slightly but has a kick plate there to facilitate passage of the foot.  He is hyperreflexic 3+ on the right 2+ on the left lower extremities. Musculoskeletal: Full ROM, No pain with AROM or PROM in the neck, trunk, or extremities. Posture appropriate        Assessment & Plan:  Functional deficits secondary to atypical meningioma and subsequent craniotomy with associated spastic weakness -Made referral to outpatient neuro rehab to address lower extremity strength, gait and spasticity.  He is also finishing up with home health therapy.  He is really made nice progress so far. Pain management: Appears well controlled at present Neurogenic bowel and bladder; patient seems to making progress here.  He can stay with bethanechol 50 mg twice a day for now as well as his Rapaflo to facilitate emptying.  I did encourage him to drink more in the morning hours as opposed to evening so that he is not having to wake up to empty at night.  Should be able to reduce his bethanechol further over the next couple months. Spasticity: -Continue baclofen 10 mg twice during the day with increased bedtime dose to 15 mg or perhaps 20 mg depending on tolerance.  Also recommended taking over-the-counter magnesium to help with spasms.  Recommended stretching as well before bed physically to his calf and foot musculature.  5.  Seizures.  Continue Keppra 500 mg twice a day.  No further seizures reported  Thirty minutes of face to face patient care time were spent during this visit. All questions were encouraged and answered. Follow up with me in 3 mos.

## 2021-09-11 NOTE — Patient Instructions (Signed)
SPASTICITY: TAKE 1.5 TABS OF BACLOFEN AT NIGHT. CONSIDER 2 FULL TABS IF YOU'RE NOT TOO SLEEPY WITH THE 1.5 TABS  MAGNESIUM OVER THE COUNTER AT NIGHT    BLADDER: DRINK MORE IN THE MORNING AND MUCH LESS AFTER DINNER.

## 2021-09-24 ENCOUNTER — Ambulatory Visit: Payer: Medicare Other | Attending: Physical Medicine & Rehabilitation | Admitting: Physical Therapy

## 2021-09-24 ENCOUNTER — Other Ambulatory Visit: Payer: Self-pay

## 2021-09-24 DIAGNOSIS — R2689 Other abnormalities of gait and mobility: Secondary | ICD-10-CM | POA: Diagnosis present

## 2021-09-24 DIAGNOSIS — M6281 Muscle weakness (generalized): Secondary | ICD-10-CM | POA: Diagnosis present

## 2021-09-24 DIAGNOSIS — M21371 Foot drop, right foot: Secondary | ICD-10-CM | POA: Insufficient documentation

## 2021-09-24 DIAGNOSIS — R2681 Unsteadiness on feet: Secondary | ICD-10-CM | POA: Insufficient documentation

## 2021-09-25 ENCOUNTER — Encounter: Payer: Self-pay | Admitting: Physical Therapy

## 2021-09-25 NOTE — Therapy (Signed)
[employer] [address] [place], [place], [address] Phone: [phone]   Fax:  [phone]  Physical Therapy Evaluation  Patient Details  Name: James Olsen MRN: [id number] Date of Birth: 15-Mar-1946 Referring Provider (PT): Meredith Staggers, MD   Encounter Date: 09/24/2021   PT End of Session - 09/25/21 1255     Visit Number 1    Number of Visits 17    Date for PT Re-Evaluation 11/24/21    Authorization Type Medicare and AARP - 10th visit PN and KX modifier required    PT Start Time 1105    PT Stop Time 1145    PT Time Calculation (min) 40 min    Activity Tolerance Patient tolerated treatment well    Behavior During Therapy [facility] for tasks assessed/performed             Past Medical History:  Diagnosis Date   Arthritis    GERD (gastroesophageal reflux disease)    Glaucoma    pt denies.    History of blood transfusion     5 units after previous surgery   History of radiation therapy 12/09/18- 01/24/2019   Left sagittal brain, 11.8 Gy in 31 fractions for a total dose of 55.8 Gy   Macular degeneration    Stroke [facility])    after surgery in August 2019 mostly recovered still some weakness in left arm   Wears glasses     Past Surgical History:  Procedure Laterality Date   APPLICATION OF CRANIAL NAVIGATION N/A 06/21/2021   Procedure: APPLICATION OF CRANIAL NAVIGATION;  Surgeon: [name], MD;  Location: [place];  Service: Neurosurgery;  Laterality: N/A;   BRAIN SURGERY     CHOLECYSTECTOMY  2008   lap choli   COLONOSCOPY     CRANIOPLASTY N/A 10/29/2018   Procedure: CRANIOPLASTY;  Surgeon: [name], MD;  Location: [place];  Service: Neurosurgery;  Laterality: N/A;  CRANIOPLASTY   CRANIOTOMY N/A 08/18/2018   Procedure: CRANIOTOMY TUMOR EXCISION;  Surgeon: [name], MD;  Location: [place];  Service: Neurosurgery;  Laterality: N/A;  CRANIOTOMY TUMOR EXCISION   CRANIOTOMY Bilateral 06/21/2021    Procedure: Bilateral Craniotomy for tumor resection;  Surgeon: [name], MD;  Location: [place];  Service: Neurosurgery;  Laterality: Bilateral;   DIAGNOSTIC LAPAROSCOPY     EYE SURGERY     laser surgery to relieve pressure   NAILBED REPAIR Left 05/04/2014   Procedure: LEFT INDEX NAIL ABLATION V-Y FLAP COVERAGE;  Surgeon: [name]., MD;  Location: [place];  Service: Orthopedics;  Laterality: Left;  index   PILONIDAL CYST EXCISION     x2   TONSILLECTOMY     VASECTOMY      There were no vitals filed for this visit.    Subjective Assessment - 09/24/21 1114     Subjective Was doing really well and making progress after therapy in 2019 but in July things changed.  Pt had a seizure and imaging showed the tumor was growing.  Had another tumor resection in July, participated in therapy in CIR and then home health due to wife having health issues.  Right after surgery "my right leg was dead", RUE was weak but not as bad as before.  Still recovering language.  Walking with RW today.  Has rollator at home.  Has made progress with RLE strength but still has drop foot but not using carbon fiber AFO - pt reports it was a tight  fit, hard to don, and wasn't sure it was helping.  Still has leather toe cap but it is peeling off.  Also experiencing spasms.    Pertinent History OA, h/o blood transfusion, h/o radiation therapy, macular degeneration, CVA, craniotomy > cranioplasty and resection of meningioma, R hemiparesis, focal seizures, urinary retention    Limitations Standing;Walking;House hold activities    Patient Stated Goals Walking, gain more strength in RLE to be able to drive in 6 months if seizure free    Currently in Pain? No/denies                [facility] PT Assessment - 09/24/21 1122       Assessment   Medical Diagnosis Atypical Meningioma and R hemiparesis    Referring Provider (PT) Meredith Staggers, MD    Onset Date/Surgical Date 09/11/21    Hand  Dominance Right    Prior Therapy Outpatient, CIR, HH      Precautions   Precautions Other (comment)    Precaution Comments OA, h/o blood transfusion, h/o radiation therapy, macular degeneration, CVA, craniotomy > cranioplasty and resection of meningioma, R hemiparesis, focal seizures, urinary retention    Required Braces or Orthoses Other Brace/Splint    Other Brace/Splint has AFO but he does not wear it now      Balance Screen   Has the patient fallen in the past 6 months No      [place] residence    Living Arrangements Spouse/significant other    Type of Fidelity to enter    Entrance Stairs-Number of Steps 3    Entrance Stairs-Rails [place] to live on main level with bedroom/bathroom;Two level    [place] - 2 wheels;Walker - 4 wheels;Bedside commode;Shower seat;Grab bars - toilet;Grab bars - tub/shower    Additional Comments Not driving due to seizure and R hemiparesis      Prior Function   Level of Independence Independent      Sensation   Light Touch Impaired by gross assessment    Additional Comments full sensation to light touch, does have tingling and heaviness on R side      Coordination   Gross Motor Movements are Fluid and Coordinated No    Finger Nose Finger Test [facility]    Heel Shin Test impaired on R due to weakness      ROM / Strength   AROM / PROM / Strength Strength      Strength   Overall Strength Deficits    Overall Strength Comments LLE 5/5 except DF 4/5.  RLE hip flexion 3-/5, knee extension 3-/5, knee flexion 2/5, ankle PF 2/5, ankle DF with inversion 1/5, no eversion noted.      Special Tests   Other special tests No changes to vision or dizziness      Ambulation/Gait   Ambulation/Gait Yes    Ambulation/Gait Assistance 5: Supervision;4: Min assist    Ambulation/Gait Assistance Details supervision with RW; min A for safety when ambulating without RW due to  increased foot slap/foot drag - one episode of LOB anteriorly requiring total A from therapist to prevent fall forwards.  When ambulating with RW pt able to utilize UE to lift his body and RLE for swing phase to minimize catching/tripping over R foot.  Foot drop/slap is more noticeable when pt ambulates without AD.  Not safe to ambulate at home without AD at this  time    Ambulation Distance (Feet) 115 Feet    Assistive device Rolling walker    Gait Pattern Step-through pattern;Decreased step length - right;Decreased stride length;Decreased hip/knee flexion - right;Decreased dorsiflexion - right;Right steppage;Poor foot clearance - right    Ambulation Surface Level;Indoor      Standardized Balance Assessment   Standardized Balance Assessment Five Times Sit to Stand;10 meter walk test    Five times sit to stand comments  10 sit > stand in 30 seconds - use of UE from mat    10 Meter Walk 18.25 or 1.79 ft/sec with RW; 20.97 or 1.23ft/sec without RW with min A for safety with one episode of toe catch requiring total A to prevent fall                        Objective measurements completed on examination: See above findings.                PT Education - 09/25/21 1254     Education Details Clinical findings, PT POC and goals, offered to request order for OT and SLP if pt needs increased assistance for UE strength and speech - pt declined at this time.    Person(s) Educated Patient    Methods Explanation    Comprehension Verbalized understanding              PT Short Term Goals - 09/25/21 1352       PT SHORT TERM GOAL #1   Title Pt will demonstrate independence with initial HEP    Time 4    Period Weeks    Status New    Target Date 10/25/21      PT SHORT TERM GOAL #2   Title Pt will demonstrate improved functional LE strength as indicated by ability to perform 8-9 reps of sit <> stand WITHOUT use of UE in 30 seconds    Baseline 10 in 30 seconds with UE use     Time 4    Period Weeks    Status New    Target Date 10/25/21      PT SHORT TERM GOAL #3   Title Pt will participate in further assessment of balance impairments with Mini Best test    Time 4    Period Weeks    Status New    Target Date 10/25/21      PT SHORT TERM GOAL #4   Title Pt will improve gait velocity with RW to >/= 2.62 ft/sec    Time 4    Period Weeks    Status New    Target Date 10/25/21      PT SHORT TERM GOAL #5   Title Pt will ambulate without AD x 115' over level indoor surfaces with supervision and no LOB    Time 4    Period Weeks    Status New    Target Date 10/25/21               PT Long Term Goals - 09/25/21 1357       PT LONG TERM GOAL #1   Title Pt will demonstrate independence with final HEP    Time 8    Period Weeks    Status New    Target Date 11/24/21      PT LONG TERM GOAL #2   Title Pt will increase gait velocity without AD to >/= 2.62 ft/sec without LOB    Time 8  Period Weeks    Status New    Target Date 11/24/21      PT LONG TERM GOAL #3   Title Pt will increase score on Mini-Best test by 5 points to indicate decreased falls risk    Time 8    Period Weeks    Status New    Target Date 11/24/21      PT LONG TERM GOAL #4   Title Pt will ambulate x 250' outside over uneven pavement, negotiate ramp, curb and 12 stairs with LRAD MOD I.    Time 8    Period Weeks    Status New    Target Date 11/24/21                    Plan - 09/25/21 1255     Clinical Impression Statement Pt is a 75 year old male referred to Neuro OPPT for evaluation of R hemiparesis and foot drop after meningioma resection.  Pt's PMH is significant for the following: OA, h/o blood transfusion, h/o radiation therapy, macular degeneration, CVA, craniotomy > cranioplasty and resection of meningioma, R hemiparesis, focal seizures, urinary retention. The following deficits were noted during pt's exam: R hemiparesis with LE weakness > UE weakness,  hypertonicity, R foot drop, impaired standing balance, and gait abnormalities.  Pt's gait velocity score indicates pt is at increased risk for recurrent falls and when ambulating without an AD pt experienced a near fall requiring assistance from PT to recover. Pt would benefit from skilled PT to address these impairments and functional limitations to maximize functional mobility independence and reduce falls risk.    Personal Factors and Comorbidities Comorbidity 3+;Past/Current Experience;Social Background;Transportation    Comorbidities OA, h/o blood transfusion, h/o radiation therapy, macular degeneration, CVA, craniotomy > cranioplasty and resection of meningioma, R hemiparesis, focal seizures, urinary retention    Examination-Activity Limitations Locomotion Level;Stairs;Stand    Examination-Participation Restrictions Community Activity;Driving;Yard Work    [employer] officer Evolving/Moderate complexity    Clinical Decision Making Moderate    Rehab Potential Good    PT Frequency 1x / week    PT Duration 8 weeks    PT Treatment/Interventions ADLs/Self Care Home Management;Aquatic Therapy;DME Instruction;Gait training;Stair training;Functional mobility training;Therapeutic activities;Therapeutic exercise;Balance training;Neuromuscular re-education;Patient/family education;Passive range of motion    PT Next Visit Plan Assess Mini-Best test and reset baseline.  See if pt will bring in AFO and shoes and see if you can figure out a better way for him to don so he will be more willing to wear it.  Initiate HEP focusing on RLE strength, standing balance without UE support, gait working towards decreased use of RW.  (NO e-stim due to CA and seizures)    Consulted and Agree with Plan of Care Patient             Patient will benefit from skilled therapeutic intervention in order to improve the following deficits and impairments:  Abnormal gait, Decreased activity tolerance, Decreased  balance, Decreased strength, Difficulty walking  Visit Diagnosis: Muscle weakness (generalized)  Foot drop, right  Other abnormalities of gait and mobility  Unsteadiness on feet     Problem List Patient Active Problem List   Diagnosis Date Noted   Spasticity 09/11/2021   Urinary retention 07/17/2021   S/P resection of meningioma 06/26/2021   S/P craniotomy 06/21/2021   Focal seizures ([place]) 05/06/2021   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   Meningioma ([place]) 10/29/2018   Other abnormalities of gait and mobility 09/17/2018  Acute cerebral infarction (HCC)    Thrombocytopenia (HCC)    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 ([place])    Acute blood loss anemia    Generalized OA    Gastroesophageal reflux disease    Seizure prophylaxis    Leukocytosis    Tumor 08/18/2018   Meningioma determined by biopsy of brain ([place]) 08/18/2018   MACULAR DEGENERATION 02/29/2008   ALLERGY 02/29/2008   Nonspecific (abnormal) findings on radiological and other examination of body structure 12/26/2007   NONSPECIFIC ABNORM FIND RAD&OTH EXAM LUNG FIELD 12/26/2007   GALLSTONES 10/01/2007   ELEVATION, TRANSAMINASE/LDH LEVELS 09/06/2007   HEPATITIS NOS 09/01/2007   ABDOMINAL PAIN, EPIGASTRIC 09/01/2007    Rico Junker, PT, DPT 09/25/21    2:15 PM    [facility] [address] [place], [place], [address] Phone: [phone]   Fax:  [phone]  Name: James Olsen MRN: [id number] Date of Birth: 10/31/1946

## 2021-10-01 ENCOUNTER — Encounter: Payer: Self-pay | Admitting: Physical Therapy

## 2021-10-01 ENCOUNTER — Ambulatory Visit: Payer: Medicare Other | Admitting: Physical Therapy

## 2021-10-01 ENCOUNTER — Other Ambulatory Visit: Payer: Self-pay

## 2021-10-01 DIAGNOSIS — M21371 Foot drop, right foot: Secondary | ICD-10-CM

## 2021-10-01 DIAGNOSIS — R2681 Unsteadiness on feet: Secondary | ICD-10-CM

## 2021-10-01 DIAGNOSIS — R2689 Other abnormalities of gait and mobility: Secondary | ICD-10-CM

## 2021-10-01 DIAGNOSIS — M6281 Muscle weakness (generalized): Secondary | ICD-10-CM | POA: Diagnosis not present

## 2021-10-02 NOTE — Therapy (Signed)
[facility] [address] [place], [place], [address] Phone: [phone]   Fax:  [phone]  Physical Therapy Treatment  Patient Details  Name: James Olsen MRN: [id number] Date of Birth: December 09, 1946 Referring Provider (PT): Meredith Staggers, MD   Encounter Date: 10/01/2021   PT End of Session - 10/01/21 1322     Visit Number 2    Number of Visits 17    Date for PT Re-Evaluation 11/24/21    Authorization Type Medicare and AARP - 10th visit PN and KX modifier required    PT Start Time 1318    PT Stop Time 1400    PT Time Calculation (min) 42 min    Equipment Utilized During Treatment Gait belt    Activity Tolerance Patient tolerated treatment well;No increased pain    Behavior During Therapy WFL for tasks assessed/performed             Past Medical History:  Diagnosis Date   Arthritis    GERD (gastroesophageal reflux disease)    Glaucoma    pt denies.    History of blood transfusion     5 units after previous surgery   History of radiation therapy 12/09/18- 01/24/2019   Left sagittal brain, 11.8 Gy in 31 fractions for a total dose of 55.8 Gy   Macular degeneration    Stroke [facility])    after surgery in August 2019 mostly recovered still some weakness in left arm   Wears glasses     Past Surgical History:  Procedure Laterality Date   APPLICATION OF CRANIAL NAVIGATION N/A 06/21/2021   Procedure: APPLICATION OF CRANIAL NAVIGATION;  Surgeon: [name], MD;  Location: [place];  Service: Neurosurgery;  Laterality: N/A;   BRAIN SURGERY     CHOLECYSTECTOMY  2008   lap choli   COLONOSCOPY     CRANIOPLASTY N/A 10/29/2018   Procedure: CRANIOPLASTY;  Surgeon: [name], MD;  Location: [place];  Service: Neurosurgery;  Laterality: N/A;  CRANIOPLASTY   CRANIOTOMY N/A 08/18/2018   Procedure: CRANIOTOMY TUMOR EXCISION;  Surgeon: [name], MD;  Location: [place];  Service: Neurosurgery;  Laterality: N/A;   CRANIOTOMY TUMOR EXCISION   CRANIOTOMY Bilateral 06/21/2021   Procedure: Bilateral Craniotomy for tumor resection;  Surgeon: [name], MD;  Location: [place];  Service: Neurosurgery;  Laterality: Bilateral;   DIAGNOSTIC LAPAROSCOPY     EYE SURGERY     laser surgery to relieve pressure   NAILBED REPAIR Left 05/04/2014   Procedure: LEFT INDEX NAIL ABLATION V-Y FLAP COVERAGE;  Surgeon: [name]., MD;  Location: [place];  Service: Orthopedics;  Laterality: Left;  index   PILONIDAL CYST EXCISION     x2   TONSILLECTOMY     VASECTOMY      There were no vitals filed for this visit.   Subjective Assessment - 10/01/21 1321     Subjective No new complaints. No falls or pain to report. Did not bring AFO this session.    Pertinent History OA, h/o blood transfusion, h/o radiation therapy, macular degeneration, CVA, craniotomy > cranioplasty and resection of meningioma, R hemiparesis, focal seizures, urinary retention    Limitations Standing;Walking;House hold activities    Patient Stated Goals Walking, gain more strength in RLE to be able to drive in 6 months if seizure free    Currently in Pain? No/denies                [facility] PT  Assessment - 10/01/21 1323       Standardized Balance Assessment   Standardized Balance Assessment Mini-BESTest      Mini-BESTest   Sit To Stand Moderate: Comes to stand WITH use of hands on first attempt.    Rise to Toes < 3 s.    Stand on one leg (left) Moderate: < 20 s   has to lift leg forward. 6-8 sec's on left stance, 1-2 sec's on right stance   Stand on one leg (right) Moderate: < 20 s    Stand on one leg - lowest score 1    Compensatory Stepping Correction - Forward No step, OR would fall if not caught, OR falls spontaneously.    Compensatory Stepping Correction - Backward No step, OR would fall if not caught, OR falls spontaneously.    Compensatory Stepping Correction - Left Lateral Severe: Falls, or cannot step     Compensatory Stepping Correction - Right Lateral Moderate: Several steps to recover equilibrium    Stepping Corredtion Lateral - lowest score 0    Stance - Feet together, eyes open, firm surface  Normal: 30s    Stance - Feet together, eyes closed, foam surface  Normal: 30s    Incline - Eyes Closed Normal: Stands independently 30s and aligns with gravity    Change in Gait Speed Moderate: Unable to change walking speed or signs of imbalance    Walk with head turns - Horizontal Severe: performs head turns with imbalance   veers with increased right foot catching noted   Walk with pivot turns Severe: Cannot turn with feet close at any speed without imbalance.   pivots on right LE   Step over obstacles Severe: Unable to step over box OR steps around box   foot catches regardless of which foot he leads with   Timed UP & GO with Dual Task Severe: Stops counting while walking OR stops walking while counting.    Mini-BEST total score 9                [facility] Adult PT Treatment/Exercise - 10/01/21 1323       Transfers   Transfers Sit to Stand;Stand to Sit    Sit to Stand 5: Supervision;With upper extremity assist;From bed;From chair/3-in-1    Stand to Sit 4: Min guard;With upper extremity assist;To bed;To chair/3-in-1      Ambulation/Gait   Ambulation/Gait Yes    Ambulation/Gait Assistance 5: Supervision;4: Min assist    Ambulation Distance (Feet) 115 Feet   x1 with foot up brace on, plus around clinic with session   Assistive device Rolling walker    Gait Pattern Step-through pattern;Decreased step length - right;Decreased stride length;Decreased hip/knee flexion - right;Decreased dorsiflexion - right;Right steppage;Poor foot clearance - right    Ambulation Surface Level;Indoor                 PT Short Term Goals - 09/25/21 1352       PT SHORT TERM GOAL #1   Title Pt will demonstrate independence with initial HEP    Time 4    Period Weeks    Status New    Target Date 10/25/21       PT SHORT TERM GOAL #2   Title Pt will demonstrate improved functional LE strength as indicated by ability to perform 8-9 reps of sit <> stand WITHOUT use of UE in 30 seconds    Baseline 10 in 30 seconds with UE use    Time 4  Period Weeks    Status New    Target Date 10/25/21      PT SHORT TERM GOAL #3   Title Pt will participate in further assessment of balance impairments with Mini Best test    Time 4    Period Weeks    Status New    Target Date 10/25/21      PT SHORT TERM GOAL #4   Title Pt will improve gait velocity with RW to >/= 2.62 ft/sec    Time 4    Period Weeks    Status New    Target Date 10/25/21      PT SHORT TERM GOAL #5   Title Pt will ambulate without AD x 115' over level indoor surfaces with supervision and no LOB    Time 4    Period Weeks    Status New    Target Date 10/25/21               PT Long Term Goals - 09/25/21 1357       PT LONG TERM GOAL #1   Title Pt will demonstrate independence with final HEP    Time 8    Period Weeks    Status New    Target Date 11/24/21      PT LONG TERM GOAL #2   Title Pt will increase gait velocity without AD to >/= 2.62 ft/sec without LOB    Time 8    Period Weeks    Status New    Target Date 11/24/21      PT LONG TERM GOAL #3   Title Pt will increase score on Mini-Best test by 5 points to indicate decreased falls risk    Time 8    Period Weeks    Status New    Target Date 11/24/21      PT LONG TERM GOAL #4   Title Pt will ambulate x 250' outside over uneven pavement, negotiate ramp, curb and 12 stairs with LRAD MOD I.    Time 8    Period Weeks    Status New    Target Date 11/24/21                   Plan - 10/01/21 1322     Clinical Impression Statement Today's skilled session initially focused on establishment of baseline score for Mini-Best test with pt scoring 9 today. Also trialed foot up brace on right LE with less scuffing noted. Will benefit from continued trials in  sessions to see if continues to work when pt is more fatigued. The pt is making progress toward goals and should benefit from continued PT to progress toward unmet goals.    Personal Factors and Comorbidities Comorbidity 3+;Past/Current Experience;Social Background;Transportation    Comorbidities OA, h/o blood transfusion, h/o radiation therapy, macular degeneration, CVA, craniotomy > cranioplasty and resection of meningioma, R hemiparesis, focal seizures, urinary retention    Examination-Activity Limitations Locomotion Level;Stairs;Stand    Examination-Participation Restrictions Community Activity;Driving;Yard Work    [employer]chant [employer] officer Evolving/Moderate complexity    Rehab Potential Good    PT Frequency 1x / week    PT Duration 8 weeks    PT Treatment/Interventions ADLs/Self Care Home Management;Aquatic Therapy;DME Instruction;Gait training;Stair training;Functional mobility training;Therapeutic activities;Therapeutic exercise;Balance training;Neuromuscular re-education;Patient/family education;Passive range of motion    PT Next Visit Plan Initiate HEP focusing on RLE strength, standing balance without UE support, gait working towards decreased use of RW.  (NO e-stim due to CA and  seizures). continue to assess foot up vs pt's brace, need for new brace as pt/spouse unable to don his current AFO    Consulted and Agree with Plan of Care Patient             Patient will benefit from skilled therapeutic intervention in order to improve the following deficits and impairments:  Abnormal gait, Decreased activity tolerance, Decreased balance, Decreased strength, Difficulty walking  Visit Diagnosis: Muscle weakness (generalized)  Foot drop, right  Other abnormalities of gait and mobility  Unsteadiness on feet     Problem List Patient Active Problem List   Diagnosis Date Noted   Spasticity 09/11/2021   Urinary retention 07/17/2021   S/P resection of meningioma 06/26/2021    S/P craniotomy 06/21/2021   Focal seizures ([place]) 05/06/2021   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   Meningioma ([place]) 10/29/2018   Other abnormalities of gait and mobility 09/17/2018   Acute cerebral infarction (HCC)    Thrombocytopenia (HCC)    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 ([place])    Acute blood loss anemia    Generalized OA    Gastroesophageal reflux disease    Seizure prophylaxis    Leukocytosis    Tumor 08/18/2018   Meningioma determined by biopsy of brain ([place]) 08/18/2018   MACULAR DEGENERATION 02/29/2008   ALLERGY 02/29/2008   Nonspecific (abnormal) findings on radiological and other examination of body structure 12/26/2007   NONSPECIFIC ABNORM FIND RAD&OTH EXAM LUNG FIELD 12/26/2007   GALLSTONES 10/01/2007   ELEVATION, TRANSAMINASE/LDH LEVELS 09/06/2007   HEPATITIS NOS 09/01/2007   ABDOMINAL PAIN, EPIGASTRIC 09/01/2007   Willow Ora, PTA, [facility] [address], [place], [place] [address] [phone] 10/02/21, 11:44 AM    Name: VINH SACHS MRN: [id number] Date of Birth: 1946-11-18

## 2021-10-03 ENCOUNTER — Encounter: Payer: Self-pay | Admitting: Physical Therapy

## 2021-10-03 ENCOUNTER — Ambulatory Visit: Payer: Medicare Other | Admitting: Physical Therapy

## 2021-10-03 ENCOUNTER — Other Ambulatory Visit: Payer: Self-pay

## 2021-10-03 DIAGNOSIS — M6281 Muscle weakness (generalized): Secondary | ICD-10-CM | POA: Diagnosis not present

## 2021-10-03 DIAGNOSIS — R2689 Other abnormalities of gait and mobility: Secondary | ICD-10-CM

## 2021-10-03 DIAGNOSIS — M21371 Foot drop, right foot: Secondary | ICD-10-CM

## 2021-10-03 DIAGNOSIS — R2681 Unsteadiness on feet: Secondary | ICD-10-CM

## 2021-10-03 NOTE — Patient Instructions (Signed)
Access Code: [email or web address] URL: [email or web address] Date: 10/03/2021 Prepared by: Willow Ora  Exercises Sit to/from Stand in Stride position - 1 x daily - 5 x weekly - 1 sets - 5-10 reps Standing Hip Abduction with Counter Support - 1 x daily - 5 x weekly - 1 sets - 10 reps Standing Tandem Balance with Counter Support - 1 x daily - 5 x weekly - 1 sets - 2-3 reps - 15 sec's hold Wide Stance with Eyes Closed on Foam Pad - 1 x daily - 5 x weekly - 1 sets - 3 reps - 30 hold

## 2021-10-06 ENCOUNTER — Other Ambulatory Visit: Payer: Self-pay | Admitting: Internal Medicine

## 2021-10-07 NOTE — Therapy (Signed)
[facility] [address] [place], [place], [address] Phone: [phone]   Fax:  [phone]  Physical Therapy Treatment  Patient Details  Name: James Olsen MRN: [id number] Date of Birth: 1946-08-12 Referring Provider (PT): Meredith Staggers, MD   Encounter Date: 10/03/2021    10/03/21 1321  PT Visits / Re-Eval  Visit Number 3  Number of Visits 17  Date for PT Re-Evaluation 11/24/21  Authorization  Authorization Type Medicare and AARP - 10th visit PN and KX modifier required  PT Time Calculation  PT Start Time 1317  PT Stop Time 1400  PT Time Calculation (min) 43 min  PT - End of Session  Equipment Utilized During Treatment Gait belt  Activity Tolerance Patient tolerated treatment well;No increased pain  Behavior During Therapy WFL for tasks assessed/performed    Past Medical History:  Diagnosis Date   Arthritis    GERD (gastroesophageal reflux disease)    Glaucoma    pt denies.    History of blood transfusion     5 units after previous surgery   History of radiation therapy 12/09/18- 01/24/2019   Left sagittal brain, 11.8 Gy in 31 fractions for a total dose of 55.8 Gy   Macular degeneration    Stroke [facility])    after surgery in August 2019 mostly recovered still some weakness in left arm   Wears glasses     Past Surgical History:  Procedure Laterality Date   APPLICATION OF CRANIAL NAVIGATION N/A 06/21/2021   Procedure: APPLICATION OF CRANIAL NAVIGATION;  Surgeon: [name], MD;  Location: [facility];  Service: Neurosurgery;  Laterality: N/A;   BRAIN SURGERY     CHOLECYSTECTOMY  2008   lap choli   COLONOSCOPY     CRANIOPLASTY N/A 10/29/2018   Procedure: CRANIOPLASTY;  Surgeon: [name], MD;  Location: [facility];  Service: Neurosurgery;  Laterality: N/A;  CRANIOPLASTY   CRANIOTOMY N/A 08/18/2018   Procedure: CRANIOTOMY TUMOR EXCISION;  Surgeon: [name], MD;  Location: [facility];  Service:  Neurosurgery;  Laterality: N/A;  CRANIOTOMY TUMOR EXCISION   CRANIOTOMY Bilateral 06/21/2021   Procedure: Bilateral Craniotomy for tumor resection;  Surgeon: [name], MD;  Location: [facility];  Service: Neurosurgery;  Laterality: Bilateral;   DIAGNOSTIC LAPAROSCOPY     EYE SURGERY     laser surgery to relieve pressure   NAILBED REPAIR Left 05/04/2014   Procedure: LEFT INDEX NAIL ABLATION V-Y FLAP COVERAGE;  Surgeon: [name]., MD;  Location: [facility];  Service: Orthopedics;  Laterality: Left;  index   PILONIDAL CYST EXCISION     x2   TONSILLECTOMY     VASECTOMY      There were no vitals filed for this visit.      10/03/21 1320  Symptoms/Limitations  Subjective No new complaints. No falls or pain to report. Brought AFO this session- posterior Ottobock walkon brace.  Pertinent History OA, h/o blood transfusion, h/o radiation therapy, macular degeneration, CVA, craniotomy > cranioplasty and resection of meningioma, R hemiparesis, focal seizures, urinary retention  Limitations Standing;Walking;House hold activities  Patient Stated Goals Walking, gain more strength in RLE to be able to drive in 6 months if seizure free  Pain Assessment  Currently in Pain? No/denies      10/03/21 1331  Transfers  Transfers Sit to Stand;Stand to Sit  Sit to Stand 5: Supervision;With upper extremity assist;From bed;From chair/3-in-1  Stand to Sit 4: Min guard;With upper extremity assist;To bed;To  chair/3-in-1  Ambulation/Gait  Ambulation/Gait Yes  Ambulation/Gait Assistance 5: Supervision;4: Min guard  Ambulation/Gait Assistance Details no balance issues noted. scuffing of right foot noted with leather toe cap allowing foot to slide and not catch.  Ambulation Distance (Feet) 230 Feet (x1 with AFO/brace)  Assistive device Rolling walker  Gait Pattern Step-through pattern;Decreased step length - right;Decreased stride length;Decreased hip/knee flexion - right;Decreased  dorsiflexion - right;Right steppage;Poor foot clearance - right  Ambulation Surface Level;Indoor  Therapeutic Activites   Therapeutic Activities Other Therapeutic Activities  Other Therapeutic Activities had pt work on self donning AFO in shoe without and with use of shoe horn. Does better without shoe horn. Would at most need assist from spouse to get toes into shoe, then pt was able to complete donning.  Exercises  Exercises Other Exercises  Other Exercises  issued HEP for strengthening and balance. Refer to [facility] for full details. Cues on form and technique. Min guard assist for balance ex's for safety.   Issued to HEP this session:  Access Code: [email or web address] URL: [email or web address] Date: 10/03/2021 Prepared by: Willow Ora  Exercises Sit to/from Stand in Stride position - 1 x daily - 5 x weekly - 1 sets - 5-10 reps Standing Hip Abduction with Counter Support - 1 x daily - 5 x weekly - 1 sets - 10 reps Standing Tandem Balance with Counter Support - 1 x daily - 5 x weekly - 1 sets - 2-3 reps - 15 sec's hold Wide Stance with Eyes Closed on Foam Pad - 1 x daily - 5 x weekly - 1 sets - 3 reps - 30 hold     10/03/21 1920  PT Education  Education Details donning AFO; initial HEP for strengthening and balance  Person(s) Educated Patient  Methods Explanation;Demonstration;Verbal cues;Handout  Comprehension Verbalized understanding;Returned demonstration;Verbal cues required;Need further instruction            PT Short Term Goals - 09/25/21 1352       PT SHORT TERM GOAL #1   Title Pt will demonstrate independence with initial HEP    Time 4    Period Weeks    Status New    Target Date 10/25/21      PT SHORT TERM GOAL #2   Title Pt will demonstrate improved functional LE strength as indicated by ability to perform 8-9 reps of sit <> stand WITHOUT use of UE in 30 seconds    Baseline 10 in 30 seconds with UE use    Time 4    Period Weeks    Status New     Target Date 10/25/21      PT SHORT TERM GOAL #3   Title Pt will participate in further assessment of balance impairments with Mini Best test    Time 4    Period Weeks    Status New    Target Date 10/25/21      PT SHORT TERM GOAL #4   Title Pt will improve gait velocity with RW to >/= 2.62 ft/sec    Time 4    Period Weeks    Status New    Target Date 10/25/21      PT SHORT TERM GOAL #5   Title Pt will ambulate without AD x 115' over level indoor surfaces with supervision and no LOB    Time 4    Period Weeks    Status New    Target Date 10/25/21  PT Long Term Goals - 09/25/21 1357       PT LONG TERM GOAL #1   Title Pt will demonstrate independence with final HEP    Time 8    Period Weeks    Status New    Target Date 11/24/21      PT LONG TERM GOAL #2   Title Pt will increase gait velocity without AD to >/= 2.62 ft/sec without LOB    Time 8    Period Weeks    Status New    Target Date 11/24/21      PT LONG TERM GOAL #3   Title Pt will increase score on Mini-Best test by 5 points to indicate decreased falls risk    Time 8    Period Weeks    Status New    Target Date 11/24/21      PT LONG TERM GOAL #4   Title Pt will ambulate x 250' outside over uneven pavement, negotiate ramp, curb and 12 stairs with LRAD MOD I.    Time 8    Period Weeks    Status New    Target Date 11/24/21               10/03/21 1321  Plan  Clinical Impression Statement Today's skilled session focused self donning AFO and gait with AFO. Then remainder of session focusesd on establishing an HEP for strengthening and balance. No issues noted or reported in session. The pt is making steady progress toward goals and should benefit from continued PT to progress toward unmet goals.  Personal Factors and Comorbidities Comorbidity 3+;Past/Current Experience;Social Background;Transportation  Comorbidities OA, h/o blood transfusion, h/o radiation therapy, macular degeneration,  CVA, craniotomy > cranioplasty and resection of meningioma, R hemiparesis, focal seizures, urinary retention  Examination-Activity Limitations Locomotion Level;Stairs;Stand  Examination-Participation Restrictions Community Activity;Driving;Yard Work  Pt will benefit from skilled therapeutic intervention in order to improve on the following deficits Abnormal gait;Decreased activity tolerance;Decreased balance;Decreased strength;Difficulty walking  Stability/Clinical Decision Making Evolving/Moderate complexity  Rehab Potential Good  PT Frequency 1x / week  PT Duration 8 weeks  PT Treatment/Interventions ADLs/Self Care Home Management;Aquatic Therapy;DME Instruction;Gait training;Stair training;Functional mobility training;Therapeutic activities;Therapeutic exercise;Balance training;Neuromuscular re-education;Patient/family education;Passive range of motion  PT Next Visit Plan No estim due to CA and seizures. add to HEP balance ex's as indicated; continue to work on self donning AFO as needed. gait with AFO, balance training and strengthening  PT Home Exercise Plan Access Code: [email or web address]  Consulted and Agree with Plan of Care Patient         Patient will benefit from skilled therapeutic intervention in order to improve the following deficits and impairments:  Abnormal gait, Decreased activity tolerance, Decreased balance, Decreased strength, Difficulty walking  Visit Diagnosis: Muscle weakness (generalized)  Foot drop, right  Other abnormalities of gait and mobility  Unsteadiness on feet     Problem List Patient Active Problem List   Diagnosis Date Noted   Spasticity 09/11/2021   Urinary retention 07/17/2021   S/P resection of meningioma 06/26/2021   S/P craniotomy 06/21/2021   Focal seizures ([facility]) 05/06/2021   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   Meningioma ([facility]) 10/29/2018   Other abnormalities of gait and mobility 09/17/2018   Acute cerebral infarction (HCC)     Thrombocytopenia (HCC)    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 ([place])    Acute blood loss anemia    Generalized OA    Gastroesophageal reflux disease  Seizure prophylaxis    Leukocytosis    Tumor 08/18/2018   Meningioma determined by biopsy of brain ([facility]) 08/18/2018   MACULAR DEGENERATION 02/29/2008   ALLERGY 02/29/2008   Nonspecific (abnormal) findings on radiological and other examination of body structure 12/26/2007   NONSPECIFIC ABNORM FIND RAD&OTH EXAM LUNG FIELD 12/26/2007   GALLSTONES 10/01/2007   ELEVATION, TRANSAMINASE/LDH LEVELS 09/06/2007   HEPATITIS NOS 09/01/2007   ABDOMINAL PAIN, EPIGASTRIC 09/01/2007    Willow Ora, PTA, [facility] [address], [place], [place] [address] [phone] 10/07/21, 7:45 PM   Name: James Olsen MRN: [id number] Date of Birth: Aug 16, 1946

## 2021-10-08 ENCOUNTER — Encounter: Payer: Self-pay | Admitting: Rehabilitation

## 2021-10-08 ENCOUNTER — Other Ambulatory Visit: Payer: Self-pay

## 2021-10-08 ENCOUNTER — Ambulatory Visit: Payer: Medicare Other | Admitting: Rehabilitation

## 2021-10-08 DIAGNOSIS — M6281 Muscle weakness (generalized): Secondary | ICD-10-CM | POA: Diagnosis not present

## 2021-10-08 DIAGNOSIS — R2689 Other abnormalities of gait and mobility: Secondary | ICD-10-CM

## 2021-10-08 DIAGNOSIS — R2681 Unsteadiness on feet: Secondary | ICD-10-CM

## 2021-10-08 DIAGNOSIS — M21371 Foot drop, right foot: Secondary | ICD-10-CM

## 2021-10-08 NOTE — Therapy (Signed)
[facility] [address] [place], [place], [address] Phone: [phone]   Fax:  [phone]  Physical Therapy Treatment  Patient Details  Name: James Olsen MRN: [id number] Date of Birth: 11-Jul-1946 Referring Provider (PT): Meredith Staggers, MD   Encounter Date: 10/08/2021   PT End of Session - 10/08/21 1537     Visit Number 4    Number of Visits 17    Date for PT Re-Evaluation 11/24/21    Authorization Type Medicare and AARP - 10th visit PN and KX modifier required    PT Start Time 1016    PT Stop Time 1059    PT Time Calculation (min) 43 min    Equipment Utilized During Treatment Gait belt    Activity Tolerance Patient tolerated treatment well;No increased pain    Behavior During Therapy WFL for tasks assessed/performed             Past Medical History:  Diagnosis Date   Arthritis    GERD (gastroesophageal reflux disease)    Glaucoma    pt denies.    History of blood transfusion     5 units after previous surgery   History of radiation therapy 12/09/18- 01/24/2019   Left sagittal brain, 11.8 Gy in 31 fractions for a total dose of 55.8 Gy   Macular degeneration    Stroke [facility])    after surgery in August 2019 mostly recovered still some weakness in left arm   Wears glasses     Past Surgical History:  Procedure Laterality Date   APPLICATION OF CRANIAL NAVIGATION N/A 06/21/2021   Procedure: APPLICATION OF CRANIAL NAVIGATION;  Surgeon: [name], MD;  Location: [facility];  Service: Neurosurgery;  Laterality: N/A;   BRAIN SURGERY     CHOLECYSTECTOMY  2008   lap choli   COLONOSCOPY     CRANIOPLASTY N/A 10/29/2018   Procedure: CRANIOPLASTY;  Surgeon: [name], MD;  Location: [facility];  Service: Neurosurgery;  Laterality: N/A;  CRANIOPLASTY   CRANIOTOMY N/A 08/18/2018   Procedure: CRANIOTOMY TUMOR EXCISION;  Surgeon: [name], MD;  Location: [facility];  Service: Neurosurgery;  Laterality: N/A;   CRANIOTOMY TUMOR EXCISION   CRANIOTOMY Bilateral 06/21/2021   Procedure: Bilateral Craniotomy for tumor resection;  Surgeon: [name], MD;  Location: [facility];  Service: Neurosurgery;  Laterality: Bilateral;   DIAGNOSTIC LAPAROSCOPY     EYE SURGERY     laser surgery to relieve pressure   NAILBED REPAIR Left 05/04/2014   Procedure: LEFT INDEX NAIL ABLATION V-Y FLAP COVERAGE;  Surgeon: [name]., MD;  Location: [facility];  Service: Orthopedics;  Laterality: Left;  index   PILONIDAL CYST EXCISION     x2   TONSILLECTOMY     VASECTOMY      There were no vitals filed for this visit.   Subjective Assessment - 10/08/21 1536     Subjective Pt reports continued difficulty donning brace, but does have on today.    Pertinent History OA, h/o blood transfusion, h/o radiation therapy, macular degeneration, CVA, craniotomy > cranioplasty and resection of meningioma, R hemiparesis, focal seizures, urinary retention    Limitations Standing;Walking;House hold activities    Patient Stated Goals Walking, gain more strength in RLE to be able to drive in 6 months if seizure free    Currently in Pain? No/denies  [facility] Adult PT Treatment/Exercise - 10/08/21 1030       Transfers   Transfers Sit to Stand;Stand to Sit    Sit to Stand 5: Supervision;With upper extremity assist;From bed;From chair/3-in-1    Stand to Sit 5: Supervision      Ambulation/Gait   Ambulation/Gait Yes    Ambulation/Gait Assistance 5: Supervision;4: Min guard    Ambulation/Gait Assistance Details Pt able to ambulate at S level into clinic with RW and R ottobock walk on AFO with min cues for posture and closeness to RW.  We did trial the foot up brace again today, however his foot does drag slightly more than with AFO and it was just as difficult for him to try and don himself as the AFO so I"m not sure that its beneficial to pursue this at this time.     Ambulation Distance (Feet) 115 Feet   x 2   Assistive device Rolling walker    Gait Pattern Step-through pattern;Decreased step length - right;Decreased stride length;Decreased hip/knee flexion - right;Decreased dorsiflexion - right;Right steppage;Poor foot clearance - right    Ambulation Surface Level;Indoor    Stairs Yes    Stairs Assistance 4: Min assist    Stairs Assistance Details (indicate cue type and reason) Performed x 2 as pt reports difficulty in RLE placement when descending stairs.  It appears that tone increases when doing stairs as pt plantar flexes at ankle and adducts at hip.  It did improve with cues for hand placement and cues to remain on LLE slightly longer.    Stair Management Technique Two rails;Alternating pattern;Step to pattern;Forwards    Number of Stairs 8    Height of Stairs 6      Self-Care   Self-Care Other Self-Care Comments    Other Self-Care Comments  During session, still had pt work to don brace himself, however due to flexibiltiy deficits and issues with managing tounge of shoe, he still requires assistance with this at least to get toes into shoe then he can complete the rest.      Neuro Re-ed    Neuro Re-ed Details  Worked on balance in // bars:  Standing on foam airex with feet apart EO performing head turns side/side and up/down x 10 reps each, feet together EO x 2 sets of 20 secs>EC x 2 sets of 20 secs.  Standing on foam tapping to cones (alt LEs) x 10 reps with BUE support>single UE support.  Attempted without UE support, however was too difficult therefore removed foam and tried again. Pt able to tap LLE x 5 reps and with very light single UE support is able to tap RLE x 5 reps (with min A for adequate weight shift).                     PT Education - 10/08/21 1536     Education Details Education on the need to wear brace at all times (during awake hours) and use of RW at all times as he reports he still ambulates some at home without  walker or brace.    Person(s) Educated Patient    Methods Explanation    Comprehension Verbalized understanding              PT Short Term Goals - 09/25/21 1352       PT SHORT TERM GOAL #1   Title Pt will demonstrate independence with initial HEP    Time 4    Period Weeks  Status New    Target Date 10/25/21      PT SHORT TERM GOAL #2   Title Pt will demonstrate improved functional LE strength as indicated by ability to perform 8-9 reps of sit <> stand WITHOUT use of UE in 30 seconds    Baseline 10 in 30 seconds with UE use    Time 4    Period Weeks    Status New    Target Date 10/25/21      PT SHORT TERM GOAL #3   Title Pt will participate in further assessment of balance impairments with Mini Best test    Time 4    Period Weeks    Status New    Target Date 10/25/21      PT SHORT TERM GOAL #4   Title Pt will improve gait velocity with RW to >/= 2.62 ft/sec    Time 4    Period Weeks    Status New    Target Date 10/25/21      PT SHORT TERM GOAL #5   Title Pt will ambulate without AD x 115' over level indoor surfaces with supervision and no LOB    Time 4    Period Weeks    Status New    Target Date 10/25/21               PT Long Term Goals - 09/25/21 1357       PT LONG TERM GOAL #1   Title Pt will demonstrate independence with final HEP    Time 8    Period Weeks    Status New    Target Date 11/24/21      PT LONG TERM GOAL #2   Title Pt will increase gait velocity without AD to >/= 2.62 ft/sec without LOB    Time 8    Period Weeks    Status New    Target Date 11/24/21      PT LONG TERM GOAL #3   Title Pt will increase score on Mini-Best test by 5 points to indicate decreased falls risk    Time 8    Period Weeks    Status New    Target Date 11/24/21      PT LONG TERM GOAL #4   Title Pt will ambulate x 250' outside over uneven pavement, negotiate ramp, curb and 12 stairs with LRAD MOD I.    Time 8    Period Weeks    Status New     Target Date 11/24/21                   Plan - 10/08/21 1537     Clinical Impression Statement Skilled session continues to focus on self donning AFO vs foot up brace.  He is unable to don either completely independently and feel that AFO provides better support therefore would recommend continuing with AFO at this time.  Also continue to work on balance with emphasis on ankle strategy for improved ankle activation.    Personal Factors and Comorbidities Comorbidity 3+;Past/Current Experience;Social Background;Transportation    Comorbidities OA, h/o blood transfusion, h/o radiation therapy, macular degeneration, CVA, craniotomy > cranioplasty and resection of meningioma, R hemiparesis, focal seizures, urinary retention    Examination-Activity Limitations Locomotion Level;Stairs;Stand    Examination-Participation Restrictions Community Activity;Driving;Yard Work    Stability/Clinical Decision Making Evolving/Moderate complexity    Rehab Potential Good    PT Frequency 1x / week    PT Duration 8 weeks  PT Treatment/Interventions ADLs/Self Care Home Management;Aquatic Therapy;DME Instruction;Gait training;Stair training;Functional mobility training;Therapeutic activities;Therapeutic exercise;Balance training;Neuromuscular re-education;Patient/family education;Passive range of motion    PT Next Visit Plan No estim due to CA and seizures. add to HEP balance ex's as indicated; continue to work on self donning AFO as needed. gait with AFO, balance training and strengthening    PT Home Exercise Plan Access Code: [email or web address]    Consulted and Agree with Plan of Care Patient             Patient will benefit from skilled therapeutic intervention in order to improve the following deficits and impairments:  Abnormal gait, Decreased activity tolerance, Decreased balance, Decreased strength, Difficulty walking  Visit Diagnosis: Muscle weakness (generalized)  Foot drop, right  Unsteadiness on  feet  Other abnormalities of gait and mobility     Problem List Patient Active Problem List   Diagnosis Date Noted   Spasticity 09/11/2021   Urinary retention 07/17/2021   S/P resection of meningioma 06/26/2021   S/P craniotomy 06/21/2021   Focal seizures ([facility]) 05/06/2021   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[place]) 11/13/2018   Meningioma ([facility]) 10/29/2018   Other abnormalities of gait and mobility 09/17/2018   Acute cerebral infarction (HCC)    Thrombocytopenia (HCC)    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 ([place])    Acute blood loss anemia    Generalized OA    Gastroesophageal reflux disease    Seizure prophylaxis    Leukocytosis    Tumor 08/18/2018   Meningioma determined by biopsy of brain ([place] [facility]) 08/18/2018   MACULAR DEGENERATION 02/29/2008   ALLERGY 02/29/2008   Nonspecific (abnormal) findings on radiological and other examination of body structure 12/26/2007   NONSPECIFIC ABNORM FIND RAD&OTH EXAM LUNG FIELD 12/26/2007   GALLSTONES 10/01/2007   ELEVATION, TRANSAMINASE/LDH LEVELS 09/06/2007   HEPATITIS NOS 09/01/2007   ABDOMINAL PAIN, EPIGASTRIC 09/01/2007    Cameron Sprang, PT, MPT [facility] [address] [place], [place], [address] Phone: [phone]   Fax:  [phone] 10/08/21, 3:40 PM   Name: James Olsen MRN: [id number] Date of Birth: 02/04/1946

## 2021-10-11 ENCOUNTER — Encounter: Payer: Self-pay | Admitting: Physical Therapy

## 2021-10-11 ENCOUNTER — Other Ambulatory Visit: Payer: Self-pay

## 2021-10-11 ENCOUNTER — Ambulatory Visit: Payer: Medicare Other | Admitting: Physical Therapy

## 2021-10-11 DIAGNOSIS — R2681 Unsteadiness on feet: Secondary | ICD-10-CM

## 2021-10-11 DIAGNOSIS — M6281 Muscle weakness (generalized): Secondary | ICD-10-CM

## 2021-10-11 DIAGNOSIS — R2689 Other abnormalities of gait and mobility: Secondary | ICD-10-CM

## 2021-10-11 DIAGNOSIS — M21371 Foot drop, right foot: Secondary | ICD-10-CM

## 2021-10-11 NOTE — Therapy (Signed)
[facility] [address] [place], [place], [address] Phone: [phone]   Fax:  [phone]  Physical Therapy Treatment  Patient Details  Name: James Olsen MRN: [id number] Date of Birth: 1946-06-18 Referring Provider (PT): Meredith Staggers, MD   Encounter Date: 10/11/2021   PT End of Session - 10/11/21 1105     Visit Number 5    Number of Visits 17    Date for PT Re-Evaluation 11/24/21    Authorization Type Medicare and AARP - 10th visit PN and KX modifier required    PT Start Time 1103    PT Stop Time 1145    PT Time Calculation (min) 42 min    Equipment Utilized During Treatment Gait belt    Activity Tolerance Patient tolerated treatment well;No increased pain    Behavior During Therapy WFL for tasks assessed/performed             Past Medical History:  Diagnosis Date   Arthritis    GERD (gastroesophageal reflux disease)    Glaucoma    pt denies.    History of blood transfusion     5 units after previous surgery   History of radiation therapy 12/09/18- 01/24/2019   Left sagittal brain, 11.8 Gy in 31 fractions for a total dose of 55.8 Gy   Macular degeneration    Stroke [facility])    after surgery in August 2019 mostly recovered still some weakness in left arm   Wears glasses     Past Surgical History:  Procedure Laterality Date   APPLICATION OF CRANIAL NAVIGATION N/A 06/21/2021   Procedure: APPLICATION OF CRANIAL NAVIGATION;  Surgeon: [name], MD;  Location: [facility];  Service: Neurosurgery;  Laterality: N/A;   BRAIN SURGERY     CHOLECYSTECTOMY  2008   lap choli   COLONOSCOPY     CRANIOPLASTY N/A 10/29/2018   Procedure: CRANIOPLASTY;  Surgeon: [name], MD;  Location: [facility];  Service: Neurosurgery;  Laterality: N/A;  CRANIOPLASTY   CRANIOTOMY N/A 08/18/2018   Procedure: CRANIOTOMY TUMOR EXCISION;  Surgeon: [name], MD;  Location: [facility];  Service: Neurosurgery;  Laterality: N/A;   CRANIOTOMY TUMOR EXCISION   CRANIOTOMY Bilateral 06/21/2021   Procedure: Bilateral Craniotomy for tumor resection;  Surgeon: [name], MD;  Location: [facility];  Service: Neurosurgery;  Laterality: Bilateral;   DIAGNOSTIC LAPAROSCOPY     EYE SURGERY     laser surgery to relieve pressure   NAILBED REPAIR Left 05/04/2014   Procedure: LEFT INDEX NAIL ABLATION V-Y FLAP COVERAGE;  Surgeon: [name]., MD;  Location: [facility];  Service: Orthopedics;  Laterality: Left;  index   PILONIDAL CYST EXCISION     x2   TONSILLECTOMY     VASECTOMY      There were no vitals filed for this visit.   Subjective Assessment - 10/11/21 1104     Subjective No new complaints. No falls or pain to report. Continues to have issues with donning braces, did have the brace on with arrival at clinic today.    Pertinent History OA, h/o blood transfusion, h/o radiation therapy, macular degeneration, CVA, craniotomy > cranioplasty and resection of meningioma, R hemiparesis, focal seizures, urinary retention    Limitations Standing;Walking;House hold activities    Patient Stated Goals Walking, gain more strength in RLE to be able to drive in 6 months if seizure free    Currently in Pain? No/denies  [facility] PT Treatment/Exercise - 10/11/21 1107       Transfers   Transfers Sit to Stand;Stand to Sit    Sit to Stand 5: Supervision;With upper extremity assist;From bed;From chair/3-in-1    Stand to Sit 5: Supervision      Ambulation/Gait   Ambulation/Gait Yes    Assistive device Rolling walker;Other (Comment)    Gait Pattern Step-through pattern;Decreased step length - right;Decreased stride length;Decreased hip/knee flexion - right;Decreased dorsiflexion - right;Right steppage;Poor foot clearance - right    Ambulation Surface Level;Indoor      High Level Balance   High Level Balance Activities Side stepping;Marching forwards;Backward walking    High Level Balance  Comments in parallel bars with UE support: 4 laps each/each way with cues on form/technique with min guard to min assist.      Self-Care   Self-Care Other Self-Care Comments    Other Self-Care Comments  Reviewed current HEP. Pt performing at home with spouse with continued challenge with current program.      Neuro Re-ed    Neuro Re-ed Details  for strengthening/muscle re-ed: tall kneeing on mat table with UE support on Kaye bench- alternating UE raises, then upper trunk rotation for 5-6 reps each; Mini squats,then alternating moving knee out<>in, then alternating moving knee back<>forward for ~10 reps each with min guard assist for balance, cues/facilitation for posture/weight shifting.                 Balance Exercises - 10/11/21 1134       Balance Exercises: Standing   Standing Eyes Closed Foam/compliant surface;Narrow base of support (BOS);Wide (BOA);Head turns;Other reps (comment);30 secs;Limitations    Standing Eyes Closed Limitations on airex with no UE support: narrow base of support EC 30 sec's x 3 reps, then with wide base of support for EC for head movements left<>right, up<>down for 10 reps each. Min guard to min assist for balance with cues on posture/weight shifting to assist with balance.                  PT Short Term Goals - 09/25/21 1352       PT SHORT TERM GOAL #1   Title Pt will demonstrate independence with initial HEP    Time 4    Period Weeks    Status New    Target Date 10/25/21      PT SHORT TERM GOAL #2   Title Pt will demonstrate improved functional LE strength as indicated by ability to perform 8-9 reps of sit <> stand WITHOUT use of UE in 30 seconds    Baseline 10 in 30 seconds with UE use    Time 4    Period Weeks    Status New    Target Date 10/25/21      PT SHORT TERM GOAL #3   Title Pt will participate in further assessment of balance impairments with Mini Best test    Time 4    Period Weeks    Status New    Target Date  10/25/21      PT SHORT TERM GOAL #4   Title Pt will improve gait velocity with RW to >/= 2.62 ft/sec    Time 4    Period Weeks    Status New    Target Date 10/25/21      PT SHORT TERM GOAL #5   Title Pt will ambulate without AD x 115' over level indoor surfaces with supervision and no LOB  Time 4    Period Weeks    Status New    Target Date 10/25/21               PT Long Term Goals - 09/25/21 1357       PT LONG TERM GOAL #1   Title Pt will demonstrate independence with final HEP    Time 8    Period Weeks    Status New    Target Date 11/24/21      PT LONG TERM GOAL #2   Title Pt will increase gait velocity without AD to >/= 2.62 ft/sec without LOB    Time 8    Period Weeks    Status New    Target Date 11/24/21      PT LONG TERM GOAL #3   Title Pt will increase score on Mini-Best test by 5 points to indicate decreased falls risk    Time 8    Period Weeks    Status New    Target Date 11/24/21      PT LONG TERM GOAL #4   Title Pt will ambulate x 250' outside over uneven pavement, negotiate ramp, curb and 12 stairs with LRAD MOD I.    Time 8    Period Weeks    Status New    Target Date 11/24/21              10/11/21 1107  Plan  Clinical Impression Statement Today's skilled session initially addressed how HEP was going at home with no issues noted or reported. Remainder of session continued to focus on balance and strengthening with rest breaks as needed. The pt is making progress toward goals and should benefit from continued PT to progress toward unmet goals.  Personal Factors and Comorbidities Comorbidity 3+;Past/Current Experience;Social Background;Transportation  Comorbidities OA, h/o blood transfusion, h/o radiation therapy, macular degeneration, CVA, craniotomy > cranioplasty and resection of meningioma, R hemiparesis, focal seizures, urinary retention  Examination-Activity Limitations Locomotion Level;Stairs;Stand  Examination-Participation  Restrictions Community Activity;Driving;Yard Work  Pt will benefit from skilled therapeutic intervention in order to improve on the following deficits Abnormal gait;Decreased activity tolerance;Decreased balance;Decreased strength;Difficulty walking  Stability/Clinical Decision Making Evolving/Moderate complexity  Rehab Potential Good  PT Frequency 1x / week  PT Duration 8 weeks  PT Treatment/Interventions ADLs/Self Care Home Management;Aquatic Therapy;DME Instruction;Gait training;Stair training;Functional mobility training;Therapeutic activities;Therapeutic exercise;Balance training;Neuromuscular re-education;Patient/family education;Passive range of motion  PT Next Visit Plan No estim due to CA and seizures. add to HEP balance ex's as indicated; continue to work on self donning AFO as needed. gait with AFO, balance training and strengthening  PT Home Exercise Plan Access Code: [email or web address]  Consulted and Agree with Plan of Care Patient           Patient will benefit from skilled therapeutic intervention in order to improve the following deficits and impairments:  Abnormal gait, Decreased activity tolerance, Decreased balance, Decreased strength, Difficulty walking  Visit Diagnosis: Muscle weakness (generalized)  Foot drop, right  Unsteadiness on feet  Other abnormalities of gait and mobility     Problem List Patient Active Problem List   Diagnosis Date Noted   Spasticity 09/11/2021   Urinary retention 07/17/2021   S/P resection of meningioma 06/26/2021   S/P craniotomy 06/21/2021   Focal seizures ([facility]) 05/06/2021   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   Meningioma ([facility]) 10/29/2018   Other abnormalities of gait and mobility 09/17/2018   Acute cerebral infarction (HCC)    Thrombocytopenia ([facility])  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 ([facility])    Acute blood loss anemia    Generalized OA    Gastroesophageal reflux disease     Seizure prophylaxis    Leukocytosis    Tumor 08/18/2018   Meningioma determined by biopsy of brain ([facility]) 08/18/2018   MACULAR DEGENERATION 02/29/2008   ALLERGY 02/29/2008   Nonspecific (abnormal) findings on radiological and other examination of body structure 12/26/2007   NONSPECIFIC ABNORM FIND RAD&OTH EXAM LUNG FIELD 12/26/2007   GALLSTONES 10/01/2007   ELEVATION, TRANSAMINASE/LDH LEVELS 09/06/2007   HEPATITIS NOS 09/01/2007   ABDOMINAL PAIN, EPIGASTRIC 09/01/2007    Willow Ora, PTA 10/11/2021, 11:35 PM  [facility] [address] [place], [place], [address] Phone: [phone]   Fax:  [phone]  Name: James Olsen MRN: [id number] Date of Birth: [birth year]

## 2021-10-15 ENCOUNTER — Encounter: Payer: Self-pay | Admitting: Rehabilitation

## 2021-10-15 ENCOUNTER — Other Ambulatory Visit: Payer: Self-pay

## 2021-10-15 ENCOUNTER — Ambulatory Visit: Payer: Medicare Other | Admitting: Rehabilitation

## 2021-10-15 DIAGNOSIS — M6281 Muscle weakness (generalized): Secondary | ICD-10-CM | POA: Diagnosis not present

## 2021-10-15 DIAGNOSIS — M21371 Foot drop, right foot: Secondary | ICD-10-CM

## 2021-10-15 DIAGNOSIS — R2689 Other abnormalities of gait and mobility: Secondary | ICD-10-CM

## 2021-10-15 DIAGNOSIS — R2681 Unsteadiness on feet: Secondary | ICD-10-CM

## 2021-10-15 NOTE — Therapy (Signed)
[facility] [address] [place], [place], [address] Phone: [phone]   Fax:  [phone]  Physical Therapy Treatment  Patient Details  Name: James Olsen MRN: [id number] Date of Birth: 12/20/1946 Referring Provider (PT): Meredith Staggers, MD   Encounter Date: 10/15/2021   PT End of Session - 10/15/21 1424     Visit Number 6    Number of Visits 17    Date for PT Re-Evaluation 11/24/21    Authorization Type Medicare and AARP - 10th visit PN and KX modifier required    PT Start Time 1231    PT Stop Time 1315    PT Time Calculation (min) 44 min    Equipment Utilized During Treatment Gait belt    Activity Tolerance Patient tolerated treatment well;No increased pain    Behavior During Therapy WFL for tasks assessed/performed             Past Medical History:  Diagnosis Date   Arthritis    GERD (gastroesophageal reflux disease)    Glaucoma    pt denies.    History of blood transfusion     5 units after previous surgery   History of radiation therapy 12/09/18- 01/24/2019   Left sagittal brain, 11.8 Gy in 31 fractions for a total dose of 55.8 Gy   Macular degeneration    Stroke [facility])    after surgery in August 2019 mostly recovered still some weakness in left arm   Wears glasses     Past Surgical History:  Procedure Laterality Date   APPLICATION OF CRANIAL NAVIGATION N/A 06/21/2021   Procedure: APPLICATION OF CRANIAL NAVIGATION;  Surgeon: [name], MD;  Location: [facility];  Service: Neurosurgery;  Laterality: N/A;   BRAIN SURGERY     CHOLECYSTECTOMY  2008   lap choli   COLONOSCOPY     CRANIOPLASTY N/A 10/29/2018   Procedure: CRANIOPLASTY;  Surgeon: [name], MD;  Location: [facility] [place];  Service: Neurosurgery;  Laterality: N/A;  CRANIOPLASTY   CRANIOTOMY N/A 08/18/2018   Procedure: CRANIOTOMY TUMOR EXCISION;  Surgeon: [name], MD;  Location: [facility];  Service: Neurosurgery;  Laterality: N/A;   CRANIOTOMY TUMOR EXCISION   CRANIOTOMY Bilateral 06/21/2021   Procedure: Bilateral Craniotomy for tumor resection;  Surgeon: [name], MD;  Location: [facility];  Service: Neurosurgery;  Laterality: Bilateral;   DIAGNOSTIC LAPAROSCOPY     EYE SURGERY     laser surgery to relieve pressure   NAILBED REPAIR Left 05/04/2014   Procedure: LEFT INDEX NAIL ABLATION V-Y FLAP COVERAGE;  Surgeon: [name]., MD;  Location: [facility];  Service: Orthopedics;  Laterality: Left;  index   PILONIDAL CYST EXCISION     x2   TONSILLECTOMY     VASECTOMY      There were no vitals filed for this visit.   Subjective Assessment - 10/15/21 1234     Subjective Was tired after last session.    Pertinent History OA, h/o blood transfusion, h/o radiation therapy, macular degeneration, CVA, craniotomy > cranioplasty and resection of meningioma, R hemiparesis, focal seizures, urinary retention    Limitations Standing;Walking;House hold activities    Patient Stated Goals Walking, gain more strength in RLE to be able to drive in 6 months if seizure free    Currently in Pain? No/denies  [facility] Adult PT Treatment/Exercise - 10/15/21 1235       Transfers   Transfers Sit to Stand;Stand to Sit    Sit to Stand 5: Supervision;With upper extremity assist;From bed;From chair/3-in-1    Stand to Sit 5: Supervision      Ambulation/Gait   Ambulation/Gait Yes    Ambulation/Gait Assistance 5: Supervision;4: Min assist    Ambulation/Gait Assistance Details S with use of RW into and out of clinic.  Did trial of no AD from // bars to therapy mat with HHA (minA) with cues for upright posture and light facilitation for forward/lateral weight shift and cues for larger L step length.  Also trialed use of quad tip cane following NMR tasks.  Performed 50' with cues/demo on sequencingand technique moving from a 3 point to 2 point gait pattern. Performed R HHA  (decreasing support as he ambulated) with light cues and facilitation for upright posture (forward gaze) improved R lateral weight shift for larger L step length which he did great with!  Discussed that he is not ready to do this at home yet, but that's where we would like progress to next.  Pt verbalized understanding.    Ambulation Distance (Feet) 115 Feet   100' x2 (in/out clinic), 15' without AD   Assistive device Rolling walker;Straight cane;1 person hand held assist   quad tip cane   Gait Pattern Step-through pattern;Decreased step length - right;Decreased stride length;Decreased hip/knee flexion - right;Decreased dorsiflexion - right;Right steppage;Poor foot clearance - right    Ambulation Surface Level;Indoor      Neuro Re-ed    Neuro Re-ed Details  RLE NMR through forced use/WB tasks in tall keeling/half kneeling and quadruped (on mat on floor).  Pt able to transfer mat>floor and floor to mat at S to min/guard level with min cues for safety and technique.  Note that he transferred floor to mat without shoes on for ease of transfer.  While in floor, worked on tall kneeling squats-first with BUE support>single UE support>no UE support (limited range) x 5 reps each.  Worked on tall kneeling to L half kneeling transitions x 5 reps with R UE support on mat and LUE support on PTs shoulders and tactile cues at pelvis for improved hip extension.  In quadruped, worked on L and R Ambulance person x 5 reps on each side with cues for minimal sway when lifting LLE.  Pt with some difficulty lifting R foot, but did lift knee as able.  Pt needing multiple rest breaks and cues to breath throughout but tolerated well.  In standing in // bars: keeping RLE in stance on 4" step lifting LLE into march position x 10 reps with cues for improved forward weight shift over R stance leg.  Transitioned to tapping LLE to balance bubble x 10 reps (worked from single UE support to no UE support-min A), then tapping RLE to bubbles x 10  reps without UE support.  Note that forward hip flex is better than previous sessions with less circumduction noted.                       PT Short Term Goals - 09/25/21 1352       PT SHORT TERM GOAL #1   Title Pt will demonstrate independence with initial HEP    Time 4    Period Weeks    Status New    Target Date 10/25/21      PT SHORT TERM GOAL #2  Title Pt will demonstrate improved functional LE strength as indicated by ability to perform 8-9 reps of sit <> stand WITHOUT use of UE in 30 seconds    Baseline 10 in 30 seconds with UE use    Time 4    Period Weeks    Status New    Target Date 10/25/21      PT SHORT TERM GOAL #3   Title Pt will participate in further assessment of balance impairments with Mini Best test    Time 4    Period Weeks    Status New    Target Date 10/25/21      PT SHORT TERM GOAL #4   Title Pt will improve gait velocity with RW to >/= 2.62 ft/sec    Time 4    Period Weeks    Status New    Target Date 10/25/21      PT SHORT TERM GOAL #5   Title Pt will ambulate without AD x 115' over level indoor surfaces with supervision and no LOB    Time 4    Period Weeks    Status New    Target Date 10/25/21               PT Long Term Goals - 09/25/21 1357       PT LONG TERM GOAL #1   Title Pt will demonstrate independence with final HEP    Time 8    Period Weeks    Status New    Target Date 11/24/21      PT LONG TERM GOAL #2   Title Pt will increase gait velocity without AD to >/= 2.62 ft/sec without LOB    Time 8    Period Weeks    Status New    Target Date 11/24/21      PT LONG TERM GOAL #3   Title Pt will increase score on Mini-Best test by 5 points to indicate decreased falls risk    Time 8    Period Weeks    Status New    Target Date 11/24/21      PT LONG TERM GOAL #4   Title Pt will ambulate x 250' outside over uneven pavement, negotiate ramp, curb and 12 stairs with LRAD MOD I.    Time 8    Period Weeks     Status New    Target Date 11/24/21                   Plan - 10/15/21 1424     Clinical Impression Statement Skilled session focused on RLE NMR in forced use/WB through tall kneeling, half kneeling and quadruped as well as standing tasks.  Trialed use of quad tip cane at end of session at min A level.  Pt progressed to 2 point pattern and did very well clearing RLE.  Will continue to practice in sessions before pt is able to do at home.    Personal Factors and Comorbidities Comorbidity 3+;Past/Current Experience;Social Background;Transportation    Comorbidities OA, h/o blood transfusion, h/o radiation therapy, macular degeneration, CVA, craniotomy > cranioplasty and resection of meningioma, R hemiparesis, focal seizures, urinary retention    Examination-Activity Limitations Locomotion Level;Stairs;Stand    Examination-Participation Restrictions Community Activity;Driving;Yard Work    [employer] Evolving/Moderate complexity    Rehab Potential Good    PT Frequency 1x / week    PT Duration 8 weeks    PT Treatment/Interventions ADLs/Self Care Home Management;Aquatic Therapy;DME Instruction;Gait  training;Stair training;Functional mobility training;Therapeutic activities;Therapeutic exercise;Balance training;Neuromuscular re-education;Patient/family education;Passive range of motion    PT Next Visit Plan No estim due to CA and seizures. add to HEP balance ex's as indicated; continue to work on self donning AFO as needed. gait with AFO, balance training and strengthening    PT Home Exercise Plan Access Code: [email or web address]    Consulted and Agree with Plan of Care Patient             Patient will benefit from skilled therapeutic intervention in order to improve the following deficits and impairments:  Abnormal gait, Decreased activity tolerance, Decreased balance, Decreased strength, Difficulty walking  Visit Diagnosis: Muscle weakness (generalized)  Foot drop,  right  Unsteadiness on feet  Other abnormalities of gait and mobility     Problem List Patient Active Problem List   Diagnosis Date Noted   Spasticity 09/11/2021   Urinary retention 07/17/2021   S/P resection of meningioma 06/26/2021   S/P craniotomy 06/21/2021   Focal seizures ([facility]) 05/06/2021   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   Meningioma ([facility]) 10/29/2018   Other abnormalities of gait and mobility 09/17/2018   Acute cerebral infarction (HCC)    Thrombocytopenia (HCC)    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 ([place])    Acute blood loss anemia    Generalized OA    Gastroesophageal reflux disease    Seizure prophylaxis    Leukocytosis    Tumor 08/18/2018   Meningioma determined by biopsy of brain ([facility]) 08/18/2018   MACULAR DEGENERATION 02/29/2008   ALLERGY 02/29/2008   Nonspecific (abnormal) findings on radiological and other examination of body structure 12/26/2007   NONSPECIFIC ABNORM FIND RAD&OTH EXAM LUNG FIELD 12/26/2007   GALLSTONES 10/01/2007   ELEVATION, TRANSAMINASE/LDH LEVELS 09/06/2007   HEPATITIS NOS 09/01/2007   ABDOMINAL PAIN, EPIGASTRIC 09/01/2007    Cameron Sprang, PT, MPT [facility] [address] [place], [place], [address] Phone: [phone]   Fax:  [phone] 10/15/21, 2:29 PM   Name: MAXMILLIAN CARSEY MRN: [id number] Date of Birth: 01-Sep-1946

## 2021-10-17 ENCOUNTER — Encounter: Payer: Self-pay | Admitting: Rehabilitation

## 2021-10-17 ENCOUNTER — Ambulatory Visit: Payer: Medicare Other | Admitting: Rehabilitation

## 2021-10-17 ENCOUNTER — Other Ambulatory Visit: Payer: Self-pay

## 2021-10-17 DIAGNOSIS — M6281 Muscle weakness (generalized): Secondary | ICD-10-CM | POA: Diagnosis not present

## 2021-10-17 DIAGNOSIS — M21371 Foot drop, right foot: Secondary | ICD-10-CM

## 2021-10-17 DIAGNOSIS — R2689 Other abnormalities of gait and mobility: Secondary | ICD-10-CM

## 2021-10-17 DIAGNOSIS — R2681 Unsteadiness on feet: Secondary | ICD-10-CM

## 2021-10-17 NOTE — Therapy (Signed)
[facility] [address] [place], [place], [address] Phone: [phone]   Fax:  [phone]  Physical Therapy Treatment  Patient Details  Name: James Olsen MRN: [id number] Date of Birth: [birth year] Referring Provider (PT): Meredith Staggers, MD   Encounter Date: 10/17/2021   PT End of Session - 10/17/21 1417     Visit Number 7    Number of Visits 17    Date for PT Re-Evaluation 11/24/21    Authorization Type Medicare and AARP - 10th visit PN and KX modifier required    PT Start Time 1149    PT Stop Time 1230    PT Time Calculation (min) 41 min    Equipment Utilized During Treatment Gait belt    Activity Tolerance Patient tolerated treatment well;No increased pain    Behavior During Therapy WFL for tasks assessed/performed             Past Medical History:  Diagnosis Date   Arthritis    GERD (gastroesophageal reflux disease)    Glaucoma    pt denies.    History of blood transfusion     5 units after previous surgery   History of radiation therapy 12/09/18- 01/24/2019   Left sagittal brain, 11.8 Gy in 31 fractions for a total dose of 55.8 Gy   Macular degeneration    Stroke [facility])    after surgery in August 2019 mostly recovered still some weakness in left arm   Wears glasses     Past Surgical History:  Procedure Laterality Date   APPLICATION OF CRANIAL NAVIGATION N/A 06/21/2021   Procedure: APPLICATION OF CRANIAL NAVIGATION;  Surgeon: [name], MD;  Location: [facility];  Service: Neurosurgery;  Laterality: N/A;   BRAIN SURGERY     CHOLECYSTECTOMY  2008   lap choli   COLONOSCOPY     CRANIOPLASTY N/A 10/29/2018   Procedure: CRANIOPLASTY;  Surgeon: [name], MD;  Location: [place];  Service: Neurosurgery;  Laterality: N/A;  CRANIOPLASTY   CRANIOTOMY N/A 08/18/2018   Procedure: CRANIOTOMY TUMOR EXCISION;  Surgeon: [name], MD;  Location: [facility];  Service: Neurosurgery;  Laterality: N/A;   CRANIOTOMY TUMOR EXCISION   CRANIOTOMY Bilateral 06/21/2021   Procedure: Bilateral Craniotomy for tumor resection;  Surgeon: [name], MD;  Location: [facility];  Service: Neurosurgery;  Laterality: Bilateral;   DIAGNOSTIC LAPAROSCOPY     EYE SURGERY     laser surgery to relieve pressure   NAILBED REPAIR Left 05/04/2014   Procedure: LEFT INDEX NAIL ABLATION V-Y FLAP COVERAGE;  Surgeon: [name]., MD;  Location: [facility];  Service: Orthopedics;  Laterality: Left;  index   PILONIDAL CYST EXCISION     x2   TONSILLECTOMY     VASECTOMY      There were no vitals filed for this visit.   Subjective Assessment - 10/17/21 1215     Subjective Pt reports feeling tired after last session.    Pertinent History OA, h/o blood transfusion, h/o radiation therapy, macular degeneration, CVA, craniotomy > cranioplasty and resection of meningioma, R hemiparesis, focal seizures, urinary retention    Currently in Pain? No/denies                               [facility] Adult PT Treatment/Exercise - 10/17/21 1150       Transfers   Transfers Sit to Stand;Stand to Sit  Sit to Stand 5: Supervision;With upper extremity assist;From bed;From chair/3-in-1    Stand to Sit 5: Supervision      Ambulation/Gait   Ambulation/Gait Yes    Ambulation/Gait Assistance 4: Min guard;4: Min assist    Ambulation/Gait Assistance Details Continues to ambulate in/out of clinic with RW at mod I level.  Continued to address gait with quad tip cane in session x 230' (around track in therapy gym) at min A fading to min/guard level.  Min cues for forward gaze and improved sequencing and technique.  Note marked improvement during todays session with fluidity of gait and decreased scuffing R foot.  Then progressed to gait negotiating around obstacles with quad tip cane to better simulate home environmnet.  Pt does tend to slow gait speed however with continued practice, improved with speed and  safety going around obstacles.  Performed 15' of obstacles x 3 laps    Ambulation Distance (Feet) 230 Feet   plus obstacles   Assistive device Rolling walker;Straight cane;1 person hand held assist   quad tip   Gait Pattern Step-through pattern;Decreased step length - right;Decreased stride length;Decreased hip/knee flexion - right;Decreased dorsiflexion - right;Right steppage;Poor foot clearance - right    Ambulation Surface Level;Indoor    Ramp 4: Min assist    Ramp Details (indicate cue type and reason) Cues for sequencing and technique and improved weight shift over RLE    Curb 3: Mod assist    Curb Details (indicate cue type and reason) Limited by tone in RLE, cues for sequencing and technique.      Self-Care   Self-Care Other Self-Care Comments    Other Self-Care Comments  Had lengthy discussion at beginning of session regarding pt feeling down a few days ago.  He reports feeling down about not being able to go out as much and not seeing friends as much.  He reports wife is frail herself and is not able to ambulate very much either.  He reports that he used to do the shopping, but now she has to make several small trips.  He does report that wife suggested getting out and walking down driveway and into cul de sac when feeling this way.  He notes improvement in gait over gravel at home which did help improve mood.  PT discussed that they do make gas pedal adaptation to make gas pedal extension to the L foot.  Would like to revisit this is future sessions as PT does feel that he could do this and be more independent with driving.  Would have to know if he is cleared from MD to do so.  Also discussed that PT feels he is ready to purchase quad tip cane and looked online during session for options.  Recommended he begin to use at home at countertop for safety so that he may use counter if needed on opposite side.  Pt verbalized understanding.      Exercises   Exercises Knee/Hip      Knee/Hip  Exercises: Aerobic   Stepper Scifit stepper x 8 mins with BUEs/LEs at level 2 resistance for overall strengthening and to improve flexibility in RLE prior to gait training.                     PT Education - 10/17/21 1417     Education Details See self care section    Person(s) Educated Patient    Methods Explanation    Comprehension Verbalized understanding  PT Short Term Goals - 09/25/21 1352       PT SHORT TERM GOAL #1   Title Pt will demonstrate independence with initial HEP    Time 4    Period Weeks    Status New    Target Date 10/25/21      PT SHORT TERM GOAL #2   Title Pt will demonstrate improved functional LE strength as indicated by ability to perform 8-9 reps of sit <> stand WITHOUT use of UE in 30 seconds    Baseline 10 in 30 seconds with UE use    Time 4    Period Weeks    Status New    Target Date 10/25/21      PT SHORT TERM GOAL #3   Title Pt will participate in further assessment of balance impairments with Mini Best test    Time 4    Period Weeks    Status New    Target Date 10/25/21      PT SHORT TERM GOAL #4   Title Pt will improve gait velocity with RW to >/= 2.62 ft/sec    Time 4    Period Weeks    Status New    Target Date 10/25/21      PT SHORT TERM GOAL #5   Title Pt will ambulate without AD x 115' over level indoor surfaces with supervision and no LOB    Time 4    Period Weeks    Status New    Target Date 10/25/21               PT Long Term Goals - 09/25/21 1357       PT LONG TERM GOAL #1   Title Pt will demonstrate independence with final HEP    Time 8    Period Weeks    Status New    Target Date 11/24/21      PT LONG TERM GOAL #2   Title Pt will increase gait velocity without AD to >/= 2.62 ft/sec without LOB    Time 8    Period Weeks    Status New    Target Date 11/24/21      PT LONG TERM GOAL #3   Title Pt will increase score on Mini-Best test by 5 points to indicate decreased falls  risk    Time 8    Period Weeks    Status New    Target Date 11/24/21      PT LONG TERM GOAL #4   Title Pt will ambulate x 250' outside over uneven pavement, negotiate ramp, curb and 12 stairs with LRAD MOD I.    Time 8    Period Weeks    Status New    Target Date 11/24/21                   Plan - 10/17/21 1417     Clinical Impression Statement Skilled session focused on discussion on how to be more mobile at home and how to perhaps adapt driving so that he may drive with LLE.  Also focused on gait training with quad tip cane which is improved even from previous session.  Discussed that he could go ahead and purchase and work on at home along counter top for safety    Personal Factors and Comorbidities Comorbidity 3+;Past/Current Experience;Social Background;Transportation    Comorbidities OA, h/o blood transfusion, h/o radiation therapy, macular degeneration, CVA, craniotomy > cranioplasty and resection of meningioma, R hemiparesis,  focal seizures, urinary retention    Examination-Activity Limitations Locomotion Level;Stairs;Stand    Examination-Participation Restrictions Community Activity;Driving;Yard Work    [employer] officer Evolving/Moderate complexity    Rehab Potential Good    PT Frequency 1x / week    PT Duration 8 weeks    PT Treatment/Interventions ADLs/Self Care Home Management;Aquatic Therapy;DME Instruction;Gait training;Stair training;Functional mobility training;Therapeutic activities;Therapeutic exercise;Balance training;Neuromuscular re-education;Patient/family education;Passive range of motion    PT Next Visit Plan Goals due 11/4! No estim due to CA and seizures. add to HEP balance ex's as indicated; gait (indoors and outdoors), obstacles, stairs, curb, ramp with quad tip cane, balance training and strengthening    PT Home Exercise Plan Access Code: [email or web address]    Consulted and Agree with Plan of Care Patient             Patient will  benefit from skilled therapeutic intervention in order to improve the following deficits and impairments:  Abnormal gait, Decreased activity tolerance, Decreased balance, Decreased strength, Difficulty walking  Visit Diagnosis: Muscle weakness (generalized)  Foot drop, right  Unsteadiness on feet  Other abnormalities of gait and mobility     Problem List Patient Active Problem List   Diagnosis Date Noted   Spasticity 09/11/2021   Urinary retention 07/17/2021   S/P resection of meningioma 06/26/2021   S/P craniotomy 06/21/2021   Focal seizures ([place]) 05/06/2021   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   Meningioma ([place]) 10/29/2018   Other abnormalities of gait and mobility 09/17/2018   Acute cerebral infarction (HCC)    Thrombocytopenia (HCC)    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 ([place])    Acute blood loss anemia    Generalized OA    Gastroesophageal reflux disease    Seizure prophylaxis    Leukocytosis    Tumor 08/18/2018   Meningioma determined by biopsy of brain ([place]) 08/18/2018   MACULAR DEGENERATION 02/29/2008   ALLERGY 02/29/2008   Nonspecific (abnormal) findings on radiological and other examination of body structure 12/26/2007   NONSPECIFIC ABNORM FIND RAD&OTH EXAM LUNG FIELD 12/26/2007   GALLSTONES 10/01/2007   ELEVATION, TRANSAMINASE/LDH LEVELS 09/06/2007   HEPATITIS NOS 09/01/2007   ABDOMINAL PAIN, EPIGASTRIC 09/01/2007    Cameron Sprang, PT, MPT [facility] [address] [place], [place], [address] Phone: [phone]   Fax:  [phone] 10/17/21, 2:36 PM   Name: BAILY SERPE MRN: [id number] Date of Birth: 12/18/1946

## 2021-10-22 ENCOUNTER — Ambulatory Visit: Payer: Medicare Other | Attending: Physical Medicine & Rehabilitation

## 2021-10-22 ENCOUNTER — Other Ambulatory Visit: Payer: Self-pay

## 2021-10-22 ENCOUNTER — Other Ambulatory Visit: Payer: Self-pay | Admitting: Neurosurgery

## 2021-10-22 DIAGNOSIS — R2681 Unsteadiness on feet: Secondary | ICD-10-CM

## 2021-10-22 DIAGNOSIS — R2689 Other abnormalities of gait and mobility: Secondary | ICD-10-CM | POA: Diagnosis present

## 2021-10-22 DIAGNOSIS — M6281 Muscle weakness (generalized): Secondary | ICD-10-CM

## 2021-10-22 DIAGNOSIS — Z86018 Personal history of other benign neoplasm: Secondary | ICD-10-CM

## 2021-10-22 DIAGNOSIS — D329 Benign neoplasm of meninges, unspecified: Secondary | ICD-10-CM

## 2021-10-22 DIAGNOSIS — Z9889 Other specified postprocedural states: Secondary | ICD-10-CM | POA: Diagnosis present

## 2021-10-22 DIAGNOSIS — M21371 Foot drop, right foot: Secondary | ICD-10-CM

## 2021-10-22 NOTE — Therapy (Signed)
[facility] [address] [place], [place], [address] Phone: [phone]   Fax:  [phone]  Physical Therapy Treatment  Patient Details  Name: James Olsen MRN: [id number] Date of Birth: [birth year] Referring Provider (PT): Meredith Staggers, MD   Encounter Date: 10/22/2021   PT End of Session - 10/22/21 1234     Visit Number 8    Number of Visits 17    Date for PT Re-Evaluation 11/24/21    Authorization Type Medicare and AARP - 10th visit PN and KX modifier required    PT Start Time 1230    PT Stop Time 1315    PT Time Calculation (min) 45 min    Equipment Utilized During Treatment Gait belt    Activity Tolerance Patient tolerated treatment well;No increased pain    Behavior During Therapy WFL for tasks assessed/performed             Past Medical History:  Diagnosis Date   Arthritis    GERD (gastroesophageal reflux disease)    Glaucoma    pt denies.    History of blood transfusion     5 units after previous surgery   History of radiation therapy 12/09/18- 01/24/2019   Left sagittal brain, 11.8 Gy in 31 fractions for a total dose of 55.8 Gy   Macular degeneration    Stroke [facility])    after surgery in August 2019 mostly recovered still some weakness in left arm   Wears glasses     Past Surgical History:  Procedure Laterality Date   APPLICATION OF CRANIAL NAVIGATION N/A 06/21/2021   Procedure: APPLICATION OF CRANIAL NAVIGATION;  Surgeon: [name], MD;  Location: [facility];  Service: Neurosurgery;  Laterality: N/A;   BRAIN SURGERY     CHOLECYSTECTOMY  2008   lap choli   COLONOSCOPY     CRANIOPLASTY N/A 10/29/2018   Procedure: CRANIOPLASTY;  Surgeon: [name], MD;  Location: [facility];  Service: Neurosurgery;  Laterality: N/A;  CRANIOPLASTY   CRANIOTOMY N/A 08/18/2018   Procedure: CRANIOTOMY TUMOR EXCISION;  Surgeon: [name], MD;  Location: [facility];  Service: Neurosurgery;  Laterality: N/A;   CRANIOTOMY TUMOR EXCISION   CRANIOTOMY Bilateral 06/21/2021   Procedure: Bilateral Craniotomy for tumor resection;  Surgeon: [name], MD;  Location: [facility];  Service: Neurosurgery;  Laterality: Bilateral;   DIAGNOSTIC LAPAROSCOPY     EYE SURGERY     laser surgery to relieve pressure   NAILBED REPAIR Left 05/04/2014   Procedure: LEFT INDEX NAIL ABLATION V-Y FLAP COVERAGE;  Surgeon: [name]., MD;  Location: [facility];  Service: Orthopedics;  Laterality: Left;  index   PILONIDAL CYST EXCISION     x2   TONSILLECTOMY     VASECTOMY      There were no vitals filed for this visit.   Subjective Assessment - 10/22/21 1234     Subjective It is most difficult for me to pivot or turn sharp cornders around the house.    Pertinent History OA, h/o blood transfusion, h/o radiation therapy, macular degeneration, CVA, craniotomy > cranioplasty and resection of meningioma, R hemiparesis, focal seizures, urinary retention                Gait training: 1 x 230' with st. Cane with quad tip, cues to keep cane to left to prevent kicking it, pt has intermittent drag of R foot during R swing phase due to decreased R hip flexion during  swing phase, Pt has AFO on R foot. Box taps: quick taps with R LE: bil HHA: 3 x 10 Gait training: 1 x 230' st. Cane and CGA, horizontal and vertical head turns. Sit to stand: 3 x 5 , focused on quick getting up to work on fast twitch fibers. Step up and over in front and step up over in back: pt uses cane for HHA support and min to mod A from PT: attempted on black firm foam but too hard. Worked on 4" box: 2 x 3 R and L                            PT Short Term Goals - 09/25/21 1352       PT SHORT TERM GOAL #1   Title Pt will demonstrate independence with initial HEP    Time 4    Period Weeks    Status New    Target Date 10/25/21      PT SHORT TERM GOAL #2   Title Pt will demonstrate improved functional LE  strength as indicated by ability to perform 8-9 reps of sit <> stand WITHOUT use of UE in 30 seconds    Baseline 10 in 30 seconds with UE use    Time 4    Period Weeks    Status New    Target Date 10/25/21      PT SHORT TERM GOAL #3   Title Pt will participate in further assessment of balance impairments with Mini Best test    Time 4    Period Weeks    Status New    Target Date 10/25/21      PT SHORT TERM GOAL #4   Title Pt will improve gait velocity with RW to >/= 2.62 ft/sec    Time 4    Period Weeks    Status New    Target Date 10/25/21      PT SHORT TERM GOAL #5   Title Pt will ambulate without AD x 115' over level indoor surfaces with supervision and no LOB    Time 4    Period Weeks    Status New    Target Date 10/25/21               PT Long Term Goals - 09/25/21 1357       PT LONG TERM GOAL #1   Title Pt will demonstrate independence with final HEP    Time 8    Period Weeks    Status New    Target Date 11/24/21      PT LONG TERM GOAL #2   Title Pt will increase gait velocity without AD to >/= 2.62 ft/sec without LOB    Time 8    Period Weeks    Status New    Target Date 11/24/21      PT LONG TERM GOAL #3   Title Pt will increase score on Mini-Best test by 5 points to indicate decreased falls risk    Time 8    Period Weeks    Status New    Target Date 11/24/21      PT LONG TERM GOAL #4   Title Pt will ambulate x 250' outside over uneven pavement, negotiate ramp, curb and 12 stairs with LRAD MOD I.    Time 8    Period Weeks    Status New    Target Date 11/24/21  Plan - 10/22/21 1247     Clinical Impression Statement Skilled session focused on working on fast twich mm fibers with box tas and quick sit to stand. We continued to work on progressing gait training with walker with head turns and backward walking.    Personal Factors and Comorbidities Comorbidity 3+;Past/Current Experience;Social Background;Transportation     Comorbidities OA, h/o blood transfusion, h/o radiation therapy, macular degeneration, CVA, craniotomy > cranioplasty and resection of meningioma, R hemiparesis, focal seizures, urinary retention    Examination-Activity Limitations Locomotion Level;Stairs;Stand    Examination-Participation Restrictions Community Activity;Driving;Yard Work    [employer] officer Evolving/Moderate complexity    Rehab Potential Good    PT Frequency 1x / week    PT Duration 8 weeks    PT Treatment/Interventions ADLs/Self Care Home Management;Aquatic Therapy;DME Instruction;Gait training;Stair training;Functional mobility training;Therapeutic activities;Therapeutic exercise;Balance training;Neuromuscular re-education;Patient/family education;Passive range of motion    PT Next Visit Plan Goals due 11/4! No estim due to CA and seizures. add to HEP balance ex's as indicated; gait (indoors and outdoors), obstacles, stairs, curb, ramp with quad tip cane, balance training and strengthening    PT Home Exercise Plan Access Code: [email or web address]    Consulted and Agree with Plan of Care Patient             Patient will benefit from skilled therapeutic intervention in order to improve the following deficits and impairments:  Abnormal gait, Decreased activity tolerance, Decreased balance, Decreased strength, Difficulty walking  Visit Diagnosis: Muscle weakness (generalized)  Unsteadiness on feet  Other abnormalities of gait and mobility  S/P resection of meningioma  Foot drop, right     Problem List Patient Active Problem List   Diagnosis Date Noted   Spasticity 09/11/2021   Urinary retention 07/17/2021   S/P resection of meningioma 06/26/2021   S/P craniotomy 06/21/2021   Focal seizures ([place]) 05/06/2021   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   Meningioma ([place]) 10/29/2018   Other abnormalities of gait and mobility 09/17/2018   Acute cerebral infarction (HCC)    Thrombocytopenia (H[facility])     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 ([place])    Acute blood loss anemia    Generalized OA    Gastroesophageal reflux disease    Seizure prophylaxis    Leukocytosis    Tumor 08/18/2018   Meningioma determined by biopsy of brain ([place]) 08/18/2018   MACULAR DEGENERATION 02/29/2008   ALLERGY 02/29/2008   Nonspecific (abnormal) findings on radiological and other examination of body structure 12/26/2007   NONSPECIFIC ABNORM FIND RAD&OTH EXAM LUNG FIELD 12/26/2007   GALLSTONES 10/01/2007   ELEVATION, TRANSAMINASE/LDH LEVELS 09/06/2007   HEPATITIS NOS 09/01/2007   ABDOMINAL PAIN, EPIGASTRIC 09/01/2007    Kerrie Pleasure, PT 10/22/2021, 1:16 PM  [facility] [address] [place], [place], [address] Phone: [phone]   Fax:  [phone]  Name: KHAIDEN SEGRETO MRN: [id number] Date of Birth: 1946/12/18

## 2021-10-25 ENCOUNTER — Other Ambulatory Visit: Payer: Self-pay

## 2021-10-25 ENCOUNTER — Ambulatory Visit: Payer: Medicare Other | Admitting: Physical Therapy

## 2021-10-25 ENCOUNTER — Encounter: Payer: Self-pay | Admitting: Physical Therapy

## 2021-10-25 DIAGNOSIS — R2681 Unsteadiness on feet: Secondary | ICD-10-CM

## 2021-10-25 DIAGNOSIS — M6281 Muscle weakness (generalized): Secondary | ICD-10-CM | POA: Diagnosis not present

## 2021-10-25 DIAGNOSIS — R2689 Other abnormalities of gait and mobility: Secondary | ICD-10-CM

## 2021-10-25 DIAGNOSIS — M21371 Foot drop, right foot: Secondary | ICD-10-CM

## 2021-10-25 NOTE — Therapy (Signed)
[facility] [address] [place], [place], [address] Phone: [phone]   Fax:  [phone]  Physical Therapy Treatment  Patient Details  Name: James Olsen MRN: [id number] Date of Birth: Dec 17, 1946 Referring Provider (PT): Meredith Staggers, MD   Encounter Date: 10/25/2021   PT End of Session - 10/25/21 1550     Visit Number 9    Number of Visits 17    Date for PT Re-Evaluation 11/24/21    Authorization Type Medicare and AARP - 10th visit PN and KX modifier required    PT Start Time 1319    PT Stop Time 1402    PT Time Calculation (min) 43 min    Equipment Utilized During Treatment Gait belt;Other (comment)   R AFO   Activity Tolerance Patient tolerated treatment well;No increased pain    Behavior During Therapy WFL for tasks assessed/performed             Past Medical History:  Diagnosis Date   Arthritis    GERD (gastroesophageal reflux disease)    Glaucoma    pt denies.    History of blood transfusion     5 units after previous surgery   History of radiation therapy 12/09/18- 01/24/2019   Left sagittal brain, 11.8 Gy in 31 fractions for a total dose of 55.8 Gy   Macular degeneration    Stroke [facility])    after surgery in August 2019 mostly recovered still some weakness in left arm   Wears glasses     Past Surgical History:  Procedure Laterality Date   APPLICATION OF CRANIAL NAVIGATION N/A 06/21/2021   Procedure: APPLICATION OF CRANIAL NAVIGATION;  Surgeon: [name], MD;  Location: [facility];  Service: Neurosurgery;  Laterality: N/A;   BRAIN SURGERY     CHOLECYSTECTOMY  2008   lap choli   COLONOSCOPY     CRANIOPLASTY N/A 10/29/2018   Procedure: CRANIOPLASTY;  Surgeon: [name], MD;  Location: [facility] [facility];  Service: Neurosurgery;  Laterality: N/A;  CRANIOPLASTY   CRANIOTOMY N/A 08/18/2018   Procedure: CRANIOTOMY TUMOR EXCISION;  Surgeon: [name], MD;  Location: [facility];  Service: Neurosurgery;   Laterality: N/A;  CRANIOTOMY TUMOR EXCISION   CRANIOTOMY Bilateral 06/21/2021   Procedure: Bilateral Craniotomy for tumor resection;  Surgeon: [name], MD;  Location: [facility];  Service: Neurosurgery;  Laterality: Bilateral;   DIAGNOSTIC LAPAROSCOPY     EYE SURGERY     laser surgery to relieve pressure   NAILBED REPAIR Left 05/04/2014   Procedure: LEFT INDEX NAIL ABLATION V-Y FLAP COVERAGE;  Surgeon: [name]., MD;  Location: [facility];  Service: Orthopedics;  Laterality: Left;  index   PILONIDAL CYST EXCISION     x2   TONSILLECTOMY     VASECTOMY      There were no vitals filed for this visit.   Subjective Assessment - 10/25/21 1321     Subjective No new falls, complaints, or pain to report. Pt reports that HEP exercises have been going well "when I remember to do them." Pt has gotten straight cane with quad tip, but hasn't used it yet at home.    Pertinent History OA, h/o blood transfusion, h/o radiation therapy, macular degeneration, CVA, craniotomy > cranioplasty and resection of meningioma, R hemiparesis, focal seizures, urinary retention    Limitations Standing;Walking;House hold activities    Patient Stated Goals Walking, gain more strength in RLE to be able to drive in 6 months  if seizure free    Currently in Pain? No/denies                     [facility] Adult PT Treatment/Exercise - 10/25/21 1344       Transfers   Transfers Sit to Stand;Stand to Sit    Sit to Stand 5: Supervision;4: Min guard;Without upper extremity assist    Sit to Stand Details (indicate cue type and reason) Pt performed functional assesment of strength by performing sit<>stands from standard chair for 30 sec. Pt performed 8 sit<>stands with no UE support. Pt had one episode of plopping back onto chair rather than standing up fully that was not counted due to not a full stand and needed a few attempts to stand towards to end of time due to fatigue.    Stand to Sit 5:  Supervision      Ambulation/Gait   Ambulation/Gait Yes    Ambulation/Gait Assistance 4: Min guard;4: Min assist    Ambulation/Gait Assistance Details Pt was min guard with gait with SPC with quad tip during session. Pt required cues to increase hip/knee flexion during swing phase on R, and demonstrated more decreased R foot clearence with fatigue. Pt ambulated in/out of session with RW.    Ambulation Distance (Feet) 300 Feet   230 on track, and ~100 throughout session.   Assistive device Rolling walker;Straight cane   straight cane with quad tip and AFO on R LE   Gait Pattern Step-through pattern;Decreased step length - right;Decreased stride length;Decreased hip/knee flexion - right;Decreased dorsiflexion - right;Right steppage;Poor foot clearance - right    Ambulation Surface Indoor;Level    Gait velocity 1.9 ft/sec   with SPC with quad tip     Neuro Re-ed    Neuro Re-ed Details  for strengthening/neuro re-ed of R hip flexors: had pt tap 4" step with RLE and single UE support x10. Pt required min guard to perform and demonstrated decreased foot clearence on R.      Exercises   Exercises Other Exercises    Other Exercises  Had pt peform alternating standing marching and standing hip extension in // bars and added to HEP              Added the following exercises to pt's HEP   Standing Hip Extension with Counter Support - 1 x daily - 5 x weekly - 1 sets - 10 reps Standing Marches with Counter Support - 1 x daily - 5 x weekly - 1 sets - 10 reps    PT Education - 10/25/21 1421     Education Details Added exercises to HEP for hip strengthening    Person(s) Educated Patient    Methods Explanation;Demonstration;Handout    Comprehension Verbalized understanding;Returned demonstration                PT Short Term Goals - 10/25/21 1559       PT SHORT TERM GOAL #1   Title Pt will demonstrate independence with initial HEP    Baseline Pt reports compliance with HEP    Status  Achieved      PT SHORT TERM GOAL #2   Title Pt will demonstrate improved functional LE strength as indicated by ability to perform 8-9 reps of sit <> stand WITHOUT use of UE in 30 seconds    Baseline 10/25/2021 8 in 30 sec with no UE assist    Status Achieved      PT SHORT TERM GOAL #3  Title Pt will participate in further assessment of balance impairments with Mini Best test    Baseline Completed on 10/01/2021, pt scored 9 on Mini Best Test    Status Achieved      PT SHORT TERM GOAL #4   Title Pt will improve gait velocity with RW to >/= 2.62 ft/sec    Baseline 10/25/2021 1.9 ft/sec, improved just not to goal level    Status Partially Met      PT SHORT TERM GOAL #5   Title Pt will ambulate without AD x 115' over level indoor surfaces with supervision and no LOB    Baseline 10/25/2021 Pt ambulated 115' with straight tip cane with quad tip and min guard, have not been working on gait with no AD at this time in therapy    Status Partially Met                    PT Long Term Goals - 09/25/21 1357       PT LONG TERM GOAL #1   Title Pt will demonstrate independence with final HEP    Time 8    Period Weeks    Status New    Target Date 11/24/21      PT LONG TERM GOAL #2   Title Pt will increase gait velocity without AD to >/= 2.62 ft/sec without LOB    Time 8    Period Weeks    Status New    Target Date 11/24/21      PT LONG TERM GOAL #3   Title Pt will increase score on Mini-Best test by 5 points to indicate decreased falls risk    Time 8    Period Weeks    Status New    Target Date 11/24/21      PT LONG TERM GOAL #4   Title Pt will ambulate x 250' outside over uneven pavement, negotiate ramp, curb and 12 stairs with LRAD MOD I.    Time 8    Period Weeks    Status New    Target Date 11/24/21                   Plan - 10/25/21 1552     Clinical Impression Statement Skilled session focused on assessing STGs with goals partially to fully met.  Then  gait with straight cane with quad tip and strengthening of R hip to improve R foot clearence during gait. Pt has made improvements towards goals, and could continue to benefit from further skilled PT to address functional limitations.    Personal Factors and Comorbidities Comorbidity 3+;Past/Current Experience;Social Background;Transportation    Comorbidities OA, h/o blood transfusion, h/o radiation therapy, macular degeneration, CVA, craniotomy > cranioplasty and resection of meningioma, R hemiparesis, focal seizures, urinary retention    Examination-Activity Limitations Locomotion Level;Stairs;Stand    Examination-Participation Restrictions Community Activity;Driving;Yard Work    [employer] officer Evolving/Moderate complexity    Rehab Potential Good    PT Frequency 1x / week    PT Duration 8 weeks    PT Treatment/Interventions ADLs/Self Care Home Management;Aquatic Therapy;DME Instruction;Gait training;Stair training;Functional mobility training;Therapeutic activities;Therapeutic exercise;Balance training;Neuromuscular re-education;Patient/family education;Passive range of motion    PT Next Visit Plan No estim due to CA and seizures. 10th visit progress note due add to HEP balance ex's as indicated; gait (indoors and outdoors), obstacles, stairs, curb, ramp with quad tip cane, balance training and LE strengthening.    PT Home Exercise Plan Access Code: [email or web address]  Consulted and Agree with Plan of Care Patient             Patient will benefit from skilled therapeutic intervention in order to improve the following deficits and impairments:  Abnormal gait, Decreased activity tolerance, Decreased balance, Decreased strength, Difficulty walking  Visit Diagnosis: Muscle weakness (generalized)  Unsteadiness on feet  Other abnormalities of gait and mobility  Foot drop, right     Problem List Patient Active Problem List   Diagnosis Date Noted   Spasticity 09/11/2021    Urinary retention 07/17/2021   S/P resection of meningioma 06/26/2021   S/P craniotomy 06/21/2021   Focal seizures ([facility]) 05/06/2021   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   Meningioma ([facility]) 10/29/2018   Other abnormalities of gait and mobility 09/17/2018   Acute cerebral infarction (HCC)    Thrombocytopenia (HCC)    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 ([place])    Acute blood loss anemia    Generalized OA    Gastroesophageal reflux disease    Seizure prophylaxis    Leukocytosis    Tumor 08/18/2018   Meningioma determined by biopsy of brain ([place]) 08/18/2018   MACULAR DEGENERATION 02/29/2008   ALLERGY 02/29/2008   Nonspecific (abnormal) findings on radiological and other examination of body structure 12/26/2007   NONSPECIFIC ABNORM FIND RAD&OTH EXAM LUNG FIELD 12/26/2007   GALLSTONES 10/01/2007   ELEVATION, TRANSAMINASE/LDH LEVELS 09/06/2007   HEPATITIS NOS 09/01/2007   ABDOMINAL PAIN, EPIGASTRIC 09/01/2007    Rondel Baton, SPTA 10/25/2021, 4:12 PM  [facility] [address] [place], [place], [address] Phone: [phone]   Fax:  [phone]  Name: DONELLE BABA MRN: [id number] Date of Birth: 11-Oct-1946  This note has been reviewed and edited by supervising CI.   Willow Ora, PTA, [facility] [address] [place], [place] [address] [phone] 10/25/21, 7:53 PM

## 2021-10-25 NOTE — Patient Instructions (Signed)
Added the following exercises to pt's HEP  Standing Hip Extension with Counter Support - 1 x daily - 5 x weekly - 1 sets - 10 reps Standing Marches with Counter Support - 1 x daily - 5 x weekly - 1 sets - 10 reps

## 2021-10-29 ENCOUNTER — Other Ambulatory Visit: Payer: Self-pay | Admitting: Radiation Therapy

## 2021-10-29 ENCOUNTER — Ambulatory Visit: Payer: Medicare Other | Admitting: Rehabilitation

## 2021-10-29 ENCOUNTER — Encounter: Payer: Self-pay | Admitting: Rehabilitation

## 2021-10-29 ENCOUNTER — Other Ambulatory Visit: Payer: Self-pay

## 2021-10-29 DIAGNOSIS — M21371 Foot drop, right foot: Secondary | ICD-10-CM

## 2021-10-29 DIAGNOSIS — M6281 Muscle weakness (generalized): Secondary | ICD-10-CM | POA: Diagnosis not present

## 2021-10-29 DIAGNOSIS — R2681 Unsteadiness on feet: Secondary | ICD-10-CM

## 2021-10-29 DIAGNOSIS — R2689 Other abnormalities of gait and mobility: Secondary | ICD-10-CM

## 2021-10-29 NOTE — Therapy (Signed)
[facility] [address] [place], [place], [address] Phone: [phone]   Fax:  [phone]  Physical Therapy Treatment and Progress Note  Patient Details  Name: James Olsen MRN: [id number] Date of Birth: Nov 04, 1946 Referring Provider (PT): Meredith Staggers, MD   Encounter Date: 10/29/2021   PT End of Session - 10/29/21 1524     Visit Number 10    Number of Visits 17    Date for PT Re-Evaluation 11/24/21    Authorization Type Medicare and AARP - 10th visit PN and KX modifier required    PT Start Time 1318    PT Stop Time 1402    PT Time Calculation (min) 44 min    Equipment Utilized During Treatment Gait belt;Other (comment)   R AFO   Activity Tolerance Patient tolerated treatment well;No increased pain    Behavior During Therapy WFL for tasks assessed/performed             Past Medical History:  Diagnosis Date   Arthritis    GERD (gastroesophageal reflux disease)    Glaucoma    pt denies.    History of blood transfusion     5 units after previous surgery   History of radiation therapy 12/09/18- 01/24/2019   Left sagittal brain, 11.8 Gy in 31 fractions for a total dose of 55.8 Gy   Macular degeneration    Stroke [facility])    after surgery in August 2019 mostly recovered still some weakness in left arm   Wears glasses     Past Surgical History:  Procedure Laterality Date   APPLICATION OF CRANIAL NAVIGATION N/A 06/21/2021   Procedure: APPLICATION OF CRANIAL NAVIGATION;  Surgeon: [name], MD;  Location: [facility];  Service: Neurosurgery;  Laterality: N/A;   BRAIN SURGERY     CHOLECYSTECTOMY  2008   lap choli   COLONOSCOPY     CRANIOPLASTY N/A 10/29/2018   Procedure: CRANIOPLASTY;  Surgeon: [name], MD;  Location: [facility];  Service: Neurosurgery;  Laterality: N/A;  CRANIOPLASTY   CRANIOTOMY N/A 08/18/2018   Procedure: CRANIOTOMY TUMOR EXCISION;  Surgeon: [name], MD;  Location: [facility];   Service: Neurosurgery;  Laterality: N/A;  CRANIOTOMY TUMOR EXCISION   CRANIOTOMY Bilateral 06/21/2021   Procedure: Bilateral Craniotomy for tumor resection;  Surgeon: [name], MD;  Location: [facility];  Service: Neurosurgery;  Laterality: Bilateral;   DIAGNOSTIC LAPAROSCOPY     EYE SURGERY     laser surgery to relieve pressure   NAILBED REPAIR Left 05/04/2014   Procedure: LEFT INDEX NAIL ABLATION V-Y FLAP COVERAGE;  Surgeon: [name]., MD;  Location: [facility];  Service: Orthopedics;  Laterality: Left;  index   PILONIDAL CYST EXCISION     x2   TONSILLECTOMY     VASECTOMY      There were no vitals filed for this visit.   Subjective Assessment - 10/29/21 1321     Subjective Pt reports feeling like his R heal is "floating" today. Therapist worked with him to readjust foot placement and tightened shoe to secure R AFO. No new falls or pain to report this session.    Pertinent History OA, h/o blood transfusion, h/o radiation therapy, macular degeneration, CVA, craniotomy > cranioplasty and resection of meningioma, R hemiparesis, focal seizures, urinary retention    Limitations Standing;Walking;House hold activities    Patient Stated Goals Walking, gain more strength in RLE to be able to drive in 6 months if  seizure free    Currently in Pain? No/denies                               [facility] Adult PT Treatment/Exercise - 10/29/21 1323       Transfers   Transfers Sit to Stand;Stand to Sit    Sit to Stand 5: Supervision;With upper extremity assist    Sit to Stand Details (indicate cue type and reason) Pt performed sit<>stands throughout session. Towards the end of the session the pt required multiple attempts to stand, however was able to perform safely without added assistance.    Stand to Sit 5: Supervision      Ambulation/Gait   Ambulation/Gait Yes    Ambulation/Gait Assistance 5: Supervision;4: Min guard    Ambulation/Gait Assistance Details  Pt required supervision for ambulating in/out session with RW. Pt required min guard for ambulating 2 laps around track and throughout session with straight cane with quad tip. The pt demonstrated decreased foot clearence on the R which improved some with verbal cues. The pt also required several verbal cues to look forward rather than down at his feet while ambulating    Ambulation Distance (Feet) 300 Feet   2 laps around track and throughout session.   Assistive device Rolling walker;Straight cane   straight cane with quad tip   Gait Pattern Step-through pattern;Decreased step length - right;Decreased stride length;Decreased hip/knee flexion - right;Decreased dorsiflexion - right;Right steppage;Poor foot clearance - right    Ambulation Surface Level;Indoor    Stairs Yes    Stairs Assistance 4: Min guard    Stairs Assistance Details (indicate cue type and reason) Pt performed 2 sets of 4 steps ascending and descending with rail on right, straight cane with quad tip in left, and step-to pattern. When leading with the L LE while ascending the steps, the pt demonstrated frequent "catching" of R shoe when bringing up R LE, and required cues for increased knee/hip flexion to improve R foot clearence. When descending the steps, the pt lead with the R LE and required cues to maintain R UE in front of him on rail. The pt performed 1 set of 4 steps ascending/descending with the same use of cane/rails, and ascending with the RLE. The pt demonstrated mild ataxic movements when stepping with R LE, however demonstrated decreased "catching" of R foot on step.    Stair Management Technique One rail Right;With cane;Step to pattern;Forwards    Number of Stairs 4   Pt performed two sets of 4 steps ascending andn descending. After the pt rested, the pt performed another set of ascending/descending the stairs.   Height of Stairs 6      Neuro Re-ed    Neuro Re-ed Details  for strengthening/neuro re-ed: Pt placed in // bars  with 4 " step. Pt performed 1 set leading with each LE of step ups with 1 UE support. The pt demonstrated decreased intermittend decreased foot clearence on the R, requiring cues to improve hip/knee flexion. The pt then performed 1 set of step downs leading with each LE using 1 UE support. The pt demonstrated increased shaking in L LE due to fatigue when stepping down leading with R LE. The pt required intermitent B UE support to perform step downs.                       PT Short Term Goals - 10/25/21 1559  PT SHORT TERM GOAL #1   Title Pt will demonstrate independence with initial HEP    Baseline Pt reports compliance with HEP    Status Achieved      PT SHORT TERM GOAL #2   Title Pt will demonstrate improved functional LE strength as indicated by ability to perform 8-9 reps of sit <> stand WITHOUT use of UE in 30 seconds    Baseline 10/25/2021 8 in 30 sec with no UE assist    Status Achieved      PT SHORT TERM GOAL #3   Title Pt will participate in further assessment of balance impairments with Mini Best test    Baseline Completed on 10/01/2021, pt scored 9 on Mini Best Test    Status Achieved      PT SHORT TERM GOAL #4   Title Pt will improve gait velocity with RW to >/= 2.62 ft/sec    Baseline 10/25/2021 1.9 ft/sec, improved just not to goal level    Status Partially Met      PT SHORT TERM GOAL #5   Title Pt will ambulate without AD x 115' over level indoor surfaces with supervision and no LOB    Baseline 10/25/2021 Pt ambulated 115' with straight tip cane with quad tip and min guard, have not been working on gait with no AD at this time in therapy    Status Partially Met               PT Long Term Goals - 09/25/21 1357       PT LONG TERM GOAL #1   Title Pt will demonstrate independence with final HEP    Time 8    Period Weeks    Status New    Target Date 11/24/21      PT LONG TERM GOAL #2   Title Pt will increase gait velocity without AD to >/=  2.62 ft/sec without LOB    Time 8    Period Weeks    Status New    Target Date 11/24/21      PT LONG TERM GOAL #3   Title Pt will increase score on Mini-Best test by 5 points to indicate decreased falls risk    Time 8    Period Weeks    Status New    Target Date 11/24/21      PT LONG TERM GOAL #4   Title Pt will ambulate x 250' outside over uneven pavement, negotiate ramp, curb and 12 stairs with LRAD MOD I.    Time 8    Period Weeks    Status New    Target Date 11/24/21             Progress Note Reporting Period 09/24/21 to 10/29/21  See note below for Objective Data and Assessment of Progress/Goals.    Note that STGs were assessed at last visit. He has met 3/5 goals, partially meeting remaining goals.    Progress note completed by:  Cameron Sprang, PT, MPT [facility] [address] [place], [place], [address] Phone: [phone]   Fax:  [phone] 10/29/21, 3:40 PM     Plan - 10/29/21 1525     Clinical Impression Statement Today's skilled session was focused on continued gait training with straight can with quad tip and strengthening of LE's to improve gait. Also worked on navigating stairs with 1 handrail and straight cane with quat tip. The pt could continue to benefit from further skilled PT  to address all functional limitations.    Personal Factors and Comorbidities Comorbidity 3+;Past/Current Experience;Social Background;Transportation    Comorbidities OA, h/o blood transfusion, h/o radiation therapy, macular degeneration, CVA, craniotomy > cranioplasty and resection of meningioma, R hemiparesis, focal seizures, urinary retention    Examination-Activity Limitations Locomotion Level;Stairs;Stand    Examination-Participation Restrictions Community Activity;Driving;Yard Work    [employer] officer Evolving/Moderate complexity    Rehab Potential Good    PT Frequency 1x / week    PT Duration 8 weeks    PT  Treatment/Interventions ADLs/Self Care Home Management;Aquatic Therapy;DME Instruction;Gait training;Stair training;Functional mobility training;Therapeutic activities;Therapeutic exercise;Balance training;Neuromuscular re-education;Patient/family education;Passive range of motion    PT Next Visit Plan No estim due to CA and seizures. add to HEP balance ex's as indicated; gait (indoors and outdoors), obstacles, stairs, curb, ramp with quad tip cane, balance training and LE strengthening.    PT Home Exercise Plan Access Code: [email or web address]    Consulted and Agree with Plan of Care Patient             Patient will benefit from skilled therapeutic intervention in order to improve the following deficits and impairments:  Abnormal gait, Decreased activity tolerance, Decreased balance, Decreased strength, Difficulty walking  Visit Diagnosis: Muscle weakness (generalized)  Unsteadiness on feet  Other abnormalities of gait and mobility  Foot drop, right     Problem List Patient Active Problem List   Diagnosis Date Noted   Spasticity 09/11/2021   Urinary retention 07/17/2021   S/P resection of meningioma 06/26/2021   S/P craniotomy 06/21/2021   Focal seizures ([place] [place]) 05/06/2021   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[place]) 11/13/2018   Meningioma ([place]) 10/29/2018   Other abnormalities of gait and mobility 09/17/2018   Acute cerebral infarction (HCC)    Thrombocytopenia (HCC)    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 ([place])    Acute blood loss anemia    Generalized OA    Gastroesophageal reflux disease    Seizure prophylaxis    Leukocytosis    Tumor 08/18/2018   Meningioma determined by biopsy of brain ([place]) 08/18/2018   MACULAR DEGENERATION 02/29/2008   ALLERGY 02/29/2008   Nonspecific (abnormal) findings on radiological and other examination of body structure 12/26/2007   NONSPECIFIC ABNORM FIND RAD&OTH EXAM LUNG FIELD 12/26/2007   GALLSTONES 10/01/2007    ELEVATION, TRANSAMINASE/LDH LEVELS 09/06/2007   HEPATITIS NOS 09/01/2007   ABDOMINAL PAIN, EPIGASTRIC 09/01/2007    Rondel Baton, SPTA 10/29/2021, 3:32 PM  [facility] [address] [place], [place], [address] Phone: [phone]   Fax:  [phone]  Name: James Olsen MRN: [id number] Date of Birth: 29-Jan-1946

## 2021-10-31 ENCOUNTER — Ambulatory Visit: Payer: Medicare Other | Admitting: Rehabilitation

## 2021-11-03 ENCOUNTER — Other Ambulatory Visit: Payer: Self-pay | Admitting: Physical Medicine & Rehabilitation

## 2021-11-04 ENCOUNTER — Ambulatory Visit: Payer: Medicare Other | Admitting: Physical Therapy

## 2021-11-04 ENCOUNTER — Encounter: Payer: Self-pay | Admitting: Physical Therapy

## 2021-11-04 ENCOUNTER — Other Ambulatory Visit: Payer: Self-pay

## 2021-11-04 DIAGNOSIS — R2689 Other abnormalities of gait and mobility: Secondary | ICD-10-CM

## 2021-11-04 DIAGNOSIS — R2681 Unsteadiness on feet: Secondary | ICD-10-CM

## 2021-11-04 DIAGNOSIS — M21371 Foot drop, right foot: Secondary | ICD-10-CM

## 2021-11-04 DIAGNOSIS — M6281 Muscle weakness (generalized): Secondary | ICD-10-CM | POA: Diagnosis not present

## 2021-11-04 NOTE — Therapy (Signed)
[facility] [address] [place], [place], [address] Phone: [phone]   Fax:  [phone]  Physical Therapy Treatment  Patient Details  Name: James Olsen MRN: [id number] Date of Birth: [birth year] Referring Provider (PT): Meredith Staggers, MD   Encounter Date: 11/04/2021   PT End of Session - 11/04/21 1202     Visit Number 11    Number of Visits 17    Date for PT Re-Evaluation 11/24/21    Authorization Type Medicare and [place] - 10th visit PN and KX modifier required    PT Start Time 1100    PT Stop Time 1144    PT Time Calculation (min) 44 min    Activity Tolerance Patient tolerated treatment well    Behavior During Therapy [facility] for tasks assessed/performed             Past Medical History:  Diagnosis Date   Arthritis    GERD (gastroesophageal reflux disease)    Glaucoma    pt denies.    History of blood transfusion     5 units after previous surgery   History of radiation therapy 12/09/18- 01/24/2019   Left sagittal brain, 11.8 Gy in 31 fractions for a total dose of 55.8 Gy   Macular degeneration    Stroke [facility])    after surgery in August 2019 mostly recovered still some weakness in left arm   Wears glasses     Past Surgical History:  Procedure Laterality Date   APPLICATION OF CRANIAL NAVIGATION N/A 06/21/2021   Procedure: APPLICATION OF CRANIAL NAVIGATION;  Surgeon: [name], MD;  Location: [place];  Service: Neurosurgery;  Laterality: N/A;   BRAIN SURGERY     CHOLECYSTECTOMY  2008   lap choli   COLONOSCOPY     CRANIOPLASTY N/A 10/29/2018   Procedure: CRANIOPLASTY;  Surgeon: [name], MD;  Location: [place];  Service: Neurosurgery;  Laterality: N/A;  CRANIOPLASTY   CRANIOTOMY N/A 08/18/2018   Procedure: CRANIOTOMY TUMOR EXCISION;  Surgeon: [name], MD;  Location: [place];  Service: Neurosurgery;  Laterality: N/A;  CRANIOTOMY TUMOR EXCISION   CRANIOTOMY Bilateral 06/21/2021    Procedure: Bilateral Craniotomy for tumor resection;  Surgeon: [name], MD;  Location: [place];  Service: Neurosurgery;  Laterality: Bilateral;   DIAGNOSTIC LAPAROSCOPY     EYE SURGERY     laser surgery to relieve pressure   NAILBED REPAIR Left 05/04/2014   Procedure: LEFT INDEX NAIL ABLATION V-Y FLAP COVERAGE;  Surgeon: [name]., MD;  Location: [place];  Service: Orthopedics;  Laterality: Left;  index   PILONIDAL CYST EXCISION     x2   TONSILLECTOMY     VASECTOMY      There were no vitals filed for this visit.   Subjective Assessment - 11/04/21 1108     Subjective Using cane at home when walking next to the counter; still feels wobbly with cane.  Still having an issue with the AFO moving and feeling loose.  Foot feels numb and heavy when wearing AFO: not sure if the brace strap is too tight.    Pertinent History OA, h/o blood transfusion, h/o radiation therapy, macular degeneration, CVA, craniotomy > cranioplasty and resection of meningioma, R hemiparesis, focal seizures, urinary retention    Limitations Standing;Walking;House hold activities    Patient Stated Goals Walking, gain more strength in RLE to be able to drive in 6 months if seizure free    Currently in  Pain? No/denies               [facility] Adult PT Treatment/Exercise - 11/04/21 1119       Transfers   Transfers Sit to Stand;Stand to Sit    Sit to Stand 5: Supervision    Sit to Stand Details (indicate cue type and reason) Multiple attempts to stand due to LOB posteriorly.    Stand to Sit 5: Supervision      Ambulation/Gait   Ambulation/Gait Yes    Ambulation/Gait Assistance 5: Supervision    Ambulation/Gait Assistance Details performed one lap with heel wedge and RW with pt reporting no feeling of heel "floating"; still feels some movement of ankle in shoe but this is likely due to size and depth of shoe.  Also performed one lap with cane with heel wedge with pt still requiring  increased attention (visual and cognitive) to ambulation.    Ambulation Distance (Feet) 115 Feet    Assistive device Rolling walker;Straight cane    Gait Pattern Step-through pattern;Decreased arm swing - right;Decreased stride length;Decreased hip/knee flexion - right;Decreased dorsiflexion - right;Poor foot clearance - right    Ambulation Surface Level;Indoor    Pre-Gait Activities Assessed fit of AFO in shoe; added small heel wedge under AFO to stabilize in shoe and decrease movement of AFO in the shoe      High Level Balance   High Level Balance Activities Figure 8 turns;Negotiating over obstacles    High Level Balance Comments with cones on floor, using straight cane; performed 2 sets x 3 laps with min A.  First set did with sustained attention on figure 8 turns; second set added cognitive divided attention - food/holiday naming task with pt noted to have slower gait, shorter step length and increased foot drag.  Stepping over obstacles - 2 taller and 3 shorter - focused on leading with RLE.  Began with patient stopping and then stepping over with RLE with cane and HHA > able to progress to initiating R clearance over lower obstacle without stopping and without HHA.  Unable to perform without stopping on taller obstacle.  Attempted to step over leading with LLE but caught toes on obstacle when following with RLE.               PT Education - 11/04/21 1153     Education Details importance of dual task training for gait and balance, purpose of heel wedge    Person(s) Educated Patient    Methods Explanation    Comprehension Verbalized understanding              PT Short Term Goals - 10/25/21 1559       PT SHORT TERM GOAL #1   Title Pt will demonstrate independence with initial HEP    Baseline Pt reports compliance with HEP    Status Achieved      PT SHORT TERM GOAL #2   Title Pt will demonstrate improved functional LE strength as indicated by ability to perform 8-9 reps of  sit <> stand WITHOUT use of UE in 30 seconds    Baseline 10/25/2021 8 in 30 sec with no UE assist    Status Achieved      PT SHORT TERM GOAL #3   Title Pt will participate in further assessment of balance impairments with Mini Best test    Baseline Completed on 10/01/2021, pt scored 9 on Mini Best Test    Status Achieved      PT SHORT TERM  GOAL #4   Title Pt will improve gait velocity with RW to >/= 2.62 ft/sec    Baseline 10/25/2021 1.9 ft/sec, improved just not to goal level    Status Partially Met      PT SHORT TERM GOAL #5   Title Pt will ambulate without AD x 115' over level indoor surfaces with supervision and no LOB    Baseline 10/25/2021 Pt ambulated 115' with straight tip cane with quad tip and min guard, have not been working on gait with no AD at this time in therapy    Status Partially Met               PT Long Term Goals - 09/25/21 1357       PT LONG TERM GOAL #1   Title Pt will demonstrate independence with final HEP    Time 8    Period Weeks    Status New    Target Date 11/24/21      PT LONG TERM GOAL #2   Title Pt will increase gait velocity without AD to >/= 2.62 ft/sec without LOB    Time 8    Period Weeks    Status New    Target Date 11/24/21      PT LONG TERM GOAL #3   Title Pt will increase score on Mini-Best test by 5 points to indicate decreased falls risk    Time 8    Period Weeks    Status New    Target Date 11/24/21      PT LONG TERM GOAL #4   Title Pt will ambulate x 250' outside over uneven pavement, negotiate ramp, curb and 12 stairs with LRAD MOD I.    Time 8    Period Weeks    Status New    Target Date 11/24/21                   Plan - 11/04/21 1159     Clinical Impression Statement Continued to address fit and stability of AFO to provide greater stability of RLE in stance phase.  Once addressed, continued to perform higher level gait training with cane introducing dual task, turns and stepping over obstacles.   Requires min-mod A to prevent a fall.  Will continue to address and progress towards LTG.    Personal Factors and Comorbidities Comorbidity 3+;Past/Current Experience;Social Background;Transportation    Comorbidities OA, h/o blood transfusion, h/o radiation therapy, macular degeneration, CVA, craniotomy > cranioplasty and resection of meningioma, R hemiparesis, focal seizures, urinary retention    Examination-Activity Limitations Locomotion Level;Stairs;Stand    Examination-Participation Restrictions Community Activity;Driving;Yard Work    [employer] officer Evolving/Moderate complexity    Rehab Potential Good    PT Frequency 1x / week    PT Duration 8 weeks    PT Treatment/Interventions ADLs/Self Care Home Management;Aquatic Therapy;DME Instruction;Gait training;Stair training;Functional mobility training;Therapeutic activities;Therapeutic exercise;Balance training;Neuromuscular re-education;Patient/family education;Passive range of motion    PT Next Visit Plan No estim due to CA and seizures. gait with cane with quad tip (indoors and outdoors), obstacles, stairs, curb, ramp,  balance training and LE strengthening.    PT Home Exercise Plan Access Code: [email or web address]    Consulted and Agree with Plan of Care Patient             Patient will benefit from skilled therapeutic intervention in order to improve the following deficits and impairments:  Abnormal gait, Decreased activity tolerance, Decreased balance, Decreased strength, Difficulty walking  Visit Diagnosis:  Unsteadiness on feet  Muscle weakness (generalized)  Foot drop, right  Other abnormalities of gait and mobility     Problem List Patient Active Problem List   Diagnosis Date Noted   Spasticity 09/11/2021   Urinary retention 07/17/2021   S/P resection of meningioma 06/26/2021   S/P craniotomy 06/21/2021   Focal seizures ([place]) 05/06/2021   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   Meningioma ([place])  10/29/2018   Other abnormalities of gait and mobility 09/17/2018   Acute cerebral infarction (HCC)    Thrombocytopenia (HCC)    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 ([place])    Acute blood loss anemia    Generalized OA    Gastroesophageal reflux disease    Seizure prophylaxis    Leukocytosis    Tumor 08/18/2018   Meningioma determined by biopsy of brain ([place]) 08/18/2018   MACULAR DEGENERATION 02/29/2008   ALLERGY 02/29/2008   Nonspecific (abnormal) findings on radiological and other examination of body structure 12/26/2007   NONSPECIFIC ABNORM FIND RAD&OTH EXAM LUNG FIELD 12/26/2007   GALLSTONES 10/01/2007   ELEVATION, TRANSAMINASE/LDH LEVELS 09/06/2007   HEPATITIS NOS 09/01/2007   ABDOMINAL PAIN, EPIGASTRIC 09/01/2007    Rico Junker, PT, DPT 11/04/21    12:03 PM    [facility] [address] [place], [place], [address] Phone: [phone]   Fax:  [phone]  Name: ODYSSEUS CADA MRN: [id number] Date of Birth: 1946-02-18

## 2021-11-05 ENCOUNTER — Inpatient Hospital Stay: Payer: Medicare Other | Admitting: Internal Medicine

## 2021-11-07 ENCOUNTER — Ambulatory Visit: Payer: Medicare Other | Admitting: Rehabilitation

## 2021-11-07 ENCOUNTER — Encounter: Payer: Self-pay | Admitting: Rehabilitation

## 2021-11-07 ENCOUNTER — Other Ambulatory Visit: Payer: Self-pay

## 2021-11-07 DIAGNOSIS — M6281 Muscle weakness (generalized): Secondary | ICD-10-CM | POA: Diagnosis not present

## 2021-11-07 DIAGNOSIS — M21371 Foot drop, right foot: Secondary | ICD-10-CM

## 2021-11-07 DIAGNOSIS — R2681 Unsteadiness on feet: Secondary | ICD-10-CM

## 2021-11-07 DIAGNOSIS — R2689 Other abnormalities of gait and mobility: Secondary | ICD-10-CM

## 2021-11-07 NOTE — Therapy (Signed)
[facility] [address] [place], [place], [address] Phone: [phone]   Fax:  [phone]  Physical Therapy Treatment  Patient Details  Name: James Olsen MRN: [id number] Date of Birth: 10-19-1946 Referring Provider (PT): Meredith Staggers, MD   Encounter Date: 11/07/2021   PT End of Session - 11/07/21 1330     Visit Number 12    Number of Visits 17    Date for PT Re-Evaluation 11/24/21    Authorization Type Medicare and [place] - 10th visit PN and KX modifier required    PT Start Time 1104    PT Stop Time 1145    PT Time Calculation (min) 41 min    Activity Tolerance Patient tolerated treatment well    Behavior During Therapy [facility] for tasks assessed/performed             Past Medical History:  Diagnosis Date   Arthritis    GERD (gastroesophageal reflux disease)    Glaucoma    pt denies.    History of blood transfusion     5 units after previous surgery   History of radiation therapy 12/09/18- 01/24/2019   Left sagittal brain, 11.8 Gy in 31 fractions for a total dose of 55.8 Gy   Macular degeneration    Stroke [facility])    after surgery in August 2019 mostly recovered still some weakness in left arm   Wears glasses     Past Surgical History:  Procedure Laterality Date   APPLICATION OF CRANIAL NAVIGATION N/A 06/21/2021   Procedure: APPLICATION OF CRANIAL NAVIGATION;  Surgeon: [name], MD;  Location: [place];  Service: Neurosurgery;  Laterality: N/A;   BRAIN SURGERY     CHOLECYSTECTOMY  2008   lap choli   COLONOSCOPY     CRANIOPLASTY N/A 10/29/2018   Procedure: CRANIOPLASTY;  Surgeon: [name], MD;  Location: [place];  Service: Neurosurgery;  Laterality: N/A;  CRANIOPLASTY   CRANIOTOMY N/A 08/18/2018   Procedure: CRANIOTOMY TUMOR EXCISION;  Surgeon: [name], MD;  Location: [place];  Service: Neurosurgery;  Laterality: N/A;  CRANIOTOMY TUMOR EXCISION   CRANIOTOMY Bilateral 06/21/2021    Procedure: Bilateral Craniotomy for tumor resection;  Surgeon: [name], MD;  Location: [place];  Service: Neurosurgery;  Laterality: Bilateral;   DIAGNOSTIC LAPAROSCOPY     EYE SURGERY     laser surgery to relieve pressure   NAILBED REPAIR Left 05/04/2014   Procedure: LEFT INDEX NAIL ABLATION V-Y FLAP COVERAGE;  Surgeon: [name]., MD;  Location: [place];  Service: Orthopedics;  Laterality: Left;  index   PILONIDAL CYST EXCISION     x2   TONSILLECTOMY     VASECTOMY      There were no vitals filed for this visit.   Subjective Assessment - 11/07/21 1319     Subjective Reports that heel wedge was causing knee pain, therefore removed. He still feels that heel is floating in shoe slightly, but is able to continue without pain.    Pertinent History OA, h/o blood transfusion, h/o radiation therapy, macular degeneration, CVA, craniotomy > cranioplasty and resection of meningioma, R hemiparesis, focal seizures, urinary retention    Patient Stated Goals Walking, gain more strength in RLE to be able to drive in 6 months if seizure free    Currently in Pain? No/denies  [facility] Adult PT Treatment/Exercise - 11/07/21 1115       Transfers   Transfers Sit to Stand;Stand to Sit    Sit to Stand 5: Supervision    Sit to Stand Details Tactile cues for placement;Verbal cues for sequencing;Verbal cues for technique    Stand to Sit 5: Supervision      Ambulation/Gait   Ambulation/Gait Yes    Ambulation/Gait Assistance 4: Min assist    Ambulation/Gait Assistance Details High intensity gait training performed with 10lbs added to R LE for improved R swing during gait, also working to increase speed to improve forward propulsion during gait.  Performed 230' at min A level without AD but R HHA from PT.  Pt with mild scuffing of RLE however with intrinsic feedback was able to make less errors as he continued.  Gait speed prior to  high intensity gait was 2.35 ft/sec and then 3.06 ft/sec following training, both without device and light HHA    Ambulation Distance (Feet) 230 Feet    Assistive device 1 person hand held assist    Gait Pattern Step-through pattern;Decreased arm swing - right;Decreased stride length;Decreased hip/knee flexion - right;Decreased dorsiflexion - right;Poor foot clearance - right    Ambulation Surface Level;Indoor    Gait velocity --   see above     Neuro Re-ed    Neuro Re-ed Details  High level stepping strategy with 10lbs on RLE to carryover to improved swing during gait stepping to targets called out by PT in varying directions including crossing midline.  Min to mod A provided to prevent LOB.      Exercises   Other Exercises  Functional high level circuit training during session performing supine<>sit, sit<>stand and step ups with BUE support all x 1 min with 30 sec rest in between.  Performed 3 reps of this during session keeping HR within 65-85% HR max (101-132).  Pt tolerated very well.                     PT Education - 11/07/21 1330     Education Details Purpose of high intensity gait and circuit training during session    Person(s) Educated Patient    Methods Explanation    Comprehension Verbalized understanding              PT Short Term Goals - 10/25/21 1559       PT SHORT TERM GOAL #1   Title Pt will demonstrate independence with initial HEP    Baseline Pt reports compliance with HEP    Status Achieved      PT SHORT TERM GOAL #2   Title Pt will demonstrate improved functional LE strength as indicated by ability to perform 8-9 reps of sit <> stand WITHOUT use of UE in 30 seconds    Baseline 10/25/2021 8 in 30 sec with no UE assist    Status Achieved      PT SHORT TERM GOAL #3   Title Pt will participate in further assessment of balance impairments with Mini Best test    Baseline Completed on 10/01/2021, pt scored 9 on Mini Best Test    Status Achieved       PT SHORT TERM GOAL #4   Title Pt will improve gait velocity with RW to >/= 2.62 ft/sec    Baseline 10/25/2021 1.9 ft/sec, improved just not to goal level    Status Partially Met      PT SHORT TERM GOAL #5  Title Pt will ambulate without AD x 115' over level indoor surfaces with supervision and no LOB    Baseline 10/25/2021 Pt ambulated 115' with straight tip cane with quad tip and min guard, have not been working on gait with no AD at this time in therapy    Status Partially Met               PT Long Term Goals - 09/25/21 1357       PT LONG TERM GOAL #1   Title Pt will demonstrate independence with final HEP    Time 8    Period Weeks    Status New    Target Date 11/24/21      PT LONG TERM GOAL #2   Title Pt will increase gait velocity without AD to >/= 2.62 ft/sec without LOB    Time 8    Period Weeks    Status New    Target Date 11/24/21      PT LONG TERM GOAL #3   Title Pt will increase score on Mini-Best test by 5 points to indicate decreased falls risk    Time 8    Period Weeks    Status New    Target Date 11/24/21      PT LONG TERM GOAL #4   Title Pt will ambulate x 250' outside over uneven pavement, negotiate ramp, curb and 12 stairs with LRAD MOD I.    Time 8    Period Weeks    Status New    Target Date 11/24/21                   Plan - 11/07/21 1331     Clinical Impression Statement Session focused on high level functional circuit training for improved cardiovascular endurance as well as improving neuroplasticity with these tasks along with high level gait and balance training to carryover to improved gait speed.  Pt tolreated very well.    Personal Factors and Comorbidities Comorbidity 3+;Past/Current Experience;Social Background;Transportation    Comorbidities OA, h/o blood transfusion, h/o radiation therapy, macular degeneration, CVA, craniotomy > cranioplasty and resection of meningioma, R hemiparesis, focal seizures, urinary retention     Examination-Activity Limitations Locomotion Level;Stairs;Stand    Examination-Participation Restrictions Community Activity;Driving;Yard Work    [employer] officer Evolving/Moderate complexity    Rehab Potential Good    PT Frequency 1x / week    PT Duration 8 weeks    PT Treatment/Interventions ADLs/Self Care Home Management;Aquatic Therapy;DME Instruction;Gait training;Stair training;Functional mobility training;Therapeutic activities;Therapeutic exercise;Balance training;Neuromuscular re-education;Patient/family education;Passive range of motion    PT Next Visit Plan continue high intensity circuit and gait training (HR to stay within 102-132-can do any functional task, if becoming easy, weight him, add speed, etc) No estim due to CA and seizures. gait with cane with quad tip (indoors and outdoors), obstacles, stairs, curb, ramp,  balance training and LE strengthening.    PT Home Exercise Plan Access Code: [email or web address]    Consulted and Agree with Plan of Care Patient             Patient will benefit from skilled therapeutic intervention in order to improve the following deficits and impairments:  Abnormal gait, Decreased activity tolerance, Decreased balance, Decreased strength, Difficulty walking  Visit Diagnosis: Unsteadiness on feet  Muscle weakness (generalized)  Foot drop, right  Other abnormalities of gait and mobility     Problem List Patient Active Problem List   Diagnosis Date Noted   Spasticity 09/11/2021  Urinary retention 07/17/2021   S/P resection of meningioma 06/26/2021   S/P craniotomy 06/21/2021   Focal seizures ([place]) 05/06/2021   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   Meningioma ([place]) 10/29/2018   Other abnormalities of gait and mobility 09/17/2018   Acute cerebral infarction (HCC)    Thrombocytopenia (HCC)    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 ([place])    Acute blood loss anemia     Generalized OA    Gastroesophageal reflux disease    Seizure prophylaxis    Leukocytosis    Tumor 08/18/2018   Meningioma determined by biopsy of brain ([place]) 08/18/2018   MACULAR DEGENERATION 02/29/2008   ALLERGY 02/29/2008   Nonspecific (abnormal) findings on radiological and other examination of body structure 12/26/2007   NONSPECIFIC ABNORM FIND RAD&OTH EXAM LUNG FIELD 12/26/2007   GALLSTONES 10/01/2007   ELEVATION, TRANSAMINASE/LDH LEVELS 09/06/2007   HEPATITIS NOS 09/01/2007   ABDOMINAL PAIN, EPIGASTRIC 09/01/2007    Cameron Sprang, PT, MPT [facility] [address] [place], [place], [address] Phone: [phone]   Fax:  [phone] 11/07/21, 1:33 PM   Name: James Olsen MRN: [id number] Date of Birth: 01-Mar-1946

## 2021-11-09 ENCOUNTER — Other Ambulatory Visit: Payer: Self-pay

## 2021-11-09 ENCOUNTER — Ambulatory Visit
Admission: RE | Admit: 2021-11-09 | Discharge: 2021-11-09 | Disposition: A | Discharge status: Home / Self Care | Payer: Medicare Other | Source: Ambulatory Visit | Attending: Neurosurgery | Admitting: Neurosurgery

## 2021-11-09 DIAGNOSIS — D329 Benign neoplasm of meninges, unspecified: Secondary | ICD-10-CM

## 2021-11-09 MED ORDER — GADOBENATE DIMEGLUMINE 529 MG/ML IV SOLN
15.0000 mL | Freq: Once | INTRAVENOUS | Status: AC | PRN
  Administered 2021-11-09: 15 mL via INTRAVENOUS

## 2021-11-11 ENCOUNTER — Ambulatory Visit: Payer: Medicare Other | Admitting: Physical Therapy

## 2021-11-11 ENCOUNTER — Other Ambulatory Visit: Payer: Self-pay

## 2021-11-11 ENCOUNTER — Inpatient Hospital Stay: Payer: Medicare Other | Attending: Internal Medicine

## 2021-11-11 DIAGNOSIS — R2689 Other abnormalities of gait and mobility: Secondary | ICD-10-CM

## 2021-11-11 DIAGNOSIS — M6281 Muscle weakness (generalized): Secondary | ICD-10-CM

## 2021-11-11 DIAGNOSIS — R2681 Unsteadiness on feet: Secondary | ICD-10-CM

## 2021-11-11 DIAGNOSIS — M21371 Foot drop, right foot: Secondary | ICD-10-CM

## 2021-11-11 NOTE — Therapy (Signed)
[facility] [address] [place], [place], [address] Phone: [phone]   Fax:  [phone]  Physical Therapy Treatment  Patient Details  Name: James Olsen MRN: [id number] Date of Birth: [birth year] Referring Provider (PT): Meredith Staggers, MD   Encounter Date: 11/11/2021   PT End of Session - 11/11/21 1307     Visit Number 13    Number of Visits 17    Date for PT Re-Evaluation 11/24/21    Authorization Type Medicare and [place] - 10th visit PN and KX modifier required    Progress Note Due on Visit 20    PT Start Time 1105    PT Stop Time 1147    PT Time Calculation (min) 42 min    Activity Tolerance Patient tolerated treatment well    Behavior During Therapy WFL for tasks assessed/performed             Past Medical History:  Diagnosis Date   Arthritis    GERD (gastroesophageal reflux disease)    Glaucoma    pt denies.    History of blood transfusion     5 units after previous surgery   History of radiation therapy 12/09/18- 01/24/2019   Left sagittal brain, 11.8 Gy in 31 fractions for a total dose of 55.8 Gy   Macular degeneration    Stroke [facility])    after surgery in August 2019 mostly recovered still some weakness in left arm   Wears glasses     Past Surgical History:  Procedure Laterality Date   APPLICATION OF CRANIAL NAVIGATION N/A 06/21/2021   Procedure: APPLICATION OF CRANIAL NAVIGATION;  Surgeon: [name], MD;  Location: [facility];  Service: Neurosurgery;  Laterality: N/A;   BRAIN SURGERY     CHOLECYSTECTOMY  2008   lap choli   COLONOSCOPY     CRANIOPLASTY N/A 10/29/2018   Procedure: CRANIOPLASTY;  Surgeon: [name], MD;  Location: [facility];  Service: Neurosurgery;  Laterality: N/A;  CRANIOPLASTY   CRANIOTOMY N/A 08/18/2018   Procedure: CRANIOTOMY TUMOR EXCISION;  Surgeon: [name], MD;  Location: [facility];  Service: Neurosurgery;  Laterality: N/A;  CRANIOTOMY TUMOR EXCISION    CRANIOTOMY Bilateral 06/21/2021   Procedure: Bilateral Craniotomy for tumor resection;  Surgeon: [name], MD;  Location: [facility];  Service: Neurosurgery;  Laterality: Bilateral;   DIAGNOSTIC LAPAROSCOPY     EYE SURGERY     laser surgery to relieve pressure   NAILBED REPAIR Left 05/04/2014   Procedure: LEFT INDEX NAIL ABLATION V-Y FLAP COVERAGE;  Surgeon: [name]., MD;  Location: [facility];  Service: Orthopedics;  Laterality: Left;  index   PILONIDAL CYST EXCISION     x2   TONSILLECTOMY     VASECTOMY      There were no vitals filed for this visit.   Subjective Assessment - 11/11/21 1108     Subjective Last session was fun!  No soreness or issues after last session.  Has appointment with oncologist tomorrow and will learn results of most recent MRI.    Pertinent History OA, h/o blood transfusion, h/o radiation therapy, macular degeneration, CVA, craniotomy > cranioplasty and resection of meningioma, R hemiparesis, focal seizures, urinary retention    Patient Stated Goals Walking, gain more strength in RLE to be able to drive in 6 months if seizure free               [facility] Adult PT Treatment/Exercise - 11/11/21 1110  Knee/Hip Exercises: Aerobic   Tread Mill Resting HR: 70 BPM; Performed treadmill x 0.8 mph with bilat UE support x 5 minutes with therapist providing mod A at pelvis for lateral weight shifting to L and R and providing verbal cues for increased step length, heel strike bilaterally.  Increased assistance required to shift weight to LLE to allow for improved RLE clearance and step length.      Knee/Hip Exercises: Standing   Forward Step Up Right;Left;2 sets;Hand Hold: 2;Step Height: 6";Limitations    Forward Step Up Limitations leading with L up and down x 1 minute, leading with R up and down x 1 minute    Other Standing Knee Exercises Standing taps to first step, 1 minute taps with RLE and then 1 minute taps with LLE with bilat UE  support.      Knee/Hip Exercises: Seated   Sit to Sand 2 sets;with UE support;Other (comment)   RUE pushing up from arm rest, LLE extended, 30 seconds partial sit >stand on RLE             PT Education - 11/11/21 1307     Education Details Plan to begin to assess LTG next week and depending on progress, will likely recert for more visits.  Will schedule more visits tomorrow when pt has his calendar.    Person(s) Educated Patient    Methods Explanation    Comprehension Verbalized understanding              PT Short Term Goals - 10/25/21 1559       PT SHORT TERM GOAL #1   Title Pt will demonstrate independence with initial HEP    Baseline Pt reports compliance with HEP    Status Achieved      PT SHORT TERM GOAL #2   Title Pt will demonstrate improved functional LE strength as indicated by ability to perform 8-9 reps of sit <> stand WITHOUT use of UE in 30 seconds    Baseline 10/25/2021 8 in 30 sec with no UE assist    Status Achieved      PT SHORT TERM GOAL #3   Title Pt will participate in further assessment of balance impairments with Mini Best test    Baseline Completed on 10/01/2021, pt scored 9 on Mini Best Test    Status Achieved      PT SHORT TERM GOAL #4   Title Pt will improve gait velocity with RW to >/= 2.62 ft/sec    Baseline 10/25/2021 1.9 ft/sec, improved just not to goal level    Status Partially Met      PT SHORT TERM GOAL #5   Title Pt will ambulate without AD x 115' over level indoor surfaces with supervision and no LOB    Baseline 10/25/2021 Pt ambulated 115' with straight tip cane with quad tip and min guard, have not been working on gait with no AD at this time in therapy    Status Partially Met               PT Long Term Goals - 09/25/21 1357       PT LONG TERM GOAL #1   Title Pt will demonstrate independence with final HEP    Time 8    Period Weeks    Status New    Target Date 11/24/21      PT LONG TERM GOAL #2   Title Pt will  increase gait velocity without AD to >/= 2.62 ft/sec without  LOB    Time 8    Period Weeks    Status New    Target Date 11/24/21      PT LONG TERM GOAL #3   Title Pt will increase score on Mini-Best test by 5 points to indicate decreased falls risk    Time 8    Period Weeks    Status New    Target Date 11/24/21      PT LONG TERM GOAL #4   Title Pt will ambulate x 250' outside over uneven pavement, negotiate ramp, curb and 12 stairs with LRAD MOD I.    Time 8    Period Weeks    Status New    Target Date 11/24/21                   Plan - 11/11/21 1309     Clinical Impression Statement Continued to incorporate functional endurance training in to therapy session today in combination with treadmill for gait training, stairs and sit <> stand training to also focus on weight shifting, SLS, and swing phase training.  Pt tolerated well and demonstrated improved stance time, step length and R foot clearance on treadmill when increased weight shift was facilitated by PT.  Will continue to address and progress towards LTG.    Personal Factors and Comorbidities Comorbidity 3+;Past/Current Experience;Social Background;Transportation    Comorbidities OA, h/o blood transfusion, h/o radiation therapy, macular degeneration, CVA, craniotomy > cranioplasty and resection of meningioma, R hemiparesis, focal seizures, urinary retention    Examination-Activity Limitations Locomotion Level;Stairs;Stand    Examination-Participation Restrictions Community Activity;Driving;Yard Work    [employer] officer Evolving/Moderate complexity    Rehab Potential Good    PT Frequency 1x / week    PT Duration 8 weeks    PT Treatment/Interventions ADLs/Self Care Home Management;Aquatic Therapy;DME Instruction;Gait training;Stair training;Functional mobility training;Therapeutic activities;Therapeutic exercise;Balance training;Neuromuscular re-education;Patient/family education;Passive range of motion     PT Next Visit Plan How was his appointment with Oncologist?  Please schedule for more visits: 2x/week x 8 with /Kathy/Emily Parcell.  continue high intensity circuit and gait training (HR to stay within 102-132-can do any functional task, if becoming easy, weight him, add speed, etc) No estim due to CA and seizures. gait with cane with quad tip (indoors and outdoors), obstacles, stairs, curb, ramp,  balance training and LE strengthening.    PT Home Exercise Plan Access Code: [email or web address]    Consulted and Agree with Plan of Care Patient             Patient will benefit from skilled therapeutic intervention in order to improve the following deficits and impairments:  Abnormal gait, Decreased activity tolerance, Decreased balance, Decreased strength, Difficulty walking  Visit Diagnosis: Unsteadiness on feet  Muscle weakness (generalized)  Foot drop, right  Other abnormalities of gait and mobility     Problem List Patient Active Problem List   Diagnosis Date Noted   Spasticity 09/11/2021   Urinary retention 07/17/2021   S/P resection of meningioma 06/26/2021   S/P craniotomy 06/21/2021   Focal seizures ([facility]) 05/06/2021   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   Meningioma ([facility]) 10/29/2018   Other abnormalities of gait and mobility 09/17/2018   Acute cerebral infarction (HCC)    Thrombocytopenia (HCC)    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 ([facility])    Acute blood loss anemia    Generalized OA    Gastroesophageal reflux disease    Seizure prophylaxis  Leukocytosis    Tumor 08/18/2018   Meningioma determined by biopsy of brain ([facility]) 08/18/2018   MACULAR DEGENERATION 02/29/2008   ALLERGY 02/29/2008   Nonspecific (abnormal) findings on radiological and other examination of body structure 12/26/2007   NONSPECIFIC ABNORM FIND RAD&OTH EXAM LUNG FIELD 12/26/2007   GALLSTONES 10/01/2007   ELEVATION, TRANSAMINASE/LDH LEVELS 09/06/2007    HEPATITIS NOS 09/01/2007   ABDOMINAL PAIN, EPIGASTRIC 09/01/2007    Rico Junker, PT, DPT 11/11/21    1:13 PM    [facility] [address] [place], [place], [address] Phone: [phone]   Fax:  [phone]  Name: MILT COYE MRN: [id number] Date of Birth: 04-13-1946

## 2021-11-12 ENCOUNTER — Inpatient Hospital Stay (HOSPITAL_BASED_OUTPATIENT_CLINIC_OR_DEPARTMENT_OTHER): Payer: Medicare Other | Admitting: Internal Medicine

## 2021-11-12 ENCOUNTER — Encounter: Payer: Self-pay | Admitting: Physical Therapy

## 2021-11-12 ENCOUNTER — Ambulatory Visit: Payer: Medicare Other | Admitting: Physical Therapy

## 2021-11-12 ENCOUNTER — Telehealth: Payer: Self-pay | Admitting: *Deleted

## 2021-11-12 DIAGNOSIS — R2681 Unsteadiness on feet: Secondary | ICD-10-CM

## 2021-11-12 DIAGNOSIS — R569 Unspecified convulsions: Secondary | ICD-10-CM | POA: Diagnosis not present

## 2021-11-12 DIAGNOSIS — D420 Neoplasm of uncertain behavior of cerebral meninges: Secondary | ICD-10-CM | POA: Diagnosis not present

## 2021-11-12 DIAGNOSIS — M6281 Muscle weakness (generalized): Secondary | ICD-10-CM | POA: Diagnosis not present

## 2021-11-12 DIAGNOSIS — R2689 Other abnormalities of gait and mobility: Secondary | ICD-10-CM

## 2021-11-12 DIAGNOSIS — M21371 Foot drop, right foot: Secondary | ICD-10-CM

## 2021-11-12 NOTE — Therapy (Signed)
[facility] [address] [place], [place], [address] Phone: [phone]   Fax:  [phone]  Physical Therapy Treatment  Patient Details  Name: James Olsen MRN: [id number] Date of Birth: [birth year] Referring Provider (PT): Meredith Staggers, MD   Encounter Date: 11/12/2021   PT End of Session - 11/12/21 1426     Visit Number 14    Number of Visits 17    Date for PT Re-Evaluation 11/24/21    Authorization Type Medicare and [place] - 10th visit PN and KX modifier required    Progress Note Due on Visit 20    PT Start Time 1232    PT Stop Time 1317    PT Time Calculation (min) 45 min    Equipment Utilized During Treatment Gait belt;Other (comment)   R AFO   Activity Tolerance Patient tolerated treatment well    Behavior During Therapy WFL for tasks assessed/performed             Past Medical History:  Diagnosis Date   Arthritis    GERD (gastroesophageal reflux disease)    Glaucoma    pt denies.    History of blood transfusion     5 units after previous surgery   History of radiation therapy 12/09/18- 01/24/2019   Left sagittal brain, 11.8 Gy in 31 fractions for a total dose of 55.8 Gy   Macular degeneration    Stroke [facility])    after surgery in August 2019 mostly recovered still some weakness in left arm   Wears glasses     Past Surgical History:  Procedure Laterality Date   APPLICATION OF CRANIAL NAVIGATION N/A 06/21/2021   Procedure: APPLICATION OF CRANIAL NAVIGATION;  Surgeon: [name], MD;  Location: [facility];  Service: Neurosurgery;  Laterality: N/A;   BRAIN SURGERY     CHOLECYSTECTOMY  2008   lap choli   COLONOSCOPY     CRANIOPLASTY N/A 10/29/2018   Procedure: CRANIOPLASTY;  Surgeon: [name], MD;  Location: [facility];  Service: Neurosurgery;  Laterality: N/A;  CRANIOPLASTY   CRANIOTOMY N/A 08/18/2018   Procedure: CRANIOTOMY TUMOR EXCISION;  Surgeon: [name], MD;  Location: [facility];   Service: Neurosurgery;  Laterality: N/A;  CRANIOTOMY TUMOR EXCISION   CRANIOTOMY Bilateral 06/21/2021   Procedure: Bilateral Craniotomy for tumor resection;  Surgeon: [name], MD;  Location: [facility];  Service: Neurosurgery;  Laterality: Bilateral;   DIAGNOSTIC LAPAROSCOPY     EYE SURGERY     laser surgery to relieve pressure   NAILBED REPAIR Left 05/04/2014   Procedure: LEFT INDEX NAIL ABLATION V-Y FLAP COVERAGE;  Surgeon: [name]., MD;  Location: [facility];  Service: Orthopedics;  Laterality: Left;  index   PILONIDAL CYST EXCISION     x2   TONSILLECTOMY     VASECTOMY      There were no vitals filed for this visit.   Subjective Assessment - 11/12/21 1237     Subjective The pt reports "the MRI showed some new small tumors." The pt reports an appointment next week with Peyton Bottoms with radiation oncology for high intensity radiation consult. No other falls, complaints, or pain today.    Pertinent History OA, h/o blood transfusion, h/o radiation therapy, macular degeneration, CVA, craniotomy > cranioplasty and resection of meningioma, R hemiparesis, focal seizures, urinary retention    Limitations Standing;Walking;House hold activities    Patient Stated Goals Walking, gain more strength in RLE to be able to  drive in 6 months if seizure free    Currently in Pain? No/denies               [facility] Adult PT Treatment/Exercise - 11/12/21 1241       Ambulation/Gait   Ambulation/Gait Yes    Ambulation/Gait Assistance 4: Min guard    Ambulation Distance (Feet) 300 Feet   2x around frack with straight cane. Throughout session with RW.   Assistive device Straight cane;Rolling walker    Gait Pattern Step-through pattern;Decreased arm swing - right;Decreased stride length;Decreased hip/knee flexion - right;Decreased dorsiflexion - right;Poor foot clearance - right    Ambulation Surface Level;Indoor      Exercises   Other Exercises  Functional high level circuit  training during session performing sit<>stands, step ups with BUE support, and up/down ramp with RW, all x 1 min with 30 sec rest in between.  Performed 3 reps of this during session keeping HR between 85-95 BPM.  Pt tolerated very well. Min guard assist with all tasks for safety. Sit to stands starting with no to single UE support, downgrading to bil UE support as pt fatigued.               PT Short Term Goals - 10/25/21 1559       PT SHORT TERM GOAL #1   Title Pt will demonstrate independence with initial HEP    Baseline Pt reports compliance with HEP    Status Achieved      PT SHORT TERM GOAL #2   Title Pt will demonstrate improved functional LE strength as indicated by ability to perform 8-9 reps of sit <> stand WITHOUT use of UE in 30 seconds    Baseline 10/25/2021 8 in 30 sec with no UE assist    Status Achieved      PT SHORT TERM GOAL #3   Title Pt will participate in further assessment of balance impairments with Mini Best test    Baseline Completed on 10/01/2021, pt scored 9 on Mini Best Test    Status Achieved      PT SHORT TERM GOAL #4   Title Pt will improve gait velocity with RW to >/= 2.62 ft/sec    Baseline 10/25/2021 1.9 ft/sec, improved just not to goal level    Status Partially Met      PT SHORT TERM GOAL #5   Title Pt will ambulate without AD x 115' over level indoor surfaces with supervision and no LOB    Baseline 10/25/2021 Pt ambulated 115' with straight tip cane with quad tip and min guard, have not been working on gait with no AD at this time in therapy    Status Partially Met               PT Long Term Goals - 09/25/21 1357       PT LONG TERM GOAL #1   Title Pt will demonstrate independence with final HEP    Time 8    Period Weeks    Status New    Target Date 11/24/21      PT LONG TERM GOAL #2   Title Pt will increase gait velocity without AD to >/= 2.62 ft/sec without LOB    Time 8    Period Weeks    Status New    Target Date  11/24/21      PT LONG TERM GOAL #3   Title Pt will increase score on Mini-Best test by 5 points to indicate  decreased falls risk    Time 8    Period Weeks    Status New    Target Date 11/24/21      PT LONG TERM GOAL #4   Title Pt will ambulate x 250' outside over uneven pavement, negotiate ramp, curb and 12 stairs with LRAD MOD I.    Time 8    Period Weeks    Status New    Target Date 11/24/21              Plan - 11/12/21 1428     Clinical Impression Statement Today's skilled session was focused on continued functional endurance circuit training. Pt tolerated treatment well, with adequate rest breaks as needed. The pt could continue to benefit from further skilled PT to address functional deficits.    Personal Factors and Comorbidities Comorbidity 3+;Past/Current Experience;Social Background;Transportation    Comorbidities OA, h/o blood transfusion, h/o radiation therapy, macular degeneration, CVA, craniotomy > cranioplasty and resection of meningioma, R hemiparesis, focal seizures, urinary retention    Examination-Activity Limitations Locomotion Level;Stairs;Stand    Examination-Participation Restrictions Community Activity;Driving;Yard Work    [employer]chant [employer] officer Evolving/Moderate complexity    Rehab Potential Good    PT Frequency 2x / week    PT Duration 8 weeks    PT Treatment/Interventions ADLs/Self Care Home Management;Aquatic Therapy;DME Instruction;Gait training;Stair training;Functional mobility training;Therapeutic activities;Therapeutic exercise;Balance training;Neuromuscular re-education;Patient/family education;Passive range of motion    PT Next Visit Plan continue high intensity circuit and gait training (HR to stay within 102-132-can do any functional task, if becoming easy, weight him, add speed, etc) No estim due to CA and seizures. gait with cane with quad tip (indoors and outdoors), obstacles, stairs, curb, ramp,  balance training and LE  strengthening.    PT Home Exercise Plan Access Code: [email or web address]    Consulted and Agree with Plan of Care Patient             Patient will benefit from skilled therapeutic intervention in order to improve the following deficits and impairments:  Abnormal gait, Decreased activity tolerance, Decreased balance, Decreased strength, Difficulty walking  Visit Diagnosis: Unsteadiness on feet  Muscle weakness (generalized)  Foot drop, right  Other abnormalities of gait and mobility     Problem List Patient Active Problem List   Diagnosis Date Noted   Spasticity 09/11/2021   Urinary retention 07/17/2021   S/P resection of meningioma 06/26/2021   S/P craniotomy 06/21/2021   Focal seizures ([facility]) 05/06/2021   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   Meningioma ([place]) 10/29/2018   Other abnormalities of gait and mobility 09/17/2018   Acute cerebral infarction (HCC)    Thrombocytopenia ([facility])    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 ([facility])    Acute blood loss anemia    Generalized OA    Gastroesophageal reflux disease    Seizure prophylaxis    Leukocytosis    Tumor 08/18/2018   Meningioma determined by biopsy of brain ([facility]) 08/18/2018   MACULAR DEGENERATION 02/29/2008   ALLERGY 02/29/2008   Nonspecific (abnormal) findings on radiological and other examination of body structure 12/26/2007   NONSPECIFIC ABNORM FIND RAD&OTH EXAM LUNG FIELD 12/26/2007   GALLSTONES 10/01/2007   ELEVATION, TRANSAMINASE/LDH LEVELS 09/06/2007   HEPATITIS NOS 09/01/2007   ABDOMINAL PAIN, EPIGASTRIC 09/01/2007    Rondel Baton, SPTA 11/12/2021, 2:47 PM  [facility] [address] [place], [place], [address] Phone: [phone]   Fax:  [phone]  Name:  James Olsen MRN: [id number] Date of Birth: [birth year]  This note has been reviewed and edited by supervising CI.   Willow Ora, PTA,  [facility] [address], [place], [place] [address] [phone] 11/12/21, 4:37 PM

## 2021-11-12 NOTE — Telephone Encounter (Signed)
Patient had phone visit with provider this morning.  He failed to ask question.  Routing to MD to advise.    He states he has been on the Keppra 500 mg BID and last seizure was in May.  Is he at a point that he can discontinue that dose, if so how would it be an abrupt stop or taper.

## 2021-11-12 NOTE — Progress Notes (Signed)
I connected with James Olsen on 11/12/21 at  9:30 AM EST by telephone visit and verified that I am speaking with the correct person using two identifiers.  I discussed the limitations, risks, security and privacy concerns of performing an evaluation and management service by telemedicine and the availability of in-person appointments. I also discussed with the patient that there may be a patient responsible charge related to this service. The patient expressed understanding and agreed to proceed.  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  He is doing some walking with a cane or walker, continues to work hard with PT.  He does notices the bump on top of his head is more prominent in recent weeks.  No other new or progressive symptoms. Observations: Language and cognition at baseline Imaging:  [facility] Clinician Interpretation: I have personally reviewed the CNS images as listed.  My interpretation, in the context of the patient's clinical presentation, is progressive disease  MR BRAIN W WO CONTRAST  Result Date: 11/11/2021 CLINICAL DATA:  Meningioma post resection, follow-up EXAM: MRI HEAD WITHOUT AND WITH CONTRAST TECHNIQUE: Multiplanar, multiecho pulse sequences of the brain and surrounding structures were obtained without and with intravenous contrast. CONTRAST:  59mL MULTIHANCE GADOBENATE DIMEGLUMINE 529 MG/ML IV SOLN COMPARISON:  08/18/2021 FINDINGS: Brain: Postoperative changes are again identified at the vertex. There is regional dural thickening and enhancement. The enhancement appears more plaque-like anteriorly (for example series 14, image 20). There is greater nodularity posteriorly and enhancement in this region  has slightly increased (compare series 14, image 11 versus is series 18, image 10 on prior). Additionally, there is area of curvilinear enhancement at the deep margin of the resection cavity on the right at demonstrates thicker enhancement presently (series 14, image 17 versus series 18, image 17 previously). Extent of underlying T2 FLAIR hyperintensity is not significantly changed. Further increase in size of meningioma along the posterior left frontal convexity now measuring 1.8 x 1.2 cm (previously 1.1 x 0.7 cm). No underlying edema. No acute infarction or hemorrhage. Ventricles are stable in size. Stable findings of probable chronic microvascular ischemic changes in the cerebral white matter. Vascular: Major vessel flow voids at the skull base are preserved. Skull and upper cervical spine: Normal marrow signal is preserved. Sinuses/Orbits: Paranasal sinuses are aerated. Orbits are unremarkable. Other: Sella is unremarkable.  Mastoid air cells are clear. IMPRESSION: Persistent areas of dural thickening and enhancement. Enhancement more anteriorly appears postoperative. There is slightly increased enhancement posteriorly and at the contralateral deep margin of the resection that could reflect residual tumor. Electronically Signed   By: Macy Mis M.D.   On: 11/11/2021 09:14    Assessment and Plan: 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James Olsen)  Focal seizures (James Olsen)  There has been notable progression affecting previously untreated meningioma along left frontal convexity.  Growth is appreciated especially when compared to MRI from May 2022.  Changes surrounding primary site of disease are of unclear etiology, may continue to be post-surgical in nature.    Would be reasonable to move forward with radiosurgery to progressive component; patient will meet with Dr. Christella Noa next week, and we will arrange follow up with radiation oncology as well.    Tumor board team will continue to review  this case and consider  treatment plans.  Follow Up Instructions: RTC is pending radiation planning.  I discussed the assessment and treatment plan with the patient.  The patient was provided an opportunity to ask questions and all were answered.  The patient agreed with the plan and demonstrated understanding of the instructions.    The patient was advised to call back or seek an in-person evaluation if the symptoms worsen or if the condition fails to improve as anticipated.  I provided 5-10 minutes of non-face-to-face time during this enocunter.  [name], MD   I provided 25 minutes of non face-to-face telephone visit time during this encounter, and > 50% was spent counseling as documented under my assessment & plan.

## 2021-11-19 ENCOUNTER — Other Ambulatory Visit: Payer: Self-pay | Admitting: Neurosurgery

## 2021-11-19 ENCOUNTER — Other Ambulatory Visit: Payer: Self-pay

## 2021-11-19 ENCOUNTER — Ambulatory Visit: Payer: Medicare Other | Admitting: Rehabilitation

## 2021-11-19 ENCOUNTER — Encounter: Payer: Self-pay | Admitting: Rehabilitation

## 2021-11-19 ENCOUNTER — Institutional Professional Consult (permissible substitution): Payer: Self-pay | Admitting: Radiation Oncology

## 2021-11-19 DIAGNOSIS — R2681 Unsteadiness on feet: Secondary | ICD-10-CM

## 2021-11-19 DIAGNOSIS — D329 Benign neoplasm of meninges, unspecified: Secondary | ICD-10-CM

## 2021-11-19 DIAGNOSIS — M21371 Foot drop, right foot: Secondary | ICD-10-CM

## 2021-11-19 DIAGNOSIS — R2689 Other abnormalities of gait and mobility: Secondary | ICD-10-CM

## 2021-11-19 DIAGNOSIS — M6281 Muscle weakness (generalized): Secondary | ICD-10-CM

## 2021-11-19 NOTE — Progress Notes (Signed)
Location/Histology of Brain Tumor:  Atypical meningioma of brain   MRI Brain w/ & w/o Contrast 11/09/2021 --IMPRESSION: Persistent areas of dural thickening and enhancement. Enhancement more anteriorly appears postoperative. There is slightly increased enhancement posteriorly and at the contralateral deep margin of the resection that could reflect residual tumor.  Past or anticipated interventions, if any, per neurosurgery:  06/21/2021 --Dr. [name] Bilateral Craniotomy for tumor resection APPLICATION OF CRANIAL NAVIGATION  10/29/2018 --Dr. [name] CRANIOPLASTY  08/18/2018 --Dr. [name] CRANIOTOMY TUMOR EXCISION  Past or anticipated interventions, if any, per medical oncology:  Under care of Dr. Cecil Cobbs 11/12/2021 There has been notable progression affecting previously untreated meningioma along left frontal convexity.  Growth is appreciated especially when compared to MRI from May 2022. Changes surrounding primary site of disease are of unclear etiology, may continue to be post-surgical in nature.   Would be reasonable to move forward with radiosurgery to progressive component; patient will meet with Dr. Christella Noa next week, and we will arrange follow up with radiation oncology as well.   Tumor board team will continue to review this case and consider treatment plans. Follow Up Instructions: RTC is pending radiation planning.  Dose of Decadron, if applicable: Not currently prescribed   Recent neurologic symptoms, if any:  Seizures: Patient denies any new occurances  Headaches: Patient denies Nausea: Patient denies Dizziness/ataxia: Patient denies Difficulty with hand coordination: Patient denies any changes/worsening to right arm Focal numbness/weakness: Reports numbness to both right leg and arm; right leg sensation is constant, right arm only occurs when he raises his arm to shoulder height. Continues to work with PT to improve strength, gait, and  speed Visual deficits/changes: Patient denies Confusion/Memory deficits: Patient denies; does occasionally struggle to find a word, but otherwise denies any concerns  SAFETY ISSUES: Prior radiation? Yes: 12/09/2018-01/24/2019  Left sagittal Brain, 1.8 Gy x 31 fractions for a total dose of 55.8 Gy  Pacemaker/ICD? No Possible current pregnancy? N/A Is the patient on methotrexate? No  Additional Complaints / other details: Nothing else of note

## 2021-11-19 NOTE — Therapy (Signed)
[facility] [address] [place], [place], [address] Phone: [phone]   Fax:  [phone]  Physical Therapy Treatment  Patient Details  Name: James Olsen MRN: [id number] Date of Birth: 1946/12/07 Referring Provider (PT): Meredith Staggers, MD   Encounter Date: 11/19/2021   PT End of Session - 11/19/21 1512     Visit Number 15    Number of Visits 17    Date for PT Re-Evaluation 11/24/21    Authorization Type Medicare and [place] - 10th visit PN and KX modifier required    Progress Note Due on Visit 20    PT Start Time 1404    PT Stop Time 1447    PT Time Calculation (min) 43 min    Equipment Utilized During Treatment Gait belt;Other (comment)   R AFO   Activity Tolerance Patient tolerated treatment well    Behavior During Therapy WFL for tasks assessed/performed             Past Medical History:  Diagnosis Date   Arthritis    GERD (gastroesophageal reflux disease)    Glaucoma    pt denies.    History of blood transfusion     5 units after previous surgery   History of radiation therapy 12/09/18- 01/24/2019   Left sagittal brain, 11.8 Gy in 31 fractions for a total dose of 55.8 Gy   Macular degeneration    Stroke [facility])    after surgery in August 2019 mostly recovered still some weakness in left arm   Wears glasses     Past Surgical History:  Procedure Laterality Date   APPLICATION OF CRANIAL NAVIGATION N/A 06/21/2021   Procedure: APPLICATION OF CRANIAL NAVIGATION;  Surgeon: [name], MD;  Location: [facility];  Service: Neurosurgery;  Laterality: N/A;   BRAIN SURGERY     CHOLECYSTECTOMY  2008   lap choli   COLONOSCOPY     CRANIOPLASTY N/A 10/29/2018   Procedure: CRANIOPLASTY;  Surgeon: [name], MD;  Location: [place];  Service: Neurosurgery;  Laterality: N/A;  CRANIOPLASTY   CRANIOTOMY N/A 08/18/2018   Procedure: CRANIOTOMY TUMOR EXCISION;  Surgeon: [name], MD;  Location: [place];   Service: Neurosurgery;  Laterality: N/A;  CRANIOTOMY TUMOR EXCISION   CRANIOTOMY Bilateral 06/21/2021   Procedure: Bilateral Craniotomy for tumor resection;  Surgeon: [name], MD;  Location: [place];  Service: Neurosurgery;  Laterality: Bilateral;   DIAGNOSTIC LAPAROSCOPY     EYE SURGERY     laser surgery to relieve pressure   NAILBED REPAIR Left 05/04/2014   Procedure: LEFT INDEX NAIL ABLATION V-Y FLAP COVERAGE;  Surgeon: [name]., MD;  Location: [facility];  Service: Orthopedics;  Laterality: Left;  index   PILONIDAL CYST EXCISION     x2   TONSILLECTOMY     VASECTOMY      There were no vitals filed for this visit.   Subjective Assessment - 11/19/21 1409     Subjective Pt providing run down on new MRI results and seeing radiation oncologist tomorrow. He is thinking they are going to do high intensity radiation to both areas of brain vs chemo.    Pertinent History OA, h/o blood transfusion, h/o radiation therapy, macular degeneration, CVA, craniotomy > cranioplasty and resection of meningioma, R hemiparesis, focal seizures, urinary retention    Patient Stated Goals Walking, gain more strength in RLE to be able to drive in 6 months if seizure free    Currently  in Pain? No/denies                               [facility] Adult PT Treatment/Exercise - 11/19/21 1430       Transfers   Transfers Sit to Stand;Stand to Sit    Sit to Stand 5: Supervision;4: Min guard    Sit to Stand Details Tactile cues for placement;Verbal cues for sequencing;Verbal cues for technique    Stand to Sit 5: Supervision      Ambulation/Gait   Ambulation/Gait Yes    Ambulation/Gait Assistance 4: Min assist    Ambulation/Gait Assistance Details Continue to work on high intensity gait training addressing both swing and then swing and propulsion. 10lbs on RLE during all gait with PT providing HHA x 230' and then also from task to task during circuit training.  Following 2  laps with weight on RLE, added resistance band around waist for resisted gait x 230' with R HHA from PT.  Cues for breathing throughout and no overt LOB.  HR up to 117 following these tasks.  Continue to note improvement in gait speed following removal of weight and resistance with gait speed of 3.06 ft/sec without device and min/guard (note less assist than previous sessions).    Ambulation Distance (Feet) --   see above   Assistive device 1 person hand held assist    Gait Pattern Step-through pattern;Decreased arm swing - right;Decreased stride length;Decreased hip/knee flexion - right;Decreased dorsiflexion - right;Poor foot clearance - right    Ambulation Surface Level;Indoor    Gait velocity 3.06 ft/sec      Exercises   Other Exercises  Functional high level circuit training during session performing lateral step overs (small black balance beam as we tried orange barriers stepping forward and this was too difficult), sit<>stand (with 5lbs dumbells lifting overhead once in standing)  and step ups with BUE support (and 5lbs on each LE)  all x 1 min with 30 sec rest in between.  Performed 2 reps of this during session keeping HR within 65-85% HR max (101-132).  Pt tolerated very well.                     PT Education - 11/19/21 1511     Education Details Continue to focus on high level circuit training and high intensity gait training for improved cardiovascular endurance and to make gait and balance more automatic as it was premorbid.  Pt tolerates very well during session and continues to demonstrate marked improvement in gait speed.    Person(s) Educated Patient    Methods Explanation    Comprehension Verbalized understanding              PT Short Term Goals - 10/25/21 1559       PT SHORT TERM GOAL #1   Title Pt will demonstrate independence with initial HEP    Baseline Pt reports compliance with HEP    Status Achieved      PT SHORT TERM GOAL #2   Title Pt will  demonstrate improved functional LE strength as indicated by ability to perform 8-9 reps of sit <> stand WITHOUT use of UE in 30 seconds    Baseline 10/25/2021 8 in 30 sec with no UE assist    Status Achieved      PT SHORT TERM GOAL #3   Title Pt will participate in further assessment of balance impairments with Mini  Best test    Baseline Completed on 10/01/2021, pt scored 9 on Mini Best Test    Status Achieved      PT SHORT TERM GOAL #4   Title Pt will improve gait velocity with RW to >/= 2.62 ft/sec    Baseline 10/25/2021 1.9 ft/sec, improved just not to goal level    Status Partially Met      PT SHORT TERM GOAL #5   Title Pt will ambulate without AD x 115' over level indoor surfaces with supervision and no LOB    Baseline 10/25/2021 Pt ambulated 115' with straight tip cane with quad tip and min guard, have not been working on gait with no AD at this time in therapy    Status Partially Met               PT Long Term Goals - 09/25/21 1357       PT LONG TERM GOAL #1   Title Pt will demonstrate independence with final HEP    Time 8    Period Weeks    Status New    Target Date 11/24/21      PT LONG TERM GOAL #2   Title Pt will increase gait velocity without AD to >/= 2.62 ft/sec without LOB    Time 8    Period Weeks    Status New    Target Date 11/24/21      PT LONG TERM GOAL #3   Title Pt will increase score on Mini-Best test by 5 points to indicate decreased falls risk    Time 8    Period Weeks    Status New    Target Date 11/24/21      PT LONG TERM GOAL #4   Title Pt will ambulate x 250' outside over uneven pavement, negotiate ramp, curb and 12 stairs with LRAD MOD I.    Time 8    Period Weeks    Status New    Target Date 11/24/21                   Plan - 11/19/21 1513     Clinical Impression Statement Skilled session focused on high level circuit training with functional tasks and high intensity gait training with continued carryover to improved  gait speed at 3.06 ft/sec today with less support from PT needed.  Will plan to check goals at next session and recert.    Personal Factors and Comorbidities Comorbidity 3+;Past/Current Experience;Social Background;Transportation    Comorbidities OA, h/o blood transfusion, h/o radiation therapy, macular degeneration, CVA, craniotomy > cranioplasty and resection of meningioma, R hemiparesis, focal seizures, urinary retention    Examination-Activity Limitations Locomotion Level;Stairs;Stand    Examination-Participation Restrictions Community Activity;Driving;Yard Work    [employer] Evolving/Moderate complexity    Rehab Potential Good    PT Frequency 2x / week    PT Duration 8 weeks    PT Treatment/Interventions ADLs/Self Care Home Management;Aquatic Therapy;DME Instruction;Gait training;Stair training;Functional mobility training;Therapeutic activities;Therapeutic exercise;Balance training;Neuromuscular re-education;Patient/family education;Passive range of motion    PT Next Visit Plan Check goals and recert, continue high intensity circuit and gait training (HR to stay within 102-132-can do any functional task, if becoming easy, weight him, add speed, etc) No estim due to CA and seizures. gait with cane with quad tip (indoors and outdoors), gait without device in therapy, obstacles, stairs, curb, ramp,  balance training and LE strengthening.    PT Home Exercise Plan Access Code: [email or web address]  Consulted and Agree with Plan of Care Patient             Patient will benefit from skilled therapeutic intervention in order to improve the following deficits and impairments:  Abnormal gait, Decreased activity tolerance, Decreased balance, Decreased strength, Difficulty walking  Visit Diagnosis: Unsteadiness on feet  Muscle weakness (generalized)  Foot drop, right  Other abnormalities of gait and mobility     Problem List Patient Active Problem List   Diagnosis Date  Noted   Spasticity 09/11/2021   Urinary retention 07/17/2021   S/P resection of meningioma 06/26/2021   S/P craniotomy 06/21/2021   Focal seizures ([place]) 05/06/2021   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) 08/18/2018   MACULAR DEGENERATION 02/29/2008   ALLERGY 02/29/2008   Nonspecific (abnormal) findings on radiological and other examination of body structure 12/26/2007   NONSPECIFIC ABNORM FIND RAD&OTH EXAM LUNG FIELD 12/26/2007   GALLSTONES 10/01/2007   ELEVATION, TRANSAMINASE/LDH LEVELS 09/06/2007   HEPATITIS NOS 09/01/2007   ABDOMINAL PAIN, EPIGASTRIC 09/01/2007    Cameron Sprang, PT, MPT [facility] [address] [place], [place], [address] Phone: [phone]   Fax:  [phone] 11/19/21, 3:16 PM   Name: James Olsen MRN: [id number] Date of Birth: Feb 26, 1946

## 2021-11-19 NOTE — Progress Notes (Signed)
Radiation Oncology         [phone] ________________________________  Outpatient Re-Consultation  Name: James Olsen MRN: [id number]  Date: 11/20/2021  DOB: 01-08-1946  CC:[name], MD  [name], MD   REFERRING PHYSICIAN: [name], MD  DIAGNOSIS:    ICD-10-CM   1. 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HCC)  D42.0      Atypical meningioma of brain, recurrent  CHIEF COMPLAINT: Here to discuss management of brain meningioma  Interval Since Last Radiation: 2 years, 9 months, and 27 days    12/09/2018 - 01/24/2019: Brain, Left Sagittal / 55.8 Gy in 31 fractions  HISTORY OF PRESENT ILLNESS::James Olsen is a 75 y.o. male who  was last seen by me for follow-up in early 2020 in regards to his atypical meningioma. To review from our last visit on 02/25/19, the patient was instructed to follow-up on an as needed basis with radiation oncology, and continue on with close follow up with neuro-oncology.   Since then, the patient proceeded to follow up with Dr. Mickeal Skinner on 10/17/2019 to discuss recent brain MRI. MRI on 10/11/19 (and 06/13/19) demonstrated the meningioma as stable in size. MRI of the brain on 06/13/19 did show some vasogenic edema in the left frontal white matter, though this remained stable when seen on MRI dated 10/10/21. Given that the patient was clinically and radiographically stable, he was instructed to return for 6 month follow-up imaging.  Follow-up MRI of the brain on 04/13/20 revealed the residual meningioma tumor to appear stable, measuring approximately 4.4 x 2.5 x 1.3 cm. Chronic atrophy and gliosis at the left frontoparietal vertex, subsequent to previous resection and radiation appreciated as well.  The patient again followed up with Dr. Mickeal Skinner on 04/16/20 following recent MRI. During which time, the patient reported  no new or progressive neurologic deficits other than occasional word finding difficulties (not new). Patient was again instructed  to return for 6 month follow-up imaging.  MRI on the brain on 01/04/21 showed the stable appearance of the residual nodular component of the meningioma tumor, measuring 17 x 13 x 12 mm. However, the amount of tumor within the superior sagittal sinus itself was noted to have grown, (by subjective estimation), by approximately 30-50%. Tumor growth extending posterior from the anterior margin of the intra sinus tumor was appreciated as well.  Given subtle changes demonstrated on MRI, (during a follow-up visit on 01/10/21), Dr.Vaslow recommended the patient to return to the clinic in 4 months for follow-up imaging. Dr. Mickeal Skinner also reviewed the patients case at the brain/spine tumor board the following week.   Several months later, the patient presented to the [facility] ED on 04/19/21 following a seizure like episode while driving. The patient detailed the seizure like activity as focused to his right arm. The patient reported pulling over to park his car after tremors started. Once he pulled over, the patient stated that his right arm began to rise up and his head uncontrollably faced his risen arm. This episode lasted for around 1 minute. CT of the head performed during ED course revealed no acute intracranial hemorrhage or evidence of acute infarction, though a significant mass effect was appreciated. Meningioma was also visualized as stable in size.  CT venogram on the head on 05/03/21 demonstrated the residual left frontoparietal vertex meningioma to invade and obliterate the adjacent superior sagittal sinus. The sinus appeared to remain patent more anteriorly and posteriorly, though irregular and mildly attenuated; noted as possibly reflective of  some nonocclusive chronic thrombus.  MRI of the brain on 05/03/21 revealed mild enlargement of the residual meningioma with invasion of the superior sagittal sinus and slightly increased edema. Enlargement of the 7 mm meningioma more inferiorly over the left  lateral cerebral convexity was appreciated as well.  The patient accordingly followed up with Dr. Mickeal Skinner on 05/06/21. During which time, the patient reported several episodes of "marching right leg numbness"  when standing following his seizure like episode. Based off of his recent episode and recent finding on MRI, Dr. Mickeal Skinner discussed the patient's case during the brain/spine tumor board that morning. Recommendation concluded during conference was for the patient to undergo re-resection of the meningioma.   The patient opted to proceed with tumor resectioning on 06/21/21 under the care of Dr. Cyndy Freeze. Pathology from the procedure revealed WHO grade 1 meningioma.  Please note that his original histology was WHO grade 2.  Post-procedure CT of the head on 06/22/21 demonstrated a small amount of residual hyperdensity at the right parafalcine region, possibly reflective of residual mass. A hypodensity was also seen in the superior left hemisphere, likely indicative of a recent infarct.  During follow-up with Dr. Mickeal Skinner on 08/06/21, the patient reported modest improvement in motor function since surgery on 06/21/21, though reported ambulating around his home in a walker and severe spasticity and cramping at night-time.   MRI of the brain on 08/18/21 demonstrated regional dural thickening postoperatively, including a mildly nodular area at the posterior aspect of the resection site which is indeterminate for residual tumor. Increased size of the separate meningioma was appreciated as well, located more inferiorly over the lateral cerebral convexity, measuring 11 mm. A decrease in left frontal lobe edema was also seen.  The patient began outpatient rehab in management of his muscle weakness/ unsteadiness on 09/24/21. (Ongoing).  MRI of the brain on 11/09/21 demonstrated persistent areas of dural thickening and enhancement more anteriorly (likely postoperative in nature). Slight increased enhancement was also  seen posteriorly and at the contralateral deep margin of the resection, noted to possibly reflect residual tumor.   During his most recent follow-up with Dr. Mickeal Skinner on 11/12/21, the patient reported very slow improvement, if any, with hemiparesis. Patient was also noted to be doing some walking with a cane or walker. He is working hard with PT and is determined. Patient also reported a bump on the top of his head which has become more prominent recently. Otherwise, no new or progressive symptoms were reported.  Given notable changes/progression at the primary site of disease, Dr. Mickeal Skinner stated that it is reasonable to move forward with radiosurgery to the progressive component.    PAST MEDICAL HISTORY:  has a past medical history of Arthritis, GERD (gastroesophageal reflux disease), Glaucoma, History of blood transfusion, History of radiation therapy (12/09/18- 01/24/2019), Macular degeneration, Stroke ([place]), and Wears glasses.    PAST SURGICAL HISTORY: Past Surgical History:  Procedure Laterality Date   APPLICATION OF CRANIAL NAVIGATION N/A 06/21/2021   Procedure: APPLICATION OF CRANIAL NAVIGATION;  Surgeon: [name], MD;  Location: [facility];  Service: Neurosurgery;  Laterality: N/A;   BRAIN SURGERY     CHOLECYSTECTOMY  2008   lap choli   COLONOSCOPY     CRANIOPLASTY N/A 10/29/2018   Procedure: CRANIOPLASTY;  Surgeon: [name], MD;  Location: [facility];  Service: Neurosurgery;  Laterality: N/A;  CRANIOPLASTY   CRANIOTOMY N/A 08/18/2018   Procedure: CRANIOTOMY TUMOR EXCISION;  Surgeon: [name], MD;  Location: [facility];  Service: Neurosurgery;  Laterality: N/A;  CRANIOTOMY TUMOR EXCISION   CRANIOTOMY Bilateral 06/21/2021   Procedure: Bilateral Craniotomy for tumor resection;  Surgeon: [name], MD;  Location: [facility];  Service: Neurosurgery;  Laterality: Bilateral;   DIAGNOSTIC LAPAROSCOPY     EYE SURGERY     laser surgery to relieve pressure   NAILBED REPAIR Left 05/04/2014   Procedure:  LEFT INDEX NAIL ABLATION V-Y FLAP COVERAGE;  Surgeon: [name]., MD;  Location: [facility];  Service: Orthopedics;  Laterality: Left;  index   PILONIDAL CYST EXCISION     x2   TONSILLECTOMY     VASECTOMY      FAMILY HISTORY: family history includes Congenital heart disease in his brother; Diabetes in his mother; Skin cancer in his brother.  SOCIAL HISTORY:  reports that he has never smoked. He has never used smokeless tobacco. He reports current alcohol use. He reports that he does not use drugs.  ALLERGIES: Patient has no known allergies.  MEDICATIONS:  Current Outpatient Medications  Medication Sig Dispense Refill   magnesium gluconate (MAGONATE) 500 MG tablet Take 500 mg by mouth at bedtime.     baclofen (LIORESAL) 10 MG tablet TAKE 1 TABLET BY MOUTH THREE TIMES A DAY 90 tablet 1   bethanechol (URECHOLINE) 50 MG tablet Take 1 tablet (50 mg total) by mouth 4 (four) times daily. 120 tablet 0   levETIRAcetam (KEPPRA) 500 MG tablet TAKE 1 TABLET BY MOUTH TWICE A DAY 60 tablet 3   Multiple Vitamins-Minerals (PRESERVISION AREDS 2) CAPS Take 1 capsule by mouth 2 (two) times daily.      silodosin (RAPAFLO) 8 MG CAPS capsule Take 8 mg by mouth daily with breakfast.     No current facility-administered medications for this encounter.    REVIEW OF SYSTEMS:  Notable for that above.   PHYSICAL EXAM:  height is '5\' 9"'  (1.753 m) and weight is 190 lb (86.2 kg). His temperature is 97.7 F (36.5 C). His blood pressure is 125/68 and his pulse is 74. His respiration is 20 and oxygen saturation is 98%.   General: Alert and oriented, in no acute distress   HEENT: There is a palpable lump over the midline top of his scalp.  Satisfactory healing of craniotomy scars.  Extraocular movements are intact. Oropharynx is clear. Heart: Regular in rate and rhythm with no murmurs, rubs, or gallops. Chest: Clear to auscultation bilaterally, with no rhonchi, wheezes, or  rales. Musculoskeletal: Reduced lower extremity strength bilaterally, especially in terms of hip flexion.  Right leg is weaker than left leg.  Reduced sensation in right leg.  Strength intact in arms bilaterally.  Reduced sensation in right upper arm.  He ambulates with a walker. Neurologic: Cranial nerves II through XII are grossly intact. Speech is fluent.   Psychiatric: Judgment and insight are intact. Affect is appropriate.  KPS = 70  100 - Normal; no complaints; no evidence of disease. 90   - Able to carry on normal activity; minor signs or symptoms of disease. 80   - Normal activity with effort; some signs or symptoms of disease. 83   - Cares for self; unable to carry on normal activity or to do active work. 60   - Requires occasional assistance, but is able to care for most of his personal needs. 50   - Requires considerable assistance and frequent medical care. 19   - Disabled; requires special care and assistance. 30   - Severely disabled; hospital admission is  indicated although death not imminent. 22   - Very sick; hospital admission necessary; active supportive treatment necessary. 10   - Moribund; fatal processes progressing rapidly. 0     - Dead  Karnofsky DA, [place], [place] and [place] [phone]) The use of the nitrogen mustards in the palliative treatment of carcinoma: with particular reference to bronchogenic carcinoma Cancer 1 634-56   LABORATORY DATA:  Lab Results  Component Value Date   WBC 5.4 07/15/2021   HGB 13.0 07/15/2021   HCT 40.2 07/15/2021   MCV 92.6 07/15/2021   PLT 217 07/15/2021   CMP     Component Value Date/Time   NA 132 (L) 07/15/2021 0506   K 3.7 07/15/2021 0506   CL 97 (L) 07/15/2021 0506   CO2 27 07/15/2021 0506   GLUCOSE 118 (H) 07/15/2021 0506   BUN 18 07/15/2021 0506   CREATININE 0.85 07/15/2021 0506   CALCIUM 9.0 07/15/2021 0506   PROT 5.2 (L) 06/27/2021 0516   ALBUMIN 2.9 (L) 06/27/2021 0516   AST 21 06/27/2021 0516    ALT 46 (H) 06/27/2021 0516   ALKPHOS 55 06/27/2021 0516   BILITOT 0.8 06/27/2021 0516   GFRNONAA >60 07/15/2021 0506   GFRAA >60 11/26/2018 1231         RADIOGRAPHY: MR BRAIN W WO CONTRAST  Result Date: 11/11/2021 CLINICAL DATA:  Meningioma post resection, follow-up EXAM: MRI HEAD WITHOUT AND WITH CONTRAST TECHNIQUE: Multiplanar, multiecho pulse sequences of the brain and surrounding structures were obtained without and with intravenous contrast. CONTRAST:  58m MULTIHANCE GADOBENATE DIMEGLUMINE 529 MG/ML IV SOLN COMPARISON:  08/18/2021 FINDINGS: Brain: Postoperative changes are again identified at the vertex. There is regional dural thickening and enhancement. The enhancement appears more plaque-like anteriorly (for example series 14, image 20). There is greater nodularity posteriorly and enhancement in this region has slightly increased (compare series 14, image 11 versus is series 18, image 10 on prior). Additionally, there is area of curvilinear enhancement at the deep margin of the resection cavity on the right at demonstrates thicker enhancement presently (series 14, image 17 versus series 18, image 17 previously). Extent of underlying T2 FLAIR hyperintensity is not significantly changed. Further increase in size of meningioma along the posterior left frontal convexity now measuring 1.8 x 1.2 cm (previously 1.1 x 0.7 cm). No underlying edema. No acute infarction or hemorrhage. Ventricles are stable in size. Stable findings of probable chronic microvascular ischemic changes in the cerebral white matter. Vascular: Major vessel flow voids at the skull base are preserved. Skull and upper cervical spine: Normal marrow signal is preserved. Sinuses/Orbits: Paranasal sinuses are aerated. Orbits are unremarkable. Other: Sella is unremarkable.  Mastoid air cells are clear. IMPRESSION: Persistent areas of dural thickening and enhancement. Enhancement more anteriorly appears postoperative. There is slightly  increased enhancement posteriorly and at the contralateral deep margin of the resection that could reflect residual tumor. Electronically Signed   By: PMacy MisM.D.   On: 11/11/2021 09:14      IMPRESSION/PLAN: This is a wonderful 75year old gentleman with multiply recurrent meningioma despite surgery and radiation and salvage surgery.  This is a complex case that I discussed personally with a colleague of mine at a local university who specializes in CNS malignancies.  Today I shared the consensus that I reached with my colleague. I talked to the patient about the findings and work-up thus far.  We discussed the patient's diagnosis of recurrent meningioma, with evidence of a new left-sided cerebral lesion  on the periphery of his previous radiation fields and concern for new nodularity where he recently underwent surgical salvage in the sagittal field.   We discussed salvage treatment available at this juncture, highlighting the role of stereotactic radiosurgery in the management.  We discussed the available radiation techniques, and focused on the details of logistics and delivery.     He understands that he has a serious disease that is likely to be chronic and progressive over time and that eventually may take his life.  The chance of cure is small.  We discussed the value of targeting active lesions before they get larger so that we can spare the amount of normal brain that is exposed to radiation/reirradiation.  I recommend that we give targeted stereotactic radiosurgery to the new lesion and likely treat the suspicious nodularity in the surgical bed as long as this remains suspicious on an upcoming stereotactic radiosurgery 3 Tesla planning MRI.  I will keep my fields small to reduce the risk of side effects while he and I understand that elsewhere /adjacent recurrences are likely and may need to be targeted in the future.     We discussed the risks, benefits, and side effects of salvage  radiosurgery. Side effects may include but not necessarily be limited to: Injury to the brain, radionecrosis, fatigue, hair loss.  No guarantees of treatment were given. A consent form was signed and placed in the patient's medical record. The patient was encouraged to ask questions that I answered to the best of my ability.  He and his wife are enthusiastic about proceeding.  He met our navigator today who will coordinate care - anticipate treatment in the next few weeks as scheduled allows.   On date of service, in total, I spent 60 minutes on this encounter. Patient was seen in person.   __________________________________________   Eppie Gibson, MD  This document serves as a record of services personally performed by Eppie Gibson, MD. It was created on her behalf by Roney Mans, a trained medical scribe. The creation of this record is based on the scribe's personal observations and the provider's statements to them. This document has been checked and approved by the attending provider.

## 2021-11-20 ENCOUNTER — Encounter: Payer: Self-pay | Admitting: Radiation Oncology

## 2021-11-20 ENCOUNTER — Other Ambulatory Visit: Payer: Self-pay | Admitting: Radiation Therapy

## 2021-11-20 ENCOUNTER — Ambulatory Visit
Admission: RE | Admit: 2021-11-20 | Discharge: 2021-11-20 | Disposition: A | Discharge status: Home / Self Care | Payer: Medicare Other | Source: Ambulatory Visit | Attending: Radiation Oncology | Admitting: Radiation Oncology

## 2021-11-20 VITALS — BP 125/68 | HR 74 | Temp 97.7°F | Resp 20 | Ht 69.0 in | Wt 190.0 lb

## 2021-11-20 DIAGNOSIS — M129 Arthropathy, unspecified: Secondary | ICD-10-CM | POA: Diagnosis not present

## 2021-11-20 DIAGNOSIS — Z8673 Personal history of transient ischemic attack (TIA), and cerebral infarction without residual deficits: Secondary | ICD-10-CM | POA: Diagnosis not present

## 2021-11-20 DIAGNOSIS — D420 Neoplasm of uncertain behavior of cerebral meninges: Secondary | ICD-10-CM | POA: Insufficient documentation

## 2021-11-20 DIAGNOSIS — Z85028 Personal history of other malignant neoplasm of stomach: Secondary | ICD-10-CM | POA: Insufficient documentation

## 2021-11-20 DIAGNOSIS — Z9049 Acquired absence of other specified parts of digestive tract: Secondary | ICD-10-CM | POA: Diagnosis not present

## 2021-11-20 DIAGNOSIS — K219 Gastro-esophageal reflux disease without esophagitis: Secondary | ICD-10-CM | POA: Insufficient documentation

## 2021-11-20 DIAGNOSIS — Z923 Personal history of irradiation: Secondary | ICD-10-CM | POA: Diagnosis not present

## 2021-11-20 DIAGNOSIS — D320 Benign neoplasm of cerebral meninges: Secondary | ICD-10-CM

## 2021-11-20 DIAGNOSIS — Z79899 Other long term (current) drug therapy: Secondary | ICD-10-CM | POA: Insufficient documentation

## 2021-11-21 ENCOUNTER — Other Ambulatory Visit: Payer: Self-pay

## 2021-11-21 ENCOUNTER — Ambulatory Visit: Payer: Medicare Other | Attending: Family Medicine | Admitting: Physical Therapy

## 2021-11-21 ENCOUNTER — Encounter: Payer: Self-pay | Admitting: Physical Therapy

## 2021-11-21 DIAGNOSIS — M6281 Muscle weakness (generalized): Secondary | ICD-10-CM | POA: Insufficient documentation

## 2021-11-21 DIAGNOSIS — R2681 Unsteadiness on feet: Secondary | ICD-10-CM | POA: Diagnosis present

## 2021-11-21 DIAGNOSIS — M21371 Foot drop, right foot: Secondary | ICD-10-CM | POA: Diagnosis present

## 2021-11-21 DIAGNOSIS — R2689 Other abnormalities of gait and mobility: Secondary | ICD-10-CM | POA: Insufficient documentation

## 2021-11-21 NOTE — Therapy (Signed)
[facility] [address] [place], [place], [address] Phone: [phone]   Fax:  [phone]  Physical Therapy Treatment  Patient Details  Name: James Olsen MRN: [id number] Date of Birth: 05-14-1946 Referring Provider (PT): Meredith Staggers, MD   Encounter Date: 11/21/2021   PT End of Session - 11/21/21 1301     Visit Number 16    Number of Visits 17    Date for PT Re-Evaluation 11/24/21    Authorization Type Medicare and [place] - 10th visit PN and KX modifier required    Progress Note Due on Visit 20    PT Start Time 1150    PT Stop Time 1253    PT Time Calculation (min) 63 min    Equipment Utilized During Treatment Gait belt;Other (comment)   R AFO   Activity Tolerance Patient tolerated treatment well    Behavior During Therapy WFL for tasks assessed/performed             Past Medical History:  Diagnosis Date   Arthritis    GERD (gastroesophageal reflux disease)    Glaucoma    pt denies.    History of blood transfusion     5 units after previous surgery   History of radiation therapy 12/09/18- 01/24/2019   Left sagittal brain, 11.8 Gy in 31 fractions for a total dose of 55.8 Gy   Macular degeneration    Stroke [facility])    after surgery in August 2019 mostly recovered still some weakness in left arm   Wears glasses     Past Surgical History:  Procedure Laterality Date   APPLICATION OF CRANIAL NAVIGATION N/A 06/21/2021   Procedure: APPLICATION OF CRANIAL NAVIGATION;  Surgeon: [name], MD;  Location: [place];  Service: Neurosurgery;  Laterality: N/A;   BRAIN SURGERY     CHOLECYSTECTOMY  2008   lap choli   COLONOSCOPY     CRANIOPLASTY N/A 10/29/2018   Procedure: CRANIOPLASTY;  Surgeon: [name], MD;  Location: [place];  Service: Neurosurgery;  Laterality: N/A;  CRANIOPLASTY   CRANIOTOMY N/A 08/18/2018   Procedure: CRANIOTOMY TUMOR EXCISION;  Surgeon: [name], MD;  Location: [place];   Service: Neurosurgery;  Laterality: N/A;  CRANIOTOMY TUMOR EXCISION   CRANIOTOMY Bilateral 06/21/2021   Procedure: Bilateral Craniotomy for tumor resection;  Surgeon: [name], MD;  Location: [place];  Service: Neurosurgery;  Laterality: Bilateral;   DIAGNOSTIC LAPAROSCOPY     EYE SURGERY     laser surgery to relieve pressure   NAILBED REPAIR Left 05/04/2014   Procedure: LEFT INDEX NAIL ABLATION V-Y FLAP COVERAGE;  Surgeon: [name]., MD;  Location: [place];  Service: Orthopedics;  Laterality: Left;  index   PILONIDAL CYST EXCISION     x2   TONSILLECTOMY     VASECTOMY      There were no vitals filed for this visit.   Subjective Assessment - 11/21/21 1156     Subjective Pt has radiation treatments coming up and may want to hold PT treatments in late December or January till after radiation.    Pertinent History OA, h/o blood transfusion, h/o radiation therapy, macular degeneration, CVA, craniotomy > cranioplasty and resection of meningioma, R hemiparesis, focal seizures, urinary retention    Patient Stated Goals Walking, gain more strength in RLE to be able to drive in 6 months if seizure free    Currently in Pain? No/denies  [facility] PT Assessment - 11/21/21 0001       Ambulation/Gait   Ambulation/Gait Yes    Ambulation/Gait Assistance 6: Modified independent (Device/Increase time);5: Supervision    Ambulation/Gait Assistance Details working on R foot clearance and toe off    Ambulation Distance (Feet) 250 Feet    Assistive device Rolling walker;Other (Comment)   R AFO   Gait Pattern Step-through pattern;Decreased arm swing - right;Decreased stride length;Decreased hip/knee flexion - right;Decreased dorsiflexion - right;Poor foot clearance - right    Ambulation Surface Level;Indoor;Outdoor;Paved;Gravel;Grass      Mini-BESTest   Sit To Stand Moderate: Comes to stand WITH use of hands on first attempt.    Rise to Toes < 3 s.    Stand  on one leg (left) Moderate: < 20 s    Stand on one leg (right) Moderate: < 20 s    Stand on one leg - lowest score 1    Compensatory Stepping Correction - Forward Normal: Recovers independently with a single, large step (second realignement is allowed).    Compensatory Stepping Correction - Backward Moderate: More than one step is required to recover equilibrium    Compensatory Stepping Correction - Left Lateral Severe: Falls, or cannot step    Compensatory Stepping Correction - Right Lateral Severe:  Falls, or cannot step    Stepping Corredtion Lateral - lowest score 0    Stance - Feet together, eyes open, firm surface  Normal: 30s    Stance - Feet together, eyes closed, foam surface  Severe: Unable    Incline - Eyes Closed Normal: Stands independently 30s and aligns with gravity    Change in Gait Speed Normal: Significantly changes walkling speed without imbalance    Walk with head turns - Horizontal Normal: performs head turns with no change in gait speed and good balance    Walk with pivot turns Moderate:Turns with feet close SLOW (>4 steps) with good balance.    Step over obstacles Moderate: Steps over box but touches box OR displays cautious behavior by slowing gait.    Timed UP & GO with Dual Task Moderate: Dual Task affects either counting OR walking (>10%) when compared to the TUG without Dual Task.    Mini-BEST total score 16                           [facility] Adult PT Treatment/Exercise - 11/21/21 0001       Ambulation/Gait   Gait velocity 2.97 ft/sec   with walker, 11.95 ft/sec HHA+1                    PT Education - 11/21/21 1300     Education Details discussed goals checked and POC; pt willing to continue with PT but may need to be put on hold at some point when doing radiation treatments.    Person(s) Educated Patient    Methods Explanation              PT Short Term Goals - 10/25/21 1559       PT SHORT TERM GOAL #1   Title Pt will  demonstrate independence with initial HEP    Baseline Pt reports compliance with HEP    Status Achieved      PT SHORT TERM GOAL #2   Title Pt will demonstrate improved functional LE strength as indicated by ability to perform 8-9 reps of sit <> stand WITHOUT use of UE in 30 seconds  Baseline 10/25/2021 8 in 30 sec with no UE assist    Status Achieved      PT SHORT TERM GOAL #3   Title Pt will participate in further assessment of balance impairments with Mini Best test    Baseline Completed on 10/01/2021, pt scored 9 on Mini Best Test    Status Achieved      PT SHORT TERM GOAL #4   Title Pt will improve gait velocity with RW to >/= 2.62 ft/sec    Baseline 10/25/2021 1.9 ft/sec, improved just not to goal level    Status Partially Met      PT SHORT TERM GOAL #5   Title Pt will ambulate without AD x 115' over level indoor surfaces with supervision and no LOB    Baseline 10/25/2021 Pt ambulated 115' with straight tip cane with quad tip and min guard, have not been working on gait with no AD at this time in therapy    Status Partially Met               PT Long Term Goals - 11/21/21 1301       PT LONG TERM GOAL #1   Title Pt will demonstrate independence with final HEP    Baseline 11/21/21 Pt reports independence with HEP; pt did not demonstrate due to time contraints.    Time 8    Period Weeks    Status Achieved      PT LONG TERM GOAL #2   Title Pt will increase gait velocity without AD to >/= 2.62 ft/sec without LOB    Baseline 11/21/21 with RW and AFO 2.97 ft/sec, with HHA+1 and AFO 2.51f/sec.  Not met due to pt requiring an AD for balance at this time.    Time 8    Period Weeks    Status Not Met      PT LONG TERM GOAL #3   Title Pt will increase score on Mini-Best test by 5 points to indicate decreased falls risk    Baseline 11/21/21 16/30, met baseline 9/30.    Time 8    Period Weeks    Status Achieved      PT LONG TERM GOAL #4   Title Pt will ambulate x 250'  outside over uneven pavement, negotiate ramp, curb and 12 stairs with LRAD MOD I.    Baseline 11/21/21 Met with RW and R AFO.    Time 8    Period Weeks    Status Achieved                   Plan - 11/21/21 1306     Clinical Impression Statement Pt is making good progress meeting majority of LSelma  Pt continues to use RW for indoor and outdoor gait but has progressed with speed and stability.    Personal Factors and Comorbidities Comorbidity 3+;Past/Current Experience;Social Background;Transportation    Comorbidities OA, h/o blood transfusion, h/o radiation therapy, macular degeneration, CVA, craniotomy > cranioplasty and resection of meningioma, R hemiparesis, focal seizures, urinary retention    Examination-Activity Limitations Locomotion Level;Stairs;Stand    Examination-Participation Restrictions Community Activity;Driving;Yard Work    SMerchant navy officerEvolving/Moderate complexity    Rehab Potential Good    PT Frequency 2x / week    PT Duration 8 weeks    PT Treatment/Interventions ADLs/Self Care Home Management;Aquatic Therapy;DME Instruction;Gait training;Stair training;Functional mobility training;Therapeutic activities;Therapeutic exercise;Balance training;Neuromuscular re-education;Patient/family education;Passive range of motion    PT Next Visit Plan DISCUSS scheduling due to  radiation treatments. recert, continue high intensity circuit and gait training (HR to stay within 102-132-can do any functional task, if becoming easy, weight him, add speed, etc) No estim due to CA and seizures. gait with cane with quad tip (indoors and outdoors), gait without device in therapy, obstacles, stairs, curb, ramp,  balance training and LE strengthening.    PT Home Exercise Plan Access Code: [email or web address]    Consulted and Agree with Plan of Care Patient             Patient will benefit from skilled therapeutic intervention in order to improve the following deficits and  impairments:  Abnormal gait, Decreased activity tolerance, Decreased balance, Decreased strength, Difficulty walking  Visit Diagnosis: Unsteadiness on feet  Muscle weakness (generalized)  Foot drop, right  Other abnormalities of gait and mobility     Problem List Patient Active Problem List   Diagnosis Date Noted   Spasticity 09/11/2021   Urinary retention 07/17/2021   S/P resection of meningioma 06/26/2021   S/P craniotomy 06/21/2021   Focal seizures ([place]) 05/06/2021   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   Meningioma ([place]) 10/29/2018   Other abnormalities of gait and mobility 09/17/2018   Acute cerebral infarction ([facility])    Thrombocytopenia ([facility])    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 ([place])    Acute blood loss anemia    Generalized OA    Gastroesophageal reflux disease    Seizure prophylaxis    Leukocytosis    Tumor 08/18/2018   Meningioma determined by biopsy of brain ([place]) 08/18/2018   MACULAR DEGENERATION 02/29/2008   ALLERGY 02/29/2008   Nonspecific (abnormal) findings on radiological and other examination of body structure 12/26/2007   NONSPECIFIC ABNORM FIND RAD&OTH EXAM LUNG FIELD 12/26/2007   GALLSTONES 10/01/2007   ELEVATION, TRANSAMINASE/LDH LEVELS 09/06/2007   HEPATITIS NOS 09/01/2007   ABDOMINAL PAIN, EPIGASTRIC 09/01/2007    Bjorn Loser, PTA  11/21/21, 1:09 PM   [facility] [address] [place], [place], [address] Phone: [phone]   Fax:  [phone]  Name: BERL BONFANTI MRN: [id number] Date of Birth: [birth year]

## 2021-11-22 ENCOUNTER — Other Ambulatory Visit: Payer: Self-pay | Admitting: Radiation Oncology

## 2021-11-22 DIAGNOSIS — D320 Benign neoplasm of cerebral meninges: Secondary | ICD-10-CM

## 2021-11-22 MED ORDER — LORAZEPAM 1 MG PO TABS: ORAL_TABLET | ORAL | 0 refills | Status: DC

## 2021-11-26 ENCOUNTER — Ambulatory Visit (INDEPENDENT_AMBULATORY_CARE_PROVIDER_SITE_OTHER): Payer: Medicare Other | Admitting: Physician Assistant

## 2021-11-26 ENCOUNTER — Other Ambulatory Visit: Payer: Self-pay

## 2021-11-26 ENCOUNTER — Encounter: Payer: Self-pay | Admitting: Physician Assistant

## 2021-11-26 VITALS — BP 118/73 | HR 69 | Temp 97.0°F | Ht 69.0 in | Wt 188.2 lb

## 2021-11-26 DIAGNOSIS — R7303 Prediabetes: Secondary | ICD-10-CM | POA: Diagnosis not present

## 2021-11-26 DIAGNOSIS — D329 Benign neoplasm of meninges, unspecified: Secondary | ICD-10-CM

## 2021-11-26 NOTE — Patient Instructions (Signed)
Very good to meet you!  Your Hemoglobin A1c is 5.8. We can continue to monitor this, but no meds required at this time.  Please reach out to your oncologist about support groups / resources to help in this time.  Call sooner if any concerns.

## 2021-11-26 NOTE — Progress Notes (Signed)
Subjective:    Patient ID: James Olsen, male    DOB: [birth year], 75 y.o.   MRN: [id number]  Chief Complaint  Patient presents with   [place]    HPI 75 y.o. patient presents today for new patient establishment with me.  Patient was previously established with [facility] with Dr. Kathryne Eriksson, but pt states he is retiring soon.  Current Care Team: Dr. Mickeal Skinner - Neuro-oncologist  Dr. Isidore Moos - Radiology oncologist Dr. Naaman Plummer - Phys Medicine  [facility]    Acute Concerns: -Recently told he had prediabetic numbers & wants Ha1c rechecked.  -Slow to process language and thoughts; quick to anger and easily frustrated because this time healing process has been slower. Certainly has some "down days."  Chronic Concerns: See PMH listed below, as well as A/P for details on issues we specifically discussed during today's visit.      Past Medical History:  Diagnosis Date   Allergy    Arthritis    Cataract    GERD (gastroesophageal reflux disease)    Glaucoma    pt denies.    History of blood transfusion     5 units after previous surgery   History of radiation therapy 12/09/18- 01/24/2019   Left sagittal brain, 11.8 Gy in 31 fractions for a total dose of 55.8 Gy   Hyperlipidemia    Macular degeneration    Seizures ([place])    Stroke [facility])    after surgery in August 2019 mostly recovered still some weakness in left arm   Wears glasses     Past Surgical History:  Procedure Laterality Date   APPLICATION OF CRANIAL NAVIGATION N/A 06/21/2021   Procedure: APPLICATION OF CRANIAL NAVIGATION;  Surgeon: [name], MD;  Location: [place];  Service: Neurosurgery;  Laterality: N/A;   BRAIN SURGERY     CHOLECYSTECTOMY  2008   lap choli   COLONOSCOPY     CRANIOPLASTY N/A 10/29/2018   Procedure: CRANIOPLASTY;  Surgeon: [name], MD;  Location: [place];  Service: Neurosurgery;  Laterality: N/A;  CRANIOPLASTY   CRANIOTOMY N/A 08/18/2018   Procedure: CRANIOTOMY  TUMOR EXCISION;  Surgeon: [name], MD;  Location: [place];  Service: Neurosurgery;  Laterality: N/A;  CRANIOTOMY TUMOR EXCISION   CRANIOTOMY Bilateral 06/21/2021   Procedure: Bilateral Craniotomy for tumor resection;  Surgeon: [name], MD;  [facility];  Service: Neurosurgery;  Laterality: Bilateral;   DIAGNOSTIC LAPAROSCOPY     EYE SURGERY     laser surgery to relieve pressure   NAILBED REPAIR Left 05/04/2014   Procedure: LEFT INDEX NAIL ABLATION V-Y FLAP COVERAGE;  Surgeon: [name]., MD;  Location: [place];  Service: Orthopedics;  Laterality: Left;  index   PILONIDAL CYST EXCISION     x2   TONSILLECTOMY     VASECTOMY      Family History  Problem Relation Age of Onset   Diabetes Mother    Skin cancer Brother    Congenital heart disease Brother     Social History   Tobacco Use   Smoking status: Never   Smokeless tobacco: Never  Vaping Use   Vaping Use: Never used  Substance Use Topics   Alcohol use: Yes    Comment: occasionally   Drug use: No     No Known Allergies  Review of Systems NEGATIVE UNLESS OTHERWISE INDICATED IN HPI      Objective:     BP 118/73   Pulse 69  Temp (!) 97 F (36.1 C)   Ht 5\' 9"  (1.753 m)   Wt 188 lb 3.2 oz (85.4 kg)   SpO2 99%   BMI 27.79 kg/m   Wt Readings from Last 3 Encounters:  11/26/21 188 lb 3.2 oz (85.4 kg)  11/20/21 190 lb (86.2 kg)  09/11/21 181 lb (82.1 kg)    BP Readings from Last 3 Encounters:  11/26/21 118/73  11/20/21 125/68  09/11/21 112/71     Physical Exam Vitals and nursing note reviewed.  Constitutional:      General: He is not in acute distress.    Appearance: Normal appearance. He is not toxic-appearing.     Comments: Using a walker  HENT:     Head: Normocephalic and atraumatic.     Comments: Meningioma noted on vertex    Right Ear: Tympanic membrane, ear canal and external ear normal.     Left Ear: Tympanic membrane, ear canal and external ear normal.      Nose: Nose normal.     Mouth/Throat:     Mouth: Mucous membranes are moist.     Pharynx: Oropharynx is clear.  Eyes:     Extraocular Movements: Extraocular movements intact.     Conjunctiva/sclera: Conjunctivae normal.     Pupils: Pupils are equal, round, and reactive to light.  Cardiovascular:     Rate and Rhythm: Normal rate and regular rhythm.     Pulses: Normal pulses.     Heart sounds: Normal heart sounds.  Pulmonary:     Effort: Pulmonary effort is normal.     Breath sounds: Normal breath sounds.  Abdominal:     General: Abdomen is flat. Bowel sounds are normal.     Palpations: Abdomen is soft.     Tenderness: There is no abdominal tenderness.  Musculoskeletal:        General: Normal range of motion.     Cervical back: Normal range of motion and neck supple.  Skin:    General: Skin is warm and dry.  Neurological:     General: No focal deficit present.     Mental Status: He is alert and oriented to person, place, and time.     Comments: Slow to process speech at times  Psychiatric:        Mood and Affect: Mood normal.        Behavior: Behavior normal.       Assessment & Plan:   Problem List Items Addressed This Visit       Nervous and Auditory   Meningioma ([place])   Other Visit Diagnoses     Prediabetes    -  Primary   Relevant Orders   POCT HgB A1C      1. Prediabetes -Reassured the patient that his hemoglobin A1c is 5.8.  Discussed with him that no medications are required, but he just needs to continue to work on healthy nutrition and exercise as he is allowed, although this is certainly limited due to medical conditions at this time. -Plan to follow-up in about 3 months with him.  2. Meningioma ([place]) -His most recent surgery was a bilateral craniotomy on June 21, 2021 for tumor resection.  Healing has been slower at this time.  He continues to have a lot of unsteadiness on his feet and he has been following with physical therapy. -Radiation is being  planned. -He admits that he is struggling with his diagnoses at times and I encouraged him to reach out to his cancer  team's for support groups.  This note was prepared with assistance of Systems analyst. Occasional wrong-word or sound-a-like substitutions may have occurred due to the inherent limitations of voice recognition software.  Time Spent: 38 minutes of total time was spent on the date of the encounter performing the following actions: chart review prior to seeing the patient, obtaining history, performing a medically necessary exam, counseling on the treatment plan, placing orders, and documenting in our EHR.    Laikynn Pollio M Aaiden Depoy, PA-C

## 2021-11-29 ENCOUNTER — Encounter: Payer: Self-pay | Admitting: Physical Therapy

## 2021-11-29 ENCOUNTER — Other Ambulatory Visit: Payer: Self-pay

## 2021-11-29 ENCOUNTER — Ambulatory Visit: Payer: Medicare Other | Admitting: Physical Therapy

## 2021-11-29 DIAGNOSIS — R2689 Other abnormalities of gait and mobility: Secondary | ICD-10-CM

## 2021-11-29 DIAGNOSIS — R2681 Unsteadiness on feet: Secondary | ICD-10-CM | POA: Diagnosis not present

## 2021-11-29 DIAGNOSIS — M21371 Foot drop, right foot: Secondary | ICD-10-CM

## 2021-11-29 DIAGNOSIS — M6281 Muscle weakness (generalized): Secondary | ICD-10-CM

## 2021-11-29 LAB — POCT GLYCOSYLATED HEMOGLOBIN (HGB A1C): Hemoglobin A1C: 5.8 % — AB (ref 4.0–5.6)

## 2021-11-29 NOTE — Therapy (Signed)
[facility] [address] [place], [place], [address] Phone: [phone]   Fax:  [phone]  Physical Therapy Treatment  Patient Details  Name: James Olsen MRN: [id number] Date of Birth: 09-19-1946 Referring Provider (PT): Meredith Staggers, MD   Encounter Date: 11/29/2021   PT End of Session - 11/29/21 1541     Visit Number 17    Number of Visits 17    Date for PT Re-Evaluation 11/24/21    Authorization Type Medicare and [place] - 10th visit PN and KX modifier required    Progress Note Due on Visit 20    PT Start Time 1535    PT Stop Time 1615    PT Time Calculation (min) 40 min    Equipment Utilized During Treatment Gait belt;Other (comment)   R AFO   Activity Tolerance Patient tolerated treatment well    Behavior During Therapy WFL for tasks assessed/performed             Past Medical History:  Diagnosis Date   Allergy    Arthritis    Cataract    GERD (gastroesophageal reflux disease)    Glaucoma    pt denies.    History of blood transfusion     5 units after previous surgery   History of radiation therapy 12/09/18- 01/24/2019   Left sagittal brain, 11.8 Gy in 31 fractions for a total dose of 55.8 Gy   Hyperlipidemia    Macular degeneration    Seizures ([place])    Stroke [facility])    after surgery in August 2019 mostly recovered still some weakness in left arm   Wears glasses     Past Surgical History:  Procedure Laterality Date   APPLICATION OF CRANIAL NAVIGATION N/A 06/21/2021   Procedure: APPLICATION OF CRANIAL NAVIGATION;  Surgeon: [name], MD;  Location: [place];  Service: Neurosurgery;  Laterality: N/A;   BRAIN SURGERY     CHOLECYSTECTOMY  2008   lap choli   COLONOSCOPY     CRANIOPLASTY N/A 10/29/2018   Procedure: CRANIOPLASTY;  Surgeon: [name], MD;  Location: [place];  Service: Neurosurgery;  Laterality: N/A;  CRANIOPLASTY   CRANIOTOMY N/A 08/18/2018   Procedure: CRANIOTOMY TUMOR  EXCISION;  Surgeon: [name], MD;  Location: [place];  Service: Neurosurgery;  Laterality: N/A;  CRANIOTOMY TUMOR EXCISION   CRANIOTOMY Bilateral 06/21/2021   Procedure: Bilateral Craniotomy for tumor resection;  Surgeon: [name], MD;  Location: [place];  Service: Neurosurgery;  Laterality: Bilateral;   DIAGNOSTIC LAPAROSCOPY     EYE SURGERY     laser surgery to relieve pressure   NAILBED REPAIR Left 05/04/2014   Procedure: LEFT INDEX NAIL ABLATION V-Y FLAP COVERAGE;  Surgeon: [name]., MD;  Location: [place];  Service: Orthopedics;  Laterality: Left;  index   PILONIDAL CYST EXCISION     x2   TONSILLECTOMY     VASECTOMY      There were no vitals filed for this visit.   Subjective Assessment - 11/29/21 1539     Subjective Goes next Tues for IV sim, CT and MRI of head. Based on MRI they will determine the plan of care for dealing with the tumors. Based on the plan they come up with will determine whether he continues with PT or goes on hold. No falls or pain to report. Has not been able to practice with cane at home as both his cats chase it/attack it.  Pertinent History OA, h/o blood transfusion, h/o radiation therapy, macular degeneration, CVA, craniotomy > cranioplasty and resection of meningioma, R hemiparesis, focal seizures, urinary retention    Limitations Standing;Walking;House hold activities    Patient Stated Goals Walking, gain more strength in RLE to be able to drive in 6 months if seizure free    Currently in Pain? No/denies                     [facility] Adult PT Treatment/Exercise - 11/29/21 1543       Transfers   Transfers Sit to Stand;Stand to Sit    Sit to Stand 5: Supervision;4: Min guard    Stand to Sit 5: Supervision      Ambulation/Gait   Ambulation/Gait Yes    Ambulation/Gait Assistance 6: Modified independent (Device/Increase time);5: Supervision    Ambulation/Gait Assistance Details Mod I with walker. use of cane in  session with min guard to min assist depending on actiivty    Ambulation Distance (Feet) --   around clinic with session   Assistive device Straight cane   rubber quad tip   Gait Pattern Step-through pattern;Decreased arm swing - right;Decreased stride length;Decreased hip/knee flexion - right;Decreased dorsiflexion - right;Poor foot clearance - right    Ambulation Surface Level;Indoor      Neuro Re-ed    Neuro Re-ed Details  for balance/muscle re-ed: gait around track with cane/AFO working on scanning all directions randomly with min guard to min assist due to staggering at times.                 Balance Exercises - 11/29/21 1552       Balance Exercises: Standing   Rockerboard Anterior/posterior;Lateral;EO;EC;30 seconds;Other reps (comment);Limitations;Intermittent UE support    Rockerboard Limitations performed both ways on balance board: With EO alternating UE raises, progressing to bil UE raises for 5-6 reps with min guard to min assist, cues on posture/weight shifiting. then with EC 30 sec's x 3 reps no UE support with min guard to min assist for balance. cues on posture/weight shifting to assist with balance.    Partial Tandem Stance Eyes open;Eyes closed;Foam/compliant surface;3 reps;20 secs;Limitations    Partial Tandem Stance Limitations x1 rep each foot foward with EO, then 2 reps each foot forward with EC. min guard to min assist for balance, increased assist with EC. cues on posture to assist with balance.                  PT Short Term Goals - 10/25/21 1559       PT SHORT TERM GOAL #1   Title Pt will demonstrate independence with initial HEP    Baseline Pt reports compliance with HEP    Status Achieved      PT SHORT TERM GOAL #2   Title Pt will demonstrate improved functional LE strength as indicated by ability to perform 8-9 reps of sit <> stand WITHOUT use of UE in 30 seconds    Baseline 10/25/2021 8 in 30 sec with no UE assist    Status Achieved       PT SHORT TERM GOAL #3   Title Pt will participate in further assessment of balance impairments with Mini Best test    Baseline Completed on 10/01/2021, pt scored 9 on Mini Best Test    Status Achieved      PT SHORT TERM GOAL #4   Title Pt will improve gait velocity with RW to >/= 2.62 ft/sec  Baseline 10/25/2021 1.9 ft/sec, improved just not to goal level    Status Partially Met      PT SHORT TERM GOAL #5   Title Pt will ambulate without AD x 115' over level indoor surfaces with supervision and no LOB    Baseline 10/25/2021 Pt ambulated 115' with straight tip cane with quad tip and min guard, have not been working on gait with no AD at this time in therapy    Status Partially Met               PT Long Term Goals - 11/21/21 1301       PT LONG TERM GOAL #1   Title Pt will demonstrate independence with final HEP    Baseline 11/21/21 Pt reports independence with HEP; pt did not demonstrate due to time contraints.    Time 8    Period Weeks    Status Achieved      PT LONG TERM GOAL #2   Title Pt will increase gait velocity without AD to >/= 2.62 ft/sec without LOB    Baseline 11/21/21 with RW and AFO 2.97 ft/sec, with HHA+1 and AFO 2.40f/sec.  Not met due to pt requiring an AD for balance at this time.    Time 8    Period Weeks    Status Not Met      PT LONG TERM GOAL #3   Title Pt will increase score on Mini-Best test by 5 points to indicate decreased falls risk    Baseline 11/21/21 16/30, met baseline 9/30.    Time 8    Period Weeks    Status Achieved      PT LONG TERM GOAL #4   Title Pt will ambulate x 250' outside over uneven pavement, negotiate ramp, curb and 12 stairs with LRAD MOD I.    Baseline 11/21/21 Met with RW and R AFO.    Time 8    Period Weeks    Status Achieved                   Plan - 11/29/21 1542     Clinical Impression Statement Today's skilled session continued to focus on gait/dynamic gait with cane and balance training with rest  breaks taken as needed. No other issues noted or reported in session. The pt was challenged with complaint surfaces this session with no UE support. The pt is making steady progress and should benefit from continued PT to progress toward unmet goals.    Personal Factors and Comorbidities Comorbidity 3+;Past/Current Experience;Social Background;Transportation    Comorbidities OA, h/o blood transfusion, h/o radiation therapy, macular degeneration, CVA, craniotomy > cranioplasty and resection of meningioma, R hemiparesis, focal seizures, urinary retention    Examination-Activity Limitations Locomotion Level;Stairs;Stand    Examination-Participation Restrictions Community Activity;Driving;Yard Work    SMerchant navy officerEvolving/Moderate complexity    Rehab Potential Good    PT Frequency 2x / week    PT Duration 8 weeks    PT Treatment/Interventions ADLs/Self Care Home Management;Aquatic Therapy;DME Instruction;Gait training;Stair training;Functional mobility training;Therapeutic activities;Therapeutic exercise;Balance training;Neuromuscular re-education;Patient/family education;Passive range of motion    PT Next Visit Plan No estim due to CA and seizures. gait with cane with quad tip (indoors and outdoors), obstacles, stairs, curb, ramp,  balance training and LE strengthening. as able continue high intensity circuit and gait training (HR to stay within 102-132-can do any functional task, if becoming easy, weight him, add speed, etc)    PT Home Exercise Plan Access  Code: [email or web address]    Consulted and Agree with Plan of Care Patient             Patient will benefit from skilled therapeutic intervention in order to improve the following deficits and impairments:  Abnormal gait, Decreased activity tolerance, Decreased balance, Decreased strength, Difficulty walking  Visit Diagnosis: Unsteadiness on feet  Muscle weakness (generalized)  Foot drop, right  Other abnormalities of gait  and mobility     Problem List Patient Active Problem List   Diagnosis Date Noted   Spasticity 09/11/2021   Urinary retention 07/17/2021   S/P resection of meningioma 06/26/2021   S/P craniotomy 06/21/2021   Focal seizures ([place]) 05/06/2021   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   Meningioma ([place]) 10/29/2018   Other abnormalities of gait and mobility 09/17/2018   Acute cerebral infarction ([facility])    Thrombocytopenia ([facility])    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 ([place])    Acute blood loss anemia    Generalized OA    Gastroesophageal reflux disease    Seizure prophylaxis    Leukocytosis    Tumor 08/18/2018   Meningioma determined by biopsy of brain ([place]) 08/18/2018   MACULAR DEGENERATION 02/29/2008   ALLERGY 02/29/2008   Nonspecific (abnormal) findings on radiological and other examination of body structure 12/26/2007   NONSPECIFIC ABNORM FIND RAD&OTH EXAM LUNG FIELD 12/26/2007   GALLSTONES 10/01/2007   ELEVATION, TRANSAMINASE/LDH LEVELS 09/06/2007   HEPATITIS NOS 09/01/2007   ABDOMINAL PAIN, EPIGASTRIC 09/01/2007    Willow Ora, PTA, [facility] Outpatient Neuro [facility] [address], [place], [place] [address] [phone] 11/29/21, 4:31 PM   Name: James Olsen MRN: [id number] Date of Birth: 1946/10/10

## 2021-12-01 NOTE — Addendum Note (Signed)
Addended by: Rico Junker on: 12/01/2021 04:18 PM   Modules accepted: Orders

## 2021-12-02 ENCOUNTER — Ambulatory Visit: Payer: Medicare Other | Admitting: Physical Therapy

## 2021-12-02 ENCOUNTER — Telehealth: Payer: Self-pay | Admitting: Radiation Therapy

## 2021-12-02 ENCOUNTER — Other Ambulatory Visit: Payer: Self-pay | Admitting: Radiation Therapy

## 2021-12-02 ENCOUNTER — Other Ambulatory Visit: Payer: Self-pay

## 2021-12-02 DIAGNOSIS — M6281 Muscle weakness (generalized): Secondary | ICD-10-CM

## 2021-12-02 DIAGNOSIS — M21371 Foot drop, right foot: Secondary | ICD-10-CM

## 2021-12-02 DIAGNOSIS — R2689 Other abnormalities of gait and mobility: Secondary | ICD-10-CM

## 2021-12-02 DIAGNOSIS — R2681 Unsteadiness on feet: Secondary | ICD-10-CM | POA: Diagnosis not present

## 2021-12-02 DIAGNOSIS — D329 Benign neoplasm of meninges, unspecified: Secondary | ICD-10-CM

## 2021-12-02 NOTE — Therapy (Signed)
[facility] [address] [place], [place], [address] Phone: [phone]   Fax:  [phone]  Physical Therapy Treatment  Patient Details  Name: James Olsen MRN: [id number] Date of Birth: Jan 03, 1946 Referring Provider (PT): Meredith Staggers, MD   Encounter Date: 12/02/2021   PT End of Session - 12/02/21 2106     Visit Number 18    Number of Visits 33    Date for PT Re-Evaluation 01/30/22    Authorization Type Medicare and [place] - 10th visit PN and KX modifier required    Progress Note Due on Visit 20    PT Start Time 1443    PT Stop Time 1534    PT Time Calculation (min) 51 min    Equipment Utilized During Treatment --   R AFO   Activity Tolerance Patient tolerated treatment well    Behavior During Therapy WFL for tasks assessed/performed             Past Medical History:  Diagnosis Date   Allergy    Arthritis    Cataract    GERD (gastroesophageal reflux disease)    Glaucoma    pt denies.    History of blood transfusion     5 units after previous surgery   History of radiation therapy 12/09/18- 01/24/2019   Left sagittal brain, 11.8 Gy in 31 fractions for a total dose of 55.8 Gy   Hyperlipidemia    Macular degeneration    Seizures ([place])    Stroke [facility])    after surgery in August 2019 mostly recovered still some weakness in left arm   Wears glasses     Past Surgical History:  Procedure Laterality Date   APPLICATION OF CRANIAL NAVIGATION N/A 06/21/2021   Procedure: APPLICATION OF CRANIAL NAVIGATION;  Surgeon: [name], MD;  Location: [place];  Service: Neurosurgery;  Laterality: N/A;   BRAIN SURGERY     CHOLECYSTECTOMY  2008   lap choli   COLONOSCOPY     CRANIOPLASTY N/A 10/29/2018   Procedure: CRANIOPLASTY;  Surgeon: [name], MD;  Location: [facility];  Service: Neurosurgery;  Laterality: N/A;  CRANIOPLASTY   CRANIOTOMY N/A 08/18/2018   Procedure: CRANIOTOMY TUMOR EXCISION;  Surgeon:  [name], MD;  Location: [facility];  Service: Neurosurgery;  Laterality: N/A;  CRANIOTOMY TUMOR EXCISION   CRANIOTOMY Bilateral 06/21/2021   Procedure: Bilateral Craniotomy for tumor resection;  Surgeon: [name], MD;  Location: [facility];  Service: Neurosurgery;  Laterality: Bilateral;   DIAGNOSTIC LAPAROSCOPY     EYE SURGERY     laser surgery to relieve pressure   NAILBED REPAIR Left 05/04/2014   Procedure: LEFT INDEX NAIL ABLATION V-Y FLAP COVERAGE;  Surgeon: [name]., MD;  Location: [place];  Service: Orthopedics;  Laterality: Left;  index   PILONIDAL CYST EXCISION     x2   TONSILLECTOMY     VASECTOMY      There were no vitals filed for this visit.   Subjective Assessment - 12/02/21 1449     Subjective They have scheduled out all radiation appointments but may not need all of them; will see what the MRI says.  May have to cancel the PT appointments on same day as radiation appointments if he needs all of them.  On Sunday, LLE was pins and needles while walking; is a symptom "of the tumor on top".  No tingling right now.    Pertinent History OA, h/o blood  transfusion, h/o radiation therapy, macular degeneration, CVA, craniotomy > cranioplasty and resection of meningioma, R hemiparesis, focal seizures, urinary retention    Limitations Standing;Walking;House hold activities    Patient Stated Goals Walking, gain more strength in RLE to be able to drive in 6 months if seizure free    Currently in Pain? No/denies              [facility] Adult PT Treatment/Exercise - 12/02/21 1454       Ambulation/Gait   Ambulation/Gait Yes    Ambulation/Gait Assistance 6: Modified independent (Device/Increase time)    Ambulation/Gait Assistance Details with RW with focus on carryover of hip and knee flexion activation and increasing clearance by increasing stride length.  Pt able to return demonstrate x 2 laps with increased stance time on R, increased step and stride length  and increased R foot clearance    Ambulation Distance (Feet) 230 Feet    Assistive device Rolling walker    Gait Pattern Step-through pattern;Decreased dorsiflexion - left;Trunk flexed    Ambulation Surface Level;Indoor      Knee/Hip Exercises: Standing   Side Lunges Right;Left;2 sets;10 reps;Limitations    Side Lunges Limitations LE on side lunging towards on 4" step; performed lateral lunge to load LE on step and then focused on pushing through loaded leg to shift back over to stance LE x 10 reps each side with intermittent UE support.  Second set, after performing lunge and push off to stance LE, added in SLS on stance LE and lifting foot on block up into the air for a few seconds.  Required assistance from PT to support and fully lift R foot off block.  Performed with UE support.    Other Standing Knee Exercises // bars: standing taps to 4" step, alternating quickly x 30 seconds and then 45 seconds with improved clearance of R foot; 2 sets.  Then performed one foot on block performing blocked single leg hip flexion x 45 seconds for LLE x 2 sets; for RLE focused on slow, smooth movement x 20 reps x 4 sets.               PT Education - 12/02/21 2105     Education Details will hold on cancelling any PT visits until after he gets results of MRI and plan for radiation    Person(s) Educated Patient    Methods Explanation    Comprehension Verbalized understanding              PT Short Term Goals - 12/01/21 1609       PT SHORT TERM GOAL #1   Title = LTG set at 4 weeks (12/31/21) and then can be set again at 8 weeks (01/30/22)    Target Date 01/30/22               PT Long Term Goals - 12/01/21 1611       PT LONG TERM GOAL #1   Title Pt will demonstrate independence with final HEP    Time 4    Period Weeks    Status Revised    Target Date 12/31/21      PT LONG TERM GOAL #2   Title Pt will increase gait velocity with AFO and cane to >/= 2.62 ft/sec without LOB     Baseline 11/21/21 with RW and AFO 2.97 ft/sec    Time 4    Period Weeks    Status Revised    Target Date 12/31/21  PT LONG TERM GOAL #3   Title Pt will increase score on Mini-Best test by 4 points to indicate decreased falls risk    Baseline 11/21/21 16/30    Time 4    Period Weeks    Status Revised    Target Date 12/31/21      PT LONG TERM GOAL #4   Title Pt will ambulate x 250' outside over uneven pavement, negotiate ramp, curb and 12 stairs with AFO and CANE, MOD I.    Time 4    Period Weeks    Status Revised    Target Date 12/31/21                   Plan - 12/02/21 2108     Clinical Impression Statement Continued to utilize blocked, high intensity LE strength and balance training as well as functional WB/weight shift training to promote NMR and improved gait sequencing and safety.  Pt continues to demonstrate good carryover to gait within session but decreased carryover between sessions.  Will continue to address and progress towards LTG.    Comorbidities OA, h/o blood transfusion, h/o radiation therapy, macular degeneration, CVA, craniotomy > cranioplasty and resection of meningioma, R hemiparesis, focal seizures, urinary retention    Rehab Potential Good    PT Frequency 2x / week    PT Duration 8 weeks    PT Treatment/Interventions ADLs/Self Care Home Management;Aquatic Therapy;DME Instruction;Gait training;Stair training;Functional mobility training;Therapeutic activities;Therapeutic exercise;Balance training;Neuromuscular re-education;Patient/family education;Passive range of motion    PT Next Visit Plan No estim due to CA and seizures. gait with cane with quad tip (indoors and outdoors), obstacles, stairs, curb, ramp,  balance training and LE strengthening. as able continue high intensity circuit and gait training (HR to stay within 102-132-can do any functional task, if becoming easy, weight him, add speed, etc).  NMR for RLE    PT Home Exercise Plan Access Code:  [email or web address]    Consulted and Agree with Plan of Care Patient             Patient will benefit from skilled therapeutic intervention in order to improve the following deficits and impairments:  Abnormal gait, Decreased activity tolerance, Decreased balance, Decreased strength, Difficulty walking  Visit Diagnosis: Unsteadiness on feet  Muscle weakness (generalized)  Foot drop, right  Other abnormalities of gait and mobility     Problem List Patient Active Problem List   Diagnosis Date Noted   Spasticity 09/11/2021   Urinary retention 07/17/2021   S/P resection of meningioma 06/26/2021   S/P craniotomy 06/21/2021   Focal seizures ([place]) 05/06/2021   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   Meningioma ([place]) 10/29/2018   Other abnormalities of gait and mobility 09/17/2018   Acute cerebral infarction ([facility])    Thrombocytopenia ([facility])    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 ([place])    Acute blood loss anemia    Generalized OA    Gastroesophageal reflux disease    Seizure prophylaxis    Leukocytosis    Tumor 08/18/2018   Meningioma determined by biopsy of brain ([place]) 08/18/2018   MACULAR DEGENERATION 02/29/2008   ALLERGY 02/29/2008   Nonspecific (abnormal) findings on radiological and other examination of body structure 12/26/2007   NONSPECIFIC ABNORM FIND RAD&OTH EXAM LUNG FIELD 12/26/2007   GALLSTONES 10/01/2007   ELEVATION, TRANSAMINASE/LDH LEVELS 09/06/2007   HEPATITIS NOS 09/01/2007   ABDOMINAL PAIN, EPIGASTRIC 09/01/2007    Rico Junker, PT, DPT 12/02/21  9:14 PM    [facility] [address] [place], [place], [address] Phone: [phone]   Fax:  [phone]  Name: BENJIE RICKETSON MRN: [id number] Date of Birth: [birth year]

## 2021-12-02 NOTE — Telephone Encounter (Signed)
Spoke with patient about upcoming [facility] 12/03/21. Informed him of added lab appointment at 1:00, what to wear for the [facility], when to take his Ativan and reminded him to have a driver.   Pt understood and has my contact information in case any additional questions or concerns.   Mont Dutton R.T.(R)(T) Radiation Special Procedures Navigator

## 2021-12-03 ENCOUNTER — Ambulatory Visit
Admission: RE | Admit: 2021-12-03 | Discharge: 2021-12-03 | Disposition: A | Discharge status: Home / Self Care | Payer: Medicare Other | Source: Ambulatory Visit | Attending: Radiation Oncology | Admitting: Radiation Oncology

## 2021-12-03 VITALS — BP 121/72 | HR 68 | Temp 97.8°F | Resp 18 | Ht 69.0 in | Wt 187.6 lb

## 2021-12-03 DIAGNOSIS — D420 Neoplasm of uncertain behavior of cerebral meninges: Secondary | ICD-10-CM | POA: Insufficient documentation

## 2021-12-03 DIAGNOSIS — D329 Benign neoplasm of meninges, unspecified: Secondary | ICD-10-CM

## 2021-12-03 LAB — BUN & CREATININE (CHCC)
BUN: 17 mg/dL (ref 8–23)
Creatinine: 0.84 mg/dL (ref 0.61–1.24)
GFR, Estimated: >60 mL/min (ref >60)

## 2021-12-03 MED ORDER — SODIUM CHLORIDE 0.9% FLUSH
10.0000 mL | Freq: Once | INTRAVENOUS | Status: AC
  Administered 2021-12-03: 10 mL via INTRAVENOUS

## 2021-12-03 NOTE — Progress Notes (Signed)
Has armband been applied?  Yes.    Does patient have an allergy to IV contrast dye?: No.   Has patient ever received premedication for IV contrast dye?: No.   Does patient take metformin?: No.  Date of lab work: December 03, 2021 BUN: 17 CR: 0.84 eGFR: >60  IV site: antecubital left, condition patent and no redness  Has IV site been added to flowsheet?  Yes.    BP 121/72 (BP Location: Left Arm, Patient Position: Sitting, Cuff Size: Normal)    Pulse 68    Temp 97.8 F (36.6 C)    Resp 18    Ht _0  (1.753 m)    Wt 187 lb 9.6 oz (85.1 kg)    SpO2 100%    BMI 27.70 kg/m

## 2021-12-04 ENCOUNTER — Ambulatory Visit
Admission: RE | Admit: 2021-12-04 | Discharge: 2021-12-04 | Disposition: A | Discharge status: Home / Self Care | Payer: Medicare Other | Source: Ambulatory Visit | Attending: Radiation Oncology | Admitting: Radiation Oncology

## 2021-12-04 ENCOUNTER — Other Ambulatory Visit: Payer: Self-pay

## 2021-12-04 DIAGNOSIS — D320 Benign neoplasm of cerebral meninges: Secondary | ICD-10-CM

## 2021-12-04 MED ORDER — GADOBENATE DIMEGLUMINE 529 MG/ML IV SOLN
17.0000 mL | Freq: Once | INTRAVENOUS | Status: AC | PRN
  Administered 2021-12-04: 13:00:00 17 mL via INTRAVENOUS

## 2021-12-05 ENCOUNTER — Encounter: Payer: Self-pay | Admitting: Physical Therapy

## 2021-12-05 ENCOUNTER — Ambulatory Visit: Payer: Medicare Other | Admitting: Physical Therapy

## 2021-12-05 DIAGNOSIS — R2681 Unsteadiness on feet: Secondary | ICD-10-CM | POA: Diagnosis not present

## 2021-12-05 DIAGNOSIS — M6281 Muscle weakness (generalized): Secondary | ICD-10-CM

## 2021-12-05 DIAGNOSIS — R2689 Other abnormalities of gait and mobility: Secondary | ICD-10-CM

## 2021-12-05 DIAGNOSIS — M21371 Foot drop, right foot: Secondary | ICD-10-CM

## 2021-12-06 ENCOUNTER — Ambulatory Visit: Payer: Medicare Other | Admitting: Radiation Oncology

## 2021-12-06 ENCOUNTER — Ambulatory Visit: Payer: Medicare Other

## 2021-12-06 ENCOUNTER — Other Ambulatory Visit: Payer: Self-pay | Admitting: Radiation Therapy

## 2021-12-06 NOTE — Therapy (Addendum)
[facility] [address] [place], [place], [address] Phone: [phone]   Fax:  [phone]  Physical Therapy Treatment  Patient Details  Name: James Olsen MRN: [id number] Date of Birth: 06/16/1946 Referring Provider (PT): Meredith Staggers, MD   Encounter Date: 12/05/2021   PT End of Session - 12/05/21 1452     Visit Number 19    Number of Visits 33    Date for PT Re-Evaluation 01/30/22    Authorization Type Medicare and [place] - 10th visit PN and KX modifier required    Progress Note Due on Visit 20    PT Start Time 1449    PT Stop Time 1530    PT Time Calculation (min) 41 min    Equipment Utilized During Treatment --   R AFO   Activity Tolerance Patient tolerated treatment well    Behavior During Therapy WFL for tasks assessed/performed             Past Medical History:  Diagnosis Date   Allergy    Arthritis    Cataract    GERD (gastroesophageal reflux disease)    Glaucoma    pt denies.    History of blood transfusion     5 units after previous surgery   History of radiation therapy 12/09/18- 01/24/2019   Left sagittal brain, 11.8 Gy in 31 fractions for a total dose of 55.8 Gy   Hyperlipidemia    Macular degeneration    Seizures ([place])    Stroke [facility])    after surgery in August 2019 mostly recovered still some weakness in left arm   Wears glasses     Past Surgical History:  Procedure Laterality Date   APPLICATION OF CRANIAL NAVIGATION N/A 06/21/2021   Procedure: APPLICATION OF CRANIAL NAVIGATION;  Surgeon: [name], MD;  Location: [facility];  Service: Neurosurgery;  Laterality: N/A;   BRAIN SURGERY     CHOLECYSTECTOMY  2008   lap choli   COLONOSCOPY     CRANIOPLASTY N/A 10/29/2018   Procedure: CRANIOPLASTY;  Surgeon: [name], MD;  Location: [facility];  Service: Neurosurgery;  Laterality: N/A;  CRANIOPLASTY   CRANIOTOMY N/A 08/18/2018   Procedure: CRANIOTOMY TUMOR EXCISION;  Surgeon:  [name], MD;  Location: [facility];  Service: Neurosurgery;  Laterality: N/A;  CRANIOTOMY TUMOR EXCISION   CRANIOTOMY Bilateral 06/21/2021   Procedure: Bilateral Craniotomy for tumor resection;  Surgeon: [name], MD;  Location: [facility];  Service: Neurosurgery;  Laterality: Bilateral;   DIAGNOSTIC LAPAROSCOPY     EYE SURGERY     laser surgery to relieve pressure   NAILBED REPAIR Left 05/04/2014   Procedure: LEFT INDEX NAIL ABLATION V-Y FLAP COVERAGE;  Surgeon: [name]., MD;  Location: [employer];  Service: Orthopedics;  Laterality: Left;  index   PILONIDAL CYST EXCISION     x2   TONSILLECTOMY     VASECTOMY      There were no vitals filed for this visit.   Subjective Assessment - 12/05/21 1452     Subjective No new complaints. No falls or pain to report. Had the IV sim and scans done. Has 3 dates of radiation that will conflict with PT so needs to cancell those appointments.    Pertinent History OA, h/o blood transfusion, h/o radiation therapy, macular degeneration, CVA, craniotomy > cranioplasty and resection of meningioma, R hemiparesis, focal seizures, urinary retention    Limitations Standing;Walking;House hold activities    Patient  Stated Goals Walking, gain more strength in RLE to be able to drive in 6 months if seizure free    Currently in Pain? No/denies                               [facility] Adult PT Treatment/Exercise - 12/05/21 1454       Transfers   Transfers Sit to Stand;Stand to Sit    Sit to Stand 5: Supervision;4: Min guard    Stand to Sit 5: Supervision      Ambulation/Gait   Ambulation/Gait Yes    Ambulation/Gait Assistance 6: Modified independent (Device/Increase time)    Ambulation/Gait Assistance Details with RW with focus on carryover of hip and knee flexion activation and increasing clearance by increasing stride length. reminder cues needed at times with gait.    Ambulation Distance (Feet) 230 Feet    Assistive  device Rolling walker    Gait Pattern Step-through pattern;Decreased dorsiflexion - left;Trunk flexed    Ambulation Surface Level;Indoor      Neuro Re-ed    Neuro Re-ed Details  for baanace/muscle re-ed: standing at bottom of steps with bil UE support: left stance with right foot taps up/down bottom 2 steps for 10 reps with emphasis on hip/knee flexion, then right stance- working on left foot taps up/down steps with emphasis on right knee control;on red mat next to mat table- tall kneeling no resistance for mini squats in central position for 10 reps, progressing to use of green band resistance for 10 reps in central position, then 10 laps toward each lateral direction with single UE support;  single UE on mat/other on PTA knee- tall kneel<>half kneel position changes for 5 reps each side, alternating sides with assistance needed for right foot with return to tall kneeling due to brace being off.                       PT Short Term Goals - 12/01/21 1609       PT SHORT TERM GOAL #1   Title = LTG set at 4 weeks (12/31/21) and then can be set again at 8 weeks (01/30/22)    Target Date 01/30/22               PT Long Term Goals - 12/01/21 1611       PT LONG TERM GOAL #1   Title Pt will demonstrate independence with final HEP    Time 4    Period Weeks    Status Revised    Target Date 12/31/21      PT LONG TERM GOAL #2   Title Pt will increase gait velocity with AFO and cane to >/= 2.62 ft/sec without LOB    Baseline 11/21/21 with RW and AFO 2.97 ft/sec    Time 4    Period Weeks    Status Revised    Target Date 12/31/21      PT LONG TERM GOAL #3   Title Pt will increase score on Mini-Best test by 4 points to indicate decreased falls risk    Baseline 11/21/21 16/30    Time 4    Period Weeks    Status Revised    Target Date 12/31/21      PT LONG TERM GOAL #4   Title Pt will ambulate x 250' outside over uneven pavement, negotiate ramp, curb and 12 stairs with AFO and  CANE, MOD I.  Time 4    Period Weeks    Status Revised    Target Date 12/31/21                   Plan - 12/05/21 1452     Clinical Impression Statement Today's skilled session continued to work on gait mechanics and strengthening. No issues noted or reported in sessoin. The pt is progressing toward goals and should benefit from continued PT to progress toward unmet goals.    Comorbidities OA, h/o blood transfusion, h/o radiation therapy, macular degeneration, CVA, craniotomy > cranioplasty and resection of meningioma, R hemiparesis, focal seizures, urinary retention    Rehab Potential Good    PT Frequency 2x / week    PT Duration 8 weeks    PT Treatment/Interventions ADLs/Self Care Home Management;Aquatic Therapy;DME Instruction;Gait training;Stair training;Functional mobility training;Therapeutic activities;Therapeutic exercise;Balance training;Neuromuscular re-education;Patient/family education;Passive range of motion    PT Next Visit Plan No estim due to CA and seizures. gait with cane with quad tip (indoors and outdoors), obstacles, stairs, curb, ramp,  balance training and LE strengthening. as able continue high intensity circuit and gait training (HR to stay within 102-132-can do any functional task, if becoming easy, weight him, add speed, etc).  NMR for RLE    PT Home Exercise Plan Access Code: [email or web address]    Consulted and Agree with Plan of Care Patient             Patient will benefit from skilled therapeutic intervention in order to improve the following deficits and impairments:  Abnormal gait, Decreased activity tolerance, Decreased balance, Decreased strength, Difficulty walking  Visit Diagnosis: Unsteadiness on feet  Muscle weakness (generalized)  Foot drop, right  Other abnormalities of gait and mobility     Problem List Patient Active Problem List   Diagnosis Date Noted   Spasticity 09/11/2021   Urinary retention 07/17/2021   S/P resection of  meningioma 06/26/2021   S/P craniotomy 06/21/2021   Focal seizures ([place]) 05/06/2021   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   Meningioma ([place]) 10/29/2018   Other abnormalities of gait and mobility 09/17/2018   Acute cerebral infarction ([facility])    Thrombocytopenia ([facility])    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 ([place])    Acute blood loss anemia    Generalized OA    Gastroesophageal reflux disease    Seizure prophylaxis    Leukocytosis    Tumor 08/18/2018   Meningioma determined by biopsy of brain ([place]) 08/18/2018   MACULAR DEGENERATION 02/29/2008   ALLERGY 02/29/2008   Nonspecific (abnormal) findings on radiological and other examination of body structure 12/26/2007   NONSPECIFIC ABNORM FIND RAD&OTH EXAM LUNG FIELD 12/26/2007   GALLSTONES 10/01/2007   ELEVATION, TRANSAMINASE/LDH LEVELS 09/06/2007   HEPATITIS NOS 09/01/2007   ABDOMINAL PAIN, EPIGASTRIC 09/01/2007    Willow Ora, PTA, [facility] Outpatient Neuro [facility] [address], [place], [place] [address] [phone] 12/06/21, 11:18 AM   Name: James Olsen MRN: [id number] Date of Birth: [birth year]

## 2021-12-09 ENCOUNTER — Other Ambulatory Visit: Payer: Self-pay

## 2021-12-09 ENCOUNTER — Ambulatory Visit: Payer: Medicare Other | Admitting: Physical Therapy

## 2021-12-09 DIAGNOSIS — M21371 Foot drop, right foot: Secondary | ICD-10-CM

## 2021-12-09 DIAGNOSIS — R2681 Unsteadiness on feet: Secondary | ICD-10-CM | POA: Diagnosis not present

## 2021-12-09 DIAGNOSIS — R2689 Other abnormalities of gait and mobility: Secondary | ICD-10-CM

## 2021-12-09 DIAGNOSIS — M6281 Muscle weakness (generalized): Secondary | ICD-10-CM

## 2021-12-09 NOTE — Therapy (Signed)
[facility] [address] [place], [place], [address] Phone: [phone]   Fax:  [phone]  Physical Therapy Treatment and 10th Visit Progress Note  Patient Details  Name: James Olsen MRN: [id number] Date of Birth: Nov 17, 1946 Referring Provider (PT): Meredith Staggers, MD   Encounter Date: 12/09/2021  Progress Note Reporting Period 10/29/2021 to 12/09/2021  See note below for Objective Data and Assessment of Progress/Goals.     PT End of Session - 12/09/21 2253     Visit Number 20    Number of Visits 33    Date for PT Re-Evaluation 01/30/22    Authorization Type Medicare and [place] - 10th visit PN and KX modifier required    Progress Note Due on Visit 30    PT Start Time 1448    PT Stop Time 1530    PT Time Calculation (min) 42 min    Equipment Utilized During Treatment --   R AFO   Activity Tolerance Patient tolerated treatment well    Behavior During Therapy WFL for tasks assessed/performed             Past Medical History:  Diagnosis Date   Allergy    Arthritis    Cataract    GERD (gastroesophageal reflux disease)    Glaucoma    pt denies.    History of blood transfusion     5 units after previous surgery   History of radiation therapy 12/09/18- 01/24/2019   Left sagittal brain, 11.8 Gy in 31 fractions for a total dose of 55.8 Gy   Hyperlipidemia    Macular degeneration    Seizures ([place])    Stroke [facility])    after surgery in August 2019 mostly recovered still some weakness in left arm   Wears glasses     Past Surgical History:  Procedure Laterality Date   APPLICATION OF CRANIAL NAVIGATION N/A 06/21/2021   Procedure: APPLICATION OF CRANIAL NAVIGATION;  Surgeon: [name], MD;  Location: [facility];  Service: Neurosurgery;  Laterality: N/A;   BRAIN SURGERY     CHOLECYSTECTOMY  2008   lap choli   COLONOSCOPY     CRANIOPLASTY N/A 10/29/2018   Procedure: CRANIOPLASTY;  Surgeon: [name], MD;  Location: [facility];  Service: Neurosurgery;  Laterality: N/A;  CRANIOPLASTY   CRANIOTOMY N/A 08/18/2018   Procedure: CRANIOTOMY TUMOR EXCISION;  Surgeon: [name], MD;  Location: [facility];  Service: Neurosurgery;  Laterality: N/A;  CRANIOTOMY TUMOR EXCISION   CRANIOTOMY Bilateral 06/21/2021   Procedure: Bilateral Craniotomy for tumor resection;  Surgeon: [name], MD;  Location: [facility];  Service: Neurosurgery;  Laterality: Bilateral;   DIAGNOSTIC LAPAROSCOPY     EYE SURGERY     laser surgery to relieve pressure   NAILBED REPAIR Left 05/04/2014   Procedure: LEFT INDEX NAIL ABLATION V-Y FLAP COVERAGE;  Surgeon: [name]., MD;  Location: [facility];  Service: Orthopedics;  Laterality: Left;  index   PILONIDAL CYST EXCISION     x2   TONSILLECTOMY     VASECTOMY      There were no vitals filed for this visit.   Subjective Assessment - 12/09/21 1448     Subjective Starts radiation on Friday.  Going to try and lay low for Christmas due to starting radiation.  Sees Dr. Naaman Plummer tomorrow.    Pertinent History OA, h/o blood transfusion, h/o radiation therapy, macular degeneration, CVA, craniotomy > cranioplasty and resection of meningioma, R  hemiparesis, focal seizures, urinary retention    Limitations Standing;Walking;House hold activities    Patient Stated Goals Walking, gain more strength in RLE to be able to drive in 6 months if seizure free    Currently in Pain? No/denies              [facility] Adult PT Treatment/Exercise - 12/09/21 1504       Ambulation/Gait   Ambulation/Gait Yes    Ambulation/Gait Assistance 4: Min assist    Ambulation/Gait Assistance Details with cane with quad tip with obstacle course: curb, ramp, across compliant surface, stepping over obstacles, weaving around obstacles.  When stepping over obstacles pt required cues to initiate stepping over with RLE first.    Ambulation Distance (Feet) 200 Feet    Assistive device Straight cane     Gait Pattern Step-through pattern;Decreased step length - right;Decreased arm swing - right;Decreased stride length;Decreased hip/knee flexion - right;Decreased dorsiflexion - right;Poor foot clearance - right    Ambulation Surface Level;Indoor    Stairs Yes    Stairs Assistance 4: Min assist    Stairs Assistance Details (indicate cue type and reason) with cane and rail in each hand, step to sequence, leading with RLE to ascend and descend to ensure R foot clearance.  Cues to shift weight forwards to R stance LE when descending    Stair Management Technique One rail Right;One rail Left;Step to pattern;Forwards;With cane    Number of Stairs 4    Height of Stairs 6    Ramp 4: Min assist    Ramp Details (indicate cue type and reason) with cane; decreased R foot clearance when ascending and descending    Curb 3: Mod assist    Curb Details (indicate cue type and reason) with cane; required UE support in RUE for safety and verbal cues for sequencing and weight shifting      Knee/Hip Exercises: Aerobic   Stepper Scifit stepper x 8 mins with BUEs/LEs at level 2 resistance at moderate intensity for overall strengthening and to improve flexibility in RLE prior to gait training.                 Balance Exercises - 12/09/21 1524       Balance Exercises: Standing   SLS with Vectors Solid surface;Upper extremity assist 1;Upper extremity assist 2;5 reps;Limitations    SLS with Vectors Limitations SLS On RLE, LLE taps to cone forwards and across midline progressively decreasing UE support.                PT Education - 12/09/21 2253     Education Details reviewed visits in new year; unable to move any to other PT at this time due to time of appointments    Person(s) Educated Patient    Methods Explanation    Comprehension Verbalized understanding              PT Short Term Goals - 12/01/21 1609       PT SHORT TERM GOAL #1   Title = LTG set at 4 weeks (12/31/21) and then can  be set again at 8 weeks (01/30/22)    Target Date 01/30/22               PT Long Term Goals - 12/01/21 1611       PT LONG TERM GOAL #1   Title Pt will demonstrate independence with final HEP    Time 4    Period Weeks    Status Revised  Target Date 12/31/21      PT LONG TERM GOAL #2   Title Pt will increase gait velocity with AFO and cane to >/= 2.62 ft/sec without LOB    Baseline 11/21/21 with RW and AFO 2.97 ft/sec    Time 4    Period Weeks    Status Revised    Target Date 12/31/21      PT LONG TERM GOAL #3   Title Pt will increase score on Mini-Best test by 4 points to indicate decreased falls risk    Baseline 11/21/21 16/30    Time 4    Period Weeks    Status Revised    Target Date 12/31/21      PT LONG TERM GOAL #4   Title Pt will ambulate x 250' outside over uneven pavement, negotiate ramp, curb and 12 stairs with AFO and CANE, MOD I.    Time 4    Period Weeks    Status Revised    Target Date 12/31/21                   Plan - 12/09/21 2254     Clinical Impression Statement Performed re-assessment of gait with cane over variety of surfaces, negotiating ramp, curb, stairs and obstacles.  Pt demonstrated improved safety and decreased R toe drag on level surfaces but continues to require min-mod A to negotiate uneven surfaces and to prevent a fall due to R foot drop.  Continued to focus on R stance phase balance and control with decreased UE support.  Will continue to address and progress towards LTG.    Comorbidities OA, h/o blood transfusion, h/o radiation therapy, macular degeneration, CVA, craniotomy > cranioplasty and resection of meningioma, R hemiparesis, focal seizures, urinary retention    Rehab Potential Good    PT Frequency 2x / week    PT Duration 8 weeks    PT Treatment/Interventions ADLs/Self Care Home Management;Aquatic Therapy;DME Instruction;Gait training;Stair training;Functional mobility training;Therapeutic activities;Therapeutic  exercise;Balance training;Neuromuscular re-education;Patient/family education;Passive range of motion    PT Next Visit Plan No estim due to CA and seizures. Continue to work on safe gait with cane with quad tip (indoors and outdoors), obstacles, stairs, curb, ramp,  balance training and LE strengthening. as able continue high intensity circuit and gait training (HR to stay within 102-132-can do any functional task, if becoming easy, weight him, add speed, etc).  NMR for RLE.  Not able to switch any visits to [facility] due to time of appointments.    PT Home Exercise Plan Access Code: [email or web address]    Consulted and Agree with Plan of Care Patient             Patient will benefit from skilled therapeutic intervention in order to improve the following deficits and impairments:  Abnormal gait, Decreased activity tolerance, Decreased balance, Decreased strength, Difficulty walking  Visit Diagnosis: Unsteadiness on feet  Muscle weakness (generalized)  Foot drop, right  Other abnormalities of gait and mobility     Problem List Patient Active Problem List   Diagnosis Date Noted   Spasticity 09/11/2021   Urinary retention 07/17/2021   S/P resection of meningioma 06/26/2021   S/P craniotomy 06/21/2021   Focal seizures ([place]) 05/06/2021   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   Meningioma ([place]) 10/29/2018   Other abnormalities of gait and mobility 09/17/2018   Acute cerebral infarction ([facility])    Thrombocytopenia ([facility])    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 (  [place])    Acute blood loss anemia    Generalized OA    Gastroesophageal reflux disease    Seizure prophylaxis    Leukocytosis    Tumor 08/18/2018   Meningioma determined by biopsy of brain ([place]) 08/18/2018   MACULAR DEGENERATION 02/29/2008   ALLERGY 02/29/2008   Nonspecific (abnormal) findings on radiological and other examination of body structure 12/26/2007   NONSPECIFIC ABNORM FIND RAD&OTH  EXAM LUNG FIELD 12/26/2007   GALLSTONES 10/01/2007   ELEVATION, TRANSAMINASE/LDH LEVELS 09/06/2007   HEPATITIS NOS 09/01/2007   ABDOMINAL PAIN, EPIGASTRIC 09/01/2007   Rico Junker, PT, DPT 12/09/21    11:03 PM    [facility] [address] [place], [place], [address] Phone: [phone]   Fax:  [phone]  Name: James Olsen MRN: [id number] Date of Birth: 21-Mar-1946

## 2021-12-11 ENCOUNTER — Encounter: Payer: Medicare Other | Attending: Physical Medicine & Rehabilitation | Admitting: Physical Medicine & Rehabilitation

## 2021-12-11 ENCOUNTER — Other Ambulatory Visit: Payer: Self-pay

## 2021-12-11 ENCOUNTER — Encounter: Payer: Self-pay | Admitting: Physical Medicine & Rehabilitation

## 2021-12-11 ENCOUNTER — Other Ambulatory Visit: Payer: Self-pay | Admitting: *Deleted

## 2021-12-11 VITALS — BP 121/77 | HR 70 | Temp 98.4°F | Ht 69.0 in | Wt 187.4 lb

## 2021-12-11 DIAGNOSIS — D329 Benign neoplasm of meninges, unspecified: Secondary | ICD-10-CM | POA: Diagnosis present

## 2021-12-11 DIAGNOSIS — D420 Neoplasm of uncertain behavior of cerebral meninges: Secondary | ICD-10-CM

## 2021-12-11 DIAGNOSIS — R252 Cramp and spasm: Secondary | ICD-10-CM | POA: Diagnosis present

## 2021-12-11 DIAGNOSIS — R339 Retention of urine, unspecified: Secondary | ICD-10-CM

## 2021-12-11 MED ORDER — BACLOFEN 10 MG PO TABS: ORAL_TABLET | ORAL | 2 refills | Status: DC

## 2021-12-11 MED ORDER — BACLOFEN 10 MG PO TABS: 10.0000 mg | ORAL_TABLET | Freq: Four times a day (QID) | ORAL | 2 refills | Status: DC

## 2021-12-11 NOTE — Progress Notes (Signed)
Subjective:    Patient ID: James Olsen, male    DOB: 1946/04/22, 75 y.o.   MRN: [id number]  HPI  Mr. 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  Unfortunately since I last saw him he has had some progression of the tumor.  He was seen by Dr. Mickeal Skinner last month who referred him back to neurosurgery for consideration of radiosurgery as well as to radiation oncology.  I last saw the patient in September at which time the patient was demonstrating continued to progress. Even since the discovery of the new growth he really hasn't had much in the way of symptoms other than some scalp discomfort.   He is emptying his bladder well. He is really having urinary frequency at this point. Urine flow is good.   He is still having cramping of his right leg most often in the morning. He takes baclofen tid currently 10mg     Pain Inventory Average Pain 0 Pain Right Now 0 My pain is  no pain  LOCATION OF PAIN  Weakness of right leg & upper right arm  BOWEL Number of stools per week: 7 or more   BLADDER Normal   Mobility walk with assistance use a cane use a walker how many minutes can you walk? 30 minutes ability to climb steps?  yes do you drive?  no Do you have any goals in this area?  yes  Function retired I need assistance with the following:  meal prep, household duties, and shopping Do you have any goals in this area?  yes  Neuro/Psych weakness numbness tremor trouble walking spasms depression anxiety  Prior Studies Any changes since last visit?  yes MRI of brain  Physicians involved in your care Any changes since last visit?  yes, [facility]   Family History  Problem Relation Age of Onset   Diabetes Mother    Skin cancer Brother    Congenital heart disease Brother    Social History   Socioeconomic History   Marital status: Married    Spouse name: Not on file   Number of children: Not on file   Years of education: Not on file    Highest education level: Not on file  Occupational History   Not on file  Tobacco Use   Smoking status: Never   Smokeless tobacco: Never  Vaping Use   Vaping Use: Never used  Substance and Sexual Activity   Alcohol use: Yes    Comment: occasionally   Drug use: No   Sexual activity: Not Currently  Other Topics Concern   Not on file  Social History Narrative   Not on file   Social Determinants of Health   Financial Resource Strain: Not on file  Food Insecurity: Not on file  Transportation Needs: Not on file  Physical Activity: Not on file  Stress: Not on file  Social Connections: Not on file   Past Surgical History:  Procedure Laterality Date   APPLICATION OF CRANIAL NAVIGATION N/A 06/21/2021   Procedure: [place];  Surgeon: [name], MD;  Location: [place];  Service: Neurosurgery;  Laterality: N/A;   BRAIN SURGERY     CHOLECYSTECTOMY  2008   lap choli   COLONOSCOPY     CRANIOPLASTY N/A 10/29/2018   Procedure: CRANIOPLASTY;  Surgeon: [name], MD;  Location: [place];  Service: Neurosurgery;  Laterality: N/A;  CRANIOPLASTY   CRANIOTOMY N/A 08/18/2018   Procedure: CRANIOTOMY TUMOR EXCISION;  Surgeon: [name],  MD;  Location: [place];  Service: Neurosurgery;  Laterality: N/A;  CRANIOTOMY TUMOR EXCISION   CRANIOTOMY Bilateral 06/21/2021   Procedure: Bilateral Craniotomy for tumor resection;  Surgeon: [name], MD;  Location: [place];  Service: Neurosurgery;  Laterality: Bilateral;   DIAGNOSTIC LAPAROSCOPY     EYE SURGERY     laser surgery to relieve pressure   NAILBED REPAIR Left 05/04/2014   Procedure: LEFT INDEX NAIL ABLATION V-Y FLAP COVERAGE;  Surgeon: [name]., MD;  Location: [place] [facility];  Service: Orthopedics;  Laterality: Left;  index   PILONIDAL CYST EXCISION     x2   TONSILLECTOMY     VASECTOMY     Past Medical History:  Diagnosis Date   Allergy    Arthritis    Cataract    GERD (gastroesophageal  reflux disease)    Glaucoma    pt denies.    History of blood transfusion     5 units after previous surgery   History of radiation therapy 12/09/18- 01/24/2019   Left sagittal brain, 11.8 Gy in 31 fractions for a total dose of 55.8 Gy   Hyperlipidemia    Macular degeneration    Seizures ([place])    Stroke [facility])    after surgery in August 2019 mostly recovered still some weakness in left arm   Wears glasses    BP 121/77    Pulse 70    Temp 98.4 F (36.9 C)    Ht 5\' 9"  (1.753 m)    Wt 187 lb 6.4 oz (85 kg)    SpO2 98%    BMI 27.67 kg/m   Opioid Risk Score:   Fall Risk Score:  `1  Depression screen PHQ 2/9  Depression screen [facility] 2/9 12/11/2021 09/11/2021 09/13/2018  Decreased Interest 0 0 0  Down, Depressed, Hopeless 0 0 0  PHQ - 2 Score 0 0 0  Altered sleeping - 0 1  Tired, decreased energy - 0 0  Change in appetite - 0 0  Feeling bad or failure about yourself  - 0 0  Trouble concentrating - 0 0  Moving slowly or fidgety/restless - 0 0  Suicidal thoughts - 0 0  PHQ-9 Score - 0 1  Difficult doing work/chores - - Not difficult at all  Some recent data might be hidden    Review of Systems  Musculoskeletal:  Positive for gait problem.       Spasms  Neurological:  Positive for tremors, weakness and numbness.  Psychiatric/Behavioral:         Anxiety, depression  All other systems reviewed and are negative.     Objective:   Physical Exam General: No acute distress HEENT: NCAT, EOMI, oral membranes moist Cards: reg rate  Chest: normal effort Abdomen: Soft, NT, ND Skin: dry, intact Extremities: no edema Psych: pleasant and appropriate  Skin: intact Neuro: Patient is alert and oriented x3.  Cranial nerve exam is unremarkable.  Upper extremity strength is 4 out of 5 and grossly equal left to right.  Right lower extremity is 2 out of 5 with hip flexion and knee extension.  Hamstrings are 2 out of 5.  He is 1-5 plantarflexion and absent ankle dorsiflexion.  Left lower extremity  is 3 out of 5 proximally prep 3+ out of 5, 3-4 out of 5 at the ankle.  Sensory exam slightly diminished on right. . Skips right foot slightly during gait. Has kick plate which helps.   He is hyperreflexic 3+  on the right 2+ on the left lower extremities. Musculoskeletal: Full ROM, No pain with AROM or PROM in the neck, trunk, or extremities. Posture appropriate             Assessment & Plan:  Functional deficits secondary to atypical meningioma and subsequent craniotomy with associated spastic weakness  Has xrt pending for new mass, re-growth -continue HEP as directed. He has made nice progress despite the onc issues.  Pain management: Appears well controlled at present Neurogenic bowel and bladder; patient seems to making progress here.  -reduce urecholine and taper off.   Spasticity: -increased to qid so that he can take 1-2 at night to help am spasms further 5.  Seizures.  now off keppra   Thirty minutes of face to face patient care time were spent during this visit. All questions were encouraged and answered. Follow up with me in 4 mos.

## 2021-12-11 NOTE — Patient Instructions (Addendum)
PLEASE FEEL FREE TO CALL OUR OFFICE WITH ANY PROBLEMS OR QUESTIONS ([phone])  URECHOLINE: REDUCE TO 25MG  2 X DAILY FOR A WEEK.   IF ALL IS WELL, YOU CAN GO TO ONCE DAILY FOR 4 DAYS, THEN STOP ENTIRELY.    YOU MAY TAKE AN EXTRA 1/2 TO 1 TAB OF BACLOFEN AT NIGHT TO HELP WITH AM SPASMS. Marland Kitchen

## 2021-12-12 ENCOUNTER — Ambulatory Visit: Payer: Medicare Other | Admitting: Physical Therapy

## 2021-12-12 DIAGNOSIS — R2689 Other abnormalities of gait and mobility: Secondary | ICD-10-CM

## 2021-12-12 DIAGNOSIS — M21371 Foot drop, right foot: Secondary | ICD-10-CM

## 2021-12-12 DIAGNOSIS — R2681 Unsteadiness on feet: Secondary | ICD-10-CM

## 2021-12-12 DIAGNOSIS — M6281 Muscle weakness (generalized): Secondary | ICD-10-CM

## 2021-12-12 NOTE — Therapy (Signed)
[facility] [address] [place], [place], [address] Phone: [phone]   Fax:  [phone]  Physical Therapy Treatment  Patient Details  Name: James Olsen MRN: [id number] Date of Birth: 09-18-1946 Referring Provider (PT): Meredith Staggers, MD   Encounter Date: 12/12/2021   PT End of Session - 12/12/21 1554     Visit Number 21    Number of Visits 33    Date for PT Re-Evaluation 01/30/22    Authorization Type Medicare and [place] - 10th visit PN and KX modifier required    Progress Note Due on Visit 30    PT Start Time 1450    PT Stop Time 1530    PT Time Calculation (min) 40 min    Equipment Utilized During Treatment --   R AFO   Activity Tolerance Patient tolerated treatment well    Behavior During Therapy WFL for tasks assessed/performed             Past Medical History:  Diagnosis Date   Allergy    Arthritis    Cataract    GERD (gastroesophageal reflux disease)    Glaucoma    pt denies.    History of blood transfusion     5 units after previous surgery   History of radiation therapy 12/09/18- 01/24/2019   Left sagittal brain, 11.8 Gy in 31 fractions for a total dose of 55.8 Gy   Hyperlipidemia    Macular degeneration    Seizures ([place])    Stroke [facility])    after surgery in August 2019 mostly recovered still some weakness in left arm   Wears glasses     Past Surgical History:  Procedure Laterality Date   APPLICATION OF CRANIAL NAVIGATION N/A 06/21/2021   Procedure: APPLICATION OF CRANIAL NAVIGATION;  Surgeon: [name], MD;  Location: [place];  Service: Neurosurgery;  Laterality: N/A;   BRAIN SURGERY     CHOLECYSTECTOMY  2008   lap choli   COLONOSCOPY     CRANIOPLASTY N/A 10/29/2018   Procedure: CRANIOPLASTY;  Surgeon: [name], MD;  Location: [place];  Service: Neurosurgery;  Laterality: N/A;  CRANIOPLASTY   CRANIOTOMY N/A 08/18/2018   Procedure: CRANIOTOMY TUMOR EXCISION;  Surgeon:  [name], MD;  Location: [place];  Service: Neurosurgery;  Laterality: N/A;  CRANIOTOMY TUMOR EXCISION   CRANIOTOMY Bilateral 06/21/2021   Procedure: Bilateral Craniotomy for tumor resection;  Surgeon: [name], MD;  Location: [place];  Service: Neurosurgery;  Laterality: Bilateral;   DIAGNOSTIC LAPAROSCOPY     EYE SURGERY     laser surgery to relieve pressure   NAILBED REPAIR Left 05/04/2014   Procedure: LEFT INDEX NAIL ABLATION V-Y FLAP COVERAGE;  Surgeon: [name]., MD;  Location: [place];  Service: Orthopedics;  Laterality: Left;  index   PILONIDAL CYST EXCISION     x2   TONSILLECTOMY     VASECTOMY      There were no vitals filed for this visit.   Subjective Assessment - 12/12/21 1458     Subjective Saw Dr. Naaman Plummer - increased Baclofen at night to help with spasms and decreased urination medication.    Spasms usually wake him up but aren't painful.    Pertinent History OA, h/o blood transfusion, h/o radiation therapy, macular degeneration, CVA, craniotomy > cranioplasty and resection of meningioma, R hemiparesis, focal seizures, urinary retention    Limitations Standing;Walking;House hold activities    Patient Stated Goals Walking, gain more  strength in RLE to be able to drive in 6 months if seizure free              [facility] Adult PT Treatment/Exercise - 12/12/21 1502       Neuro Re-ed    Neuro Re-ed Details  NMR in quadruped for proximal hip strengthening and stabilization training with alternating LE straight leg hip extensions x 5 reps each side with therapist providing tactile cues for R shoulder protraction and R hip activation to maintain balance over RLE and RUE when extending LLE.  Also performed alternating hip extension with knee flexed to 90 x 5 reps each side - pt only able to perform small ROM on RLE when keeping knee flexed.  Pt reported significant fatigue in UE with quadruped.      Knee/Hip Exercises: Aerobic   Tread Mill Forwards  with incline at 3% x 4 minutes at 0.7 mph with therapist providing facilitation of lateral and anterior weight shifting and cues for increased stance time on RLE and increased step length LLE.  Changed to backwards walking, 0% incline, 0.3 mph x 1 minute x 2 reps to focus on hamstring and hip extension activation, required max A to advance RLE backwards and cues for increased stance on RLE, increased step length LLE.               PT Short Term Goals - 12/01/21 1609       PT SHORT TERM GOAL #1   Title = LTG set at 4 weeks (12/31/21) and then can be set again at 8 weeks (01/30/22)    Target Date 01/30/22               PT Long Term Goals - 12/01/21 1611       PT LONG TERM GOAL #1   Title Pt will demonstrate independence with final HEP    Time 4    Period Weeks    Status Revised    Target Date 12/31/21      PT LONG TERM GOAL #2   Title Pt will increase gait velocity with AFO and cane to >/= 2.62 ft/sec without LOB    Baseline 11/21/21 with RW and AFO 2.97 ft/sec    Time 4    Period Weeks    Status Revised    Target Date 12/31/21      PT LONG TERM GOAL #3   Title Pt will increase score on Mini-Best test by 4 points to indicate decreased falls risk    Baseline 11/21/21 16/30    Time 4    Period Weeks    Status Revised    Target Date 12/31/21      PT LONG TERM GOAL #4   Title Pt will ambulate x 250' outside over uneven pavement, negotiate ramp, curb and 12 stairs with AFO and CANE, MOD I.    Time 4    Period Weeks    Status Revised    Target Date 12/31/21                   Plan - 12/12/21 1556     Clinical Impression Statement Utilized treadmill forwards with slight incline and backwards walking for blocked practice of reciprocal stepping, to facilitate increased R stance time, and increase activation of glute and hamstrings on RLE.  Also utilized quadruped to facilitate increased proximal stability and glute/hamstring activation.  Pt fatigued more quickly  today and continues to demonstrate increased R foot drag with fatigue.  Pt PT is on hold for 3 weeks while he receives radiation treatment.  Will re-assess funcitonal level when pt returns.    Comorbidities OA, h/o blood transfusion, h/o radiation therapy, macular degeneration, CVA, craniotomy > cranioplasty and resection of meningioma, R hemiparesis, focal seizures, urinary retention    Rehab Potential Good    PT Frequency 2x / week    PT Duration 8 weeks    PT Treatment/Interventions ADLs/Self Care Home Management;Aquatic Therapy;DME Instruction;Gait training;Stair training;Functional mobility training;Therapeutic activities;Therapeutic exercise;Balance training;Neuromuscular re-education;Patient/family education;Passive range of motion    PT Next Visit Plan CHECK LTG and reset LTG (cert is good through 2/9).  No estim due to CA and seizures. Continue to work on safe gait with cane with quad tip (indoors and outdoors), obstacles, stairs, curb, ramp, treamill-inclined and retro; balance training and LE strengthening. as able continue high intensity circuit and gait training (HR to stay within 102-132-can do any functional task, if becoming easy, weight him, add speed, etc).  NMR for RLE.    PT Home Exercise Plan Access Code: [email or web address]    Consulted and Agree with Plan of Care Patient             Patient will benefit from skilled therapeutic intervention in order to improve the following deficits and impairments:  Abnormal gait, Decreased activity tolerance, Decreased balance, Decreased strength, Difficulty walking  Visit Diagnosis: Unsteadiness on feet  Muscle weakness (generalized)  Foot drop, right  Other abnormalities of gait and mobility     Problem List Patient Active Problem List   Diagnosis Date Noted   Spasticity 09/11/2021   Urinary retention 07/17/2021   S/P resection of meningioma 06/26/2021   S/P craniotomy 06/21/2021   Focal seizures ([place]) 05/06/2021   Atypical  meningioma of brain ([place]) 11/13/2018   Other abnormalities of gait and mobility 09/17/2018   Acute cerebral infarction ([facility])    Thrombocytopenia ([facility])    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 [place], [place], [address] Phone: [phone]   Fax:  [phone]  Name: James Olsen MRN: [id number] Date of Birth: 09-Jun-1946

## 2021-12-13 ENCOUNTER — Ambulatory Visit
Admission: RE | Admit: 2021-12-13 | Discharge: 2021-12-13 | Disposition: A | Discharge status: Home / Self Care | Payer: Medicare Other | Source: Ambulatory Visit | Attending: Radiation Oncology | Admitting: Radiation Oncology

## 2021-12-13 DIAGNOSIS — D329 Benign neoplasm of meninges, unspecified: Secondary | ICD-10-CM | POA: Diagnosis not present

## 2021-12-13 NOTE — Progress Notes (Signed)
Nurse monitoring complete status post 1 of 5 SRS treatments. Patient without complaints. Patient denies new or worsening neurologic symptoms. Vitals stable. Instructed patient to avoid strenuous activity for the next 24 hours.  Instructed patient to call [phone] with needs related to treatment after hours or over the weekend. Patient and his wife verbalized understanding. Patient ambulated out of clinic utilizing rolling walker without incident  Vitals:   12/13/21 1225  BP: 122/78  Pulse: 70  Resp: 18  Temp: (!) 96.6 F (35.9 C)  SpO2: 100%

## 2021-12-17 ENCOUNTER — Ambulatory Visit
Admission: RE | Admit: 2021-12-17 | Discharge: 2021-12-17 | Disposition: A | Discharge status: Home / Self Care | Payer: Medicare Other | Source: Ambulatory Visit | Attending: Radiation Oncology | Admitting: Radiation Oncology

## 2021-12-17 ENCOUNTER — Other Ambulatory Visit: Payer: Self-pay

## 2021-12-17 VITALS — BP 114/73 | HR 64 | Temp 96.7°F | Resp 18

## 2021-12-17 DIAGNOSIS — D329 Benign neoplasm of meninges, unspecified: Secondary | ICD-10-CM | POA: Diagnosis not present

## 2021-12-17 DIAGNOSIS — D420 Neoplasm of uncertain behavior of cerebral meninges: Secondary | ICD-10-CM

## 2021-12-17 NOTE — Progress Notes (Signed)
Nurse monitoring complete status post 2 of 5 SRS treatments. Patient without complaints. Patient denies new or worsening neurologic symptoms. Vitals stable. Instructed patient to avoid strenuous activity for the next 24 hours.  Instructed patient to call [phone] with needs related to treatment after hours or over the weekend. Patient and his wife verbalized understanding. Patient ambulated out of clinic utilizing rolling walker without incident  Vitals:   12/17/21 1110  BP: 114/73  Pulse: 64  Resp: 18  Temp: (!) 96.7 F (35.9 C)  SpO2: 100%

## 2021-12-20 ENCOUNTER — Ambulatory Visit
Admission: RE | Admit: 2021-12-20 | Discharge: 2021-12-20 | Disposition: A | Discharge status: Home / Self Care | Payer: Medicare Other | Source: Ambulatory Visit | Attending: Radiation Oncology | Admitting: Radiation Oncology

## 2021-12-20 ENCOUNTER — Ambulatory Visit: Payer: Medicare Other | Admitting: Physical Therapy

## 2021-12-20 ENCOUNTER — Other Ambulatory Visit: Payer: Self-pay

## 2021-12-20 VITALS — BP 110/70 | HR 67 | Temp 97.8°F | Resp 20

## 2021-12-20 DIAGNOSIS — D420 Neoplasm of uncertain behavior of cerebral meninges: Secondary | ICD-10-CM

## 2021-12-20 DIAGNOSIS — D329 Benign neoplasm of meninges, unspecified: Secondary | ICD-10-CM | POA: Diagnosis not present

## 2021-12-20 NOTE — Progress Notes (Signed)
James Olsen had [facility] Brain treatment and was here in nursing for observation.  No complaints, denies headache, blurred vision, and ringing of the ears.  Accompanied by his wife he ambulated walker out after observation was over.

## 2021-12-24 ENCOUNTER — Other Ambulatory Visit: Payer: Self-pay

## 2021-12-24 ENCOUNTER — Ambulatory Visit
Admission: RE | Admit: 2021-12-24 | Discharge: 2021-12-24 | Disposition: A | Discharge status: Home / Self Care | Payer: Medicare Other | Source: Ambulatory Visit | Attending: Radiation Oncology | Admitting: Radiation Oncology

## 2021-12-24 ENCOUNTER — Ambulatory Visit: Payer: Medicare Other | Admitting: Physical Therapy

## 2021-12-24 DIAGNOSIS — R569 Unspecified convulsions: Secondary | ICD-10-CM | POA: Insufficient documentation

## 2021-12-24 DIAGNOSIS — D420 Neoplasm of uncertain behavior of cerebral meninges: Secondary | ICD-10-CM | POA: Insufficient documentation

## 2021-12-24 NOTE — Progress Notes (Signed)
Nurse monitoring complete status post 4 of 5 SRS treatments. Patient without complaints. Patient denies new or worsening neurologic symptoms. Vitals stable. Instructed patient to avoid strenuous activity for the next 24 hours. Instructed patient to call [phone] with needs related to treatment after hours or over the weekend.Patient and his wife verbalized understanding. Patient ambulated out of clinic utilizing rolling walker without incident  Vitals:   12/24/21 1112  BP: 128/84  Pulse: 63  Resp: 18  Temp: (!) 96.4 F (35.8 C)  SpO2: 100%

## 2021-12-26 ENCOUNTER — Ambulatory Visit
Admission: RE | Admit: 2021-12-26 | Discharge: 2021-12-26 | Disposition: A | Discharge status: Home / Self Care | Payer: Medicare Other | Source: Ambulatory Visit | Attending: Radiation Oncology | Admitting: Radiation Oncology

## 2021-12-26 ENCOUNTER — Ambulatory Visit: Payer: Medicare Other | Admitting: Physical Therapy

## 2021-12-26 ENCOUNTER — Encounter: Payer: Self-pay | Admitting: Radiation Oncology

## 2021-12-26 ENCOUNTER — Other Ambulatory Visit: Payer: Self-pay

## 2021-12-26 DIAGNOSIS — D420 Neoplasm of uncertain behavior of cerebral meninges: Secondary | ICD-10-CM | POA: Diagnosis not present

## 2021-12-26 NOTE — Progress Notes (Signed)
Nurse monitoring complete status post 5 of 5 SRS treatments. Patient without complaints. Patient denies new or worsening neurologic symptoms. Vitals stable. Instructed patient to avoid strenuous activity for the next 24 hours. Instructed patient to call [phone] with needs related to treatment after hours or over the weekend.Patient and his wife verbalized understanding. Patient ambulated out of clinic utilizing rolling walker without incident.  BP 118/73    Pulse 66    Temp (!) 96.1 F (35.6 C)    Resp 18    SpO2 100%    James Olsen M. Leonie Green, BSN

## 2021-12-30 ENCOUNTER — Ambulatory Visit: Payer: Medicare Other | Attending: Family Medicine | Admitting: Physical Therapy

## 2021-12-30 ENCOUNTER — Other Ambulatory Visit: Payer: Self-pay

## 2021-12-30 DIAGNOSIS — R2681 Unsteadiness on feet: Secondary | ICD-10-CM | POA: Insufficient documentation

## 2021-12-30 DIAGNOSIS — R2689 Other abnormalities of gait and mobility: Secondary | ICD-10-CM | POA: Diagnosis present

## 2021-12-30 DIAGNOSIS — M21371 Foot drop, right foot: Secondary | ICD-10-CM | POA: Diagnosis present

## 2021-12-30 DIAGNOSIS — M6281 Muscle weakness (generalized): Secondary | ICD-10-CM | POA: Insufficient documentation

## 2021-12-30 NOTE — Therapy (Signed)
[facility] [address] [place], [place], [address] Phone: [phone]   Fax:  [phone]  Physical Therapy Treatment  Patient Details  Name: James Olsen MRN: [id number] Date of Birth: 02/02/1946 Referring Provider (PT): Meredith Staggers, MD   Encounter Date: 12/30/2021   PT End of Session - 12/30/21 1242     Visit Number 22    Number of Visits 33    Date for PT Re-Evaluation 01/30/22    Authorization Type Medicare and [place] - 10th visit PN and KX modifier required    Progress Note Due on Visit 30    PT Start Time 1138    PT Stop Time 1238    PT Time Calculation (min) 60 min    Equipment Utilized During Treatment --   R AFO   Activity Tolerance Patient tolerated treatment well    Behavior During Therapy WFL for tasks assessed/performed             Past Medical History:  Diagnosis Date   Allergy    Arthritis    Cataract    GERD (gastroesophageal reflux disease)    Glaucoma    pt denies.    History of blood transfusion     5 units after previous surgery   History of radiation therapy 12/09/18- 01/24/2019   Left sagittal brain, 11.8 Gy in 31 fractions for a total dose of 55.8 Gy   Hyperlipidemia    Macular degeneration    Seizures ([place])    Stroke [facility])    after surgery in August 2019 mostly recovered still some weakness in left arm   Wears glasses     Past Surgical History:  Procedure Laterality Date   APPLICATION OF CRANIAL NAVIGATION N/A 06/21/2021   Procedure: APPLICATION OF CRANIAL NAVIGATION;  Surgeon: [name], MD;  Location: [place];  Service: Neurosurgery;  Laterality: N/A;   BRAIN SURGERY     CHOLECYSTECTOMY  2008   lap choli   COLONOSCOPY     CRANIOPLASTY N/A 10/29/2018   Procedure: CRANIOPLASTY;  Surgeon: [name], MD;  Location: [place];  Service: Neurosurgery;  Laterality: N/A;  CRANIOPLASTY   CRANIOTOMY N/A 08/18/2018   Procedure: CRANIOTOMY TUMOR EXCISION;  Surgeon:  [name], MD;  Location: [place];  Service: Neurosurgery;  Laterality: N/A;  CRANIOTOMY TUMOR EXCISION   CRANIOTOMY Bilateral 06/21/2021   Procedure: Bilateral Craniotomy for tumor resection;  Surgeon: [name], MD;  Location: [place];  Service: Neurosurgery;  Laterality: Bilateral;   DIAGNOSTIC LAPAROSCOPY     EYE SURGERY     laser surgery to relieve pressure   NAILBED REPAIR Left 05/04/2014   Procedure: LEFT INDEX NAIL ABLATION V-Y FLAP COVERAGE;  Surgeon: [name]., MD;  Location: [place];  Service: Orthopedics;  Laterality: Left;  index   PILONIDAL CYST EXCISION     x2   TONSILLECTOMY     VASECTOMY      There were no vitals filed for this visit.   Subjective Assessment - 12/30/21 1141     Subjective In the mornings he has felt weak, "wonky" and slightly disoriented - not sure if this is due to the radiation treatments or nightly medications.  Does feel better once he gets up and starts moving around.  Radiation treatments are done for now; will have another scan in the future to assess tumors.  Pt feels he has gone backwards a little bit due to being less active (less PT,  less HEP).  Balance has declined and foot drop has increased.    Pertinent History OA, h/o blood transfusion, h/o radiation therapy, macular degeneration, CVA, craniotomy > cranioplasty and resection of meningioma, R hemiparesis, focal seizures, urinary retention    Limitations Standing;Walking;House hold activities    Patient Stated Goals Walking, gain more strength in RLE to be able to drive in 6 months if seizure free    Currently in Pain? No/denies                [facility] PT Assessment - 12/30/21 1154       Assessment   Medical Diagnosis Atypical Meningioma and R hemiparesis    Referring Provider (PT) Meredith Staggers, MD    Onset Date/Surgical Date 09/11/21    Hand Dominance Right    Prior Therapy Outpatient, CIR, HH      Precautions   Precautions Other (comment)     Precaution Comments OA, h/o blood transfusion, h/o radiation therapy, macular degeneration, CVA, craniotomy > cranioplasty and resection of meningioma, R hemiparesis, focal seizures, urinary retention    Required Braces or Orthoses Other Brace/Splint    Other Brace/Splint AFO      Prior Function   Level of Independence Independent      Ambulation/Gait   Gait velocity 2.5 ft/sec with RW (12.9 seconds); with cane 1.9 ft/sec (16.8 seconds)      Standardized Balance Assessment   10 Meter Walk see gait velocity above      Mini-BESTest   Sit To Stand Normal: Comes to stand without use of hands and stabilizes independently.    Rise to Toes Moderate: Heels up, but not full range (smaller than when holding hands), OR noticeable instability for 3 s.    Stand on one leg (left) Moderate: < 20 s    Stand on one leg (right) Severe: Unable    Stand on one leg - lowest score 0    Compensatory Stepping Correction - Forward Moderate: More than one step is required to recover equilibrium    Compensatory Stepping Correction - Backward No step, OR would fall if not caught, OR falls spontaneously.    Compensatory Stepping Correction - Left Lateral Severe: Falls, or cannot step    Compensatory Stepping Correction - Right Lateral Severe:  Falls, or cannot step    Stepping Corredtion Lateral - lowest score 0    Stance - Feet together, eyes open, firm surface  Normal: 30s    Stance - Feet together, eyes closed, foam surface  Moderate: < 30s    Incline - Eyes Closed Normal: Stands independently 30s and aligns with gravity    Change in Gait Speed Normal: Significantly changes walkling speed without imbalance    Walk with head turns - Horizontal Normal: performs head turns with no change in gait speed and good balance    Walk with pivot turns Moderate:Turns with feet close SLOW (>4 steps) with good balance.    Step over obstacles Moderate: Steps over box but touches box OR displays cautious behavior by slowing  gait.    Timed UP & GO with Dual Task Moderate: Dual Task affects either counting OR walking (>10%) when compared to the TUG without Dual Task.   used cane; cognitive task, naming months backwards   Mini-BEST total score 16            Intermittent mod-max assistance required to recover balance when ambulating or pivoting with cane.  Pt would experience delay in RLE swing phase initiation  and decreased R foot clearance causing pt to lose his balance forwards; did not demonstrate stepping strategy to recover.      PT Education - 12/30/21 1242     Education Details progress towards goals, areas to continue to focus on    Person(s) Educated Patient    Methods Explanation    Comprehension Verbalized understanding              PT Short Term Goals - 12/01/21 1609       PT SHORT TERM GOAL #1   Title = LTG set at 4 weeks (12/31/21) and then can be set again at 8 weeks (01/30/22)    Target Date 01/30/22               PT Long Term Goals - 12/30/21 1242       PT LONG TERM GOAL #1   Title Pt will demonstrate independence with final HEP    Baseline 1/9: was not able to perform consistently while on hold, will review next session    Time 4    Period Weeks    Status Revised    Target Date 01/30/22      PT LONG TERM GOAL #2   Title Pt will increase gait velocity with AFO and cane to >/= 2.62 ft/sec without LOB    Baseline 2.5 with RW; 1.9 ft/sec with cane and min A    Time 4    Period Weeks    Status On-going    Target Date 01/30/22      PT LONG TERM GOAL #3   Title Pt will increase score on Mini-Best test by 4 points to indicate decreased falls risk    Baseline 1/9: score unchanged 16/28    Time 4    Period Weeks    Status On-going    Target Date 01/30/22      PT LONG TERM GOAL #4   Title Pt will ambulate x 250' outside over uneven pavement, negotiate ramp, curb and 12 stairs with AFO and CANE, MOD I.    Time 4    Period Weeks    Status On-going    Target Date  01/30/22               Plan - 12/30/21 1246     Clinical Impression Statement Pt returns after being placed on PT hold due to radiation treatments.  Performed assessment of current function to determine if goals are still appropriate.  Pt did not meet any LTG but also did not experience a significant decline in objective measures.  Pt experienced slight decline in gait velocity with RW but not to baseline measures at evaluation and pt has maintained gait velocity with cane.  Pt has also maintained Mini-Best Test score.  Pt continues to have greater difficulty with more dynamic standing balance, dynamic gait challenges with decreased UE support, and reactive postural control.  Pt will benefit from continued skilled PT services to address ongoing impairments and unmet goals to maximize functional mobility independence and decrease falls risk.    Comorbidities OA, h/o blood transfusion, h/o radiation therapy, macular degeneration, CVA, craniotomy > cranioplasty and resection of meningioma, R hemiparesis, focal seizures, urinary retention    Rehab Potential Good    PT Frequency 2x / week    PT Duration 8 weeks    PT Treatment/Interventions ADLs/Self Care Home Management;Aquatic Therapy;DME Instruction;Gait training;Stair training;Functional mobility training;Therapeutic activities;Therapeutic exercise;Balance training;Neuromuscular re-education;Patient/family education;Passive range of motion    PT Next  Visit Plan Review and reset HEP if needed.  No estim due to CA and seizures. Continue to work on safe gait with cane with quad tip (indoors and outdoors), obstacles, stairs, curb, ramp, treamill-inclined and retro; stepping strategy and other fast balance reactions. as able continue high intensity circuit and gait training (HR to stay within 102-132-can do any functional task, if becoming easy, weight him, add speed, etc).  NMR for RLE.    PT Home Exercise Plan Access Code: [email or web address]    Consulted and  Agree with Plan of Care Patient             Patient will benefit from skilled therapeutic intervention in order to improve the following deficits and impairments:  Abnormal gait, Decreased activity tolerance, Decreased balance, Decreased strength, Difficulty walking  Visit Diagnosis: Unsteadiness on feet  Muscle weakness (generalized)  Foot drop, right  Other abnormalities of gait and mobility     Problem List Patient Active Problem List   Diagnosis Date Noted   Spasticity 09/11/2021   Urinary retention 07/17/2021   S/P resection of meningioma 06/26/2021   S/P craniotomy 06/21/2021   Focal seizures ([place]) 05/06/2021   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   Other abnormalities of gait and mobility 09/17/2018   Acute cerebral infarction ([facility])    Thrombocytopenia ([facility])    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 ([place])    Acute blood loss anemia    Generalized OA    Gastroesophageal reflux disease    Seizure prophylaxis    Leukocytosis    Tumor 08/18/2018   MACULAR DEGENERATION 02/29/2008   ALLERGY 02/29/2008   Nonspecific (abnormal) findings on radiological and other examination of body structure 12/26/2007   NONSPECIFIC ABNORM FIND RAD&OTH EXAM LUNG FIELD 12/26/2007   GALLSTONES 10/01/2007   ELEVATION, TRANSAMINASE/LDH LEVELS 09/06/2007   HEPATITIS NOS 09/01/2007   ABDOMINAL PAIN, EPIGASTRIC 09/01/2007   Rico Junker, PT, DPT 12/30/21    12:55 PM    [facility] [address] [place], [place], [address] Phone: [phone]   Fax:  [phone]  Name: HOLTEN SPANO MRN: [id number] Date of Birth: 06-09-1946

## 2022-01-01 ENCOUNTER — Telehealth: Payer: Self-pay | Admitting: Radiation Therapy

## 2022-01-01 NOTE — Telephone Encounter (Signed)
I called Mr. Dinneen to check in. He said that he feels markedly weaker on RT side since the completion of his SRT course on 12/26/20. This began between the fourth and fifth fraction, but he contributed it to having radiation treatments. The weakness has also been appreciated during his PT sessions by the therapy providers. Mr. Bail has also noticed about an hour in the morning where he is dizzy, off balance and slow processing information or finding words. This has been happening for about an hour when he awakens, when it passes, he feels back at baseline. The weakness does not improve and persists throughout the day.    I have routed this information to Dr. Isidore Moos, Dr. Mickeal Skinner and Dr. Pearlie Oyster nurse, Althia Forts.    Mont Dutton R.T.(R)(T) Radiation Special Procedures Navigator

## 2022-01-02 ENCOUNTER — Ambulatory Visit: Payer: Medicare Other | Admitting: Physical Therapy

## 2022-01-02 ENCOUNTER — Other Ambulatory Visit: Payer: Self-pay

## 2022-01-02 ENCOUNTER — Encounter: Payer: Self-pay | Admitting: Physical Therapy

## 2022-01-02 ENCOUNTER — Inpatient Hospital Stay (HOSPITAL_BASED_OUTPATIENT_CLINIC_OR_DEPARTMENT_OTHER): Payer: Medicare Other | Admitting: Internal Medicine

## 2022-01-02 DIAGNOSIS — R2681 Unsteadiness on feet: Secondary | ICD-10-CM | POA: Diagnosis not present

## 2022-01-02 DIAGNOSIS — R569 Unspecified convulsions: Secondary | ICD-10-CM | POA: Diagnosis not present

## 2022-01-02 DIAGNOSIS — M21371 Foot drop, right foot: Secondary | ICD-10-CM

## 2022-01-02 DIAGNOSIS — D420 Neoplasm of uncertain behavior of cerebral meninges: Secondary | ICD-10-CM

## 2022-01-02 DIAGNOSIS — M6281 Muscle weakness (generalized): Secondary | ICD-10-CM

## 2022-01-02 DIAGNOSIS — R2689 Other abnormalities of gait and mobility: Secondary | ICD-10-CM

## 2022-01-02 MED ORDER — DEXAMETHASONE 4 MG PO TABS: 4.0000 mg | ORAL_TABLET | Freq: Every day | ORAL | 1 refills | Status: DC

## 2022-01-02 NOTE — Progress Notes (Signed)
I connected with James Olsen on 01/02/22 at  9:30 AM EST by telephone visit and verified that I am speaking with the correct person using two identifiers.  I discussed the limitations, risks, security and privacy concerns of performing an evaluation and management service by telemedicine and the availability of in-person appointments. I also discussed with the patient that there may be a patient responsible charge related to this service. The patient expressed understanding and agreed to proceed.  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  He continues to ambulate with a rolling walker.  He also describes feeling "slow, disoriented, weaker" for about 1 hour after waking up each morning.  Only 1 morning this past week he did not experience these symptoms.  Observations: Language and cognition at baseline  Imaging:  [place] Clinician Interpretation: I have personally reviewed the CNS images as listed.  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 Benign neoplasm, brain/CNS; 3T SRS protocol for treatment planning of recurrent meningioma. EXAM: MRI HEAD WITHOUT AND WITH CONTRAST TECHNIQUE: Multiplanar, multiecho pulse sequences of the brain and surrounding structures were obtained without and with intravenous contrast. CONTRAST:  27mL MULTIHANCE GADOBENATE DIMEGLUMINE 529 MG/ML IV SOLN COMPARISON:  MRI of the brain November 09, 2021. FINDINGS: Brain: No acute infarction, acute hemorrhage,  hydrocephalus or extra-axial collection. Postsurgical changes from bilateral high convexity craniotomy with subjacent areas of encephalomalacia and gliosis in the posterior aspect of the paramedian right frontal lobe and more evident in the left frontoparietal region are unchanged. There is also dural thickening in enhancement subjacent to the craniotomy with 2 mass-like area of heterogeneous contrast enhancement, the largest at midline near the posterior aspect of the craniotomy in the parietal region, measuring approximately 22 x 9 mm (series 14, image 21), compared to 21 x 6 mm on prior. The second area of nodular contrast enhancement is more anterior on the right frontal region, near the margin of the craniotomy, measuring approximately 13 x 8 mm (series 13, image 24) compared to 10 x 7 mm on prior. Continued interval increase in size of the left posterior frontal meningioma measuring now 20 x 15 mm (18 x 12 mm on prior). There is mass effect on the brain parenchyma without clear evidence of invasion or edema. Scattered foci of T2 hyperintensity elsewhere within the white matter of the cerebral hemispheres has not significantly changed. Vascular: Normal flow voids. Skull and upper cervical spine: Postsurgical changes from bilateral frontoparietal craniotomy. Marrow signal is otherwise maintained. Sinuses/Orbits: Mucous retention cyst in the left maxillary sinus. The orbits are maintained. Other: None. IMPRESSION: Further interval progression of the residual/recurrent high convexity meningioma as well as mild interval increase of the posterior left frontal meningioma. Electronically Signed   By: Pedro Earls M.D.   On: 12/05/2021 12:50    Assessment and Plan: Atypical meningioma of brain James Olsen)  Focal seizures (James Olsen)  James Olsen presents with clinical syndrome most likely consistent with acute post-radiosurgery inflammation, specifically affecting the left mesial frontal region.  This  is  not surprising in lieu of heavy pre-treatment, prior RT in that location.  We recommended initiating course of dexamethasone; 4mg  BID x2 days, followed by 4mg  daily.  We will touch base with him via phone again in 1 week.  If not improved at all, or continues to experience decline in motor function, would consider obtaining MRI study.  He is agreeable with this plan.  James Sellers, MD   I provided 25 minutes of non face-to-face telephone visit time during this encounter, and > 50% was spent counseling as documented under my assessment & plan.

## 2022-01-02 NOTE — Therapy (Signed)
[facility] [address] [place], [place], [address] Phone: [phone]   Fax:  [phone]  Physical Therapy Treatment  Patient Details  Name: James Olsen MRN: [id number] Date of Birth: October 27, 1946 Referring Provider (PT): Meredith Staggers, MD   Encounter Date: 01/02/2022   PT End of Session - 01/02/22 1324     Visit Number 23    Number of Visits 33    Date for PT Re-Evaluation 01/30/22    Authorization Type Medicare and [place] - 10th visit PN    Progress Note Due on Visit 30    PT Start Time 1319    PT Stop Time 1400    PT Time Calculation (min) 41 min    Equipment Utilized During Treatment Gait belt   R AFO   Activity Tolerance Patient tolerated treatment well;Patient limited by fatigue    Behavior During Therapy WFL for tasks assessed/performed             Past Medical History:  Diagnosis Date   Allergy    Arthritis    Cataract    GERD (gastroesophageal reflux disease)    Glaucoma    pt denies.    History of blood transfusion     5 units after previous surgery   History of radiation therapy 12/09/18- 01/24/2019   Left sagittal brain, 11.8 Gy in 31 fractions for a total dose of 55.8 Gy   Hyperlipidemia    Macular degeneration    Seizures ([place])    Stroke [facility])    after surgery in August 2019 mostly recovered still some weakness in left arm   Wears glasses     Past Surgical History:  Procedure Laterality Date   APPLICATION OF CRANIAL NAVIGATION N/A 06/21/2021   Procedure: APPLICATION OF CRANIAL NAVIGATION;  Surgeon: [name], MD;  Location: [facility];  Service: Neurosurgery;  Laterality: N/A;   BRAIN SURGERY     CHOLECYSTECTOMY  2008   lap choli   COLONOSCOPY     CRANIOPLASTY N/A 10/29/2018   Procedure: CRANIOPLASTY;  Surgeon: [name], MD;  Location: [facility];  Service: Neurosurgery;  Laterality: N/A;  CRANIOPLASTY   CRANIOTOMY N/A 08/18/2018   Procedure: CRANIOTOMY TUMOR EXCISION;   Surgeon: [name], MD;  Location: [facility];  Service: Neurosurgery;  Laterality: N/A;  CRANIOTOMY TUMOR EXCISION   CRANIOTOMY Bilateral 06/21/2021   Procedure: Bilateral Craniotomy for tumor resection;  Surgeon: [name], MD;  Location: [place];  Service: Neurosurgery;  Laterality: Bilateral;   DIAGNOSTIC LAPAROSCOPY     EYE SURGERY     laser surgery to relieve pressure   NAILBED REPAIR Left 05/04/2014   Procedure: LEFT INDEX NAIL ABLATION V-Y FLAP COVERAGE;  Surgeon: [name]., MD;  Location: [facility] [facility];  Service: Orthopedics;  Laterality: Left;  index   PILONIDAL CYST EXCISION     x2   TONSILLECTOMY     VASECTOMY      There were no vitals filed for this visit.   Subjective Assessment - 01/02/22 1321     Subjective Has talked with [facility] office. He has been priscribed a steriod tapered dose for the next few weeks to address possible swelling/pressure on the brain that could be contributing to his dizziness/fatigue. No dizziness at this time. Energy is "a little low" and right leg is "a little rubbery, but heavy" with pins/needles/nu7numbness.    Pertinent History OA, h/o blood transfusion, h/o radiation therapy, macular degeneration, CVA, craniotomy > cranioplasty  and resection of meningioma, R hemiparesis, focal seizures, urinary retention    Limitations Standing;Walking;House hold activities    Patient Stated Goals Walking, gain more strength in RLE to be able to drive in 6 months if seizure free    Currently in Pain? No/denies                   [facility] Adult PT Treatment/Exercise - 01/02/22 1325       Transfers   Transfers Sit to Stand;Stand to Sit    Sit to Stand 5: Supervision;4: Min guard;With upper extremity assist;From bed;From chair/3-in-1    Stand to Sit 5: Supervision;With upper extremity assist;To bed;To chair/3-in-1      Ambulation/Gait   Ambulation/Gait Yes    Ambulation/Gait Assistance 4: Min guard;4: Min assist     Ambulation/Gait Assistance Details use of RW to enter/exit session. Use of straight cane in session with increased assistance needed after Scifit due to increased right LE weakness. Pt with increased difficulty lifting right LE at hip.    Ambulation Distance (Feet) 20 Feet   x2   Assistive device Straight cane;Rolling walker    Gait Pattern Step-through pattern;Decreased step length - right;Decreased arm swing - right;Decreased stride length;Decreased hip/knee flexion - right;Decreased dorsiflexion - right;Poor foot clearance - right    Ambulation Surface Level;Indoor      Exercises   Exercises Other Exercises    Other Exercises  reviewed and revised HEP with focus on strengthening. Will add balance ex's over next few sessions. Refer to [place] for full details. Cues on ex form/technique needed.      Knee/Hip Exercises: Aerobic   Stepper Scifit stepper x 8 mins with BUEs/LEs at level 2 resistance at moderate intensity for overall strengthening and activity tolerance.            Issued to HEP this session:  Access Code: [email or web address] URL: [email or web address] Date: 01/02/2022 Prepared by: Willow Ora  Exercises Single Leg Bridge - 1 x daily - 5 x weekly - 1 sets - 10 reps Modified Thomas Stretch - 1 x daily - 5 x weekly - 1 sets - 10 reps Sit to/from Stand in Stride position - 1 x daily - 5 x weekly - 1 sets - 5-10 reps Standing Hip Abduction with Counter Support - 1 x daily - 5 x weekly - 1 sets - 10 reps Standing Hip Extension with Counter Support - 1 x daily - 5 x weekly - 1 sets - 10 reps     01/02/22 2005  PT Education  Education Details revised HEP  Person(s) Educated Patient  Methods Explanation;Demonstration;Verbal cues;Handout  Comprehension Verbalized understanding;Returned demonstration;Verbal cues required;Need further instruction       PT Education - 01/02/22 2005     Education Details revised HEP    Person(s) Educated Patient    Methods  Explanation;Demonstration;Verbal cues;Handout    Comprehension Verbalized understanding;Returned demonstration;Verbal cues required;Need further instruction              PT Short Term Goals - 12/01/21 1609       PT SHORT TERM GOAL #1   Title = LTG set at 4 weeks (12/31/21) and then can be set again at 8 weeks (01/30/22)    Target Date 01/30/22               PT Long Term Goals - 12/30/21 1242       PT LONG TERM GOAL #1   Title Pt will demonstrate independence with final HEP  Baseline 1/9: was not able to perform consistently while on hold, will review next session    Time 4    Period Weeks    Status Revised    Target Date 01/30/22      PT LONG TERM GOAL #2   Title Pt will increase gait velocity with AFO and cane to >/= 2.62 ft/sec without LOB    Baseline 2.5 with RW; 1.9 ft/sec with cane and min A    Time 4    Period Weeks    Status On-going    Target Date 01/30/22      PT LONG TERM GOAL #3   Title Pt will increase score on Mini-Best test by 4 points to indicate decreased falls risk    Baseline 1/9: score unchanged 16/28    Time 4    Period Weeks    Status On-going    Target Date 01/30/22      PT LONG TERM GOAL #4   Title Pt will ambulate x 250' outside over uneven pavement, negotiate ramp, curb and 12 stairs with AFO and CANE, MOD I.    Time 4    Period Weeks    Status On-going    Target Date 01/30/22                   Plan - 01/02/22 1325     Clinical Impression Statement Today's skilled session focused on strengthening, including updating pt's HEP. No issues other than right LE fatigue reported in session. The pt is making progress toward goals and should benefit from continued PT to progress toward unmet goals.    Comorbidities OA, h/o blood transfusion, h/o radiation therapy, macular degeneration, CVA, craniotomy > cranioplasty and resection of meningioma, R hemiparesis, focal seizures, urinary retention    Rehab Potential Good    PT  Frequency 2x / week    PT Duration 8 weeks    PT Treatment/Interventions ADLs/Self Care Home Management;Aquatic Therapy;DME Instruction;Gait training;Stair training;Functional mobility training;Therapeutic activities;Therapeutic exercise;Balance training;Neuromuscular re-education;Patient/family education;Passive range of motion    PT Next Visit Plan No estim due to CA and seizures. Continue to work on safe gait with cane with quad tip (indoors and outdoors), obstacles, stairs, curb, ramp, treamill-inclined and retro; stepping strategy and other fast balance reactions. as able continue high intensity circuit and gait training (HR to stay within 102-132-can do any functional task, if becoming easy, weight him, add speed, etc).  NMR for RLE.    PT Home Exercise Plan Access Code: [email or web address]    Consulted and Agree with Plan of Care Patient             Patient will benefit from skilled therapeutic intervention in order to improve the following deficits and impairments:  Abnormal gait, Decreased activity tolerance, Decreased balance, Decreased strength, Difficulty walking  Visit Diagnosis: Unsteadiness on feet  Muscle weakness (generalized)  Foot drop, right  Other abnormalities of gait and mobility     Problem List Patient Active Problem List   Diagnosis Date Noted   Spasticity 09/11/2021   Urinary retention 07/17/2021   S/P resection of meningioma 06/26/2021   S/P craniotomy 06/21/2021   Focal seizures ([place]) 05/06/2021   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   Other abnormalities of gait and mobility 09/17/2018   Acute cerebral infarction (H[facility])    Thrombocytopenia (H[facility])    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 ([place])    Acute blood loss anemia    Generalized  OA    Gastroesophageal reflux disease    Seizure prophylaxis    Leukocytosis    Tumor 08/18/2018   MACULAR DEGENERATION 02/29/2008   ALLERGY 02/29/2008   Nonspecific (abnormal)  findings on radiological and other examination of body structure 12/26/2007   NONSPECIFIC ABNORM FIND RAD&OTH EXAM LUNG FIELD 12/26/2007   GALLSTONES 10/01/2007   ELEVATION, TRANSAMINASE/LDH LEVELS 09/06/2007   HEPATITIS NOS 09/01/2007   ABDOMINAL PAIN, EPIGASTRIC 09/01/2007    Willow Ora, PTA, [facility] [address], [place], [place] [address] [phone] 01/02/22, 8:09 PM   Name: James Olsen MRN: [id number] Date of Birth: November 24, 1946

## 2022-01-02 NOTE — Patient Instructions (Signed)
Access Code: [email or web address] URL: [email or web address] Date: 01/02/2022 Prepared by: Willow Ora  Exercises Single Leg Bridge - 1 x daily - 5 x weekly - 1 sets - 10 reps Modified Thomas Stretch - 1 x daily - 5 x weekly - 1 sets - 10 reps Sit to/from Stand in Stride position - 1 x daily - 5 x weekly - 1 sets - 5-10 reps Standing Hip Abduction with Counter Support - 1 x daily - 5 x weekly - 1 sets - 10 reps Standing Hip Extension with Counter Support - 1 x daily - 5 x weekly - 1 sets - 10 reps

## 2022-01-03 ENCOUNTER — Telehealth: Payer: Self-pay | Admitting: Internal Medicine

## 2022-01-03 NOTE — Telephone Encounter (Signed)
Scheduled per 1/12 los, pt has been called and confirmed appt

## 2022-01-06 ENCOUNTER — Ambulatory Visit: Payer: Medicare Other | Admitting: Physical Therapy

## 2022-01-06 ENCOUNTER — Other Ambulatory Visit: Payer: Self-pay | Admitting: Internal Medicine

## 2022-01-06 ENCOUNTER — Encounter: Payer: Self-pay | Admitting: Internal Medicine

## 2022-01-06 ENCOUNTER — Other Ambulatory Visit: Payer: Self-pay

## 2022-01-06 DIAGNOSIS — R2681 Unsteadiness on feet: Secondary | ICD-10-CM | POA: Diagnosis not present

## 2022-01-06 DIAGNOSIS — R2689 Other abnormalities of gait and mobility: Secondary | ICD-10-CM

## 2022-01-06 DIAGNOSIS — M6281 Muscle weakness (generalized): Secondary | ICD-10-CM

## 2022-01-06 DIAGNOSIS — M21371 Foot drop, right foot: Secondary | ICD-10-CM

## 2022-01-06 MED ORDER — LEVETIRACETAM 500 MG PO TABS: 500.0000 mg | ORAL_TABLET | Freq: Two times a day (BID) | ORAL | 3 refills | Status: DC

## 2022-01-06 NOTE — Patient Instructions (Addendum)
Access Code: [email or web address] URL: [email or web address] Date: 01/06/2022 Prepared by: Misty Stanley  Exercises Single Leg Bridge - 1 x daily - 5 x weekly - 1 sets - 10 reps Modified Thomas Stretch - 1 x daily - 5 x weekly - 1 sets - 10 reps Sit to/from Stand in Stride position - 1 x daily - 5 x weekly - 1 sets - 5-10 reps Standing Hip Abduction with Counter Support - 1 x daily - 5 x weekly - 1 sets - 10 reps Standing Hip Extension with Counter Support - 1 x daily - 5 x weekly - 1 sets - 10 reps Single Leg Balance in March Position - 1 x daily - 7 x weekly - 2 sets - 10 reps Staggered stance with Eyes Open - 1 x daily - 7 x weekly - 2 sets - 10 reps

## 2022-01-06 NOTE — Therapy (Signed)
[facility] [address] [place], [place], [address] Phone: [phone]   Fax:  [phone]  Physical Therapy Treatment  Patient Details  Name: James Olsen MRN: [id number] Date of Birth: May 15, 1946 Referring Provider (PT): Meredith Staggers, MD   Encounter Date: 01/06/2022   PT End of Session - 01/06/22 1406     Visit Number 24    Number of Visits 33    Date for PT Re-Evaluation 01/30/22    Authorization Type Medicare and [place] - 10th visit PN    Progress Note Due on Visit 30    PT Start Time 1315    PT Stop Time 1400    PT Time Calculation (min) 45 min    Activity Tolerance Patient tolerated treatment well    Behavior During Therapy [facility] for tasks assessed/performed             Past Medical History:  Diagnosis Date   Allergy    Arthritis    Cataract    GERD (gastroesophageal reflux disease)    Glaucoma    pt denies.    History of blood transfusion     5 units after previous surgery   History of radiation therapy 12/09/18- 01/24/2019   Left sagittal brain, 11.8 Gy in 31 fractions for a total dose of 55.8 Gy   Hyperlipidemia    Macular degeneration    Seizures ([place])    Stroke [facility])    after surgery in August 2019 mostly recovered still some weakness in left arm   Wears glasses     Past Surgical History:  Procedure Laterality Date   APPLICATION OF CRANIAL NAVIGATION N/A 06/21/2021   Procedure: APPLICATION OF CRANIAL NAVIGATION;  Surgeon: [name], MD;  Location: [facility];  Service: Neurosurgery;  Laterality: N/A;   BRAIN SURGERY     CHOLECYSTECTOMY  2008   lap choli   COLONOSCOPY     CRANIOPLASTY N/A 10/29/2018   Procedure: CRANIOPLASTY;  Surgeon: [name], MD;  Location: [facility];  Service: Neurosurgery;  Laterality: N/A;  CRANIOPLASTY   CRANIOTOMY N/A 08/18/2018   Procedure: CRANIOTOMY TUMOR EXCISION;  Surgeon: [name], MD;  Location: [facility];  Service: Neurosurgery;  Laterality: N/A;   CRANIOTOMY TUMOR EXCISION   CRANIOTOMY Bilateral 06/21/2021   Procedure: Bilateral Craniotomy for tumor resection;  Surgeon: [name], MD;  Location: [facility];  Service: Neurosurgery;  Laterality: Bilateral;   DIAGNOSTIC LAPAROSCOPY     EYE SURGERY     laser surgery to relieve pressure   NAILBED REPAIR Left 05/04/2014   Procedure: LEFT INDEX NAIL ABLATION V-Y FLAP COVERAGE;  Surgeon: [name]., MD;  Location: [facility];  Service: Orthopedics;  Laterality: Left;  index   PILONIDAL CYST EXCISION     x2   TONSILLECTOMY     VASECTOMY      There were no vitals filed for this visit.   Subjective Assessment - 01/06/22 1318     Subjective Is noticing an improvement since starting steroids.  Less groggy and lightheaded in the AM, less unstable.  Pt is having a hard time going to sleep on steroids.  Exercises are going well.  Less dizziness with looking up.    Pertinent History OA, h/o blood transfusion, h/o radiation therapy, macular degeneration, CVA, craniotomy > cranioplasty and resection of meningioma, R hemiparesis, focal seizures, urinary retention    Limitations Standing;Walking;House hold activities    Patient Stated Goals Walking, gain more strength in  RLE to be able to drive in 6 months if seizure free    Currently in Pain? No/denies               [facility] Adult PT Treatment/Exercise - 01/06/22 1354       Exercises   Exercises Knee/Hip      Knee/Hip Exercises: Standing   Step Down Right;1 set;10 reps;Hand Hold: 2;Step Height: 4";Limitations    Step Down Limitations with resistance band around RLE pulling back and to the L; pt cued to activate glute med to bring RLE forwards and laterally to counteract stronger adduction during step down               Balance Exercises - 01/06/22 1404       Balance Exercises: Standing   Standing, One Foot on a Step Eyes open;Head turns;6 inch;3 reps;Limitations    Standing, One Foot on a Step Limitations R  and then L foot on step, holding x 10 seconds and then adding in single leg marches x 10 reps, each LE    Stepping Strategy Anterior;10 reps;Lateral   therapist causing perturbation, pt stepping with LLE; able to stop self with anterior, when adding in more diagonal stepping pt required UE support stop LOB.             PT Education - 01/06/22 1406     Education Details added balance back into HEP    Person(s) Educated Patient    Methods Explanation;Demonstration    Comprehension Verbalized understanding;Returned demonstration              PT Short Term Goals - 12/01/21 1609       PT SHORT TERM GOAL #1   Title = LTG set at 4 weeks (12/31/21) and then can be set again at 8 weeks (01/30/22)    Target Date 01/30/22               PT Long Term Goals - 12/30/21 1242       PT LONG TERM GOAL #1   Title Pt will demonstrate independence with final HEP    Baseline 1/9: was not able to perform consistently while on hold, will review next session    Time 4    Period Weeks    Status Revised    Target Date 01/30/22      PT LONG TERM GOAL #2   Title Pt will increase gait velocity with AFO and cane to >/= 2.62 ft/sec without LOB    Baseline 2.5 with RW; 1.9 ft/sec with cane and min A    Time 4    Period Weeks    Status On-going    Target Date 01/30/22      PT LONG TERM GOAL #3   Title Pt will increase score on Mini-Best test by 4 points to indicate decreased falls risk    Baseline 1/9: score unchanged 16/28    Time 4    Period Weeks    Status On-going    Target Date 01/30/22      PT LONG TERM GOAL #4   Title Pt will ambulate x 250' outside over uneven pavement, negotiate ramp, curb and 12 stairs with AFO and CANE, MOD I.    Time 4    Period Weeks    Status On-going    Target Date 01/30/22                   Plan - 01/06/22 1406  Clinical Impression Statement Pt demonstrated improvement in endurance and activity tolerance, balance reaction timing, and  decreased dizziness today after starting steroids last week.  Able to progress balance challenge and add balance back into HEP.  Continued to focus on RLE strengthening and stepping strategy training; pt did not require any seated rest breaks today.    Comorbidities OA, h/o blood transfusion, h/o radiation therapy, macular degeneration, CVA, craniotomy > cranioplasty and resection of meningioma, R hemiparesis, focal seizures, urinary retention    Rehab Potential Good    PT Frequency 2x / week    PT Duration 8 weeks    PT Treatment/Interventions ADLs/Self Care Home Management;Aquatic Therapy;DME Instruction;Gait training;Stair training;Functional mobility training;Therapeutic activities;Therapeutic exercise;Balance training;Neuromuscular re-education;Patient/family education;Passive range of motion    PT Next Visit Plan No estim due to CA and seizures. Continue to work on safe gait with cane with quad tip (indoors and outdoors), obstacles, stairs, curb, ramp, treamill-inclined and retro; stepping strategy and other fast balance reactions. as able continue high intensity circuit and gait training (HR to stay within 102-132-can do any functional task, if becoming easy, weight him, add speed, etc).  NMR for RLE.    PT Home Exercise Plan Access Code: [email or web address]    Consulted and Agree with Plan of Care Patient             Patient will benefit from skilled therapeutic intervention in order to improve the following deficits and impairments:  Abnormal gait, Decreased activity tolerance, Decreased balance, Decreased strength, Difficulty walking  Visit Diagnosis: Unsteadiness on feet  Muscle weakness (generalized)  Foot drop, right  Other abnormalities of gait and mobility     Problem List Patient Active Problem List   Diagnosis Date Noted   Spasticity 09/11/2021   Urinary retention 07/17/2021   S/P resection of meningioma 06/26/2021   S/P craniotomy 06/21/2021   Focal seizures ([place])  05/06/2021   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   Other abnormalities of gait and mobility 09/17/2018   Acute cerebral infarction (HCC)    Thrombocytopenia ([facility])    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 [place], [place], [address] Phone: [phone]   Fax:  [phone]  Name: James Olsen MRN: [id number] Date of Birth: 08-07-1946

## 2022-01-08 ENCOUNTER — Telehealth: Payer: Self-pay

## 2022-01-08 NOTE — Telephone Encounter (Signed)
Patient called to say he had cancelled his Neuro PT appointment for tomorrow. Patient initially wanted to cancel video apt with MD. Encouraged to keep apt to discuss current concerns. He agreed. Reports dizziness when standing and "changing my plane of vision. He is also reporting increased right leg numbness.  "I starting feeling better when I started taking steroids but am starting to feel worse".  Patient encouraged to be cautious changing positions while dizzy.

## 2022-01-09 ENCOUNTER — Inpatient Hospital Stay (HOSPITAL_BASED_OUTPATIENT_CLINIC_OR_DEPARTMENT_OTHER): Payer: Medicare Other | Admitting: Internal Medicine

## 2022-01-09 ENCOUNTER — Ambulatory Visit: Payer: Medicare Other | Admitting: Physical Therapy

## 2022-01-09 DIAGNOSIS — D420 Neoplasm of uncertain behavior of cerebral meninges: Secondary | ICD-10-CM | POA: Diagnosis not present

## 2022-01-09 DIAGNOSIS — R569 Unspecified convulsions: Secondary | ICD-10-CM | POA: Diagnosis not present

## 2022-01-09 MED ORDER — DEXAMETHASONE 2 MG PO TABS: ORAL_TABLET | ORAL | 2 refills | Status: DC

## 2022-01-09 NOTE — Progress Notes (Signed)
I connected with James Olsen on 01/09/22 at 12:00 PM EST by telephone visit and verified that I am speaking with the correct person using two identifiers.  I discussed the limitations, risks, security and privacy concerns of performing an evaluation and management service by telemedicine and the availability of in-person appointments. I also discussed with the patient that there may be a patient responsible charge related to this service. The patient expressed understanding and agreed to proceed.  Other persons participating in the visit and their role in the encounter:  n/a  Patient's location:  Home  Provider's location:  Office  Chief Complaint:  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James Olsen)  Focal seizures (James Olsen)  History of Present Ilness: James Olsen describes clear improvement in right leg weakness and mental clarity since starting the decadron.  He continues to ambulate with a rolling walker.  In the past several days, after dropping the dose to 4mg  daily, his symptoms have recurred, though not fully.  Observations: Language and cognition at baseline   Assessment and Plan: 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James Olsen)  Focal seizures (James Olsen)  James Olsen is clinically improved, but was better on 8mg  decadron.  We recommended increasing decadron to 4mg /2mg  if tolerated.  We will touch base with him via phone again in 2 weeks.    He is agreeable with this plan.  [name], MD   I provided 15 minutes of non face-to-face telephone visit time during this encounter, and > 50% was spent counseling as documented under my assessment & plan.

## 2022-01-10 ENCOUNTER — Telehealth: Payer: Self-pay | Admitting: Internal Medicine

## 2022-01-10 NOTE — Progress Notes (Signed)
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  The most superficial disease received approximately 25 Gy rather than 30 Gy.  Narrative: The patient tolerated radiation therapy relatively well.   Plan: The patient will follow-up with radiation oncology in 61mo.  -----------------------------------  Eppie Gibson, MD

## 2022-01-10 NOTE — Telephone Encounter (Signed)
Scheduled per 1/19 los, pt has been called and confirmed

## 2022-01-13 ENCOUNTER — Other Ambulatory Visit: Payer: Self-pay

## 2022-01-13 ENCOUNTER — Ambulatory Visit: Payer: Medicare Other | Admitting: Physical Therapy

## 2022-01-13 DIAGNOSIS — M6281 Muscle weakness (generalized): Secondary | ICD-10-CM

## 2022-01-13 DIAGNOSIS — M21371 Foot drop, right foot: Secondary | ICD-10-CM

## 2022-01-13 DIAGNOSIS — R2681 Unsteadiness on feet: Secondary | ICD-10-CM

## 2022-01-13 DIAGNOSIS — R2689 Other abnormalities of gait and mobility: Secondary | ICD-10-CM

## 2022-01-13 NOTE — Therapy (Signed)
[facility] [address] [place], [place], [address] Phone: [phone]   Fax:  [phone]  Physical Therapy Treatment  Patient Details  Name: James Olsen MRN: [id number] Date of Birth: 01-13-1946 Referring Provider (PT): Meredith Staggers, MD   Encounter Date: 01/13/2022   PT End of Session - 01/13/22 1424     Visit Number 25    Number of Visits 33    Date for PT Re-Evaluation 01/30/22    Authorization Type Medicare and [place] - 10th visit PN    Progress Note Due on Visit 30    PT Start Time 1315    PT Stop Time 1400    PT Time Calculation (min) 45 min    Activity Tolerance Patient tolerated treatment well    Behavior During Therapy [facility] for tasks assessed/performed             Past Medical History:  Diagnosis Date   Allergy    Arthritis    Cataract    GERD (gastroesophageal reflux disease)    Glaucoma    pt denies.    History of blood transfusion     5 units after previous surgery   History of radiation therapy 12/09/18- 01/24/2019   Left sagittal brain, 11.8 Gy in 31 fractions for a total dose of 55.8 Gy   Hyperlipidemia    Macular degeneration    Seizures ([place])    Stroke [facility])    after surgery in August 2019 mostly recovered still some weakness in left arm   Wears glasses     Past Surgical History:  Procedure Laterality Date   APPLICATION OF CRANIAL NAVIGATION N/A 06/21/2021   Procedure: APPLICATION OF CRANIAL NAVIGATION;  Surgeon: [name], MD;  Location: [place];  Service: Neurosurgery;  Laterality: N/A;   BRAIN SURGERY     CHOLECYSTECTOMY  2008   lap choli   COLONOSCOPY     CRANIOPLASTY N/A 10/29/2018   Procedure: CRANIOPLASTY;  Surgeon: [name], MD;  Location: [place];  Service: Neurosurgery;  Laterality: N/A;  CRANIOPLASTY   CRANIOTOMY N/A 08/18/2018   Procedure: CRANIOTOMY TUMOR EXCISION;  Surgeon: [name], MD;  Location: [place];  Service: Neurosurgery;  Laterality: N/A;   CRANIOTOMY TUMOR EXCISION   CRANIOTOMY Bilateral 06/21/2021   Procedure: Bilateral Craniotomy for tumor resection;  Surgeon: [name], MD;  Location: [place];  Service: Neurosurgery;  Laterality: Bilateral;   DIAGNOSTIC LAPAROSCOPY     EYE SURGERY     laser surgery to relieve pressure   NAILBED REPAIR Left 05/04/2014   Procedure: LEFT INDEX NAIL ABLATION V-Y FLAP COVERAGE;  Surgeon: [name]., MD;  Location: [place];  Service: Orthopedics;  Laterality: Left;  index   PILONIDAL CYST EXCISION     x2   TONSILLECTOMY     VASECTOMY      There were no vitals filed for this visit.   Subjective Assessment - 01/13/22 1312     Subjective Feeling better today; wasn't able to come second visit last week due to onset of increased dizziness after weaning down on steroid by 2mg .  Per MD recommendation, went back up on steroid and dizziness has improved today.  No falls or near falls but had to be extra careful when moving around.    Pertinent History OA, h/o blood transfusion, h/o radiation therapy, macular degeneration, CVA, craniotomy > cranioplasty and resection of meningioma, R hemiparesis, focal seizures, urinary retention    Limitations Standing;Walking;House  hold activities    Patient Stated Goals Walking, gain more strength in RLE to be able to drive in 6 months if seizure free    Currently in Pain? No/denies              [facility] Adult PT Treatment/Exercise - 01/13/22 1326       Knee/Hip Exercises: Aerobic   Tread Mill Forwards with incline at 3% x 5 minutes at 0.7 mph with therapist providing facilitation of lateral and anterior weight shifting and cues for increased stance time on RLE and increased step length LLE.  Changed to backwards walking, 0% incline, 0.3 mph x 2 minute to focus on hamstring and hip extension activation, required max A to advance RLE backwards and cues for increased stance on RLE, increased step length LLE.      Knee/Hip Exercises:  Supine   Other Supine Knee/Hip Exercises Hamstring training: RLE single leg isometric hamstring activation with knee flexed; added in single leg hamstring bridge on RLE.  During single leg bridge PT gave verbal cues to curl heel in to increase HS activation and decrease quad activation/pushing x 10 reps.  Changed to bilat bridges but then scooting hips down closer to feet to continue to encourage knee flexion during hip extension x 8 reps.  Changed back to single leg bridge on LLE and performing open chain R hip and knee flexion while maintaining hips off mat x 8 reps and then switched to other LE x 8 reps.               PT Education - 01/13/22 1407     Education Details information about how to obtain a new leather toe cap; provided pt with information about [facility] - address and phone number to set up appointment.    Person(s) Educated Patient    Methods Explanation;Handout    Comprehension Verbalized understanding              PT Short Term Goals - 12/01/21 1609       PT SHORT TERM GOAL #1   Title = LTG set at 4 weeks (12/31/21) and then can be set again at 8 weeks (01/30/22)    Target Date 01/30/22               PT Long Term Goals - 12/30/21 1242       PT LONG TERM GOAL #1   Title Pt will demonstrate independence with final HEP    Baseline 1/9: was not able to perform consistently while on hold, will review next session    Time 4    Period Weeks    Status Revised    Target Date 01/30/22      PT LONG TERM GOAL #2   Title Pt will increase gait velocity with AFO and cane to >/= 2.62 ft/sec without LOB    Baseline 2.5 with RW; 1.9 ft/sec with cane and min A    Time 4    Period Weeks    Status On-going    Target Date 01/30/22      PT LONG TERM GOAL #3   Title Pt will increase score on Mini-Best test by 4 points to indicate decreased falls risk    Baseline 1/9: score unchanged 16/28    Time 4    Period Weeks    Status On-going    Target Date 01/30/22       PT LONG TERM GOAL #4   Title Pt will ambulate x 250'  outside over uneven pavement, negotiate ramp, curb and 12 stairs with AFO and CANE, MOD I.    Time 4    Period Weeks    Status On-going    Target Date 01/30/22                   Plan - 01/13/22 1559     Clinical Impression Statement Pt reporting improvement in dizziness/foggy feeling with activity today.  Pt demonstrated improved endurance/tolerance for gait training on treadmill but continues to demonstrate significant proximal hamstring and hip flexor weakness.  Began to perform combined exercises in supine to address.  Pt may benefit from adding these exercises to HEP.    Comorbidities OA, h/o blood transfusion, h/o radiation therapy, macular degeneration, CVA, craniotomy > cranioplasty and resection of meningioma, R hemiparesis, focal seizures, urinary retention    Rehab Potential Good    PT Frequency 2x / week    PT Duration 8 weeks    PT Treatment/Interventions ADLs/Self Care Home Management;Aquatic Therapy;DME Instruction;Gait training;Stair training;Functional mobility training;Therapeutic activities;Therapeutic exercise;Balance training;Neuromuscular re-education;Patient/family education;Passive range of motion    PT Next Visit Plan No estim due to CA and seizures. Update HEP: on floor add R hamstring exercises in supine.  Continue to work on safe gait with cane with quad tip (indoors and outdoors), obstacles, stairs, curb, ramp, treamill-inclined and retro; stepping strategy and other fast balance reactions. as able continue high intensity circuit and gait training (HR to stay within 102-132-can do any functional task, if becoming easy, weight him, add speed, etc).  NMR for RLE-tall kneeling for hamstring.    PT Home Exercise Plan Access Code: [email or web address]    Consulted and Agree with Plan of Care Patient             Patient will benefit from skilled therapeutic intervention in order to improve the following deficits and  impairments:  Abnormal gait, Decreased activity tolerance, Decreased balance, Decreased strength, Difficulty walking  Visit Diagnosis: Unsteadiness on feet  Muscle weakness (generalized)  Foot drop, right  Other abnormalities of gait and mobility     Problem List Patient Active Problem List   Diagnosis Date Noted   Spasticity 09/11/2021   Urinary retention 07/17/2021   S/P resection of meningioma 06/26/2021   S/P craniotomy 06/21/2021   Focal seizures ([place]) 05/06/2021   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   Other abnormalities of gait and mobility 09/17/2018   Acute cerebral infarction ([facility])    Thrombocytopenia ([facility])    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 [place], [place], [address] Phone: [phone]   Fax:  [phone]  Name: James Olsen MRN: [id number] Date of Birth: [birth year]

## 2022-01-17 ENCOUNTER — Ambulatory Visit: Payer: Medicare Other | Admitting: Physical Therapy

## 2022-01-17 ENCOUNTER — Encounter: Payer: Self-pay | Admitting: Physical Therapy

## 2022-01-17 ENCOUNTER — Encounter: Payer: Self-pay | Admitting: Internal Medicine

## 2022-01-17 ENCOUNTER — Other Ambulatory Visit: Payer: Self-pay

## 2022-01-17 DIAGNOSIS — R2681 Unsteadiness on feet: Secondary | ICD-10-CM

## 2022-01-17 DIAGNOSIS — M21371 Foot drop, right foot: Secondary | ICD-10-CM

## 2022-01-17 DIAGNOSIS — R2689 Other abnormalities of gait and mobility: Secondary | ICD-10-CM

## 2022-01-17 DIAGNOSIS — M6281 Muscle weakness (generalized): Secondary | ICD-10-CM

## 2022-01-17 NOTE — Therapy (Signed)
[facility] [address] [place], [place], [address] Phone: [phone]   Fax:  [phone]  Physical Therapy Treatment  Patient Details  Name: James Olsen MRN: [id number] Date of Birth: 29-May-1946 Referring Provider (PT): Meredith Staggers, MD   Encounter Date: 01/17/2022   PT End of Session - 01/17/22 1320     Visit Number 26    Number of Visits 33    Date for PT Re-Evaluation 01/30/22    Authorization Type Medicare and [place] - 10th visit PN    Progress Note Due on Visit 30    PT Start Time 1317    PT Stop Time 1358    PT Time Calculation (min) 41 min    Equipment Utilized During Treatment Gait belt   right AFO   Activity Tolerance Patient tolerated treatment well    Behavior During Therapy WFL for tasks assessed/performed             Past Medical History:  Diagnosis Date   Allergy    Arthritis    Cataract    GERD (gastroesophageal reflux disease)    Glaucoma    pt denies.    History of blood transfusion     5 units after previous surgery   History of radiation therapy 12/09/18- 01/24/2019   Left sagittal brain, 11.8 Gy in 31 fractions for a total dose of 55.8 Gy   Hyperlipidemia    Macular degeneration    Seizures ([place])    Stroke [facility])    after surgery in August 2019 mostly recovered still some weakness in left arm   Wears glasses     Past Surgical History:  Procedure Laterality Date   APPLICATION OF CRANIAL NAVIGATION N/A 06/21/2021   Procedure: APPLICATION OF CRANIAL NAVIGATION;  Surgeon: [name], MD;  Location: [place];  Service: Neurosurgery;  Laterality: N/A;   BRAIN SURGERY     CHOLECYSTECTOMY  2008   lap choli   COLONOSCOPY     CRANIOPLASTY N/A 10/29/2018   Procedure: CRANIOPLASTY;  Surgeon: [name], MD;  Location: [place];  Service: Neurosurgery;  Laterality: N/A;  CRANIOPLASTY   CRANIOTOMY N/A 08/18/2018   Procedure: CRANIOTOMY TUMOR EXCISION;  Surgeon: [name],  MD;  Location: [place];  Service: Neurosurgery;  Laterality: N/A;  CRANIOTOMY TUMOR EXCISION   CRANIOTOMY Bilateral 06/21/2021   Procedure: Bilateral Craniotomy for tumor resection;  Surgeon: [name], MD;  Location: [place];  Service: Neurosurgery;  Laterality: Bilateral;   DIAGNOSTIC LAPAROSCOPY     EYE SURGERY     laser surgery to relieve pressure   NAILBED REPAIR Left 05/04/2014   Procedure: LEFT INDEX NAIL ABLATION V-Y FLAP COVERAGE;  Surgeon: [name]., MD;  Location: [place];  Service: Orthopedics;  Laterality: Left;  index   PILONIDAL CYST EXCISION     x2   TONSILLECTOMY     VASECTOMY      There were no vitals filed for this visit.   Subjective Assessment - 01/17/22 1319     Subjective Having increased dizziness/spinning today than he did the other day. Has sent message to Dr. Mickeal Skinner, has not heard back from the doctor. No falls.    Pertinent History OA, h/o blood transfusion, h/o radiation therapy, macular degeneration, CVA, craniotomy > cranioplasty and resection of meningioma, R hemiparesis, focal seizures, urinary retention    Limitations Standing;Walking;House hold activities    Patient Stated Goals Walking, gain more strength in RLE to be  able to drive in 6 months if seizure free    Currently in Pain? No/denies                     [facility] Adult PT Treatment/Exercise - 01/17/22 1321       Transfers   Transfers Sit to Stand;Stand to Sit    Sit to Stand 5: Supervision;4: Min guard;With upper extremity assist;From bed;From chair/3-in-1    Stand to Sit 5: Supervision;With upper extremity assist;To bed;To chair/3-in-1      Ambulation/Gait   Ambulation/Gait Yes    Ambulation/Gait Assistance 5: Supervision;4: Min guard;4: Min assist    Ambulation/Gait Assistance Details supervision with RW to enter/exit session. use of cane with min guard to min HHA for short distances in session. decreased right LE hip flexion/foot clearance noted,  increased with second bout of gait after use of Scifit.    Ambulation Distance (Feet) 20 Feet   x2 with cane   Assistive device Straight cane;Rolling walker   cane with rubber quad tip   Gait Pattern Step-through pattern;Decreased step length - right;Decreased arm swing - right;Decreased stride length;Decreased hip/knee flexion - right;Decreased dorsiflexion - right;Poor foot clearance - right    Ambulation Surface Level;Indoor      Knee/Hip Exercises: Aerobic   Stepper Scifit stepper at level 4.5 x 8 minutes with goal >/= 80 steps per minute for strengthening and activity tolerance.      Knee/Hip Exercises: Supine   Bridges Limitations 5 sec hold each rep with right single leg bridge (left LE out in extension on mat table).    Single Leg Bridge AROM;Strengthening;Right;1 set;10 reps;Limitations    Straight Leg Raises AROM;Strengthening;Right;1 set;10 reps;Limitations    Straight Leg Raises Limitations had pt hold at ~height of other knee (left LE bent) for 3 seconds, then emphasized slow and controlled lowering back to mat table with each rep.    Other Supine Knee/Hip Exercises hook lying at edge of mat: lowering right foot to floor<>back onto mat for 10 reps with assistance at time to keep brace from catching on edge of mat    Other Supine Knee/Hip Exercises with red pball: feet on ball for hamstring curls x 10 reps with assist to keep feet on ball; with ball under bil calves- bridge with alternating lifting leg off ball/back on ball with each rep x 10 reps, assist for ball stability with arms on mat at sides; with ball under knees working on lower trunk rotation left<>right with emphasis on rolling ball for knees to go as low as possible with control and on slow movements for 10 reps each way.                       PT Short Term Goals - 12/01/21 1609       PT SHORT TERM GOAL #1   Title = LTG set at 4 weeks (12/31/21) and then can be set again at 8 weeks (01/30/22)    Target Date  01/30/22               PT Long Term Goals - 12/30/21 1242       PT LONG TERM GOAL #1   Title Pt will demonstrate independence with final HEP    Baseline 1/9: was not able to perform consistently while on hold, will review next session    Time 4    Period Weeks    Status Revised    Target Date 01/30/22  PT LONG TERM GOAL #2   Title Pt will increase gait velocity with AFO and cane to >/= 2.62 ft/sec without LOB    Baseline 2.5 with RW; 1.9 ft/sec with cane and min A    Time 4    Period Weeks    Status On-going    Target Date 01/30/22      PT LONG TERM GOAL #3   Title Pt will increase score on Mini-Best test by 4 points to indicate decreased falls risk    Baseline 1/9: score unchanged 16/28    Time 4    Period Weeks    Status On-going    Target Date 01/30/22      PT LONG TERM GOAL #4   Title Pt will ambulate x 250' outside over uneven pavement, negotiate ramp, curb and 12 stairs with AFO and CANE, MOD I.    Time 4    Period Weeks    Status On-going    Target Date 01/30/22                   Plan - 01/17/22 1321     Clinical Impression Statement Today's skilled session focused more on strengthening than balance due to pt with increased dizziness/spinning today vs previous session this week. No increase reported with session, no other issues noted or reported in session. Pt did need increased assistance for balance with use of cane today as he was having mild tremors. Pt reports this happens when he gets the dizziness. His MD is aware and is monitoring/addressing as able. The pt should benefit from continued PT to progress toward unmet goals.    Personal Factors and Comorbidities Comorbidity 3+;Past/Current Experience;Social Background;Transportation    Comorbidities OA, h/o blood transfusion, h/o radiation therapy, macular degeneration, CVA, craniotomy > cranioplasty and resection of meningioma, R hemiparesis, focal seizures, urinary retention    Rehab  Potential Good    PT Frequency 2x / week    PT Duration 8 weeks    PT Treatment/Interventions ADLs/Self Care Home Management;Aquatic Therapy;DME Instruction;Gait training;Stair training;Functional mobility training;Therapeutic activities;Therapeutic exercise;Balance training;Neuromuscular re-education;Patient/family education;Passive range of motion    PT Next Visit Plan No estim due to CA and seizures. Pt has 2 weeks left in cert (ends 03/25/30)-[id number] to discuss plan for recert vs break/discharge. if recert go ahead and get on schedule. continue to address gait with cane, LE strengthening with emphasis on hamstring ex's, treadmil working on inclines/declines and retro gait and balance reaction training    PT Home Exercise Plan Access Code: [email or web address]    Consulted and Agree with Plan of Care Patient             Patient will benefit from skilled therapeutic intervention in order to improve the following deficits and impairments:  Abnormal gait, Decreased activity tolerance, Decreased balance, Decreased strength, Difficulty walking  Visit Diagnosis: Unsteadiness on feet  Muscle weakness (generalized)  Foot drop, right  Other abnormalities of gait and mobility     Problem List Patient Active Problem List   Diagnosis Date Noted   Spasticity 09/11/2021   Urinary retention 07/17/2021   S/P resection of meningioma 06/26/2021   S/P craniotomy 06/21/2021   Focal seizures ([place]) 05/06/2021   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   Other abnormalities of gait and mobility 09/17/2018   Acute cerebral infarction (HCC)    Thrombocytopenia ([facility])    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 ([place])    Acute blood loss anemia  Generalized OA    Gastroesophageal reflux disease    Seizure prophylaxis    Leukocytosis    Tumor 08/18/2018   MACULAR DEGENERATION 02/29/2008   ALLERGY 02/29/2008   Nonspecific (abnormal) findings on radiological and other  examination of body structure 12/26/2007   NONSPECIFIC ABNORM FIND RAD&OTH EXAM LUNG FIELD 12/26/2007   GALLSTONES 10/01/2007   ELEVATION, TRANSAMINASE/LDH LEVELS 09/06/2007   HEPATITIS NOS 09/01/2007   ABDOMINAL PAIN, EPIGASTRIC 09/01/2007    Willow Ora, PTA, [facility] [address] [place], [place] [address] [phone] 01/17/22, 3:29 PM   Name: James Olsen MRN: [id number] Date of Birth: 03/08/1946

## 2022-01-20 ENCOUNTER — Other Ambulatory Visit: Payer: Self-pay

## 2022-01-20 ENCOUNTER — Ambulatory Visit: Payer: Medicare Other | Admitting: Physical Therapy

## 2022-01-20 ENCOUNTER — Encounter: Payer: Self-pay | Admitting: Internal Medicine

## 2022-01-20 DIAGNOSIS — R2681 Unsteadiness on feet: Secondary | ICD-10-CM | POA: Diagnosis not present

## 2022-01-20 DIAGNOSIS — M6281 Muscle weakness (generalized): Secondary | ICD-10-CM

## 2022-01-20 DIAGNOSIS — M21371 Foot drop, right foot: Secondary | ICD-10-CM

## 2022-01-20 DIAGNOSIS — R2689 Other abnormalities of gait and mobility: Secondary | ICD-10-CM

## 2022-01-20 NOTE — Therapy (Signed)
[facility] [address] [place], [place], [address] Phone: [phone]   Fax:  [phone]  Physical Therapy Treatment  Patient Details  Name: James Olsen MRN: [id number] Date of Birth: 1946-07-05 Referring Provider (PT): Meredith Staggers, MD   Encounter Date: 01/20/2022   PT End of Session - 01/20/22 1521     Visit Number 27    Number of Visits 33    Date for PT Re-Evaluation 01/30/22    Authorization Type Medicare and [place] - 10th visit PN    Progress Note Due on Visit 30    PT Start Time 1320    PT Stop Time 1403    PT Time Calculation (min) 43 min    Equipment Utilized During Treatment Gait belt;Other (comment)   right AFO   Activity Tolerance Patient tolerated treatment well    Behavior During Therapy WFL for tasks assessed/performed             Past Medical History:  Diagnosis Date   Allergy    Arthritis    Cataract    GERD (gastroesophageal reflux disease)    Glaucoma    pt denies.    History of blood transfusion     5 units after previous surgery   History of radiation therapy 12/09/18- 01/24/2019   Left sagittal brain, 11.8 Gy in 31 fractions for a total dose of 55.8 Gy   Hyperlipidemia    Macular degeneration    Seizures ([place])    Stroke [facility])    after surgery in August 2019 mostly recovered still some weakness in left arm   Wears glasses     Past Surgical History:  Procedure Laterality Date   APPLICATION OF CRANIAL NAVIGATION N/A 06/21/2021   Procedure: APPLICATION OF CRANIAL NAVIGATION;  Surgeon: [name], MD;  Location: [facility];  Service: Neurosurgery;  Laterality: N/A;   BRAIN SURGERY     CHOLECYSTECTOMY  2008   lap choli   COLONOSCOPY     CRANIOPLASTY N/A 10/29/2018   Procedure: CRANIOPLASTY;  Surgeon: [name], MD;  Location: [facility];  Service: Neurosurgery;  Laterality: N/A;  CRANIOPLASTY   CRANIOTOMY N/A 08/18/2018   Procedure: CRANIOTOMY TUMOR EXCISION;  Surgeon:  [name], MD;  Location: [facility];  Service: Neurosurgery;  Laterality: N/A;  CRANIOTOMY TUMOR EXCISION   CRANIOTOMY Bilateral 06/21/2021   Procedure: Bilateral Craniotomy for tumor resection;  Surgeon: [name], MD;  Location: [facility] [place];  Service: Neurosurgery;  Laterality: Bilateral;   DIAGNOSTIC LAPAROSCOPY     EYE SURGERY     laser surgery to relieve pressure   NAILBED REPAIR Left 05/04/2014   Procedure: LEFT INDEX NAIL ABLATION V-Y FLAP COVERAGE;  Surgeon: [name]., MD;  Location: [facility];  Service: Orthopedics;  Laterality: Left;  index   PILONIDAL CYST EXCISION     x2   TONSILLECTOMY     VASECTOMY      There were no vitals filed for this visit.   Subjective Assessment - 01/20/22 1322     Subjective Pt has been taking in more fluids and feels like that may be helping the dizziness.    Pertinent History OA, h/o blood transfusion, h/o radiation therapy, macular degeneration, CVA, craniotomy > cranioplasty and resection of meningioma, R hemiparesis, focal seizures, urinary retention    Limitations Standing;Walking;House hold activities    Patient Stated Goals Walking, gain more strength in RLE to be able to drive in 6 months if seizure  free    Currently in Pain? No/denies               Vestibular Assessment - 01/20/22 1325       Orthostatics   BP supine (x 5 minutes) 123/75   no dizziness in supine   HR supine (x 5 minutes) 59    BP sitting 115/75   mild lightheadedness   HR sitting 64    BP standing (after 1 minute) 110/67   no symptoms during first minute of standing   HR standing (after 1 minute) 62    BP standing (after 3 minutes) 118/75   no symptoms after 3 min standing   HR standing (after 3 minutes) 63                [facility] Adult PT Treatment/Exercise - 01/20/22 1355       Ambulation/Gait   Ambulation/Gait Yes    Ambulation/Gait Assistance 4: Min assist    Ambulation/Gait Assistance Details when walking forwards in  straight path pt able to anticipate R foot drag and able to prevent anterior LOB by stopping gait.  When changing directions or making turns pt less able to prevent forward LOB and required increased assistance from PT.  Pt has not had leather toe cap replaced yet; plans to drop it off at [facility] the week after next for them to repair.    Ambulation Distance (Feet) 230 Feet    Assistive device Straight cane   quad tip   Gait Pattern Step-through pattern;Decreased step length - right;Decreased arm swing - right;Decreased stride length;Decreased hip/knee flexion - right;Decreased dorsiflexion - right;Poor foot clearance - right    Ambulation Surface Level;Indoor      Exercises   Exercises Knee/Hip      Knee/Hip Exercises: Standing   Forward Lunges Right;Left;1 set;10 reps    Forward Lunges Limitations with bilat UE support    Functional Squat 1 set;10 reps    Functional Squat Limitations with bilat UE support    SLS with Vectors SLS on RLE while performing stance LE/closed chain hip IR and ER with LLE taps across midline and then out to ipsilateral side x 10 reps; bilat UE support              PT Education - 01/20/22 1520     Education Details plan for more visits and decreasing to 1x/week after wife has surgery    Person(s) Educated Patient    Methods Explanation    Comprehension Verbalized understanding              PT Short Term Goals - 12/01/21 1609       PT SHORT TERM GOAL #1   Title = LTG set at 4 weeks (12/31/21) and then can be set again at 8 weeks (01/30/22)    Target Date 01/30/22               PT Long Term Goals - 12/30/21 1242       PT LONG TERM GOAL #1   Title Pt will demonstrate independence with final HEP    Baseline 1/9: was not able to perform consistently while on hold, will review next session    Time 4    Period Weeks    Status Revised    Target Date 01/30/22      PT LONG TERM GOAL #2   Title Pt will increase gait velocity with AFO and cane  to >/= 2.62 ft/sec without LOB  Baseline 2.5 with RW; 1.9 ft/sec with cane and min A    Time 4    Period Weeks    Status On-going    Target Date 01/30/22      PT LONG TERM GOAL #3   Title Pt will increase score on Mini-Best test by 4 points to indicate decreased falls risk    Baseline 1/9: score unchanged 16/28    Time 4    Period Weeks    Status On-going    Target Date 01/30/22      PT LONG TERM GOAL #4   Title Pt will ambulate x 250' outside over uneven pavement, negotiate ramp, curb and 12 stairs with AFO and CANE, MOD I.    Time 4    Period Weeks    Status On-going    Target Date 01/30/22                   Plan - 01/20/22 1521     Clinical Impression Statement Pt is reporting improvement in dizziness today and feels it may be related to hydration.  Performed baseline orthostatic assessement today with pt reporting mild symptoms of lightheadedness with supine > sit but no significant drop in systolic BP with transitions from supine > sit > stand.  BP was able to recover to baseline after prolonged standing.  If symptoms return, will recheck orthostatic BP to determine if this is a factor in dizziness.  Continued to perform dynamic LE strengthening and gait training with cane; pt continues to demonstrate greatest instability when making turns/changing direction.  Will continue to address and progress towards LTG.    Personal Factors and Comorbidities Comorbidity 3+;Past/Current Experience;Social Background;Transportation    Comorbidities OA, h/o blood transfusion, h/o radiation therapy, macular degeneration, CVA, craniotomy > cranioplasty and resection of meningioma, R hemiparesis, focal seizures, urinary retention    Rehab Potential Good    PT Frequency 2x / week    PT Duration 8 weeks    PT Treatment/Interventions ADLs/Self Care Home Management;Aquatic Therapy;DME Instruction;Gait training;Stair training;Functional mobility training;Therapeutic activities;Therapeutic  exercise;Balance training;Neuromuscular re-education;Patient/family education;Passive range of motion    PT Next Visit Plan No estim due to CA and seizures. I scheduled some appointments - please review schedule and make sure these days/times work for him.  Continue to address gait with cane - especially turns and use of stepping strategy if R toe drags, LE strengthening with emphasis on hamstring ex's, treadmil working on inclines/declines and retro gait and balance reaction training, SLS on RLE    PT Home Exercise Plan Access Code: [email or web address]    Consulted and Agree with Plan of Care Patient             Patient will benefit from skilled therapeutic intervention in order to improve the following deficits and impairments:  Abnormal gait, Decreased activity tolerance, Decreased balance, Decreased strength, Difficulty walking  Visit Diagnosis: Unsteadiness on feet  Muscle weakness (generalized)  Foot drop, right  Other abnormalities of gait and mobility     Problem List Patient Active Problem List   Diagnosis Date Noted   Spasticity 09/11/2021   Urinary retention 07/17/2021   S/P resection of meningioma 06/26/2021   S/P craniotomy 06/21/2021   Focal seizures ([place]) 05/06/2021   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   Other abnormalities of gait and mobility 09/17/2018   Acute cerebral infarction ([facility])    Thrombocytopenia ([facility])    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 [place], [place], [address] Phone: [phone]   Fax:  [phone]  Name: DEMORIO SEELEY MRN: [id number] Date of Birth: 21-Dec-1946

## 2022-01-23 ENCOUNTER — Inpatient Hospital Stay: Payer: Medicare Other | Attending: Internal Medicine | Admitting: Internal Medicine

## 2022-01-23 ENCOUNTER — Other Ambulatory Visit: Payer: Self-pay

## 2022-01-23 ENCOUNTER — Ambulatory Visit: Payer: Medicare Other | Attending: Physical Medicine & Rehabilitation | Admitting: Physical Therapy

## 2022-01-23 ENCOUNTER — Encounter: Payer: Self-pay | Admitting: Physical Therapy

## 2022-01-23 DIAGNOSIS — M6281 Muscle weakness (generalized): Secondary | ICD-10-CM | POA: Diagnosis present

## 2022-01-23 DIAGNOSIS — R2689 Other abnormalities of gait and mobility: Secondary | ICD-10-CM | POA: Diagnosis present

## 2022-01-23 DIAGNOSIS — R569 Unspecified convulsions: Secondary | ICD-10-CM

## 2022-01-23 DIAGNOSIS — D420 Neoplasm of uncertain behavior of cerebral meninges: Secondary | ICD-10-CM

## 2022-01-23 DIAGNOSIS — M21371 Foot drop, right foot: Secondary | ICD-10-CM | POA: Insufficient documentation

## 2022-01-23 DIAGNOSIS — R2681 Unsteadiness on feet: Secondary | ICD-10-CM | POA: Diagnosis not present

## 2022-01-23 NOTE — Progress Notes (Signed)
I connected with [name] on 01/23/22 at 12:00 PM EST by telephone visit and verified that I am speaking with the correct person using two identifiers.  I discussed the limitations, risks, security and privacy concerns of performing an evaluation and management service by telemedicine and the availability of in-person appointments. I also discussed with the patient that there may be a patient responsible charge related to this service. The patient expressed understanding and agreed to proceed.  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  He continues to ambulate with a rolling walker.  In the past several days, after dropping the dose to 4mg  daily, his symptoms have recurred, though not fully.  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  We recommended increasing decadron to 4mg /2mg  if tolerated.  We will touch base with him via phone again in 2 weeks.    He is agreeable with this plan.  [name], MD   I provided 15 minutes of non face-to-face telephone visit time during this encounter, and > 50% was spent counseling as documented under my assessment & plan.

## 2022-01-23 NOTE — Therapy (Signed)
[facility] [address] [place], [place], [address] Phone: [phone]   Fax:  [phone]  Physical Therapy Treatment  Patient Details  Name: James Olsen MRN: [id number] Date of Birth: [birth year] Referring Provider (PT): Meredith Staggers, MD   Encounter Date: 01/23/2022   PT End of Session - 01/23/22 1802     Visit Number 28    Number of Visits 33    Date for PT Re-Evaluation 01/30/22    Authorization Type Medicare and [place] - 10th visit PN    Progress Note Due on Visit 30    PT Start Time 1318    PT Stop Time 1400    PT Time Calculation (min) 42 min    Equipment Utilized During Treatment Gait belt;Other (comment)   right AFO   Activity Tolerance Patient tolerated treatment well    Behavior During Therapy WFL for tasks assessed/performed             Past Medical History:  Diagnosis Date   Allergy    Arthritis    Cataract    GERD (gastroesophageal reflux disease)    Glaucoma    pt denies.    History of blood transfusion     5 units after previous surgery   History of radiation therapy 12/09/18- 01/24/2019   Left sagittal brain, 11.8 Gy in 31 fractions for a total dose of 55.8 Gy   Hyperlipidemia    Macular degeneration    Seizures ([place])    Stroke [facility])    after surgery in August 2019 mostly recovered still some weakness in left arm   Wears glasses     Past Surgical History:  Procedure Laterality Date   APPLICATION OF CRANIAL NAVIGATION N/A 06/21/2021   Procedure: APPLICATION OF CRANIAL NAVIGATION;  Surgeon: [name], MD;  Location: [facility];  Service: Neurosurgery;  Laterality: N/A;   BRAIN SURGERY     CHOLECYSTECTOMY  2008   lap choli   COLONOSCOPY     CRANIOPLASTY N/A 10/29/2018   Procedure: CRANIOPLASTY;  Surgeon: [name], MD;  Location: [facility];  Service: Neurosurgery;  Laterality: N/A;  CRANIOPLASTY   CRANIOTOMY N/A 08/18/2018   Procedure: CRANIOTOMY TUMOR EXCISION;  Surgeon:  [name], MD;  Location: [facility];  Service: Neurosurgery;  Laterality: N/A;  CRANIOTOMY TUMOR EXCISION   CRANIOTOMY Bilateral 06/21/2021   Procedure: Bilateral Craniotomy for tumor resection;  Surgeon: [name], MD;  Location: [facility];  Service: Neurosurgery;  Laterality: Bilateral;   DIAGNOSTIC LAPAROSCOPY     EYE SURGERY     laser surgery to relieve pressure   NAILBED REPAIR Left 05/04/2014   Procedure: LEFT INDEX NAIL ABLATION V-Y FLAP COVERAGE;  Surgeon: [name]., MD;  Location: [facility];  Service: Orthopedics;  Laterality: Left;  index   PILONIDAL CYST EXCISION     x2   TONSILLECTOMY     VASECTOMY      There were no vitals filed for this visit.   Subjective Assessment - 01/23/22 1319     Subjective Down to 6mg  total per day of the steroids and has been drinking more fluids to keep hydration up. Will start a taper of the steriods this coming Monday by 1mg   per weeks as symptoms do not reoccur per Dr. Mickeal Skinner. If a return of symtoms occurs he is to call Dr. Mickeal Skinner and go back up. No falls or pain to report.    Pertinent History OA, h/o blood transfusion,  h/o radiation therapy, macular degeneration, CVA, craniotomy > cranioplasty and resection of meningioma, R hemiparesis, focal seizures, urinary retention    Limitations Standing;Walking;House hold activities    Patient Stated Goals Walking, gain more strength in RLE to be able to drive in 6 months if seizure free    Currently in Pain? No/denies                    [facility] Adult PT Treatment/Exercise - 01/23/22 1328       Transfers   Transfers Sit to Stand;Stand to Sit    Sit to Stand 5: Supervision;With upper extremity assist;From chair/3-in-1;From bed    Stand to Sit 5: Supervision;With upper extremity assist;To bed;To chair/3-in-1      Ambulation/Gait   Ambulation/Gait Yes    Ambulation/Gait Assistance 4: Min assist    Ambulation/Gait Assistance Details HHA opposite the cane for balance  with cues for increased step length and upright posture    Ambulation Distance (Feet) 50 Feet   x2   Assistive device Straight cane   with rubber quad tip   Gait Pattern Step-through pattern;Decreased step length - right;Decreased arm swing - right;Decreased stride length;Decreased hip/knee flexion - right;Decreased dorsiflexion - right;Poor foot clearance - right    Ambulation Surface Level;Indoor      Exercises   Exercises Other Exercises    Other Exercises  lying on mat table: with left LE out straight on mat table, right LE bent with heel only on mat- isometric hamstring contaction by pushing heel into mat for 5 sec's x 10 reps, then is same position with bil heels pressing into mat for bridging 5 sec holds x 10 reps; with ball at feet; hamstring curls for 10 reps with assist to keep feet on ball. then with ball under bil calves and arms on mat at sides: bridge then alternating LE lifts off/onto ball for 8 reps. assist to stabilize ball needed.      Knee/Hip Exercises: Aerobic   Stepper Scifit stepper at level 4.5 x 8 minutes with goal >/= 90 steps per minute for strengthening and activity tolerance.                       PT Short Term Goals - 12/01/21 1609       PT SHORT TERM GOAL #1   Title = LTG set at 4 weeks (12/31/21) and then can be set again at 8 weeks (01/30/22)    Target Date 01/30/22               PT Long Term Goals - 12/30/21 1242       PT LONG TERM GOAL #1   Title Pt will demonstrate independence with final HEP    Baseline 1/9: was not able to perform consistently while on hold, will review next session    Time 4    Period Weeks    Status Revised    Target Date 01/30/22      PT LONG TERM GOAL #2   Title Pt will increase gait velocity with AFO and cane to >/= 2.62 ft/sec without LOB    Baseline 2.5 with RW; 1.9 ft/sec with cane and min A    Time 4    Period Weeks    Status On-going    Target Date 01/30/22      PT LONG TERM GOAL #3   Title Pt  will increase score on Mini-Best test by 4 points to indicate decreased falls  risk    Baseline 1/9: score unchanged 16/28    Time 4    Period Weeks    Status On-going    Target Date 01/30/22      PT LONG TERM GOAL #4   Title Pt will ambulate x 250' outside over uneven pavement, negotiate ramp, curb and 12 stairs with AFO and CANE, MOD I.    Time 4    Period Weeks    Status On-going    Target Date 01/30/22                   Plan - 01/23/22 1802     Clinical Impression Statement Continues to report improvement in dizziness with session today. Pt did fatigue signifiantly after use of Scifit with visable LE muscle tremors. Remainder of session focused on gait with cane and right LE/core strengthening with no issues noted or reported. The pt is making steady progress toward goals and should benefit from continued PT to progress toward unmet goals .    Personal Factors and Comorbidities Comorbidity 3+;Past/Current Experience;Social Background;Transportation    Comorbidities OA, h/o blood transfusion, h/o radiation therapy, macular degeneration, CVA, craniotomy > cranioplasty and resection of meningioma, R hemiparesis, focal seizures, urinary retention    Rehab Potential Good    PT Frequency 2x / week    PT Duration 8 weeks    PT Treatment/Interventions ADLs/Self Care Home Management;Aquatic Therapy;DME Instruction;Gait training;Stair training;Functional mobility training;Therapeutic activities;Therapeutic exercise;Balance training;Neuromuscular re-education;Patient/family education;Passive range of motion    PT Next Visit Plan No estim due to CA and seizures.   Continue to address gait with cane - especially turns and use of stepping strategy if R toe drags, LE strengthening with emphasis on hamstring ex's, treadmil working on inclines/declines and retro gait and balance reaction training, SLS on RLE    PT Home Exercise Plan Access Code: [email or web address]    Consulted and Agree with Plan of Care  Patient             Patient will benefit from skilled therapeutic intervention in order to improve the following deficits and impairments:  Abnormal gait, Decreased activity tolerance, Decreased balance, Decreased strength, Difficulty walking  Visit Diagnosis: Unsteadiness on feet  Muscle weakness (generalized)  Foot drop, right  Other abnormalities of gait and mobility     Problem List Patient Active Problem List   Diagnosis Date Noted   Spasticity 09/11/2021   Urinary retention 07/17/2021   S/P resection of meningioma 06/26/2021   S/P craniotomy 06/21/2021   Focal seizures ([place]) 05/06/2021   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   Other abnormalities of gait and mobility 09/17/2018   Acute cerebral infarction ([place])    Thrombocytopenia ([place])    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 ([place])    Acute blood loss anemia    Generalized OA    Gastroesophageal reflux disease    Seizure prophylaxis    Leukocytosis    Tumor 08/18/2018   MACULAR DEGENERATION 02/29/2008   ALLERGY 02/29/2008   Nonspecific (abnormal) findings on radiological and other examination of body structure 12/26/2007   NONSPECIFIC ABNORM FIND RAD&OTH EXAM LUNG FIELD 12/26/2007   GALLSTONES 10/01/2007   ELEVATION, TRANSAMINASE/LDH LEVELS 09/06/2007   HEPATITIS NOS 09/01/2007   ABDOMINAL PAIN, EPIGASTRIC 09/01/2007    Willow Ora, PTA, [facility] Outpatient Neuro [facility] [address] [place], [place] [address] [phone] 01/24/22, 9:18 AM   Name: James Olsen MRN: [id number] Date of Birth: 17-Aug-1946

## 2022-01-24 ENCOUNTER — Telehealth: Payer: Self-pay | Admitting: Internal Medicine

## 2022-01-24 NOTE — Telephone Encounter (Signed)
Scheduled per 2/2 los, pt has been called and confirmed  °

## 2022-01-28 ENCOUNTER — Ambulatory Visit: Payer: Medicare Other | Admitting: Rehabilitation

## 2022-01-28 ENCOUNTER — Ambulatory Visit: Payer: Self-pay | Admitting: Radiation Oncology

## 2022-01-30 ENCOUNTER — Ambulatory Visit: Payer: Medicare Other | Admitting: Rehabilitation

## 2022-01-30 NOTE — Progress Notes (Incomplete)
Mr. Montour presents today for follow-up after completing [facility] treatment to his brain on 12/26/2021  Dose of Decadron, if applicable: ***4 mg PO morning, and 2 mg at bedtime  Recent neurologic symptoms, if any:  Seizures: *** Headaches: *** Nausea: *** Dizziness/ataxia: *** Difficulty with hand coordination: *** Focal numbness/weakness: *** Mobility: *** Visual deficits/changes: *** Confusion/Memory deficits: ***  Additional Complaints / other details: ***Had F/U with Dr. Mickeal Skinner on 01/23/22

## 2022-01-31 ENCOUNTER — Other Ambulatory Visit: Payer: Self-pay | Admitting: Radiation Therapy

## 2022-01-31 ENCOUNTER — Encounter: Payer: Self-pay | Admitting: Radiation Oncology

## 2022-01-31 ENCOUNTER — Ambulatory Visit
Admission: RE | Admit: 2022-01-31 | Discharge: 2022-01-31 | Disposition: A | Discharge status: Home / Self Care | Payer: Medicare Other | Source: Ambulatory Visit | Attending: Radiation Oncology | Admitting: Radiation Oncology

## 2022-01-31 DIAGNOSIS — D420 Neoplasm of uncertain behavior of cerebral meninges: Secondary | ICD-10-CM

## 2022-01-31 NOTE — Progress Notes (Signed)
Radiation Oncology         [phone] ________________________________  Name: RAMAN FEATHERSTON MRN: [id number]  Date: 01/31/2022  DOB: 02-16-1946  Follow-Up Visit Note by telephone.  The patient opted for telemedicine to maximize safety during the pandemic.  MyChart video was not obtainable.   Outpatient  CC: Allwardt, Randa Evens, PA-C  [name], MD  Diagnosis and Prior Radiotherapy:    ICD-10-CM   1. 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       Radiation Treatment Dates: 12/13/2021 through 12/26/2021 Site Technique Total Dose (Gy) Dose per Fx (Gy) Completed Fx Beam Energies  Brain: Brain_salvage IMRT 30/30 6 5/5 6XFFF    Radiation treatment dates:   12/09/2018-01/24/2019 Site/dose:     Left sagittal Brain, 1.8 Gy x 31 fractions for a total dose of 55.8 Gy    CHIEF COMPLAINT: Here for follow-up and surveillance of meningiomas  Narrative:  The patient returns today for routine follow-up - chose phone communications                              Had a little redness of scalp  - resolved w/ sonafine lotion  Daily 5mg  Decadron (currently tapering) is working well for him  Bumps on scalp have not obviously changed.   ALLERGIES:  has No Known Allergies.  Meds: Current Outpatient Medications  Medication Sig Dispense Refill   baclofen (LIORESAL) 10 MG tablet Take 1 tablet twice daily and 1 to 2 tablets at bedtime 360 tablet 2   bethanechol (URECHOLINE) 50 MG tablet Take 1 tablet (50 mg total) by mouth 4 (four) times daily. 120 tablet 0   dexamethasone (DECADRON) 2 MG tablet Take 2 tablets (4 mg total) by mouth daily AND 1 tablet (2 mg total) at bedtime. 90 tablet 2   levETIRAcetam (KEPPRA) 500 MG tablet Take 1 tablet (500 mg total) by mouth 2 (two) times daily. 60 tablet 3   LORazepam (ATIVAN) 1 MG tablet Take 1 tablet 30 minutes before MRIs and/or radiation procedures. Take two tablets if absolutely necessary for claustrophobia. Must have driver after taking medication.  16 tablet 0   magnesium gluconate (MAGONATE) 500 MG tablet Take 500 mg by mouth at bedtime.     Multiple Vitamins-Minerals (PRESERVISION AREDS 2) CAPS Take 1 capsule by mouth 2 (two) times daily.      silodosin (RAPAFLO) 8 MG CAPS capsule Take 8 mg by mouth daily with breakfast.     No current facility-administered medications for this encounter.    Physical Findings: The patient is in no acute distress. Patient is alert and oriented.  vitals were not taken for this visit. .      Lab Findings: Lab Results  Component Value Date   WBC 5.4 07/15/2021   HGB 13.0 07/15/2021   HCT 40.2 07/15/2021   MCV 92.6 07/15/2021   PLT 217 07/15/2021    Radiographic Findings: No results found.  Impression/Plan:  healing well from RT per report.  Continue follow-up w/ neuro onc. MRI ordered for late March. Will review at CNS board. I will see him PRN and he knows to call Dr. Mickeal Skinner if symptoms worsen (he can certainly call me, too).  This encounter was provided by telemedicine platform; patient desired telemedicine during pandemic precautions. [place] not available, so  phone was used. The patient has given verbal consent for this type of encounter and has been advised to only accept  a meeting of this type in a secure network environment. On date of service, in total, I spent 11 minutes on this encounter.   The attendants for this meeting include Eppie Gibson  and [name] During the encounter, Eppie Gibson was located at [facility] Radiation Oncology Department.  [name] was located at home.   _____________________________________   Eppie Gibson, MD

## 2022-02-10 ENCOUNTER — Telehealth: Payer: Self-pay | Admitting: *Deleted

## 2022-02-10 NOTE — Telephone Encounter (Signed)
Patient called to report difficulty with increasing weakness to right leg and arm.  States his right foot is almost dragging at this point.   Attempted to review the last instructions for Decadron 4mg /2mg  but patient states that he was told to try to decrease Decadron by 1 mg each Monday at that last phone visit.  No documentation to support.  He went down to Decadron 4 mg on 02/03/2022 and Decadron 3 mg 02/10/2022 and reports that all of these symptoms have been getting worse with both doses.    He was unable to articulate what dose provided him the ideal function on his right side.  States that aphasia is about the same but feels that it gets worse when he feels pressure to suddenly coordinate his thoughts and express those.  He knows the goal is to get off of the steroid but reports concern for how that will be effective if he is unable to tolerate 4 mg or lower.  Routing to Dr Mickeal Skinner for instruction.

## 2022-02-10 NOTE — Telephone Encounter (Signed)
Per Dr Mickeal Skinner patient should increase back to 4mg /mg of Decadron.    He advised he needs to see the patient in office for an evaluation.

## 2022-02-11 ENCOUNTER — Other Ambulatory Visit: Payer: Self-pay

## 2022-02-11 ENCOUNTER — Emergency Department (HOSPITAL_BASED_OUTPATIENT_CLINIC_OR_DEPARTMENT_OTHER)
Admission: EM | Admit: 2022-02-11 | Discharge: 2022-02-11 | Disposition: A | Discharge status: Home / Self Care | Payer: Medicare Other | Attending: Emergency Medicine | Admitting: Emergency Medicine

## 2022-02-11 ENCOUNTER — Encounter (HOSPITAL_BASED_OUTPATIENT_CLINIC_OR_DEPARTMENT_OTHER): Payer: Self-pay

## 2022-02-11 ENCOUNTER — Emergency Department (HOSPITAL_BASED_OUTPATIENT_CLINIC_OR_DEPARTMENT_OTHER): Payer: Medicare Other

## 2022-02-11 DIAGNOSIS — R569 Unspecified convulsions: Secondary | ICD-10-CM | POA: Diagnosis present

## 2022-02-11 DIAGNOSIS — R531 Weakness: Secondary | ICD-10-CM | POA: Diagnosis not present

## 2022-02-11 DIAGNOSIS — R251 Tremor, unspecified: Secondary | ICD-10-CM | POA: Insufficient documentation

## 2022-02-11 DIAGNOSIS — G40209 Localization-related (focal) (partial) symptomatic epilepsy and epileptic syndromes with complex partial seizures, not intractable, without status epilepticus: Secondary | ICD-10-CM | POA: Insufficient documentation

## 2022-02-11 LAB — CBC
HCT: 49 % (ref 39.0–52.0)
Hemoglobin: 16.1 g/dL (ref 13.0–17.0)
MCH: 29.8 pg (ref 26.0–34.0)
MCHC: 32.9 g/dL (ref 30.0–36.0)
MCV: 90.6 fL (ref 80.0–100.0)
Platelets: 175 10*3/uL (ref 150–400)
RBC: 5.41 MIL/uL (ref 4.22–5.81)
RDW: 14.8 % (ref 11.5–15.5)
WBC: 8.5 10*3/uL (ref 4.0–10.5)
nRBC: 0 % (ref 0.0–0.2)

## 2022-02-11 LAB — BASIC METABOLIC PANEL
Anion gap: 10 (ref 5–15)
BUN: 26 mg/dL — ABNORMAL HIGH (ref 8–23)
CO2: 26 mmol/L (ref 22–32)
Calcium: 9.3 mg/dL (ref 8.9–10.3)
Chloride: 99 mmol/L (ref 98–111)
Creatinine, Ser: 0.79 mg/dL (ref 0.61–1.24)
GFR, Estimated: >60 mL/min (ref >60)
Glucose, Bld: 139 mg/dL — ABNORMAL HIGH (ref 70–99)
Potassium: 4.4 mmol/L (ref 3.5–5.1)
Sodium: 135 mmol/L (ref 135–145)

## 2022-02-11 LAB — CBG MONITORING, ED: Glucose-Capillary: 125 mg/dL — ABNORMAL HIGH (ref 70–99)

## 2022-02-11 MED ORDER — SODIUM CHLORIDE 0.9 % IV SOLN: 2000.0000 mg | Freq: Once | INTRAVENOUS | Status: DC

## 2022-02-11 MED ORDER — LEVETIRACETAM IN NACL 1000 MG/100ML IV SOLN
1000.0000 mg | INTRAVENOUS | Status: AC
  Administered 2022-02-11 (×2): 1000 mg via INTRAVENOUS

## 2022-02-11 MED ORDER — LEVETIRACETAM 500 MG PO TABS: 1000.0000 mg | ORAL_TABLET | Freq: Two times a day (BID) | ORAL | 3 refills | Status: DC

## 2022-02-11 NOTE — ED Provider Notes (Signed)
[facility] EMERGENC[facility] DEPT  Provider Note  CSN: [id number] Arrival date & time: 02/11/22 1840  History Chief Complaint  Patient presents with   Near Seizure    James Olsen is a 76 y.o. male with history of atypical meningioma which has recurred s/p craniotomy more recently had brain radiation. He had a focal seizure one time prior in April 2022 and has been taking Keppra 500mg  BID since then. He had recently been tapering his daily decadron but noticed some increased weakness in his R leg so he was advised by Dr. Mickeal Skinner to increase back to 6mg  daily yesterday. Today he was sitting on the couch when he began to have R eye deviation and shaking of his R arm, similar to previous focal seizure. It lasted for about a minute, did not progress to [place] mal and resolved without intervention. He has felt fine since then but came to the ED for evaluation. Currently is without complaint.    Home Medications Prior to Admission medications   Medication Sig Start Date End Date Taking? Authorizing Provider  baclofen (LIORESAL) 10 MG tablet Take 1 tablet twice daily and 1 to 2 tablets at bedtime 12/11/21   Meredith Staggers, MD  bethanechol (URECHOLINE) 50 MG tablet Take 1 tablet (50 mg total) by mouth 4 (four) times daily. 07/17/21   Love, Ivan Anchors, PA-C  dexamethasone (DECADRON) 2 MG tablet Take 2 tablets (4 mg total) by mouth daily AND 1 tablet (2 mg total) at bedtime. 01/09/22   [name], MD  levETIRAcetam (KEPPRA) 500 MG tablet Take 2 tablets (1,000 mg total) by mouth 2 (two) times daily. 02/11/22   Truddie Hidden, MD  LORazepam (ATIVAN) 1 MG tablet Take 1 tablet 30 minutes before MRIs and/or radiation procedures. Take two tablets if absolutely necessary for claustrophobia. Must have driver after taking medication. 11/22/21   Eppie Gibson, MD  magnesium gluconate (MAGONATE) 500 MG tablet Take 500 mg by mouth at bedtime.    [provider]  Multiple  Vitamins-Minerals (PRESERVISION AREDS 2) CAPS Take 1 capsule by mouth 2 (two) times daily.     [provider]  silodosin (RAPAFLO) 8 MG CAPS capsule Take 8 mg by mouth daily with breakfast.    [provider]     Allergies    Patient has no known allergies.   Review of Systems   Review of Systems Please see HPI for pertinent positives and negatives  Physical Exam BP (!) 139/92    Pulse (!) 59    Temp 97.9 F (36.6 C)    Resp 17    Ht 5\' 9"  (1.753 m)    Wt 85 kg    SpO2 99%    BMI 27.67 kg/m   Physical Exam Vitals and nursing note reviewed.  Constitutional:      Appearance: Normal appearance.  HENT:     Head: Normocephalic and atraumatic.     Nose: Nose normal.     Mouth/Throat:     Mouth: Mucous membranes are moist.  Eyes:     Extraocular Movements: Extraocular movements intact.     Conjunctiva/sclera: Conjunctivae normal.  Cardiovascular:     Rate and Rhythm: Normal rate.  Pulmonary:     Effort: Pulmonary effort is normal.     Breath sounds: Normal breath sounds.  Abdominal:     General: Abdomen is flat.     Palpations: Abdomen is soft.     Tenderness: There is no abdominal tenderness.  Musculoskeletal:        General: No swelling. Normal range of motion.     Cervical back: Neck supple.  Skin:    General: Skin is warm and dry.  Neurological:     Mental Status: He is alert and oriented to person, place, and time.     Comments: Mild weakness of RLE compared to LLE, otherwise no focal deficits  Psychiatric:        Mood and Affect: Mood normal.    ED Results / Procedures / Treatments   EKG EKG Interpretation  Date/Time:  Tuesday February 11 2022 19:03:42 EST Ventricular Rate:  71 PR Interval:  152 QRS Duration: 102 QT Interval:  380 QTC Calculation: 412 R Axis:   2 Text Interpretation: Normal sinus rhythm Normal ECG When compared with ECG of 19-Apr-2021 15:31, No significant change since last tracing Confirmed by Calvert Cantor [phone])  on 02/11/2022 9:27:34 PM  Procedures Procedures  Medications Ordered in the ED Medications  levETIRAcetam (KEPPRA) IVPB 1000 mg/100 mL premix (1,000 mg Intravenous New Bag/Given 02/11/22 2250)    Initial Impression and Plan  Patient with known meningoma, here with focal seizure. Has been compliant with meds. Will check labs and CT head.   ED Course   Clinical Course as of 02/11/22 2259  Tue Feb 11, 2022  2240 CBC is normal, BMP unremarkable.  [CS]  2245 I personally viewed the images from radiology studies and agree with radiologist interpretation: No acute process Spoke with Dr. Rory Percy, Neurology, regarding patient's seizure. He recommends a Keppra 2000mg  IV load and then increasing his home dose to 1000mg  BID. Patient and wife are amenable to that plan.   [CS]    Clinical Course User Index [CS] Truddie Hidden, MD     MDM Rules/Calculators/A&P Medical Decision Making Given presenting complaint, I considered that admission might be necessary. After review of results from ED lab and/or imaging studies, admission to the hospital is not indicated at this time.    Problems Addressed: Focal seizure [facility]): acute illness or injury  Amount and/or Complexity of Data Reviewed Labs: ordered. Decision-making details documented in ED Course. Radiology: ordered and independent interpretation performed. Decision-making details documented in ED Course.  Risk Prescription drug management. Decision regarding hospitalization.    Final Clinical Impression(s) / ED Diagnoses Final diagnoses:  Focal seizure ([place])    Rx / DC Orders ED Discharge Orders          Ordered    levETIRAcetam (KEPPRA) 500 MG tablet  2 times daily        02/11/22 2257             Truddie Hidden, MD 02/11/22 2259

## 2022-02-11 NOTE — ED Triage Notes (Signed)
Patient here POV from Home with Near Seizure.  Patient states today approximately at 1815 today the Patient began lean to the Right and was close to having a Seizure. Patient did not have Seizure today.   Patient has had one Seizure previously in April of 2022. Patient does take [place] for Same.  NAD Noted during Triage. A&Ox4. GCS 15. Ambulatory with Gilford Rile.

## 2022-02-12 ENCOUNTER — Ambulatory Visit: Payer: Medicare Other | Admitting: Rehabilitation

## 2022-02-12 ENCOUNTER — Encounter: Payer: Self-pay | Admitting: Rehabilitation

## 2022-02-12 DIAGNOSIS — R2681 Unsteadiness on feet: Secondary | ICD-10-CM

## 2022-02-12 DIAGNOSIS — M21371 Foot drop, right foot: Secondary | ICD-10-CM

## 2022-02-12 DIAGNOSIS — M6281 Muscle weakness (generalized): Secondary | ICD-10-CM

## 2022-02-12 DIAGNOSIS — R2689 Other abnormalities of gait and mobility: Secondary | ICD-10-CM

## 2022-02-12 NOTE — Patient Instructions (Signed)
Access Code: [facility] URL: [email or web address] Date: 02/12/2022 Prepared by: Cameron Sprang  Exercises Standing March with Counter Support - 1 x daily - 7 x weekly - 1 sets - 10 reps Standing Hip Abduction with Counter Support - 1 x daily - 7 x weekly - 1 sets - 10 reps Seated Knee Flexion Slide - 1 x daily - 7 x weekly - 1 sets - 10 reps Sit to Stand Without Arm Support - 1 x daily - 7 x weekly - 1 sets - 10 reps

## 2022-02-12 NOTE — Therapy (Signed)
[facility] [address] [place], [place], [address] Phone: [phone]   Fax:  [phone]  Physical Therapy Treatment and Recert   Patient Details  Name: James Olsen MRN: [id number] Date of Birth: 1946-08-18 Referring Provider (PT): Meredith Staggers, MD   Encounter Date: 02/12/2022   PT End of Session - 02/12/22 1603     Visit Number 29    Number of Visits 45   per updated POC   Date for PT Re-Evaluation 04/13/22   per updated POC   Authorization Type Medicare and [place] - 10th visit PN    Progress Note Due on Visit 30    PT Start Time 1146    PT Stop Time 1229    PT Time Calculation (min) 43 min    Equipment Utilized During Treatment Gait belt;Other (comment)   right AFO   Activity Tolerance Patient tolerated treatment well    Behavior During Therapy WFL for tasks assessed/performed             Past Medical History:  Diagnosis Date   Allergy    Arthritis    Cataract    GERD (gastroesophageal reflux disease)    Glaucoma    pt denies.    History of blood transfusion     5 units after previous surgery   History of radiation therapy 12/09/18- 01/24/2019   Left sagittal brain, 11.8 Gy in 31 fractions for a total dose of 55.8 Gy   Hyperlipidemia    Macular degeneration    Seizures ([place])    Stroke [facility])    after surgery in August 2019 mostly recovered still some weakness in left arm   Wears glasses     Past Surgical History:  Procedure Laterality Date   APPLICATION OF CRANIAL NAVIGATION N/A 06/21/2021   Procedure: APPLICATION OF CRANIAL NAVIGATION;  Surgeon: [name], MD;  Location: [facility];  Service: Neurosurgery;  Laterality: N/A;   BRAIN SURGERY     CHOLECYSTECTOMY  2008   lap choli   COLONOSCOPY     CRANIOPLASTY N/A 10/29/2018   Procedure: CRANIOPLASTY;  Surgeon: [name], MD;  Location: [facility];  Service: Neurosurgery;  Laterality: N/A;  CRANIOPLASTY   CRANIOTOMY N/A 08/18/2018    Procedure: CRANIOTOMY TUMOR EXCISION;  Surgeon: [name], MD;  Location: [facility];  Service: Neurosurgery;  Laterality: N/A;  CRANIOTOMY TUMOR EXCISION   CRANIOTOMY Bilateral 06/21/2021   Procedure: Bilateral Craniotomy for tumor resection;  Surgeon: [name], MD;  Location: [facility];  Service: Neurosurgery;  Laterality: Bilateral;   DIAGNOSTIC LAPAROSCOPY     EYE SURGERY     laser surgery to relieve pressure   NAILBED REPAIR Left 05/04/2014   Procedure: LEFT INDEX NAIL ABLATION V-Y FLAP COVERAGE;  Surgeon: [name]., MD;  Location: [facility];  Service: Orthopedics;  Laterality: Left;  index   PILONIDAL CYST EXCISION     x2   TONSILLECTOMY     VASECTOMY      There were no vitals filed for this visit.   Subjective Assessment - 02/12/22 1602     Subjective Pt reports having small seizure yesterday.  He did go to ED and after speaking with neuro, increased Kepra.  He sees Dr. Mickeal Skinner next week regarding steriods.  Continues to have increased RLE weakness.    Pertinent History OA, h/o blood transfusion, h/o radiation therapy, macular degeneration, CVA, craniotomy > cranioplasty and resection of meningioma, R hemiparesis, focal seizures, urinary  retention    Patient Stated Goals Walking, gain more strength in RLE to be able to drive in 6 months if seizure free    Currently in Pain? No/denies                               [facility] Adult PT Treatment/Exercise - 02/12/22 1200       Transfers   Transfers Sit to Stand;Stand to Sit    Sit to Stand 5: Supervision;With upper extremity assist;From chair/3-in-1;From bed    Sit to Stand Details Verbal cues for technique    Stand to Sit 5: Supervision;With upper extremity assist;To bed;To chair/3-in-1    Comments Performed x 10 reps with cues for forward reach and increased trunk flexion to have better control when sitting.  Added to HEP      Ambulation/Gait   Ambulation/Gait Yes    Ambulation/Gait  Assistance 5: Supervision;4: Min guard    Ambulation/Gait Assistance Details Pt able to ambulate outdoors over unlevel paved surfaces with RW and AFO at S to min/guard A for safety.  Pt very fatigued but wishing to continue to tell wife something (was in car nearby).  Note continued R foot drag throughout despite cues for more hip flex esp with increased fatigue but no tripping/LOB.    Ambulation Distance (Feet) 500 Feet   150' indoors   Assistive device Rolling walker   AFO   Gait Pattern Step-through pattern;Decreased step length - right;Decreased arm swing - right;Decreased stride length;Decreased hip/knee flexion - right;Decreased dorsiflexion - right;Poor foot clearance - right    Ambulation Surface Level;Unlevel;Indoor;Outdoor;Paved    Gait velocity 2.32 ft/sec with RW and AFO      Neuro Re-ed    Neuro Re-ed Details  Performed exercises as in updated HEP, see pt instruction for details.              Access Code: [facility] URL: [email or web address] Date: 02/12/2022 Prepared by: Cameron Sprang   Exercises Standing March with Counter Support - 1 x daily - 7 x weekly - 1 sets - 10 reps Standing Hip Abduction with Counter Support - 1 x daily - 7 x weekly - 1 sets - 10 reps Seated Knee Flexion Slide - 1 x daily - 7 x weekly - 1 sets - 10 reps Sit to Stand Without Arm Support - 1 x daily - 7 x weekly - 1 sets - 10 reps       PT Education - 02/12/22 1603     Education Details updated HEP    Person(s) Educated Patient    Methods Explanation;Demonstration;Handout    Comprehension Verbalized understanding;Returned demonstration              PT Short Term Goals - 02/12/22 1608       PT SHORT TERM GOAL #1   Title Pt will be IND with ongoing HEP in order to indicate improved functional mobility/balance.  (Target Date:03/14/22)    Time 4    Period Weeks    Status New    Target Date 03/14/22      PT SHORT TERM GOAL #2   Title Pt will demonstrate improved  functional LE strength as indicated by ability to perform 10 reps of sit <> stand WITHOUT use of UE in 30 seconds    Time 4    Period Weeks    Status New      PT SHORT TERM GOAL #3   Title  Will improve Mini Best by 4 points in order to indicate improved balance and mobility.    Baseline 16/28 on last assessment    Time 4    Period Weeks    Status New      PT SHORT TERM GOAL #4   Title Pt will improve gait velocity with RW to >/= 2.62 ft/sec    Baseline 2.32 ft/sec on 2/22    Time 4    Period Weeks    Status New      PT SHORT TERM GOAL #5   Title Pt will ambulate with quad tip cane x 50' over level indoor surfaces with min/guard in order to increase independence at home.    Time 4    Period Weeks    Status New               PT Long Term Goals - 02/12/22 1613       PT LONG TERM GOAL #1   Title Pt will demonstrate independence with final HEP    Baseline not performing consistently and reports is too difficult    Time 4    Period Weeks    Status Not Met    Target Date 01/30/22      PT LONG TERM GOAL #2   Title Pt will increase gait velocity with AFO and cane to >/= 2.62 ft/sec without LOB    Baseline 2.32 ft/sec on 2/22    Time 4    Period Weeks    Status Not Met    Target Date 01/30/22      PT LONG TERM GOAL #3   Title Pt will increase score on Mini-Best test by 4 points to indicate decreased falls risk    Baseline 1/9: score unchanged 16/28    Time 4    Period Weeks    Status On-going    Target Date 01/30/22      PT LONG TERM GOAL #4   Title Pt will ambulate x 250' outside over uneven pavement, negotiate ramp, curb and 12 stairs with AFO and CANE, MOD I.    Baseline Was able to ambulate 500' with min/guard A at times with AFO and RW    Time 4    Period Weeks    Status Not Met    Target Date 01/30/22               PT Long Term Goals - 02/12/22 1620       PT LONG TERM GOAL #1   Title Pt will demonstrate independence with final HEP (Target  Date: 04/13/22)    Time 8    Period Weeks    Status On-going    Target Date 04/13/22      PT LONG TERM GOAL #2   Title Pt will demonstrate gait velocity of 2.0 ft/sec or better with quad tip cane and AFO in order to indicate dec fall risk.    Baseline 2.32 ft/sec on 2/22 with RW and AFO    Time 8    Period Weeks    Status Revised      PT LONG TERM GOAL #3   Title Pt will increase score on Mini-Best test by 4 points to indicate decreased falls risk    Time 8    Period Weeks    Status On-going      PT LONG TERM GOAL #4   Title Pt will ambulate x 250' outside over uneven pavement, negotiate ramp, curb and 12  stairs with AFO and CANE, S level.    Baseline Was able to ambulate 500' with min/guard A at times with AFO and RW    Time 8    Period Weeks    Status Revised                  Plan - 02/12/22 1604     Clinical Impression Statement Skilled session focused on assessment of goals as able, updated HEP to reflect decline in progress since radiation therapy.  Pt would like to continue therapy but does endorse not being as compliant with HEP/activity at home.    Personal Factors and Comorbidities Comorbidity 3+;Past/Current Experience;Social Background;Transportation    Comorbidities OA, h/o blood transfusion, h/o radiation therapy, macular degeneration, CVA, craniotomy > cranioplasty and resection of meningioma, R hemiparesis, focal seizures, urinary retention    Examination-Activity Limitations Locomotion Level;Stairs;Stand    Examination-Participation Restrictions Community Activity;Driving;Yard Work    [employer] Evolving/Moderate complexity    Clinical Decision Making Moderate    Rehab Potential Good    PT Frequency 2x / week    PT Duration 8 weeks    PT Treatment/Interventions ADLs/Self Care Home Management;Aquatic Therapy;DME Instruction;Gait training;Stair training;Functional mobility training;Therapeutic activities;Therapeutic exercise;Balance  training;Neuromuscular re-education;Patient/family education;Passive range of motion    PT Next Visit Plan No estim due to CA and seizures.   Continue to address gait with cane - especially turns and use of stepping strategy if R toe drags, LE strengthening with emphasis on hamstring ex's, treadmil working on inclines/declines and retro gait and balance reaction training, SLS on RLE    PT Home Exercise Plan Access Code: [email or web address]    Consulted and Agree with Plan of Care Patient             Patient will benefit from skilled therapeutic intervention in order to improve the following deficits and impairments:  Abnormal gait, Decreased activity tolerance, Decreased balance, Decreased strength, Difficulty walking, Decreased coordination, Decreased mobility, Impaired sensation, Postural dysfunction, Impaired flexibility, Decreased endurance  Visit Diagnosis: Unsteadiness on feet  Muscle weakness (generalized)  Foot drop, right  Other abnormalities of gait and mobility     Problem List Patient Active Problem List   Diagnosis Date Noted   Spasticity 09/11/2021   Urinary retention 07/17/2021   S/P resection of meningioma 06/26/2021   S/P craniotomy 06/21/2021   Focal seizures ([place]) 05/06/2021   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[place]) 11/13/2018   Other abnormalities of gait and mobility 09/17/2018   Acute cerebral infarction ([facility])    Thrombocytopenia ([place])    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 ([place])    Acute blood loss anemia    Generalized OA    Gastroesophageal reflux disease    Seizure prophylaxis    Leukocytosis    Tumor 08/18/2018   MACULAR DEGENERATION 02/29/2008   ALLERGY 02/29/2008   Nonspecific (abnormal) findings on radiological and other examination of body structure 12/26/2007   NONSPECIFIC ABNORM FIND RAD&OTH EXAM LUNG FIELD 12/26/2007   GALLSTONES 10/01/2007   ELEVATION, TRANSAMINASE/LDH LEVELS 09/06/2007   HEPATITIS NOS  09/01/2007   ABDOMINAL PAIN, EPIGASTRIC 09/01/2007    Dominiqua Cooner, Betha Loa, PT 02/12/2022, 4:20 PM  [facility] [address] [place], [place], [address] Phone: [phone]   Fax:  [phone]  Name: ASHWIN TIBBS MRN: [id number] Date of Birth: 07-08-1946

## 2022-02-14 ENCOUNTER — Ambulatory Visit: Payer: Medicare Other | Admitting: Physical Therapy

## 2022-02-14 ENCOUNTER — Other Ambulatory Visit: Payer: Self-pay

## 2022-02-14 ENCOUNTER — Encounter: Payer: Self-pay | Admitting: Physical Therapy

## 2022-02-14 DIAGNOSIS — R2689 Other abnormalities of gait and mobility: Secondary | ICD-10-CM

## 2022-02-14 DIAGNOSIS — R2681 Unsteadiness on feet: Secondary | ICD-10-CM | POA: Diagnosis not present

## 2022-02-14 DIAGNOSIS — M21371 Foot drop, right foot: Secondary | ICD-10-CM

## 2022-02-14 DIAGNOSIS — M6281 Muscle weakness (generalized): Secondary | ICD-10-CM

## 2022-02-14 NOTE — Therapy (Signed)
[facility] [address] [place], [place], [address] Phone: [phone]   Fax:  [phone]  Physical Therapy Treatment  Patient Details  Name: James Olsen MRN: [id number] Date of Birth: 1946/04/25 Referring Provider (PT): Meredith Staggers, MD   Encounter Date: 02/14/2022   PT End of Session - 02/14/22 1324     Visit Number 30    Number of Visits 45   per updated POC   Date for PT Re-Evaluation 04/13/22   per updated POC   Authorization Type Medicare and [place] - 10th visit PN    Progress Note Due on Visit 29    PT Start Time 1319    PT Stop Time 1400    PT Time Calculation (min) 41 min    Equipment Utilized During Treatment Gait belt;Other (comment)   right AFO   Activity Tolerance Patient tolerated treatment well    Behavior During Therapy WFL for tasks assessed/performed             Past Medical History:  Diagnosis Date   Allergy    Arthritis    Cataract    GERD (gastroesophageal reflux disease)    Glaucoma    pt denies.    History of blood transfusion     5 units after previous surgery   History of radiation therapy 12/09/18- 01/24/2019   Left sagittal brain, 11.8 Gy in 31 fractions for a total dose of 55.8 Gy   Hyperlipidemia    Macular degeneration    Seizures ([place])    Stroke [facility])    after surgery in August 2019 mostly recovered still some weakness in left arm   Wears glasses     Past Surgical History:  Procedure Laterality Date   APPLICATION OF CRANIAL NAVIGATION N/A 06/21/2021   Procedure: APPLICATION OF CRANIAL NAVIGATION;  Surgeon: [name], MD;  Location: [facility];  Service: Neurosurgery;  Laterality: N/A;   BRAIN SURGERY     CHOLECYSTECTOMY  2008   lap choli   COLONOSCOPY     CRANIOPLASTY N/A 10/29/2018   Procedure: CRANIOPLASTY;  Surgeon: [name], MD;  Location: [facility];  Service: Neurosurgery;  Laterality: N/A;  CRANIOPLASTY   CRANIOTOMY N/A 08/18/2018   Procedure:  CRANIOTOMY TUMOR EXCISION;  Surgeon: [name], MD;  Location: [facility];  Service: Neurosurgery;  Laterality: N/A;  CRANIOTOMY TUMOR EXCISION   CRANIOTOMY Bilateral 06/21/2021   Procedure: Bilateral Craniotomy for tumor resection;  Surgeon: [name], MD;  Location: [facility];  Service: Neurosurgery;  Laterality: Bilateral;   DIAGNOSTIC LAPAROSCOPY     EYE SURGERY     laser surgery to relieve pressure   NAILBED REPAIR Left 05/04/2014   Procedure: LEFT INDEX NAIL ABLATION V-Y FLAP COVERAGE;  Surgeon: [name]., MD;  Location: [facility];  Service: Orthopedics;  Laterality: Left;  index   PILONIDAL CYST EXCISION     x2   TONSILLECTOMY     VASECTOMY      There were no vitals filed for this visit.   Subjective Assessment - 02/14/22 1322     Subjective No new seizures. Since last seizure pt reports having pain at left temporal area that starts as localized and then radiates at times. Not consistent, comes and goes. See's Dr. Mickeal Skinner next Thursday, will call before then if it gets worse.    Pertinent History OA, h/o blood transfusion, h/o radiation therapy, macular degeneration, CVA, craniotomy > cranioplasty and resection of meningioma, R hemiparesis,  focal seizures, urinary retention    Limitations Standing;Walking;House hold activities    Patient Stated Goals Walking, gain more strength in RLE to be able to drive in 6 months if seizure free    Currently in Pain? No/denies    Pain Score 0-No pain                    [facility] Adult PT Treatment/Exercise - 02/14/22 1327       Transfers   Transfers Sit to Stand;Stand to Sit    Sit to Stand 5: Supervision;With upper extremity assist;From chair/3-in-1;From bed    Stand to Sit 5: Supervision;With upper extremity assist;To bed;To chair/3-in-1      Ambulation/Gait   Ambulation/Gait Yes    Ambulation/Gait Assistance 5: Supervision;4: Min guard    Ambulation/Gait Assistance Details cues for posture. occasional  scuffing noted on right foot, slides though now that pt has new toe cap on.    Ambulation Distance (Feet) 100 Feet   x1, around gym with session   Assistive device Rolling walker    Gait Pattern Step-through pattern;Decreased step length - right;Decreased arm swing - right;Decreased stride length;Decreased hip/knee flexion - right;Decreased dorsiflexion - right;Poor foot clearance - right    Ambulation Surface Level;Indoor      Exercises   Exercises Other Exercises    Other Exercises  lying on mat table: right leg bend with heel on mat due to brace/left LE straight- bridge x 10 reps with assist to maintain right LE position. then bil LE bridge with yoga block squeeze for 10 reps, assist to reposition yoga block after each rep. then with red band around both legs: right LE marching, then hip fall out with assit at times for controlled movements.      Knee/Hip Exercises: Aerobic   Stepper Scifit stepper at level 4.5 x 8 minutes with goal >/= 90 steps per minute for strengthening and activity tolerance.                       PT Short Term Goals - 02/12/22 1608       PT SHORT TERM GOAL #1   Title Pt will be IND with ongoing HEP in order to indicate improved functional mobility/balance.  (Target Date:03/14/22)    Time 4    Period Weeks    Status New    Target Date 03/14/22      PT SHORT TERM GOAL #2   Title Pt will demonstrate improved functional LE strength as indicated by ability to perform 10 reps of sit <> stand WITHOUT use of UE in 30 seconds    Time 4    Period Weeks    Status New      PT SHORT TERM GOAL #3   Title Will improve Mini Best by 4 points in order to indicate improved balance and mobility.    Baseline 16/28 on last assessment    Time 4    Period Weeks    Status New      PT SHORT TERM GOAL #4   Title Pt will improve gait velocity with RW to >/= 2.62 ft/sec    Baseline 2.32 ft/sec on 2/22    Time 4    Period Weeks    Status New      PT SHORT TERM  GOAL #5   Title Pt will ambulate with quad tip cane x 50' over level indoor surfaces with min/guard in order to increase independence at home.  Time 4    Period Weeks    Status New               PT Long Term Goals - 02/12/22 1620       PT LONG TERM GOAL #1   Title Pt will demonstrate independence with final HEP (Target Date: 04/13/22)    Time 8    Period Weeks    Status On-going    Target Date 04/13/22      PT LONG TERM GOAL #2   Title Pt will demonstrate gait velocity of 2.0 ft/sec or better with quad tip cane and AFO in order to indicate dec fall risk.    Baseline 2.32 ft/sec on 2/22 with RW and AFO    Time 8    Period Weeks    Status Revised      PT LONG TERM GOAL #3   Title Pt will increase score on Mini-Best test by 4 points to indicate decreased falls risk    Time 8    Period Weeks    Status On-going      PT LONG TERM GOAL #4   Title Pt will ambulate x 250' outside over uneven pavement, negotiate ramp, curb and 12 stairs with AFO and CANE, S level.    Baseline Was able to ambulate 500' with min/guard A at times with AFO and RW    Time 8    Period Weeks    Status Revised                   Plan - 02/14/22 1324     Clinical Impression Statement Today's skilled session continued to focus on activity tolerance, right LE strengthening and gait with RW. Pt easily fatigued with activitiy/exercises, needing short rest breaks. The pt should benefit from continued PT to progress toward unmet goals.    Personal Factors and Comorbidities Comorbidity 3+;Past/Current Experience;Social Background;Transportation    Comorbidities OA, h/o blood transfusion, h/o radiation therapy, macular degeneration, CVA, craniotomy > cranioplasty and resection of meningioma, R hemiparesis, focal seizures, urinary retention    Examination-Activity Limitations Locomotion Level;Stairs;Stand    Examination-Participation Restrictions Community Activity;Driving;Yard Work     [employer] Evolving/Moderate complexity    Rehab Potential Good    PT Frequency 2x / week    PT Duration 8 weeks    PT Treatment/Interventions ADLs/Self Care Home Management;Aquatic Therapy;DME Instruction;Gait training;Stair training;Functional mobility training;Therapeutic activities;Therapeutic exercise;Balance training;Neuromuscular re-education;Patient/family education;Passive range of motion    PT Next Visit Plan No estim due to CA and seizures, no aquatics due to seizures. continue to work on strengthening, activity tolerance and progress to balance with decreased UE support.    PT Home Exercise Plan Access Code: [email or web address]    Consulted and Agree with Plan of Care Patient             Patient will benefit from skilled therapeutic intervention in order to improve the following deficits and impairments:  Abnormal gait, Decreased activity tolerance, Decreased balance, Decreased strength, Difficulty walking, Decreased coordination, Decreased mobility, Impaired sensation, Postural dysfunction, Impaired flexibility, Decreased endurance  Visit Diagnosis: Unsteadiness on feet  Muscle weakness (generalized)  Foot drop, right  Other abnormalities of gait and mobility     Problem List Patient Active Problem List   Diagnosis Date Noted   Spasticity 09/11/2021   Urinary retention 07/17/2021   S/P resection of meningioma 06/26/2021   S/P craniotomy 06/21/2021   Focal seizures ([place]) 05/06/2021   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[place])  11/13/2018   Other abnormalities of gait and mobility 09/17/2018   Acute cerebral infarction ([facility])    Thrombocytopenia ([facility])    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 ([place])    Acute blood loss anemia    Generalized OA    Gastroesophageal reflux disease    Seizure prophylaxis    Leukocytosis    Tumor 08/18/2018   MACULAR DEGENERATION 02/29/2008   ALLERGY 02/29/2008   Nonspecific (abnormal)  findings on radiological and other examination of body structure 12/26/2007   NONSPECIFIC ABNORM FIND RAD&OTH EXAM LUNG FIELD 12/26/2007   GALLSTONES 10/01/2007   ELEVATION, TRANSAMINASE/LDH LEVELS 09/06/2007   HEPATITIS NOS 09/01/2007   ABDOMINAL PAIN, EPIGASTRIC 09/01/2007    Willow Ora, PTA, [facility] Neuro [facility] [address], [place], [place] [address] [phone] 02/14/22, 4:30 PM   Name: James Olsen MRN: [id number] Date of Birth: Aug 27, 1946

## 2022-02-20 ENCOUNTER — Other Ambulatory Visit: Payer: Self-pay

## 2022-02-20 ENCOUNTER — Ambulatory Visit: Payer: Self-pay | Admitting: Rehabilitation

## 2022-02-20 ENCOUNTER — Inpatient Hospital Stay: Payer: Medicare Other | Attending: Internal Medicine | Admitting: Internal Medicine

## 2022-02-20 VITALS — BP 117/74 | HR 95 | Resp 20 | Wt 182.6 lb

## 2022-02-20 DIAGNOSIS — D420 Neoplasm of uncertain behavior of cerebral meninges: Secondary | ICD-10-CM | POA: Diagnosis not present

## 2022-02-20 DIAGNOSIS — C713 Malignant neoplasm of parietal lobe: Secondary | ICD-10-CM | POA: Insufficient documentation

## 2022-02-20 DIAGNOSIS — Z5112 Encounter for antineoplastic immunotherapy: Secondary | ICD-10-CM | POA: Insufficient documentation

## 2022-02-20 DIAGNOSIS — R569 Unspecified convulsions: Secondary | ICD-10-CM | POA: Diagnosis not present

## 2022-02-20 MED ORDER — DEXAMETHASONE 2 MG PO TABS: 4.0000 mg | ORAL_TABLET | Freq: Every day | ORAL | 2 refills | Status: DC

## 2022-02-20 NOTE — Progress Notes (Signed)
[facility] [place], [place] [address] [phone]   Interval Evaluation  Date of Service: 02/20/22 Patient Name: James Olsen Patient MRN: [id number] Patient DOB: 24-Sep-1946 Provider: [name], MD  Identifying Statement:  LORN BUTCHER is a 76 y.o. 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  Keppra had been increased to 1000mg  twice per day, and decadron to 6mg  daily.  No recurrence of seizures, but his right side has become progressively weaker in the past few weeks.  He is dragging the right leg more noticeably; aggressive PT has continued.  There is also increased "bulging" of surgical hardware from his skull at surgical site.   H+P (01/20/19) Patient presented to medical attention in the winter of 2019 after noticing a "bulging" coming from the top of his head.  He also had experienced some numbness of his left leg at the time.  He obtained an MRI which demonstrated a dural based mass extending through the skull.  After a period of monitoring he underwent resection on 10/29/18 with Dr. Christella Noa.  Post-operatively he was very weak on the right side and was found to have a stroke.  After extensive rehabilitation he regained most of his right sided function.  Because of residual tumor and histology (WHO II) radiation was started on 12/19 with Dr. Isidore Moos.  Next week is his final session which he has tolerated without ill effect.  Medications: Current Outpatient Medications on File Prior to Visit  Medication Sig Dispense Refill   baclofen (LIORESAL) 10 MG tablet Take 1 tablet twice daily and 1 to 2  tablets at bedtime 360 tablet 2   bethanechol (URECHOLINE) 50 MG tablet Take 1 tablet (50 mg total) by mouth 4 (four) times daily. 120 tablet 0   dexamethasone (DECADRON) 2 MG tablet Take 2 tablets (4 mg total) by mouth daily AND 1 tablet (2 mg total) at bedtime. 90 tablet 2   levETIRAcetam (KEPPRA) 500 MG tablet Take 2 tablets (1,000 mg total) by mouth 2 (two) times daily. 60 tablet 3   LORazepam (ATIVAN) 1 MG tablet Take 1 tablet 30 minutes before MRIs and/or radiation procedures. Take two tablets if absolutely necessary for claustrophobia. Must have driver after taking medication. 16 tablet 0   magnesium gluconate (MAGONATE) 500 MG tablet Take 500 mg by mouth at bedtime.     Multiple Vitamins-Minerals (PRESERVISION AREDS 2) CAPS Take 1 capsule by mouth 2 (two) times daily.      silodosin (RAPAFLO) 8 MG CAPS capsule Take 8 mg by mouth daily with breakfast.     No current facility-administered medications on file prior to visit.    Allergies: No Known Allergies Past Medical History:  Past Medical History:  Diagnosis Date   Allergy    Arthritis    Cataract    GERD (gastroesophageal reflux disease)    Glaucoma    pt denies.    History of blood transfusion     5 units after previous surgery   History of radiation therapy 12/09/18- 01/24/2019   Left sagittal brain, 11.8 Gy in 31 fractions for a total dose of 55.8 Gy   Hyperlipidemia    Macular degeneration  Seizures ([place])    Stroke [facility])    after surgery in August 2019 mostly recovered still some weakness in left arm   Wears glasses    Past Surgical History:  Past Surgical History:  Procedure Laterality Date   APPLICATION OF CRANIAL NAVIGATION N/A 06/21/2021   Procedure: APPLICATION OF CRANIAL NAVIGATION;  Surgeon: [name], MD;  Location: [facility];  Service: Neurosurgery;  Laterality: N/A;   BRAIN SURGERY     CHOLECYSTECTOMY  2008   lap choli   COLONOSCOPY     CRANIOPLASTY N/A 10/29/2018   Procedure: CRANIOPLASTY;  Surgeon:  [name], MD;  Location: [facility];  Service: Neurosurgery;  Laterality: N/A;  CRANIOPLASTY   CRANIOTOMY N/A 08/18/2018   Procedure: CRANIOTOMY TUMOR EXCISION;  Surgeon: [name], MD;  Location: [facility];  Service: Neurosurgery;  Laterality: N/A;  CRANIOTOMY TUMOR EXCISION   CRANIOTOMY Bilateral 06/21/2021   Procedure: Bilateral Craniotomy for tumor resection;  Surgeon: [name], MD;  Location: [facility];  Service: Neurosurgery;  Laterality: Bilateral;   DIAGNOSTIC LAPAROSCOPY     EYE SURGERY     laser surgery to relieve pressure   NAILBED REPAIR Left 05/04/2014   Procedure: LEFT INDEX NAIL ABLATION V-Y FLAP COVERAGE;  Surgeon: [name]., MD;  Location: [facility];  Service: Orthopedics;  Laterality: Left;  index   PILONIDAL CYST EXCISION     x2   TONSILLECTOMY     VASECTOMY     Social History:  Social History   Socioeconomic History   Marital status: Married    Spouse name: Not on file   Number of children: Not on file   Years of education: Not on file   Highest education level: Not on file  Occupational History   Not on file  Tobacco Use   Smoking status: Never   Smokeless tobacco: Never  Vaping Use   Vaping Use: Never used  Substance and Sexual Activity   Alcohol use: Yes    Comment: occasionally   Drug use: No   Sexual activity: Not Currently  Other Topics Concern   Not on file  Social History Narrative   Not on file   Social Determinants of Health   Financial Resource Strain: Not on file  Food Insecurity: Not on file  Transportation Needs: Not on file  Physical Activity: Not on file  Stress: Not on file  Social Connections: Not on file  Intimate Partner Violence: Not on file   Family History:  Family History  Problem Relation Age of Onset   Diabetes Mother    Skin cancer Brother    Congenital heart disease Brother     Review of Systems: Constitutional: Denies fevers, chills or abnormal weight loss Eyes: Denies blurriness of  vision Ears, nose, mouth, throat, and face: Denies mucositis or sore throat Respiratory: Denies cough, dyspnea or wheezes Cardiovascular: Denies palpitation, chest discomfort or lower extremity swelling Gastrointestinal:  Denies nausea, constipation, diarrhea GU: Denies dysuria or incontinence Skin: Denies abnormal skin rashes Neurological: Per HPI Musculoskeletal: Denies joint pain, back or neck discomfort. No decrease in ROM Behavioral/Psych: Denies anxiety, disturbance in thought content, and mood instability  Physical Exam: Vitals:   02/20/22 1138  BP: 117/74  Pulse: 95  Resp: 20  SpO2: 98%    KPS: 60. General: Alert, cooperative, pleasant, in no acute distress Head: Craniotomy scar noted, dry and intact. EENT: No conjunctival injection or scleral icterus. Oral mucosa moist Lungs: Resp effort normal Cardiac: Regular rate and  rhythm Abdomen: Soft, non-distended abdomen Skin: No rashes cyanosis or petechiae. Extremities: No clubbing or edema  Neurologic Exam: Mental Status: Awake, alert, attentive to examiner. Oriented to self and environment. Language is fluent with intact comprehension.  Cranial Nerves: Visual acuity is grossly normal. Visual fields are full. Extra-ocular movements intact. No ptosis. Face is symmetric, tongue midline. Motor: Tone and bulk are normal. Power is 3/5 in right arm and leg. Reflexes are symmetric, no pathologic reflexes present. Intact finger to nose bilaterally Sensory: Intact to light touch and temperature Gait: Hemiparetic, walker assisted  Labs: I have reviewed the data as listed    Component Value Date/Time   NA 135 02/11/2022 1924   K 4.4 02/11/2022 1924   CL 99 02/11/2022 1924   CO2 26 02/11/2022 1924   GLUCOSE 139 (H) 02/11/2022 1924   BUN 26 (H) 02/11/2022 1924   CREATININE 0.79 02/11/2022 1924   CREATININE 0.84 12/03/2021 1228   CALCIUM 9.3 02/11/2022 1924   PROT 5.2 (L) 06/27/2021 0516   ALBUMIN 2.9 (L) 06/27/2021 0516    AST 21 06/27/2021 0516   ALT 46 (H) 06/27/2021 0516   ALKPHOS 55 06/27/2021 0516   BILITOT 0.8 06/27/2021 0516   GFRNONAA >60 02/11/2022 1924   GFRNONAA >60 12/03/2021 1228   GFRAA >60 11/26/2018 1231   Lab Results  Component Value Date   WBC 8.5 02/11/2022   NEUTROABS 7.3 06/27/2021   HGB 16.1 02/11/2022   HCT 49.0 02/11/2022   MCV 90.6 02/11/2022   PLT 175 02/11/2022    Imaging:  CT Head Wo Contrast  Result Date: 02/11/2022 CLINICAL DATA:  History of brain mass EXAM: CT HEAD WITHOUT CONTRAST TECHNIQUE: Contiguous axial images were obtained from the base of the skull through the vertex without intravenous contrast. RADIATION DOSE REDUCTION: This exam was performed according to the departmental dose-optimization program which includes automated exposure control, adjustment of the mA and/or kV according to patient size and/or use of iterative reconstruction technique. COMPARISON:  MRI 12/04/2021, CT brain 06/22/2021, MRI 02/03/2018, 11/09/2021, 08/18/2021 FINDINGS: Brain: No acute territorial infarction or hemorrhage is visualized. Encephalomalacia within the left frontal lobe and right cranial vertex. Isodense left posterior frontal convexity mass, this measures approximately 19 by 12 mm though better seen on MRI. Redemonstrated extracranial and intracranial mass at the vertex near the craniotomy site. On sagittal images, the extracranial portion of the mass extending into the scalp measures approximately 2.8 cm AP by 0.7 cm thick. Intracranial portion of the mass measures approximately 18 mm AP by 9 mm thick. This is grossly stable as compared with the most recent MRI from December. The ventricles are stable in size. Patchy periventricular white matter hypodensity could relate to chronic small vessel ischemic change. Vascular: No hyperdense vessels.  No unexpected calcification Skull: High frontal parietal craniotomy.  No fracture Sinuses/Orbits: No acute finding. Other: None IMPRESSION: 1.  Status post frontal parietal craniotomy. Grossly similar size of the intra and extracranial mass/meningioma at the vertex since the most recent MRI from December, allowing for limitations of non contrasted CT exam. Grossly stable size of left frontal convexity mass/meningioma. Encephalomalacia within the left frontal lobe and right cranial vertex. 2. Patchy white matter hypodensity consistent with chronic small vessel ischemic change. Electronically Signed   By: Donavan Foil M.D.   On: 02/11/2022 22:07     Assessment/Plan 1. 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  This is in spite  of better control of seizures and higher dose of corticosteroids.   We recommended moving the upcoming MRI brain study up to ASAP to evaluate these changes.  With further progression of disease, avastin or other systemic therapy could be considered.  Decadron should decrease back to 4mg  daily.  Keppra can remain at 1000mg  BID for now.  We ask that [name] return to clinic in 1 weeks following next brain MRI, or sooner as needed.  All questions were answered. The patient knows to call the clinic with any problems, questions or concerns. No barriers to learning were detected.  The total time spent in the encounter was  30 minutes  and more than 50% was on counseling and review of test results   [name], MD Medical Director of Neuro-Oncology [facility] 02/20/22 11:38 AM

## 2022-02-21 ENCOUNTER — Other Ambulatory Visit: Payer: Self-pay | Admitting: Radiation Therapy

## 2022-02-25 ENCOUNTER — Ambulatory Visit: Payer: Self-pay | Admitting: Rehabilitation

## 2022-02-26 ENCOUNTER — Other Ambulatory Visit: Payer: Self-pay

## 2022-02-26 ENCOUNTER — Ambulatory Visit
Admission: RE | Admit: 2022-02-26 | Discharge: 2022-02-26 | Disposition: A | Discharge status: Home / Self Care | Payer: Medicare Other | Source: Ambulatory Visit | Attending: Internal Medicine | Admitting: Internal Medicine

## 2022-02-26 ENCOUNTER — Ambulatory Visit: Payer: Medicare Other | Attending: Physician Assistant | Admitting: Physical Therapy

## 2022-02-26 ENCOUNTER — Encounter: Payer: Self-pay | Admitting: Physical Therapy

## 2022-02-26 DIAGNOSIS — M6281 Muscle weakness (generalized): Secondary | ICD-10-CM | POA: Diagnosis present

## 2022-02-26 DIAGNOSIS — M21371 Foot drop, right foot: Secondary | ICD-10-CM | POA: Insufficient documentation

## 2022-02-26 DIAGNOSIS — R2689 Other abnormalities of gait and mobility: Secondary | ICD-10-CM | POA: Insufficient documentation

## 2022-02-26 DIAGNOSIS — R2681 Unsteadiness on feet: Secondary | ICD-10-CM | POA: Diagnosis not present

## 2022-02-26 DIAGNOSIS — D420 Neoplasm of uncertain behavior of cerebral meninges: Secondary | ICD-10-CM

## 2022-02-26 MED ORDER — GADOBENATE DIMEGLUMINE 529 MG/ML IV SOLN
16.0000 mL | Freq: Once | INTRAVENOUS | Status: AC | PRN
  Administered 2022-02-26: 16 mL via INTRAVENOUS

## 2022-02-26 NOTE — Therapy (Signed)
[facility] [address] [place], [place], [address] Phone: [phone]   Fax:  [phone]  Physical Therapy Treatment  Patient Details  Name: James Olsen MRN: [id number] Date of Birth: 1946-11-04 Referring Provider (PT): Meredith Staggers, MD   Encounter Date: 02/26/2022   PT End of Session - 02/26/22 1325     Visit Number 31    Number of Visits 45   per updated POC   Date for PT Re-Evaluation 04/13/22   per updated POC   Authorization Type Medicare and [place] - 10th visit PN    Progress Note Due on Visit 22    PT Start Time 1318    PT Stop Time 1400    PT Time Calculation (min) 42 min    Equipment Utilized During Treatment Gait belt;Other (comment)   right AFO   Activity Tolerance Patient tolerated treatment well    Behavior During Therapy WFL for tasks assessed/performed             Past Medical History:  Diagnosis Date   Allergy    Arthritis    Cataract    GERD (gastroesophageal reflux disease)    Glaucoma    pt denies.    History of blood transfusion     5 units after previous surgery   History of radiation therapy 12/09/18- 01/24/2019   Left sagittal brain, 11.8 Gy in 31 fractions for a total dose of 55.8 Gy   Hyperlipidemia    Macular degeneration    Seizures ([place])    Stroke [facility])    after surgery in August 2019 mostly recovered still some weakness in left arm   Wears glasses     Past Surgical History:  Procedure Laterality Date   APPLICATION OF CRANIAL NAVIGATION N/A 06/21/2021   Procedure: APPLICATION OF CRANIAL NAVIGATION;  Surgeon: [name], MD;  Location: [facility];  Service: Neurosurgery;  Laterality: N/A;   BRAIN SURGERY     CHOLECYSTECTOMY  2008   lap choli   COLONOSCOPY     CRANIOPLASTY N/A 10/29/2018   Procedure: CRANIOPLASTY;  Surgeon: [name], MD;  Location: [facility] [facility];  Service: Neurosurgery;  Laterality: N/A;  CRANIOPLASTY   CRANIOTOMY N/A 08/18/2018   Procedure:  CRANIOTOMY TUMOR EXCISION;  Surgeon: [name], MD;  Location: [facility];  Service: Neurosurgery;  Laterality: N/A;  CRANIOTOMY TUMOR EXCISION   CRANIOTOMY Bilateral 06/21/2021   Procedure: Bilateral Craniotomy for tumor resection;  Surgeon: [name], MD;  Location: [facility];  Service: Neurosurgery;  Laterality: Bilateral;   DIAGNOSTIC LAPAROSCOPY     EYE SURGERY     laser surgery to relieve pressure   NAILBED REPAIR Left 05/04/2014   Procedure: LEFT INDEX NAIL ABLATION V-Y FLAP COVERAGE;  Surgeon: [name]., MD;  Location: [facility];  Service: Orthopedics;  Laterality: Left;  index   PILONIDAL CYST EXCISION     x2   TONSILLECTOMY     VASECTOMY      There were no vitals filed for this visit.   Subjective Assessment - 02/26/22 1327     Subjective Had MRI just before PT session today after seeing Dr. Mickeal Skinner last week due to worsening symptoms. Will talk to him tomorrow about results and then folllow up next week after the Tumor Board meets on Monday.    Pertinent History OA, h/o blood transfusion, h/o radiation therapy, macular degeneration, CVA, craniotomy > cranioplasty and resection of meningioma, R hemiparesis, focal seizures, urinary retention  Limitations Standing;Walking;House hold activities    Patient Stated Goals Walking, gain more strength in RLE to be able to drive in 6 months if seizure free    Currently in Pain? No/denies    Pain Score 0-No pain                   [facility] Adult PT Treatment/Exercise - 02/26/22 1326       Transfers   Transfers Sit to Stand;Stand to Sit    Sit to Stand 5: Supervision;With upper extremity assist;From chair/3-in-1;From bed    Stand to Sit 5: Supervision;With upper extremity assist;To bed;To chair/3-in-1      Ambulation/Gait   Ambulation/Gait Yes    Ambulation/Gait Assistance 5: Supervision;4: Min guard    Ambulation/Gait Assistance Details pt arrived from MRI without brace on. donned pt's brace to left LE  at registration chairs prior to gait into gym. Pt continues to need cues for increased left step length with foot scuffing at times and genu recurvatum noted. Knee control in stance appeared better after session with gait out to car. pt needed increased cues for foot clearance up uneven surfaces, improved with gait down uneven surfaces.    Ambulation Distance (Feet) 100 Feet   x1, 150 x1 indoor/outdoor, plus around clinic   Assistive device Rolling walker    Gait Pattern Step-through pattern;Decreased step length - right;Decreased arm swing - right;Decreased stride length;Decreased hip/knee flexion - right;Decreased dorsiflexion - right;Poor foot clearance - right    Ambulation Surface Level;Indoor      Self-Care   Self-Care Other Self-Care Comments    Other Self-Care Comments  discussed use of a stool in the bathroom to sit on for grooming/self care as pt reports it takes an hour just to shave and he is "done" after that. also discussed this may be handy to have if he's helping in the kitchen or with any other household tasks that involve standing. pt reports they have several stools in storage and can get one out to use.      Knee/Hip Exercises: Seated   Long Arc Quad AROM;AAROM;Strengthening;Right;2 sets;10 reps;Limitations    Long Arc Quad Limitations assist needed for full range and control as reps progresses, mostly with second set. rest break between each sets.    Other Seated Knee/Hip Exercises attempted with red band, then yellow band for hip fall out, pt unable to move LE out. attempted without any resistance with minimal to no movement noted so performed isometric hip abduction for 3-4 sec holds x 10 reps; pillow squeezes with bil LE's for 5 sec holds x 10 reps.    Hamstring Curl AROM;AAROM;Strengthening;Right;2 sets;10 reps;Limitations    Hamstring Limitations with foot on pillowcase sliding foot on floor with assist for increased flexion and controlled extension as pt fatigued.                  PT Short Term Goals - 02/12/22 1608       PT SHORT TERM GOAL #1   Title Pt will be IND with ongoing HEP in order to indicate improved functional mobility/balance.  (Target Date:03/14/22)    Time 4    Period Weeks    Status New    Target Date 03/14/22      PT SHORT TERM GOAL #2   Title Pt will demonstrate improved functional LE strength as indicated by ability to perform 10 reps of sit <> stand WITHOUT use of UE in 30 seconds    Time 4  Period Weeks    Status New      PT SHORT TERM GOAL #3   Title Will improve Mini Best by 4 points in order to indicate improved balance and mobility.    Baseline 16/28 on last assessment    Time 4    Period Weeks    Status New      PT SHORT TERM GOAL #4   Title Pt will improve gait velocity with RW to >/= 2.62 ft/sec    Baseline 2.32 ft/sec on 2/22    Time 4    Period Weeks    Status New      PT SHORT TERM GOAL #5   Title Pt will ambulate with quad tip cane x 50' over level indoor surfaces with min/guard in order to increase independence at home.    Time 4    Period Weeks    Status New               PT Long Term Goals - 02/12/22 1620       PT LONG TERM GOAL #1   Title Pt will demonstrate independence with final HEP (Target Date: 04/13/22)    Time 8    Period Weeks    Status On-going    Target Date 04/13/22      PT LONG TERM GOAL #2   Title Pt will demonstrate gait velocity of 2.0 ft/sec or better with quad tip cane and AFO in order to indicate dec fall risk.    Baseline 2.32 ft/sec on 2/22 with RW and AFO    Time 8    Period Weeks    Status Revised      PT LONG TERM GOAL #3   Title Pt will increase score on Mini-Best test by 4 points to indicate decreased falls risk    Time 8    Period Weeks    Status On-going      PT LONG TERM GOAL #4   Title Pt will ambulate x 250' outside over uneven pavement, negotiate ramp, curb and 12 stairs with AFO and CANE, S level.    Baseline Was able to ambulate 500' with  min/guard A at times with AFO and RW    Time 8    Period Weeks    Status Revised                   Plan - 02/26/22 1326     Clinical Impression Statement Today's skilled session continued to focus on general activity tolerance, right LE strengthening and gait with RW. Pt continues to need short rest breaks due to fatigue. No other issues noted or reported in session. The pt is progressing toward goals and should benefit from continued PT to progress toward unmet goals.    Personal Factors and Comorbidities Comorbidity 3+;Past/Current Experience;Social Background;Transportation    Comorbidities OA, h/o blood transfusion, h/o radiation therapy, macular degeneration, CVA, craniotomy > cranioplasty and resection of meningioma, R hemiparesis, focal seizures, urinary retention    Examination-Activity Limitations Locomotion Level;Stairs;Stand    Examination-Participation Restrictions Community Activity;Driving;Yard Work    [employer] Evolving/Moderate complexity    Rehab Potential Good    PT Frequency 2x / week    PT Duration 8 weeks    PT Treatment/Interventions ADLs/Self Care Home Management;Aquatic Therapy;DME Instruction;Gait training;Stair training;Functional mobility training;Therapeutic activities;Therapeutic exercise;Balance training;Neuromuscular re-education;Patient/family education;Passive range of motion    PT Next Visit Plan No estim due to CA and seizures, no aquatics due to seizures.  continue to work on strengthening, activity tolerance and progress to balance with decreased UE support.    PT Home Exercise Plan Access Code: [email or web address]    Consulted and Agree with Plan of Care Patient             Patient will benefit from skilled therapeutic intervention in order to improve the following deficits and impairments:  Abnormal gait, Decreased activity tolerance, Decreased balance, Decreased strength, Difficulty walking, Decreased coordination, Decreased  mobility, Impaired sensation, Postural dysfunction, Impaired flexibility, Decreased endurance  Visit Diagnosis: Unsteadiness on feet  Muscle weakness (generalized)  Foot drop, right  Other abnormalities of gait and mobility     Problem List Patient Active Problem List   Diagnosis Date Noted   Spasticity 09/11/2021   Urinary retention 07/17/2021   S/P resection of meningioma 06/26/2021   S/P craniotomy 06/21/2021   Focal seizures ([place]) 05/06/2021   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   Other abnormalities of gait and mobility 09/17/2018   Acute cerebral infarction ([facility])    Thrombocytopenia ([facility])    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 ([place])    Acute blood loss anemia    Generalized OA    Gastroesophageal reflux disease    Seizure prophylaxis    Leukocytosis    Tumor 08/18/2018   MACULAR DEGENERATION 02/29/2008   ALLERGY 02/29/2008   Nonspecific (abnormal) findings on radiological and other examination of body structure 12/26/2007   NONSPECIFIC ABNORM FIND RAD&OTH EXAM LUNG FIELD 12/26/2007   GALLSTONES 10/01/2007   ELEVATION, TRANSAMINASE/LDH LEVELS 09/06/2007   HEPATITIS NOS 09/01/2007   ABDOMINAL PAIN, EPIGASTRIC 09/01/2007   Willow Ora, PTA, [facility] [address] [place], [place] [address] [phone] 02/26/22, 4:21 PM   Name: James Olsen MRN: [id number] Date of Birth: 11/03/1946

## 2022-02-27 ENCOUNTER — Inpatient Hospital Stay (HOSPITAL_BASED_OUTPATIENT_CLINIC_OR_DEPARTMENT_OTHER): Payer: Medicare Other | Admitting: Internal Medicine

## 2022-02-27 ENCOUNTER — Ambulatory Visit (INDEPENDENT_AMBULATORY_CARE_PROVIDER_SITE_OTHER): Payer: Medicare Other | Admitting: Physician Assistant

## 2022-02-27 VITALS — BP 114/73 | HR 74 | Temp 97.0°F | Ht 69.0 in | Wt 180.4 lb

## 2022-02-27 DIAGNOSIS — D420 Neoplasm of uncertain behavior of cerebral meninges: Secondary | ICD-10-CM | POA: Diagnosis not present

## 2022-02-27 DIAGNOSIS — R7303 Prediabetes: Secondary | ICD-10-CM

## 2022-02-27 DIAGNOSIS — R569 Unspecified convulsions: Secondary | ICD-10-CM | POA: Diagnosis not present

## 2022-02-27 DIAGNOSIS — D329 Benign neoplasm of meninges, unspecified: Secondary | ICD-10-CM

## 2022-02-27 LAB — POCT GLYCOSYLATED HEMOGLOBIN (HGB A1C): Hemoglobin A1C: 6.7 % — AB (ref 4.0–5.6)

## 2022-02-27 NOTE — Progress Notes (Signed)
I connected with James Olsen on 02/27/22 at  2:30 PM EST by telephone visit and verified that I am speaking with the correct person using two identifiers.  ?I discussed the limitations, risks, security and privacy concerns of performing an evaluation and management service by telemedicine and the availability of in-person appointments. I also discussed with the patient that there may be a patient responsible charge related to this service. The patient expressed understanding and agreed to proceed.  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 ?Focal seizures ([place]) ? ?History of Present Ilness: James Olsen is clinically unchanged today, now on '4mg'$  on decadron.  Continues to describe right leg weakness and gait impairment that is clearly progressed from 2 months prior.  Fortunately no further seizures are noted.  Side effects from decadron include sleep impairment, nocturia, irritability, increased appetite.   ?Observations: Language and cognition at baseline ? ?Imaging: ? ?[place] Clinician Interpretation: I have personally reviewed the CNS images as listed.  My interpretation, in the context of the patient's clinical presentation, is likely treatment effect ? ?CT Head Wo Contrast ? ?Result Date: 02/11/2022 ?CLINICAL DATA:  History of brain mass EXAM: CT HEAD WITHOUT CONTRAST TECHNIQUE: Contiguous axial images were obtained from the base of the skull through the vertex without intravenous contrast. RADIATION DOSE REDUCTION: This exam was performed according to the departmental dose-optimization program which includes automated exposure control, adjustment of the mA and/or kV according to patient size and/or use of iterative reconstruction technique. COMPARISON:  MRI 12/04/2021, CT brain 06/22/2021, MRI 02/03/2018, 11/09/2021, 08/18/2021 FINDINGS: Brain: No  acute territorial infarction or hemorrhage is visualized. Encephalomalacia within the left frontal lobe and right cranial vertex. Isodense left posterior frontal convexity mass, this measures approximately 19 by 12 mm though better seen on MRI. Redemonstrated extracranial and intracranial mass at the vertex near the craniotomy site. On sagittal images, the extracranial portion of the mass extending into the scalp measures approximately 2.8 cm AP by 0.7 cm thick. Intracranial portion of the mass measures approximately 18 mm AP by 9 mm thick. This is grossly stable as compared with the most recent MRI from December. The ventricles are stable in size. Patchy periventricular white matter hypodensity could relate to chronic small vessel ischemic change. Vascular: No hyperdense vessels.  No unexpected calcification Skull: High frontal parietal craniotomy.  No fracture Sinuses/Orbits: No acute finding. Other: None IMPRESSION: 1. Status post frontal parietal craniotomy. Grossly similar size of the intra and extracranial mass/meningioma at the vertex since the most recent MRI from December, allowing for limitations of non contrasted CT exam. Grossly stable size of left frontal convexity mass/meningioma. Encephalomalacia within the left frontal lobe and right cranial vertex. 2. Patchy white matter hypodensity consistent with chronic small vessel ischemic change. Electronically Signed   By: Donavan Foil M.D.   On: 02/11/2022 22:07  ? ?MR BRAIN W WO CONTRAST ? ?Result Date: 02/26/2022 ?CLINICAL DATA:  Brain/CNS neoplasm, assess treatment response; meningioma post resection and radiation EXAM: MRI HEAD WITHOUT AND WITH CONTRAST TECHNIQUE: Multiplanar, multiecho pulse sequences of the brain and surrounding structures were obtained without and with intravenous contrast. CONTRAST:  57m MULTIHANCE GADOBENATE DIMEGLUMINE 529 MG/ML IV SOLN COMPARISON:  12/04/2021 FINDINGS: Brain: Postsurgical changes at the vertex with underlying  dural based enhancement as before. The nodular enhancement along the high right frontal convexity (series  13, image 22) has minimally decreased in size. Other areas of thickening underlying the craniotomy do not appear substantially changed. Increased T2 FLAIR hyperintensity in the left greater than right frontoparietal white matter is probably related to treatment. The left posterior frontal convexity meningioma has decreased in size (series 12, image 120), now measuring 1.7 x 1.2 cm axially (previously 1.9 x 1.5 cm). There is no acute infarction or intracranial hemorrhage. Ventricles are stable in size. Additional patchy foci of T2 hyperintensity in the supratentorial white matter are nonspecific but probably reflect chronic microvascular ischemic changes. Vascular: Major vessel flow voids at the skull base are preserved. Skull and upper cervical spine: Normal marrow signal is preserved. Sinuses/Orbits: Minor mucosal thickening.  Orbits are unremarkable. Other: Sella is unremarkable.  Mastoid air cells are clear. IMPRESSION: Minimal decrease in size of nodular enhancement along the high right frontal convexity underlying the craniotomy. Other areas of dural thickening/enhancement underlying the craniotomy do not appear substantially changed. Increased T2 FLAIR hyperintensity likely related to treatment. Decrease in size of posterior left frontal convexity meningioma. Electronically Signed   By: Macy Mis M.D.   On: 02/26/2022 13:34   ? 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 ?Focal seizures ([place]) ? ?Clinically still declined with regards to right sided motor function.  Brain MRI demonstrates stability of neoplasm but clear progression of inflammation affecting the left frontal lobe, pre-central gyrus.  Etiology of inflammation is repeat exposure to radiation January 2023.   ? ?We discussed short term trial of higher dose decadon '12mg'$  daily.  If not clearly improved, we will recommend  transition to second line therapy with avastin '10mg'$ /kg IV q3 weeks x4 cycles.  He will do his best to manage side effects in the interim, as discussed.  He is agreeable with this plan.   ?  ?Follow Up Instructions: Will give him a call in 5 days to assess response to this intervention. ? ?I discussed the assessment and treatment plan with the patient.  The patient was provided an opportunity to ask questions and all were answered.  The patient agreed with the plan and demonstrated understanding of the instructions.   ? ?The patient was advised to call back or seek an in-person evaluation if the symptoms worsen or if the condition fails to improve as anticipated.  I provided 5-10 minutes of non-face-to-face time during this enocunter. ? ?[name], MD ? ? ?I provided 22 minutes of non face-to-face telephone visit time during this encounter, and > 50% was spent counseling as documented under my assessment & plan.  ? ? ?

## 2022-02-27 NOTE — Progress Notes (Signed)
? ?Subjective:  ? ? Patient ID: James Olsen, male    DOB: [birth year], 76 y.o.   MRN: [id number] ? ?Chief Complaint  ?Patient presents with  ? Follow-up  ? ? ?HPI ?Patient with hx atypical meningioma and prediabetes is in today for overall follow up and recheck HA1c. ? ?He had a focal seizure and went to [facility] ED on 02/11/22.  ?Keppra dose was increased to 1000 mg BID. Still taking Decadron 4 mg daily. No further seizures.  ? ?Last two nights has gotten a reasonable amount of rest he thinks, but still feeling some restlessness and anxiousness from the Decadron. ? ?Weak, dizziness, dragging his foot. He has been doing PT and felt like he was making progress initially, but now feels like he has a magnet on his right foot and still having heaviness, dragging in this foot. Usually goes twice per week to neurorehab.  He has follow-up with Dr. Mickeal Skinner today. ? ?States that he has been hungrier than as well because of the Decadron and has been snacking often.  He has not been choosing the healthiest of foods. ? ?No other concerns or complaints today. ? ?Past Medical History:  ?Diagnosis Date  ? Allergy   ? Arthritis   ? Cataract   ? GERD (gastroesophageal reflux disease)   ? Glaucoma   ? pt denies.   ? History of blood transfusion   ?  5 units after previous surgery  ? History of radiation therapy 12/09/18- 01/24/2019  ? Left sagittal brain, 11.8 Gy in 31 fractions for a total dose of 55.8 Gy  ? Hyperlipidemia   ? Macular degeneration   ? Seizures ([place])   ? Stroke [facility])   ? after surgery in August 2019 mostly recovered still some weakness in left arm  ? Wears glasses   ? ? ?Past Surgical History:  ?Procedure Laterality Date  ? APPLICATION OF CRANIAL NAVIGATION N/A 06/21/2021  ? Procedure: APPLICATION OF CRANIAL NAVIGATION;  Surgeon: [name], MD;  Location: [place];  Service: Neurosurgery;  Laterality: N/A;  ? BRAIN SURGERY    ? CHOLECYSTECTOMY  2008  ? lap choli  ? COLONOSCOPY    ? CRANIOPLASTY N/A 10/29/2018  ?  Procedure: CRANIOPLASTY;  Surgeon: [name], MD;  Location: [place];  Service: Neurosurgery;  Laterality: N/A;  CRANIOPLASTY  ? CRANIOTOMY N/A 08/18/2018  ? Procedure: CRANIOTOMY TUMOR EXCISION;  Surgeon: [name], MD;  Location: [place];  Service: Neurosurgery;  Laterality: N/A;  CRANIOTOMY TUMOR EXCISION  ? CRANIOTOMY Bilateral 06/21/2021  ? Procedure: Bilateral Craniotomy for tumor resection;  Surgeon: [name], MD;  Location: [place];  Service: Neurosurgery;  Laterality: Bilateral;  ? DIAGNOSTIC LAPAROSCOPY    ? EYE SURGERY    ? laser surgery to relieve pressure  ? NAILBED REPAIR Left 05/04/2014  ? Procedure: LEFT INDEX NAIL ABLATION V-Y FLAP COVERAGE;  Surgeon: [name]., MD;  Location: [place];  Service: Orthopedics;  Laterality: Left;  index  ? PILONIDAL CYST EXCISION    ? x2  ? TONSILLECTOMY    ? VASECTOMY    ? ? ?Family History  ?Problem Relation Age of Onset  ? Diabetes Mother   ? Skin cancer Brother   ? Congenital heart disease Brother   ? ? ?Social History  ? ?Tobacco Use  ? Smoking status: Never  ? Smokeless tobacco: Never  ?Vaping Use  ? Vaping Use: Never used  ?Substance Use Topics  ? Alcohol use: Yes  ?  Comment: occasionally  ? Drug use: No  ?  ? ?No Known Allergies ? ?Review of Systems ?NEGATIVE UNLESS OTHERWISE INDICATED IN HPI ? ? ?   ?Objective:  ?  ? ?BP 114/73   Pulse 74   Temp (!) 97 ?F (36.1 ?C)   Ht '5\' 9"'$  (1.753 m)   Wt 180 lb 6.1 oz (81.8 kg)   SpO2 97%   BMI 26.64 kg/m?  ? ?Wt Readings from Last 3 Encounters:  ?02/27/22 180 lb 6.1 oz (81.8 kg)  ?02/20/22 182 lb 9 oz (82.8 kg)  ?02/11/22 187 lb 6.3 oz (85 kg)  ? ? ?BP Readings from Last 3 Encounters:  ?02/27/22 114/73  ?02/20/22 117/74  ?02/11/22 (!) 132/93  ?  ? ?Physical Exam ?Vitals and nursing note reviewed.  ?Constitutional:   ?   General: He is not in acute distress. ?   Appearance: Normal appearance. He is not toxic-appearing.  ?   Comments: Using a walker  ?HENT:  ?   Head: Normocephalic and  atraumatic.  ?   Comments: Meningioma noted on vertex ?   Right Ear: External ear normal.  ?   Left Ear: External ear normal.  ?   Nose: Nose normal.  ?   Mouth/Throat:  ?   Mouth: Mucous membranes are moist.  ?   Pharynx: Oropharynx is clear.  ?Eyes:  ?   Extraocular Movements: Extraocular movements intact.  ?   Conjunctiva/sclera: Conjunctivae normal.  ?   Pupils: Pupils are equal, round, and reactive to light.  ?Cardiovascular:  ?   Rate and Rhythm: Normal rate and regular rhythm.  ?   Pulses: Normal pulses.  ?   Heart sounds: Normal heart sounds.  ?Pulmonary:  ?   Effort: Pulmonary effort is normal.  ?   Breath sounds: Normal breath sounds.  ?Musculoskeletal:     ?   General: Normal range of motion.  ?   Cervical back: Normal range of motion and neck supple.  ?Skin: ?   General: Skin is warm and dry.  ?Neurological:  ?   General: No focal deficit present.  ?   Mental Status: He is alert and oriented to person, place, and time.  ?   Motor: Weakness (RLE) present.  ?Psychiatric:     ?   Mood and Affect: Mood normal.     ?   Behavior: Behavior normal.  ? ? ?   ?Assessment & Plan:  ? ?Problem List Items Addressed This Visit   ?None ?Visit Diagnoses   ? ? Prediabetes    -  Primary  ? Relevant Orders  ? POCT HgB A1C (Completed)  ? Meningioma ([place])      ? ?  ? ?1. Prediabetes ?Lab Results  ?Component Value Date  ? HGBA1C 6.7 (A) 02/27/2022  ? ?Point-of-care A1c in office today is 6.7, which is elevated from his previous reading of 5.8 approximately 3 months ago.  Discussed with patient this could be as a result of that he daily dexamethasone he has been taking.  He has also been not as mindful of his dietary habits.  Plan to have tighter dietary control including reducing simple carbs and we can recheck his A1c in 3 months. ? ?2. Meningioma ([place]) ?He had MRI of the brain with and without contrast yesterday and is scheduled to have a follow-up visit with Dr. Mickeal Skinner to discuss findings.  He will continue PT and he will  continue his medications including Keppra and Decadron as  directed. ? ? ? ?This note was prepared with assistance of Systems analyst. Occasional wrong-word or sound-a-like substitutions may have occurred due to the inherent limitations of voice recognition software. ? ? ?Jiovanny Burdell M Kellina Dreese, PA-C ?

## 2022-03-03 ENCOUNTER — Inpatient Hospital Stay: Payer: Medicare Other

## 2022-03-04 ENCOUNTER — Other Ambulatory Visit: Payer: Self-pay | Admitting: Internal Medicine

## 2022-03-04 ENCOUNTER — Inpatient Hospital Stay (HOSPITAL_BASED_OUTPATIENT_CLINIC_OR_DEPARTMENT_OTHER): Payer: Medicare Other | Admitting: Internal Medicine

## 2022-03-04 ENCOUNTER — Ambulatory Visit: Payer: Self-pay | Admitting: Rehabilitation

## 2022-03-04 DIAGNOSIS — D420 Neoplasm of uncertain behavior of cerebral meninges: Secondary | ICD-10-CM

## 2022-03-04 DIAGNOSIS — I6789 Other cerebrovascular disease: Secondary | ICD-10-CM | POA: Diagnosis not present

## 2022-03-04 DIAGNOSIS — Y842 Radiological procedure and radiotherapy as the cause of abnormal reaction of the patient, or of later complication, without mention of misadventure at the time of the procedure: Secondary | ICD-10-CM | POA: Diagnosis not present

## 2022-03-04 NOTE — Progress Notes (Signed)
I connected with James Olsen on 03/04/22 at  9:30 AM EDT by telephone visit and verified that I am speaking with the correct person using two identifiers.  ?I discussed the limitations, risks, security and privacy concerns of performing an evaluation and management service by telemedicine and the availability of in-person appointments. I also discussed with the patient that there may be a patient responsible charge related to this service. The patient expressed understanding and agreed to proceed.  ?Other persons participating in the visit and their role in the encounter:  n/a  ?Patient's location:  Home  ?Provider's location:  Office  ?Chief Complaint:  Radiation therapy induced brain necrosis - Plan: CBC with Differential ([place] Only), Total Protein, Urine dipstick ? 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 ?History of Present Ilness: James Olsen describes minimal to no improvement in his right sided weakness, dizziness, cognitive slowing since increasing the decadron to '12mg'$  daily as discussed.  He continues to experience side effects of the steroids, including high blood sugar, insomnia, swelling in legs/face.   ?Observations: Language and cognition at baseline ? ?Assessment and Plan: Radiation therapy induced brain necrosis - Plan: CBC with Differential ([place] Only), Total Protein, Urine dipstick ? 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 ? ?James Olsen presents with clinical syndrome, secondary to post-radiation inflammatory syndrome, which is refractory now to high dose corticosteroids.   ? ?We recommended initiating second line treatment with avastin '10mg'$ /kg, IV q3 weeks x4 cycles.  We reviewed side effects including hypertension, bleeding/clotting events, impaired wound healing. ? ?Informed consent was obtained  verbally to proceed with avastin. ? ?Avastin should be held for the following: ?? ANC less than 500 ?? Platelets less than 50,000 ?? LFT or creatinine greater than 2x ULN ?? If clinical concerns/contraindications develop ? ?In meantime, he will dose reduce decadron to '8mg'$  if able. ? ?Follow Up Instructions: RTC for cycle #1 avastin once scheduled ? ?I discussed the assessment and treatment plan with the patient.  The patient was provided an opportunity to ask questions and all were answered.  The patient agreed with the plan and demonstrated understanding of the instructions.   ? ?The patient was advised to call back or seek an in-person evaluation if the symptoms worsen or if the condition fails to improve as anticipated.  I provided 5-10 minutes of non-face-to-face time during this enocunter. ? ?[name], MD ? ? ?I provided 22 minutes of non face-to-face telephone visit time during this encounter, and > 50% was spent counseling as documented under my assessment & plan.  ? ?

## 2022-03-05 ENCOUNTER — Telehealth: Payer: Self-pay

## 2022-03-05 ENCOUNTER — Encounter: Payer: Self-pay | Admitting: Internal Medicine

## 2022-03-05 ENCOUNTER — Telehealth: Payer: Self-pay | Admitting: Internal Medicine

## 2022-03-05 NOTE — Telephone Encounter (Signed)
Received my chart message regarding appointments in April. Upcoming appointments reviewed with patient. No further questions. ?

## 2022-03-05 NOTE — Telephone Encounter (Signed)
Scheduled per 3/14 los, message has been left with pt ?

## 2022-03-06 ENCOUNTER — Encounter: Payer: Self-pay | Admitting: Rehabilitation

## 2022-03-06 ENCOUNTER — Other Ambulatory Visit: Payer: Self-pay

## 2022-03-06 ENCOUNTER — Ambulatory Visit: Payer: Medicare Other | Admitting: Internal Medicine

## 2022-03-06 ENCOUNTER — Ambulatory Visit: Payer: Medicare Other | Admitting: Rehabilitation

## 2022-03-06 DIAGNOSIS — R2681 Unsteadiness on feet: Secondary | ICD-10-CM | POA: Diagnosis not present

## 2022-03-06 NOTE — Patient Instructions (Signed)
Access Code: [email or web address] ?URL: [email or web address] ?Date: 03/06/2022 ?Prepared by: Cameron Sprang ? ?Exercises ?Modified Thomas Stretch - 1 x daily - 5 x weekly - 1 sets - 10 reps ?Supine Bridge - 1-2 x daily - 7 x weekly - 1 sets - 10 reps ?Seated Knee Flexion Extension AAROM with Overpressure - 1 x daily - 7 x weekly - 1 sets - 10 reps ?Supine Hip Abduction - 1 x daily - 7 x weekly - 1 sets - 10 reps ?Supine Heel Slide - 1 x daily - 7 x weekly - 1 sets - 10 reps ?Supine Gluteal Sets - 1 x daily - 7 x weekly - 1 sets - 10 reps ? ?

## 2022-03-06 NOTE — Therapy (Signed)
[facility] ?[place], [place], [address] ?Phone: [phone]   Fax:  [phone] ? ?Physical Therapy Treatment and DC Summary  ? ?Patient Details  ?Name: James Olsen ?MRN: [id number] ?Date of Birth: [birth year] ?Referring Provider (PT): Meredith Staggers, MD ? ? ?Encounter Date: 03/06/2022 ? ? PT End of Session - 03/06/22 1328   ? ? Visit Number 32   ? Number of Visits 45   per updated POC  ? Date for PT Re-Evaluation 04/13/22   per updated POC  ? Authorization Type Medicare and AARP - 10th visit PN   ? Progress Note Due on Visit 39   ? PT Start Time 1145   ? PT Stop Time 1230   ? PT Time Calculation (min) 45 min   ? Equipment Utilized During Treatment Gait belt;Other (comment)   right AFO  ? Activity Tolerance Patient tolerated treatment well   ? Behavior During Therapy [facility] for tasks assessed/performed   ? ?  ?  ? ?  ? ? ?Past Medical History:  ?Diagnosis Date  ? Allergy   ? Arthritis   ? Cataract   ? GERD (gastroesophageal reflux disease)   ? Glaucoma   ? pt denies.   ? History of blood transfusion   ?  5 units after previous surgery  ? History of radiation therapy 12/09/18- 01/24/2019  ? Left sagittal brain, 11.8 Gy in 31 fractions for a total dose of 55.8 Gy  ? Hyperlipidemia   ? Macular degeneration   ? Seizures ([place])   ? Stroke [facility])   ? after surgery in August 2019 mostly recovered still some weakness in left arm  ? Wears glasses   ? ? ?Past Surgical History:  ?Procedure Laterality Date  ? APPLICATION OF CRANIAL NAVIGATION N/A 06/21/2021  ? Procedure: APPLICATION OF CRANIAL NAVIGATION;  Surgeon: [name], MD;  Location: [place];  Service: Neurosurgery;  Laterality: N/A;  ? BRAIN SURGERY    ? CHOLECYSTECTOMY  2008  ? lap choli  ? COLONOSCOPY    ? CRANIOPLASTY N/A 10/29/2018  ? Procedure: CRANIOPLASTY;  Surgeon: [name], MD;  Location: [place];  Service: Neurosurgery;  Laterality: N/A;  CRANIOPLASTY  ? CRANIOTOMY N/A 08/18/2018  ?  Procedure: CRANIOTOMY TUMOR EXCISION;  Surgeon: [name], MD;  Location: [place];  Service: Neurosurgery;  Laterality: N/A;  CRANIOTOMY TUMOR EXCISION  ? CRANIOTOMY Bilateral 06/21/2021  ? Procedure: Bilateral Craniotomy for tumor resection;  Surgeon: [name], MD;  Location: [place];  Service: Neurosurgery;  Laterality: Bilateral;  ? DIAGNOSTIC LAPAROSCOPY    ? EYE SURGERY    ? laser surgery to relieve pressure  ? NAILBED REPAIR Left 05/04/2014  ? Procedure: LEFT INDEX NAIL ABLATION V-Y FLAP COVERAGE;  Surgeon: [name]., MD;  Location: [place];  Service: Orthopedics;  Laterality: Left;  index  ? PILONIDAL CYST EXCISION    ? x2  ? TONSILLECTOMY    ? VASECTOMY    ? ? ?There were no vitals filed for this visit. ? ? Subjective Assessment - 03/06/22 1329   ? ? Subjective Pt reports wanting to go on hold due to getting infusions (3-4x/wk for 4 weeks) and then to have MRI to determine next steps.   ? Pertinent History OA, h/o blood transfusion, h/o radiation therapy, macular degeneration, CVA, craniotomy > cranioplasty and resection of meningioma, R hemiparesis, focal seizures, urinary retention   ? Limitations Standing;Walking;House hold activities   ?  Patient Stated Goals Walking, gain more strength in RLE to be able to drive in 6 months if seizure free   ? Currently in Pain? No/denies   ? ?  ?  ? ?  ? ? ? ? ? ? ? ? ? ? ? ? ? ? ? ? ? ? ? ?Access Code: [email or web address] ?URL: [email or web address] ?Date: 03/06/2022 ?Prepared by: Cameron Sprang ?  ?Exercises ?Modified Thomas Stretch - 1 x daily - 5 x weekly - 1 sets - 10 reps ?Supine Bridge - 1-2 x daily - 7 x weekly - 1 sets - 10 reps ?Seated Knee Flexion Extension AAROM with Overpressure - 1 x daily - 7 x weekly - 1 sets - 10 reps ?Supine Hip Abduction - 1 x daily - 7 x weekly - 1 sets - 10 reps ?Supine Heel Slide - 1 x daily - 7 x weekly - 1 sets - 10 reps ?Supine Gluteal Sets - 1 x daily - 7 x weekly - 1 sets - 10  reps ? ? ? ? ? ? ? ? ? ? ? PT Short Term Goals - 02/12/22 1608   ? ?  ? PT SHORT TERM GOAL #1  ? Title Pt will be IND with ongoing HEP in order to indicate improved functional mobility/balance.  (Target Date:03/14/22)   ? Time 4   ? Period Weeks   ? Status New   ? Target Date 03/14/22   ?  ? PT SHORT TERM GOAL #2  ? Title Pt will demonstrate improved functional LE strength as indicated by ability to perform 10 reps of sit <> stand WITHOUT use of UE in 30 seconds   ? Time 4   ? Period Weeks   ? Status New   ?  ? PT SHORT TERM GOAL #3  ? Title Will improve Mini Best by 4 points in order to indicate improved balance and mobility.   ? Baseline 16/28 on last assessment   ? Time 4   ? Period Weeks   ? Status New   ?  ? PT SHORT TERM GOAL #4  ? Title Pt will improve gait velocity with RW to >/= 2.62 ft/sec   ? Baseline 2.32 ft/sec on 2/22   ? Time 4   ? Period Weeks   ? Status New   ?  ? PT SHORT TERM GOAL #5  ? Title Pt will ambulate with quad tip cane x 50' over level indoor surfaces with min/guard in order to increase independence at home.   ? Time 4   ? Period Weeks   ? Status New   ? ?  ?  ? ?  ? ? ? ? PT Long Term Goals - 02/12/22 1620   ? ?  ? PT LONG TERM GOAL #1  ? Title Pt will demonstrate independence with final HEP (Target Date: 04/13/22)   ? Time 8   ? Period Weeks   ? Status On-going   ? Target Date 04/13/22   ?  ? PT LONG TERM GOAL #2  ? Title Pt will demonstrate gait velocity of 2.0 ft/sec or better with quad tip cane and AFO in order to indicate dec fall risk.   ? Baseline 2.32 ft/sec on 2/22 with RW and AFO   ? Time 8   ? Period Weeks   ? Status Revised   ?  ? PT LONG TERM GOAL #3  ? Title Pt will increase score on Mini-Best test by 4 points  to indicate decreased falls risk   ? Time 8   ? Period Weeks   ? Status On-going   ?  ? PT LONG TERM GOAL #4  ? Title Pt will ambulate x 250' outside over uneven pavement, negotiate ramp, curb and 12 stairs with AFO and CANE, S level.   ? Baseline Was able to ambulate  500' with min/guard A at times with AFO and RW   ? Time 8   ? Period Weeks   ? Status Revised   ? ?  ?  ? ?  ? ? ? ?PHYSICAL THERAPY DISCHARGE SUMMARY ? ?Visits from Start of Care: 32 ? ?Current functional level related to goals / functional outcomes: ?Did not assess LTGs due to marked decline with recent tumor growth.  ?  ?Remaining deficits: ?Marked decline in function and RUE/LE weakness.  ?  ?Education / Equipment: ?HEP   ? ?Patient agrees to discharge. Patient goals were not met. Patient is being discharged due to a change in medical status. ? ? ? ? ? Plan - 03/06/22 1331   ? ? Clinical Impression Statement Skilled session focused on downgrading HEP so that he can have appropriate strengthening while taking break from PT.  Discussed having pt get new PT orders from Dr. Mickeal Skinner when ready to return in order to "start fresh" and re-establish goals.   ? Personal Factors and Comorbidities Comorbidity 3+;Past/Current Experience;Social Background;Transportation   ? Comorbidities OA, h/o blood transfusion, h/o radiation therapy, macular degeneration, CVA, craniotomy > cranioplasty and resection of meningioma, R hemiparesis, focal seizures, urinary retention   ? Examination-Activity Limitations Locomotion Level;Stairs;Stand   ? Examination-Participation Restrictions Community Activity;Driving;[employer] [employer] Work   ? Stability/Clinical Decision Making Evolving/Moderate complexity   ? Rehab Potential Good   ? PT Frequency 2x / week   ? PT Duration 8 weeks   ? PT Treatment/Interventions ADLs/Self Care Home Management;Aquatic Therapy;DME Instruction;Gait training;Stair training;Functional mobility training;Therapeutic activities;Therapeutic exercise;Balance training;Neuromuscular re-education;Patient/family education;Passive range of motion   ? PT Next Visit Plan re-eval   ? PT Home Exercise Plan Access Code: [email or web address]   ? Consulted and Agree with Plan of Care Patient   ? ?  ?  ? ?  ? ? ?Patient will benefit from skilled therapeutic  intervention in order to improve the following deficits and impairments:  Abnormal gait, Decreased activity tolerance, Decreased balance, Decreased strength, Difficulty walking, Decreased coordination, Decreased mobili

## 2022-03-07 ENCOUNTER — Encounter: Payer: Self-pay | Admitting: Internal Medicine

## 2022-03-11 ENCOUNTER — Ambulatory Visit: Payer: Medicare Other | Admitting: Physical Therapy

## 2022-03-11 ENCOUNTER — Inpatient Hospital Stay: Payer: Medicare Other

## 2022-03-11 ENCOUNTER — Inpatient Hospital Stay (HOSPITAL_BASED_OUTPATIENT_CLINIC_OR_DEPARTMENT_OTHER): Payer: Medicare Other | Admitting: Internal Medicine

## 2022-03-11 ENCOUNTER — Other Ambulatory Visit: Payer: Self-pay

## 2022-03-11 VITALS — BP 121/75 | HR 88 | Temp 98.2°F | Resp 18 | Wt 183.4 lb

## 2022-03-11 DIAGNOSIS — R569 Unspecified convulsions: Secondary | ICD-10-CM

## 2022-03-11 DIAGNOSIS — D420 Neoplasm of uncertain behavior of cerebral meninges: Secondary | ICD-10-CM

## 2022-03-11 DIAGNOSIS — Y842 Radiological procedure and radiotherapy as the cause of abnormal reaction of the patient, or of later complication, without mention of misadventure at the time of the procedure: Secondary | ICD-10-CM

## 2022-03-11 DIAGNOSIS — I6789 Other cerebrovascular disease: Secondary | ICD-10-CM

## 2022-03-11 DIAGNOSIS — Z5112 Encounter for antineoplastic immunotherapy: Secondary | ICD-10-CM | POA: Diagnosis not present

## 2022-03-11 LAB — CBC WITH DIFFERENTIAL (CANCER CENTER ONLY)
Abs Immature Granulocytes: 0.6 10*3/uL — ABNORMAL HIGH (ref 0.00–0.07)
Basophils Absolute: 0 10*3/uL (ref 0.0–0.1)
Basophils Relative: 0 %
Eosinophils Absolute: 0 10*3/uL (ref 0.0–0.5)
Eosinophils Relative: 0 %
HCT: 44.7 % (ref 39.0–52.0)
Hemoglobin: 15.4 g/dL (ref 13.0–17.0)
Immature Granulocytes: 4 %
Lymphocytes Relative: 3 %
Lymphs Abs: 0.5 10*3/uL — ABNORMAL LOW (ref 0.7–4.0)
MCH: 30.5 pg (ref 26.0–34.0)
MCHC: 34.5 g/dL (ref 30.0–36.0)
MCV: 88.5 fL (ref 80.0–100.0)
Monocytes Absolute: 0.5 10*3/uL (ref 0.1–1.0)
Monocytes Relative: 3 %
Neutro Abs: 12.7 10*3/uL — ABNORMAL HIGH (ref 1.7–7.7)
Neutrophils Relative %: 90 %
Platelet Count: 156 10*3/uL (ref 150–400)
RBC: 5.05 MIL/uL (ref 4.22–5.81)
RDW: 14.9 % (ref 11.5–15.5)
WBC Count: 14.2 10*3/uL — ABNORMAL HIGH (ref 4.0–10.5)
nRBC: 0 % (ref 0.0–0.2)

## 2022-03-11 LAB — TOTAL PROTEIN, URINE DIPSTICK: Protein, ur: NEGATIVE mg/dL

## 2022-03-11 MED ORDER — SODIUM CHLORIDE 0.9 % IV SOLN
10.0000 mg/kg | Freq: Once | INTRAVENOUS | Status: AC
  Administered 2022-03-11: 800 mg via INTRAVENOUS
  Filled 2022-03-11: qty 32

## 2022-03-11 MED ORDER — SODIUM CHLORIDE 0.9 % IV SOLN: 10.0000 mg/kg | INTRAVENOUS | Status: DC

## 2022-03-11 MED ORDER — SODIUM CHLORIDE 0.9 % IV SOLN: Freq: Once | INTRAVENOUS | Status: AC

## 2022-03-11 MED ORDER — FUROSEMIDE 20 MG PO TABS: 20.0000 mg | ORAL_TABLET | Freq: Two times a day (BID) | ORAL | 1 refills | Status: DC | PRN

## 2022-03-11 NOTE — Progress Notes (Signed)
? ?[facility]  ?[place], [place] [address] ?[phone]) [phone] ? ? ?Interval Evaluation ? ?Date of Service: 03/11/22 ?Patient Name: James Olsen ?Patient MRN: [id number] ?Patient DOB: October 08, 1946 ?Provider: [name], MD ? ?Identifying Statement:  ?KARANDEEP RESENDE is a 76 y.o. 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 ?Oncologic History: ?08/18/18: Craniotomy, resection ([facility]) ?01/24/19: Completes IMRT 55.8 Gy/30 fx to residual tumor Isidore Moos) ?06/21/22: Repeat craniotomy ([facility]) ?12/26/21: Progression of tumor, fractionated SRS Isidore Moos) ?03/11/22: Initiates avastin '10mg'$ /kg IV q3 weeks for refractory CNS inflammation  ? ?Interval History: ? [name] presents today for initial avastin infusion.  Decadron even at '4mg'$  three times per day has not improved his right sided weakness, lethargy.  Continues on Keppra '1000mg'$  twice per day, no recurrence of seizures.  He continues to "drag" the right side along; aggressive PT has continued.   Swelling in legs and shortness of breath described prior are persistent. ? ?H+P (01/20/19) Patient presented to medical attention in the winter of 2019 after noticing a "bulging" coming from the top of his head.  He also had experienced some numbness of his left leg at the time.  He obtained an MRI which demonstrated a dural based mass extending through the skull.  After a period of monitoring he underwent resection on 10/29/18 with Dr. Christella Noa.  Post-operatively he was very weak on the right side and was found to have a stroke.  After extensive rehabilitation he regained most of his right sided function.  Because of residual tumor and histology (WHO II) radiation was started on 12/19 with Dr. Isidore Moos.  Next week is his final session which he has tolerated without ill effect. ? ?Medications: ?Current Outpatient Medications on File Prior to Visit  ?Medication Sig Dispense Refill  ? baclofen (LIORESAL) 10 MG tablet  Take 1 tablet twice daily and 1 to 2 tablets at bedtime 360 tablet 2  ? bethanechol (URECHOLINE) 50 MG tablet Take 1 tablet (50 mg total) by mouth 4 (four) times daily. 120 tablet 0  ? dexamethasone (DECADRON) 2 MG tablet Take 2 tablets (4 mg total) by mouth daily. 90 tablet 2  ? levETIRAcetam (KEPPRA) 500 MG tablet Take 2 tablets (1,000 mg total) by mouth 2 (two) times daily. 60 tablet 3  ? magnesium gluconate (MAGONATE) 500 MG tablet Take 500 mg by mouth at bedtime.    ? Multiple Vitamins-Minerals (PRESERVISION AREDS 2) CAPS Take 1 capsule by mouth 2 (two) times daily.     ? silodosin (RAPAFLO) 8 MG CAPS capsule Take 8 mg by mouth daily with breakfast.    ? ?No current facility-administered medications on file prior to visit.  ? ? ?Allergies: No Known Allergies ?Past Medical History:  ?Past Medical History:  ?Diagnosis Date  ? Allergy   ? Arthritis   ? Cataract   ? GERD (gastroesophageal reflux disease)   ? Glaucoma   ? pt denies.   ? History of blood transfusion   ?  5 units after previous surgery  ? History of radiation therapy 12/09/18- 01/24/2019  ? Left sagittal brain, 11.8 Gy in 31 fractions for a total dose of 55.8 Gy  ? Hyperlipidemia   ? Macular degeneration   ? Seizures ([facility])   ? Stroke [facility])   ? after surgery in August 2019 mostly recovered still some weakness in left arm  ? Wears glasses   ? ?Past Surgical History:  ?Past Surgical History:  ?Procedure  Laterality Date  ? APPLICATION OF CRANIAL NAVIGATION N/A 06/21/2021  ? Procedure: APPLICATION OF CRANIAL NAVIGATION;  Surgeon: [name], MD;  Location: [facility];  Service: Neurosurgery;  Laterality: N/A;  ? BRAIN SURGERY    ? CHOLECYSTECTOMY  2008  ? lap choli  ? COLONOSCOPY    ? CRANIOPLASTY N/A 10/29/2018  ? Procedure: CRANIOPLASTY;  Surgeon: [name], MD;  Location: [facility];  Service: Neurosurgery;  Laterality: N/A;  CRANIOPLASTY  ? CRANIOTOMY N/A 08/18/2018  ? Procedure: CRANIOTOMY TUMOR EXCISION;  Surgeon: [name], MD;  Location: [facility];   Service: Neurosurgery;  Laterality: N/A;  CRANIOTOMY TUMOR EXCISION  ? CRANIOTOMY Bilateral 06/21/2021  ? Procedure: Bilateral Craniotomy for tumor resection;  Surgeon: [name], MD;  Location: [facility];  Service: Neurosurgery;  Laterality: Bilateral;  ? DIAGNOSTIC LAPAROSCOPY    ? EYE SURGERY    ? laser surgery to relieve pressure  ? NAILBED REPAIR Left 05/04/2014  ? Procedure: LEFT INDEX NAIL ABLATION V-Y FLAP COVERAGE;  Surgeon: [name]., MD;  Location: [facility];  Service: Orthopedics;  Laterality: Left;  index  ? PILONIDAL CYST EXCISION    ? x2  ? TONSILLECTOMY    ? VASECTOMY    ? ?Social History:  ?Social History  ? ?Socioeconomic History  ? Marital status: Married  ?  Spouse name: Not on file  ? Number of children: Not on file  ? Years of education: Not on file  ? Highest education level: Not on file  ?Occupational History  ? Not on file  ?Tobacco Use  ? Smoking status: Never  ? Smokeless tobacco: Never  ?Vaping Use  ? Vaping Use: Never used  ?Substance and Sexual Activity  ? Alcohol use: Yes  ?  Comment: occasionally  ? Drug use: No  ? Sexual activity: Not Currently  ?Other Topics Concern  ? Not on file  ?Social History Narrative  ? Not on file  ? ?Social Determinants of Health  ? ?Financial Resource Strain: Not on file  ?Food Insecurity: Not on file  ?Transportation Needs: Not on file  ?Physical Activity: Not on file  ?Stress: Not on file  ?Social Connections: Not on file  ?Intimate Partner Violence: Not on file  ? ?Family History:  ?Family History  ?Problem Relation Age of Onset  ? Diabetes Mother   ? Skin cancer Brother   ? Congenital heart disease Brother   ? ? ?Review of Systems: ?Constitutional: Denies fevers, chills or abnormal weight loss ?Eyes: Denies blurriness of vision ?Ears, nose, mouth, throat, and face: Denies mucositis or sore throat ?Respiratory: Denies cough, dyspnea or wheezes ?Cardiovascular: Denies palpitation, chest discomfort or lower extremity  swelling ?Gastrointestinal:  Denies nausea, constipation, diarrhea ?GU: Denies dysuria or incontinence ?Skin: Denies abnormal skin rashes ?Neurological: Per HPI ?Musculoskeletal: Denies joint pain, back or neck discomfort. No decrease in ROM ?Behavioral/Psych: Denies anxiety, disturbance in thought content, and mood instability ? ?Physical Exam: ?Vitals:  ? 03/11/22 1359  ?BP: 121/75  ?Pulse: 88  ?Resp: 18  ?Temp: 98.2 ?F (36.8 ?C)  ?SpO2: 97%  ? ?KPS: 60. ?General: Alert, cooperative, pleasant, in no acute distress ?Head: Craniotomy scar noted, dry and intact. ?EENT: No conjunctival injection or scleral icterus. Oral mucosa moist ?Lungs: Resp effort increased ?Cardiac: Regular rate and rhythm ?Abdomen: Soft, non-distended abdomen ?Skin: No rashes cyanosis or petechiae. ?Extremities: ++edema ? ?Neurologic Exam: ?Mental Status: Awake, alert, attentive to examiner. Oriented to self and environment. Language is fluent with intact comprehension.  ?  Cranial Nerves: Visual acuity is grossly normal. Visual fields are full. Extra-ocular movements intact. No ptosis. Face is symmetric, tongue midline. ?Motor: Tone and bulk are normal. Power is 3/5 in right arm and leg. Reflexes are symmetric, no pathologic reflexes present. Intact finger to nose bilaterally ?Sensory: Intact to light touch and temperature ?Gait: Hemiparetic, walker assisted ? ?Labs: ?I have reviewed the data as listed ?   ?Component Value Date/Time  ? NA 135 02/11/2022 1924  ? K 4.4 02/11/2022 1924  ? CL 99 02/11/2022 1924  ? CO2 26 02/11/2022 1924  ? GLUCOSE 139 (H) 02/11/2022 1924  ? BUN 26 (H) 02/11/2022 1924  ? CREATININE 0.79 02/11/2022 1924  ? CREATININE 0.84 12/03/2021 1228  ? CALCIUM 9.3 02/11/2022 1924  ? PROT 5.2 (L) 06/27/2021 0516  ? ALBUMIN 2.9 (L) 06/27/2021 0516  ? AST 21 06/27/2021 0516  ? ALT 46 (H) 06/27/2021 0516  ? ALKPHOS 55 06/27/2021 0516  ? BILITOT 0.8 06/27/2021 0516  ? GFRNONAA >60 02/11/2022 1924  ? GFRNONAA >60 12/03/2021 1228  ?  GFRAA >60 11/26/2018 1231  ? ?Lab Results  ?Component Value Date  ? WBC 8.5 02/11/2022  ? NEUTROABS 7.3 06/27/2021  ? HGB 16.1 02/11/2022  ? HCT 49.0 02/11/2022  ? MCV 90.6 02/11/2022  ? PLT 175 02/11/2022  ? ? ?Imaging: ?

## 2022-03-11 NOTE — Progress Notes (Signed)
Per Vaslow MD, pt should have Zirabev today.  ?

## 2022-03-11 NOTE — Patient Instructions (Signed)
James Olsen  Discharge Instructions: ?Thank you for choosing [facility] to provide your oncology and hematology care.  ? ?If you have a lab appointment with the [facility], please go directly to the [facility] and check in at the registration area. ?  ?Wear comfortable clothing and clothing appropriate for easy access to any Portacath or PICC line.  ? ?We strive to give you quality time with your provider. You may need to reschedule your appointment if you arrive late (15 or more minutes).  Arriving late affects you and other patients whose appointments are after yours.  Also, if you miss three or more appointments without notifying the office, you may be dismissed from the clinic at the provider?s discretion.    ?  ?For prescription refill requests, have your pharmacy contact our office and allow 72 hours for refills to be completed.   ? ?Today you received the following chemotherapy and/or immunotherapy agents: Bevacizumab.     ?  ?To help prevent nausea and vomiting after your treatment, we encourage you to take your nausea medication as directed. ? ?BELOW ARE SYMPTOMS THAT SHOULD BE REPORTED IMMEDIATELY: ?*FEVER GREATER THAN 100.4 F (38 ?C) OR HIGHER ?*CHILLS OR SWEATING ?*NAUSEA AND VOMITING THAT IS NOT CONTROLLED WITH YOUR NAUSEA MEDICATION ?*UNUSUAL SHORTNESS OF BREATH ?*UNUSUAL BRUISING OR BLEEDING ?*URINARY PROBLEMS (pain or burning when urinating, or frequent urination) ?*BOWEL PROBLEMS (unusual diarrhea, constipation, pain near the anus) ?TENDERNESS IN MOUTH AND THROAT WITH OR WITHOUT PRESENCE OF ULCERS (sore throat, sores in mouth, or a toothache) ?UNUSUAL RASH, SWELLING OR PAIN  ?UNUSUAL VAGINAL DISCHARGE OR ITCHING  ? ?Items with * indicate a potential emergency and should be followed up as soon as possible or go to the Emergency Department if any problems should occur. ? ?Please show the CHEMOTHERAPY ALERT CARD or IMMUNOTHERAPY ALERT CARD at check-in  to the Emergency Department and triage nurse. ? ?Should you have questions after your visit or need to cancel or reschedule your appointment, please contact [facility]  Dept: [phone]  and follow the prompts.  Office hours are 8:00 a.m. to 4:30 p.m. Monday - Friday. Please note that voicemails left after 4:00 p.m. may not be returned until the following business day.  We are closed weekends and major holidays. You have access to a nurse at all times for urgent questions. Please call the main number to the clinic Dept: [phone] and follow the prompts. ? ? ?For any non-urgent questions, you may also contact your provider using MyChart. We now offer e-Visits for anyone 8 and older to request care online for non-urgent symptoms. For details visit [email or web address]. ?  ?Also download the MyChart app! Go to the app store, search "MyChart", open the app, select [facility], and log in with your MyChart username and password. ? ?Due to Covid, a mask is required upon entering the hospital/clinic. If you do not have a mask, one will be given to you upon arrival. For doctor visits, patients may have 1 support person aged 43 or older with them. For treatment visits, patients cannot have anyone with them due to current Covid guidelines and our immunocompromised population.  ? ?Bevacizumab injection ?What is this medication? ?BEVACIZUMAB (be va SIZ yoo mab) is a monoclonal antibody. It is used to treat many types of cancer. ?This medicine may be used for other purposes; ask your health care provider or pharmacist if you have questions. ?COMMON BRAND NAME(S): Alymsys,  Avastin, MVASI, Zirabev ?What should I tell my care team before I take this medication? ?They need to know if you have any of these conditions: ?diabetes ?heart disease ?high blood pressure ?history of coughing up blood ?prior anthracycline chemotherapy (e.g., doxorubicin, daunorubicin, epirubicin) ?recent or ongoing  radiation therapy ?recent or planning to have surgery ?stroke ?an unusual or allergic reaction to bevacizumab, hamster proteins, mouse proteins, other medicines, foods, dyes, or preservatives ?pregnant or trying to get pregnant ?breast-feeding ?How should I use this medication? ?This medicine is for infusion into a vein. It is given by a health care professional in a hospital or clinic setting. ?Talk to your pediatrician regarding the use of this medicine in children. Special care may be needed. ?Overdosage: If you think you have taken too much of this medicine contact a poison control center or emergency room at once. ?NOTE: This medicine is only for you. Do not share this medicine with others. ?What if I miss a dose? ?It is important not to miss your dose. Call your doctor or health care professional if you are unable to keep an appointment. ?What may interact with this medication? ?Interactions are not expected. ?This list may not describe all possible interactions. Give your health care provider a list of all the medicines, herbs, non-prescription drugs, or dietary supplements you use. Also tell them if you smoke, drink alcohol, or use illegal drugs. Some items may interact with your medicine. ?What should I watch for while using this medication? ?Your condition will be monitored carefully while you are receiving this medicine. You will need important blood work and urine testing done while you are taking this medicine. ?This medicine may increase your risk to bruise or bleed. Call your doctor or health care professional if you notice any unusual bleeding. ?Before having surgery, talk to your health care provider to make sure it is ok. This drug can increase the risk of poor healing of your surgical site or wound. You will need to stop this drug for 28 days before surgery. After surgery, wait at least 28 days before restarting this drug. Make sure the surgical site or wound is healed enough before restarting  this drug. Talk to your health care provider if questions. ?Do not become pregnant while taking this medicine or for 6 months after stopping it. Women should inform their doctor if they wish to become pregnant or think they might be pregnant. There is a potential for serious side effects to an unborn child. Talk to your health care professional or pharmacist for more information. Do not breast-feed an infant while taking this medicine and for 6 months after the last dose. ?This medicine has caused ovarian failure in some women. This medicine may interfere with the ability to have a child. You should talk to your doctor or health care professional if you are concerned about your fertility. ?What side effects may I notice from receiving this medication? ?Side effects that you should report to your doctor or health care professional as soon as possible: ?allergic reactions like skin rash, itching or hives, swelling of the face, lips, or tongue ?chest pain or chest tightness ?chills ?coughing up blood ?high fever ?seizures ?severe constipation ?signs and symptoms of bleeding such as bloody or black, tarry stools; red or dark-brown urine; spitting up blood or brown material that looks like coffee grounds; red spots on the skin; unusual bruising or bleeding from the eye, gums, or nose ?signs and symptoms of a blood clot such as breathing  problems; chest pain; severe, sudden headache; pain, swelling, warmth in the leg ?signs and symptoms of a stroke like changes in vision; confusion; trouble speaking or understanding; severe headaches; sudden numbness or weakness of the face, arm or leg; trouble walking; dizziness; loss of balance or coordination ?stomach pain ?sweating ?swelling of legs or ankles ?vomiting ?weight gain ?Side effects that usually do not require medical attention (report to your doctor or health care professional if they continue or are bothersome): ?back pain ?changes in taste ?decreased appetite ?dry  skin ?nausea ?tiredness ?This list may not describe all possible side effects. Call your doctor for medical advice about side effects. You may report side effects to FDA at 1-800-FDA-1088. ?Where should I keep

## 2022-03-13 ENCOUNTER — Telehealth: Payer: Self-pay | Admitting: *Deleted

## 2022-03-13 ENCOUNTER — Ambulatory Visit: Payer: Self-pay | Admitting: Physical Therapy

## 2022-03-13 NOTE — Telephone Encounter (Signed)
Canceled appointment on 03/27/2022 since patient was seen sooner and his visits have been updated.  Left message at patients home to advise. ?

## 2022-03-14 ENCOUNTER — Ambulatory Visit: Payer: Medicare Other | Admitting: Physical Therapy

## 2022-03-17 ENCOUNTER — Encounter (HOSPITAL_BASED_OUTPATIENT_CLINIC_OR_DEPARTMENT_OTHER): Payer: Self-pay | Admitting: Emergency Medicine

## 2022-03-17 ENCOUNTER — Emergency Department (HOSPITAL_BASED_OUTPATIENT_CLINIC_OR_DEPARTMENT_OTHER): Payer: Medicare Other

## 2022-03-17 ENCOUNTER — Emergency Department (HOSPITAL_BASED_OUTPATIENT_CLINIC_OR_DEPARTMENT_OTHER): Payer: Medicare Other | Admitting: Radiology

## 2022-03-17 ENCOUNTER — Other Ambulatory Visit: Payer: Self-pay

## 2022-03-17 ENCOUNTER — Telehealth: Payer: Self-pay | Admitting: *Deleted

## 2022-03-17 ENCOUNTER — Emergency Department (HOSPITAL_BASED_OUTPATIENT_CLINIC_OR_DEPARTMENT_OTHER)
Admission: EM | Admit: 2022-03-17 | Discharge: 2022-03-17 | Disposition: A | Discharge status: Home / Self Care | Payer: Medicare Other | Attending: Emergency Medicine | Admitting: Emergency Medicine

## 2022-03-17 DIAGNOSIS — W19XXXA Unspecified fall, initial encounter: Secondary | ICD-10-CM | POA: Diagnosis not present

## 2022-03-17 DIAGNOSIS — R531 Weakness: Secondary | ICD-10-CM | POA: Diagnosis not present

## 2022-03-17 DIAGNOSIS — R0602 Shortness of breath: Secondary | ICD-10-CM | POA: Diagnosis not present

## 2022-03-17 DIAGNOSIS — S300XXA Contusion of lower back and pelvis, initial encounter: Secondary | ICD-10-CM | POA: Insufficient documentation

## 2022-03-17 DIAGNOSIS — R600 Localized edema: Secondary | ICD-10-CM | POA: Diagnosis not present

## 2022-03-17 DIAGNOSIS — S3992XA Unspecified injury of lower back, initial encounter: Secondary | ICD-10-CM | POA: Diagnosis present

## 2022-03-17 LAB — CBC
HCT: 48.2 % (ref 39.0–52.0)
Hemoglobin: 16.2 g/dL (ref 13.0–17.0)
MCH: 30.3 pg (ref 26.0–34.0)
MCHC: 33.6 g/dL (ref 30.0–36.0)
MCV: 90.1 fL (ref 80.0–100.0)
Platelets: 139 10*3/uL — ABNORMAL LOW (ref 150–400)
RBC: 5.35 MIL/uL (ref 4.22–5.81)
RDW: 15.3 % (ref 11.5–15.5)
WBC: 12.5 10*3/uL — ABNORMAL HIGH (ref 4.0–10.5)
nRBC: 0 % (ref 0.0–0.2)

## 2022-03-17 LAB — TROPONIN I (HIGH SENSITIVITY): Troponin I (High Sensitivity): 12 ng/L (ref <18)

## 2022-03-17 LAB — BASIC METABOLIC PANEL
Anion gap: 12 (ref 5–15)
BUN: 26 mg/dL — ABNORMAL HIGH (ref 8–23)
CO2: 28 mmol/L (ref 22–32)
Calcium: 8.9 mg/dL (ref 8.9–10.3)
Chloride: 97 mmol/L — ABNORMAL LOW (ref 98–111)
Creatinine, Ser: 0.87 mg/dL (ref 0.61–1.24)
GFR, Estimated: >60 mL/min (ref >60)
Glucose, Bld: 159 mg/dL — ABNORMAL HIGH (ref 70–99)
Potassium: 4.2 mmol/L (ref 3.5–5.1)
Sodium: 137 mmol/L (ref 135–145)

## 2022-03-17 MED ORDER — ACETAMINOPHEN 500 MG PO TABS
1000.0000 mg | ORAL_TABLET | Freq: Once | ORAL | Status: AC
  Administered 2022-03-17: 1000 mg via ORAL
  Filled 2022-03-17: qty 2

## 2022-03-17 NOTE — ED Triage Notes (Signed)
Patient is coming from home BIB [facility]:  ?Patient reports to the ER for frequent falls. Patient reports he had a fall this morning. 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 Patient reports increase in falls since the treatments started ?

## 2022-03-17 NOTE — ED Notes (Signed)
Patient transported to Radiology 

## 2022-03-17 NOTE — ED Notes (Signed)
XRAY at the Bedside. ?

## 2022-03-17 NOTE — Discharge Instructions (Signed)
It does not appear that there was a acute break today but there is some arthritis and issues with your back.  Even if there is a small break it just heals on its own.  You can take extra strength Tylenol every 6 hours as needed for discomfort and the pain will get worse over the next few days.  You can use your rollator however if you are feeling too weak you can use the wheelchair so that you avoid further falls.  It would be a good idea to keep in close contact with your doctor so they know how you are doing.  Make sure you are staying hydrated and eating well.  If you start getting swelling in your legs again call your doctor.  Make sure you are keeping your legs moisturized.  Home health consult was placed so someone can come evaluate you and see if you would benefit from physical therapy and further help at home. ?

## 2022-03-17 NOTE — ED Provider Notes (Signed)
?[facility] ?Provider Note ? ? ?CSN: [id number] ?Arrival date & time: 03/17/22  1054 ? ?  ? ?History ? ?Chief Complaint  ?Patient presents with  ? Fall  ? Shortness of Breath  ? ? ?James Olsen is a 76 y.o. male. ? 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  He reports that he was up trying to walk when his legs just gave out on him.  He sat down hard on the tile floor and had significant pain in his lower back.  He denies hitting his head or loss of consciousness.  He does not think he injured his legs in the fall and denies any chest pain, abdominal pain or upper extremity pain.  Patient reports that he has decreased his Decadron to 2 mg daily and has been following with Dr. Mickeal Skinner.  Over the last week they have noticed he has been more short of breath with swelling in his lower extremities and he seems to get out of breath with just small activities.  He was started on Lasix but family does not feel that his symptoms have improved.  Currently he denies feeling short of breath however he was tachypneic and placed on oxygen by staff for comfort. ? ?The history is provided by the patient, medical records and a relative.  ?Fall ?Associated symptoms include shortness of breath.  ?Shortness of Breath ? ?  ? ?Home Medications ?Prior to Admission medications   ?Medication Sig Start Date End Date Taking? Authorizing Provider  ?baclofen (LIORESAL) 10 MG tablet Take 1 tablet twice daily and 1 to 2 tablets at bedtime 12/11/21   Meredith Staggers, MD  ?bethanechol (URECHOLINE) 50 MG tablet Take 1 tablet (50 mg total) by mouth 4 (four) times daily. 07/17/21   Love, Ivan Anchors, PA-C  ?dexamethasone (DECADRON) 2 MG tablet Take 2 tablets (4 mg total) by mouth daily. 02/20/22   [name], MD  ?furosemide (LASIX) 20 MG tablet Take 1 tablet (20 mg total) by mouth 2 (two) times daily as needed for fluid or edema. 03/11/22   [name], MD  ?levETIRAcetam (KEPPRA) 500 MG tablet Take 2 tablets (1,000 mg total) by mouth 2 (two) times daily. 02/11/22   Truddie Hidden, MD  ?magnesium gluconate (MAGONATE) 500 MG tablet Take 500 mg by mouth at bedtime.    [provider]  ?Multiple Vitamins-Minerals (PRESERVISION AREDS 2) CAPS Take 1 capsule by mouth 2 (two) times daily.     [provider]  ?silodosin (RAPAFLO) 8 MG CAPS capsule Take 8 mg by mouth daily with breakfast.    [provider]  ?   ? ?Allergies    ?Patient has no known allergies.   ? ?Review of Systems   ?Review of Systems  ?Respiratory:  Positive for shortness of breath.   ? ?Physical Exam ?Updated Vital Signs ?BP 103/75   Pulse 86   Temp 98.2 ?F (36.8 ?C) (Oral)   Resp 15   Ht '5\' 9"'$  (1.753 m)   Wt 83.2 kg   SpO2 95%   BMI 27.09 kg/m?  ?Physical Exam ?Vitals and nursing note reviewed.  ?Constitutional:   ?   General: He is not in acute distress. ?   Appearance: He is well-developed.  ?HENT:  ?   Head: Normocephalic  and atraumatic.  ?   Comments: Surgical scars noted to the top of the head with multiple small masses ?   Mouth/Throat:  ?   Mouth: Mucous membranes are moist.  ?Eyes:  ?   Conjunctiva/sclera: Conjunctivae normal.  ?   Pupils: Pupils are equal, round, and reactive to light.  ?Cardiovascular:  ?   Rate and Rhythm: Normal rate and regular rhythm.  ?   Pulses: Normal pulses.  ?   Heart sounds: No murmur heard. ?Pulmonary:  ?   Effort: Pulmonary effort is normal. No respiratory distress.  ?   Breath sounds: Normal breath sounds. No wheezing or rales.  ?   Comments: Decreased breath sounds in bilateral lower lobes but patient is lying flat ?Abdominal:  ?   General: There is no distension.  ?   Palpations: Abdomen is soft.  ?   Tenderness: There is no abdominal tenderness. There is no  guarding or rebound.  ?Musculoskeletal:     ?   General: Normal range of motion.  ?   Cervical back: Normal range of motion and neck supple. No tenderness.  ?   Lumbar back: Tenderness and bony tenderness present.  ?     Back: ? ?   Right lower leg: Edema present.  ?   Left lower leg: Edema present.  ?   Comments: Trace edema at the ankles bilaterally  ?Skin: ?   General: Skin is warm and dry.  ?   Findings: No erythema or rash.  ?Neurological:  ?   Mental Status: He is alert and oriented to person, place, and time.  ?   Motor: Weakness present.  ?   Comments: 4-5 strength noted to the right lower extremity.  5/5 strength of left lower extremity.  Speech is fluent without evidence of aphasia.  He does have to pause at times and take time to gather his thoughts.  ?Psychiatric:     ?   Behavior: Behavior normal.  ? ? ?ED Results / Procedures / Treatments   ?Labs ?(all labs ordered are listed, but only abnormal results are displayed) ?Labs Reviewed  ?BASIC METABOLIC PANEL - Abnormal; Notable for the following components:  ?    Result Value  ? Chloride 97 (*)   ? Glucose, Bld 159 (*)   ? BUN 26 (*)   ? All other components within normal limits  ?CBC - Abnormal; Notable for the following components:  ? WBC 12.5 (*)   ? Platelets 139 (*)   ? All other components within normal limits  ?TROPONIN I (HIGH SENSITIVITY)  ? ? ?EKG ?None ? ?Radiology ?CT Head Wo Contrast ? ?Result Date: 03/17/2022 ?CLINICAL DATA:  Poly trauma, blunt. Additional history provided: Fall EXAM: CT HEAD WITHOUT CONTRAST TECHNIQUE: Contiguous axial images were obtained from the base of the skull through the vertex without intravenous contrast. RADIATION DOSE REDUCTION: This exam was performed according to the departmental dose-optimization program which includes automated exposure control, adjustment of the mA and/or kV according to patient size and/or use of iterative reconstruction technique. COMPARISON:  Brain MRI 02/26/2022.  Head CT 02/11/2022.  FINDINGS: Brain: Mild generalized parenchymal atrophy. Redemonstrated sequela of prior bilateral frontoparietal craniotomy and mass resection. As before, there is associated encephalomalacia/gliosis, and possible edema, within the left frontoparietal and right frontal lobes. A small extra-axial fluid collection deep to the cranioplasty is also unchanged. Suspected residual/recurrent extra-axial intracranial, and extracranial, meningioma along the posterior aspect of the cranioplasty is grossly unchanged as compared to  the prior MRI 02/26/2022 (for instance as seen on series 4, image 28) (series 5, image 33). Please refer to the recent prior brain MRI of 02/26/2022 for description of a separate nodular focus of enhancement along the high right frontal convexity. Unchanged mild nonspecific dural thickening along the mid falx (for instance as seen on series 2, image 23) (series 4, image 31). A 1.7 cm meningioma more laterally along the left frontal lobe also appears unchanged from the recent prior MRI (series 2, images 18 and 19). As before, there is local mass effect upon the underlying left frontal lobe without appreciable underlying parenchymal edema. Moderate patchy and ill-defined hypoattenuation elsewhere within the cerebral white matter, nonspecific but compatible with chronic small vessel ischemic disease. There is no acute intracranial hemorrhage. No acute demarcated cortical infarct. No midline shift. Vascular: No hyperdense vessel.  Atherosclerotic calcifications. Skull: Bilateral frontoparietal cranioplasty. No acute calvarial fracture. Sinuses/Orbits: Visualized orbits show no acute finding. Trace mucosal thickening within the bilateral ethmoid sinuses. IMPRESSION: 1. No acute posttraumatic intracranial findings. 2. Redemonstrated sequela of prior high frontoparietal craniotomy and meningioma resection. As before, there is associated encephalomalacia/gliosis, and possible edema, within the underlying left  frontoparietal and right frontal lobes. Suspected residual/recurrent extra-axial intracranial, and extracranial, meningioma along the posterior aspect of the cranioplasty is grossly unchanged from the recent prior MRI 0

## 2022-03-17 NOTE — Telephone Encounter (Signed)
Patient called to let us know that he has fallen and has called EMS to take him to the hospital.  He left a vm therefore no further details about fall (with/without injury is unknown).   ? ?He states that he has become very weak since his Avastin treatment.  He reports being completely unable to get out of his chair when needed which has resulted in states of unintentional incontinence.   ? ?Routing to provider. ?

## 2022-03-17 NOTE — ED Notes (Signed)
Dr. Maryan Rued in room w/pt now. ?

## 2022-03-17 NOTE — ED Triage Notes (Signed)
Pt reports fall this morning - "feels like my legs went out from under me.", pt landed on floor and EMS was called to help left him off the floor.  Pt reports lower back pain. Pt being treated for meningioma and has some right side deficits from that. Per family in room, pt seems "SOB and weak" more than his norm. Pt recently put on lasix for excess fluid on his lungs.  No focal deficits noted outside of pt's described norm in triage. ?

## 2022-03-18 ENCOUNTER — Ambulatory Visit: Payer: Medicare Other | Admitting: Rehabilitation

## 2022-03-20 ENCOUNTER — Ambulatory Visit: Payer: Self-pay | Admitting: Physical Therapy

## 2022-03-21 ENCOUNTER — Telehealth: Payer: Self-pay | Admitting: Physician Assistant

## 2022-03-21 ENCOUNTER — Other Ambulatory Visit: Payer: Medicare Other

## 2022-03-21 NOTE — Telephone Encounter (Signed)
Lvm to let him know we can't the referral for home health until pt is seen 03/27/22 for face to face ?

## 2022-03-21 NOTE — Telephone Encounter (Signed)
Patient wife called along with patient- Stated she would like to know if home health orders have been started. It is her first time calling our office for such information - She is aware that [facility] added orders but they have not received any information about it. A hosp fu as been set as per DC orders or after summary visit for 4/6 at 1 pm-    ?

## 2022-03-23 ENCOUNTER — Encounter: Payer: Self-pay | Admitting: Internal Medicine

## 2022-03-24 ENCOUNTER — Telehealth (INDEPENDENT_AMBULATORY_CARE_PROVIDER_SITE_OTHER): Payer: Medicare Other | Admitting: Physician Assistant

## 2022-03-24 DIAGNOSIS — W19XXXD Unspecified fall, subsequent encounter: Secondary | ICD-10-CM | POA: Diagnosis not present

## 2022-03-24 DIAGNOSIS — R531 Weakness: Secondary | ICD-10-CM | POA: Diagnosis not present

## 2022-03-24 DIAGNOSIS — S300XXD Contusion of lower back and pelvis, subsequent encounter: Secondary | ICD-10-CM | POA: Diagnosis not present

## 2022-03-24 DIAGNOSIS — D329 Benign neoplasm of meninges, unspecified: Secondary | ICD-10-CM

## 2022-03-24 NOTE — Progress Notes (Signed)
? ?  Virtual Visit via Video Note ? ?I connected with  [name]  on 03/24/22 at 11:30 AM EDT by a video enabled telemedicine application and verified that I am speaking with the correct person using two identifiers. ? ?Location: ?Patient: home ?Provider: Therapist, music at [employer] ?Persons present: Patient, his wife, and myself ?  ?I discussed the limitations of evaluation and management by telemedicine and the availability of in person appointments. The patient expressed understanding and agreed to proceed.  ? ?History of Present Illness: ? ?One week out from fall (ED visit on 03/17/22). ?Taking ES Tylenol every 6 hours. Otherwise feels a knot in the middle of his back.  ?Woke up with least amount of pain this morning he has had in awhile.  ? ?Wheelchair with gel cushion has been helping tremendously. Also has a rollator and this has been helping him. Moderate difficulty with transfers, improving per wife. ? ?No strength to get out of the house. 2 steps with railings, 8 steps to the car, even hard to get out with that.  ? ?Needing help with hygiene. Speech, OT, PT all requested.  ? ?Wife recovering from back surgery. Will need help with transportation. ? ?  ?Observations/Objective: ? ? ?Gen: Awake, alert, no acute distress ?Resp: Breathing is even and non-labored ?Psych: calm/pleasant demeanor ?Neuro: Alert and Oriented x 3, + facial symmetry, speech is clear. ? ? ?Assessment and Plan: ? ?76 year old male with history of meningioma currently undergoing treatment.  Recent fall secondary to weakness that resulted in emergency department visit on 03/17/2022.  He was diagnosed with lumbar contusion as it did not appear that he had an acute fracture of his spine.  However he continues to have a lot of weakness.  He is going to follow-up with Dr. Mickeal Skinner who is treating his meningiomas.  I will place orders for speech OT and PT as he needs help with this at the house.  Wife agrees to contact  [place] transportation services to arrange for help next week and I provided number for them.  They know to call back if any questions or concerns. ? ?Follow Up Instructions: ? ?  ?I discussed the assessment and treatment plan with the patient. The patient was provided an opportunity to ask questions and all were answered. The patient agreed with the plan and demonstrated an understanding of the instructions. ?  ?The patient was advised to call back or seek an in-person evaluation if the symptoms worsen or if the condition fails to improve as anticipated. ? ? ?Vivan Agostino M Casilda Pickerill, PA-C ? ?

## 2022-03-25 ENCOUNTER — Ambulatory Visit: Payer: Self-pay | Admitting: Physical Therapy

## 2022-03-25 ENCOUNTER — Inpatient Hospital Stay: Payer: Medicare Other | Attending: Internal Medicine | Admitting: Internal Medicine

## 2022-03-25 ENCOUNTER — Telehealth: Payer: Self-pay | Admitting: Internal Medicine

## 2022-03-25 DIAGNOSIS — I6789 Other cerebrovascular disease: Secondary | ICD-10-CM

## 2022-03-25 DIAGNOSIS — R569 Unspecified convulsions: Secondary | ICD-10-CM | POA: Diagnosis not present

## 2022-03-25 DIAGNOSIS — Y842 Radiological procedure and radiotherapy as the cause of abnormal reaction of the patient, or of later complication, without mention of misadventure at the time of the procedure: Secondary | ICD-10-CM | POA: Diagnosis not present

## 2022-03-25 DIAGNOSIS — D420 Neoplasm of uncertain behavior of cerebral meninges: Secondary | ICD-10-CM

## 2022-03-25 DIAGNOSIS — C713 Malignant neoplasm of parietal lobe: Secondary | ICD-10-CM | POA: Insufficient documentation

## 2022-03-25 DIAGNOSIS — Z5112 Encounter for antineoplastic immunotherapy: Secondary | ICD-10-CM | POA: Insufficient documentation

## 2022-03-25 NOTE — Progress Notes (Signed)
I connected with James Olsen on 03/25/22 at 11:30 AM EDT by telephone visit and verified that I am speaking with the correct person using two identifiers.  ?I discussed the limitations, risks, security and privacy concerns of performing an evaluation and management service by telemedicine and the availability of in-person appointments. I also discussed with the patient that there may be a patient responsible charge related to this service. The patient expressed understanding and agreed to proceed.  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 ?Focal seizures ([place]) ? ?Radiation therapy induced brain necrosis ? ?History of Present Ilness: James Olsen describes fever, chills last night.  He did wake up and have difficulty speaking, per his wife, although that resolved by morning.  No respiratory or urinary symptoms.  He seems to be feeling better this morning, afebrile.  Swelling has improved in his legs with the lasix.  Yesterday dropped down to '1mg'$  decadron.   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 ?Focal seizures ([place]) ? ?Radiation therapy induced brain necrosis ? ?James Olsen presents with possible breakthrough seizure, likely secondary to low grade fever.  Rapid taper of decadron is likely also contributing to symptom burden. ? ?We recommended he resume dosing '4mg'$  daily decadron for additional adrenal-axis support. ? ?He will continue to rest and hydrate during his illness until recovered. ?  ?Follow Up Instructions: We will still plan to see him for avastin next week ? ?I discussed the assessment and treatment plan with the patient.  The patient was provided an opportunity to ask questions and all were answered.  The patient agreed with the plan  and demonstrated understanding of the instructions.   ? ?The patient was advised to call back or seek an in-person evaluation if the symptoms worsen or if the condition fails to improve as anticipated.  I provided 5-10 minutes of non-face-to-face time during this enocunter. ? ?[name], MD ? ? ?I provided 22 minutes of non face-to-face telephone visit time during this encounter, and > 50% was spent counseling as documented under my assessment & plan.  ? ?

## 2022-03-25 NOTE — Telephone Encounter (Signed)
.  Called patient to schedule appointment per 4/3 inbasket, patient wife is aware of date and time.   ?

## 2022-03-27 ENCOUNTER — Inpatient Hospital Stay: Payer: Medicare Other | Admitting: Physician Assistant

## 2022-03-27 ENCOUNTER — Ambulatory Visit: Payer: Self-pay | Admitting: Physical Therapy

## 2022-03-27 ENCOUNTER — Inpatient Hospital Stay: Payer: Medicare Other | Admitting: Internal Medicine

## 2022-03-28 ENCOUNTER — Telehealth: Payer: Self-pay | Admitting: *Deleted

## 2022-03-28 NOTE — Telephone Encounter (Signed)
James Olsen called several times over course of day to update on James Olsen:  ? ?He was very tired last night and movement was difficult, especially transferring for wheelchair to rollator for PM hygiene care. ? ?She states she initially was concerned that he was difficult to arouse early in AM. She stated she realized from checking his fitbit that when she tried to wake him the first time, he had only been asleep a few hours, so she let him sleep.  ? ?Per her updates, he slept most of day.  By 3:30 she stated she was able to wake him and give some meds and encourage him to drink fluids so she was encouraged.  ?James Olsen stated the change in dexamethasone dose has helped his energy, but he is still weak/fatigued. ? ?She provided information that [facility] will be evaluating James Olsen for PT, OT,and Speech Therapy and services should begin soon after evaluation on Sunday.  ? ?James Olsen asked for assistance from [facility] staff arranging transportation to [place] on Tuesday for his appts as she is unsure what to do. She states he will be using a wheelchair and needs assistance exiting the house. She called one service and was informed that the transportation service cannot come in home due to liability. James Olsen states she may call EMS or the fire department to see if they can bring him by ambulance. ? ?Message sent to SW and to [place] transportation coordinator stating patient need and requesting any ideas to assist. ? ?Message/information forwarded to Dr. Viann Shove RN as Juluis Rainier ? ?

## 2022-03-29 ENCOUNTER — Encounter: Payer: Self-pay | Admitting: Internal Medicine

## 2022-03-30 ENCOUNTER — Encounter: Payer: Self-pay | Admitting: Internal Medicine

## 2022-03-30 ENCOUNTER — Encounter: Payer: Self-pay | Admitting: Physician Assistant

## 2022-03-31 ENCOUNTER — Other Ambulatory Visit: Payer: Self-pay | Admitting: Internal Medicine

## 2022-03-31 ENCOUNTER — Encounter: Payer: Self-pay | Admitting: Internal Medicine

## 2022-03-31 ENCOUNTER — Other Ambulatory Visit: Payer: Self-pay | Admitting: *Deleted

## 2022-03-31 DIAGNOSIS — R5383 Other fatigue: Secondary | ICD-10-CM

## 2022-03-31 DIAGNOSIS — D420 Neoplasm of uncertain behavior of cerebral meninges: Secondary | ICD-10-CM

## 2022-03-31 DIAGNOSIS — M6281 Muscle weakness (generalized): Secondary | ICD-10-CM

## 2022-03-31 DIAGNOSIS — E559 Vitamin D deficiency, unspecified: Secondary | ICD-10-CM

## 2022-03-31 DIAGNOSIS — Z7952 Long term (current) use of systemic steroids: Secondary | ICD-10-CM

## 2022-03-31 DIAGNOSIS — G3184 Mild cognitive impairment, so stated: Secondary | ICD-10-CM

## 2022-04-01 ENCOUNTER — Other Ambulatory Visit: Payer: Self-pay

## 2022-04-01 ENCOUNTER — Inpatient Hospital Stay: Payer: Medicare Other

## 2022-04-01 ENCOUNTER — Observation Stay (HOSPITAL_COMMUNITY)
Admission: EM | Admit: 2022-04-01 | Discharge: 2022-04-02 | Disposition: A | Discharge status: Home / Self Care | Payer: Medicare Other | Attending: Internal Medicine | Admitting: Internal Medicine

## 2022-04-01 ENCOUNTER — Encounter (HOSPITAL_COMMUNITY): Payer: Self-pay | Admitting: Emergency Medicine

## 2022-04-01 ENCOUNTER — Emergency Department (HOSPITAL_COMMUNITY): Payer: Medicare Other

## 2022-04-01 ENCOUNTER — Inpatient Hospital Stay (HOSPITAL_BASED_OUTPATIENT_CLINIC_OR_DEPARTMENT_OTHER): Payer: Medicare Other | Admitting: Internal Medicine

## 2022-04-01 VITALS — BP 101/78 | HR 87 | Temp 97.5°F | Resp 16

## 2022-04-01 DIAGNOSIS — Z6826 Body mass index (BMI) 26.0-26.9, adult: Secondary | ICD-10-CM | POA: Diagnosis not present

## 2022-04-01 DIAGNOSIS — Z20822 Contact with and (suspected) exposure to covid-19: Secondary | ICD-10-CM | POA: Insufficient documentation

## 2022-04-01 DIAGNOSIS — D420 Neoplasm of uncertain behavior of cerebral meninges: Secondary | ICD-10-CM | POA: Diagnosis not present

## 2022-04-01 DIAGNOSIS — R0602 Shortness of breath: Secondary | ICD-10-CM | POA: Diagnosis present

## 2022-04-01 DIAGNOSIS — I6789 Other cerebrovascular disease: Secondary | ICD-10-CM

## 2022-04-01 DIAGNOSIS — M79604 Pain in right leg: Secondary | ICD-10-CM | POA: Insufficient documentation

## 2022-04-01 DIAGNOSIS — D329 Benign neoplasm of meninges, unspecified: Secondary | ICD-10-CM | POA: Insufficient documentation

## 2022-04-01 DIAGNOSIS — J811 Chronic pulmonary edema: Secondary | ICD-10-CM | POA: Insufficient documentation

## 2022-04-01 DIAGNOSIS — R9082 White matter disease, unspecified: Secondary | ICD-10-CM | POA: Insufficient documentation

## 2022-04-01 DIAGNOSIS — Z9181 History of falling: Secondary | ICD-10-CM | POA: Insufficient documentation

## 2022-04-01 DIAGNOSIS — G9389 Other specified disorders of brain: Secondary | ICD-10-CM | POA: Insufficient documentation

## 2022-04-01 DIAGNOSIS — I700 Atherosclerosis of aorta: Secondary | ICD-10-CM | POA: Diagnosis not present

## 2022-04-01 DIAGNOSIS — Z9889 Other specified postprocedural states: Secondary | ICD-10-CM | POA: Diagnosis not present

## 2022-04-01 DIAGNOSIS — Y842 Radiological procedure and radiotherapy as the cause of abnormal reaction of the patient, or of later complication, without mention of misadventure at the time of the procedure: Secondary | ICD-10-CM | POA: Diagnosis not present

## 2022-04-01 DIAGNOSIS — R4182 Altered mental status, unspecified: Secondary | ICD-10-CM | POA: Insufficient documentation

## 2022-04-01 DIAGNOSIS — Z86011 Personal history of benign neoplasm of the brain: Secondary | ICD-10-CM | POA: Insufficient documentation

## 2022-04-01 DIAGNOSIS — R5383 Other fatigue: Secondary | ICD-10-CM

## 2022-04-01 DIAGNOSIS — R531 Weakness: Secondary | ICD-10-CM | POA: Diagnosis not present

## 2022-04-01 DIAGNOSIS — G3189 Other specified degenerative diseases of nervous system: Secondary | ICD-10-CM | POA: Insufficient documentation

## 2022-04-01 DIAGNOSIS — M6281 Muscle weakness (generalized): Secondary | ICD-10-CM

## 2022-04-01 DIAGNOSIS — Z7952 Long term (current) use of systemic steroids: Secondary | ICD-10-CM | POA: Diagnosis not present

## 2022-04-01 DIAGNOSIS — Z923 Personal history of irradiation: Secondary | ICD-10-CM | POA: Insufficient documentation

## 2022-04-01 DIAGNOSIS — E872 Acidosis, unspecified: Secondary | ICD-10-CM | POA: Diagnosis not present

## 2022-04-01 DIAGNOSIS — Z993 Dependence on wheelchair: Secondary | ICD-10-CM | POA: Insufficient documentation

## 2022-04-01 DIAGNOSIS — R509 Fever, unspecified: Secondary | ICD-10-CM | POA: Diagnosis not present

## 2022-04-01 DIAGNOSIS — K219 Gastro-esophageal reflux disease without esophagitis: Secondary | ICD-10-CM | POA: Diagnosis present

## 2022-04-01 DIAGNOSIS — R918 Other nonspecific abnormal finding of lung field: Secondary | ICD-10-CM | POA: Diagnosis not present

## 2022-04-01 DIAGNOSIS — R569 Unspecified convulsions: Secondary | ICD-10-CM

## 2022-04-01 DIAGNOSIS — H409 Unspecified glaucoma: Secondary | ICD-10-CM | POA: Insufficient documentation

## 2022-04-01 DIAGNOSIS — M1990 Unspecified osteoarthritis, unspecified site: Secondary | ICD-10-CM | POA: Insufficient documentation

## 2022-04-01 DIAGNOSIS — I6782 Cerebral ischemia: Secondary | ICD-10-CM | POA: Insufficient documentation

## 2022-04-01 DIAGNOSIS — R2681 Unsteadiness on feet: Secondary | ICD-10-CM | POA: Insufficient documentation

## 2022-04-01 DIAGNOSIS — Z86018 Personal history of other benign neoplasm: Secondary | ICD-10-CM

## 2022-04-01 DIAGNOSIS — J22 Unspecified acute lower respiratory infection: Secondary | ICD-10-CM | POA: Insufficient documentation

## 2022-04-01 DIAGNOSIS — E785 Hyperlipidemia, unspecified: Secondary | ICD-10-CM | POA: Insufficient documentation

## 2022-04-01 DIAGNOSIS — R627 Adult failure to thrive: Secondary | ICD-10-CM | POA: Diagnosis not present

## 2022-04-01 DIAGNOSIS — H3530 Unspecified macular degeneration: Secondary | ICD-10-CM | POA: Insufficient documentation

## 2022-04-01 DIAGNOSIS — I429 Cardiomyopathy, unspecified: Secondary | ICD-10-CM | POA: Insufficient documentation

## 2022-04-01 DIAGNOSIS — I69351 Hemiplegia and hemiparesis following cerebral infarction affecting right dominant side: Secondary | ICD-10-CM | POA: Diagnosis not present

## 2022-04-01 DIAGNOSIS — J189 Pneumonia, unspecified organism: Secondary | ICD-10-CM

## 2022-04-01 LAB — CBC WITH DIFFERENTIAL (CANCER CENTER ONLY)
Abs Immature Granulocytes: 0.58 10*3/uL — ABNORMAL HIGH (ref 0.00–0.07)
Basophils Absolute: 0.1 10*3/uL (ref 0.0–0.1)
Basophils Relative: 1 %
Eosinophils Absolute: 0 10*3/uL (ref 0.0–0.5)
Eosinophils Relative: 0 %
HCT: 43.2 % (ref 39.0–52.0)
Hemoglobin: 14.6 g/dL (ref 13.0–17.0)
Immature Granulocytes: 7 %
Lymphocytes Relative: 9 %
Lymphs Abs: 0.7 10*3/uL (ref 0.7–4.0)
MCH: 30.1 pg (ref 26.0–34.0)
MCHC: 33.8 g/dL (ref 30.0–36.0)
MCV: 89.1 fL (ref 80.0–100.0)
Monocytes Absolute: 0.4 10*3/uL (ref 0.1–1.0)
Monocytes Relative: 4 %
Neutro Abs: 6.8 10*3/uL (ref 1.7–7.7)
Neutrophils Relative %: 79 %
Platelet Count: 369 10*3/uL (ref 150–400)
RBC: 4.85 MIL/uL (ref 4.22–5.81)
RDW: 15.7 % — ABNORMAL HIGH (ref 11.5–15.5)
WBC Count: 8.6 10*3/uL (ref 4.0–10.5)
nRBC: 0 % (ref 0.0–0.2)

## 2022-04-01 LAB — URINALYSIS, ROUTINE W REFLEX MICROSCOPIC
Bacteria, UA: NONE SEEN
Bilirubin Urine: NEGATIVE
Glucose, UA: >=500 mg/dL — AB
Hgb urine dipstick: NEGATIVE
Ketones, ur: 5 mg/dL — AB
Leukocytes,Ua: NEGATIVE
Nitrite: NEGATIVE
Protein, ur: NEGATIVE mg/dL
Specific Gravity, Urine: 1.038 — ABNORMAL HIGH (ref 1.005–1.030)
pH: 5 (ref 5.0–8.0)

## 2022-04-01 LAB — BASIC METABOLIC PANEL
Anion gap: 8 (ref 5–15)
BUN: 27 mg/dL — ABNORMAL HIGH (ref 8–23)
CO2: 26 mmol/L (ref 22–32)
Calcium: 8.7 mg/dL — ABNORMAL LOW (ref 8.9–10.3)
Chloride: 104 mmol/L (ref 98–111)
Creatinine, Ser: 0.64 mg/dL (ref 0.61–1.24)
GFR, Estimated: >60 mL/min (ref >60)
Glucose, Bld: 211 mg/dL — ABNORMAL HIGH (ref 70–99)
Potassium: 4.3 mmol/L (ref 3.5–5.1)
Sodium: 138 mmol/L (ref 135–145)

## 2022-04-01 LAB — TSH: TSH: 1.341 u[IU]/mL (ref 0.320–4.118)

## 2022-04-01 LAB — RESP PANEL BY RT-PCR (FLU A&B, COVID) ARPGX2
Influenza A by PCR: NEGATIVE
Influenza B by PCR: NEGATIVE
SARS Coronavirus 2 by RT PCR: NEGATIVE

## 2022-04-01 LAB — TROPONIN I (HIGH SENSITIVITY)
Troponin I (High Sensitivity): 8 ng/L (ref <18)
Troponin I (High Sensitivity): 8 ng/L (ref <18)

## 2022-04-01 LAB — BRAIN NATRIURETIC PEPTIDE: B Natriuretic Peptide: 28.6 pg/mL (ref 0.0–100.0)

## 2022-04-01 LAB — LACTIC ACID, PLASMA
Lactic Acid, Venous: 2 mmol/L (ref 0.5–1.9)
Lactic Acid, Venous: 2.4 mmol/L (ref 0.5–1.9)

## 2022-04-01 LAB — VITAMIN D 25 HYDROXY (VIT D DEFICIENCY, FRACTURES): Vit D, 25-Hydroxy: 15.88 ng/mL — ABNORMAL LOW (ref 30–100)

## 2022-04-01 LAB — I-STAT CREATININE, ED: Creatinine, Ser: 0.7 mg/dL (ref 0.61–1.24)

## 2022-04-01 LAB — SEDIMENTATION RATE: Sed Rate: 41 mm/hr — ABNORMAL HIGH (ref 0–16)

## 2022-04-01 MED ORDER — ENOXAPARIN SODIUM 40 MG/0.4ML IJ SOSY
40.0000 mg | PREFILLED_SYRINGE | INTRAMUSCULAR | Status: DC
  Administered 2022-04-01: 40 mg via SUBCUTANEOUS
  Filled 2022-04-01: qty 0.4

## 2022-04-01 MED ORDER — ACETAMINOPHEN 650 MG RE SUPP: 650.0000 mg | Freq: Four times a day (QID) | RECTAL | Status: DC | PRN

## 2022-04-01 MED ORDER — HYDROCORTISONE SOD SUC (PF) 100 MG IJ SOLR
100.0000 mg | Freq: Once | INTRAMUSCULAR | Status: AC
  Administered 2022-04-01: 100 mg via INTRAVENOUS
  Filled 2022-04-01: qty 2

## 2022-04-01 MED ORDER — MAGNESIUM GLUCONATE 500 MG PO TABS
500.0000 mg | ORAL_TABLET | Freq: Every day | ORAL | Status: DC
  Administered 2022-04-01: 500 mg via ORAL
  Filled 2022-04-01 (×2): qty 1

## 2022-04-01 MED ORDER — SODIUM CHLORIDE 0.9 % IV SOLN
500.0000 mg | Freq: Once | INTRAVENOUS | Status: AC
  Administered 2022-04-01: 500 mg via INTRAVENOUS
  Filled 2022-04-01: qty 5

## 2022-04-01 MED ORDER — SODIUM CHLORIDE 0.9 % IV BOLUS
500.0000 mL | Freq: Once | INTRAVENOUS | Status: AC
  Administered 2022-04-01: 500 mL via INTRAVENOUS

## 2022-04-01 MED ORDER — ALBUTEROL SULFATE (2.5 MG/3ML) 0.083% IN NEBU: 2.5000 mg | INHALATION_SOLUTION | Freq: Four times a day (QID) | RESPIRATORY_TRACT | Status: DC | PRN

## 2022-04-01 MED ORDER — ALBUTEROL SULFATE HFA 108 (90 BASE) MCG/ACT IN AERS: 2.0000 | INHALATION_SPRAY | RESPIRATORY_TRACT | Status: DC | PRN

## 2022-04-01 MED ORDER — SODIUM CHLORIDE 0.9% FLUSH: 3.0000 mL | Freq: Two times a day (BID) | INTRAVENOUS | Status: DC

## 2022-04-01 MED ORDER — IOHEXOL 350 MG/ML SOLN
80.0000 mL | Freq: Once | INTRAVENOUS | Status: AC | PRN
Start: 2022-04-01 — End: 2022-04-01
  Administered 2022-04-01: 80 mL via INTRAVENOUS

## 2022-04-01 MED ORDER — ACETAMINOPHEN 325 MG PO TABS: 650.0000 mg | ORAL_TABLET | Freq: Four times a day (QID) | ORAL | Status: DC | PRN

## 2022-04-01 MED ORDER — POLYETHYLENE GLYCOL 3350 17 G PO PACK: 17.0000 g | PACK | Freq: Every day | ORAL | Status: DC | PRN

## 2022-04-01 MED ORDER — SODIUM CHLORIDE 0.9 % IV SOLN
1.0000 g | Freq: Once | INTRAVENOUS | Status: AC
  Administered 2022-04-01: 1 g via INTRAVENOUS
  Filled 2022-04-01: qty 10

## 2022-04-01 MED ORDER — SODIUM CHLORIDE 0.9 % IV SOLN: INTRAVENOUS | Status: AC

## 2022-04-01 MED ORDER — DEXAMETHASONE 4 MG PO TABS
4.0000 mg | ORAL_TABLET | Freq: Every day | ORAL | Status: DC
  Administered 2022-04-02: 4 mg via ORAL
  Filled 2022-04-01: qty 1

## 2022-04-01 MED ORDER — SODIUM CHLORIDE 0.9 % IV BOLUS
1000.0000 mL | Freq: Once | INTRAVENOUS | Status: AC
  Administered 2022-04-01: 1000 mL via INTRAVENOUS

## 2022-04-01 MED ORDER — LEVETIRACETAM 500 MG PO TABS
1000.0000 mg | ORAL_TABLET | Freq: Two times a day (BID) | ORAL | Status: DC
  Administered 2022-04-01 – 2022-04-02 (×2): 1000 mg via ORAL
  Filled 2022-04-01 (×2): qty 2

## 2022-04-01 MED ORDER — BACLOFEN 10 MG PO TABS
10.0000 mg | ORAL_TABLET | Freq: Three times a day (TID) | ORAL | Status: DC
  Administered 2022-04-01 – 2022-04-02 (×3): 10 mg via ORAL
  Filled 2022-04-01 (×3): qty 1

## 2022-04-01 MED ORDER — TAMSULOSIN HCL 0.4 MG PO CAPS
0.4000 mg | ORAL_CAPSULE | Freq: Every day | ORAL | Status: DC
  Administered 2022-04-02: 0.4 mg via ORAL
  Filled 2022-04-01: qty 1

## 2022-04-01 NOTE — ED Provider Notes (Signed)
?  Physical Exam  ?BP 105/70 (BP Location: Right Arm)   Pulse 79   Temp 97.7 ?F (36.5 ?C) (Oral)   Resp 16   Ht '5\' 9"'$  (1.753 m)   Wt 82.1 kg   SpO2 96%   BMI 26.73 kg/m?  ? ?Physical Exam ? ?Procedures  ?Procedures ? ?ED Course / MDM  ?  ?Medical Decision Making ?Care assumed at 4 PM.  Patient is here with fever and shortness of breath and progressive weakness.  Patient is now wheelchair-bound and has difficulty transferring.  Oncology sent patient in for CT chest and CT head and also admission with PT and OT consult ? ?6:36 PM ?CT chest showed bronchiolitis versus pneumonia.  Patient's lactate is 2.0.  Given Rocephin and azithromycin.  Hospitalist to admit.  PT and OT consult can be done in the morning. ? ?Amount and/or Complexity of Data Reviewed ?Labs: ordered. Decision-making details documented in ED Course. ?Radiology: ordered and independent interpretation performed. Decision-making details documented in ED Course. ? ?Risk ?Prescription drug management. ? ? ? ? ? ? ? ?  ?Drenda Freeze, MD ?04/01/22 1836 ? ?

## 2022-04-01 NOTE — ED Notes (Signed)
Able to obtain one set of cultures before starting abx.  ?

## 2022-04-01 NOTE — Progress Notes (Signed)
? ?[facility] [address]  ?[place], [place] [address] ?[phone] ? ? ?Interval Evaluation ? ?Date of Service: 04/01/22 ?Patient Name: James Olsen ?Patient MRN: [id number] ?Patient DOB: Apr 23, 1946 ?Provider: [name], MD ? ?Identifying Statement:  ?James Olsen is a 76 y.o. 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 ?Oncologic History: ?08/18/18: Craniotomy, resection ([place]) ?01/24/19: Completes IMRT 55.8 Gy/30 fx to residual tumor James Olsen) ?06/21/22: Repeat craniotomy ([place]) ?12/26/21: Progression of tumor, fractionated [facility] James Olsen) ?03/11/22: Initiates avastin '10mg'$ /kg IV q3 weeks for refractory CNS inflammation  ? ?Interval History: ? James Olsen presents today for follow up given recent clinical decline.  He and his wife describe worsening of shortness of breath, extreme lethargy.  He has had low grade fever at least one evening.  It was extremely difficult even to transport him here because of functional impairment.  His frank left leg weakness is not necessarily worse from prior, although he is still very debilitated from it.  Decadron was restarted at '4mg'$  daily which has not helped at all.  Continues on Keppra '1000mg'$  twice per day, no recurrence of seizures.   ? ?H+P (01/20/19) Patient presented to medical attention in the winter of 2019 after noticing a "bulging" coming from the top of his head.  He also had experienced some numbness of his left leg at the time.  He obtained an MRI which demonstrated a dural based mass extending through the skull.  After a period of monitoring he underwent resection on 10/29/18 with Dr. Christella Olsen.  Post-operatively he was very weak on the right side and was found to have a stroke.  After extensive rehabilitation he regained most of his right sided function.  Because of residual tumor and histology (WHO II) radiation was started on 12/19 with Dr. Isidore Olsen.  Next week is his final session which he has  tolerated without ill effect. ? ?Medications: ?Current Outpatient Medications on File Prior to Visit  ?Medication Sig Dispense Refill  ? baclofen (LIORESAL) 10 MG tablet Take 1 tablet twice daily and 1 to 2 tablets at bedtime 360 tablet 2  ? dexamethasone (DECADRON) 2 MG tablet Take 2 tablets (4 mg total) by mouth daily. 90 tablet 2  ? levETIRAcetam (KEPPRA) 500 MG tablet Take 2 tablets (1,000 mg total) by mouth 2 (two) times daily. 60 tablet 3  ? magnesium gluconate (MAGONATE) 500 MG tablet Take 500 mg by mouth at bedtime.    ? Multiple Vitamins-Minerals (PRESERVISION AREDS 2) CAPS Take 1 capsule by mouth 2 (two) times daily.     ? silodosin (RAPAFLO) 8 MG CAPS capsule Take 8 mg by mouth daily with breakfast.    ? furosemide (LASIX) 20 MG tablet Take 1 tablet (20 mg total) by mouth 2 (two) times daily as needed for fluid or edema. (Patient not taking: Reported on 03/24/2022) 30 tablet 1  ? ?No current facility-administered medications on file prior to visit.  ? ? ?Allergies: No Known Allergies ?Past Medical History:  ?Past Medical History:  ?Diagnosis Date  ? Allergy   ? Arthritis   ? Cataract   ? GERD (gastroesophageal reflux disease)   ? Glaucoma   ? pt denies.   ? History of blood transfusion   ?  5 units after previous surgery  ? History of radiation therapy 12/09/18- 01/24/2019  ? Left sagittal brain, 11.8 Gy in 31 fractions for a total dose of 55.8 Gy  ? Hyperlipidemia   ?  Macular degeneration   ? Seizures ([place])   ? Stroke [facility])   ? after surgery in August 2019 mostly recovered still some weakness in left arm  ? Wears glasses   ? ?Past Surgical History:  ?Past Surgical History:  ?Procedure Laterality Date  ? APPLICATION OF CRANIAL NAVIGATION N/A 06/21/2021  ? Procedure: APPLICATION OF CRANIAL NAVIGATION;  Surgeon: [name], MD;  Location: [facility];  Service: Neurosurgery;  Laterality: N/A;  ? BRAIN SURGERY    ? CHOLECYSTECTOMY  2008  ? lap choli  ? COLONOSCOPY    ? CRANIOPLASTY N/A 10/29/2018  ? Procedure:  CRANIOPLASTY;  Surgeon: [name], MD;  Location: [facility];  Service: Neurosurgery;  Laterality: N/A;  CRANIOPLASTY  ? CRANIOTOMY N/A 08/18/2018  ? Procedure: CRANIOTOMY TUMOR EXCISION;  Surgeon: [name], MD;  Location: [place];  Service: Neurosurgery;  Laterality: N/A;  CRANIOTOMY TUMOR EXCISION  ? CRANIOTOMY Bilateral 06/21/2021  ? Procedure: Bilateral Craniotomy for tumor resection;  Surgeon: [name], MD;  Location: [facility] [place];  Service: Neurosurgery;  Laterality: Bilateral;  ? DIAGNOSTIC LAPAROSCOPY    ? EYE SURGERY    ? laser surgery to relieve pressure  ? NAILBED REPAIR Left 05/04/2014  ? Procedure: LEFT INDEX NAIL ABLATION V-Y FLAP COVERAGE;  Surgeon: [name]., MD;  Location: [facility];  Service: Orthopedics;  Laterality: Left;  index  ? PILONIDAL CYST EXCISION    ? x2  ? TONSILLECTOMY    ? VASECTOMY    ? ?Social History:  ?Social History  ? ?Socioeconomic History  ? Marital status: Married  ?  Spouse name: Not on file  ? Number of children: Not on file  ? Years of education: Not on file  ? Highest education level: Not on file  ?Occupational History  ? Not on file  ?Tobacco Use  ? Smoking status: Never  ? Smokeless tobacco: Never  ?Vaping Use  ? Vaping Use: Never used  ?Substance and Sexual Activity  ? Alcohol use: Yes  ?  Comment: occasionally  ? Drug use: No  ? Sexual activity: Not Currently  ?Other Topics Concern  ? Not on file  ?Social History Narrative  ? Not on file  ? ?Social Determinants of Health  ? ?Financial Resource Strain: Not on file  ?Food Insecurity: Not on file  ?Transportation Needs: Not on file  ?Physical Activity: Not on file  ?Stress: Not on file  ?Social Connections: Not on file  ?Intimate Partner Violence: Not on file  ? ?Family History:  ?Family History  ?Problem Relation Age of Onset  ? Diabetes Mother   ? Skin cancer Brother   ? Congenital heart disease Brother   ? ? ?Review of Systems: ?Constitutional: Denies fevers, chills or abnormal weight  loss ?Eyes: Denies blurriness of vision ?Ears, nose, mouth, throat, and face: Denies mucositis or sore throat ?Respiratory: Denies cough, dyspnea or wheezes ?Cardiovascular: Denies palpitation, chest discomfort or lower extremity swelling ?Gastrointestinal:  Denies nausea, constipation, diarrhea ?GU: Denies dysuria or incontinence ?Skin: Denies abnormal skin rashes ?Neurological: Per HPI ?Musculoskeletal: Denies joint pain, back or neck discomfort. No decrease in ROM ?Behavioral/Psych: Denies anxiety, disturbance in thought content, and mood instability ? ?Physical Exam: ?Vitals:  ? 04/01/22 1225  ?BP: 101/78  ?Pulse: 87  ?Resp: 16  ?Temp: (!) 97.5 ?F (36.4 ?C)  ?SpO2: 97%  ? ?KPS: 60. ?General: Alert, cooperative, pleasant, in no acute distress ?Head: Craniotomy scar noted, dry and intact. ?EENT: No conjunctival injection or scleral icterus.  Oral mucosa moist ?Lungs: Increased work and rate of breathing ?Cardiac: Regular rate and rhythm ?Abdomen: Soft, non-distended abdomen ?Skin: No rashes cyanosis or petechiae. ?Extremities: +edema ? ?Neurologic Exam: ?Mental Status: Drowsy, alert with stimulation. Oriented to self and environment. Language is fluent with intact comprehension.  ?Cranial Nerves: Visual acuity is grossly normal. Visual fields are full. Extra-ocular movements intact. No ptosis. Face is symmetric, tongue midline. ?Motor: Tone and bulk are normal. Power is 3/5 in right arm and leg. Reflexes are symmetric, no pathologic reflexes present. Intact finger to nose bilaterally ?Sensory: Intact to light touch and temperature ?Gait: Hemiparetic, walker assisted ? ?Labs: ?I have reviewed the data as listed ?   ?Component Value Date/Time  ? NA 137 03/17/2022 1135  ? K 4.2 03/17/2022 1135  ? CL 97 (L) 03/17/2022 1135  ? CO2 28 03/17/2022 1135  ? GLUCOSE 159 (H) 03/17/2022 1135  ? BUN 26 (H) 03/17/2022 1135  ? CREATININE 0.87 03/17/2022 1135  ? CREATININE 0.84 12/03/2021 1228  ? CALCIUM 8.9 03/17/2022 1135  ? PROT  5.2 (L) 06/27/2021 0516  ? ALBUMIN 2.9 (L) 06/27/2021 0516  ? AST 21 06/27/2021 0516  ? ALT 46 (H) 06/27/2021 0516  ? ALKPHOS 55 06/27/2021 0516  ? BILITOT 0.8 06/27/2021 0516  ? GFRNONAA >60 03/17/2022 1135  ? GFRNO

## 2022-04-01 NOTE — ED Triage Notes (Signed)
Per pt, states he was sent by cancer MD die to increase in SOB for 1 week-states he has had a decline in his mobility and performing basic ADLs since 3/27 as a result of his cancer-respiratory issues are new ?

## 2022-04-01 NOTE — ED Notes (Signed)
Transported to CT via stretcher

## 2022-04-01 NOTE — ED Provider Notes (Signed)
?[facility] DEPT ?Provider Note ? ? ?CSN: [id number] ?Arrival date & time: 04/01/22  1339 ? ?  ? ?History ? ?Chief Complaint  ?Patient presents with  ? Shortness of Breath  ? ? ?James Olsen is a 76 y.o. male. ? ? ?Shortness of Breath ?Patient presents with shortness of breath and general fatigue.  Has been going for the last 3 weeks.  History of meningioma that required surgery and then radiation.  Does have some chronic right-sided weakness because of it.  However has been much more weak overall.  Requiring help to get up where he would normally would not.  Seen by Dr. Mickeal Skinner and sent in for further evaluation.  Week ago that had some fevers.  No longer having fevers.  No chest pain.  No headaches. ?  ?Past Medical History:  ?Diagnosis Date  ? Allergy   ? Arthritis   ? Cataract   ? GERD (gastroesophageal reflux disease)   ? Glaucoma   ? pt denies.   ? History of blood transfusion   ?  5 units after previous surgery  ? History of radiation therapy 12/09/18- 01/24/2019  ? Left sagittal brain, 11.8 Gy in 31 fractions for a total dose of 55.8 Gy  ? Hyperlipidemia   ? Macular degeneration   ? Seizures ([place])   ? Stroke [facility])   ? after surgery in August 2019 mostly recovered still some weakness in left arm  ? Wears glasses   ? ?.psh ?Home Medications ?Prior to Admission medications   ?Medication Sig Start Date End Date Taking? Authorizing Provider  ?baclofen (LIORESAL) 10 MG tablet Take 1 tablet twice daily and 1 to 2 tablets at bedtime 12/11/21   Meredith Staggers, MD  ?dexamethasone (DECADRON) 2 MG tablet Take 2 tablets (4 mg total) by mouth daily. 02/20/22   [name], MD  ?furosemide (LASIX) 20 MG tablet Take 1 tablet (20 mg total) by mouth 2 (two) times daily as needed for fluid or edema. ?Patient not taking: Reported on 03/24/2022 03/11/22   [name], MD  ?levETIRAcetam (KEPPRA) 500 MG tablet Take 2 tablets (1,000 mg total) by mouth 2 (two) times daily. 02/11/22   Truddie Hidden, MD  ?magnesium gluconate (MAGONATE) 500 MG tablet Take 500 mg by mouth at bedtime.    [provider]  ?Multiple Vitamins-Minerals (PRESERVISION AREDS 2) CAPS Take 1 capsule by mouth 2 (two) times daily.     [provider]  ?silodosin (RAPAFLO) 8 MG CAPS capsule Take 8 mg by mouth daily with breakfast.    [provider]  ?   ? ?Allergies    ?Patient has no known allergies.   ? ?Review of Systems   ?Review of Systems  ?Constitutional:  Positive for appetite change and fatigue.  ?Respiratory:  Positive for shortness of breath.   ?Neurological:  Positive for weakness.  ? ?Physical Exam ?Updated Vital Signs ?BP 99/74 (BP Location: Right Arm)   Pulse 87   Temp 97.7 ?F (36.5 ?C) (Oral)   Resp 13   Ht '5\' 9"'$  (1.753 m)   Wt 82.1 kg   SpO2 96%   BMI 26.73 kg/m?  ?Physical Exam ?Vitals reviewed.  ?HENT:  ?   Head:  ?   Comments: Chronic changes on scalp with bumps and postsurgical scar. ?Cardiovascular:  ?   Rate and Rhythm: Normal rate and regular rhythm.  ?Pulmonary:  ?   Breath sounds: No wheezing or rhonchi.  ?Chest:  ?  Chest wall: No tenderness.  ?Abdominal:  ?   Tenderness: There is no abdominal tenderness.  ?Skin: ?   Comments: Poor capillary refill of right foot.  Does have pulse intact bilaterally however.  More blanching of left foot also with pulse intact.  Both somewhat cool.  ?Neurological:  ?   Mental Status: He is alert.  ?   Comments: Awake and appropriate with somewhat slow to answer.  Chronically weak on right compared to left.  ? ? ?ED Results / Procedures / Treatments   ?Labs ?(all labs ordered are listed, but only abnormal results are displayed) ?Labs Reviewed  ?RESP PANEL BY RT-PCR (FLU A&B, COVID) ARPGX2  ?BRAIN NATRIURETIC PEPTIDE  ?BASIC METABOLIC PANEL  ?URINALYSIS, ROUTINE W REFLEX MICROSCOPIC  ?I-STAT CREATININE, ED  ?TROPONIN I (HIGH SENSITIVITY)  ? ? ?EKG ?None ? ?Radiology ?DG Chest 1 View ? ?Result Date: 04/01/2022 ?CLINICAL DATA:  Shortness of  breath.  Weakness. EXAM: CHEST  1 VIEW COMPARISON:  03/17/2022 FINDINGS: The right lung is clear without focal pneumonia, edema, pneumothorax or pleural effusion. Atelectasis or pneumonia is identified in the peripheral left base. The cardiopericardial silhouette is within normal limits for size. Bones are diffusely demineralized. IMPRESSION: Small focus of airspace disease at the left base, suspicious for pneumonia although atelectasis could have this appearance. Electronically Signed   By: Misty Stanley M.D.   On: 04/01/2022 15:29   ? ?Procedures ?Procedures  ? ? ?Medications Ordered in ED ?Medications  ?albuterol (VENTOLIN HFA) 108 (90 Base) MCG/ACT inhaler 2 puff (has no administration in time range)  ?iohexol (OMNIPAQUE) 350 MG/ML injection 80 mL (80 mLs Intravenous Contrast Given 04/01/22 1548)  ? ? ?ED Course/ Medical Decision Making/ A&P ?  ?                        ?Medical Decision Making ?Amount and/or Complexity of Data Reviewed ?Radiology: ordered. ? ?Risk ?Prescription drug management. ? ? ?Patient presents with generalized weakness.  Has been going for the last 3 weeks.  Sent in by Dr. Mickeal Skinner from neurooncology.  Has known meningioma.  Chest x-ray independently interpreted and showed potential pneumonia in left lower lobe.  Reviewed blood work from earlier today and B12 mildly low.  TSH reassuring.  Will add COVID testing also.  CT head and CT angiography of the chest still pending.  Mild hypotension.  Potential require admission to the hospital for generalized weakness.  Dr. Mickeal Skinner requested PT and OT consult.  Care turned over to Dr. Darl Householder ? ? ? ? ? ? ? ?Final Clinical Impression(s) / ED Diagnoses ?Final diagnoses:  ?None  ? ? ?Rx / DC Orders ?ED Discharge Orders   ? ? None  ? ?  ? ? ?  ?Davonna Belling, MD ?04/01/22 1611 ? ?

## 2022-04-01 NOTE — Progress Notes (Signed)
Patient was brought to Emergency Room Triage and was redirected to Registration and then waiting.  Unable to give report.   ?

## 2022-04-01 NOTE — H&P (Signed)
?History and Physical  ? ?James Olsen PYP:[id number] DOB: 25-Apr-1946 DOA: 04/01/2022 ? ?PCP: Allwardt, Randa Evens, PA-C  ? ?Patient coming from: Oncology office ? ?Chief Complaint: Shortness of breath, fatigue ? ?HPI: James Olsen is a 76 y.o. 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 ? ?Patient experiencing 3 weeks of shortness of breath and fatigue.  He does have some chronic right-sided weakness in the setting of stroke history.  He saw his oncologist today and sent to the ED for further evaluation given his ongoing fatigue shortness of breath and reported fevers.  Does report a fever to 101 at home. ? ?He denies chills, chest pain, abdominal pain, constipation, diarrhea, nausea, vomiting. ? ?ED Course: Vital signs in the ED significant for blood pressure in the 100s.  Lab work-up included BMP with BUN 27, glucose 211, calcium 8.7.  CBC within normal limits.  BMP normal.  Troponin normal x2.  Lactic acid mildly elevated at 2 and 2.4 respectively.  Respiratory panel for flu and COVID-negative.  Urinalysis and blood cultures pending.  Chest x-ray showing airspace disease of the left base consistent with pneumonia versus atelectasis.  CT of the head showed stable surgical changes and stable residual meningioma with no acute abnormality.  CT of the chest with contrast showed no PE.  Did show groundglass opacity and mosaic pattern representing pulmonary edema which is asymmetric versus airspace disease with differential including asthma, hypersensitivity, bronchitis, cryptogenic organizing pneumonia.  Patient received ceftriaxone, azithromycin, Solu-Medrol, a liter of fluids, albuterol in the ED. ? ?Review of Systems: As per HPI otherwise all other systems reviewed and are negative. ? ?Past Medical History:  ?Diagnosis  Date  ? Allergy   ? Arthritis   ? Cataract   ? GERD (gastroesophageal reflux disease)   ? Glaucoma   ? pt denies.   ? History of blood transfusion   ?  5 units after previous surgery  ? History of radiation therapy 12/09/18- 01/24/2019  ? Left sagittal brain, 11.8 Gy in 31 fractions for a total dose of 55.8 Gy  ? Hyperlipidemia   ? Macular degeneration   ? Seizures ([place])   ? Stroke [facility])   ? after surgery in August 2019 mostly recovered still some weakness in left arm  ? Wears glasses   ? ? ?Past Surgical History:  ?Procedure Laterality Date  ? APPLICATION OF CRANIAL NAVIGATION N/A 06/21/2021  ? Procedure: APPLICATION OF CRANIAL NAVIGATION;  Surgeon: [name], MD;  Location: [place];  Service: Neurosurgery;  Laterality: N/A;  ? BRAIN SURGERY    ? CHOLECYSTECTOMY  2008  ? lap choli  ? COLONOSCOPY    ? CRANIOPLASTY N/A 10/29/2018  ? Procedure: CRANIOPLASTY;  Surgeon: [name], MD;  Location: [place];  Service: Neurosurgery;  Laterality: N/A;  CRANIOPLASTY  ? CRANIOTOMY N/A 08/18/2018  ? Procedure: CRANIOTOMY TUMOR EXCISION;  Surgeon: [name], MD;  Location: [place];  Service: Neurosurgery;  Laterality: N/A;  CRANIOTOMY TUMOR EXCISION  ? CRANIOTOMY Bilateral 06/21/2021  ? Procedure: Bilateral Craniotomy for tumor resection;  Surgeon: [name], MD;  Location: [place];  Service: Neurosurgery;  Laterality: Bilateral;  ? DIAGNOSTIC LAPAROSCOPY    ? EYE SURGERY    ? laser surgery to relieve pressure  ? NAILBED REPAIR Left 05/04/2014  ? Procedure: LEFT INDEX NAIL ABLATION V-Y FLAP COVERAGE;  Surgeon:  [name]., MD;  Location: [place] [facility];  Service: Orthopedics;  Laterality: Left;  index  ? PILONIDAL CYST EXCISION    ? x2  ? TONSILLECTOMY    ? VASECTOMY    ? ? ?Social History ? reports that he has never smoked. He has never used smokeless tobacco. He reports current alcohol use. He reports that he does not use drugs. ? ?No Known Allergies ? ?Family History  ?Problem Relation Age of Onset  ?  Diabetes Mother   ? Skin cancer Brother   ? Congenital heart disease Brother   ?Reviewed on admission ? ?Prior to Admission medications   ?Medication Sig Start Date End Date Taking? Authorizing Provider  ?baclofen (LIORESAL) 10 MG tablet Take 1 tablet twice daily and 1 to 2 tablets at bedtime 12/11/21  Yes Meredith Staggers, MD  ?dexamethasone (DECADRON) 2 MG tablet Take 2 tablets (4 mg total) by mouth daily. 02/20/22  Yes Vaslow, Acey Lav, MD  ?levETIRAcetam (KEPPRA) 500 MG tablet Take 2 tablets (1,000 mg total) by mouth 2 (two) times daily. 02/11/22  Yes Truddie Hidden, MD  ?magnesium gluconate (MAGONATE) 500 MG tablet Take 500 mg by mouth at bedtime.   Yes [provider]  ?Multiple Vitamins-Minerals (PRESERVISION AREDS 2) CAPS Take 1 capsule by mouth 2 (two) times daily.    Yes [provider]  ?silodosin (RAPAFLO) 8 MG CAPS capsule Take 8 mg by mouth daily with breakfast.   Yes [provider]  ?furosemide (LASIX) 20 MG tablet Take 1 tablet (20 mg total) by mouth 2 (two) times daily as needed for fluid or edema. ?Patient not taking: Reported on 03/24/2022 03/11/22   [name], MD  ? ? ?Physical Exam: ?Vitals:  ? 04/01/22 1730 04/01/22 1800 04/01/22 1830 04/01/22 1900  ?BP: 105/70 108/69 (!) 119/92 110/73  ?Pulse: 79 70 79 86  ?Resp: '16  15 15  '$ ?Temp:      ?TempSrc:      ?SpO2: 96% 98% 99% 95%  ?Weight:      ?Height:      ? ? ?Physical Exam ?Constitutional:   ?   General: He is not in acute distress. ?   Appearance: Normal appearance.  ?HENT:  ?   Head: Normocephalic and atraumatic.  ?   Mouth/Throat:  ?   Mouth: Mucous membranes are moist.  ?   Pharynx: Oropharynx is clear.  ?Eyes:  ?   Extraocular Movements: Extraocular movements intact.  ?   Pupils: Pupils are equal, round, and reactive to light.  ?Cardiovascular:  ?   Rate and Rhythm: Normal rate and regular rhythm.  ?   Pulses: Normal pulses.  ?   Heart sounds: Normal heart sounds.  ?Pulmonary:  ?   Effort: Pulmonary effort  is normal. No respiratory distress.  ?   Breath sounds: Rhonchi and rales present.  ?Abdominal:  ?   General: Bowel sounds are normal. There is no distension.  ?   Palpations: Abdomen is soft.  ?   Tenderness: There is no abdominal tenderness.  ?Musculoskeletal:     ?   General: No swelling or deformity.  ?Skin: ?   General: Skin is warm and dry.  ?Neurological:  ?   Mental Status: Mental status is at baseline.  ?   Comments: Chronic right-sided weakness.  Also generally weak overall.  ? ?Labs on Admission: I have personally reviewed following labs and imaging studies ? ?CBC: ?Recent Labs  ?Lab 04/01/22 ?  1150  ?WBC 8.6  ?NEUTROABS 6.8  ?HGB 14.6  ?HCT 43.2  ?MCV 89.1  ?PLT 369  ? ? ?Basic Metabolic Panel: ?Recent Labs  ?Lab 04/01/22 ?1501 04/01/22 ?1545  ?NA 138  --   ?K 4.3  --   ?CL 104  --   ?CO2 26  --   ?GLUCOSE 211*  --   ?BUN 27*  --   ?CREATININE 0.64 0.70  ?CALCIUM 8.7*  --   ? ? ?GFR: ?Estimated Creatinine Clearance: 81 mL/min (by C-G formula based on SCr of 0.7 mg/dL). ? ?Liver Function Tests: ?No results for input(s): AST, ALT, ALKPHOS, BILITOT, PROT, ALBUMIN in the last 168 hours. ? ?Urine analysis: ?   ?Component Value Date/Time  ? [place] YELLOW 06/29/2021 2106  ? APPEARANCEUR HAZY (A) 06/29/2021 2106  ? LABSPEC 1.027 06/29/2021 2106  ? PHURINE 5.0 06/29/2021 2106  ? GLUCOSEU 50 (A) 06/29/2021 2106  ? HGBUR MODERATE (A) 06/29/2021 2106  ? [place] NEGATIVE 06/29/2021 2106  ? [place] NEGATIVE 06/29/2021 2106  ? PROTEINUR NEGATIVE 03/11/2022 1331  ? NITRITE NEGATIVE 06/29/2021 2106  ? LEUKOCYTESUR MODERATE (A) 06/29/2021 2106  ? ? ?Radiological Exams on Admission: ?DG Chest 1 View ? ?Result Date: 04/01/2022 ?CLINICAL DATA:  Shortness of breath.  Weakness. EXAM: CHEST  1 VIEW COMPARISON:  03/17/2022 FINDINGS: The right lung is clear without focal pneumonia, edema, pneumothorax or pleural effusion. Atelectasis or pneumonia is identified in the peripheral left base. The cardiopericardial silhouette  is within normal limits for size. Bones are diffusely demineralized. IMPRESSION: Small focus of airspace disease at the left base, suspicious for pneumonia although atelectasis could have this appearance.

## 2022-04-01 NOTE — ED Provider Triage Note (Signed)
Emergency Medicine Provider Triage Evaluation Note ? ?James Olsen , a 76 y.o. male  was evaluated in triage.  Pt complains of shortness of breath and gradual weakness.  Right leg pain/poor perfusion.  Increasing weakness and decreased independence.  Decreased appetite.  Being treated for meningioma.  Some labs drawn this morning at cancer center.  Dr. Mickeal Skinner suspicious of PE due to prolonged immobility and cancer treatment.  Avastin held today due to symptoms.  Urged to be evaluated in ED.  Denies recent ill contacts or symptoms.   ? ?Review of Systems  ?Positive: As above ?Negative: As above ? ?Physical Exam  ?BP 111/66 (BP Location: Left Arm)   Pulse 86   Temp 97.7 ?F (36.5 ?C) (Oral)   Resp 18   Ht '5\' 9"'$  (1.753 m)   Wt 82.1 kg   SpO2 97%   BMI 26.73 kg/m?  ?Gen:   Awake, no distress, appears clinically dehydrated ?Resp:  Normal effort, CTAB  ?MSK:   Moves extremities with some difficulty ?Other:  Mild decreased capillary refill of right foot.  DP pulses and radial pulses 2+ bilaterally. ? ?Medical Decision Making  ?Medically screening exam initiated at 2:42 PM.  Appropriate orders placed.  James Olsen was informed that the remainder of the evaluation will be completed by another provider, this initial triage assessment does not replace that evaluation, and the importance of remaining in the ED until their evaluation is complete. ? ?Labs, imaging, EKG ordered ?  ?Prince Rome, PA-C ?78/93/81 1501 ? ?

## 2022-04-02 ENCOUNTER — Ambulatory Visit: Payer: Medicare Other | Admitting: Physical Medicine & Rehabilitation

## 2022-04-02 ENCOUNTER — Observation Stay (HOSPITAL_BASED_OUTPATIENT_CLINIC_OR_DEPARTMENT_OTHER): Payer: Medicare Other

## 2022-04-02 DIAGNOSIS — I428 Other cardiomyopathies: Secondary | ICD-10-CM

## 2022-04-02 DIAGNOSIS — E872 Acidosis, unspecified: Secondary | ICD-10-CM

## 2022-04-02 DIAGNOSIS — R531 Weakness: Secondary | ICD-10-CM | POA: Diagnosis not present

## 2022-04-02 DIAGNOSIS — R0602 Shortness of breath: Secondary | ICD-10-CM

## 2022-04-02 LAB — COMPREHENSIVE METABOLIC PANEL
ALT: 44 U/L (ref 0–44)
AST: 20 U/L (ref 15–41)
Albumin: 2.3 g/dL — ABNORMAL LOW (ref 3.5–5.0)
Alkaline Phosphatase: 66 U/L (ref 38–126)
Anion gap: 4 — ABNORMAL LOW (ref 5–15)
BUN: 21 mg/dL (ref 8–23)
CO2: 25 mmol/L (ref 22–32)
Calcium: 8.1 mg/dL — ABNORMAL LOW (ref 8.9–10.3)
Chloride: 113 mmol/L — ABNORMAL HIGH (ref 98–111)
Creatinine, Ser: 0.72 mg/dL (ref 0.61–1.24)
GFR, Estimated: >60 mL/min (ref >60)
Glucose, Bld: 126 mg/dL — ABNORMAL HIGH (ref 70–99)
Potassium: 3.8 mmol/L (ref 3.5–5.1)
Sodium: 142 mmol/L (ref 135–145)
Total Bilirubin: 0.6 mg/dL (ref 0.3–1.2)
Total Protein: 4.9 g/dL — ABNORMAL LOW (ref 6.5–8.1)

## 2022-04-02 LAB — ECHOCARDIOGRAM COMPLETE
Area-P 1/2: 3.42 cm2
Calc EF: 54.7 %
Height: 69 in
S' Lateral: 2.9 cm
Single Plane A2C EF: 59.7 %
Single Plane A4C EF: 54.5 %
Weight: 3100.55 oz

## 2022-04-02 LAB — RESPIRATORY PANEL BY PCR

## 2022-04-02 LAB — CBC
HCT: 38.1 % — ABNORMAL LOW (ref 39.0–52.0)
Hemoglobin: 12.6 g/dL — ABNORMAL LOW (ref 13.0–17.0)
MCH: 31 pg (ref 26.0–34.0)
MCHC: 33.1 g/dL (ref 30.0–36.0)
MCV: 93.6 fL (ref 80.0–100.0)
Platelets: 329 10*3/uL (ref 150–400)
RBC: 4.07 MIL/uL — ABNORMAL LOW (ref 4.22–5.81)
RDW: 15.5 % (ref 11.5–15.5)
WBC: 6.9 10*3/uL (ref 4.0–10.5)
nRBC: 0 % (ref 0.0–0.2)

## 2022-04-02 LAB — LACTIC ACID, PLASMA: Lactic Acid, Venous: 1.7 mmol/L (ref 0.5–1.9)

## 2022-04-02 LAB — VITAMIN B1: Vitamin B1 (Thiamine): 108.6 nmol/L (ref 66.5–200.0)

## 2022-04-02 LAB — TESTOSTERONE: Testosterone: 64 ng/dL — ABNORMAL LOW (ref 264–916)

## 2022-04-02 LAB — BRAIN NATRIURETIC PEPTIDE: B Natriuretic Peptide: 58.2 pg/mL (ref 0.0–100.0)

## 2022-04-02 LAB — PROCALCITONIN
Procalcitonin: <0.1 ng/mL
Procalcitonin: <0.1 ng/mL

## 2022-04-02 MED ORDER — SENNOSIDES-DOCUSATE SODIUM 8.6-50 MG PO TABS: 1.0000 | ORAL_TABLET | Freq: Every evening | ORAL | Status: DC | PRN

## 2022-04-02 MED ORDER — VITAMIN D 25 MCG (1000 UNIT) PO TABS
1000.0000 [IU] | ORAL_TABLET | Freq: Every day | ORAL | Status: DC
  Administered 2022-04-02: 1000 [IU] via ORAL
  Filled 2022-04-02: qty 1

## 2022-04-02 MED ORDER — DM-GUAIFENESIN ER 30-600 MG PO TB12
1.0000 | ORAL_TABLET | Freq: Two times a day (BID) | ORAL | Status: DC
  Administered 2022-04-02: 1 via ORAL
  Filled 2022-04-02: qty 1

## 2022-04-02 MED ORDER — METOPROLOL TARTRATE 5 MG/5ML IV SOLN: 5.0000 mg | INTRAVENOUS | Status: DC | PRN

## 2022-04-02 MED ORDER — TRAZODONE HCL 50 MG PO TABS
50.0000 mg | ORAL_TABLET | Freq: Every evening | ORAL | Status: DC | PRN
Start: 2022-04-02 — End: 2022-04-02

## 2022-04-02 MED ORDER — HYDRALAZINE HCL 20 MG/ML IJ SOLN
10.0000 mg | INTRAMUSCULAR | Status: DC | PRN
Start: 2022-04-02 — End: 2022-04-02

## 2022-04-02 MED ORDER — IPRATROPIUM-ALBUTEROL 0.5-2.5 (3) MG/3ML IN SOLN: 3.0000 mL | RESPIRATORY_TRACT | Status: DC | PRN

## 2022-04-02 MED ORDER — GUAIFENESIN 100 MG/5ML PO LIQD
5.0000 mL | ORAL | Status: DC | PRN
Start: 2022-04-02 — End: 2022-04-02

## 2022-04-02 NOTE — Assessment & Plan Note (Signed)
Chronic. 

## 2022-04-02 NOTE — Evaluation (Signed)
Physical Therapy Evaluation ?Patient Details ?Name: James Olsen ?MRN: [id number] ?DOB: 29-Mar-1946 ?Today's Date: 04/02/2022 ? ?History of Present Illness ? James Olsen is a 76 y.o. male presenting with generalized weakness and fatigue with associated shortness of breath for the past several weeks. 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 ? ?  ?Clinical Impression ? James Olsen is 77 y.o. male  admitted with above HPI and diagnosis. Patient is currently limited by functional impairments below (see PT problem list). Patient lives with his spouse and typically ambulates with rollator in home at baseline but has been limited since fall ~3 weeks ago. He currently requires min assist for bed mobility and mod assist for transfers and gait with RW. Patient will benefit from continued skilled PT interventions to address impairments and progress independence with mobility, recommending pt return home with assistance from spouse and HHPT. Acute PT will follow and progress as able.    ?   ? ?Recommendations for follow up therapy are one component of a multi-disciplinary discharge planning process, led by the attending physician.  Recommendations may be updated based on patient status, additional functional criteria and insurance authorization. ? ?PT Recommendation   ?Recommendations for Other Services OT consult Filed 04/02/2022 0800  ?Follow Up Recommendations Home health PT Filed 04/02/2022 0800  ?Patient can return home with the following A lot of help with walking and/or transfers, A lot of help with bathing/dressing/bathroom, Assistance with cooking/housework, Assistance with feeding, Direct supervision/assist for medications management, Direct supervision/assist for financial management, Assist for transportation, Help with stairs or ramp for entrance  Filed 04/02/2022 0800  ?Functional Status Assessment Patient has had a recent decline in their functional status and demonstrates the ability to make significant improvements in function in a reasonable and predictable amount of time. Filed 04/02/2022 0800  ? ? ?Mobility ? Bed Mobility ?Overal bed mobility: Needs Assistance ?Bed Mobility: Supine to Sit ?  ?  ?Supine to sit: Min assist, HOB elevated ?  ?  ?General bed mobility comments: cues to reach for bed rail and assist to bring Rt UE to rail. min assist with bed pad to scoot hips forward at EOB and to steady balance. ?  ? ?Transfers ?Overall transfer level: Needs assistance ?Equipment used: Rolling walker (2 wheels) ?Transfers: Sit to/from Stand ?Sit to Stand: From elevated surface, Mod assist ?  ?  ?  ?  ?  ?General transfer comment: Mod assist to power up from EOB and recliner. VC's for bil UE use to rise. pt with slight posterior lean but able to correct. ?  ? ?Ambulation/Gait ?Ambulation/Gait assistance: Min assist, Mod assist ?Gait Distance (Feet): 25 Feet ?Assistive device: Rolling walker (2 wheels) ?Gait Pattern/deviations: Step-through pattern, Decreased stride length, Decreased step length - right, Decreased dorsiflexion - right ?Gait velocity: decr ?  ?  ?General Gait Details: Min-Mod assist to manage RW and facilitate Rt LE swing through to increase step length and stabilize during stance phase. no buckling noted. pt fagitued quickly and seated rest provided. HR stable throughout. ? ?Stairs ?  ?  ?  ?  ?  ? ?Wheelchair Mobility ?  ? ?Modified Rankin (Stroke Patients Only) ?  ? ?  ? ?Balance Overall balance assessment: Needs assistance ?Sitting-balance support: Feet supported, Bilateral upper extremity supported ?Sitting balance-Leahy Scale: Fair ?  ?  ?Standing balance support: During functional activity, Bilateral upper extremity  supported ?Standing balance-Leahy Scale: Poor ?  ?  ?  ?  ?  ?  ?  ?  ?  ?  ?  ?  ?   ? ? ? ?Pertinent Vitals/Pain Pain  Assessment ?Pain Assessment: No/denies pain  ? ? ? ? 04/02/22 0800  ?PT Visit Information  ?Last PT Received On 04/02/22  ?Assistance Needed +1  ?History of Present Illness James Olsen is a 76 y.o. male presenting with generalized weakness and fatigue with associated shortness of breath for the past several weeks. 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  ?Precautions  ?Precautions Fall  ?Precaution Comments fall ~3 weeks ago, was walking and "feet just flew out from under him"  ?Restrictions  ?Weight Bearing Restrictions No  ?Home Living  ?Family/patient expects to be discharged to: Private residence  ?Living Arrangements Spouse/significant other  ?Available Help at Discharge Family;Available 24 hours/day  ?Type of Home House  ?Home Access Stairs to enter  ?Entrance Stairs-Number of Steps 3  ?Entrance Stairs-Rails Right;Left  ?Home Layout Able to live on main level with bedroom/bathroom;Two level;Full bath on main level  ?Alternate Level Stairs-Number of Steps stays on main level  ?Bathroom Shower/Tub Tub/shower unit;Walk-in shower  ?Bathroom Toilet Standard  ?Bathroom Accessibility Yes  ?Home Equipment Rollator (4 wheels);Shower seat - built in  ?Additional Comments loves gardening landscaping and has 2 cats (glove and ginger). pt has AFO for Rt foot.  ?Prior Function  ?Mobility Comments uses Rollator for mobility in and out of home.  ?ADLs Comments pts wife has been helping with dressing/bathing/ADLs  ?Communication  ?Communication Expressive difficulties  ?Pain Assessment  ?Pain Assessment No/denies pain  ?Cognition  ?Arousal/Alertness Awake/alert  ?Behavior During Therapy [facility] for tasks assessed/performed  ?Overall Cognitive Status Within Functional Limits for tasks assessed  ?Upper Extremity Assessment  ?Upper Extremity Assessment Defer to OT evaluation   ?Lower Extremity Assessment  ?Lower Extremity Assessment Generalized weakness;RLE deficits/detail  ?RLE Deficits / Details pt with foot drop noted and 3-/5 grossly throughout for MMT of hip/knee.  ?RLE Sensation WNL  ?RLE Coordination decreased gross motor  ?Cervical / Trunk Assessment  ?Cervical / Trunk Assessment Normal  ?Balance  ?Overall balance assessment Needs assistance  ?Sitting-balance support Feet supported;Bilateral upper extremity supported  ?Sitting balance-Leahy Scale Fair  ?Standing balance support During functional activity;Bilateral upper extremity supported  ?Standing balance-Leahy Scale Poor  ?PT - End of Session  ?Equipment Utilized During Treatment Gait belt  ?Activity Tolerance Patient tolerated treatment well  ?Patient left in chair;with call bell/phone within reach  ?Nurse Communication Mobility status  ?PT Assessment  ?PT Recommendation/Assessment Patient needs continued PT services  ?PT Visit Diagnosis Unsteadiness on feet (R26.81);Muscle weakness (generalized) (M62.81);Difficulty in walking, not elsewhere classified (R26.2);Hemiplegia and hemiparesis  ?Hemiplegia - Right/Left Right  ?Hemiplegia - dominant/non-dominant Dominant  ?Hemiplegia - caused by Cerebral infarction ?(chronic)  ?PT Problem List Decreased strength;Decreased range of motion;Decreased activity tolerance;Decreased mobility;Decreased balance;Decreased coordination;Decreased knowledge of use of DME;Decreased safety awareness;Decreased knowledge of precautions;Obesity  ?PT Plan  ?PT Frequency (ACUTE ONLY) Min 3X/week  ?PT Treatment/Interventions (ACUTE ONLY) DME instruction;Gait training;Stair training;Functional mobility training;Therapeutic activities;Therapeutic exercise;Balance training;Patient/family education;Neuromuscular re-education  ?AM-PAC PT "6 Clicks" Mobility Outcome Measure (Version 2)  ?Help needed turning from your back to your side while in a flat bed without using bedrails? 3  ?Help needed moving from  lying on your back to sitting on the  side of a flat bed without using bedrails? 3  ?Help needed moving to and from a bed to a chair (including a wheelchair)? 3  ?Help needed standing up from a chair using your

## 2022-04-02 NOTE — Assessment & Plan Note (Addendum)
Is resolved. possible for underlying bronchitis.  As needed bronchodilators.  Procalcitonin and respiratory panel is negative ?BNP negative, echocardiogram normal ?

## 2022-04-02 NOTE — Plan of Care (Signed)
?  Problem: Clinical Measurements: ?Goal: Will remain free from infection ?Outcome: Progressing ?Goal: Diagnostic test results will improve ?Outcome: Progressing ?Goal: Respiratory complications will improve ?Outcome: Progressing ?  ?Problem: Nutrition: ?Goal: Adequate nutrition will be maintained ?Outcome: Progressing ?  ?Problem: Safety: ?Goal: Ability to remain free from injury will improve ?Outcome: Progressing ?  ?Problem: Activity: ?Goal: Risk for activity intolerance will decrease ?Outcome: Not Progressing ?  ?

## 2022-04-02 NOTE — Assessment & Plan Note (Addendum)
PT OT recommended home health therefore arrangements made ?

## 2022-04-02 NOTE — Evaluation (Signed)
Occupational Therapy Evaluation ?Patient Details ?Name: James Olsen ?MRN: [id number] ?DOB: November 19, 1946 ?Today's Date: 04/02/2022 ? ? ?History of Present Illness James Olsen is a 76 y.o. male presenting with generalized weakness and fatigue with associated shortness of breath for the past several weeks. 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  ? ?Clinical Impression ?  ?James Olsen Is a 76 year old man with above medical history and presents with impaired balance, generalized weakness, decreased activity tolerance and impaired coordination. Patient reports he has been mostly modified independent at home with ADLs but is now requiring more assistance since fall. He had two posterior losses of balance with sitting at edge of bed and needed min assist to stand and steady himself. He was able to take steps with RW without LOB. Patient reports he is discharging today. Recommend HH Ot at discharge and at least intermittent supervision.  Patient will benefit from skilled OT services while in hospital to improve deficits and learn compensatory strategies as needed in order to return to PLOF.   ? ?   ? ?Recommendations for follow up therapy are one component of a multi-disciplinary discharge planning process, led by the attending physician.  Recommendations may be updated based on patient status, additional functional criteria and insurance authorization.  ? ?Follow Up Recommendations ? Home health OT  ?  ?Assistance Recommended at Discharge Intermittent Supervision/Assistance  ?Patient can return home with the following A little help with bathing/dressing/bathroom;A little help with walking and/or transfers;Assistance with cooking/housework;Direct supervision/assist for financial management;Direct supervision/assist for medications management;Help with  stairs or ramp for entrance;Assist for transportation ? ?  ?Functional Status Assessment ? Patient has had a recent decline in their functional status and demonstrates the ability to make significant improvements in function in a reasonable and predictable amount of time.  ?Equipment Recommendations ? None recommended by OT  ?  ?Recommendations for Other Services   ? ? ?  ?Precautions / Restrictions Precautions ?Precautions: Fall ?Precaution Comments: fall ~3 weeks ago, was walking and "feet just flew out from under him" ?Restrictions ?Weight Bearing Restrictions: No  ? ?  ? ?Mobility Bed Mobility ?  ?  ?  ?  ?  ?  ?  ?  ?  ? ?Transfers ?  ?  ?  ?  ?  ?  ?  ?  ?  ?  ?  ? ?  ?Balance Overall balance assessment: Needs assistance ?Sitting-balance support: No upper extremity supported, Feet supported ?Sitting balance-Leahy Scale: Poor ?Sitting balance - Comments: loss of balance x 2 at edge of bed ?  ?  ?  ?  ?  ?  ?  ?  ?  ?  ?  ?  ?  ?  ?  ?   ? ?ADL either performed or assessed with clinical judgement  ? ?ADL Overall ADL's : Needs assistance/impaired ?Eating/Feeding: Independent ?  ?Grooming: Min guard;Standing ?  ?Upper Body Bathing: Set up;Sitting ?  ?Lower Body Bathing: Set up;Minimal assistance;Sit to/from stand ?  ?Upper Body Dressing : Set up;Sitting ?  ?Lower Body Dressing: Sit to/from stand;Moderate assistance ?  ?Toilet Transfer: Rolling walker (2 wheels);Regular Toilet;Grab bars;Minimal assistance ?  ?Toileting- Water quality scientist and Hygiene: Min guard;Sit to/from stand ?  ?  ?  ?Functional mobility during ADLs: Minimal assistance;Rolling walker (2 wheels) ?General ADL Comments: Min assist to power up from low  surface, min guard to take steps with RW  ? ? ? ?Vision Baseline Vision/History: 1 Wears glasses ?   ?   ?Perception   ?  ?Praxis   ?  ? ?Pertinent Vitals/Pain Pain Assessment ?Pain Assessment: No/denies pain  ? ? ? ?Hand Dominance Left ?  ?Extremity/Trunk Assessment Upper Extremity  Assessment ?Upper Extremity Assessment: RUE deficits/detail;LUE deficits/detail ?RUE Deficits / Details: WFL, grossly 4/5 strength ?RUE Coordination:  (grossly functional coordination, Dysdiadochokinesia of RUE) ?LUE Deficits / Details: WNL ROM, 5/5 strength ?LUE Sensation: WNL ?LUE Coordination: WNL ?  ?Lower Extremity Assessment ?Lower Extremity Assessment: Defer to PT evaluation ?  ?Cervical / Trunk Assessment ?Cervical / Trunk Assessment: Normal ?  ?Communication Communication ?Communication: Expressive difficulties ?  ?Cognition Arousal/Alertness: Awake/alert ?Behavior During Therapy: [facility] for tasks assessed/performed ?Overall Cognitive Status: Within Functional Limits for tasks assessed ?  ?  ?  ?  ?  ?  ?  ?  ?  ?  ?  ?  ?  ?  ?  ?  ?  ?  ?  ?General Comments    ? ?  ?Exercises   ?  ?Shoulder Instructions    ? ? ?Home Living Family/patient expects to be discharged to:: Private residence ?Living Arrangements: Spouse/significant other ?Available Help at Discharge: Family;Available 24 hours/day ?Type of Home: House ?Home Access: Stairs to enter ?Entrance Stairs-Number of Steps: 3 ?Entrance Stairs-Rails: Right;Left ?Home Layout: Able to live on main level with bedroom/bathroom;Two level;Full bath on main level ?Alternate Level Stairs-Number of Steps: stays on main level ?  ?Bathroom Shower/Tub: Tub/shower unit;Walk-in shower ?  ?Bathroom Toilet: Standard ?Bathroom Accessibility: Yes ?  ?Home Equipment: Rollator (4 wheels);Shower seat - built in ?  ?Additional Comments: loves gardening landscaping and has 2 cats (glove and ginger). pt has AFO for Rt foot. ?  ? ?  ?Prior Functioning/Environment   ?  ?  ?  ?  ?  ?  ?Mobility Comments: uses Rollator for mobility in and out of home. ?ADLs Comments: pts wife has been helping with dressing/bathing/ADLs ?  ? ?  ?  ?OT Problem List: Decreased activity tolerance;Impaired balance (sitting and/or standing);Decreased coordination;Decreased knowledge of use of DME or  AE;Impaired UE functional use;Decreased strength ?  ?   ?OT Treatment/Interventions: Self-care/ADL training;Therapeutic exercise;Neuromuscular education;DME and/or AE instruction;Therapeutic activities;Balance training;Patient/family education  ?  ?OT Goals(Current goals can be found in the care plan section) Acute Rehab OT Goals ?Patient Stated Goal: improve balance ?OT Goal Formulation: With patient ?Time For Goal Achievement: 04/16/22 ?Potential to Achieve Goals: Good  ?OT Frequency: Min 2X/week ?  ? ?Co-evaluation   ?  ?  ?  ?  ? ?  ?AM-PAC OT "6 Clicks" Daily Activity     ?Outcome Measure Help from another person eating meals?: None ?Help from another person taking care of personal grooming?: A Little ?Help from another person toileting, which includes using toliet, bedpan, or urinal?: A Little ?Help from another person bathing (including washing, rinsing, drying)?: A Little ?Help from another person to put on and taking off regular upper body clothing?: A Little ?Help from another person to put on and taking off regular lower body clothing?: A Lot ?6 Click Score: 18 ?  ?End of Session Equipment Utilized During Treatment: Rolling walker (2 wheels) ?Nurse Communication: Mobility status ? ?Activity Tolerance: Patient tolerated treatment well ?Patient left: in bed;with call bell/phone within reach ? ?OT Visit Diagnosis: History of falling (Z91.81);Hemiplegia and hemiparesis ?Hemiplegia - Right/Left: Right ?Hemiplegia - dominant/non-dominant:  Non-Dominant ?Hemiplegia - caused by: Unspecified  ?              ?Time: 8032-1224 ?OT Time Calculation (min): 26 min ?Charges:  OT General Charges ?$OT Visit: 1 Visit ?OT Evaluation ?$OT Eval Moderate Complexity: 1 Mod ? ?Emery Dupuy, OTR/L ?Acute Care Rehab Services  ?Office [phone] ?Pager: [phone]  ? ?Khyra Viscuso L Jeani Fassnacht ?04/02/2022, 4:18 PM ?

## 2022-04-02 NOTE — Assessment & Plan Note (Signed)
Stable

## 2022-04-02 NOTE — Assessment & Plan Note (Signed)
Small recurrence seen which appears to be stable.  Recommend outpatient follow-up ?

## 2022-04-02 NOTE — Discharge Summary (Signed)
Physician Discharge Summary  ?James Olsen SAY:[id number] DOB: February 24, 1946 DOA: 04/01/2022 ? ?PCP: Allwardt, Randa Evens, PA-C ? ?Admit date: 04/01/2022 ?Discharge date: 04/02/2022 ? ?Admitted From: Home ?Disposition:  Home ? ?Recommendations for Outpatient Follow-up:  ?Follow up with PCP in 1-2 weeks ?Please obtain BMP/CBC in one week your next doctors visit.  ? ? ?Home Health: PT/OT ?Equipment/Devices: None ?Discharge Condition: Stable ?CODE STATUS: Full code ?Diet recommendation: Heart healthy ? ?Brief/Interim Summary: ?76 y.o. 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  Upon admission CTA of the chest showed concerns for possible atypical pneumonia.  Procalcitonin, respiratory panel was negative.  CT head showed stable surgical changes and residual meningioma.  During hospitalization patient received bronchodilator treatment.  Procalcitonin and respiratory panel was negative.  BNP was overall unremarkable, echocardiogram was normal.  The following day he was ambulating with physical therapy who recommended home health for him. ?Today patient is medically stable for discharge. ? ? ?Assessment and Plan: ?* Generalized weakness ?PT OT recommended home health therefore arrangements made ? ?Short of breath on exertion ?Is resolved. possible for underlying bronchitis.  As needed bronchodilators.  Procalcitonin and respiratory panel is negative ?BNP negative, echocardiogram normal ? ?Lactic acidosis ?Resolved ? ?S/P resection of meningioma ?Small recurrence seen which appears to be stable.  Recommend outpatient follow-up ? ?S/P craniotomy ?Stable ? ?Focal seizures ([place]) ?Continue home [place] ? ?Gastroesophageal reflux disease ?Supportive care, PPI as needed ? 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 ? ? ? ?  ?Body mass index is 28.62 kg/m?. ? ?  ? ? ? ?Discharge Diagnoses:  ?Principal Problem: ?  Generalized weakness ?Active Problems: ?  Short of breath on exertion ?  Lactic acidosis ?  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  Gastroesophageal reflux disease ?  Focal seizures ([place]) ?  S/P craniotomy ?  S/P resection of meningioma ? ? ? ? ? ?Consultations: ?None ? ?Subjective: ?Doing better, sob better no new complaints.  ? ?Discharge Exam: ?Vitals:  ? 04/02/22 0955 04/02/22 1400  ?BP: 121/67 122/71  ?Pulse: 93 85  ?Resp: 18 16  ?Temp: 98 ?F (36.7 ?C) 97.7 ?F (36.5 ?C)  ?SpO2: 97% 97%  ? ?Vitals:  ? 04/01/22 2235 04/02/22 0304 04/02/22 0955 04/02/22 1400  ?BP: 119/82 122/85 121/67 122/71  ?Pulse: 74 63 93 85  ?Resp: '18 18 18 16  '$ ?Temp: (!) 97.5 ?F (36.4 ?C) (!) 97.5 ?F (36.4 ?C) 98 ?F (36.7 ?C) 97.7 ?F (36.5 ?C)  ?TempSrc: Oral Oral Oral Oral  ?SpO2: 98% 96% 97% 97%  ?Weight:  87.9 kg    ?Height:      ? ? ?General: Pt is alert, awake, not in acute distress ?Cardiovascular: RRR, S1/S2 +, no rubs, no gallops ?Respiratory: CTA bilaterally, no wheezing, no rhonchi ?Abdominal: Soft, NT, ND, bowel sounds + ?Extremities: no edema, no cyanosis ? ?Discharge Instructions ? ? ?Allergies as of 04/02/2022   ?No Known Allergies ?  ? ?  ?Medication List  ?  ? ?STOP taking these medications   ? ?furosemide 20 MG tablet ?Commonly known as: LASIX ?  ? ?  ? ?TAKE these medications   ? ?baclofen 10 MG tablet ?Commonly known as: LIORESAL ?Take 1 tablet twice daily and 1 to 2 tablets at bedtime ?  ?  dexamethasone 2 MG tablet ?Commonly known as: DECADRON ?Take 2 tablets (4 mg total) by mouth daily. ?  ?levETIRAcetam 500 MG tablet ?Commonly known as: KEPPRA ?Take 2 tablets (1,000 mg total) by mouth 2 (two) times daily. ?  ?magnesium gluconate 500 MG tablet ?Commonly known as: MAGONATE ?Take 500 mg by mouth at bedtime. ?  ?PreserVision AREDS 2 Caps ?Take 1 capsule by mouth 2 (two)  times daily. ?  ?silodosin 8 MG Caps capsule ?Commonly known as: RAPAFLO ?Take 8 mg by mouth daily with breakfast. ?  ? ?  ? ? Follow-up Information   ? ? Allwardt, Alyssa M, PA-C Follow up in 1 week(s).   ?Specialty: Physician Assistant ?Contact information: ?[address] ?[place] [address] ?[phone] ? ? ?  ?  ? ?  ?  ? ?  ? ?No Known Allergies ? ?You were cared for by a hospitalist during your hospital stay. If you have any questions about your discharge medications or the care you received while you were in the hospital after you are discharged, you can call the unit and asked to speak with the hospitalist on call if the hospitalist that took care of you is not available. Once you are discharged, your primary care physician will handle any further medical issues. Please note that no refills for any discharge medications will be authorized once you are discharged, as it is imperative that you return to your primary care physician (or establish a relationship with a primary care physician if you do not have one) for your aftercare needs so that they can reassess your need for medications and monitor your lab values. ? ? ?Procedures/Studies: ?DG Chest 1 View ? ?Result Date: 04/01/2022 ?CLINICAL DATA:  Shortness of breath.  Weakness. EXAM: CHEST  1 VIEW COMPARISON:  03/17/2022 FINDINGS: The right lung is clear without focal pneumonia, edema, pneumothorax or pleural effusion. Atelectasis or pneumonia is identified in the peripheral left base. The cardiopericardial silhouette is within normal limits for size. Bones are diffusely demineralized. IMPRESSION: Small focus of airspace disease at the left base, suspicious for pneumonia although atelectasis could have this appearance. Electronically Signed   By: Misty Stanley M.D.   On: 04/01/2022 15:29  ? ?CT HEAD WO CONTRAST (5MM) ? ?Result Date: 04/01/2022 ?CLINICAL DATA:  Shortness of breath. History of meningioma resection. EXAM: CT HEAD WITHOUT CONTRAST  TECHNIQUE: Contiguous axial images were obtained from the base of the skull through the vertex without intravenous contrast. RADIATION DOSE REDUCTION: This exam was performed according to the departmental dose-optimization program which includes automated exposure control, adjustment of the mA and/or kV according to patient size and/or use of iterative reconstruction technique. COMPARISON:  03/17/2022 FINDINGS: Brain: Stable surgical changes involving the left frontal parietal region with encephalomalacia. Better seen on the MRI is a small residual or recurrent meningioma along the lower posterior aspect of the resection bed. Stable age related cerebral atrophy, ventriculomegaly and periventricular white matter disease. No acute intracranial findings such as hemispheric infarction or intracranial hemorrhage. No extra-axial fluid collections are identified. The brainstem and cerebellum are grossly normal in stable. Vascular: Stable vascular calcifications. No aneurysm or hyperdense vessels. Skull: Stable large craniectomy defect in the left high frontal parietal area. No acute bony findings. Sinuses/Orbits: The paranasal sinuses and mastoid air cells are clear. The globes are intact. Other: No scalp lesions or scalp hematoma. IMPRESSION: 1. Stable surgical changes involving the left frontoparietal region with encephalomalacia. 2. Small residual or recurrent meningioma along the lower  posterior aspect of the resection bed. This is better demonstrated on the recent MRI. 3. Stable age related cerebral atrophy, ventriculomegaly and periventricular white matter disease. Electronically Signed   By: Marijo Sanes M.D.   On: 04/01/2022 16:29  ? ?CT Head Wo Contrast ? ?Result Date: 03/17/2022 ?CLINICAL DATA:  Poly trauma, blunt. Additional history provided: Fall EXAM: CT HEAD WITHOUT CONTRAST TECHNIQUE: Contiguous axial images were obtained from the base of the skull through the vertex without intravenous contrast. RADIATION  DOSE REDUCTION: This exam was performed according to the departmental dose-optimization program which includes automated exposure control, adjustment of the mA and/or kV according to patient size and/or use o

## 2022-04-02 NOTE — Assessment & Plan Note (Addendum)
Resolved

## 2022-04-02 NOTE — Assessment & Plan Note (Signed)
- 

## 2022-04-02 NOTE — TOC Transition Note (Signed)
Transition of Care (TOC) - CM/SW Discharge Note ? ? ?Patient Details  ?Name: James Olsen ?MRN: [id number] ?Date of Birth: [birth year] ? ?Transition of Care (TOC) CM/SW Contact:  ?Jenica Costilow, Marjie Skiff, RN ?Phone Number: ?04/02/2022, 3:54 PM ? ? ?Clinical Narrative:    ? ?Pt from home with wife. He was [facility] with [facility] for HHPT/OT/Speech and an Aide prior to admission. [facility] will continue services at dc.  ? ?  ?  ? ? ?Readmission Risk Interventions ?   ? View : No data to display.  ?  ?  ?  ? ? ? ? ? ?

## 2022-04-02 NOTE — Assessment & Plan Note (Signed)
Supportive care, PPI as needed ?

## 2022-04-02 NOTE — Progress Notes (Signed)
Initial Nutrition Assessment ? ?DOCUMENTATION CODES:  ? ?Not applicable ? ?INTERVENTION:  ?- Encourage PO intake ? ?- Ensure Enlive po BID, each supplement provides 350 kcal and 20 grams of protein. ? ?- MVI with minerals daily ? ?NUTRITION DIAGNOSIS:  ? ?Increased nutrient needs related to acute illness as evidenced by estimated needs. ? ?GOAL:  ? ?Patient will meet greater than or equal to 90% of their needs ? ? ?MONITOR:  ? ?PO intake, Supplement acceptance, Labs, Weight trends ? ?REASON FOR ASSESSMENT:  ? ?Consult ?Assessment of nutrition requirement/status ? ?ASSESSMENT:  ? ?Pt admitted from Oncology office d/t SOB and fatigue secondary to possible bronchitis versus PNA. PMH significant for atypical meningioma s/p craniotomy and resection with complication of CVA with residual R hemiparesis and h/o seizures and GERD. ? ?Pt with wife at bedside. He endorses a decrease in appetite within the last week d/t lack of energy and mobility. He states that since starting treatments, he has had tastes changes of which he tries to manage with addition of spices but this has not helped. Despite decrease in appetite he typically eats 3 meals per day consisting of oatmeal with fruit or cereal with milk and fruit, wheat crackers, [place] roll ups and avocado. His wife recently bought Pedialyte for hydration and protein shakes. Pt states that he takes Magnesium supplement at home for muscle cramps.  ? ?1 documented meal completion of 100% (793 kcal, 26g pro) ?Pt received lunch tray during visit which included of a sandwich and pudding.  ? ?He reports that his mobility has decreased significantly since having a recent fall. Prior to this he was using a rollator. He states that he had a stroke in 2019 which affected his R leg of which he reports 85-90% improvement at that time with PT. Since fall has had significant decrease in functionality of R leg. ? ?Pt did not provide usual weight but suspects a small degree of weight loss.  Obtained updated bed weight which was 179 lbs however documented weight from this morning was 193 lbs. Last known weight to pt was 181 lbs. Per documented weights, appears he has had very minimal weight loss (2%) from December to March.  ? ?Medications: decadron, magonate  ? ?Labs: Vitamin D 15.88 ? ?NUTRITION - FOCUSED PHYSICAL EXAM: ? ?Flowsheet Row Most Recent Value  ?Orbital Region No depletion  ?Upper Arm Region No depletion  ?Thoracic and Lumbar Region No depletion  ?Buccal Region No depletion  ?Temple Region No depletion  ?Clavicle Bone Region No depletion  ?Clavicle and Acromion Bone Region No depletion  ?Scapular Bone Region No depletion  ?Dorsal Hand Mild depletion  ?Patellar Region Severe depletion  [R>L d/t h/o stroke]  ?Anterior Thigh Region Severe depletion  ?Posterior Calf Region Severe depletion  ?Edema (RD Assessment) None  ?Hair Reviewed  ?Eyes Reviewed  ?Mouth Reviewed  ?Skin Reviewed  ?Nails Reviewed  ? ?  ? ? ?Diet Order:   ?Diet Order   ? ?       ?  Diet regular Room service appropriate? Yes; Fluid consistency: Thin  Diet effective now       ?  ? ?  ?  ? ?  ? ? ?EDUCATION NEEDS:  ? ?Education needs have been addressed ? ?Skin:  Skin Assessment: Reviewed RN Assessment ? ?Last BM:  4/11 ? ?Height:  ? ?Ht Readings from Last 1 Encounters:  ?04/01/22 '5\' 9"'$  (1.753 m)  ? ? ?Weight:  ? ?Wt Readings from Last 1 Encounters:  ?  04/02/22 87.9 kg  ? ?BMI:  Body mass index is 28.62 kg/m?. ? ?Estimated Nutritional Needs:  ? ?Kcal:  2100-2300 ? ?Protein:  105-120g ? ?Fluid:  >/=2.1L ? ?Clayborne Dana, RDN, LDN ?Clinical Nutrition ?

## 2022-04-03 ENCOUNTER — Encounter: Payer: Self-pay | Admitting: Internal Medicine

## 2022-04-03 ENCOUNTER — Telehealth: Payer: Self-pay | Admitting: Physician Assistant

## 2022-04-03 NOTE — Telephone Encounter (Signed)
Lvm for the pt to schedule in office visit. Pt will need lab work. ?

## 2022-04-03 NOTE — Telephone Encounter (Signed)
[employer] called about the request for labs.  ? ?Stated that "he was just bled dry" in the ED and requesting if labs can be skipped. ? ?Instead would like to meet via telephone. ? ?If telephone is not an option she mentioned transportation may need to be arranged. ? ?Please call back to discuss options where needed. ? ?[phone] ?

## 2022-04-03 NOTE — Telephone Encounter (Signed)
Pt's spouse states pt was recently in ED and called to schedule a follow up. She is asking if it can be a virtual visit due to transportation issues. Please advise. ?

## 2022-04-04 ENCOUNTER — Telehealth: Payer: Self-pay

## 2022-04-04 NOTE — Telephone Encounter (Signed)
Pt's spouse states pt is scheduled to have lab work on 4/20 per Dr Mickeal Skinner. Spouse states he can't not have any more lab work and this lab work scheduled should be visible to our office once it's completed. She also stated if he is needing an appt, he cannot leave his bed and would need transportation help to get here. Please advise ?

## 2022-04-04 NOTE — Telephone Encounter (Signed)
Transition Care Management Follow-up Telephone Call ?Date of discharge and from where: [facility] 04/02/22 ?How have you been since you were released from the hospital? Better one day at a time  ?Any questions or concerns? Yes ?Lab draws and transportation concern ?Items Reviewed: ?Did the pt receive and understand the discharge instructions provided? Yes  ?Medications obtained and verified? Yes  ?Other? No  ?Any new allergies since your discharge? No  ?Dietary orders reviewed? Yes ?Do you have support at home? Yes  ? ?Home Care and Equipment/Supplies: ?Were home health services ordered? yes ?If so, what is the name of the agency? [facility]  ?Has the agency set up a time to come to the patient's home? yes ?Were any new equipment or medical supplies ordered?  No pt has a rollator ?What is the name of the medical supply agency?  ?Were you able to get the supplies/equipment?  ?Do you have any questions related to the use of the equipment or supplies? No ? ?Functional Questionnaire: (I = Independent and D = Dependent) ?ADLs: I ? ?Bathing/Dressing- I ? ?Meal Prep- I ? ?Eating- I ? ?Maintaining continence- I ? ?Transferring/Ambulation- I with assist with rollator pt wife stated he can move about 25 steps at this time  ? ?Managing Meds- I ? ?Follow up appointments reviewed: ? ?PCP Hospital f/u appt confirmed? No   ?Specialist Hospital f/u appt confirmed? Yes  Scheduled to see Lab/Dr Mickeal Skinner on 04/10/22 @ 9:00. ?Are transportation arrangements needed? Yes  ?If their condition worsens, is the pt aware to call PCP or go to the Emergency Dept.? Yes ?Was the patient provided with contact information for the PCP's office or ED? Yes ?Was to pt encouraged to call back with questions or concerns? Yes ? ?

## 2022-04-06 LAB — CULTURE, BLOOD (ROUTINE X 2): Culture: NO GROWTH

## 2022-04-07 ENCOUNTER — Telehealth: Payer: Self-pay | Admitting: Physician Assistant

## 2022-04-07 LAB — CULTURE, BLOOD (ROUTINE X 2)
Culture: NO GROWTH
Special Requests: ADEQUATE

## 2022-04-07 NOTE — Telephone Encounter (Signed)
..  Home Health Verbal Orders ? ?Agency:   ?[facility] ? ?Caller: ?Sonia Baller ?[phone] ? ?Contact and title ? ?Requesting OT/ PT/ Skilled nursing/ Social Work/ Speech:   ?speech ? ?Reason for Request:   ?Evaluated for cognitive incommunicative ? ?Frequency:   ?1/wk x 6 ? ?HH needs F2F w/in last 30 days   ?

## 2022-04-08 NOTE — Telephone Encounter (Signed)
I lvm with Sonia Baller from [facility] to give verbal orders below. ?

## 2022-04-10 ENCOUNTER — Other Ambulatory Visit: Payer: Self-pay

## 2022-04-10 ENCOUNTER — Inpatient Hospital Stay: Payer: Medicare Other

## 2022-04-10 ENCOUNTER — Other Ambulatory Visit: Payer: Self-pay | Admitting: *Deleted

## 2022-04-10 ENCOUNTER — Inpatient Hospital Stay (HOSPITAL_BASED_OUTPATIENT_CLINIC_OR_DEPARTMENT_OTHER): Payer: Medicare Other | Admitting: Internal Medicine

## 2022-04-10 ENCOUNTER — Other Ambulatory Visit: Payer: Medicare Other

## 2022-04-10 ENCOUNTER — Ambulatory Visit: Payer: Medicare Other | Admitting: Internal Medicine

## 2022-04-10 ENCOUNTER — Other Ambulatory Visit: Payer: Self-pay | Admitting: Internal Medicine

## 2022-04-10 ENCOUNTER — Ambulatory Visit: Payer: Medicare Other

## 2022-04-10 VITALS — BP 98/73 | HR 80 | Temp 97.6°F | Resp 16

## 2022-04-10 DIAGNOSIS — D420 Neoplasm of uncertain behavior of cerebral meninges: Secondary | ICD-10-CM

## 2022-04-10 DIAGNOSIS — R569 Unspecified convulsions: Secondary | ICD-10-CM | POA: Diagnosis not present

## 2022-04-10 DIAGNOSIS — C713 Malignant neoplasm of parietal lobe: Secondary | ICD-10-CM | POA: Insufficient documentation

## 2022-04-10 DIAGNOSIS — Y842 Radiological procedure and radiotherapy as the cause of abnormal reaction of the patient, or of later complication, without mention of misadventure at the time of the procedure: Secondary | ICD-10-CM

## 2022-04-10 DIAGNOSIS — I6789 Other cerebrovascular disease: Secondary | ICD-10-CM | POA: Diagnosis not present

## 2022-04-10 DIAGNOSIS — Z5112 Encounter for antineoplastic immunotherapy: Secondary | ICD-10-CM | POA: Insufficient documentation

## 2022-04-10 LAB — CBC WITH DIFFERENTIAL (CANCER CENTER ONLY)
Abs Immature Granulocytes: 0.82 10*3/uL — ABNORMAL HIGH (ref 0.00–0.07)
Basophils Absolute: 0 10*3/uL (ref 0.0–0.1)
Basophils Relative: 0 %
Eosinophils Absolute: 0 10*3/uL (ref 0.0–0.5)
Eosinophils Relative: 0 %
HCT: 43.9 % (ref 39.0–52.0)
Hemoglobin: 14.8 g/dL (ref 13.0–17.0)
Immature Granulocytes: 6 %
Lymphocytes Relative: 10 %
Lymphs Abs: 1.5 10*3/uL (ref 0.7–4.0)
MCH: 31.5 pg (ref 26.0–34.0)
MCHC: 33.7 g/dL (ref 30.0–36.0)
MCV: 93.4 fL (ref 80.0–100.0)
Monocytes Absolute: 0.7 10*3/uL (ref 0.1–1.0)
Monocytes Relative: 5 %
Neutro Abs: 11.7 10*3/uL — ABNORMAL HIGH (ref 1.7–7.7)
Neutrophils Relative %: 79 %
Platelet Count: 284 10*3/uL (ref 150–400)
RBC: 4.7 MIL/uL (ref 4.22–5.81)
RDW: 15.9 % — ABNORMAL HIGH (ref 11.5–15.5)
WBC Count: 14.7 10*3/uL — ABNORMAL HIGH (ref 4.0–10.5)
nRBC: 0.1 % (ref 0.0–0.2)

## 2022-04-10 LAB — TOTAL PROTEIN, URINE DIPSTICK: Protein, ur: NEGATIVE mg/dL

## 2022-04-10 MED ORDER — DEXAMETHASONE 2 MG PO TABS: 2.0000 mg | ORAL_TABLET | Freq: Every day | ORAL | 2 refills | Status: DC

## 2022-04-10 MED ORDER — SODIUM CHLORIDE 0.9 % IV SOLN
10.0000 mg/kg | Freq: Once | INTRAVENOUS | Status: AC
  Administered 2022-04-10: 800 mg via INTRAVENOUS
  Filled 2022-04-10: qty 32

## 2022-04-10 MED ORDER — SODIUM CHLORIDE 0.9 % IV SOLN: Freq: Once | INTRAVENOUS | Status: AC

## 2022-04-10 NOTE — Progress Notes (Signed)
? ?[facility] [address]  ?[place],  [address] ?[phone] ? ? ?Interval Evaluation ? ?Date of Service: 04/10/22 ?Patient Name: James Olsen ?Patient MRN: [id number] ?Patient DOB: Feb 06, 1946 ?Provider: [name], MD ? ?Identifying Statement:  ?James Olsen is a 76 y.o. 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 ?Oncologic History: ?08/18/18: Craniotomy, resection ([place]) ?01/24/19: Completes IMRT 55.8 Gy/30 fx to residual tumor Isidore Moos) ?06/21/22: Repeat craniotomy ([place]) ?12/26/21: Progression of tumor, fractionated SRS Isidore Moos) ?03/11/22: Initiates avastin '10mg'$ /kg IV q3 weeks for refractory CNS inflammation  ? ?Interval History: ? James Olsen presents today for second avastin infusion.  Lethargy, dyspnea, weakness is clearly improved since hospitalization and treatment for pneumonia.  Decadron is currently at '4mg'$  daily. Continues on Keppra '1000mg'$  twice per day, no recurrence of seizures.  Working with PT and home health.    ? ?H+P (01/20/19) Patient presented to medical attention in the winter of 2019 after noticing a "bulging" coming from the top of his head.  He also had experienced some numbness of his left leg at the time.  He obtained an MRI which demonstrated a dural based mass extending through the skull.  After a period of monitoring he underwent resection on 10/29/18 with Dr. Christella Noa.  Post-operatively he was very weak on the right side and was found to have a stroke.  After extensive rehabilitation he regained most of his right sided function.  Because of residual tumor and histology (WHO II) radiation was started on 12/19 with Dr. Isidore Moos.  Next week is his final session which he has tolerated without ill effect. ? ?Medications: ?Current Outpatient Medications on File Prior to Visit  ?Medication Sig Dispense Refill  ? baclofen (LIORESAL) 10 MG tablet Take 1 tablet twice daily and 1 to 2 tablets at bedtime 360 tablet 2  ?  dexamethasone (DECADRON) 2 MG tablet Take 2 tablets (4 mg total) by mouth daily. 90 tablet 2  ? levETIRAcetam (KEPPRA) 500 MG tablet Take 2 tablets (1,000 mg total) by mouth 2 (two) times daily. 60 tablet 3  ? magnesium gluconate (MAGONATE) 500 MG tablet Take 500 mg by mouth at bedtime.    ? Multiple Vitamins-Minerals (PRESERVISION AREDS 2) CAPS Take 1 capsule by mouth 2 (two) times daily.     ? silodosin (RAPAFLO) 8 MG CAPS capsule Take 8 mg by mouth daily with breakfast.    ? ?No current facility-administered medications on file prior to visit.  ? ? ?Allergies: No Known Allergies ?Past Medical History:  ?Past Medical History:  ?Diagnosis Date  ? Allergy   ? Arthritis   ? Cataract   ? GERD (gastroesophageal reflux disease)   ? Glaucoma   ? pt denies.   ? History of blood transfusion   ?  5 units after previous surgery  ? History of radiation therapy 12/09/18- 01/24/2019  ? Left sagittal brain, 11.8 Gy in 31 fractions for a total dose of 55.8 Gy  ? Hyperlipidemia   ? Macular degeneration   ? Seizures ([place])   ? Stroke [facility])   ? after surgery in August 2019 mostly recovered still some weakness in left arm  ? Wears glasses   ? ?Past Surgical History:  ?Past Surgical History:  ?Procedure Laterality Date  ? APPLICATION OF CRANIAL NAVIGATION N/A 06/21/2021  ? Procedure: APPLICATION OF CRANIAL NAVIGATION;  Surgeon: [name], MD;  Location: [place];  Service: Neurosurgery;  Laterality: N/A;  ? BRAIN SURGERY    ?  CHOLECYSTECTOMY  2008  ? lap choli  ? COLONOSCOPY    ? CRANIOPLASTY N/A 10/29/2018  ? Procedure: CRANIOPLASTY;  Surgeon: [name], MD;  Location: [place];  Service: Neurosurgery;  Laterality: N/A;  CRANIOPLASTY  ? CRANIOTOMY N/A 08/18/2018  ? Procedure: CRANIOTOMY TUMOR EXCISION;  Surgeon: [name], MD;  Location: [place];  Service: Neurosurgery;  Laterality: N/A;  CRANIOTOMY TUMOR EXCISION  ? CRANIOTOMY Bilateral 06/21/2021  ? Procedure: Bilateral Craniotomy for tumor resection;  Surgeon: [name], MD;   Location: [place];  Service: Neurosurgery;  Laterality: Bilateral;  ? DIAGNOSTIC LAPAROSCOPY    ? EYE SURGERY    ? laser surgery to relieve pressure  ? NAILBED REPAIR Left 05/04/2014  ? Procedure: LEFT INDEX NAIL ABLATION V-Y FLAP COVERAGE;  Surgeon: [name]., MD;  Location: [place];  Service: Orthopedics;  Laterality: Left;  index  ? PILONIDAL CYST EXCISION    ? x2  ? TONSILLECTOMY    ? VASECTOMY    ? ?Social History:  ?Social History  ? ?Socioeconomic History  ? Marital status: Married  ?  Spouse name: Not on file  ? Number of children: Not on file  ? Years of education: Not on file  ? Highest education level: Not on file  ?Occupational History  ? Not on file  ?Tobacco Use  ? Smoking status: Never  ? Smokeless tobacco: Never  ?Vaping Use  ? Vaping Use: Never used  ?Substance and Sexual Activity  ? Alcohol use: Yes  ?  Comment: occasionally  ? Drug use: No  ? Sexual activity: Not Currently  ?Other Topics Concern  ? Not on file  ?Social History Narrative  ? Not on file  ? ?Social Determinants of Health  ? ?Financial Resource Strain: Not on file  ?Food Insecurity: Not on file  ?Transportation Needs: Not on file  ?Physical Activity: Not on file  ?Stress: Not on file  ?Social Connections: Not on file  ?Intimate Partner Violence: Not on file  ? ?Family History:  ?Family History  ?Problem Relation Age of Onset  ? Diabetes Mother   ? Skin cancer Brother   ? Congenital heart disease Brother   ? ? ?Review of Systems: ?Constitutional: Denies fevers, chills or abnormal weight loss ?Eyes: Denies blurriness of vision ?Ears, nose, mouth, throat, and face: Denies mucositis or sore throat ?Respiratory: Denies cough, dyspnea or wheezes ?Cardiovascular: Denies palpitation, chest discomfort or lower extremity swelling ?Gastrointestinal:  Denies nausea, constipation, diarrhea ?GU: Denies dysuria or incontinence ?Skin: Denies abnormal skin rashes ?Neurological: Per HPI ?Musculoskeletal: Denies joint pain,  back or neck discomfort. No decrease in ROM ?Behavioral/Psych: Denies anxiety, disturbance in thought content, and mood instability ? ?Physical Exam: ?Vitals:  ? 04/10/22 1349  ?BP: 98/73  ?Pulse: 80  ?Resp: 16  ?Temp: 97.6 ?F (36.4 ?C)  ?SpO2: 99%  ? ?KPS: 60. ?General: Alert, cooperative, pleasant, in no acute distress ?Head: Craniotomy scar noted, dry and intact. ?EENT: No conjunctival injection or scleral icterus. Oral mucosa moist ?Lungs: Resp effort increased ?Cardiac: Regular rate and rhythm ?Abdomen: Soft, non-distended abdomen ?Skin: No rashes cyanosis or petechiae. ?Extremities: ++edema ? ?Neurologic Exam: ?Mental Status: Awake, alert, attentive to examiner. Oriented to self and environment. Language is fluent with intact comprehension.  ?Cranial Nerves: Visual acuity is grossly normal. Visual fields are full. Extra-ocular movements intact. No ptosis. Face is symmetric, tongue midline. ?Motor: Tone and bulk are normal. Power is 3/5 in right arm and leg. Reflexes are symmetric, no  pathologic reflexes present. Intact finger to nose bilaterally ?Sensory: Intact to light touch and temperature ?Gait: Hemiparetic, walker assisted ? ?Labs: ?I have reviewed the data as listed ?   ?Component Value Date/Time  ? NA 142 04/02/2022 0532  ? K 3.8 04/02/2022 0532  ? CL 113 (H) 04/02/2022 0532  ? CO2 25 04/02/2022 0532  ? GLUCOSE 126 (H) 04/02/2022 0532  ? BUN 21 04/02/2022 0532  ? CREATININE 0.72 04/02/2022 0532  ? CREATININE 0.84 12/03/2021 1228  ? CALCIUM 8.1 (L) 04/02/2022 0532  ? PROT 4.9 (L) 04/02/2022 0532  ? ALBUMIN 2.3 (L) 04/02/2022 0532  ? AST 20 04/02/2022 0532  ? ALT 44 04/02/2022 0532  ? ALKPHOS 66 04/02/2022 0532  ? BILITOT 0.6 04/02/2022 0532  ? GFRNONAA >60 04/02/2022 0532  ? GFRNONAA >60 12/03/2021 1228  ? GFRAA >60 11/26/2018 1231  ? ?Lab Results  ?Component Value Date  ? WBC 14.7 (H) 04/10/2022  ? NEUTROABS PENDING 04/10/2022  ? HGB 14.8 04/10/2022  ? HCT 43.9 04/10/2022  ? MCV 93.4 04/10/2022  ? PLT  284 04/10/2022  ? ? ?Imaging: ? ?DG Chest 1 View ? ?Result Date: 04/01/2022 ?CLINICAL DATA:  Shortness of breath.  Weakness. EXAM: CHEST  1 VIEW COMPARISON:  03/17/2022 FINDINGS: The right lung is clear withou

## 2022-04-10 NOTE — Patient Instructions (Signed)
[facility]  Discharge Instructions: ?Thank you for choosing [facility] to provide your oncology and hematology care.  ? ?If you have a lab appointment with the [facility], please go directly to the [facility] and check in at the registration area. ?  ?Wear comfortable clothing and clothing appropriate for easy access to any Portacath or PICC line.  ? ?We strive to give you quality time with your provider. You may need to reschedule your appointment if you arrive late (15 or more minutes).  Arriving late affects you and other patients whose appointments are after yours.  Also, if you miss three or more appointments without notifying the office, you may be dismissed from the clinic at the provider?s discretion.    ?  ?For prescription refill requests, have your pharmacy contact our office and allow 72 hours for refills to be completed.   ? ?Today you received the following chemotherapy and/or immunotherapy agents: Bevacizumab.     ?  ?To help prevent nausea and vomiting after your treatment, we encourage you to take your nausea medication as directed. ? ?BELOW ARE SYMPTOMS THAT SHOULD BE REPORTED IMMEDIATELY: ?*FEVER GREATER THAN 100.4 F (38 ?C) OR HIGHER ?*CHILLS OR SWEATING ?*NAUSEA AND VOMITING THAT IS NOT CONTROLLED WITH YOUR NAUSEA MEDICATION ?*UNUSUAL SHORTNESS OF BREATH ?*UNUSUAL BRUISING OR BLEEDING ?*URINARY PROBLEMS (pain or burning when urinating, or frequent urination) ?*BOWEL PROBLEMS (unusual diarrhea, constipation, pain near the anus) ?TENDERNESS IN MOUTH AND THROAT WITH OR WITHOUT PRESENCE OF ULCERS (sore throat, sores in mouth, or a toothache) ?UNUSUAL RASH, SWELLING OR PAIN  ?UNUSUAL VAGINAL DISCHARGE OR ITCHING  ? ?Items with * indicate a potential emergency and should be followed up as soon as possible or go to the Emergency Department if any problems should occur. ? ?Please show the CHEMOTHERAPY ALERT CARD or IMMUNOTHERAPY ALERT CARD at check-in  to the Emergency Department and triage nurse. ? ?Should you have questions after your visit or need to cancel or reschedule your appointment, please contact [facility]  Dept: [phone]  and follow the prompts.  Office hours are 8:00 a.m. to 4:30 p.m. Monday - Friday. Please note that voicemails left after 4:00 p.m. may not be returned until the following business day.  We are closed weekends and major holidays. You have access to a nurse at all times for urgent questions. Please call the main number to the clinic Dept: [phone] and follow the prompts. ? ? ?For any non-urgent questions, you may also contact your provider using MyChart. We now offer e-Visits for anyone 75 and older to request care online for non-urgent symptoms. For details visit [email or web address]. ?  ?Also download the MyChart app! Go to the app store, search "MyChart", open the app, select [facility], and log in with your MyChart username and password. ? ?Due to Covid, a mask is required upon entering the hospital/clinic. If you do not have a mask, one will be given to you upon arrival. For doctor visits, patients may have 1 support person aged 78 or older with them. For treatment visits, patients cannot have anyone with them due to current Covid guidelines and our immunocompromised population.  ? ?Bevacizumab injection ?What is this medication? ?BEVACIZUMAB (be va SIZ yoo mab) is a monoclonal antibody. It is used to treat many types of cancer. ?This medicine may be used for other purposes; ask your health care provider or pharmacist if you have questions. ?COMMON BRAND NAME(S): Alymsys,  Avastin, MVASI, Zirabev ?What should I tell my care team before I take this medication? ?They need to know if you have any of these conditions: ?diabetes ?heart disease ?high blood pressure ?history of coughing up blood ?prior anthracycline chemotherapy (e.g., doxorubicin, daunorubicin, epirubicin) ?recent or ongoing  radiation therapy ?recent or planning to have surgery ?stroke ?an unusual or allergic reaction to bevacizumab, hamster proteins, mouse proteins, other medicines, foods, dyes, or preservatives ?pregnant or trying to get pregnant ?breast-feeding ?How should I use this medication? ?This medicine is for infusion into a vein. It is given by a health care professional in a hospital or clinic setting. ?Talk to your pediatrician regarding the use of this medicine in children. Special care may be needed. ?Overdosage: If you think you have taken too much of this medicine contact a poison control center or emergency room at once. ?NOTE: This medicine is only for you. Do not share this medicine with others. ?What if I miss a dose? ?It is important not to miss your dose. Call your doctor or health care professional if you are unable to keep an appointment. ?What may interact with this medication? ?Interactions are not expected. ?This list may not describe all possible interactions. Give your health care provider a list of all the medicines, herbs, non-prescription drugs, or dietary supplements you use. Also tell them if you smoke, drink alcohol, or use illegal drugs. Some items may interact with your medicine. ?What should I watch for while using this medication? ?Your condition will be monitored carefully while you are receiving this medicine. You will need important blood work and urine testing done while you are taking this medicine. ?This medicine may increase your risk to bruise or bleed. Call your doctor or health care professional if you notice any unusual bleeding. ?Before having surgery, talk to your health care provider to make sure it is ok. This drug can increase the risk of poor healing of your surgical site or wound. You will need to stop this drug for 28 days before surgery. After surgery, wait at least 28 days before restarting this drug. Make sure the surgical site or wound is healed enough before restarting  this drug. Talk to your health care provider if questions. ?Do not become pregnant while taking this medicine or for 6 months after stopping it. Women should inform their doctor if they wish to become pregnant or think they might be pregnant. There is a potential for serious side effects to an unborn child. Talk to your health care professional or pharmacist for more information. Do not breast-feed an infant while taking this medicine and for 6 months after the last dose. ?This medicine has caused ovarian failure in some women. This medicine may interfere with the ability to have a child. You should talk to your doctor or health care professional if you are concerned about your fertility. ?What side effects may I notice from receiving this medication? ?Side effects that you should report to your doctor or health care professional as soon as possible: ?allergic reactions like skin rash, itching or hives, swelling of the face, lips, or tongue ?chest pain or chest tightness ?chills ?coughing up blood ?high fever ?seizures ?severe constipation ?signs and symptoms of bleeding such as bloody or black, tarry stools; red or dark-brown urine; spitting up blood or brown material that looks like coffee grounds; red spots on the skin; unusual bruising or bleeding from the eye, gums, or nose ?signs and symptoms of a blood clot such as breathing  problems; chest pain; severe, sudden headache; pain, swelling, warmth in the leg ?signs and symptoms of a stroke like changes in vision; confusion; trouble speaking or understanding; severe headaches; sudden numbness or weakness of the face, arm or leg; trouble walking; dizziness; loss of balance or coordination ?stomach pain ?sweating ?swelling of legs or ankles ?vomiting ?weight gain ?Side effects that usually do not require medical attention (report to your doctor or health care professional if they continue or are bothersome): ?back pain ?changes in taste ?decreased appetite ?dry  skin ?nausea ?tiredness ?This list may not describe all possible side effects. Call your doctor for medical advice about side effects. You may report side effects to FDA at 1-800-FDA-1088. ?Where should I keep

## 2022-04-11 ENCOUNTER — Other Ambulatory Visit: Payer: Self-pay | Admitting: Internal Medicine

## 2022-04-14 ENCOUNTER — Encounter: Payer: Self-pay | Admitting: Internal Medicine

## 2022-04-16 ENCOUNTER — Ambulatory Visit: Payer: Medicare Other | Admitting: Physical Medicine & Rehabilitation

## 2022-04-18 ENCOUNTER — Telehealth: Payer: Self-pay | Admitting: Physician Assistant

## 2022-04-18 NOTE — Telephone Encounter (Signed)
..  Home Health Certification or Plan of Care Tracking ? ?Is this a Certification or Plan of Care? yes ? ?[facility] Agency: [facility] ? ?Order Number:  [id number] ? ?Has charge sheet been attached? yes ? ?Where has form been placed:  In provider's box ? ?Faxed to:   [phone] ?

## 2022-04-21 ENCOUNTER — Inpatient Hospital Stay: Payer: Medicare Other | Admitting: Internal Medicine

## 2022-04-21 ENCOUNTER — Ambulatory Visit: Payer: Medicare Other

## 2022-04-21 ENCOUNTER — Inpatient Hospital Stay: Payer: Medicare Other

## 2022-04-21 ENCOUNTER — Encounter: Payer: Self-pay | Admitting: Internal Medicine

## 2022-04-21 NOTE — Telephone Encounter (Signed)
Form faxed

## 2022-04-22 ENCOUNTER — Other Ambulatory Visit: Payer: Medicare Other

## 2022-04-22 ENCOUNTER — Ambulatory Visit: Payer: Medicare Other

## 2022-04-22 ENCOUNTER — Ambulatory Visit: Payer: Medicare Other | Admitting: Internal Medicine

## 2022-04-23 DIAGNOSIS — D696 Thrombocytopenia, unspecified: Secondary | ICD-10-CM

## 2022-04-23 DIAGNOSIS — I69351 Hemiplegia and hemiparesis following cerebral infarction affecting right dominant side: Secondary | ICD-10-CM | POA: Diagnosis not present

## 2022-04-23 DIAGNOSIS — R339 Retention of urine, unspecified: Secondary | ICD-10-CM

## 2022-04-23 DIAGNOSIS — D420 Neoplasm of uncertain behavior of cerebral meninges: Secondary | ICD-10-CM | POA: Diagnosis not present

## 2022-04-23 DIAGNOSIS — G40909 Epilepsy, unspecified, not intractable, without status epilepticus: Secondary | ICD-10-CM | POA: Diagnosis not present

## 2022-04-23 DIAGNOSIS — Z9181 History of falling: Secondary | ICD-10-CM

## 2022-04-23 DIAGNOSIS — M6281 Muscle weakness (generalized): Secondary | ICD-10-CM

## 2022-04-23 DIAGNOSIS — M159 Polyosteoarthritis, unspecified: Secondary | ICD-10-CM

## 2022-04-23 DIAGNOSIS — H3530 Unspecified macular degeneration: Secondary | ICD-10-CM

## 2022-04-23 DIAGNOSIS — K8020 Calculus of gallbladder without cholecystitis without obstruction: Secondary | ICD-10-CM

## 2022-04-23 DIAGNOSIS — K219 Gastro-esophageal reflux disease without esophagitis: Secondary | ICD-10-CM

## 2022-04-23 DIAGNOSIS — S300XXD Contusion of lower back and pelvis, subsequent encounter: Secondary | ICD-10-CM | POA: Diagnosis not present

## 2022-05-01 ENCOUNTER — Inpatient Hospital Stay: Payer: Medicare Other | Attending: Internal Medicine

## 2022-05-01 ENCOUNTER — Inpatient Hospital Stay: Payer: Medicare Other

## 2022-05-01 ENCOUNTER — Other Ambulatory Visit: Payer: Self-pay

## 2022-05-01 ENCOUNTER — Inpatient Hospital Stay (HOSPITAL_BASED_OUTPATIENT_CLINIC_OR_DEPARTMENT_OTHER): Payer: Medicare Other | Admitting: Internal Medicine

## 2022-05-01 VITALS — BP 127/72 | HR 90 | Temp 97.8°F | Resp 17 | Ht 69.0 in | Wt 175.6 lb

## 2022-05-01 DIAGNOSIS — Z5112 Encounter for antineoplastic immunotherapy: Secondary | ICD-10-CM | POA: Insufficient documentation

## 2022-05-01 DIAGNOSIS — D420 Neoplasm of uncertain behavior of cerebral meninges: Secondary | ICD-10-CM

## 2022-05-01 DIAGNOSIS — C713 Malignant neoplasm of parietal lobe: Secondary | ICD-10-CM | POA: Diagnosis present

## 2022-05-01 DIAGNOSIS — Y842 Radiological procedure and radiotherapy as the cause of abnormal reaction of the patient, or of later complication, without mention of misadventure at the time of the procedure: Secondary | ICD-10-CM

## 2022-05-01 DIAGNOSIS — R569 Unspecified convulsions: Secondary | ICD-10-CM | POA: Diagnosis not present

## 2022-05-01 DIAGNOSIS — Z7952 Long term (current) use of systemic steroids: Secondary | ICD-10-CM | POA: Diagnosis not present

## 2022-05-01 DIAGNOSIS — I6789 Other cerebrovascular disease: Secondary | ICD-10-CM | POA: Diagnosis not present

## 2022-05-01 LAB — CBC WITH DIFFERENTIAL (CANCER CENTER ONLY)
Abs Immature Granulocytes: 0.08 10*3/uL — ABNORMAL HIGH (ref 0.00–0.07)
Basophils Absolute: 0.1 10*3/uL (ref 0.0–0.1)
Basophils Relative: 1 %
Eosinophils Absolute: 0.1 10*3/uL (ref 0.0–0.5)
Eosinophils Relative: 1 %
HCT: 39.4 % (ref 39.0–52.0)
Hemoglobin: 13 g/dL (ref 13.0–17.0)
Immature Granulocytes: 1 %
Lymphocytes Relative: 15 %
Lymphs Abs: 1.5 10*3/uL (ref 0.7–4.0)
MCH: 31.8 pg (ref 26.0–34.0)
MCHC: 33 g/dL (ref 30.0–36.0)
MCV: 96.3 fL (ref 80.0–100.0)
Monocytes Absolute: 0.6 10*3/uL (ref 0.1–1.0)
Monocytes Relative: 6 %
Neutro Abs: 7.8 10*3/uL — ABNORMAL HIGH (ref 1.7–7.7)
Neutrophils Relative %: 76 %
Platelet Count: 134 10*3/uL — ABNORMAL LOW (ref 150–400)
RBC: 4.09 MIL/uL — ABNORMAL LOW (ref 4.22–5.81)
RDW: 15.8 % — ABNORMAL HIGH (ref 11.5–15.5)
WBC Count: 10.1 10*3/uL (ref 4.0–10.5)
nRBC: 0 % (ref 0.0–0.2)

## 2022-05-01 LAB — TOTAL PROTEIN, URINE DIPSTICK: Protein, ur: NEGATIVE mg/dL

## 2022-05-01 MED ORDER — SODIUM CHLORIDE 0.9 % IV SOLN
10.0000 mg/kg | Freq: Once | INTRAVENOUS | Status: AC
  Administered 2022-05-01: 800 mg via INTRAVENOUS
  Filled 2022-05-01: qty 32

## 2022-05-01 MED ORDER — SODIUM CHLORIDE 0.9 % IV SOLN: Freq: Once | INTRAVENOUS | Status: AC

## 2022-05-01 NOTE — Patient Instructions (Signed)
[facility]  Discharge Instructions: °Thank you for choosing [facility] to provide your oncology and hematology care.  ° °If you have a lab appointment with the [facility], please go directly to the [facility] and check in at the registration area. °  °Wear comfortable clothing and clothing appropriate for easy access to any Portacath or PICC line.  ° °We strive to give you quality time with your provider. You may need to reschedule your appointment if you arrive late (15 or more minutes).  Arriving late affects you and other patients whose appointments are after yours.  Also, if you miss three or more appointments without notifying the office, you may be dismissed from the clinic at the provider’s discretion.    °  °For prescription refill requests, have your pharmacy contact our office and allow 72 hours for refills to be completed.   ° °Today you received the following chemotherapy and/or immunotherapy agents: Bevacizumab.     °  °To help prevent nausea and vomiting after your treatment, we encourage you to take your nausea medication as directed. ° °BELOW ARE SYMPTOMS THAT SHOULD BE REPORTED IMMEDIATELY: °*FEVER GREATER THAN 100.4 F (38 °C) OR HIGHER °*CHILLS OR SWEATING °*NAUSEA AND VOMITING THAT IS NOT CONTROLLED WITH YOUR NAUSEA MEDICATION °*UNUSUAL SHORTNESS OF BREATH °*UNUSUAL BRUISING OR BLEEDING °*URINARY PROBLEMS (pain or burning when urinating, or frequent urination) °*BOWEL PROBLEMS (unusual diarrhea, constipation, pain near the anus) °TENDERNESS IN MOUTH AND THROAT WITH OR WITHOUT PRESENCE OF ULCERS (sore throat, sores in mouth, or a toothache) °UNUSUAL RASH, SWELLING OR PAIN  °UNUSUAL VAGINAL DISCHARGE OR ITCHING  ° °Items with * indicate a potential emergency and should be followed up as soon as possible or go to the Emergency Department if any problems should occur. ° °Please show the CHEMOTHERAPY ALERT CARD or IMMUNOTHERAPY ALERT CARD at check-in  to the Emergency Department and triage nurse. ° °Should you have questions after your visit or need to cancel or reschedule your appointment, please contact [facility]  Dept: [phone]  and follow the prompts.  Office hours are 8:00 a.m. to 4:30 p.m. Monday - Friday. Please note that voicemails left after 4:00 p.m. may not be returned until the following business day.  We are closed weekends and major holidays. You have access to a nurse at all times for urgent questions. Please call the main number to the clinic Dept: [phone] and follow the prompts. ° ° °For any non-urgent questions, you may also contact your provider using MyChart. We now offer e-Visits for anyone 18 and older to request care online for non-urgent symptoms. For details visit [email or web address]. °  °Also download the MyChart app! Go to the app store, search "MyChart", open the app, select [facility], and log in with your MyChart username and password. ° °Due to Covid, a mask is required upon entering the hospital/clinic. If you do not have a mask, one will be given to you upon arrival. For doctor visits, patients may have 1 support person aged 18 or older with them. For treatment visits, patients cannot have anyone with them due to current Covid guidelines and our immunocompromised population.  ° °

## 2022-05-01 NOTE — Progress Notes (Signed)
? ?[facility] [address]  ?[place], [place] [address] ?[phone]) [phone] ? ? ?Interval Evaluation ? ?Date of Service: 05/01/22 ?Patient Name: [facility] ?Patient MRN: [id number] ?Patient DOB: December 30, 1945 ?Provider: [name], MD ? ?Identifying Statement:  ?[facility] is a 76 y.o. 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 ?Oncologic History: ?08/18/18: Craniotomy, resection ([facility]) ?01/24/19: Completes IMRT 55.8 Gy/30 fx to residual tumor Isidore Moos) ?06/21/22: Repeat craniotomy ([facility]) ?12/26/21: Progression of tumor, fractionated SRS Isidore Moos) ?03/11/22: Initiates avastin '10mg'$ /kg IV q3 weeks for refractory CNS inflammation  ? ?Interval History: ? [facility] presents today for #3 avastin infusion.  He describes continued improvement with regards to his right sided weakness, lethargy, word finding difficulty.  Decadron is currently down to '1mg'$  daily without issue.  Continues on Keppra '1000mg'$  twice per day, no recurrence of seizures.  Still working with PT and home health.    ? ?H+P (01/20/19) Patient presented to medical attention in the winter of 2019 after noticing a "bulging" coming from the top of his head.  He also had experienced some numbness of his left leg at the time.  He obtained an MRI which demonstrated a dural based mass extending through the skull.  After a period of monitoring he underwent resection on 10/29/18 with Dr. Christella Noa.  Post-operatively he was very weak on the right side and was found to have a stroke.  After extensive rehabilitation he regained most of his right sided function.  Because of residual tumor and histology (WHO II) radiation was started on 12/19 with Dr. Isidore Moos.  Next week is his final session which he has tolerated without ill effect. ? ?Medications: ?Current Outpatient Medications on File Prior to Visit  ?Medication Sig Dispense Refill  ? baclofen (LIORESAL) 10 MG tablet Take 1 tablet twice daily and 1 to 2  tablets at bedtime 360 tablet 2  ? bethanechol (URECHOLINE) 25 MG tablet Take 25 mg by mouth 2 (two) times daily.    ? dexamethasone (DECADRON) 2 MG tablet TAKE 2 TABLETS (4 MG TOTAL) BY MOUTH DAILY AND 1 TABLET (2 MG TOTAL) AT BEDTIME. 90 tablet 2  ? levETIRAcetam (KEPPRA) 500 MG tablet Take 2 tablets (1,000 mg total) by mouth 2 (two) times daily. 60 tablet 3  ? magnesium gluconate (MAGONATE) 500 MG tablet Take 500 mg by mouth at bedtime.    ? Multiple Vitamins-Minerals (PRESERVISION AREDS 2) CAPS Take 1 capsule by mouth 2 (two) times daily.     ? silodosin (RAPAFLO) 8 MG CAPS capsule Take 8 mg by mouth daily with breakfast.    ? ?No current facility-administered medications on file prior to visit.  ? ? ?Allergies: No Known Allergies ?Past Medical History:  ?Past Medical History:  ?Diagnosis Date  ? Allergy   ? Arthritis   ? Cataract   ? GERD (gastroesophageal reflux disease)   ? Glaucoma   ? pt denies.   ? History of blood transfusion   ?  5 units after previous surgery  ? History of radiation therapy 12/09/18- 01/24/2019  ? Left sagittal brain, 11.8 Gy in 31 fractions for a total dose of 55.8 Gy  ? Hyperlipidemia   ? Macular degeneration   ? Seizures ([facility])   ? Stroke [facility])   ? after surgery in August 2019 mostly recovered still some weakness in left arm  ? Wears glasses   ? ?Past Surgical History:  ?Past Surgical History:  ?Procedure Laterality Date  ?  APPLICATION OF CRANIAL NAVIGATION N/A 06/21/2021  ? Procedure: APPLICATION OF CRANIAL NAVIGATION;  Surgeon: [name], MD;  Location: [facility];  Service: Neurosurgery;  Laterality: N/A;  ? BRAIN SURGERY    ? CHOLECYSTECTOMY  2008  ? lap choli  ? COLONOSCOPY    ? CRANIOPLASTY N/A 10/29/2018  ? Procedure: CRANIOPLASTY;  Surgeon: [name], MD;  Location: [facility];  Service: Neurosurgery;  Laterality: N/A;  CRANIOPLASTY  ? CRANIOTOMY N/A 08/18/2018  ? Procedure: CRANIOTOMY TUMOR EXCISION;  Surgeon: [name], MD;  Location: [facility];  Service: Neurosurgery;   Laterality: N/A;  CRANIOTOMY TUMOR EXCISION  ? CRANIOTOMY Bilateral 06/21/2021  ? Procedure: Bilateral Craniotomy for tumor resection;  Surgeon: [name], MD;  Location: [facility];  Service: Neurosurgery;  Laterality: Bilateral;  ? DIAGNOSTIC LAPAROSCOPY    ? EYE SURGERY    ? laser surgery to relieve pressure  ? NAILBED REPAIR Left 05/04/2014  ? Procedure: LEFT INDEX NAIL ABLATION V-Y FLAP COVERAGE;  Surgeon: [name]., MD;  Location: [facility];  Service: Orthopedics;  Laterality: Left;  index  ? PILONIDAL CYST EXCISION    ? x2  ? TONSILLECTOMY    ? VASECTOMY    ? ?Social History:  ?Social History  ? ?Socioeconomic History  ? Marital status: Married  ?  Spouse name: Not on file  ? Number of children: Not on file  ? Years of education: Not on file  ? Highest education level: Not on file  ?Occupational History  ? Not on file  ?Tobacco Use  ? Smoking status: Never  ? Smokeless tobacco: Never  ?Vaping Use  ? Vaping Use: Never used  ?Substance and Sexual Activity  ? Alcohol use: Yes  ?  Comment: occasionally  ? Drug use: No  ? Sexual activity: Not Currently  ?Other Topics Concern  ? Not on file  ?Social History Narrative  ? Not on file  ? ?Social Determinants of Health  ? ?Financial Resource Strain: Not on file  ?Food Insecurity: Not on file  ?Transportation Needs: Not on file  ?Physical Activity: Not on file  ?Stress: Not on file  ?Social Connections: Not on file  ?Intimate Partner Violence: Not on file  ? ?Family History:  ?Family History  ?Problem Relation Age of Onset  ? Diabetes Mother   ? Skin cancer Brother   ? Congenital heart disease Brother   ? ? ?Review of Systems: ?Constitutional: Denies fevers, chills or abnormal weight loss ?Eyes: Denies blurriness of vision ?Ears, nose, mouth, throat, and face: Denies mucositis or sore throat ?Respiratory: Denies cough, dyspnea or wheezes ?Cardiovascular: Denies palpitation, chest discomfort or lower extremity swelling ?Gastrointestinal:  Denies  nausea, constipation, diarrhea ?GU: Denies dysuria or incontinence ?Skin: Denies abnormal skin rashes ?Neurological: Per HPI ?Musculoskeletal: Denies joint pain, back or neck discomfort. No decrease in ROM ?Behavioral/Psych: Denies anxiety, disturbance in thought content, and mood instability ? ?Physical Exam: ?Vitals:  ? 05/01/22 0929  ?BP: 127/72  ?Pulse: 90  ?Resp: 17  ?Temp: 97.8 ?F (36.6 ?C)  ?SpO2: 98%  ? ?KPS: 60. ?General: Alert, cooperative, pleasant, in no acute distress ?Head: Craniotomy scar noted, dry and intact. ?EENT: No conjunctival injection or scleral icterus. Oral mucosa moist ?Lungs: Resp effort increased ?Cardiac: Regular rate and rhythm ?Abdomen: Soft, non-distended abdomen ?Skin: No rashes cyanosis or petechiae. ?Extremities: ++edema ? ?Neurologic Exam: ?Mental Status: Awake, alert, attentive to examiner. Oriented to self and environment. Language is fluent with intact comprehension.  ?Cranial Nerves: Visual  acuity is grossly normal. Visual fields are full. Extra-ocular movements intact. No ptosis. Face is symmetric, tongue midline. ?Motor: Tone and bulk are normal. Power is 3/5 in right arm and leg. Reflexes are symmetric, no pathologic reflexes present. Intact finger to nose bilaterally ?Sensory: Intact to light touch and temperature ?Gait: Hemiparetic, walker assisted ? ?Labs: ?I have reviewed the data as listed ?   ?Component Value Date/Time  ? NA 142 04/02/2022 0532  ? K 3.8 04/02/2022 0532  ? CL 113 (H) 04/02/2022 0532  ? CO2 25 04/02/2022 0532  ? GLUCOSE 126 (H) 04/02/2022 0532  ? BUN 21 04/02/2022 0532  ? CREATININE 0.72 04/02/2022 0532  ? CREATININE 0.84 12/03/2021 1228  ? CALCIUM 8.1 (L) 04/02/2022 0532  ? PROT 4.9 (L) 04/02/2022 0532  ? ALBUMIN 2.3 (L) 04/02/2022 0532  ? AST 20 04/02/2022 0532  ? ALT 44 04/02/2022 0532  ? ALKPHOS 66 04/02/2022 0532  ? BILITOT 0.6 04/02/2022 0532  ? GFRNONAA >60 04/02/2022 0532  ? GFRNONAA >60 12/03/2021 1228  ? GFRAA >60 11/26/2018 1231  ? ?Lab  Results  ?Component Value Date  ? WBC 10.1 05/01/2022  ? NEUTROABS 7.8 (H) 05/01/2022  ? HGB 13.0 05/01/2022  ? HCT 39.4 05/01/2022  ? MCV 96.3 05/01/2022  ? PLT 134 (L) 05/01/2022  ? ? ?Imaging: ? ?DG Chest 1 View ?

## 2022-05-02 ENCOUNTER — Telehealth: Payer: Self-pay

## 2022-05-02 NOTE — Telephone Encounter (Signed)
T/C from pt's wife stating [facility] has had a very rough day today.  He did not sleep well last night and barely able to move today.  He has a knot in the middle of his back at the waistline,is having leg cramps,has no energy and is leaning to the right while in his chair.   ? ?He took his last dose of steroids yesterday.  She is giving him tylenol and is using his patch for the pain, applying ice to his back and has increased his fluid intake. ? ?Advised her to continue to monitor him and take him to the ED if symptoms worsen or call 911 since it is hard for her to transfer him. She is in agreement with this plan. ? ?Please advise of any recommendations. ?

## 2022-05-03 ENCOUNTER — Emergency Department (HOSPITAL_COMMUNITY)
Admission: EM | Admit: 2022-05-03 | Discharge: 2022-05-03 | Disposition: A | Discharge status: Home / Self Care | Payer: Medicare Other | Attending: Emergency Medicine | Admitting: Emergency Medicine

## 2022-05-03 ENCOUNTER — Encounter (HOSPITAL_COMMUNITY): Payer: Self-pay

## 2022-05-03 ENCOUNTER — Other Ambulatory Visit: Payer: Self-pay

## 2022-05-03 DIAGNOSIS — R300 Dysuria: Secondary | ICD-10-CM | POA: Diagnosis present

## 2022-05-03 DIAGNOSIS — N320 Bladder-neck obstruction: Secondary | ICD-10-CM | POA: Insufficient documentation

## 2022-05-03 LAB — URINALYSIS, ROUTINE W REFLEX MICROSCOPIC
Bacteria, UA: NONE SEEN
Bilirubin Urine: NEGATIVE
Glucose, UA: NEGATIVE mg/dL
Ketones, ur: 5 mg/dL — AB
Leukocytes,Ua: NEGATIVE
Nitrite: NEGATIVE
Protein, ur: NEGATIVE mg/dL
Specific Gravity, Urine: 1.015 (ref 1.005–1.030)
pH: 7 (ref 5.0–8.0)

## 2022-05-03 MED ORDER — LIDOCAINE HCL URETHRAL/MUCOSAL 2 % EX GEL
1.0000 "application " | Freq: Once | CUTANEOUS | Status: AC
  Administered 2022-05-03: 1 via URETHRAL
  Filled 2022-05-03: qty 11

## 2022-05-03 NOTE — ED Notes (Signed)
Patient is resting comfortably. 

## 2022-05-03 NOTE — ED Notes (Signed)
[facility] called for pt. Transportation. ?

## 2022-05-03 NOTE — ED Notes (Signed)
Family at bedside. 

## 2022-05-03 NOTE — Discharge Instructions (Signed)
Please give your urologist a call on Monday and let them know what happened.  They likely will not see you in the office in about a week and then see if they can take the Foley out. ?

## 2022-05-03 NOTE — ED Triage Notes (Signed)
Pt. BIB [facility] c/o dysuria. Pt. Has had problems in the past and self catheterizes. Attempted self catheterization 4 times. Was successful the last attempt. No urine return. EMS reports blood. ? ?EMS VS: ?BP:120/90 ?HR:120 ?RR: 18 ?O2: 100% ?CBG: 180 ? ?

## 2022-05-03 NOTE — ED Notes (Signed)
Patient given warm blanket.

## 2022-05-03 NOTE — Progress Notes (Signed)
CSW received consult for patient to assist with transportation resources for outpatient appointments - CSW unaware of any transportation resources at this time due to [employer] being no longer in operation. ? ?Madilyn Fireman, MSW, LCSW ?Transitions of Care  Clinical Social Worker II ?[phone] ? ?

## 2022-05-03 NOTE — ED Provider Notes (Addendum)
[facility]-EMERGENCY DEPT Provider Note   CSN: [id number] Arrival date & time: 05/03/22  4098     History  Chief Complaint  Patient presents with   Dysuria    James Olsen is a 76 y.o. male.  76 yo M with a chief complaint of difficulty urinating.  The patient unfortunately has had trouble with this since he had a craniotomy about a year ago.  This seems to come and go he is to require catheterization but had improved and no longer needed.  Patient unfortunately had had worsening over the past 12 hours and got to the point where he is having severe difficulty urinating and was brought in for evaluation.  He actually feels like he was able to urinate a little bit since being picked up by EMS but is still having episodes where he feels that strong urge to urinate and is unable.   Dysuria Presenting symptoms: dysuria       Home Medications Prior to Admission medications   Medication Sig Start Date End Date Taking? Authorizing Provider  acetaminophen (TYLENOL) 500 MG tablet Take 1,000 mg by mouth daily as needed for moderate pain.   Yes [provider]  baclofen (LIORESAL) 10 MG tablet Take 1 tablet twice daily and 1 to 2 tablets at bedtime Patient taking differently: Take 10 mg by mouth 4 (four) times daily. 12/11/21  Yes Ranelle Oyster, MD  levETIRAcetam (KEPPRA) 500 MG tablet Take 2 tablets (1,000 mg total) by mouth 2 (two) times daily. 02/11/22  Yes Pollyann Savoy, MD  magnesium gluconate (MAGONATE) 500 MG tablet Take 500 mg by mouth 2 (two) times daily. Lunch & Evening   Yes [provider]  Multiple Vitamins-Minerals (PRESERVISION AREDS 2) CAPS Take 1 capsule by mouth 2 (two) times daily.    Yes [provider]  silodosin (RAPAFLO) 8 MG CAPS capsule Take 8 mg by mouth daily with breakfast.   Yes [provider]  dexamethasone (DECADRON) 2 MG tablet TAKE 2 TABLETS (4 MG TOTAL) BY MOUTH DAILY AND 1 TABLET (2 MG  TOTAL) AT BEDTIME. Patient not taking: Reported on 05/03/2022 04/14/22   Henreitta Leber, MD      Allergies    Patient has no known allergies.    Review of Systems   Review of Systems  Genitourinary:  Positive for dysuria.   Physical Exam Updated Vital Signs BP 113/83 (BP Location: Left Arm)   Pulse 96   Temp 97.9 F (36.6 C) (Oral)   Resp 16   SpO2 94%  Physical Exam Vitals and nursing note reviewed.  Constitutional:      Appearance: He is well-developed.  HENT:     Head: Normocephalic and atraumatic.  Eyes:     Pupils: Pupils are equal, round, and reactive to light.  Neck:     Vascular: No JVD.  Cardiovascular:     Rate and Rhythm: Normal rate and regular rhythm.     Heart sounds: No murmur heard.   No friction rub. No gallop.  Pulmonary:     Effort: No respiratory distress.     Breath sounds: No wheezing.  Abdominal:     General: There is no distension.     Tenderness: There is no abdominal tenderness. There is no guarding or rebound.     Comments: Suprapubic discomfort and fullness.  Genitourinary:    Penis: Normal.   Musculoskeletal:        General: Normal range of motion.  Cervical back: Normal range of motion and neck supple.  Skin:    Coloration: Skin is not pale.     Findings: No rash.  Neurological:     Mental Status: He is alert and oriented to person, place, and time.  Psychiatric:        Behavior: Behavior normal.    ED Results / Procedures / Treatments   Labs (all labs ordered are listed, but only abnormal results are displayed) Labs Reviewed  URINE CULTURE  URINALYSIS, ROUTINE W REFLEX MICROSCOPIC    EKG None  Radiology No results found.  Procedures BLADDER CATHETERIZATION  Date/Time: 05/03/2022 6:29 AM Performed by: Melene Plan, DO Authorized by: Melene Plan, DO   Consent:    Consent obtained:  Verbal   Consent given by:  Patient and spouse   Risks, benefits, and alternatives were discussed: yes     Risks discussed:  False  passage, infection, urethral injury, incomplete procedure and pain   Alternatives discussed:  No treatment, delayed treatment, alternative treatment and observation Universal protocol:    Procedure explained and questions answered to patient or proxy's satisfaction: yes     Imaging studies available: yes (bedside ultrasound)     Immediately prior to procedure, a time out was called: yes     Patient identity confirmed:  Verbally with patient Pre-procedure details:    Procedure purpose:  Therapeutic   Preparation: Patient was prepped and draped in usual sterile fashion   Anesthesia:    Anesthesia method:  Topical application   Topical anesthetic:  Lidocaine gel Procedure details:    Provider performed due to:  Nurse unable to complete   Catheter insertion:  Indwelling   Catheter type:  Coude   Catheter size:  18 Fr   Bladder irrigation: yes     Number of attempts:  2   Urine characteristics:  Bloody Post-procedure details:    Procedure completion:  Tolerated well, no immediate complications   Emergency Focused Ultrasound Exam Limited ultrasound of the Bladder.   Performed and interpreted by Dr. Adela Lank Indication: evaluation for urinary retention Transverse and Sagittal views of the bladder are obtained and calculations are performed to determine an estimated bladder volume for signs of post-renal obstruction.  Findings: Bladder is 240 ccfull Interpretation: with evidence of outlet obstruction Images archived electronically.  CPT Codes: 13086 and [phone]    Medications Ordered in ED Medications  lidocaine (XYLOCAINE) 2 % jelly 1 application. (1 application. Urethral Given 05/03/22 0439)    ED Course/ Medical Decision Making/ A&P                           Medical Decision Making Amount and/or Complexity of Data Reviewed Labs: ordered.   76 yo M with a chief complaint of difficulty urinating.  This unfortunate is been going on for him for about 10 months or so.  He has a  history of a meningioma requiring craniotomy.  Since then he has had some right-sided weakness and difficulty urinating.  He required self-catheterization in the past but had gotten better and did not require it. Over the past 12 hours has not really been able to urinate.  When he was picked up by EMS he did feel like he was urinate a little bit.  His postvoid residual is 240.  We will place a Foley catheter.  Foley placed by nursing without significant urine output.  There was some urine in the tube that appeared a bit  bloody.  Patient denied any bloody urine prior to self cathing at home.  I removed and replaced the Foley at bedside as I was unable to visualize the Foley bulb in the bladder.  Placement successful with visualized Foley bulb in the bladder.  However still without significant urine output.  He did have quite a large amount of grossly bloody urine with removal of the catheter.  We will have nursing irrigate and reassess.  Patient had some clots removed and now feels much better.  Now has clear appearing urine.  We will send a urine off for testing and culture.  Have him follow-up with his urologist.  6:31 AM:  I have discussed the diagnosis/risks/treatment options with the patient and family.  Evaluation and diagnostic testing in the emergency department does not suggest an emergent condition requiring admission or immediate intervention beyond what has been performed at this time.  They will follow up with  PCP. We also discussed returning to the ED immediately if new or worsening sx occur. We discussed the sx which are most concerning (e.g., sudden worsening pain, fever, inability to tolerate by mouth) that necessitate immediate return. Medications administered to the patient during their visit and any new prescriptions provided to the patient are listed below.  Medications given during this visit Medications  lidocaine (XYLOCAINE) 2 % jelly 1 application. (1 application. Urethral Given  05/03/22 0439)     The patient appears reasonably screen and/or stabilized for discharge and I doubt any other medical condition or other [facility] requiring further screening, evaluation, or treatment in the ED at this time prior to discharge.          Final Clinical Impression(s) / ED Diagnoses Final diagnoses:  Bladder outlet obstruction    Rx / DC Orders ED Discharge Orders     None         Melene Plan, DO 05/03/22 0631    Melene Plan, DO 05/03/22 1610

## 2022-05-04 ENCOUNTER — Encounter: Payer: Self-pay | Admitting: Internal Medicine

## 2022-05-04 LAB — URINE CULTURE: Culture: NO GROWTH

## 2022-05-05 ENCOUNTER — Ambulatory Visit: Payer: Medicare Other

## 2022-05-05 ENCOUNTER — Other Ambulatory Visit: Payer: Medicare Other

## 2022-05-05 ENCOUNTER — Inpatient Hospital Stay: Payer: Medicare Other | Admitting: Internal Medicine

## 2022-05-05 ENCOUNTER — Telehealth: Payer: Self-pay

## 2022-05-05 NOTE — Telephone Encounter (Signed)
Patient had foley catheter insertion at ED last week.   Is requesting order for management and care.  Please call back with verbal order.  Can leave VM if no answer.

## 2022-05-06 ENCOUNTER — Telehealth: Payer: Self-pay

## 2022-05-06 NOTE — Telephone Encounter (Signed)
I have spoken with spouse and have cancelled appt.    Please follow up with spouse in regard to what DME supply company the order will be sent to.    Thanks!

## 2022-05-06 NOTE — Telephone Encounter (Signed)
Gave verbal orders for management and care for foley catheter to number listed below. He voiced understanding and will contact facility for specifics.   ?

## 2022-05-06 NOTE — Telephone Encounter (Signed)
Patient's spouse has called in stating that patient is in need of a hospital bed as soon as possible.  Has just gotten discharged from the hospital.  [facility] was to fax order over for a hospital bed.    I have scheduled a virtual appt with patient for this Thursday 5/18 for DME order.    If DME order can be placed and approved without this visit, please follow up in regard with spouse.

## 2022-05-06 NOTE — Telephone Encounter (Signed)
Appointment is not needed. I will fax order to facility.  ? ?Thanks!  ?

## 2022-05-07 ENCOUNTER — Other Ambulatory Visit: Payer: Self-pay

## 2022-05-07 DIAGNOSIS — I69351 Hemiplegia and hemiparesis following cerebral infarction affecting right dominant side: Secondary | ICD-10-CM

## 2022-05-07 DIAGNOSIS — D329 Benign neoplasm of meninges, unspecified: Secondary | ICD-10-CM

## 2022-05-07 DIAGNOSIS — R531 Weakness: Secondary | ICD-10-CM

## 2022-05-07 NOTE — Telephone Encounter (Signed)
I spoke with wife, who is listed on DPR to make her aware that order has been placed, and faxed to [facility].  ?

## 2022-05-08 ENCOUNTER — Telehealth: Payer: Medicare Other | Admitting: Physician Assistant

## 2022-05-12 ENCOUNTER — Other Ambulatory Visit: Payer: Self-pay | Admitting: Internal Medicine

## 2022-05-12 NOTE — Telephone Encounter (Signed)
Called and spoke with Andee Poles at [facility] and confirmed fax number to send over recent office notes. Confirmed pt should be receiving Hospital bed this week and was advised could give pt and his wife her contact info for further questions.

## 2022-05-12 NOTE — Telephone Encounter (Signed)
James Olsen with [facility] is requesting call back at [phone].  States she is missing face to face office visit stating patient needs hospital bed.

## 2022-05-12 NOTE — Telephone Encounter (Signed)
Called pt wife back and lvm with DME order info; per Andee Poles at [facility] pt should be receiving the hospital bed this week. Advised on vm Danielle's contact info as well as office cb number with any further questions.

## 2022-05-13 ENCOUNTER — Ambulatory Visit: Payer: Medicare Other

## 2022-05-13 ENCOUNTER — Other Ambulatory Visit: Payer: Medicare Other

## 2022-05-13 ENCOUNTER — Ambulatory Visit: Payer: Medicare Other | Admitting: Internal Medicine

## 2022-05-14 ENCOUNTER — Encounter: Payer: Self-pay | Admitting: Physician Assistant

## 2022-05-15 NOTE — Telephone Encounter (Signed)
Sonia Baller with [facility] called for VO to extend therapy for one week and advised of verbal orders per provider.

## 2022-05-16 ENCOUNTER — Ambulatory Visit (HOSPITAL_COMMUNITY)
Admission: RE | Admit: 2022-05-16 | Discharge: 2022-05-16 | Disposition: A | Discharge status: Home / Self Care | Payer: Medicare Other | Source: Ambulatory Visit | Attending: Internal Medicine | Admitting: Internal Medicine

## 2022-05-16 DIAGNOSIS — D420 Neoplasm of uncertain behavior of cerebral meninges: Secondary | ICD-10-CM | POA: Diagnosis present

## 2022-05-16 MED ORDER — GADOBUTROL 1 MMOL/ML IV SOLN
8.0000 mL | Freq: Once | INTRAVENOUS | Status: AC | PRN
  Administered 2022-05-16: 8 mL via INTRAVENOUS

## 2022-05-20 ENCOUNTER — Other Ambulatory Visit: Payer: Self-pay | Admitting: Internal Medicine

## 2022-05-22 ENCOUNTER — Inpatient Hospital Stay (HOSPITAL_BASED_OUTPATIENT_CLINIC_OR_DEPARTMENT_OTHER): Payer: Medicare Other | Admitting: Internal Medicine

## 2022-05-22 ENCOUNTER — Ambulatory Visit: Payer: Medicare Other

## 2022-05-22 ENCOUNTER — Inpatient Hospital Stay: Payer: Medicare Other | Attending: Internal Medicine

## 2022-05-22 ENCOUNTER — Other Ambulatory Visit: Payer: Self-pay

## 2022-05-22 ENCOUNTER — Inpatient Hospital Stay: Payer: Medicare Other

## 2022-05-22 ENCOUNTER — Encounter: Payer: Self-pay | Admitting: Internal Medicine

## 2022-05-22 VITALS — BP 108/79 | HR 93 | Temp 97.3°F | Resp 18

## 2022-05-22 DIAGNOSIS — R5383 Other fatigue: Secondary | ICD-10-CM | POA: Diagnosis not present

## 2022-05-22 DIAGNOSIS — R569 Unspecified convulsions: Secondary | ICD-10-CM

## 2022-05-22 DIAGNOSIS — R531 Weakness: Secondary | ICD-10-CM | POA: Insufficient documentation

## 2022-05-22 DIAGNOSIS — D420 Neoplasm of uncertain behavior of cerebral meninges: Secondary | ICD-10-CM | POA: Diagnosis not present

## 2022-05-22 DIAGNOSIS — Z79899 Other long term (current) drug therapy: Secondary | ICD-10-CM | POA: Diagnosis not present

## 2022-05-22 DIAGNOSIS — I6789 Other cerebrovascular disease: Secondary | ICD-10-CM | POA: Diagnosis not present

## 2022-05-22 DIAGNOSIS — C713 Malignant neoplasm of parietal lobe: Secondary | ICD-10-CM | POA: Diagnosis present

## 2022-05-22 DIAGNOSIS — Y842 Radiological procedure and radiotherapy as the cause of abnormal reaction of the patient, or of later complication, without mention of misadventure at the time of the procedure: Secondary | ICD-10-CM

## 2022-05-22 LAB — CBC WITH DIFFERENTIAL (CANCER CENTER ONLY)
Abs Immature Granulocytes: 0.08 10*3/uL — ABNORMAL HIGH (ref 0.00–0.07)
Basophils Absolute: 0.1 10*3/uL (ref 0.0–0.1)
Basophils Relative: 1 %
Eosinophils Absolute: 0.1 10*3/uL (ref 0.0–0.5)
Eosinophils Relative: 1 %
HCT: 43.2 % (ref 39.0–52.0)
Hemoglobin: 14.3 g/dL (ref 13.0–17.0)
Immature Granulocytes: 1 %
Lymphocytes Relative: 10 %
Lymphs Abs: 1.3 10*3/uL (ref 0.7–4.0)
MCH: 31.8 pg (ref 26.0–34.0)
MCHC: 33.1 g/dL (ref 30.0–36.0)
MCV: 96 fL (ref 80.0–100.0)
Monocytes Absolute: 0.8 10*3/uL (ref 0.1–1.0)
Monocytes Relative: 6 %
Neutro Abs: 10.6 10*3/uL — ABNORMAL HIGH (ref 1.7–7.7)
Neutrophils Relative %: 81 %
Platelet Count: 238 10*3/uL (ref 150–400)
RBC: 4.5 MIL/uL (ref 4.22–5.81)
RDW: 14.1 % (ref 11.5–15.5)
WBC Count: 12.9 10*3/uL — ABNORMAL HIGH (ref 4.0–10.5)
nRBC: 0 % (ref 0.0–0.2)

## 2022-05-22 LAB — TOTAL PROTEIN, URINE DIPSTICK: Protein, ur: NEGATIVE mg/dL

## 2022-05-22 NOTE — Progress Notes (Signed)
[place] at [place], [place] [address] [phone]   Interval Evaluation  Date of Service: 05/22/22 Patient Name: James Olsen Patient MRN: [id number] Patient DOB: 1946/02/22 Provider: [name], MD  Identifying Statement:  James Olsen is a 76 y.o. 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  He describes overall stability of right sided weakness, lethargy, word finding difficulty.  He experienced significant pain and discomfort following urethral clot/obstruction; this was cleared in the ED, he now has foley placed and follow up with urology.  Otherwise continues on Keppra '1000mg'$  twice per day, no recurrence of seizures.  Still working with PT and home health.     H+P (01/20/19) Patient presented to medical attention in the winter of 2019 after noticing a "bulging" coming from the top of his head.  He also had experienced some numbness of his left leg at the time.  He obtained an MRI which demonstrated a dural based mass extending through the skull.  After a period of monitoring he underwent resection on 10/29/18 with Dr. Christella Noa.  Post-operatively he was very weak on the right side and was found to have a stroke.  After extensive rehabilitation he regained most of his right sided function.  Because of residual tumor and histology (WHO II) radiation was started on 12/19 with Dr. Isidore Olsen.  Next week is his final session which he has tolerated without ill effect.  Medications: Current Outpatient Medications on File  Prior to Visit  Medication Sig Dispense Refill   acetaminophen (TYLENOL) 500 MG tablet Take 1,000 mg by mouth daily as needed for moderate pain.     baclofen (LIORESAL) 10 MG tablet Take 1 tablet twice daily and 1 to 2 tablets at bedtime (Patient taking differently: Take 10 mg by mouth 4 (four) times daily.) 360 tablet 2   dexamethasone (DECADRON) 2 MG tablet TAKE 2 TABLETS (4 MG TOTAL) BY MOUTH DAILY AND 1 TABLET (2 MG TOTAL) AT BEDTIME. 90 tablet 2   levETIRAcetam (KEPPRA) 500 MG tablet TAKE 1 TABLET BY MOUTH TWICE A DAY (Patient taking differently: 1,000 mg 2 (two) times daily.) 60 tablet 3   magnesium gluconate (MAGONATE) 500 MG tablet Take 500 mg by mouth 2 (two) times daily. Lunch & Evening     Multiple Vitamins-Minerals (PRESERVISION AREDS 2) CAPS Take 1 capsule by mouth 2 (two) times daily.      silodosin (RAPAFLO) 8 MG CAPS capsule Take 8 mg by mouth daily with breakfast.     No current facility-administered medications on file prior to visit.    Allergies: No Known Allergies Past Medical History:  Past Medical History:  Diagnosis Date   Allergy    Arthritis    Cataract    GERD (gastroesophageal reflux disease)    Glaucoma    pt denies.    History of blood transfusion     5 units after previous surgery   History of radiation therapy 12/09/18- 01/24/2019   Left sagittal brain, 11.8 Gy in 31 fractions for a total dose of 55.8 Gy   Hyperlipidemia    Macular degeneration  Seizures ([place])    Stroke [facility])    after surgery in August 2019 mostly recovered still some weakness in left arm   Wears glasses    Past Surgical History:  Past Surgical History:  Procedure Laterality Date   APPLICATION OF CRANIAL NAVIGATION N/A 06/21/2021   Procedure: APPLICATION OF CRANIAL NAVIGATION;  Surgeon: [name], MD;  Location: [place];  Service: Neurosurgery;  Laterality: N/A;   BRAIN SURGERY     CHOLECYSTECTOMY  2008   lap choli   COLONOSCOPY     CRANIOPLASTY N/A 10/29/2018   Procedure:  CRANIOPLASTY;  Surgeon: [name], MD;  Location: [place];  Service: Neurosurgery;  Laterality: N/A;  CRANIOPLASTY   CRANIOTOMY N/A 08/18/2018   Procedure: CRANIOTOMY TUMOR EXCISION;  Surgeon: [name], MD;  Location: [place];  Service: Neurosurgery;  Laterality: N/A;  CRANIOTOMY TUMOR EXCISION   CRANIOTOMY Bilateral 06/21/2021   Procedure: Bilateral Craniotomy for tumor resection;  Surgeon: [name], MD;  Location: [place];  Service: Neurosurgery;  Laterality: Bilateral;   DIAGNOSTIC LAPAROSCOPY     EYE SURGERY     laser surgery to relieve pressure   NAILBED REPAIR Left 05/04/2014   Procedure: LEFT INDEX NAIL ABLATION V-Y FLAP COVERAGE;  Surgeon: [name]., MD;  Location: [place];  Service: Orthopedics;  Laterality: Left;  index   PILONIDAL CYST EXCISION     x2   TONSILLECTOMY     VASECTOMY     Social History:  Social History   Socioeconomic History   Marital status: Married    Spouse name: Not on file   Number of children: Not on file   Years of education: Not on file   Highest education level: Not on file  Occupational History   Not on file  Tobacco Use   Smoking status: Never   Smokeless tobacco: Never  Vaping Use   Vaping Use: Never used  Substance and Sexual Activity   Alcohol use: Yes    Comment: occasionally   Drug use: No   Sexual activity: Not Currently  Other Topics Concern   Not on file  Social History Narrative   Not on file   Social Determinants of Health   Financial Resource Strain: Not on file  Food Insecurity: Not on file  Transportation Needs: Not on file  Physical Activity: Not on file  Stress: Not on file  Social Connections: Not on file  Intimate Partner Violence: Not on file   Family History:  Family History  Problem Relation Age of Onset   Diabetes Mother    Skin cancer Brother    Congenital heart disease Brother     Review of Systems: Constitutional: Denies fevers, chills or abnormal weight  loss Eyes: Denies blurriness of vision Ears, nose, mouth, throat, and face: Denies mucositis or sore throat Respiratory: Denies cough, dyspnea or wheezes Cardiovascular: Denies palpitation, chest discomfort or lower extremity swelling Gastrointestinal:  Denies nausea, constipation, diarrhea GU: Denies dysuria or incontinence Skin: Denies abnormal skin rashes Neurological: Per HPI Musculoskeletal: Denies joint pain, back or neck discomfort. No decrease in ROM Behavioral/Psych: Denies anxiety, disturbance in thought content, and mood instability  Physical Exam: Vitals:   05/22/22 1113  BP: 108/79  Pulse: 93  Resp: 18  Temp: (!) 97.3 F (36.3 C)  SpO2: 99%   KPS: 60. General: Alert, cooperative, pleasant, in no acute distress Head: Craniotomy scar noted, two mass lesions palpable within soft tissue EENT: No conjunctival injection or scleral icterus. Oral  mucosa moist Lungs: Resp effort increased Cardiac: Regular rate and rhythm Abdomen: Soft, non-distended abdomen Skin: No rashes cyanosis or petechiae. Extremities: normal  Neurologic Exam: Mental Status: Awake, alert, attentive to examiner. Oriented to self and environment. Language is noted for mild expressive dysphasia with intact comprehension.  Cranial Nerves: Visual acuity is grossly normal. Visual fields are full. Extra-ocular movements intact. No ptosis. Face is symmetric, tongue midline. Motor: Tone and bulk are normal. Power is 3/5 in right arm and leg. Reflexes are symmetric, no pathologic reflexes present. Intact finger to nose bilaterally Sensory: Intact to light touch and temperature Gait: Hemiparetic, walker assisted  Labs: I have reviewed the data as listed    Component Value Date/Time   NA 142 04/02/2022 0532   K 3.8 04/02/2022 0532   CL 113 (H) 04/02/2022 0532   CO2 25 04/02/2022 0532   GLUCOSE 126 (H) 04/02/2022 0532   BUN 21 04/02/2022 0532   CREATININE 0.72 04/02/2022 0532   CREATININE 0.84  12/03/2021 1228   CALCIUM 8.1 (L) 04/02/2022 0532   PROT 4.9 (L) 04/02/2022 0532   ALBUMIN 2.3 (L) 04/02/2022 0532   AST 20 04/02/2022 0532   ALT 44 04/02/2022 0532   ALKPHOS 66 04/02/2022 0532   BILITOT 0.6 04/02/2022 0532   GFRNONAA >60 04/02/2022 0532   GFRNONAA >60 12/03/2021 1228   GFRAA >60 11/26/2018 1231   Lab Results  Component Value Date   WBC 12.9 (H) 05/22/2022   NEUTROABS 10.6 (H) 05/22/2022   HGB 14.3 05/22/2022   HCT 43.2 05/22/2022   MCV 96.0 05/22/2022   PLT 238 05/22/2022    Imaging:  MR Brain W Wo Contrast  Result Date: 05/18/2022 CLINICAL DATA:  Brain/CNS neoplasm, assess treatment response. History of grade 2 meningioma status post resection in 2019 followed by radiation therapy. Repeat craniotomy in 06/2021 and SRS in 12/2021. EXAM: MRI HEAD WITHOUT AND WITH CONTRAST TECHNIQUE: Multiplanar, multiecho pulse sequences of the brain and surrounding structures were obtained without and with intravenous contrast. CONTRAST:  25m GADAVIST GADOBUTROL 1 MMOL/ML IV SOLN COMPARISON:  Head CT 04/01/2022 and MRI 02/26/2022 FINDINGS: Brain: Postsurgical changes from craniotomies are again seen bilaterally at the vertex. Nodular enhancing extra-axial soft tissue immediately subjacent to the craniotomy at the vertex has not significantly changed, however there is progressive multifocal enhancing tumor involving the skull/craniotomy site and extending into the scalp, with the largest measuring 2.3 x 1.6 cm laterally on the right (series 1100, image 250). Nodular enhancement along the falx has also increased, measuring 1.5 x 0.9 cm (series 9, image 28 and series 1100, image 233). Confluent T2 hyperintensity in the left greater than right frontoparietal white matter towards the vertex has not significantly changed. Patchy T2 hyperintensities elsewhere in the cerebral white matter bilaterally are stable to mildly increased and may reflect a combination of chronic small vessel ischemia and  post treatment changes. A 1.8 x 1.2 cm meningioma laterally over the posterior left frontal convexity demonstrates increasingly heterogeneous enhancement compared to the prior MRI but has not significantly changed in size, and there is no significant associated edema. No acute infarct or acute intracranial hemorrhage is evident. There is mild to moderate cerebral atrophy. An extra-axial fluid collection at the craniotomy site is unchanged. Vascular: Major intracranial vascular flow voids are preserved. Skull and upper cervical spine: Postsurgical changes with tumor extending through the craniotomy/skull as noted above. Sinuses/Orbits: Unremarkable orbits. Trace mucosal thickening in the paranasal sinuses. Clear mastoid air cells. Other: None. IMPRESSION: 1. Progressive  tumor involving the skull/craniotomy site with extracranial extension into the scalp. 2. Progressive small volume tumor along the falx. 3. Unchanged separate meningioma laterally over the left frontal convexity. Electronically Signed   By: Logan Bores M.D.   On: 05/18/2022 11:57     Assessment/Plan 1. 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  He is improved from pain/urologic standpoint as well, now with indwelling foley catheter.    Brain MRI unfortunately demonstrates progression of meningioma within falx, as well as soft tissue/scalp lateral to primary site of disease.  Neither of these areas was within the radiosurgery field in January.  The falcine region was involved in IMRT field but did not become visible until November 2022.     We recommended discontinuing avastin due to progression of disease and clotting issues.   For progressive disease, further radiation could be considered.  This will be discussed in brain/spine tumor board next week.    Also for consideration would be further systemic therapy.  Would look into Everolimus and Octreotide based on recent  CEVOREM trial data: https://[email or web address]/clincancerres/article/26/3/552/83246/Everolimus-and-Octreotide-for-Patients-with  Keppra can remain at '1000mg'$  BID for now.  We will give [name] a call early next week following our tumor board discussion.  All questions were answered. The patient knows to call the clinic with any problems, questions or concerns. No barriers to learning were detected.  The total time spent in the encounter was 40 minutes and more than 50% was on counseling and review of test results   [name], MD Medical Director of Neuro-Oncology [facility] at [place] 05/22/22 11:33 AM

## 2022-05-23 ENCOUNTER — Telehealth: Payer: Self-pay

## 2022-05-23 MED ORDER — LEVETIRACETAM 500 MG PO TABS: 1000.0000 mg | ORAL_TABLET | Freq: Two times a day (BID) | ORAL | 3 refills | Status: DC

## 2022-05-23 NOTE — Telephone Encounter (Signed)
This nurse returned call to patients spouse and made aware that provider stated to stop taking the Decadron and a refill for the [place] will be called in.  She acknowledged understanding.  No further questions or concerns at this time.

## 2022-05-23 NOTE — Telephone Encounter (Signed)
This nurse received a call from this patients spouse that she wanted to verify if provider wanted her to stop giving the patient Decadron because he has completed the taper or is he supposed to continue.  She also states that patient needs a refill on his Keppra.  Information will be forwarded to MD.  No further concerns or questions noted.

## 2022-05-26 ENCOUNTER — Telehealth: Payer: Self-pay | Admitting: Physician Assistant

## 2022-05-26 ENCOUNTER — Other Ambulatory Visit: Payer: Self-pay | Admitting: Radiation Therapy

## 2022-05-26 ENCOUNTER — Inpatient Hospital Stay: Payer: Medicare Other

## 2022-05-26 ENCOUNTER — Inpatient Hospital Stay (HOSPITAL_BASED_OUTPATIENT_CLINIC_OR_DEPARTMENT_OTHER): Payer: Medicare Other | Admitting: Internal Medicine

## 2022-05-26 DIAGNOSIS — D420 Neoplasm of uncertain behavior of cerebral meninges: Secondary | ICD-10-CM

## 2022-05-26 DIAGNOSIS — D329 Benign neoplasm of meninges, unspecified: Secondary | ICD-10-CM

## 2022-05-26 DIAGNOSIS — R569 Unspecified convulsions: Secondary | ICD-10-CM

## 2022-05-26 NOTE — Progress Notes (Signed)
I connected with James Olsen on 05/26/22 at 10:00 AM EDT by telephone visit and verified that I am speaking with the correct person using two identifiers.  I discussed the limitations, risks, security and privacy concerns of performing an evaluation and management service by telemedicine and the availability of in-person appointments. I also discussed with the patient that there may be a patient responsible charge related to this service. The patient expressed understanding and agreed to proceed.  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  No seizures or headaches.  Transport is arranged now for his urology visit. 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  His prior RT treatment plans were fused with his current MRI brain.  The regions of progression within the falx and convexity soft tissue were not within [facility] field, and were outside of high dose IMRT field.    We did recommend moving forward with salvage fractionated SRS, over chemotherapy protocol discussed.  He is agreeable with this.  No further avastin recommended at this time.    He is also agreeable to meet with palliative care.  Follow Up Instructions: Pending radiation treatment plan  I discussed the assessment and treatment plan with the patient.  The patient was provided an opportunity to ask questions and all were answered.  The patient agreed with the plan and demonstrated understanding of the instructions.    The patient was advised to call back or seek an in-person evaluation if the  symptoms worsen or if the condition fails to improve as anticipated.  I provided 5-10 minutes of non-face-to-face time during this enocunter.  [name], MD   I provided 22 minutes of non face-to-face telephone visit time during this encounter, and > 50% was spent counseling as documented under my assessment & plan.

## 2022-05-26 NOTE — Telephone Encounter (Signed)
..  Home Health Verbal Orders  Agency:  [facility]   Caller: Haskel Khan and title: Speech Therapist [phone]  Requesting OT/ PT/ Skilled nursing/ Social Work/ Speech:  Speech Therapy   Reason for Request:  To re-certify home health speech therapy   Frequency:  Once every other week x 6 weeks  HH needs F2F w/in last 30 days

## 2022-05-26 NOTE — Telephone Encounter (Signed)
LVM with Sonia Baller to ok the verbal orders and also ask that she provides me with a fax # to fax over the F2F visit within the last 30 days.

## 2022-05-27 ENCOUNTER — Other Ambulatory Visit: Payer: Self-pay

## 2022-05-27 DIAGNOSIS — D420 Neoplasm of uncertain behavior of cerebral meninges: Secondary | ICD-10-CM

## 2022-05-28 ENCOUNTER — Other Ambulatory Visit: Payer: Self-pay | Admitting: Radiation Oncology

## 2022-05-28 DIAGNOSIS — D320 Benign neoplasm of cerebral meninges: Secondary | ICD-10-CM

## 2022-05-28 MED ORDER — LORAZEPAM 1 MG PO TABS: ORAL_TABLET | ORAL | 0 refills | Status: AC

## 2022-05-30 ENCOUNTER — Telehealth: Payer: Self-pay | Admitting: Radiation Therapy

## 2022-05-30 NOTE — Telephone Encounter (Signed)
I received a call from Mrs. Pickard this morning, concerned that James Olsen has developed some bumps on his scalp that are tender and sometimes itchy. She wanted to know if she should be putting anything on this area, such as antibiotic ointment.    Dr. Pearlie Oyster reply:   Hi, I don't think ABX ointment is going to help, but he can try OTC hydrocortisone cream/ ointment PRN to see if that helps with itching. For the tenderness, I'd recommend his OTC pain reliever medication of choice (ie Tylenol).   I called Mrs. Paulson back to share Dr. Pearlie Oyster recommendations, she was understanding and thankful for the call.  Mont Dutton R.T.(R)(T) Radiation Special Procedures Navigator

## 2022-06-02 ENCOUNTER — Encounter: Payer: Self-pay | Admitting: Physician Assistant

## 2022-06-02 ENCOUNTER — Ambulatory Visit (INDEPENDENT_AMBULATORY_CARE_PROVIDER_SITE_OTHER): Payer: Medicare Other | Admitting: Physician Assistant

## 2022-06-02 VITALS — BP 105/72 | HR 97 | Temp 98.4°F | Ht 69.0 in | Wt 175.0 lb

## 2022-06-02 DIAGNOSIS — E559 Vitamin D deficiency, unspecified: Secondary | ICD-10-CM

## 2022-06-02 DIAGNOSIS — R7303 Prediabetes: Secondary | ICD-10-CM

## 2022-06-02 DIAGNOSIS — R531 Weakness: Secondary | ICD-10-CM | POA: Diagnosis not present

## 2022-06-02 DIAGNOSIS — D329 Benign neoplasm of meninges, unspecified: Secondary | ICD-10-CM

## 2022-06-02 DIAGNOSIS — Z7401 Bed confinement status: Secondary | ICD-10-CM | POA: Diagnosis not present

## 2022-06-02 LAB — CBC WITH DIFFERENTIAL/PLATELET
Basophils Absolute: 0.1 10*3/uL (ref 0.0–0.1)
Basophils Relative: 1.1 % (ref 0.0–3.0)
Eosinophils Absolute: 0.1 10*3/uL (ref 0.0–0.7)
Eosinophils Relative: 2.2 % (ref 0.0–5.0)
HCT: 41.9 % (ref 39.0–52.0)
Hemoglobin: 13.9 g/dL (ref 13.0–17.0)
Lymphocytes Relative: 17 % (ref 12.0–46.0)
Lymphs Abs: 1 10*3/uL (ref 0.7–4.0)
MCHC: 33.2 g/dL (ref 30.0–36.0)
MCV: 94.7 fl (ref 78.0–100.0)
Monocytes Absolute: 0.5 10*3/uL (ref 0.1–1.0)
Monocytes Relative: 8.5 % (ref 3.0–12.0)
Neutro Abs: 4.1 10*3/uL (ref 1.4–7.7)
Neutrophils Relative %: 71.2 % (ref 43.0–77.0)
Platelets: 230 10*3/uL (ref 150.0–400.0)
RBC: 4.42 Mil/uL (ref 4.22–5.81)
RDW: 14 % (ref 11.5–15.5)
WBC: 5.7 10*3/uL (ref 4.0–10.5)

## 2022-06-02 LAB — COMPREHENSIVE METABOLIC PANEL
ALT: 38 U/L (ref 0–53)
AST: 19 U/L (ref 0–37)
Albumin: 3.5 g/dL (ref 3.5–5.2)
Alkaline Phosphatase: 92 U/L (ref 39–117)
BUN: 17 mg/dL (ref 6–23)
CO2: 28 mEq/L (ref 19–32)
Calcium: 9.3 mg/dL (ref 8.4–10.5)
Chloride: 101 mEq/L (ref 96–112)
Creatinine, Ser: 0.66 mg/dL (ref 0.40–1.50)
GFR: 92.3 mL/min (ref >60.00)
Glucose, Bld: 189 mg/dL — ABNORMAL HIGH (ref 70–99)
Potassium: 4 mEq/L (ref 3.5–5.1)
Sodium: 137 mEq/L (ref 135–145)
Total Bilirubin: 0.3 mg/dL (ref 0.2–1.2)
Total Protein: 6.4 g/dL (ref 6.0–8.3)

## 2022-06-02 LAB — HEMOGLOBIN A1C: Hgb A1c MFr Bld: 5.8 % (ref 4.6–6.5)

## 2022-06-02 MED ORDER — VITAMIN D (ERGOCALCIFEROL) 1.25 MG (50000 UNIT) PO CAPS: 50000.0000 [IU] | ORAL_CAPSULE | ORAL | 0 refills | Status: DC

## 2022-06-02 NOTE — Telephone Encounter (Signed)
[facility] calling to receive clarification for pelvic ultrasound sch for Wednesday -   [facility] would like to know if its a pelvic complete ruling out a hernia??- if not should it be limited?  And does provider want patient to drink liquid before hand? Please call /   opt  then opt .

## 2022-06-02 NOTE — Patient Instructions (Addendum)
Very good to see you as always!  Labs today, call with results.  Start on Vit D regimen as I have sent to your pharmacy.   Think about adding Trazodone for help with falling asleep / mild- anti-depressant.  Keep working on strengthening exercises. Change positions / watch for bed sores. Keep brain active.

## 2022-06-02 NOTE — Telephone Encounter (Signed)
Please advise and I will call back to confirm with [facility]

## 2022-06-02 NOTE — Progress Notes (Signed)
Subjective:    Patient ID: James Olsen, male    DOB: [birth year], 76 y.o.   MRN: [id number]  Chief Complaint  Patient presents with   Follow-up    Pt being seen as 3 mon f/u; pt states he is going to be doing a 5 treatments of radiation after MRI on the 15th. Meeting with pallative care coordinator. Last treatment being July 7th; pt has been a little depressed since being bed bound but has a wonderful support system thus far. Wants to discuss a MOST form.     HPI Patient with history of atypical meningiomas and focal seizures is in today for 3 month f/up. He is here with his wife today.   Most recent visit with Dr. Mickeal Skinner on 05/26/22: "James Olsen's case was reviewed carefully in brain/spine tumor board this morning.  His prior RT treatment plans were fused with his current MRI brain.  The regions of progression within the falx and convexity soft tissue were not within [facility] field, and were outside of high dose IMRT field.     We did recommend moving forward with salvage fractionated SRS, over chemotherapy protocol discussed.  He is agreeable with this.   No further avastin recommended at this time.     He is also agreeable to meet with palliative care."  Working with Billy Fischer - aging advocate - brought up MOST form - they are working on some of this right now. Wife talking to skilled nursing to try to have coverage on the weekends also.  -Palliative care coordinator coming out for initial meeting on 06/10/22   TODAY: -Feeling "blah" today -Denies any pain; between skull and skin - has about 4 flare-ups of pain a day. Tylenol / hydrocortisone 1% for occasional itching.  -Last few days constipation / liquid back and forth - no blood in stool Mix of food lately - home-cooked / some restaurant foods Good appetite; sticking with proteins, nothing to heavy, not too many sweets -Hospital bed at home has been very helpful -Still having a lot of weakness; used transfer board and help of  home health aide to get out of bed to wheelchair -Really slow at processing under duress  -Waking up first thing in the morning, sometimes in the night, has a lot of anxiousness and needs 1/2 tablet of Ativan 1 mg as needed -Some friends visit, usually max of 1 hour at a time; two cats - but they don't come in the bedroom - concern about puncturing foley    Past Medical History:  Diagnosis Date   Allergy    Arthritis    Cataract    GERD (gastroesophageal reflux disease)    Glaucoma    pt denies.    History of blood transfusion     5 units after previous surgery   History of radiation therapy 12/09/18- 01/24/2019   Left sagittal brain, 11.8 Gy in 31 fractions for a total dose of 55.8 Gy   Hyperlipidemia    Macular degeneration    Seizures ([place])    Stroke [facility])    after surgery in August 2019 mostly recovered still some weakness in left arm   Wears glasses     Past Surgical History:  Procedure Laterality Date   APPLICATION OF CRANIAL NAVIGATION N/A 06/21/2021   Procedure: APPLICATION OF CRANIAL NAVIGATION;  Surgeon: [name], MD;  Location: [place];  Service: Neurosurgery;  Laterality: N/A;   BRAIN SURGERY     CHOLECYSTECTOMY  2008   lap  choli   COLONOSCOPY     CRANIOPLASTY N/A 10/29/2018   Procedure: CRANIOPLASTY;  Surgeon: [name], MD;  Location: [place];  Service: Neurosurgery;  Laterality: N/A;  CRANIOPLASTY   CRANIOTOMY N/A 08/18/2018   Procedure: CRANIOTOMY TUMOR EXCISION;  Surgeon: [name], MD;  Location: [place];  Service: Neurosurgery;  Laterality: N/A;  CRANIOTOMY TUMOR EXCISION   CRANIOTOMY Bilateral 06/21/2021   Procedure: Bilateral Craniotomy for tumor resection;  Surgeon: [name], MD;  Location: [place];  Service: Neurosurgery;  Laterality: Bilateral;   DIAGNOSTIC LAPAROSCOPY     EYE SURGERY     laser surgery to relieve pressure   NAILBED REPAIR Left 05/04/2014   Procedure: LEFT INDEX NAIL ABLATION V-Y FLAP COVERAGE;  Surgeon: [name]., MD;   Location: [place];  Service: Orthopedics;  Laterality: Left;  index   PILONIDAL CYST EXCISION     x2   TONSILLECTOMY     VASECTOMY      Family History  Problem Relation Age of Onset   Diabetes Mother    Skin cancer Brother    Congenital heart disease Brother     Social History   Tobacco Use   Smoking status: Never   Smokeless tobacco: Never  Vaping Use   Vaping Use: Never used  Substance Use Topics   Alcohol use: Yes    Comment: occasionally   Drug use: No     No Known Allergies  Review of Systems NEGATIVE UNLESS OTHERWISE INDICATED IN HPI      Objective:     BP 105/72 (BP Location: Left Arm)   Pulse 97   Temp 98.4 F (36.9 C) (Temporal)   Ht '5\' 9"'$  (1.753 m)   Wt 175 lb (79.4 kg)   SpO2 98%   BMI 25.84 kg/m   Wt Readings from Last 3 Encounters:  06/02/22 175 lb (79.4 kg)  05/01/22 175 lb 9.6 oz (79.7 kg)  04/02/22 193 lb 12.6 oz (87.9 kg)    BP Readings from Last 3 Encounters:  06/02/22 105/72  05/22/22 108/79  05/03/22 119/80     Physical Exam Vitals and nursing note reviewed.  Constitutional:      General: He is not in acute distress.    Appearance: Normal appearance. He is cachectic. He is ill-appearing. He is not toxic-appearing or diaphoretic.     Comments: Currently in a wheelchair  HENT:     Head: Normocephalic and atraumatic.     Right Ear: Tympanic membrane, ear canal and external ear normal.     Left Ear: Tympanic membrane, ear canal and external ear normal.     Nose: Nose normal.     Mouth/Throat:     Mouth: Mucous membranes are moist.     Pharynx: Oropharynx is clear.  Eyes:     Extraocular Movements: Extraocular movements intact.     Conjunctiva/sclera: Conjunctivae normal.     Pupils: Pupils are equal, round, and reactive to light.  Cardiovascular:     Rate and Rhythm: Normal rate and regular rhythm.     Pulses: Normal pulses.     Heart sounds: Normal heart sounds.  Pulmonary:     Effort: Pulmonary  effort is normal.     Breath sounds: Normal breath sounds.  Abdominal:     General: Abdomen is flat. Bowel sounds are normal.     Palpations: Abdomen is soft.     Tenderness: There is no abdominal tenderness.  Genitourinary:    Comments: Foley catheter  in place. Urine clear pale / yellow.  Musculoskeletal:        General: Deformity (left index finger - distal phalanx surgically missing) present. No swelling.     Cervical back: Normal range of motion and neck supple.     Right lower leg: No edema.     Left lower leg: No edema.     Comments: Very weak to push / pull against me in all extremities; muscle wasting noted  Skin:    General: Skin is warm and dry.  Neurological:     General: No focal deficit present.     Mental Status: He is alert and oriented to person, place, and time.  Psychiatric:        Mood and Affect: Mood normal.        Behavior: Behavior normal.        Assessment & Plan:   Problem List Items Addressed This Visit   None Visit Diagnoses     Meningioma ([place])    -  Primary   Weakness       Relevant Orders   CBC with Differential/Platelet (Completed)   Comprehensive metabolic panel (Completed)   Hemoglobin A1c (Completed)   Bedbound       Vitamin D deficiency       Prediabetes       Relevant Orders   Comprehensive metabolic panel (Completed)   Hemoglobin A1c (Completed)        Meds ordered this encounter  Medications   Vitamin D, Ergocalciferol, (DRISDOL) 1.25 MG (50000 UNIT) CAPS capsule    Sig: Take 1 capsule (50,000 Units total) by mouth every 7 (seven) days.    Dispense:  12 capsule    Refill:  0    Order Specific Question:   Supervising Provider    Answer:   Garret Reddish O [[id number]]    1. Meningioma ([place]) 2. Weakness 3. Bedbound I personally reviewed patient's most recent office visit with his oncologist, Dr. Mickeal Skinner.  I also reviewed patient's blood work from the last month. The patient is showing decline in both mental and physical  health.  He will continue to work with home care nursing.  He is also meeting with palliative care.  Hospital bed at home has been really helpful for him.  They are also looking into having more coverage for nursing assistance to come in on the weekends.  Also talked with patient about his overall mental health and sleeping concerns, offered assistance with this, he will let me know. -Patient encouraged to keep working on strengthening exercises, ways to keep the brain active. -He knows to change positions frequently and watch out for bedsores.  4. Vitamin D deficiency Vitamin D 50,000 IU once weekly x12 weeks sent to his pharmacy.  5. Prediabetes Recheck labs  Patient and his wife know to reach out if they have any further concerns that I can help with.  For now, plan on recheck as needed as it is very difficult for him to get out of the house.   This note was prepared with assistance of Systems analyst. Occasional wrong-word or sound-a-like substitutions may have occurred due to the inherent limitations of voice recognition software.  Time Spent: 51 minutes of total time was spent on the date of the encounter performing the following actions: chart review prior to seeing the patient, obtaining history, performing a medically necessary exam, counseling on the treatment plan, placing orders, and documenting in our EHR.  Latajah Thuman M Sarrah Fiorenza, PA-C

## 2022-06-03 ENCOUNTER — Telehealth: Payer: Self-pay | Admitting: Physician Assistant

## 2022-06-03 NOTE — Telephone Encounter (Signed)
In provider box waiting on signatures and to be faxed to [facility]

## 2022-06-03 NOTE — Telephone Encounter (Signed)
.  Home Health Certification or Plan of Care Tracking  Is this a Certification or Plan of Care? yes  [facility] Agency: [place]  Order Number:  [id number]  Has charge sheet been attached? yes  Where has form been placed:  In provider's box  Faxed to:   [phone]

## 2022-06-03 NOTE — Telephone Encounter (Signed)
Spouse has called in on behalf of patient.  States that [facility] had offered to prescribe trazodone.   States patient would like a script to [facility] to [facility] [place].

## 2022-06-04 ENCOUNTER — Other Ambulatory Visit: Payer: Self-pay

## 2022-06-04 MED ORDER — TRAZODONE HCL 50 MG PO TABS: 50.0000 mg | ORAL_TABLET | Freq: Every day | ORAL | 1 refills | Status: DC

## 2022-06-04 NOTE — Telephone Encounter (Signed)
Called pt to verify the pharmacy he wanted Rx sent to, pt advised [facility] and sent Rx to pharmacy per provider request.

## 2022-06-04 NOTE — Telephone Encounter (Signed)
See note and advise if you would like me to send in for patient

## 2022-06-04 NOTE — Telephone Encounter (Signed)
Returned pt call and advised Trazodone being used as a sleep aid and per Alyssa recommendations, she would like him to take it nightly, there are no real side effects if taking intermittently however would like him to take nightly for at least 1-2 weeks and then see how he's doing. Pt verbalized understanding.

## 2022-06-04 NOTE — Telephone Encounter (Signed)
ERROR message Sent to IT for correction and elimination from patients chart-

## 2022-06-04 NOTE — Telephone Encounter (Signed)
Error- please disregard

## 2022-06-04 NOTE — Telephone Encounter (Signed)
Patient called in regards to traZODone (DESYREL) 50 MG tablet. States he has some questions such as if the medication is meant to be used as a sleep aid or an antidepressant. He would also like to know if the sleep aid would be a different prescription. He can be reached at [phone].

## 2022-06-05 ENCOUNTER — Ambulatory Visit
Admission: RE | Admit: 2022-06-05 | Discharge: 2022-06-05 | Disposition: A | Discharge status: Home / Self Care | Payer: Medicare Other | Source: Ambulatory Visit | Attending: Radiation Oncology | Admitting: Radiation Oncology

## 2022-06-05 DIAGNOSIS — Z7401 Bed confinement status: Secondary | ICD-10-CM

## 2022-06-05 DIAGNOSIS — I69351 Hemiplegia and hemiparesis following cerebral infarction affecting right dominant side: Secondary | ICD-10-CM | POA: Diagnosis not present

## 2022-06-05 DIAGNOSIS — Z7984 Long term (current) use of oral hypoglycemic drugs: Secondary | ICD-10-CM

## 2022-06-05 DIAGNOSIS — D329 Benign neoplasm of meninges, unspecified: Secondary | ICD-10-CM

## 2022-06-05 DIAGNOSIS — K8020 Calculus of gallbladder without cholecystitis without obstruction: Secondary | ICD-10-CM

## 2022-06-05 DIAGNOSIS — Z9181 History of falling: Secondary | ICD-10-CM

## 2022-06-05 DIAGNOSIS — H3530 Unspecified macular degeneration: Secondary | ICD-10-CM

## 2022-06-05 DIAGNOSIS — D420 Neoplasm of uncertain behavior of cerebral meninges: Secondary | ICD-10-CM | POA: Diagnosis not present

## 2022-06-05 DIAGNOSIS — M159 Polyosteoarthritis, unspecified: Secondary | ICD-10-CM

## 2022-06-05 DIAGNOSIS — S300XXD Contusion of lower back and pelvis, subsequent encounter: Secondary | ICD-10-CM

## 2022-06-05 DIAGNOSIS — G20 Parkinson's disease: Secondary | ICD-10-CM

## 2022-06-05 DIAGNOSIS — K219 Gastro-esophageal reflux disease without esophagitis: Secondary | ICD-10-CM

## 2022-06-05 DIAGNOSIS — G40009 Localization-related (focal) (partial) idiopathic epilepsy and epileptic syndromes with seizures of localized onset, not intractable, without status epilepticus: Secondary | ICD-10-CM | POA: Diagnosis not present

## 2022-06-05 DIAGNOSIS — Z7901 Long term (current) use of anticoagulants: Secondary | ICD-10-CM

## 2022-06-05 DIAGNOSIS — R339 Retention of urine, unspecified: Secondary | ICD-10-CM

## 2022-06-05 DIAGNOSIS — D696 Thrombocytopenia, unspecified: Secondary | ICD-10-CM

## 2022-06-05 DIAGNOSIS — I69320 Aphasia following cerebral infarction: Secondary | ICD-10-CM | POA: Diagnosis not present

## 2022-06-05 MED ORDER — GADOBENATE DIMEGLUMINE 529 MG/ML IV SOLN
15.0000 mL | Freq: Once | INTRAVENOUS | Status: AC | PRN
  Administered 2022-06-05: 15 mL via INTRAVENOUS

## 2022-06-05 NOTE — Telephone Encounter (Signed)
Faxed orders back to [facility] signed by provider and took charge sheet and forms to Tammy in front office. Copy made to be scanned in to the chart.

## 2022-06-09 NOTE — Progress Notes (Signed)
Location/Histology of Brain Tumor:  Recurrent meningioma of brain  Patient presented with symptoms of:   MRI Brain w/ & w/o Contrast  06/05/2022 --IMPRESSION: Increased size of tumor in the midline and right parietal scalp with involvement of the right parietal calvarium since 05/16/2022. Unchanged meningioma overlying the left frontoparietal convexity, and unchanged enhancing tumor along the falx.  Past or anticipated interventions, if any, per medical oncology:  05/26/2022 --Dr. Cecil Cobbs [name]'s case was reviewed carefully in brain/spine tumor board this morning.   His prior RT treatment plans were fused with his current MRI brain.   The regions of progression within the falx and convexity soft tissue were not within [facility] field, and were outside of high dose IMRT field.   We did recommend moving forward with salvage fractionated SRS, over chemotherapy protocol discussed.  He is agreeable with this. No further avastin recommended at this time. He is also agreeable to meet with palliative care. Follow Up Instructions: Pending radiation treatment plan  Dose of Decadron, if applicable: {:[id number]}  Recent neurologic symptoms, if any:  Seizures: *** Headaches: *** Nausea: *** Dizziness/ataxia: *** Difficulty with hand coordination: *** Focal numbness/weakness: *** Visual deficits/changes: *** Confusion/Memory deficits: ***  SAFETY ISSUES: Prior radiation? Yes 12/13/2021 through 12/26/2021 Site Technique Total Dose (Gy) Dose per Fx (Gy) Completed Fx Beam Energies  Brain: Brain_salvage IMRT 30/30 6 5/5 6XFFF  12/09/2018-01/24/2019  Left sagittal Brain, 1.8 Gy x 31 fractions for a total dose of 55.8 Gy  Pacemaker/ICD? No Possible current pregnancy? N/A Is the patient on methotrexate? No  Additional Complaints / other details: ***

## 2022-06-09 NOTE — Progress Notes (Signed)
Radiation Oncology         [phone] ________________________________  Outpatient Re-Consultation  Name: James Olsen MRN: [id number]  Date: 06/10/2022  DOB: 1946/05/18  CC:Allwardt, Randa Evens, PA-C  [name], MD   REFERRING PHYSICIAN: [name], MD  DIAGNOSIS:  No diagnosis found.   Atypical meningioma of brain  HISTORY OF PRESENT ILLNESS::James Olsen is a 76 y.o. male who returns today for consideration of salvage fractionated SRS in management of his recurrent brain meningioma. I last met with the patient on 01/31/22 for post-RT follow-up.  In the interval, the patient presented to the ED on 02/11/22 following a focal seizure. Patient noted the seizure to last for approximately 1 minute and resolve on its own. Labs were unremarkable. Hospital course included a Keppra IV load and increasing his home keppra dose to 1039m BID. Head CT performed in the ED showed: similar size of the intra and extracranial mass/meningioma at the vertex since the most recent MRI from December; grossly stable size of left frontal convexity mass/meningioma; and encephalomalacia within the left frontal lobe and right cranial vertex.  MRI of the brain with and without contrast on 06/10/22 showed a increase in size of the tumor in the midline and right parietal scalp with involvement of the right parietal calvarium (compared to imaging on 05/16/2022). MRI showed no change in the meningioma overlying the left frontoparietal convexity or in the enhancing tumor along the falx.   During a follow up visit with Dr. VMickeal Skinneron 02/20/22, the patient denied any further seizures, but did endorse progressive right sided weakness over the past few weeks. The patient was also observed to be dragging his right leg more noticeably. Increased bulging of surgical hardware from his skull at the surgical site was also appreciated. Given these changes, Dr. VMickeal Skinnerordered an urgent MRI, and decreased decadron to  4 mg daily.   MRI of the brain without contrast on 02/26/22 showed a minimal decrease in size of nodular enhancement along the high right frontal convexity underlying the craniotomy site. Other areas of dural thickening/enhancement underlying the craniotomy also showed no substantial changed. MRI also showed an increased T2 FLAIR hyperintensity likely related to treatment, and a decrease in size of the posterior left frontal convexity meningioma.  During a follow up visit on 02/27/22, Dr. VMickeal Skinnerinformed the patient that the clear progression of inflammation in the left frontal lobe was due to repeat radiation exposure. Subsequently, Dr. VMickeal Skinnerinitiated the patient on a short term trial of high dose decadron (12 mg daily).   The patient had  minimal to no improvement in his right sided weakness, dizziness, or cognitive slowing since increasing the decadron to 126mdaily. Given lack of response to decadron, the patient began second line treatment with avastin on 03/11/22 - IV q3 weeks x4 cycles.  ***   PREVIOUS RADIATION THERAPY:   Radiation Treatment Dates: 12/13/2021 through 12/26/2021 Site Technique Total Dose (Gy) Dose per Fx (Gy) Completed Fx Beam Energies  Brain: Brain_salvage IMRT 30/30 6 5/5 6XFFF      Radiation treatment dates:   12/09/2018-01/24/2019 Site/dose:     Left sagittal Brain, 1.8 Gy x 31 fractions for a total dose of 55.8 Gy    PAST MEDICAL HISTORY:  has a past medical history of Allergy, Arthritis, Cataract, GERD (gastroesophageal reflux disease), Glaucoma, History of blood transfusion, History of radiation therapy (12/09/18- 01/24/2019), Hyperlipidemia, Macular degeneration, Seizures ([facility] St[facility] ([facility] and Wears glasses.    PAST SURGICAL  HISTORY: Past Surgical History:  Procedure Laterality Date   APPLICATION OF CRANIAL NAVIGATION N/A 06/21/2021   Procedure: APPLICATION OF CRANIAL NAVIGATION;  Surgeon: [name], MD;  Location: [facility];  Service: Neurosurgery;   Laterality: N/A;   BRAIN SURGERY     CHOLECYSTECTOMY  2008   lap choli   COLONOSCOPY     CRANIOPLASTY N/A 10/29/2018   Procedure: CRANIOPLASTY;  Surgeon: [name], MD;  Location: [facility];  Service: Neurosurgery;  Laterality: N/A;  CRANIOPLASTY   CRANIOTOMY N/A 08/18/2018   Procedure: CRANIOTOMY TUMOR EXCISION;  Surgeon: [name], MD;  Location: [facility];  Service: Neurosurgery;  Laterality: N/A;  CRANIOTOMY TUMOR EXCISION   CRANIOTOMY Bilateral 06/21/2021   Procedure: Bilateral Craniotomy for tumor resection;  Surgeon: [name], MD;  Location: [place] [facility];  Service: Neurosurgery;  Laterality: Bilateral;   DIAGNOSTIC LAPAROSCOPY     EYE SURGERY     laser surgery to relieve pressure   NAILBED REPAIR Left 05/04/2014   Procedure: LEFT INDEX NAIL ABLATION V-Y FLAP COVERAGE;  Surgeon: [name]., MD;  Location: [place];  Service: Orthopedics;  Laterality: Left;  index   PILONIDAL CYST EXCISION     x2   TONSILLECTOMY     VASECTOMY      FAMILY HISTORY: family history includes Congenital heart disease in his brother; Diabetes in his mother; Skin cancer in his brother.  SOCIAL HISTORY:  reports that he has never smoked. He has never used smokeless tobacco. He reports current alcohol use. He reports that he does not use drugs.  ALLERGIES: Patient has no known allergies.  MEDICATIONS:  Current Outpatient Medications  Medication Sig Dispense Refill   acetaminophen (TYLENOL) 500 MG tablet Take 1,000 mg by mouth daily as needed for moderate pain.     baclofen (LIORESAL) 10 MG tablet Take 1 tablet twice daily and 1 to 2 tablets at bedtime (Patient taking differently: Take 10 mg by mouth 4 (four) times daily.) 360 tablet 2   levETIRAcetam (KEPPRA) 500 MG tablet Take 2 tablets (1,000 mg total) by mouth 2 (two) times daily. 120 tablet 3   LORazepam (ATIVAN) 1 MG tablet Take 1 tablet 30 minutes before MRIs and/or radiation procedures. Take two tablets if absolutely  necessary for claustrophobia. Must have driver after taking medication. 16 tablet 0   magnesium gluconate (MAGONATE) 500 MG tablet Take 500 mg by mouth 2 (two) times daily. Lunch & Evening     Multiple Vitamins-Minerals (PRESERVISION AREDS 2) CAPS Take 1 capsule by mouth 2 (two) times daily.      silodosin (RAPAFLO) 8 MG CAPS capsule Take 8 mg by mouth daily with breakfast.     traZODone (DESYREL) 50 MG tablet Take 1 tablet (50 mg total) by mouth at bedtime. 30 tablet 1   Vitamin D, Ergocalciferol, (DRISDOL) 1.25 MG (50000 UNIT) CAPS capsule Take 1 capsule (50,000 Units total) by mouth every 7 (seven) days. 12 capsule 0   No current facility-administered medications for this encounter.    REVIEW OF SYSTEMS:  As above.   PHYSICAL EXAM:  vitals were not taken for this visit.   General: Alert and oriented, in no acute distress *** HEENT: Head is normocephalic. Extraocular movements are intact. Oropharynx is clear. Neck: Neck is supple, no palpable cervical or supraclavicular lymphadenopathy. Heart: Regular in rate and rhythm with no murmurs, rubs, or gallops. Chest: Clear to auscultation bilaterally, with no rhonchi, wheezes, or rales. Abdomen: Soft, nontender, nondistended, with no rigidity or  guarding. Extremities: No cyanosis or edema. Lymphatics: see Neck Exam Skin: No concerning lesions. Musculoskeletal: symmetric strength and muscle tone throughout. Neurologic: Cranial nerves II through XII are grossly intact. No obvious focalities. Speech is fluent. Coordination is intact. Psychiatric: Judgment and insight are intact. Affect is appropriate.   LABORATORY DATA:  Lab Results  Component Value Date   WBC 5.7 06/02/2022   HGB 13.9 06/02/2022   HCT 41.9 06/02/2022   MCV 94.7 06/02/2022   PLT 230.0 06/02/2022   CMP     Component Value Date/Time   NA 137 06/02/2022 1150   K 4.0 06/02/2022 1150   CL 101 06/02/2022 1150   CO2 28 06/02/2022 1150   GLUCOSE 189 (H) 06/02/2022 1150    BUN 17 06/02/2022 1150   CREATININE 0.66 06/02/2022 1150   CREATININE 0.84 12/03/2021 1228   CALCIUM 9.3 06/02/2022 1150   PROT 6.4 06/02/2022 1150   ALBUMIN 3.5 06/02/2022 1150   AST 19 06/02/2022 1150   ALT 38 06/02/2022 1150   ALKPHOS 92 06/02/2022 1150   BILITOT 0.3 06/02/2022 1150   GFRNONAA >60 04/02/2022 0532   GFRNONAA >60 12/03/2021 1228   GFRAA >60 11/26/2018 1231         RADIOGRAPHY: MR Brain W Wo Contrast  Result Date: 06/06/2022 CLINICAL DATA:  History of meningioma, pain and numbness down right side. History of meningioma resection in July 2022 and radiation treatment in January 2023. EXAM: MRI HEAD WITHOUT AND WITH CONTRAST TECHNIQUE: Multiplanar, multiecho pulse sequences of the brain and surrounding structures were obtained without and with intravenous contrast. CONTRAST:  37m MULTIHANCE GADOBENATE DIMEGLUMINE 529 MG/ML IV SOLN COMPARISON:  Brain MRI most recently 05/16/2022 FINDINGS: Brain: Postsurgical changes reflecting bilateral vertex craniotomies are again seen. Dural thickening and enhancement underlying the craniotomy at the vertex is unchanged, measuring up to 4 mm in maximal thickness (13-16). Enhancing tumor in the midline scalp overlying the craniotomy measuring up to 7 mm in thickness is slightly increased in size, previously measuring up to 0.5 cm in thickness (13-13). Additional enhancing tissue in the right parietal calvarium extending into the overlying scalp measuring up to 1.4 cm x 0.6 cm in the coronal plane is increased in size, previously measured 1.3 cm x 0.8 cm (13-13). Enhancing tumor along the falx measuring up to approximately 0.7 cm is not significantly changed, with no edema in the underlying brain parenchyma (11-126). The homogeneously enhancing extra-axial lesion overlying the left frontal lobe posterolaterally is unchanged, measuring up to 1.5 cm AP x 1.2 cm TV. There is no edema in the underlying brain parenchyma. The 9 mm thick extra-axial fluid  collection underlying the craniotomy site is unchanged. There is no evidence of acute intracranial hemorrhage, new/acute extra-axial fluid collection, or acute infarct. Confluent FLAIR signal abnormality in the frontoparietal white matter near the vertex, left more than right, is unchanged. Additional patchy FLAIR signal abnormality in the supratentorial white matter likely reflects a combination of posttreatment change and chronic small vessel disease. There is no midline shift. Vascular: Normal flow voids. Skull and upper cervical spine: Normal marrow signal. Sinuses/Orbits: The paranasal sinuses are clear. The globes and orbits are unremarkable Other: None. IMPRESSION: 1. Increased size of tumor in the midline and right parietal scalp with involvement of the right parietal calvarium since 05/16/2022. 2. Unchanged meningioma overlying the left frontoparietal convexity, and unchanged enhancing tumor along the falx. Electronically Signed   By: PValetta MoleM.D.   On: 06/06/2022 10:52   MR Brain W  Wo Contrast  Result Date: 05/18/2022 CLINICAL DATA:  Brain/CNS neoplasm, assess treatment response. History of grade 2 meningioma status post resection in 2019 followed by radiation therapy. Repeat craniotomy in 06/2021 and SRS in 12/2021. EXAM: MRI HEAD WITHOUT AND WITH CONTRAST TECHNIQUE: Multiplanar, multiecho pulse sequences of the brain and surrounding structures were obtained without and with intravenous contrast. CONTRAST:  3m GADAVIST GADOBUTROL 1 MMOL/ML IV SOLN COMPARISON:  Head CT 04/01/2022 and MRI 02/26/2022 FINDINGS: Brain: Postsurgical changes from craniotomies are again seen bilaterally at the vertex. Nodular enhancing extra-axial soft tissue immediately subjacent to the craniotomy at the vertex has not significantly changed, however there is progressive multifocal enhancing tumor involving the skull/craniotomy site and extending into the scalp, with the largest measuring 2.3 x 1.6 cm laterally on the  right (series 1100, image 250). Nodular enhancement along the falx has also increased, measuring 1.5 x 0.9 cm (series 9, image 28 and series 1100, image 233). Confluent T2 hyperintensity in the left greater than right frontoparietal white matter towards the vertex has not significantly changed. Patchy T2 hyperintensities elsewhere in the cerebral white matter bilaterally are stable to mildly increased and may reflect a combination of chronic small vessel ischemia and post treatment changes. A 1.8 x 1.2 cm meningioma laterally over the posterior left frontal convexity demonstrates increasingly heterogeneous enhancement compared to the prior MRI but has not significantly changed in size, and there is no significant associated edema. No acute infarct or acute intracranial hemorrhage is evident. There is mild to moderate cerebral atrophy. An extra-axial fluid collection at the craniotomy site is unchanged. Vascular: Major intracranial vascular flow voids are preserved. Skull and upper cervical spine: Postsurgical changes with tumor extending through the craniotomy/skull as noted above. Sinuses/Orbits: Unremarkable orbits. Trace mucosal thickening in the paranasal sinuses. Clear mastoid air cells. Other: None. IMPRESSION: 1. Progressive tumor involving the skull/craniotomy site with extracranial extension into the scalp. 2. Progressive small volume tumor along the falx. 3. Unchanged separate meningioma laterally over the left frontal convexity. Electronically Signed   By: ALogan BoresM.D.   On: 05/18/2022 11:57      IMPRESSION/PLAN: This is a very pleasant *** year old *** with metastatic disease to the brain.  I had a lengthy discussion with the patient after reviewing their MRI results with them.  We spoke about whole brain radiotherapy versus stereotactic radiosurgery to the brain. We spoke about the differing risks benefits and side effects of both of these treatments. During part of our discussion, we spoke  about the hair loss, fatigue and cognitive effects that can result from whole brain radiotherapy.  Additionally, we spoke about radionecrosis that can result from stereotactic radiosurgery. I explained that whole brain radiotherapy is more comprehensive and therefore can decrease the chance of recurrences elsewhere in the brain, while stereotactic radiosurgery only treats the areas of gross disease while sparing the rest of the brain parenchyma.  After lengthy discussion, the patient would like to proceed with stereotactic brain radiosurgery to their metastatic disease. They will meet with neurosurgery in the near future to discuss this further; a neurosurgeon will participate in their case.  CT simulation will take place on *** and treatment on ***.  I plan to deliver *** Gy in 1 fraction to ***.  On date of service, in total, I spent *** minutes on this encounter. Patient was seen in person.   __________________________________________   SEppie Gibson MD  This document serves as a record of services personally performed by SJudson Roch  Isidore Moos, MD. It was created on her behalf by Roney Mans, a trained medical scribe. The creation of this record is based on the scribe's personal observations and the provider's statements to them. This document has been checked and approved by the attending provider.

## 2022-06-10 ENCOUNTER — Inpatient Hospital Stay: Payer: Medicare Other

## 2022-06-10 ENCOUNTER — Ambulatory Visit: Payer: Medicare Other

## 2022-06-10 ENCOUNTER — Ambulatory Visit
Admission: RE | Admit: 2022-06-10 | Discharge: 2022-06-10 | Disposition: A | Discharge status: Home / Self Care | Payer: Medicare Other | Source: Ambulatory Visit | Attending: Radiation Oncology | Admitting: Radiation Oncology

## 2022-06-10 ENCOUNTER — Encounter: Payer: Self-pay | Admitting: Radiation Oncology

## 2022-06-10 ENCOUNTER — Inpatient Hospital Stay: Payer: Medicare Other | Admitting: Nurse Practitioner

## 2022-06-10 ENCOUNTER — Other Ambulatory Visit: Payer: Self-pay

## 2022-06-10 VITALS — BP 116/82 | HR 102 | Temp 97.5°F | Resp 18

## 2022-06-10 DIAGNOSIS — D320 Benign neoplasm of cerebral meninges: Secondary | ICD-10-CM | POA: Diagnosis present

## 2022-06-10 DIAGNOSIS — D420 Neoplasm of uncertain behavior of cerebral meninges: Secondary | ICD-10-CM

## 2022-06-10 DIAGNOSIS — Z510 Encounter for antineoplastic radiation therapy: Secondary | ICD-10-CM | POA: Insufficient documentation

## 2022-06-10 MED ORDER — SODIUM CHLORIDE 0.9% FLUSH
10.0000 mL | Freq: Once | INTRAVENOUS | Status: AC
  Administered 2022-06-10: 10 mL via INTRAVENOUS

## 2022-06-10 MED ORDER — SODIUM CHLORIDE 0.9% FLUSH: 10.0000 mL | Freq: Once | INTRAVENOUS | Status: AC

## 2022-06-10 NOTE — Progress Notes (Signed)
Has armband been applied?  Yes.    Does patient have an allergy to IV contrast dye?: No.   Has patient ever received premedication for IV contrast dye?: No.   Does patient take metformin?: No.  Date of lab work: June 02, 2022 BUN: 17 CR: 0.66 eGFR: 92.3  IV site: antecubital left, condition patent and no redness  Has IV site been added to flowsheet?  Yes.    BP 116/82 (BP Location: Left Arm, Patient Position: Sitting)   Pulse (!) 102   Temp (!) 97.5 F (36.4 C) (Oral)   Resp 18   SpO2 98%

## 2022-06-14 ENCOUNTER — Other Ambulatory Visit: Payer: Self-pay | Admitting: Physical Medicine & Rehabilitation

## 2022-06-16 ENCOUNTER — Ambulatory Visit: Payer: Medicare Other | Admitting: Radiation Oncology

## 2022-06-16 DIAGNOSIS — Z510 Encounter for antineoplastic radiation therapy: Secondary | ICD-10-CM | POA: Diagnosis not present

## 2022-06-17 ENCOUNTER — Other Ambulatory Visit: Payer: Self-pay

## 2022-06-17 ENCOUNTER — Inpatient Hospital Stay: Payer: Medicare Other

## 2022-06-17 ENCOUNTER — Ambulatory Visit
Admission: RE | Admit: 2022-06-17 | Discharge: 2022-06-17 | Disposition: A | Discharge status: Home / Self Care | Payer: Medicare Other | Source: Ambulatory Visit | Attending: Radiation Oncology | Admitting: Radiation Oncology

## 2022-06-17 DIAGNOSIS — D320 Benign neoplasm of cerebral meninges: Secondary | ICD-10-CM | POA: Diagnosis present

## 2022-06-17 DIAGNOSIS — Z510 Encounter for antineoplastic radiation therapy: Secondary | ICD-10-CM | POA: Diagnosis not present

## 2022-06-17 LAB — RAD ONC ARIA SESSION SUMMARY
Course Elapsed Days: 0
Plan Fractions Treated to Date: 1
Plan Prescribed Dose Per Fraction: 6 Gy
Plan Total Fractions Prescribed: 5
Plan Total Prescribed Dose: 30 Gy
Reference Point Dosage Given to Date: 6 Gy
Reference Point Session Dosage Given: 6 Gy
Session Number: 1

## 2022-06-18 ENCOUNTER — Ambulatory Visit: Payer: Medicare Other

## 2022-06-19 ENCOUNTER — Ambulatory Visit: Payer: Medicare Other

## 2022-06-20 ENCOUNTER — Ambulatory Visit: Payer: Medicare Other

## 2022-06-20 ENCOUNTER — Ambulatory Visit
Admission: RE | Admit: 2022-06-20 | Discharge: 2022-06-20 | Disposition: A | Discharge status: Home / Self Care | Payer: Medicare Other | Source: Ambulatory Visit | Attending: Radiation Oncology | Admitting: Radiation Oncology

## 2022-06-20 ENCOUNTER — Other Ambulatory Visit: Payer: Self-pay

## 2022-06-20 ENCOUNTER — Other Ambulatory Visit: Payer: Self-pay | Admitting: Physician Assistant

## 2022-06-20 ENCOUNTER — Inpatient Hospital Stay: Payer: Medicare Other

## 2022-06-20 ENCOUNTER — Inpatient Hospital Stay (HOSPITAL_BASED_OUTPATIENT_CLINIC_OR_DEPARTMENT_OTHER): Payer: Medicare Other | Admitting: Nurse Practitioner

## 2022-06-20 ENCOUNTER — Encounter: Payer: Self-pay | Admitting: Nurse Practitioner

## 2022-06-20 DIAGNOSIS — R531 Weakness: Secondary | ICD-10-CM

## 2022-06-20 DIAGNOSIS — Z510 Encounter for antineoplastic radiation therapy: Secondary | ICD-10-CM | POA: Diagnosis not present

## 2022-06-20 DIAGNOSIS — R530 Neoplastic (malignant) related fatigue: Secondary | ICD-10-CM | POA: Diagnosis not present

## 2022-06-20 DIAGNOSIS — D420 Neoplasm of uncertain behavior of cerebral meninges: Secondary | ICD-10-CM | POA: Diagnosis not present

## 2022-06-20 DIAGNOSIS — G893 Neoplasm related pain (acute) (chronic): Secondary | ICD-10-CM

## 2022-06-20 DIAGNOSIS — Z515 Encounter for palliative care: Secondary | ICD-10-CM | POA: Diagnosis not present

## 2022-06-20 DIAGNOSIS — D320 Benign neoplasm of cerebral meninges: Secondary | ICD-10-CM | POA: Diagnosis present

## 2022-06-20 DIAGNOSIS — Z7189 Other specified counseling: Secondary | ICD-10-CM

## 2022-06-20 LAB — RAD ONC ARIA SESSION SUMMARY
Course Elapsed Days: 3
Plan Fractions Treated to Date: 2
Plan Prescribed Dose Per Fraction: 6 Gy
Plan Total Fractions Prescribed: 5
Plan Total Prescribed Dose: 30 Gy
Reference Point Dosage Given to Date: 12 Gy
Reference Point Session Dosage Given: 6 Gy
Session Number: 2

## 2022-06-20 NOTE — Progress Notes (Unsigned)
[place]  [phone]) [phone] [phone]   Name: James Olsen Date: 06/20/2022 MRN: [id number]  DOB: [birth year]  Patient Care Team: Allwardt, Randa Evens, PA-C as PCP - General (Physician Assistant)    REASON FOR CONSULTATION: James Olsen is a 76 y.o. 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  Palliative ask to see for symptom management and goals of care.    SOCIAL HISTORY:     reports that he has never smoked. He has never used smokeless tobacco. He reports current alcohol use. He reports that he does not use drugs.  ADVANCE DIRECTIVES:  [name] has a documented advanced directive. His wife, James Olsen is his Scientist, research (medical).   CODE STATUS: Full code  PAST MEDICAL HISTORY: Past Medical History:  Diagnosis Date   Allergy    Arthritis    Cataract    GERD (gastroesophageal reflux disease)    Glaucoma    pt denies.    History of blood transfusion     5 units after previous surgery   History of radiation therapy 12/09/18- 01/24/2019   Left sagittal brain, 11.8 Gy in 31 fractions for a total dose of 55.8 Gy   Hyperlipidemia    Macular degeneration    Seizures ([place])    Stroke [facility])    after surgery in August 2019 mostly recovered still some weakness in left arm   Wears glasses     PAST SURGICAL HISTORY:  Past Surgical History:  Procedure Laterality Date   APPLICATION OF CRANIAL NAVIGATION N/A 06/21/2021   Procedure: APPLICATION OF CRANIAL NAVIGATION;  Surgeon: [name], MD;  Location: [place];  Service: Neurosurgery;  Laterality: N/A;   BRAIN SURGERY     CHOLECYSTECTOMY  2008   lap choli   COLONOSCOPY     CRANIOPLASTY N/A 10/29/2018   Procedure: CRANIOPLASTY;  Surgeon: [name], MD;  Location: [place];  Service: Neurosurgery;  Laterality: N/A;  CRANIOPLASTY   CRANIOTOMY N/A 08/18/2018   Procedure: CRANIOTOMY TUMOR EXCISION;   Surgeon: [name], MD;  Location: [place];  Service: Neurosurgery;  Laterality: N/A;  CRANIOTOMY TUMOR EXCISION   CRANIOTOMY Bilateral 06/21/2021   Procedure: Bilateral Craniotomy for tumor resection;  Surgeon: [name], MD;  Location: [place];  Service: Neurosurgery;  Laterality: Bilateral;   DIAGNOSTIC LAPAROSCOPY     EYE SURGERY     laser surgery to relieve pressure   NAILBED REPAIR Left 05/04/2014   Procedure: LEFT INDEX NAIL ABLATION V-Y FLAP COVERAGE;  Surgeon: [name]., MD;  Location: [place];  Service: Orthopedics;  Laterality: Left;  index   PILONIDAL CYST EXCISION     x2   TONSILLECTOMY     VASECTOMY      HEMATOLOGY/ONCOLOGY HISTORY:  Oncology History   No history exists.    ALLERGIES:  has No Known Allergies.  MEDICATIONS:  Current Outpatient Medications  Medication Sig Dispense Refill   acetaminophen (TYLENOL) 500 MG tablet Take 1,000 mg by mouth daily as needed for moderate pain.     baclofen (LIORESAL) 10 MG tablet TAKE 1 TABLET BY MOUTH THREE TIMES A DAY 90 tablet 1   levETIRAcetam (KEPPRA) 500 MG tablet Take 2 tablets (1,000 mg total) by mouth 2 (two) times daily. 120 tablet 3   LORazepam (ATIVAN) 1 MG tablet Take 1 tablet 30 minutes before MRIs and/or radiation procedures. Take two tablets if absolutely necessary for claustrophobia.  Must have driver after taking medication. 16 tablet 0   magnesium gluconate (MAGONATE) 500 MG tablet Take 500 mg by mouth 2 (two) times daily. Lunch & Evening     Multiple Vitamins-Minerals (PRESERVISION AREDS 2) CAPS Take 1 capsule by mouth 2 (two) times daily.      silodosin (RAPAFLO) 8 MG CAPS capsule Take 8 mg by mouth daily with breakfast.     traZODone (DESYREL) 50 MG tablet Take 1 tablet (50 mg total) by mouth at bedtime. 30 tablet 1   Vitamin D, Ergocalciferol, (DRISDOL) 1.25 MG (50000 UNIT) CAPS capsule Take 1 capsule (50,000 Units total) by mouth every 7 (seven) days. 12 capsule 0   No current  facility-administered medications for this visit.    VITAL SIGNS: There were no vitals taken for this visit. There were no vitals filed for this visit.  Estimated body mass index is 25.84 kg/m as calculated from the following:   Height as of 06/02/22: '5\' 9"'$  (1.753 m).   Weight as of 06/02/22: 79.4 kg.  LABS: CBC:    Component Value Date/Time   WBC 5.7 06/02/2022 1150   HGB 13.9 06/02/2022 1150   HGB 14.3 05/22/2022 1107   HCT 41.9 06/02/2022 1150   PLT 230.0 06/02/2022 1150   PLT 238 05/22/2022 1107   MCV 94.7 06/02/2022 1150   NEUTROABS 4.1 06/02/2022 1150   LYMPHSABS 1.0 06/02/2022 1150   MONOABS 0.5 06/02/2022 1150   EOSABS 0.1 06/02/2022 1150   BASOSABS 0.1 06/02/2022 1150   Comprehensive Metabolic Panel:    Component Value Date/Time   NA 137 06/02/2022 1150   K 4.0 06/02/2022 1150   CL 101 06/02/2022 1150   CO2 28 06/02/2022 1150   BUN 17 06/02/2022 1150   CREATININE 0.66 06/02/2022 1150   CREATININE 0.84 12/03/2021 1228   GLUCOSE 189 (H) 06/02/2022 1150   CALCIUM 9.3 06/02/2022 1150   AST 19 06/02/2022 1150   ALT 38 06/02/2022 1150   ALKPHOS 92 06/02/2022 1150   BILITOT 0.3 06/02/2022 1150   PROT 6.4 06/02/2022 1150   ALBUMIN 3.5 06/02/2022 1150    RADIOGRAPHIC STUDIES: MR Brain W Wo Contrast  Result Date: 06/06/2022 CLINICAL DATA:  History of meningioma, pain and numbness down right side. History of meningioma resection in July 2022 and radiation treatment in January 2023. EXAM: MRI HEAD WITHOUT AND WITH CONTRAST TECHNIQUE: Multiplanar, multiecho pulse sequences of the brain and surrounding structures were obtained without and with intravenous contrast. CONTRAST:  53m MULTIHANCE GADOBENATE DIMEGLUMINE 529 MG/ML IV SOLN COMPARISON:  Brain MRI most recently 05/16/2022 FINDINGS: Brain: Postsurgical changes reflecting bilateral vertex craniotomies are again seen. Dural thickening and enhancement underlying the craniotomy at the vertex is unchanged, measuring up to  4 mm in maximal thickness (13-16). Enhancing tumor in the midline scalp overlying the craniotomy measuring up to 7 mm in thickness is slightly increased in size, previously measuring up to 0.5 cm in thickness (13-13). Additional enhancing tissue in the right parietal calvarium extending into the overlying scalp measuring up to 1.4 cm x 0.6 cm in the coronal plane is increased in size, previously measured 1.3 cm x 0.8 cm (13-13). Enhancing tumor along the falx measuring up to approximately 0.7 cm is not significantly changed, with no edema in the underlying brain parenchyma (11-126). The homogeneously enhancing extra-axial lesion overlying the left frontal lobe posterolaterally is unchanged, measuring up to 1.5 cm AP x 1.2 cm TV. There is no edema in the underlying brain parenchyma. The 9  mm thick extra-axial fluid collection underlying the craniotomy site is unchanged. There is no evidence of acute intracranial hemorrhage, new/acute extra-axial fluid collection, or acute infarct. Confluent FLAIR signal abnormality in the frontoparietal white matter near the vertex, left more than right, is unchanged. Additional patchy FLAIR signal abnormality in the supratentorial white matter likely reflects a combination of posttreatment change and chronic small vessel disease. There is no midline shift. Vascular: Normal flow voids. Skull and upper cervical spine: Normal marrow signal. Sinuses/Orbits: The paranasal sinuses are clear. The globes and orbits are unremarkable Other: None. IMPRESSION: 1. Increased size of tumor in the midline and right parietal scalp with involvement of the right parietal calvarium since 05/16/2022. 2. Unchanged meningioma overlying the left frontoparietal convexity, and unchanged enhancing tumor along the falx. Electronically Signed   By: Valetta Mole M.D.   On: 06/06/2022 10:52    PERFORMANCE STATUS (ECOG) : 4 - Bedbound  Review of Systems  Musculoskeletal:  Positive for arthralgias.  Skin:         Scalp lesions   Neurological:  Positive for weakness.  Unless otherwise noted, a complete review of systems is negative.  Physical Exam General: NAD, in wheelchair  Cardiovascular: regular rate and rhythm Pulmonary: clear ant fields Extremities: no edema, no joint deformities, weakness bilateral lower extremities, right upper extremity, amputated right middle finger  Skin: no rashes, scalp nodules  Neurological: Weakness but otherwise nonfocal  IMPRESSION: This is my initial visit with Mr. Seeman. His wife James Olsen is also present. Patient sitting upright in wheelchair. No acute distress noted. Alert and oriented. Able to engage appropriately in discussions with wife supporting.   I introduced myself, [place] RN, and Palliative's role in collaboration with the oncology team. Concept of Palliative Care was introduced as specialized medical care for people and their families living with serious illness.  It focuses on providing relief from the symptoms and stress of a serious illness.  The goal is to improve quality of life for both the patient and the family. Values and goals of care important to patient and family were attempted to be elicited.   [name] lives in with his wife of more than 2 years.  They have pets in the home.  He is retired.  Enjoys gardening and landscaping in addition to hiking.  Unfortunately unable to do these things due to his health conditions.  Does continue to spend time outside as his wife maintains their landscaping.  He has home health support throughout the week.  Home health aide is present 4 times per week to assist with ADLs.  Also has PT/OT/SLP.  Patient is completely bedbound.  He is able to assist with transfer using sliding board.  Indwelling Foley catheter.  Reports appetite is good.  Does have some insomnia however trazodone is effective allowing him to sleep.  Occasional headaches and discomfort at nodule area.  He is taking Tylenol as needed which he also  feels is effective.  Shares he has some anxiety when his wife leaves the home.  Denies pain, discomfort, constipation, diarrhea, nausea or vomiting. No symptom needs at this time.   Goals of Care  We discussed his current illness and what it means in the larger context of his on-going co-morbidities. Natural disease trajectory and expectations were discussed.  Can his wife are realistic in their understanding of his current illness and comorbidities.  Expressed wishes to continue to treat the treatable Every opportunity to continue to thrive.  Knowledgeable about current plan of  care and long-term prognosis.  I created space and opportunity for patient and wife to share their thoughts and feelings in regards to his health.  Emotional support provided.  I empathetically approach discussions regarding advanced directives, CODE STATUS, and healthcare wishes worst-case scenario.  Patient has a documented advanced directive.  I have personally reviewed documentation.  His wife James Olsen is his Education officer, community.  Patient has no desire for life-sustaining measures long-term including artificial feedings. Open to temporary options pending quality of life.    Education provided on MOLST form.  Document given to wife to allow time for her and patient to further review and discuss with plans on completing at follow-up visit as desired.  I discussed the importance of continued conversation with family and their medical providers regarding overall plan of care and treatment options, ensuring decisions are within the context of the patients values and GOCs.  PLAN: Establish therapeutic relationship.  Education provided on palliative role in collaboration with oncology/radiation team.   No symptom needs at this time.   Occasional headaches and discomfort.  Feels Tylenol extra strength is effective.   Patient has documented advanced directive naming his wife James Olsen as his primary Education officer, community.   Clear and expressed wishes to continue to treat the treatable allow him every opportunity to continue to thrive.  Patient would not wish to suffer if his health was to further decline.  No pro-longed life-sustaining measures. Education provided on MOLST form.  We will complete documentation at future visit per patient's wishes.   I will plan to see patient back in 1-2 weeks in collaboration to other oncology/radiation appointments.    Patient expressed understanding and was in agreement with this plan. He also understands that He can call the clinic at any time with any questions, concerns, or complaints.   Thank you for your referral and allowing Palliative to assist in Mr. Nathanuel Cabreja Sarr's care.   Number and complexity of problems addressed: 2 HIGH - 1 or more chronic illnesses with SEVERE exacerbation, progression, or side effects of treatment - advanced cancer, pain. Any controlled substances utilized were prescribed in the context of palliative care.  Time Total: 65 min  Visit consisted of counseling and education dealing with the complex and emotionally intense issues of symptom management and palliative care in the setting of serious and potentially life-threatening illness.Greater than 50%  of this time was spent counseling and coordinating care related to the above assessment and plan.  Signed by: Alda Lea, AGPCNP-BC Palliative Medicine Team/[place]

## 2022-06-20 NOTE — Progress Notes (Addendum)
Nurse monitoring complete status post 2 of 5 SRS treatments. Patient without complaint. Patient denies new or worsening neurologic symptoms. Vitals stable. Instructed patient to avoid strenuous activity for the next 24 hours. Instructed patient and his wife to call [phone] with needs related to treatment after hours or over the weekend. Patient and wife verbalized understanding. Patient escorted out of clinic in wheelchair by wife without incident   Vitals:   06/20/22 1225  BP: 119/76  Pulse: 86  Resp: 17  Temp: 97.7 F (36.5 C)  SpO2: 100%   Patient also seen and evaluated by Palliative Care team before leaving clinic

## 2022-06-23 ENCOUNTER — Inpatient Hospital Stay: Payer: Medicare Other | Attending: Internal Medicine

## 2022-06-23 ENCOUNTER — Other Ambulatory Visit: Payer: Self-pay

## 2022-06-23 ENCOUNTER — Ambulatory Visit: Payer: Medicare Other

## 2022-06-23 ENCOUNTER — Ambulatory Visit
Admission: RE | Admit: 2022-06-23 | Discharge: 2022-06-23 | Disposition: A | Discharge status: Home / Self Care | Payer: Medicare Other | Source: Ambulatory Visit | Attending: Radiation Oncology | Admitting: Radiation Oncology

## 2022-06-23 ENCOUNTER — Ambulatory Visit: Admission: RE | Admit: 2022-06-23 | Payer: Medicare Other | Source: Ambulatory Visit

## 2022-06-23 DIAGNOSIS — Z515 Encounter for palliative care: Secondary | ICD-10-CM | POA: Insufficient documentation

## 2022-06-23 DIAGNOSIS — D499 Neoplasm of unspecified behavior of unspecified site: Secondary | ICD-10-CM | POA: Diagnosis present

## 2022-06-23 DIAGNOSIS — C713 Malignant neoplasm of parietal lobe: Secondary | ICD-10-CM | POA: Insufficient documentation

## 2022-06-23 LAB — RAD ONC ARIA SESSION SUMMARY
Course Elapsed Days: 6
Plan Fractions Treated to Date: 3
Plan Prescribed Dose Per Fraction: 6 Gy
Plan Total Fractions Prescribed: 5
Plan Total Prescribed Dose: 30 Gy
Reference Point Dosage Given to Date: 18 Gy
Reference Point Session Dosage Given: 6 Gy
Session Number: 3

## 2022-06-23 MED ORDER — SONAFINE EX EMUL
1.0000 | Freq: Two times a day (BID) | CUTANEOUS | Status: DC
  Administered 2022-06-23: 1 via TOPICAL

## 2022-06-25 ENCOUNTER — Inpatient Hospital Stay: Payer: Medicare Other

## 2022-06-25 ENCOUNTER — Ambulatory Visit
Admission: RE | Admit: 2022-06-25 | Discharge: 2022-06-25 | Disposition: A | Discharge status: Home / Self Care | Payer: Medicare Other | Source: Ambulatory Visit | Attending: Radiation Oncology | Admitting: Radiation Oncology

## 2022-06-25 ENCOUNTER — Inpatient Hospital Stay (HOSPITAL_BASED_OUTPATIENT_CLINIC_OR_DEPARTMENT_OTHER): Payer: Medicare Other | Admitting: Nurse Practitioner

## 2022-06-25 ENCOUNTER — Other Ambulatory Visit: Payer: Self-pay

## 2022-06-25 ENCOUNTER — Encounter: Payer: Self-pay | Admitting: Nurse Practitioner

## 2022-06-25 ENCOUNTER — Ambulatory Visit: Payer: Medicare Other

## 2022-06-25 ENCOUNTER — Encounter: Payer: Self-pay | Admitting: Internal Medicine

## 2022-06-25 VITALS — BP 110/82 | HR 96 | Temp 97.8°F | Resp 18

## 2022-06-25 DIAGNOSIS — R531 Weakness: Secondary | ICD-10-CM | POA: Diagnosis not present

## 2022-06-25 DIAGNOSIS — C713 Malignant neoplasm of parietal lobe: Secondary | ICD-10-CM | POA: Insufficient documentation

## 2022-06-25 DIAGNOSIS — Z515 Encounter for palliative care: Secondary | ICD-10-CM | POA: Diagnosis not present

## 2022-06-25 DIAGNOSIS — D420 Neoplasm of uncertain behavior of cerebral meninges: Secondary | ICD-10-CM

## 2022-06-25 DIAGNOSIS — Z7189 Other specified counseling: Secondary | ICD-10-CM | POA: Diagnosis not present

## 2022-06-25 LAB — RAD ONC ARIA SESSION SUMMARY
Course Elapsed Days: 8
Plan Fractions Treated to Date: 4
Plan Prescribed Dose Per Fraction: 6 Gy
Plan Total Fractions Prescribed: 5
Plan Total Prescribed Dose: 30 Gy
Reference Point Dosage Given to Date: 24 Gy
Reference Point Session Dosage Given: 6 Gy
Session Number: 4

## 2022-06-25 NOTE — Progress Notes (Signed)
[place]  Telephone:([phone] Fax:(336) [phone]   Name: James Olsen Date: 06/25/2022 MRN: [id number]  DOB: July 19, 1946  Patient Care Team: Allwardt, Randa Evens, PA-C as PCP - General (Physician Assistant) Pickenpack-Cousar, Carlena Sax, NP as Nurse Practitioner (Nurse Practitioner)    INTERVAL HISTORY: James Olsen is a 76 y.o. 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  Palliative ask to see for symptom management and goals of care.   SOCIAL HISTORY:     reports that he has never smoked. He has never used smokeless tobacco. He reports current alcohol use. He reports that he does not use drugs.  ADVANCE DIRECTIVES:  James Olsen has a documented advanced directive. His wife, James Olsen is his Scientist, research (medical).   CODE STATUS: Full code  PAST MEDICAL HISTORY: Past Medical History:  Diagnosis Date   Allergy    Arthritis    Cataract    GERD (gastroesophageal reflux disease)    Glaucoma    pt denies.    History of blood transfusion     5 units after previous surgery   History of radiation therapy 12/09/18- 01/24/2019   Left sagittal brain, 11.8 Gy in 31 fractions for a total dose of 55.8 Gy   Hyperlipidemia    Macular degeneration    Seizures ([place])    Stroke [facility])    after surgery in August 2019 mostly recovered still some weakness in left arm   Wears glasses     ALLERGIES:  has No Known Allergies.  MEDICATIONS:  Current Outpatient Medications  Medication Sig Dispense Refill   acetaminophen (TYLENOL) 500 MG tablet Take 1,000 mg by mouth daily as needed for moderate pain.     baclofen (LIORESAL) 10 MG tablet TAKE 1 TABLET BY MOUTH THREE TIMES A DAY 90 tablet 1   levETIRAcetam (KEPPRA) 500 MG tablet Take 2 tablets (1,000 mg total) by mouth 2 (two) times daily. 120 tablet 3   LORazepam (ATIVAN) 1 MG tablet Take 1 tablet 30 minutes before MRIs and/or radiation procedures.  Take two tablets if absolutely necessary for claustrophobia. Must have driver after taking medication. 16 tablet 0   magnesium gluconate (MAGONATE) 500 MG tablet Take 500 mg by mouth 2 (two) times daily. Lunch & Evening     Multiple Vitamins-Minerals (PRESERVISION AREDS 2) CAPS Take 1 capsule by mouth 2 (two) times daily.      silodosin (RAPAFLO) 8 MG CAPS capsule Take 8 mg by mouth daily with breakfast.     traZODone (DESYREL) 50 MG tablet Take 1 tablet (50 mg total) by mouth at bedtime. 30 tablet 1   Vitamin D, Ergocalciferol, (DRISDOL) 1.25 MG (50000 UNIT) CAPS capsule Take 1 capsule (50,000 Units total) by mouth every 7 (seven) days. 12 capsule 0   No current facility-administered medications for this visit.    VITAL SIGNS: BP 110/82 (BP Location: Left Arm, Patient Position: Sitting)   Pulse 96   Temp 97.8 F (36.6 C) (Tympanic)   Resp 18   SpO2 98%  There were no vitals filed for this visit.  Estimated body mass index is 25.84 kg/m as calculated from the following:   Height as of 06/02/22: '5\' 9"'$  (1.753 m).   Weight as of 06/02/22: 79.4 kg.   PERFORMANCE STATUS (ECOG) : 3 - Symptomatic, >50% confined to bed   Physical Exam General: NAD Cardiovascular: regular rate and rhythm Pulmonary: normal breathing pattern  Extremities: no edema, no  joint deformities Skin: no rashes, scalp nodules  Neurological: Weakness but otherwise nonfocal  IMPRESSION: James Olsen and his wife present to clinic today for ongoing support. No acute distress noted. Sitting upright in wheelchair.   He now has caregivers in the home 6 days a week which they are appreciative of. They are utilizing different companies to allow for coverage at all times and as back up in case staff are unavailable.   Ongoing goals of care discussions. James Olsen states in the event of an emergency his wife would make appropriate decisions for him based on situation. He is open to receiving care however would not want long-term life  sustaining measures.    As requested we completed a MOST form today. The patient and family outlined their wishes for the following treatment decisions:  Cardiopulmonary Resuscitation: Attempt Resuscitation (CPR)  Medical Interventions: Limited Additional Interventions: Use medical treatment, IV fluids and cardiac monitoring as indicated, DO NOT USE intubation or mechanical ventilation. May consider use of less invasive airway support such as BiPAP or CPAP. Also provide comfort measures. Transfer to the hospital if indicated. Avoid intensive care.   Antibiotics: Determine use of limitation of antibiotics when infection occurs  IV Fluids: IV fluids for a defined trial period  Feeding Tube: Feeding tube for a defined trial period    I discussed the importance of continued conversation with family and their medical providers regarding overall plan of care and treatment options, ensuring decisions are within the context of the patients values and GOCs.  PLAN: MOST form completed today. Original given to patient and copy scanned to chart.  Encouraged ongoing goals of care discussions No symptom needs at this time Will continue support as needed Will see patient back in office in 3-4 weeks in collaboration with other Oncology/Radiation appointments.    Patient expressed understanding and was in agreement with this plan. He also understands that He can call the clinic at any time with any questions, concerns, or complaints.     Time Total: 30 min   Visit consisted of counseling and education dealing with the complex and emotionally intense issues of symptom management and palliative care in the setting of serious and potentially life-threatening illness.Greater than 50%  of this time was spent counseling and coordinating care related to the above assessment and plan.  Signed by: Alda Lea, AGPCNP-BC [place]

## 2022-06-26 ENCOUNTER — Other Ambulatory Visit: Payer: Self-pay | Admitting: Physician Assistant

## 2022-06-26 ENCOUNTER — Ambulatory Visit: Payer: Medicare Other

## 2022-06-27 ENCOUNTER — Other Ambulatory Visit: Payer: Self-pay | Admitting: Radiation Therapy

## 2022-06-27 ENCOUNTER — Other Ambulatory Visit: Payer: Self-pay

## 2022-06-27 ENCOUNTER — Ambulatory Visit: Payer: Medicare Other

## 2022-06-27 ENCOUNTER — Ambulatory Visit
Admission: RE | Admit: 2022-06-27 | Discharge: 2022-06-27 | Disposition: A | Discharge status: Home / Self Care | Payer: Medicare Other | Source: Ambulatory Visit | Attending: Radiation Oncology | Admitting: Radiation Oncology

## 2022-06-27 ENCOUNTER — Encounter: Payer: Self-pay | Admitting: Radiation Oncology

## 2022-06-27 ENCOUNTER — Inpatient Hospital Stay: Payer: Medicare Other

## 2022-06-27 DIAGNOSIS — D499 Neoplasm of unspecified behavior of unspecified site: Secondary | ICD-10-CM | POA: Diagnosis not present

## 2022-06-27 LAB — RAD ONC ARIA SESSION SUMMARY
Course Elapsed Days: 10
Plan Fractions Treated to Date: 5
Plan Prescribed Dose Per Fraction: 6 Gy
Plan Total Fractions Prescribed: 5
Plan Total Prescribed Dose: 30 Gy
Reference Point Dosage Given to Date: 30 Gy
Reference Point Session Dosage Given: 6 Gy
Session Number: 5

## 2022-06-30 ENCOUNTER — Ambulatory Visit: Payer: Medicare Other

## 2022-07-01 ENCOUNTER — Ambulatory Visit: Payer: Medicare Other

## 2022-07-02 ENCOUNTER — Ambulatory Visit: Payer: Medicare Other

## 2022-07-03 ENCOUNTER — Ambulatory Visit: Payer: Medicare Other

## 2022-07-04 ENCOUNTER — Ambulatory Visit: Payer: Medicare Other

## 2022-07-07 ENCOUNTER — Encounter: Payer: Self-pay | Admitting: Physician Assistant

## 2022-07-07 ENCOUNTER — Ambulatory Visit: Payer: Medicare Other

## 2022-07-07 NOTE — Telephone Encounter (Signed)
Please advise if you would like me to call patient to schedule an in office visit?

## 2022-07-07 NOTE — Telephone Encounter (Signed)
Virtual visit scheduled.  

## 2022-07-08 ENCOUNTER — Ambulatory Visit: Payer: Medicare Other

## 2022-07-08 ENCOUNTER — Encounter: Payer: Self-pay | Admitting: Physician Assistant

## 2022-07-08 ENCOUNTER — Telehealth (INDEPENDENT_AMBULATORY_CARE_PROVIDER_SITE_OTHER): Payer: Medicare Other | Admitting: Physician Assistant

## 2022-07-08 DIAGNOSIS — B353 Tinea pedis: Secondary | ICD-10-CM | POA: Diagnosis not present

## 2022-07-08 DIAGNOSIS — B351 Tinea unguium: Secondary | ICD-10-CM | POA: Diagnosis not present

## 2022-07-08 NOTE — Progress Notes (Signed)
   Virtual Visit via Video Note  I connected with  James Olsen  on 07/08/22 at 11:30 AM EDT by a video enabled telemedicine application and verified that I am speaking with the correct person using two identifiers.  Location: Patient: home Provider: Therapist, music at [place] present: Patient, patient's wife, and myself   I discussed the limitations of evaluation and management by telemedicine and the availability of in person appointments. The patient expressed understanding and agreed to proceed.   History of Present Illness:  76 yo male presents for virtual visit to discuss ongoing toenail fungus. He is currently bedbound. Treating 3x daily with alcohol and polysporin. Using Clotrimazole as well. In between toes Left foot, more impacted than on R foot. Discolored in toenails.  R little toenail came off the other day. Left foot, third nail is somewhat loose.  Travel podiatrist coming in on Thursday.   Denies any itching. No stinging or burning. No pain in legs or feet.  PT about 30 minutes ago, warm extremities per patient and wife. Ankle to toes on R foot slightly cool No swelling    Observations/Objective:   Gen: Awake, alert, no acute distress Resp: Breathing is even and non-labored Psych: calm/pleasant demeanor Neuro: Alert and Oriented x 3, + facial symmetry, speech is clear. Camera disconnected while trying to see patient's feet and toes.    Assessment and Plan:  1. Onychomycosis 2. Tinea pedis of both feet Video connection was lost at >50% of the duration of the visit, at which time the remainder of the visit was completed via audio only.  -Discussed risks vs benefits of oral anti-fungals -Given need to monitor LFT's with treatment and difficulty of him getting out of the house, not sure risk is worth benefit at this time -Would cont clotrimazole cream; keep nails trimmed, air dry as much as possible  -F/up with podiatrist  coming out in a few days  -pt and wife agreeable and understanding   Follow Up Instructions:    I discussed the assessment and treatment plan with the patient. The patient was provided an opportunity to ask questions and all were answered. The patient agreed with the plan and demonstrated an understanding of the instructions.   The patient was advised to call back or seek an in-person evaluation if the symptoms worsen or if the condition fails to improve as anticipated.  Total time spent on phone was 11 minutes.   Alwilda Gilland M Tyri Elmore, PA-C

## 2022-07-09 ENCOUNTER — Ambulatory Visit: Payer: Medicare Other

## 2022-07-09 ENCOUNTER — Encounter: Payer: Medicare Other | Attending: Physical Medicine & Rehabilitation | Admitting: Physical Medicine & Rehabilitation

## 2022-07-09 ENCOUNTER — Encounter: Payer: Self-pay | Admitting: Physical Medicine & Rehabilitation

## 2022-07-09 VITALS — BP 114/76 | HR 85 | Ht 69.0 in | Wt 187.0 lb

## 2022-07-09 DIAGNOSIS — D420 Neoplasm of uncertain behavior of cerebral meninges: Secondary | ICD-10-CM | POA: Insufficient documentation

## 2022-07-09 DIAGNOSIS — I69351 Hemiplegia and hemiparesis following cerebral infarction affecting right dominant side: Secondary | ICD-10-CM | POA: Diagnosis present

## 2022-07-09 MED ORDER — BACLOFEN 10 MG PO TABS: ORAL_TABLET | ORAL | 4 refills | Status: DC

## 2022-07-09 NOTE — Patient Instructions (Signed)
SIMPLE HAMSTRING STRETCH:  SITTING UP WITH LEGS STRAIGHT AS POSSIBLE, TRY TO TOUCH YOUR TOES. YOU SHOULD FEEL A PULL ON THE BACK OF YOUR THIGHS.

## 2022-07-09 NOTE — Progress Notes (Signed)
Subjective:    Patient ID: James Olsen, male    DOB: [birth year], 76 y.o.   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  He has seen neuro oncology and he has undergone further radiation therapy.  Regions of progression were noted that were not within the prior treatment field.   Since May he has been bed bound d/t progression of his tumor. He has been receiving PT and OT. They have also been paying private care givers 5 hours per day to help with his personal needs and for exercises.  He has had worsening weakness and sensory loss of his right side. He reports a lot hamstring spasms at night. They seem to coincide with the days he's doing more standing exercises (which he's done more of lately). He also notices them when he transfers in bed.   He continues with a foley catheter d/t urinary retention.    Pain Inventory Average Pain 0 Pain Right Now 0 My pain is  no pain  LOCATION OF PAIN  no pain  BOWEL Number of stools per week: 14-21 Oral laxative use No  Type of laxative . Enema or suppository use No  History of colostomy No  Incontinent No   BLADDER Foley In and out cath, frequency . Able to self cath  . Bladder incontinence No  Frequent urination No  Leakage with coughing No  Difficulty starting stream No  Incomplete bladder emptying No    Mobility how many minutes can you walk? 0 ability to climb steps?  no do you drive?  no use a wheelchair needs help with transfers  Function retired I need assistance with the following:  dressing, bathing, toileting, meal prep, household duties, and shopping  Neuro/Psych weakness numbness tremor tingling trouble walking spasms anxiety  Prior Studies Any changes since last visit?  no  Physicians involved in your care Any changes since last visit?  no   Family History  Problem Relation Age of Onset   Diabetes Mother    Skin cancer Brother    Congenital  heart disease Brother    Social History   Socioeconomic History   Marital status: Married    Spouse name: Not on file   Number of children: Not on file   Years of education: Not on file   Highest education level: Not on file  Occupational History   Not on file  Tobacco Use   Smoking status: Never   Smokeless tobacco: Never  Vaping Use   Vaping Use: Never used  Substance and Sexual Activity   Alcohol use: Yes    Comment: occasionally   Drug use: No   Sexual activity: Not Currently  Other Topics Concern   Not on file  Social History Narrative   Not on file   Social Determinants of Health   Financial Resource Strain: Not on file  Food Insecurity: Not on file  Transportation Needs: No Transportation Needs (11/10/2018)   PRAPARE - Hydrologist (Medical): No    Lack of Transportation (Non-Medical): No  Physical Activity: Not on file  Stress: Not on file  Social Connections: Not on file   Past Surgical History:  Procedure Laterality Date   APPLICATION OF CRANIAL NAVIGATION N/A 06/21/2021   Procedure: APPLICATION OF CRANIAL NAVIGATION;  Surgeon: [name], MD;  Location: [place];  Service: Neurosurgery;  Laterality: N/A;   BRAIN SURGERY     CHOLECYSTECTOMY  2008   lap choli   COLONOSCOPY     CRANIOPLASTY N/A 10/29/2018   Procedure: CRANIOPLASTY;  Surgeon: [name], MD;  Location: [place];  Service: Neurosurgery;  Laterality: N/A;  CRANIOPLASTY   CRANIOTOMY N/A 08/18/2018   Procedure: CRANIOTOMY TUMOR EXCISION;  Surgeon: [name], MD;  Location: [employer];  Service: Neurosurgery;  Laterality: N/A;  CRANIOTOMY TUMOR EXCISION   CRANIOTOMY Bilateral 06/21/2021   Procedure: Bilateral Craniotomy for tumor resection;  Surgeon: [name], MD;  Location: [place];  Service: Neurosurgery;  Laterality: Bilateral;   DIAGNOSTIC LAPAROSCOPY     EYE SURGERY     laser surgery to relieve pressure   NAILBED REPAIR Left 05/04/2014   Procedure: LEFT INDEX  NAIL ABLATION V-Y FLAP COVERAGE;  Surgeon: [name]., MD;  Location: [facility];  Service: Orthopedics;  Laterality: Left;  index   PILONIDAL CYST EXCISION     x2   TONSILLECTOMY     VASECTOMY     Past Medical History:  Diagnosis Date   Allergy    Arthritis    Cataract    GERD (gastroesophageal reflux disease)    Glaucoma    pt denies.    History of blood transfusion     5 units after previous surgery   History of radiation therapy 12/09/18- 01/24/2019   Left sagittal brain, 11.8 Gy in 31 fractions for a total dose of 55.8 Gy   Hyperlipidemia    Macular degeneration    Seizures ([place])    Stroke [facility])    after surgery in August 2019 mostly recovered still some weakness in left arm   Wears glasses    BP 114/76   Pulse 85   Ht '5\' 9"'$  (1.753 m)   Wt 187 lb (84.8 kg)   SpO2 98%   BMI 27.62 kg/m   Opioid Risk Score:   Fall Risk Score:  `1  Depression screen [facility] 2/9     07/09/2022   11:28 AM 12/11/2021   10:30 AM 09/11/2021    1:12 PM 09/13/2018    1:34 PM  Depression screen PHQ 2/9  Decreased Interest 0 0 0 0  Down, Depressed, Hopeless 0 0 0 0  PHQ - 2 Score 0 0 0 0  Altered sleeping   0 1  Tired, decreased energy   0 0  Change in appetite   0 0  Feeling bad or failure about yourself    0 0  Trouble concentrating   0 0  Moving slowly or fidgety/restless   0 0  Suicidal thoughts   0 0  PHQ-9 Score   0 1  Difficult doing work/chores    Not difficult at all      Review of Systems  Neurological:  Positive for weakness and numbness.  Psychiatric/Behavioral:  The patient is nervous/anxious.   All other systems reviewed and are negative.      Objective:   Physical Exam  General: No acute distress HEENT: NCAT, EOMI, oral membranes moist, visible growths on scalp/skull Cards: reg rate  Chest: normal effort Abdomen: Soft, NT, ND Skin: dry, flaky. I Extremities: no edema Psych: pleasant and appropriate  Neuro: Patient is alert and  oriented x3.  he demonstrates increased difficulty with word finding and processing. Marland Kitchen  Upper extremity strength is 4 out of 5 and grossly left and 3/5 on right..  Right lower extremity is 1-2 out of 5 with hip flexion and knee extension.  Hamstrings are 1-2 out  of 5.  He is tr-5 plantarflexion and absent ankle dorsiflexion.  Left lower extremity is2+ to 3- out of 5 proximally to 1-2/5 distally.   Sensory exam slightly diminished on right to PP and LT. Marland Kitchen   He is hyperreflexic 3+ on the right 2+ on the left lower extremities. Occasional tremor on left. No clonus seen Musculoskeletal: R>L hamstring tightness and tight heel cords.              Assessment & Plan:  Functional deficits secondary to atypical meningioma and subsequent craniotomy with associated spastic weakness  -pt has experienced a significant neurological decline since I last saw him in December -continue neuro-onc/rad-onc f/u as directed -I discussed functional goals with patient. These should include maximizing standing tolerance and transfer abilities, daily stretches of heel cords and hamstrings Pain management: fairly controlled at present Neurogenic bowel and bladder: continue with foley for now -foley is being changed monthly  Spasticity: -increased baclofen to '10mg'$  tid and 1-2 at night to help with PM/early AM spasms  5.  Seizures.  keppra   25 minutes of face to face patient care time were spent during this visit. All questions were encouraged and answered. Follow up with me in 6 mos.

## 2022-07-10 ENCOUNTER — Ambulatory Visit: Payer: Medicare Other

## 2022-07-11 ENCOUNTER — Ambulatory Visit: Payer: Medicare Other

## 2022-07-13 ENCOUNTER — Other Ambulatory Visit: Payer: Self-pay | Admitting: Physical Medicine & Rehabilitation

## 2022-07-13 DIAGNOSIS — I69351 Hemiplegia and hemiparesis following cerebral infarction affecting right dominant side: Secondary | ICD-10-CM

## 2022-07-13 DIAGNOSIS — D420 Neoplasm of uncertain behavior of cerebral meninges: Secondary | ICD-10-CM

## 2022-07-14 ENCOUNTER — Ambulatory Visit: Payer: Medicare Other

## 2022-07-14 ENCOUNTER — Telehealth: Payer: Self-pay

## 2022-07-14 NOTE — Telephone Encounter (Signed)
Pt wife called regarding upcoming appt, LVM, when RN attempted to call back pt answered and wife was unavailable, pt reported that he and his wife will call back later this week.

## 2022-07-15 ENCOUNTER — Ambulatory Visit: Payer: Medicare Other

## 2022-07-16 ENCOUNTER — Ambulatory Visit: Payer: Medicare Other

## 2022-07-17 ENCOUNTER — Telehealth: Payer: Self-pay

## 2022-07-17 NOTE — Telephone Encounter (Signed)
Pt wife, judi, returned call. Judi asked about caregiver resources, monthly alight pamphlet mailed to home address. Pt and wife also state that they wish to cancel palliative appt in august, verbalize agreement to call with any questions or concerns.

## 2022-07-18 NOTE — Op Note (Signed)
James Olsen was brought to the stereotactic radiation treatment machine and placed supine on the treatment couch. The patient was set-up for stereotactic radiotherapy. The patient's radiation plan was reviewed and approved prior to starting treatment. It showed 3-dimensional radiation dose distributions overlaid on the treatment planning CT. The DVHs for the target structures as well as the organs at risk were reviewed. The documentation of this is filed in the radiation oncology EMR. Simulation Verification: The patient underwent CT imaging on the treatment unit. These were carefully aligned to document that the ablative radiation dose would cover the target volume and maximally spare the nearby organs at risk according to the planned distribution. Narrative: James Olsen received high dose ablative stereotactic radiotherapy to the planned target volume without unforseen complications. Treatment was delivered uneventfully. The imaging and treatment were supervised by the radiation oncologist Eppie Gibson, M.D. and the medical physicist Arturo Morton and Neurosurgeon [name], M.D.. The high doses associated with stereotactic radiotherapy and the significant potential risks require careful treatment set-up and patient monitoring constituting a special treatment procedure. Stereotactic Treatment Management: Following delivery, the patient was evaluated clinically. The patient tolerated treatment without significant acute effects, and was discharged to home in stable condition. Plan: Continue treatment as planned. Page 1/1 Electronically

## 2022-07-18 NOTE — Addendum Note (Signed)
Encounter addended by: [name], MD on: 07/18/2022 8:22 PM  Actions taken: Clinical Note Signed

## 2022-07-25 ENCOUNTER — Other Ambulatory Visit: Payer: Self-pay | Admitting: Radiation Oncology

## 2022-07-25 ENCOUNTER — Telehealth: Payer: Self-pay

## 2022-07-25 DIAGNOSIS — D420 Neoplasm of uncertain behavior of cerebral meninges: Secondary | ICD-10-CM

## 2022-07-25 MED ORDER — MORPHINE SULFATE (CONCENTRATE) 10 MG /0.5 ML PO SOLN: 5.0000 mg | ORAL | 0 refills | Status: DC | PRN

## 2022-07-25 MED ORDER — HYDROCODONE-ACETAMINOPHEN 7.5-325 MG/15ML PO SOLN: 10.0000 mL | ORAL | 0 refills | Status: AC | PRN

## 2022-07-25 NOTE — Telephone Encounter (Signed)
Various head pain -top and sides of head  -throbbing and intermittent/sharp and intermittent  -90% scalp 10% internal -Experiencing for approx two or so weeks. Pt would appreciate call back with advice for next steps.

## 2022-07-25 NOTE — Telephone Encounter (Signed)
Called and spoke with patient to let him know Dr. Isidore Moos was aware of his symptoms. Informed him that Dr. Isidore Moos felt he was experiencing progressive scalp and dural disease, (which is something they'd discussed was likely in past appointments). She offered to send a prescription pain medication to his pharmacy of choice to help provide relief. Patient accepted and requested that [facility] [address]. Advised patient to pick up a stool softener and/or Miralax to take while utilizing the prescription pain medication to help prevent constipation. Encouraged patient to call me back if he is not experiencing relief over the weekend so I could let Dr. Mickeal Skinner or Lexine Baton Pickenpack-Cousar-NP know since Dr. Isidore Moos will be off next week.   Patient verbalized understnading and appreciation of call. No other needs identifed

## 2022-07-25 NOTE — Telephone Encounter (Signed)
[facility]ies to check if they had liquid morphine in stock. Was told no locations currently had medication, but patient's preferred pharmacy did have 7.5-325 liquid hydrocodone-acetaminophen. Updated Dr. Isidore Moos who canceled morphine prescription and sent new prescription in for hydrocodone-acetaminophen. Called and spoke with pharmacist who confirmed that prescription was received, but stated they did not have the full quantity so another prescription would need to be sent in a later date to fill remaining quantity.   Called and spoke with patient to let him know above information. Advised him to call me or Mont Dutton when he was 1-2 days from running out of partially filled prescription so a new script could be sent. Patient verbalized understanding and agreement, and expressed appreciation of call. No other needs identified

## 2022-08-05 ENCOUNTER — Telehealth: Payer: Self-pay

## 2022-08-05 ENCOUNTER — Encounter: Payer: Self-pay | Admitting: Physical Medicine & Rehabilitation

## 2022-08-05 NOTE — Telephone Encounter (Signed)
For 7 plus days Mr. Fenter has c/o increased right knee spasms day & night. They occur around the knee and to the back of knee. There is some swelling to the right of the knee cap. No warmth or redness to the area.   Patient is bed bound and has an appointment at the [facility] tomorrow.  Incase you were thinking about ordering images.   Per wife on May 13 ED visit he had a blood clot in his urethra.   Please call Mrs. Bellerose to discuss in more details. The couple stated they do value your opinion. Call back phone [phone].  (Caller has been informed that Dr. Naaman Plummer is not present in the office).

## 2022-08-06 ENCOUNTER — Encounter: Payer: Self-pay | Admitting: Radiation Oncology

## 2022-08-06 ENCOUNTER — Inpatient Hospital Stay: Payer: Medicare Other | Attending: Internal Medicine

## 2022-08-06 ENCOUNTER — Inpatient Hospital Stay: Payer: Medicare Other

## 2022-08-06 ENCOUNTER — Ambulatory Visit
Admission: RE | Admit: 2022-08-06 | Discharge: 2022-08-06 | Disposition: A | Discharge status: Home / Self Care | Payer: Medicare Other | Source: Ambulatory Visit | Attending: Radiation Oncology | Admitting: Radiation Oncology

## 2022-08-06 VITALS — BP 128/81 | HR 80 | Temp 97.7°F | Resp 20 | Ht 69.0 in

## 2022-08-06 DIAGNOSIS — D320 Benign neoplasm of cerebral meninges: Secondary | ICD-10-CM | POA: Diagnosis present

## 2022-08-06 NOTE — Progress Notes (Signed)
James Olsen presents today for follow-up after completing radiation treatment to the scalp and brain on 06/27/2022  Reports he has not utilized hydrocodone prescription yet, and it managing discomfort with OTC acetaminophen. States nodules on scalp are no longer tender, but the ones on the left side often weep (denies bloody drainage). Reports increased weakness to his right side (states he's not able to use his right arm hardly at all), as well as an uncontrollable shaking/tapping to his left foot when he is feeling anxious/overwhelmed. Denies new or worsening vision or auditory concerns. Continues to struggle with finding his words and delayed responses when answering. Wife reports they have daily assistance through [place] during the week and weekend that has been very helpful.

## 2022-08-06 NOTE — Progress Notes (Signed)
Radiation Oncology         [phone] ________________________________  Name: James Olsen MRN: [id number]  Date: 08/06/2022  DOB: 17-Mar-1946  Follow-Up Visit Note  Outpatient  CC: Allwardt, Randa Evens, PA-C  [name], MD  Diagnosis and Prior Radiotherapy:    ICD-10-CM   1. 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 States nodules on scalp are no longer tender, but the ones on the left side often weep (denies bloody drainage). Reports increased weakness to his right side (states he's not able to use his right arm hardly at all), as well as an uncontrollable shaking/tapping to his left foot when he is feeling anxious/overwhelmed. Denies new or worsening vision or auditory concerns. Continues to struggle with finding his words and delayed responses when answering. Wife reports they have daily assistance through [place] during the week and weekend that has been very helpful.    Denies any visual changes or severe uncontrolled headaches.           ALLERGIES:  has No Known Allergies.  Meds: Current Outpatient Medications  Medication Sig Dispense Refill   acetaminophen (TYLENOL) 500 MG tablet Take 1,000 mg by mouth daily as needed for moderate pain.     baclofen (LIORESAL) 10 MG tablet TAKE 1 TABLET BY MOUTH THREE TIMES A DAY AND 1-2 TABLETS AT NIGHT 120 tablet 1   HYDROcodone-acetaminophen (HYCET) 7.5-325 mg/15 ml solution Take 10-15 mLs by mouth every 4 (four) hours as needed for moderate pain. Take with food. 473 mL 0   levETIRAcetam (KEPPRA) 500 MG tablet Take 2 tablets (1,000 mg total) by mouth 2 (two) times daily. 120 tablet 3   LORazepam (ATIVAN) 1 MG  tablet Take 1 tablet 30 minutes before MRIs and/or radiation procedures. Take two tablets if absolutely necessary for claustrophobia. Must have driver after taking medication. 16 tablet 0   magnesium gluconate (MAGONATE) 500 MG tablet Take 500 mg by mouth 2 (two) times daily. Lunch & Evening     Multiple Vitamins-Minerals (PRESERVISION AREDS 2) CAPS Take 1 capsule by mouth 2 (two) times daily.      silodosin (RAPAFLO) 8 MG CAPS capsule Take 8 mg by mouth daily with breakfast.     traZODone (DESYREL) 50 MG tablet TAKE 1 TABLET BY MOUTH EVERYDAY AT BEDTIME 90 tablet 1   Vitamin D, Ergocalciferol, (DRISDOL) 1.25 MG (50000 UNIT) CAPS capsule Take 1 capsule (50,000 Units total) by mouth every 7 (seven) days. 12 capsule 0   No current facility-administered medications for this encounter.    Physical Findings: The patient is in no acute distress. Patient is alert and oriented.  height is '5\' 9"'$  (1.753 m). His oral temperature is 97.7 F (36.5 C). His blood pressure is 128/81 and his pulse is 80. His respiration is 20 and oxygen saturation is 100%. .    General: Alert and oriented, in no acute distress.  He is sitting in wheelchair. HEENT: Scalp notable for multiple nodules.  Some of the ones that were previously treated are crusted over.    Extremities: Bilateral lower extremity edema.  No tenderness to palpation of his calves.  Right knee is more swollen than left knee Skin:  Nodules on scalp as above Musculoskeletal: He is diffusely weak.  He is most weak in his right arm.  Right leg is slightly weaker than left.   Neurologic:  He has some word finding difficulty but is still an effective communicator.  He is alert and oriented.  Tremors noted in legs Psychiatric: Judgment and insight are intact. Affect is appropriate.   KPS = 40  100 - Normal; no complaints; no evidence of disease. 90   - Able to carry on normal activity; minor signs or symptoms of disease. 80   - Normal activity with effort;  some signs or symptoms of disease. 66   - Cares for self; unable to carry on normal activity or to do active work. 60   - Requires occasional assistance, but is able to care for most of his personal needs. 50   - Requires considerable assistance and frequent medical care. 57   - Disabled; requires special care and assistance. 63   - Severely disabled; hospital admission is indicated although death not imminent. 71   - Very sick; hospital admission necessary; active supportive treatment necessary. 10   - Moribund; fatal processes progressing rapidly. 0     - Dead  Karnofsky DA, Abelmann [place], Craver LS and Burchenal [facility] [phone]) The use of the nitrogen mustards in the palliative treatment of carcinoma: with particular reference to bronchogenic carcinoma Cancer 1 634-56    Lab Findings: Lab Results  Component Value Date   WBC 5.7 06/02/2022   HGB 13.9 06/02/2022   HCT 41.9 06/02/2022   MCV 94.7 06/02/2022   PLT 230.0 06/02/2022    Radiographic Findings: No results found.  Impression/Plan:   I had a discussion with the patient and his wife today about future plans.  He states is not interested in hospice services-he and his wife are pleased with the care they are getting right now through home health. They know that his disease is not curable and that focusing on quality of life is important.  They also understand that getting frequent MRIs will probably not improve his quality of life or longevity.  Tentatively he had plans to get an MRI of his brain this fall.  However it is cumbersome for him to come in for the studies.  We talked about an alternative of getting MRIs only as needed based on his symptoms versus pushing an MRI back to after the holidays, such as January 2024.  They would like to go ahead and get the scan in January.  They request that MRI be done by [facility] imaging on [address]. I will see him after that study.  In the interim, sometime between now and then, they would like a  telemedicine follow-up with Dr. Mickeal Skinner.  I will ask our navigator to organize this.  He will continue to focus on his comfort with excellent home health care.  He knows to call us if he has any issues in the interim.  On date of service, in total, I spent 30 minutes on this encounter. Patient was seen in person.  _____________________________________   Eppie Gibson, MD

## 2022-08-09 ENCOUNTER — Other Ambulatory Visit: Payer: Self-pay | Admitting: Physician Assistant

## 2022-08-11 ENCOUNTER — Encounter: Payer: Self-pay | Admitting: Internal Medicine

## 2022-08-11 NOTE — Progress Notes (Signed)
                                                                                                                                                             Patient Name: James Olsen MRN: [id number] DOB: 1946/02/25 Referring Physician: Theresa Duty Date of Service: 06/27/2022 [facility] [place]                                                        End Of Treatment Note  Diagnoses: C70.0-Malignant neoplasm of cerebral meninges  Intent: Palliative  Radiation Treatment Dates: 06/17/2022 through 06/27/2022 Site Technique Total Dose (Gy) Dose per Fx (Gy) Completed Fx Beam Energies  Brain: Brain_salvag2 IMRT 30/30 6 5/5 6XFFF  Scalp: HN_L_scalp specialPort 30/30 6 5/5 6E   Narrative: The patient tolerated radiation therapy relatively well.   Plan: The patient will follow-up with radiation oncology in 99mo. -----------------------------------  SEppie Gibson MD

## 2022-08-13 ENCOUNTER — Encounter (INDEPENDENT_AMBULATORY_CARE_PROVIDER_SITE_OTHER): Payer: Medicare Other | Admitting: Ophthalmology

## 2022-08-19 ENCOUNTER — Telehealth: Payer: Self-pay | Admitting: Physician Assistant

## 2022-08-19 NOTE — Telephone Encounter (Signed)
Please see [facility]ba orders request and advise

## 2022-08-19 NOTE — Telephone Encounter (Signed)
..  Home Health Verbal Orders  Agency:  [facility] [place]  Caller: Sam  Contact and title [phone] /  PT  Requesting Speech:    Reason for Request:  Patient having a hard time verbalizing what he is trying to say  Frequency:  Evaluation  HH needs F2F w/in last 30 days

## 2022-08-19 NOTE — Telephone Encounter (Signed)
Returned call to see if virtual appt would suffice since pt is bed bound with terminal illness; Per [facility] Speech therapist no F2F needed or was never stated to be needed on previous phone call. Verbal order was given per Alyssa's recommendations.

## 2022-08-20 ENCOUNTER — Encounter (INDEPENDENT_AMBULATORY_CARE_PROVIDER_SITE_OTHER): Payer: Self-pay

## 2022-08-20 ENCOUNTER — Encounter (INDEPENDENT_AMBULATORY_CARE_PROVIDER_SITE_OTHER): Payer: Medicare Other | Admitting: Ophthalmology

## 2022-08-21 ENCOUNTER — Telehealth: Payer: Self-pay | Admitting: Physician Assistant

## 2022-08-21 ENCOUNTER — Other Ambulatory Visit: Payer: Self-pay | Admitting: Internal Medicine

## 2022-08-21 NOTE — Telephone Encounter (Signed)
..  Home Health Verbal Orders  Agency:  [facility]  Caller: Sonia Baller, Speech therapist   Requesting OT/ PT/ Skilled nursing/ Social Work/ Speech:  Speech therapist  Reason for Request:  Cognitive communication deficit   Frequency:  Once a week x 4 weeks   Verbal orders can be communicated to [phone]

## 2022-08-22 ENCOUNTER — Encounter: Payer: Self-pay | Admitting: Internal Medicine

## 2022-08-22 NOTE — Telephone Encounter (Signed)
Returned [employer] call and lvm with ok to verbal orders per Alyssa's recommendations.

## 2022-08-22 NOTE — Telephone Encounter (Signed)
Please advise 

## 2022-09-01 ENCOUNTER — Encounter: Payer: Self-pay | Admitting: Internal Medicine

## 2022-09-02 ENCOUNTER — Other Ambulatory Visit: Payer: Self-pay | Admitting: *Deleted

## 2022-09-02 DIAGNOSIS — D420 Neoplasm of uncertain behavior of cerebral meninges: Secondary | ICD-10-CM

## 2022-09-04 ENCOUNTER — Other Ambulatory Visit: Payer: Self-pay | Admitting: Radiation Therapy

## 2022-09-08 ENCOUNTER — Telehealth: Payer: Self-pay | Admitting: Physician Assistant

## 2022-09-08 NOTE — Telephone Encounter (Signed)
.  Home Health Certification or Plan of Care Tracking  Is this a Certification or Plan of Care? yes  [facility] Agency: [facility]  Order Number:  [id number]  Has charge sheet been attached? yes  Where has form been placed:  In provider's box  Faxed to:   [phone]

## 2022-09-09 ENCOUNTER — Encounter: Payer: Self-pay | Admitting: Internal Medicine

## 2022-09-10 ENCOUNTER — Telehealth: Payer: Self-pay | Admitting: Physician Assistant

## 2022-09-10 NOTE — Telephone Encounter (Signed)
Pt's spouse came into the office to let us know pt is getting worse with his mobility. We should be receiving paperwork from [employer] for a wheelchair for him. If he is needing a visit once we receive this, please give spouse a call to schedule.

## 2022-09-10 NOTE — Telephone Encounter (Signed)
[place] cert paperwork in provider box awaiting signatures to be faxed back for patient

## 2022-09-11 NOTE — Telephone Encounter (Signed)
Spoke with Arbie Cookey at [facility] and got an alternate fax number, resent fax with office notes previously sent 09/04/22.

## 2022-09-11 NOTE — Telephone Encounter (Signed)
Caller States: -received the "seating evaluation order"   Caller Requests: -Chart Notes from last two visits  Fax: [phone] If busy, use [phone]

## 2022-09-12 NOTE — Telephone Encounter (Signed)
Returned fax 09/12/2022

## 2022-09-15 ENCOUNTER — Encounter: Payer: Self-pay | Admitting: *Deleted

## 2022-09-19 ENCOUNTER — Telehealth: Payer: Self-pay | Admitting: Physician Assistant

## 2022-09-19 NOTE — Telephone Encounter (Signed)
..  Type of form received: wheelchair signature document  Additional comments:   Received by: [employer]   Form should be Faxed RH:[phone] or [phone]  Form should be mailed to:  na   Is patient requesting call for pickup: na    Form placed:   Alyssa's folder   Attach charge sheet.  Yes   Individual made aware of 3-5 business day turn around (Y/N)?   NO

## 2022-09-19 NOTE — Telephone Encounter (Signed)
Forms placed in provider box for review and signature

## 2022-09-20 ENCOUNTER — Other Ambulatory Visit: Payer: Self-pay | Admitting: Physician Assistant

## 2022-09-23 NOTE — Telephone Encounter (Signed)
Faxed to [place] and confirmation of fax received and in my desk for our records.

## 2022-09-26 ENCOUNTER — Telehealth: Payer: Self-pay | Admitting: Physician Assistant

## 2022-09-26 ENCOUNTER — Ambulatory Visit (HOSPITAL_COMMUNITY)
Admission: RE | Admit: 2022-09-26 | Discharge: 2022-09-26 | Disposition: A | Discharge status: Home / Self Care | Payer: Medicare Other | Source: Ambulatory Visit | Attending: Internal Medicine | Admitting: Internal Medicine

## 2022-09-26 DIAGNOSIS — D420 Neoplasm of uncertain behavior of cerebral meninges: Secondary | ICD-10-CM | POA: Diagnosis present

## 2022-09-26 MED ORDER — GADOBUTROL 1 MMOL/ML IV SOLN
10.0000 mL | Freq: Once | INTRAVENOUS | Status: AC | PRN
  Administered 2022-09-26: 10 mL via INTRAVENOUS

## 2022-09-26 NOTE — Telephone Encounter (Signed)
..  Home Health Certification or Plan of Care Tracking  Is this a Certification or Plan of Care? yes  [place] Agency: [facility]  Order Number:  [phone]  Has charge sheet been attached? yes  Where has form been placed:  In provider's box  Faxed to:   [phone]

## 2022-09-26 NOTE — Telephone Encounter (Signed)
Forms in provider box awaiting signatures

## 2022-09-26 NOTE — Telephone Encounter (Signed)
Faxed forms to [facility] confirmation in my desk

## 2022-09-29 ENCOUNTER — Inpatient Hospital Stay (HOSPITAL_BASED_OUTPATIENT_CLINIC_OR_DEPARTMENT_OTHER): Payer: Medicare Other | Admitting: Internal Medicine

## 2022-09-29 ENCOUNTER — Inpatient Hospital Stay: Payer: Medicare Other | Attending: Internal Medicine

## 2022-09-29 VITALS — BP 116/69 | HR 78 | Temp 97.8°F | Resp 16 | Ht 69.0 in | Wt 187.7 lb

## 2022-09-29 DIAGNOSIS — Y842 Radiological procedure and radiotherapy as the cause of abnormal reaction of the patient, or of later complication, without mention of misadventure at the time of the procedure: Secondary | ICD-10-CM

## 2022-09-29 DIAGNOSIS — C713 Malignant neoplasm of parietal lobe: Secondary | ICD-10-CM | POA: Diagnosis present

## 2022-09-29 DIAGNOSIS — F419 Anxiety disorder, unspecified: Secondary | ICD-10-CM | POA: Insufficient documentation

## 2022-09-29 DIAGNOSIS — D420 Neoplasm of uncertain behavior of cerebral meninges: Secondary | ICD-10-CM

## 2022-09-29 DIAGNOSIS — I69351 Hemiplegia and hemiparesis following cerebral infarction affecting right dominant side: Secondary | ICD-10-CM

## 2022-09-29 DIAGNOSIS — I6789 Other cerebrovascular disease: Secondary | ICD-10-CM | POA: Diagnosis not present

## 2022-09-29 MED ORDER — TRAMADOL HCL 50 MG PO TABS: 50.0000 mg | ORAL_TABLET | Freq: Four times a day (QID) | ORAL | 1 refills | Status: AC | PRN

## 2022-09-29 MED ORDER — CLONAZEPAM 0.5 MG PO TABS: 0.5000 mg | ORAL_TABLET | Freq: Two times a day (BID) | ORAL | 0 refills | Status: AC

## 2022-09-29 MED ORDER — CYCLOBENZAPRINE HCL 10 MG PO TABS
10.0000 mg | ORAL_TABLET | Freq: Three times a day (TID) | ORAL | 0 refills | Status: AC | PRN
Start: 2022-09-29 — End: ?

## 2022-09-29 NOTE — Progress Notes (Signed)
[facility] [place], [place] [address] [phone]   Interval Evaluation  Date of Service: 09/29/22 Patient Name: James Olsen Patient MRN: [id number] Patient DOB: 01/04/1946 Provider: [name], MD  Identifying Statement:  James Olsen is a 76 y.o. 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  He and his wife describe marked decline in functional status.  This is characterized by dense right sided weakness with spasticity and pain from spasm, dense expressive language deficits.  In addition, he has been having headaches every day, quite severe.  Tylenol has helped only modestly.  Overall he needs assistance with all ALDs, and is in pain with spasm and headache most of the day.  Otherwise continues on Keppra '1000mg'$  twice per day, no recurrence of seizures.  Still has indwelling foley catheter as prior.  Getting to MRI and laying still in the table was extremely difficult.  H+P (01/20/19) Patient presented to medical attention in the winter of 2019 after noticing a "bulging" coming from the top of his head.  He also had experienced some numbness of his left leg at the time.  He obtained an MRI which demonstrated a dural based mass extending through the skull.  After a period of monitoring he underwent resection on 10/29/18 with Dr. Christella Noa.  Post-operatively he was very weak on the right side and was found to have a stroke.  After extensive rehabilitation he  regained most of his right sided function.  Because of residual tumor and histology (WHO II) radiation was started on 12/19 with Dr. Isidore Olsen.  Next week is his final session which he has tolerated without ill effect.  Medications: Current Outpatient Medications on File Prior to Visit  Medication Sig Dispense Refill   acetaminophen (TYLENOL) 500 MG tablet Take 1,000 mg by mouth daily as needed for moderate pain.     baclofen (LIORESAL) 10 MG tablet TAKE 1 TABLET BY MOUTH THREE TIMES A DAY AND 1-2 TABLETS AT NIGHT 120 tablet 1   HYDROcodone-acetaminophen (HYCET) 7.5-325 mg/15 ml solution Take 10-15 mLs by mouth every 4 (four) hours as needed for moderate pain. Take with food. 473 mL 0   levETIRAcetam (KEPPRA) 500 MG tablet TAKE 2 TABLETS BY MOUTH TWICE A DAY 360 tablet 1   LORazepam (ATIVAN) 1 MG tablet Take 1 tablet 30 minutes before MRIs and/or radiation procedures. Take two tablets if absolutely necessary for claustrophobia. Must have driver after taking medication. 16 tablet 0   magnesium gluconate (MAGONATE) 500 MG tablet Take 500 mg by mouth 2 (two) times daily. Lunch & Evening     Multiple Vitamins-Minerals (PRESERVISION AREDS 2) CAPS Take 1 capsule by mouth 2 (two) times daily.      silodosin (RAPAFLO) 8 MG CAPS capsule Take 8 mg by mouth daily with breakfast.     traZODone (DESYREL) 50 MG tablet TAKE 1 TABLET BY MOUTH EVERYDAY AT BEDTIME 90 tablet 1   Vitamin D, Ergocalciferol, (DRISDOL) 1.25 MG (50000 UNIT) CAPS capsule TAKE 1 CAPSULE (50,000 UNITS TOTAL) BY MOUTH EVERY 7 (SEVEN)  DAYS 12 capsule 0   No current facility-administered medications on file prior to visit.    Allergies: No Known Allergies Past Medical History:  Past Medical History:  Diagnosis Date   Allergy    Arthritis    Cataract    GERD (gastroesophageal reflux disease)    Glaucoma    pt denies.    History of blood transfusion     5 units after previous surgery   History of radiation therapy 12/09/18- 01/24/2019    Left sagittal brain, 11.8 Gy in 31 fractions for a total dose of 55.8 Gy   Hyperlipidemia    Macular degeneration    Seizures ([place])    Stroke [facility])    after surgery in August 2019 mostly recovered still some weakness in left arm   Wears glasses    Past Surgical History:  Past Surgical History:  Procedure Laterality Date   APPLICATION OF CRANIAL NAVIGATION N/A 06/21/2021   Procedure: APPLICATION OF CRANIAL NAVIGATION;  Surgeon: [name], MD;  Location: [facility];  Service: Neurosurgery;  Laterality: N/A;   BRAIN SURGERY     CHOLECYSTECTOMY  2008   lap choli   COLONOSCOPY     CRANIOPLASTY N/A 10/29/2018   Procedure: CRANIOPLASTY;  Surgeon: [name], MD;  Location: [facility];  Service: Neurosurgery;  Laterality: N/A;  CRANIOPLASTY   CRANIOTOMY N/A 08/18/2018   Procedure: CRANIOTOMY TUMOR EXCISION;  Surgeon: [name], MD;  Location: [facility];  Service: Neurosurgery;  Laterality: N/A;  CRANIOTOMY TUMOR EXCISION   CRANIOTOMY Bilateral 06/21/2021   Procedure: Bilateral Craniotomy for tumor resection;  Surgeon: [name], MD;  Location: [facility];  Service: Neurosurgery;  Laterality: Bilateral;   DIAGNOSTIC LAPAROSCOPY     EYE SURGERY     laser surgery to relieve pressure   NAILBED REPAIR Left 05/04/2014   Procedure: LEFT INDEX NAIL ABLATION V-Y FLAP COVERAGE;  Surgeon: [name]., MD;  Location: [facility];  Service: Orthopedics;  Laterality: Left;  index   PILONIDAL CYST EXCISION     x2   TONSILLECTOMY     VASECTOMY     Social History:  Social History   Socioeconomic History   Marital status: Married    Spouse name: Not on file   Number of children: Not on file   Years of education: Not on file   Highest education level: Not on file  Occupational History   Not on file  Tobacco Use   Smoking status: Never   Smokeless tobacco: Never  Vaping Use   Vaping Use: Never used  Substance and Sexual Activity   Alcohol use: Yes    Comment: occasionally   Drug  use: No   Sexual activity: Not Currently  Other Topics Concern   Not on file  Social History Narrative   Not on file   Social Determinants of Health   Financial Resource Strain: Not on file  Food Insecurity: Not on file  Transportation Needs: No Transportation Needs (11/10/2018)   PRAPARE - Hydrologist (Medical): No    Lack of Transportation (Non-Medical): No  Physical Activity: Not on file  Stress: Not on file  Social Connections: Not on file  Intimate Partner Violence: Not At Risk (11/10/2018)   Humiliation, Afraid, Rape, and Kick questionnaire    Fear of Current or Ex-Partner: No    Emotionally Abused: No    Physically Abused: No    Sexually Abused: No   Family History:  Family  History  Problem Relation Age of Onset   Diabetes Mother    Skin cancer Brother    Congenital heart disease Brother     Review of Systems: Constitutional: Denies fevers, chills or abnormal weight loss Eyes: Denies blurriness of vision Ears, nose, mouth, throat, and face: Denies mucositis or sore throat Respiratory: Denies cough, dyspnea or wheezes Cardiovascular: Denies palpitation, chest discomfort or lower extremity swelling Gastrointestinal:  Denies nausea, constipation, diarrhea GU: foley Skin: Denies abnormal skin rashes Neurological: Per HPI Musculoskeletal: Denies joint pain, back or neck discomfort. No decrease in ROM Behavioral/Psych: Denies anxiety, disturbance in thought content, and mood instability  Physical Exam: Vitals:   09/29/22 1219  BP: 116/69  Pulse: 78  Resp: 16  Temp: 97.8 F (36.6 C)  SpO2: 98%    KPS: 50 General: Alert, cooperative, pleasant, in no acute distress Head: Excessive granulation tissue along resection cavity and radiation treatment sites, non purulent. EENT: No conjunctival injection or scleral icterus. Oral mucosa moist Lungs: Resp effort increased Cardiac: Regular rate and rhythm Abdomen: Soft, non-distended  abdomen Skin: No rashes cyanosis or petechiae. Extremities: normal  Neurologic Exam: Mental Status: Awake, alert, attentive to examiner. Oriented to self and environment. Language is noted for dense expressive dysphasia with intact comprehension.  Cranial Nerves: Visual acuity is grossly normal. Visual fields are full. Extra-ocular movements intact. No ptosis. Face is symmetric, tongue midline. Motor: Tone and bulk are normal. Power is 1/5 in right arm and leg. Reflexes are symmetric, no pathologic reflexes present. Intact finger to nose bilaterally Sensory: Intact to light touch and temperature Gait: Wheelchair  Labs: I have reviewed the data as listed    Component Value Date/Time   NA 137 06/02/2022 1150   K 4.0 06/02/2022 1150   CL 101 06/02/2022 1150   CO2 28 06/02/2022 1150   GLUCOSE 189 (H) 06/02/2022 1150   BUN 17 06/02/2022 1150   CREATININE 0.66 06/02/2022 1150   CREATININE 0.84 12/03/2021 1228   CALCIUM 9.3 06/02/2022 1150   PROT 6.4 06/02/2022 1150   ALBUMIN 3.5 06/02/2022 1150   AST 19 06/02/2022 1150   ALT 38 06/02/2022 1150   ALKPHOS 92 06/02/2022 1150   BILITOT 0.3 06/02/2022 1150   GFRNONAA >60 04/02/2022 0532   GFRNONAA >60 12/03/2021 1228   GFRAA >60 11/26/2018 1231   Lab Results  Component Value Date   WBC 5.7 06/02/2022   NEUTROABS 4.1 06/02/2022   HGB 13.9 06/02/2022   HCT 41.9 06/02/2022   MCV 94.7 06/02/2022   PLT 230.0 06/02/2022    Imaging:  MR Brain W Wo Contrast  Result Date: 09/27/2022 CLINICAL DATA:  Brain/CNS neoplasm, assess treatment response. Atypical meningioma. EXAM: MRI HEAD WITHOUT AND WITH CONTRAST TECHNIQUE: Multiplanar, multiecho pulse sequences of the brain and surrounding structures were obtained without and with intravenous contrast. CONTRAST:  32m GADAVIST GADOBUTROL 1 MMOL/ML IV SOLN COMPARISON:  MR head without and with contrast 06/05/2022 and 05/16/2022. FINDINGS: Brain: Postoperative changes are again noted. Midline  sagittal tumor demonstrates continued increase in size. Measurements on image 12 of series 11 demonstrate similar vertical thickness of the extracranial component, measuring 8.5 mm. The transverse diameter of the intracranial extra-axial component has increased from 20 mm to 27 mm. Progressive irregular dural thickening is concerning for tumor progression over the left parietal lobe measuring 1.5 x 4.6 cm on the sagittal series (12) no significant enhancement is present on the previous sagittal image. The extracranial tumor over the right parietal scalp with osseous involvement  is similar in size measuring 15.5 mm on image 13 of series 11. Widening of the inter cerebral falx is stable at 8 mm on image 14 of series 11. Extracranial lesion over the right frontal calvarium on image 18 of series 11 is similar in size at 16 mm maximally. Meningioma over the high left frontal lobe on coronal image 17 is stable in size measuring 17 x 10 x 15 mm. No acute cortical infarct is present. Extensive T2 signal changes are present. Some of this likely reflects chronic microvascular disease and previous treatment. More confluent T2 signal change over the high left frontal and parietal lobe and to a lesser extent the right frontal lobe likely represents edema from recurrent tumor. No acute hemorrhage is present. The internal auditory canals are within normal limits. The brainstem and cerebellum are within normal limits. Vascular: Flow is present in the major intracranial arteries. Skull and upper cervical spine: The craniocervical junction is normal. Upper cervical spine is within normal limits. Marrow signal is unremarkable. Sinuses/Orbits: The paranasal sinuses and mastoid air cells are clear. The globes and orbits are within normal limits. Other: IMPRESSION: 1. Continued increase in size of midline sagittal tumor. 2. Progressive irregular dural thickening over the left parietal lobe measuring 1.5 x 4.6 cm on the sagittal is also  consistent with tumor progression. 3. Stable extracranial tumor over the right parietal scalp with osseous involvement. 4. Extracranial lesion over the right frontal calvarium is similar in size at 16 mm maximally. 5. Meningioma over the high left frontal lobe is stable in size measuring 17 x 10 x 15 mm. 6. Progressive T2 signal changes over the anterior left frontal lobe consistent with increasing edema as the tumor progresses. Electronically Signed   By: San Morelle M.D.   On: 09/27/2022 11:43     Assessment/Plan 1. 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    Brain MRI unfortunately demonstrates multifocal progression of meningioma despite recent radiation therapy.   We had extensive goals of care discussion today.  Because of poor functional status and refractory behavior of tumor, our first recommendation was comfort care and hospice.    Beyond that, there could be role for further systemic therapy, though he is not candidate for cytotoxic chemotherapy. Would look into Everolimus and Octreotide based on recent CEVOREM trial data: https://aacrjournals.org/clincancerres/article/[email or web address]/[email or web address]/Everolimus-and-Octreotide-for-Patients-with  Recommended the following: -Dosing Tramadol '50mg'$  BID prn for headaches instead of Tylenol -PRN flexiril in addition to standing baclofen '10mg'$  QID for severe spasticity -Standing Klonopin 0.'5mg'$  BID for spasticity and anxiety -Defer antibiotics given lack of purulence, warmth within soft tissue/scalp granulation  Keppra can also remain at '1000mg'$  BID for now.  James Olsen will go home and discuss goals of care with his family.  He will reach out to [place] with preferred path forward.  All questions were answered. The patient knows to call the clinic with any problems, questions or concerns. No barriers to learning were detected.  The total time  spent in the encounter was 40 minutes and more than 50% was on counseling and review of test results   [name], MD Medical Director of Neuro-Oncology [facility] at [place] 09/29/22 12:13 PM

## 2022-09-30 ENCOUNTER — Encounter: Payer: Self-pay | Admitting: Internal Medicine

## 2022-10-03 ENCOUNTER — Encounter: Payer: Self-pay | Admitting: Internal Medicine

## 2022-10-05 ENCOUNTER — Other Ambulatory Visit: Payer: Self-pay | Admitting: Physical Medicine & Rehabilitation

## 2022-10-05 DIAGNOSIS — I69351 Hemiplegia and hemiparesis following cerebral infarction affecting right dominant side: Secondary | ICD-10-CM

## 2022-10-05 DIAGNOSIS — D420 Neoplasm of uncertain behavior of cerebral meninges: Secondary | ICD-10-CM

## 2022-10-10 ENCOUNTER — Telehealth: Payer: Self-pay | Admitting: Physician Assistant

## 2022-10-10 NOTE — Telephone Encounter (Signed)
LVM for pt to rtn my call to schedule AWV-I with [facility]. Please schedule this appt if patient calls the office.

## 2022-10-16 ENCOUNTER — Telehealth: Payer: Self-pay | Admitting: *Deleted

## 2022-10-16 NOTE — Telephone Encounter (Signed)
Wife called to report that patient is now in [place].

## 2022-10-20 ENCOUNTER — Ambulatory Visit: Payer: Medicare Other | Admitting: Internal Medicine

## 2022-10-30 ENCOUNTER — Telehealth: Payer: Self-pay | Admitting: *Deleted

## 2022-11-21 NOTE — Telephone Encounter (Signed)
Patients wife called to advise that patient passed away this morning under hospice care.  She expressed thanks to the entire staff for all of the care and compassion that was provided to her husband during his illness.

## 2022-11-21 DEATH — deceased

## 2023-01-07 ENCOUNTER — Ambulatory Visit: Payer: Medicare Other | Admitting: Physical Medicine & Rehabilitation
